# Patient Record
Sex: Male | Born: 1945 | Race: White | Hispanic: No | Smoking: Former smoker
Health system: Southern US, Community
[De-identification: ages and names within clinical notes are randomized; demographics above are authoritative.]

## PROBLEM LIST (undated history)

## (undated) ENCOUNTER — Emergency Department (HOSPITAL_COMMUNITY): Payer: Medicare Other | Source: Home / Self Care

## (undated) DIAGNOSIS — J189 Pneumonia, unspecified organism: Secondary | ICD-10-CM

## (undated) DIAGNOSIS — M1990 Unspecified osteoarthritis, unspecified site: Secondary | ICD-10-CM

## (undated) DIAGNOSIS — I2510 Atherosclerotic heart disease of native coronary artery without angina pectoris: Secondary | ICD-10-CM

## (undated) DIAGNOSIS — I10 Essential (primary) hypertension: Secondary | ICD-10-CM

## (undated) DIAGNOSIS — F419 Anxiety disorder, unspecified: Secondary | ICD-10-CM

## (undated) DIAGNOSIS — E119 Type 2 diabetes mellitus without complications: Secondary | ICD-10-CM

## (undated) DIAGNOSIS — D473 Essential (hemorrhagic) thrombocythemia: Secondary | ICD-10-CM

## (undated) DIAGNOSIS — F329 Major depressive disorder, single episode, unspecified: Secondary | ICD-10-CM

## (undated) DIAGNOSIS — G8929 Other chronic pain: Secondary | ICD-10-CM

## (undated) DIAGNOSIS — J90 Pleural effusion, not elsewhere classified: Secondary | ICD-10-CM

## (undated) DIAGNOSIS — M545 Low back pain, unspecified: Secondary | ICD-10-CM

## (undated) DIAGNOSIS — F32A Depression, unspecified: Secondary | ICD-10-CM

## (undated) DIAGNOSIS — C9590 Leukemia, unspecified not having achieved remission: Secondary | ICD-10-CM

## (undated) DIAGNOSIS — N400 Enlarged prostate without lower urinary tract symptoms: Secondary | ICD-10-CM

## (undated) DIAGNOSIS — Z87442 Personal history of urinary calculi: Secondary | ICD-10-CM

## (undated) DIAGNOSIS — D72829 Elevated white blood cell count, unspecified: Secondary | ICD-10-CM

## (undated) DIAGNOSIS — D75839 Thrombocytosis, unspecified: Secondary | ICD-10-CM

## (undated) DIAGNOSIS — N289 Disorder of kidney and ureter, unspecified: Secondary | ICD-10-CM

## (undated) DIAGNOSIS — K219 Gastro-esophageal reflux disease without esophagitis: Secondary | ICD-10-CM

## (undated) DIAGNOSIS — K2970 Gastritis, unspecified, without bleeding: Secondary | ICD-10-CM

## (undated) DIAGNOSIS — D469 Myelodysplastic syndrome, unspecified: Secondary | ICD-10-CM

## (undated) DIAGNOSIS — I509 Heart failure, unspecified: Secondary | ICD-10-CM

## (undated) DIAGNOSIS — C946 Myelodysplastic disease, not classified: Secondary | ICD-10-CM

## (undated) DIAGNOSIS — N186 End stage renal disease: Secondary | ICD-10-CM

## (undated) DIAGNOSIS — R161 Splenomegaly, not elsewhere classified: Secondary | ICD-10-CM

## (undated) HISTORY — PX: CYSTOSCOPY W/ STONE MANIPULATION: SHX1427

## (undated) HISTORY — PX: BONE MARROW BIOPSY: SHX199

## (undated) HISTORY — PX: BACK SURGERY: SHX140

---
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
## 1976-07-21 HISTORY — PX: LUMBAR DISC SURGERY: SHX700

## 1997-12-29 ENCOUNTER — Inpatient Hospital Stay (HOSPITAL_COMMUNITY): Admission: EM | Admit: 1997-12-29 | Discharge: 1997-12-30 | Payer: Self-pay | Admitting: Cardiology

## 2005-08-28 ENCOUNTER — Ambulatory Visit: Payer: Self-pay | Admitting: Cardiology

## 2006-12-15 ENCOUNTER — Ambulatory Visit: Payer: Self-pay | Admitting: Cardiology

## 2007-06-05 ENCOUNTER — Ambulatory Visit: Payer: Self-pay | Admitting: Cardiology

## 2008-03-26 ENCOUNTER — Emergency Department (HOSPITAL_COMMUNITY): Admission: EM | Admit: 2008-03-26 | Discharge: 2008-03-26 | Payer: Self-pay | Admitting: Emergency Medicine

## 2011-04-23 LAB — CBC
HCT: 36.7 — ABNORMAL LOW
Platelets: 311
RDW: 23.9 — ABNORMAL HIGH

## 2011-04-23 LAB — URINALYSIS, ROUTINE W REFLEX MICROSCOPIC
Ketones, ur: NEGATIVE
Leukocytes, UA: NEGATIVE
Nitrite: NEGATIVE
Specific Gravity, Urine: <1.005 — ABNORMAL LOW
pH: 6

## 2011-04-23 LAB — COMPREHENSIVE METABOLIC PANEL
Albumin: 4.1
Alkaline Phosphatase: 40
BUN: 12
Potassium: 3.9
Total Protein: 5.9 — ABNORMAL LOW

## 2011-04-23 LAB — URINE MICROSCOPIC-ADD ON

## 2011-04-23 LAB — DIFFERENTIAL
Basophils Relative: 1
Lymphocytes Relative: 18
Lymphs Abs: 1.7
Monocytes Absolute: 0.7
Monocytes Relative: 7
Neutro Abs: 6.7

## 2011-05-23 DIAGNOSIS — R079 Chest pain, unspecified: Secondary | ICD-10-CM

## 2011-12-19 ENCOUNTER — Encounter: Payer: Medicare Other | Admitting: Internal Medicine

## 2011-12-19 DIAGNOSIS — D473 Essential (hemorrhagic) thrombocythemia: Secondary | ICD-10-CM

## 2011-12-19 DIAGNOSIS — K7689 Other specified diseases of liver: Secondary | ICD-10-CM

## 2011-12-19 DIAGNOSIS — R161 Splenomegaly, not elsewhere classified: Secondary | ICD-10-CM

## 2012-03-05 ENCOUNTER — Ambulatory Visit (HOSPITAL_COMMUNITY)
Admission: RE | Admit: 2012-03-05 | Discharge: 2012-03-05 | Disposition: A | Discharge status: Home / Self Care | Payer: Medicare Other | Source: Ambulatory Visit | Attending: Emergency Medicine | Admitting: Emergency Medicine

## 2012-03-05 ENCOUNTER — Other Ambulatory Visit (HOSPITAL_COMMUNITY): Payer: Self-pay | Admitting: Emergency Medicine

## 2012-03-05 ENCOUNTER — Encounter (HOSPITAL_COMMUNITY): Payer: Self-pay

## 2012-03-05 DIAGNOSIS — R079 Chest pain, unspecified: Secondary | ICD-10-CM

## 2012-03-05 DIAGNOSIS — R0602 Shortness of breath: Secondary | ICD-10-CM | POA: Insufficient documentation

## 2012-03-05 DIAGNOSIS — R1031 Right lower quadrant pain: Secondary | ICD-10-CM | POA: Insufficient documentation

## 2012-03-05 MED ORDER — IOHEXOL 350 MG/ML SOLN
100.0000 mL | Freq: Once | INTRAVENOUS | Status: AC | PRN
  Administered 2012-03-05: 100 mL via INTRAVENOUS

## 2013-01-18 ENCOUNTER — Encounter: Payer: Self-pay | Admitting: Gastroenterology

## 2013-01-18 ENCOUNTER — Other Ambulatory Visit: Payer: Self-pay | Admitting: Gastroenterology

## 2013-01-18 ENCOUNTER — Ambulatory Visit (INDEPENDENT_AMBULATORY_CARE_PROVIDER_SITE_OTHER): Payer: Medicare Other | Admitting: Gastroenterology

## 2013-01-18 VITALS — BP 145/79 | HR 63 | Temp 97.9°F | Ht 72.0 in | Wt 208.4 lb

## 2013-01-18 DIAGNOSIS — K219 Gastro-esophageal reflux disease without esophagitis: Secondary | ICD-10-CM

## 2013-01-18 DIAGNOSIS — Z1211 Encounter for screening for malignant neoplasm of colon: Secondary | ICD-10-CM

## 2013-01-18 MED ORDER — PEG 3350-KCL-NA BICARB-NACL 420 G PO SOLR: 4000.0000 mL | ORAL | Status: DC

## 2013-01-18 NOTE — Progress Notes (Signed)
Primary Care Physician:  [name], MD Primary Gastroenterologist:  Dr. Oneida Alar   Chief Complaint  Patient presents with  . Colonoscopy    HPI:   [name] is a 67 year old male presenting today for a visit prior to screening colonoscopy. No prior colonoscopy. Notes right-sided abdominal pain, located more to the right of the abdomen, intermittent but usually present every day. Dragging him down. Present for about 2-3 years. Worsened by eating at times. Feels bloated. Can't pinpoint which foods aggravate him. No N/V. Takes Prilosec BID every day. Chronic GERD. History of remote dysphagia, with remote dilation by Dr. Duffy Rhody. No problems since. No melena. No hematochezia. About 3-4 times a month has abdominal cramping followed by loose stool. Lately abdominal pain unrelated to bowel habits. Takes pain medication for back, nothing really relieves abdominal pain. Gallbladder remains in situ.   CBC, TSH, lipid panel, CMP ordered by PCP; patient has not completed yet.   Past Medical History  Diagnosis Date  . Diabetes mellitus   . Hypertension   . Chronic back pain     Past Surgical History  Procedure Laterality Date  . Back surgery      Current Outpatient Prescriptions  Medication Sig Dispense Refill  . ALPRAZolam (XANAX) 0.5 MG tablet Take 0.5 mg by mouth at bedtime as needed.       Marland Kitchen FLUoxetine (PROZAC) 40 MG capsule Take 40 mg by mouth daily.       Marland Kitchen HYDROmorphone (DILAUDID) 4 MG tablet Take 4 mg by mouth every 4 (four) hours as needed.       . metFORMIN (GLUCOPHAGE) 500 MG tablet Take 1,000 mg by mouth 2 (two) times daily with a meal.       . omeprazole (PRILOSEC) 20 MG capsule Take 20 mg by mouth daily.        No current facility-administered medications for this visit.    Allergies as of 01/18/2013  . (No Known Allergies)    Family History  Problem Relation Age of Onset  . Colon cancer Neg Hx     History   Social History  . Marital Status: Divorced   Spouse Name: N/A    Number of Children: N/A  . Years of Education: N/A   Occupational History  . retired     Architect   Social History Main Topics  . Smoking status: Current Some Day Smoker    Types: Cigars  . Smokeless tobacco: Not on file  . 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 Drug Use: No  . Sexually Active: Not on file   Other Topics Concern  . Not on file   Social History Narrative  . No narrative on file    Review of Systems: Negative unless mentioned in HPI.   Physical Exam: BP 145/79  Pulse 63  Temp(Src) 97.9 F (36.6 C) (Oral)  Ht 6' (1.829 m)  Wt 208 lb 6.4 oz (94.53 kg)  BMI 28.26 kg/m2 General:   Alert and oriented. Pleasant and cooperative. Well-nourished and well-developed.  Head:  Normocephalic and atraumatic. Eyes:  Without icterus, sclera clear and conjunctiva pink.  Ears:  Normal auditory acuity. Nose:  No deformity, discharge,  or lesions. Mouth:  No deformity or lesions, oral mucosa pink.  Neck:  Supple, without mass or thyromegaly. Lungs:  Clear to auscultation bilaterally. No wheezes, rales, or rhonchi. No distress.  Heart:  S1, S2 present without murmurs  appreciated.  Abdomen:  +BS, soft, non-tender and non-distended. No HSM noted. No guarding or rebound. No masses appreciated.  Rectal:  Deferred  Msk:  Symmetrical without gross deformities. Normal posture. Extremities:  Without clubbing or edema. Neurologic:  Alert and  oriented x4;  grossly normal neurologically. Skin:  Intact without significant lesions or rashes. Cervical Nodes:  No significant cervical adenopathy. Psych:  Alert and cooperative. Normal mood and affect.

## 2013-01-18 NOTE — Patient Instructions (Addendum)
Please complete the blood work ordered by Dr. Manuella Ghazi.   We have scheduled you for a colonoscopy and upper endoscopy with Dr. Oneida Alar in the near future.  Further blood work and imaging may be necessary once these procedures are done.

## 2013-01-19 ENCOUNTER — Encounter (HOSPITAL_COMMUNITY): Payer: Self-pay

## 2013-01-21 DIAGNOSIS — K219 Gastro-esophageal reflux disease without esophagitis: Secondary | ICD-10-CM | POA: Insufficient documentation

## 2013-01-21 DIAGNOSIS — Z86010 Personal history of colonic polyps: Secondary | ICD-10-CM | POA: Insufficient documentation

## 2013-01-21 DIAGNOSIS — Z860101 Personal history of adenomatous and serrated colon polyps: Secondary | ICD-10-CM | POA: Insufficient documentation

## 2013-01-21 NOTE — Assessment & Plan Note (Signed)
67 year old male presenting with need for initial screening colonoscopy, reporting chronic right-sided abdominal discomfort for the past few years. Associated with bloating, at times exacerbated by eating. No significant change in bowel habits, although he does relate symptoms likely secondary to IBS to include occasional abdominal cramping and loose stool several times a month. No rectal bleeding. With his chronic back pain, question referred pain, IBS, less likely biliary etiology. Will proceed with colonoscopy and upper endoscopy in the near future. With his history of chronic GERD and vague RUQ discomfort, EGD will be performed to screen for Barrett's.   Proceed with colonoscopy/EGD with Dr. Oneida Alar in the near future. The risks, benefits, and alternatives have been discussed in detail with the patient. They state understanding and desire to proceed.  PROPOFOL due to chronic narcotics Obtain outside labs from PCP Consider [place] of abdomen if negative TCS/EGD

## 2013-01-21 NOTE — Assessment & Plan Note (Signed)
Continue BID PPI. EGD for Barrett's screening and chronic RUQ pain.

## 2013-01-24 NOTE — Progress Notes (Signed)
Cc PCP 

## 2013-01-24 NOTE — Patient Instructions (Addendum)
[name]  01/24/2013   Your procedure is scheduled on:  02/01/2013  Report to [facility] at  615  AM.  Call this number if you have problems the morning of surgery: [phone]   Remember:   Do not eat food or drink liquids after midnight.   Take these medicines the morning of surgery with A SIP OF WATER:  Xanax, prozac, dilaudid, prilosec   Do not wear jewelry, make-up or nail polish.  Do not wear lotions, powders, or perfumes.  Do not shave 48 hours prior to surgery. Men may shave face and neck.  Do not bring valuables to the hospital.  [facility] is not responsible for any belongings or valuables.  Contacts, dentures or bridgework may not be worn into surgery.  Leave suitcase in the car. After surgery it may be brought to your room.  For patients admitted to the hospital, checkout time is 11:00 AM the day of discharge.   Patients discharged the day of surgery will not be allowed to drive home.  Name and phone number of your driver: family  Special Instructions: N/A   Please read over the following fact sheets that you were given: Pain Booklet, Coughing and Deep Breathing, MRSA Information, Surgical Site Infection Prevention, Anesthesia Post-op Instructions and Care and Recovery After Surgery Esophagogastroduodenoscopy Esophagogastroduodenoscopy (EGD) is a procedure to examine the lining of the esophagus, stomach, and first part of the small intestine (duodenum). A long, flexible, lighted tube with a camera attached (endoscope) is inserted down the throat to view these organs. This procedure is done to detect problems or abnormalities, such as inflammation, bleeding, ulcers, or growths, in order to treat them. The procedure lasts about 5 20 minutes. It is usually an outpatient procedure, but it may need to be performed in emergency cases in the hospital. LET YOUR CAREGIVER KNOW ABOUT:   Allergies to food or medicine.  All medicines you are taking, including vitamins,  herbs, eyedrops, and over-the-counter medicines and creams.  Use of steroids (by mouth or creams).  Previous problems you or members of your family have had with the use of anesthetics.  Any blood disorders you have.  Previous surgeries you have had.  Other health problems you have.  Possibility of pregnancy, if this applies. RISKS AND COMPLICATIONS  Generally, EGD is a safe procedure. However, as with any procedure, complications can occur. Possible complications include:  Infection.  Bleeding.  Tearing (perforation) of the esophagus, stomach, or duodenum.  Difficulty breathing or not being able to breath.  Excessive sweating.  Spasms of the larynx.  Slowed heartbeat.  Low blood pressure. BEFORE THE PROCEDURE  Do not eat or drink anything for 6 8 hours before the procedure or as directed by your caregiver.  Ask your caregiver about changing or stopping your regular medicines.  If you wear dentures, be prepared to remove them before the procedure.  Arrange for someone to drive you home after the procedure. PROCEDURE   A vein will be accessed to give medicines and fluids. A medicine to relax you (sedative) and a pain reliever will be given through that access into the vein.  A numbing medicine (local anesthetic) may be sprayed on your throat for comfort and to stop you from gagging or coughing.  A mouth guard may be placed in your mouth to protect your teeth and to keep you from biting on the endoscope.  You will be asked to lie on your left  side.  The endoscope is inserted down your throat and into the esophagus, stomach, and duodenum.  Air is put through the endoscope to allow your caregiver to view the lining of your esophagus clearly.  The esophagus, stomach, and duodenum is then examined. During the exam, your caregiver may:  Remove tissue to be examined under a microscope (biopsy) for inflammation, infection, or other medical problems.  Remove  growths.  Remove objects (foreign bodies) that are stuck.  Treat any bleeding with medicines or other devices that stop tissues from bleeding (hot cauters, clipping devices).  Widen (dilate) or stretch narrowed areas of the esophagus and stomach.  The endoscope will then be withdrawn. AFTER THE PROCEDURE  You will be taken to a recovery area to be monitored. You will be able to go home once you are stable and alert.  Do not eat or drink anything until the local anesthetic and numbing medicines have worn off. You may choke.  It is normal to feel bloated, have pain with swallowing, or have a sore throat for a short time. This will wear off.  Your caregiver should be able to discuss his or her findings with you. It will take longer to discuss the test results if any biopsies were taken. Document Released: 11/07/2004 Document Revised: 06/23/2012 Document Reviewed: 06/09/2012 [facility] Patient Information 2014 [place], [place]. Colonoscopy A colonoscopy is an exam to evaluate your entire colon. In this exam, your colon is cleansed. A long fiberoptic tube is inserted through your rectum and into your colon. The fiberoptic scope (endoscope) is a long bundle of enclosed and very flexible fibers. These fibers transmit light to the area examined and send images from that area to your caregiver. Discomfort is usually minimal. You may be given a drug to help you sleep (sedative) during or prior to the procedure. This exam helps to detect lumps (tumors), polyps, inflammation, and areas of bleeding. Your caregiver may also take a small piece of tissue (biopsy) that will be examined under a microscope. LET YOUR CAREGIVER KNOW ABOUT:   Allergies to food or medicine.  Medicines taken, including vitamins, herbs, eyedrops, over-the-counter medicines, and creams.  Use of steroids (by mouth or creams).  Previous problems with anesthetics or numbing medicines.  History of bleeding problems or blood  clots.  Previous surgery.  Other health problems, including diabetes and kidney problems.  Possibility of pregnancy, if this applies. BEFORE THE PROCEDURE   A clear liquid diet may be required for 2 days before the exam.  Ask your caregiver about changing or stopping your regular medications.  Liquid injections (enemas) or laxatives may be required.  A large amount of electrolyte solution may be given to you to drink over a short period of time. This solution is used to clean out your colon.  You should be present 60 minutes prior to your procedure or as directed by your caregiver. AFTER THE PROCEDURE   If you received a sedative or pain relieving medication, you will need to arrange for someone to drive you home.  Occasionally, there is a little blood passed with the first bowel movement. Do not be concerned. FINDING OUT THE RESULTS OF YOUR TEST Not all test results are available during your visit. If your test results are not back during the visit, make an appointment with your caregiver to find out the results. Do not assume everything is normal if you have not heard from your caregiver or the medical facility. It is important for you to follow  up on all of your test results. HOME CARE INSTRUCTIONS   It is not unusual to pass moderate amounts of gas and experience mild abdominal cramping following the procedure. This is due to air being used to inflate your colon during the exam. Walking or a warm pack on your belly (abdomen) may help.  You may resume all normal meals and activities after sedatives and medicines have worn off.  Only take over-the-counter or prescription medicines for pain, discomfort, or fever as directed by your caregiver. Do not use aspirin or blood thinners if a biopsy was taken. Consult your caregiver for medicine usage if biopsies were taken. SEEK IMMEDIATE MEDICAL CARE IF:   You have a fever.  You pass large blood clots or fill a toilet with blood  following the procedure. This may also occur 10 to 14 days following the procedure. This is more likely if a biopsy was taken.  You develop abdominal pain that keeps getting worse and cannot be relieved with medicine. Document Released: 07/04/2000 Document Revised: 09/29/2011 Document Reviewed: 02/17/2008 [facility] Patient Information 2014 [place]. PATIENT INSTRUCTIONS POST-ANESTHESIA  IMMEDIATELY FOLLOWING SURGERY:  Do not drive or operate machinery for the first twenty four hours after surgery.  Do not make any important decisions for twenty four hours after surgery or while taking narcotic pain medications or sedatives.  If you develop intractable nausea and vomiting or a severe headache please notify your doctor immediately.  FOLLOW-UP:  Please make an appointment with your surgeon as instructed. You do not need to follow up with anesthesia unless specifically instructed to do so.  WOUND CARE INSTRUCTIONS (if applicable):  Keep a dry clean dressing on the anesthesia/puncture wound site if there is drainage.  Once the wound has quit draining you may leave it open to air.  Generally you should leave the bandage intact for twenty four hours unless there is drainage.  If the epidural site drains for more than 36-48 hours please call the anesthesia department.  QUESTIONS?:  Please feel free to call your physician or the hospital operator if you have any questions, and they will be happy to assist you.

## 2013-01-25 ENCOUNTER — Other Ambulatory Visit: Payer: Self-pay

## 2013-01-25 ENCOUNTER — Encounter (HOSPITAL_COMMUNITY): Payer: Self-pay

## 2013-01-25 ENCOUNTER — Encounter (HOSPITAL_COMMUNITY)
Admission: RE | Admit: 2013-01-25 | Discharge: 2013-01-25 | Disposition: A | Discharge status: Home / Self Care | Payer: Medicare Other | Source: Ambulatory Visit | Attending: Gastroenterology | Admitting: Gastroenterology

## 2013-01-25 HISTORY — DX: Anxiety disorder, unspecified: F41.9

## 2013-01-25 HISTORY — DX: Gastro-esophageal reflux disease without esophagitis: K21.9

## 2013-01-25 LAB — BASIC METABOLIC PANEL
CO2: 32 mEq/L (ref 19–32)
Chloride: 105 mEq/L (ref 96–112)
Potassium: 4.9 mEq/L (ref 3.5–5.1)
Sodium: 141 mEq/L (ref 135–145)

## 2013-01-25 LAB — HEMOGLOBIN AND HEMATOCRIT, BLOOD: HCT: 34.6 % — ABNORMAL LOW (ref 39.0–52.0)

## 2013-01-26 ENCOUNTER — Telehealth: Payer: Self-pay | Admitting: Gastroenterology

## 2013-01-26 NOTE — Telephone Encounter (Signed)
PLEASE CALL PT. HER BLOOD COUNT SHOWS A LOW BLOOD COUNT(13) AND HIS KIDNEY FUNCTION IS NOT NORMAL. THE NUMBER IS WORSE THAN WHEN HE LAST HAD HIS KIDNEY FUNCTION CHECKED. HIS BLOOD SUGAR IS ELEVATED AS WELL. HE SHOULD SEE HIS PCP AFTER HIS ENDOSCOPY IS COMPLETE TO RE-EVALUATE IF ANYTHING ELSE NEEDS TO BE DONE ABOUT HIS KIDNEY FUNCTION AND BLOOD SUGAR.

## 2013-02-01 ENCOUNTER — Encounter (HOSPITAL_COMMUNITY): Payer: Self-pay | Admitting: Anesthesiology

## 2013-02-01 ENCOUNTER — Ambulatory Visit (HOSPITAL_COMMUNITY)
Admission: RE | Admit: 2013-02-01 | Discharge: 2013-02-01 | Disposition: A | Discharge status: Home / Self Care | Payer: Medicare Other | Source: Ambulatory Visit | Attending: Gastroenterology | Admitting: Gastroenterology

## 2013-02-01 ENCOUNTER — Encounter (HOSPITAL_COMMUNITY)
Admission: RE | Disposition: A | Discharge status: Home / Self Care | Payer: Self-pay | Source: Ambulatory Visit | Attending: Gastroenterology

## 2013-02-01 ENCOUNTER — Encounter (HOSPITAL_COMMUNITY): Payer: Self-pay | Admitting: *Deleted

## 2013-02-01 ENCOUNTER — Ambulatory Visit (HOSPITAL_COMMUNITY): Payer: Medicare Other | Admitting: Anesthesiology

## 2013-02-01 DIAGNOSIS — Z1211 Encounter for screening for malignant neoplasm of colon: Secondary | ICD-10-CM

## 2013-02-01 DIAGNOSIS — D126 Benign neoplasm of colon, unspecified: Secondary | ICD-10-CM

## 2013-02-01 DIAGNOSIS — D128 Benign neoplasm of rectum: Secondary | ICD-10-CM | POA: Insufficient documentation

## 2013-02-01 DIAGNOSIS — K2960 Other gastritis without bleeding: Secondary | ICD-10-CM

## 2013-02-01 DIAGNOSIS — K2270 Barrett's esophagus without dysplasia: Secondary | ICD-10-CM | POA: Insufficient documentation

## 2013-02-01 DIAGNOSIS — Z01812 Encounter for preprocedural laboratory examination: Secondary | ICD-10-CM | POA: Insufficient documentation

## 2013-02-01 DIAGNOSIS — E119 Type 2 diabetes mellitus without complications: Secondary | ICD-10-CM | POA: Insufficient documentation

## 2013-02-01 DIAGNOSIS — I10 Essential (primary) hypertension: Secondary | ICD-10-CM | POA: Insufficient documentation

## 2013-02-01 DIAGNOSIS — K648 Other hemorrhoids: Secondary | ICD-10-CM | POA: Insufficient documentation

## 2013-02-01 DIAGNOSIS — K2970 Gastritis, unspecified, without bleeding: Secondary | ICD-10-CM | POA: Insufficient documentation

## 2013-02-01 DIAGNOSIS — K5730 Diverticulosis of large intestine without perforation or abscess without bleeding: Secondary | ICD-10-CM

## 2013-02-01 DIAGNOSIS — Z01810 Encounter for preprocedural cardiovascular examination: Secondary | ICD-10-CM | POA: Insufficient documentation

## 2013-02-01 HISTORY — PX: POLYPECTOMY: SHX5525

## 2013-02-01 HISTORY — PX: ESOPHAGOGASTRODUODENOSCOPY (EGD) WITH PROPOFOL: SHX5813

## 2013-02-01 HISTORY — PX: COLONOSCOPY WITH PROPOFOL: SHX5780

## 2013-02-01 LAB — GLUCOSE, CAPILLARY
Glucose-Capillary: 138 mg/dL — ABNORMAL HIGH (ref 70–99)
Glucose-Capillary: 126 mg/dL — ABNORMAL HIGH (ref 70–99)

## 2013-02-01 SURGERY — COLONOSCOPY WITH PROPOFOL
Anesthesia: Monitor Anesthesia Care

## 2013-02-01 MED ORDER — MIDAZOLAM HCL 2 MG/2ML IJ SOLN
INTRAMUSCULAR | Status: AC
  Filled 2013-02-01: qty 2

## 2013-02-01 MED ORDER — FENTANYL CITRATE 0.05 MG/ML IJ SOLN
INTRAMUSCULAR | Status: AC
  Filled 2013-02-01: qty 2

## 2013-02-01 MED ORDER — GLYCOPYRROLATE 0.2 MG/ML IJ SOLN
0.2000 mg | Freq: Once | INTRAMUSCULAR | Status: AC
  Administered 2013-02-01: 0.2 mg via INTRAVENOUS

## 2013-02-01 MED ORDER — PROPOFOL 10 MG/ML IV EMUL
INTRAVENOUS | Status: AC
  Filled 2013-02-01: qty 20

## 2013-02-01 MED ORDER — LACTATED RINGERS IV SOLN
INTRAVENOUS | Status: DC
  Administered 2013-02-01: 1000 mL via INTRAVENOUS

## 2013-02-01 MED ORDER — ONDANSETRON HCL 4 MG/2ML IJ SOLN
INTRAMUSCULAR | Status: AC
  Filled 2013-02-01: qty 2

## 2013-02-01 MED ORDER — FENTANYL CITRATE 0.05 MG/ML IJ SOLN
25.0000 ug | INTRAMUSCULAR | Status: AC
  Administered 2013-02-01 (×2): 25 ug via INTRAVENOUS

## 2013-02-01 MED ORDER — LIDOCAINE HCL (CARDIAC) 10 MG/ML IV SOLN
INTRAVENOUS | Status: DC | PRN
  Administered 2013-02-01: 20 mg via INTRAVENOUS

## 2013-02-01 MED ORDER — STERILE WATER FOR IRRIGATION IR SOLN
Status: DC | PRN
  Administered 2013-02-01: 08:00:00

## 2013-02-01 MED ORDER — PROPOFOL INFUSION 10 MG/ML OPTIME
INTRAVENOUS | Status: DC | PRN
  Administered 2013-02-01: 60 ug/kg/min via INTRAVENOUS
  Administered 2013-02-01: 100 ug/kg/min via INTRAVENOUS

## 2013-02-01 MED ORDER — WATER FOR IRRIGATION, STERILE IR SOLN
Status: DC | PRN
  Administered 2013-02-01: 1000 mL

## 2013-02-01 MED ORDER — BUTAMBEN-TETRACAINE-BENZOCAINE 2-2-14 % EX AERO
1.0000 | INHALATION_SPRAY | Freq: Once | CUTANEOUS | Status: AC
  Administered 2013-02-01: 1 via TOPICAL
  Filled 2013-02-01: qty 56

## 2013-02-01 MED ORDER — FENTANYL CITRATE 0.05 MG/ML IJ SOLN
INTRAMUSCULAR | Status: DC | PRN
  Administered 2013-02-01: 50 ug via INTRAVENOUS

## 2013-02-01 MED ORDER — FENTANYL CITRATE 0.05 MG/ML IJ SOLN
25.0000 ug | INTRAMUSCULAR | Status: DC | PRN
  Administered 2013-02-01 (×2): 50 ug via INTRAVENOUS

## 2013-02-01 MED ORDER — LIDOCAINE HCL (PF) 1 % IJ SOLN
INTRAMUSCULAR | Status: AC
  Filled 2013-02-01: qty 5

## 2013-02-01 MED ORDER — GLYCOPYRROLATE 0.2 MG/ML IJ SOLN
INTRAMUSCULAR | Status: AC
  Filled 2013-02-01: qty 1

## 2013-02-01 MED ORDER — ONDANSETRON HCL 4 MG/2ML IJ SOLN
4.0000 mg | Freq: Once | INTRAMUSCULAR | Status: AC
  Administered 2013-02-01: 4 mg via INTRAVENOUS

## 2013-02-01 MED ORDER — MIDAZOLAM HCL 2 MG/2ML IJ SOLN
1.0000 mg | INTRAMUSCULAR | Status: DC | PRN
  Administered 2013-02-01 (×2): 2 mg via INTRAVENOUS

## 2013-02-01 MED ORDER — ONDANSETRON HCL 4 MG/2ML IJ SOLN: 4.0000 mg | Freq: Once | INTRAMUSCULAR | Status: DC | PRN

## 2013-02-01 SURGICAL SUPPLY — 24 items
BLOCK BITE 60FR ADLT L/F BLUE (MISCELLANEOUS) ×1 IMPLANT
ELECT REM PT RETURN 9FT ADLT (ELECTROSURGICAL) ×2
ELECTRODE REM PT RTRN 9FT ADLT (ELECTROSURGICAL) IMPLANT
FCP BXJMBJMB 240X2.8X (CUTTING FORCEPS)
FLOOR PAD 36X40 (MISCELLANEOUS) ×2
FORCEPS BIOP RAD 4 LRG CAP 4 (CUTTING FORCEPS) IMPLANT
FORCEPS BIOP RJ4 240 W/NDL (CUTTING FORCEPS)
FORCEPS BXJMBJMB 240X2.8X (CUTTING FORCEPS) IMPLANT
FORMALIN 10 PREFIL 120ML (MISCELLANEOUS) ×2 IMPLANT
INJECTOR/SNARE I SNARE (MISCELLANEOUS) IMPLANT
LUBRICANT JELLY 4.5OZ STERILE (MISCELLANEOUS) ×1 IMPLANT
MANIFOLD NEPTUNE II (INSTRUMENTS) ×3 IMPLANT
NDL SCLEROTHERAPY 25GX240 (NEEDLE) IMPLANT
NEEDLE SCLEROTHERAPY 25GX240 (NEEDLE) IMPLANT
PAD FLOOR 36X40 (MISCELLANEOUS) ×1 IMPLANT
PROBE APC STR FIRE (PROBE) IMPLANT
PROBE INJECTION GOLD (MISCELLANEOUS)
PROBE INJECTION GOLD 7FR (MISCELLANEOUS) IMPLANT
SNARE SHORT THROW 13M SML OVAL (MISCELLANEOUS) ×2 IMPLANT
SYR 50ML LL SCALE MARK (SYRINGE) ×1 IMPLANT
TRAP SPECIMEN MUCOUS 40CC (MISCELLANEOUS) ×2 IMPLANT
TUBING ENDO SMARTCAP PENTAX (MISCELLANEOUS) IMPLANT
TUBING IRRIGATION ENDOGATOR (MISCELLANEOUS) ×1 IMPLANT
WATER STERILE IRR 1000ML POUR (IV SOLUTION) ×1 IMPLANT

## 2013-02-01 NOTE — Op Note (Signed)
[facility] [address] [place], [address]   COLONOSCOPY PROCEDURE REPORT  PATIENT: [name], [name]  MR#: [id number] BIRTHDATE: 1946/02/05 , 60  yrs. old GENDER: Male ENDOSCOPIST: Barney Drain, MD REFERRED [id number] Manuella Ghazi, M.D. PROCEDURE DATE:  02/01/2013 PROCEDURE:   ILEOColonoscopy with snare polypectomy and with cold biopsy polypectomy INDICATIONS:Average risk patient for colon cancer. MEDICATIONS: MAC sedation, administered by CRNA  DESCRIPTION OF PROCEDURE:    Physical exam was performed.  Informed consent was obtained from the patient after explaining the benefits, risks, and alternatives to procedure.  The patient was connected to monitor and placed in left lateral position. Continuous oxygen was provided by nasal cannula and IV medicine administered through an indwelling cannula.  After administration of sedation and rectal exam, the patients rectum was intubated and the     colonoscope was advanced under direct visualization to the ileum.  The scope was removed slowly by carefully examining the color, texture, anatomy, and integrity mucosa on the way out.  The patient was recovered in endoscopy and discharged home in satisfactory condition.    COLON FINDINGS: Two sessile polyps measuring 4-8 mm in size were found in the transverse colon.  ONE APPEARED SERRATED AND WAS NOT RETRIEVED. A polypectomy was performed with a cold snare and using snare cautery.  , A sessile polyp measuring 8 mm in size was found in the rectum.  A polypectomy was performed using snare cautery. The resection was complete and the polyp tissue was completely retrieved, Mild diverticulosis was noted in the sigmoid colon.  , Small internal hemorrhoids were found.  , and The colon IS redundant.  Manual abdominal counter-pressure was used to reach the cecum.  PREP QUALITY: good.  CECAL W/D TIME: 19 minutes     COMPLICATIONS: None  ENDOSCOPIC IMPRESSION: 1.   23 COLON POLYPS  REMOVED. 2 RETRIEVED. 2.   Mild diverticulosis in the sigmoid colon 3.   Small internal hemorrhoids 4.   The colon IS redundant  RECOMMENDATIONS: AWAIT BIOPSY HIGH FIBER DIET TCS IN 3 YEARS IF ADVANCED AND 5 YEARS IF SIMPLE ADENOMAS       _______________________________ Lorrin MaisBarney Drain, MD 02/01/2013 8:48 AM     PATIENT NAME:  [name], [name] MR#: [id number]

## 2013-02-01 NOTE — Anesthesia Preprocedure Evaluation (Signed)
Anesthesia Evaluation  Patient identified by MRN, date of birth, ID band Patient awake    Reviewed: Allergy & Precautions, H&P , NPO status , Patient's Chart, lab work & pertinent test results  Airway Mallampati: I TM Distance: >3 FB Neck ROM: Full    Dental  (+) Edentulous Upper   Pulmonary Current Smoker,  breath sounds clear to auscultation        Cardiovascular hypertension, Pt. on medications Rhythm:Regular Rate:Normal     Neuro/Psych PSYCHIATRIC DISORDERS Anxiety    GI/Hepatic GERD-  Medicated,  Endo/Other  diabetes, Well Controlled, Type 2, Oral Hypoglycemic Agents  Renal/GU      Musculoskeletal   Abdominal   Peds  Hematology   Anesthesia Other Findings   Reproductive/Obstetrics                           Anesthesia Physical Anesthesia Plan  ASA: III  Anesthesia Plan: MAC   Post-op Pain Management:    Induction: Intravenous  Airway Management Planned: Simple Face Mask  Additional Equipment:   Intra-op Plan:   Post-operative Plan:   Informed Consent: I have reviewed the patients History and Physical, chart, labs and discussed the procedure including the risks, benefits and alternatives for the proposed anesthesia with the patient or authorized representative who has indicated his/her understanding and acceptance.     Plan Discussed with:   Anesthesia Plan Comments:         Anesthesia Quick Evaluation

## 2013-02-01 NOTE — Anesthesia Postprocedure Evaluation (Signed)
Anesthesia Post Note  Patient: [name]  Procedure(s) Performed: Procedure(s) (LRB): COLONOSCOPY WITH PROPOFOL (N/A) ESOPHAGOGASTRODUODENOSCOPY (EGD) WITH PROPOFOL, GASTRIC BIOPSIES (N/A) POLYPECTOMY (N/A)  Anesthesia type: MAC  Patient location: PACU  Post pain: Pain level moderate Back pain reported  Post assessment: Post-op Vital signs reviewed, Patient's Cardiovascular Status Stable, Respiratory Function Stable, Patent Airway, No signs of Nausea or vomiting and Pain level controlled   Post vital signs: Reviewed and stable  Level of consciousness: awake and alert   Complications: No apparent anesthesia complications

## 2013-02-01 NOTE — Anesthesia Procedure Notes (Signed)
Procedure Name: MAC Date/Time: 02/01/2013 7:40 AM Performed by: Vista Deck Pre-anesthesia Checklist: Patient identified, Emergency Drugs available, Suction available, Timeout performed and Patient being monitored Patient Re-evaluated:Patient Re-evaluated prior to inductionOxygen Delivery Method: Non-rebreather mask

## 2013-02-01 NOTE — Discharge Instructions (Signed)
You have REFLUX. YOU DO NOT HAVE BARRETT'S ESOPHAGUS. YOUR UPPER ENDOSCOPY SHOWED GASTRITIS. YOU HAD 3 POLYPs REMOVED. You have  internal hemorrhoids & diverticulosis in your left colon. I biopsied your stomach.   CONTINUE OMEPRAZOLE 30 MINUTES PRIOR TO MEALS ONCE DAILY FOREVER.  AVOID ITEMS THAT TRIGGER GASTRITIS. SEE INFO BELOW.  FOLLOW A HIGH FIBER/LOW FAT DIET. AVOID ITEMS THAT CAUSE BLOATING. SEE INFO BELOW.  DRINK ENOUGH WATER TO KEEP YOUR URINE LIGHT YELLOW.  LOSE 10 LBS. IT WILL HELP YOUR REFLUX BE BETTER CONTROLLED.   YOUR BIOPSY RESULTS WILL BE BACK IN 7 DAYS.  FOLLOW UP IN 4 MOS.  NEXT COLONOSCOPY IN 3-5 YEARS.   ENDOSCOPY Care After Read the instructions outlined below and refer to this sheet in the next week. These discharge instructions provide you with general information on caring for yourself after you leave the hospital. While your treatment has been planned according to the most current medical practices available, unavoidable complications occasionally occur. If you have any problems or questions after discharge, call DR. Fordyce Lepak, [phone].  ACTIVITY  You may resume your regular activity, but move at a slower pace for the next 24 hours.   Take frequent rest periods for the next 24 hours.   Walking will help get rid of the air and reduce the bloated feeling in your belly (abdomen).   No driving for 24 hours (because of the medicine (anesthesia) used during the test).   You may shower.   Do not sign any important legal documents or operate any machinery for 24 hours (because of the anesthesia used during the test).    NUTRITION  Drink plenty of fluids.   You may resume your normal diet as instructed by your doctor.   Begin with a light meal and progress to your normal diet. Heavy or fried foods are harder to digest and may make you feel sick to your stomach (nauseated).   Avoid alcoholic beverages for 24 hours or as instructed.     MEDICATIONS  You may resume your normal medications.   WHAT YOU CAN EXPECT TODAY  Some feelings of bloating in the abdomen.   Passage of more gas than usual.   Spotting of blood in your stool or on the toilet paper  .  IF YOU HAD POLYPS REMOVED DURING THE ENDOSCOPY:  Eat a soft diet IF YOU HAVE NAUSEA, BLOATING, ABDOMINAL PAIN, OR VOMITING.    FINDING OUT THE RESULTS OF YOUR TEST Not all test results are available during your visit. DR. Oneida Alar WILL CALL YOU WITHIN 7 DAYS OF YOUR PROCEDUE WITH YOUR RESULTS. Do not assume everything is normal if you have not heard from DR. Dyana Magner IN ONE WEEK, CALL HER OFFICE AT [phone].  SEEK IMMEDIATE MEDICAL ATTENTION AND CALL THE OFFICE: [phone] IF:  You have more than a spotting of blood in your stool.   Your belly is swollen (abdominal distention).   You are nauseated or vomiting.   You have a temperature over 101F.   You have abdominal pain or discomfort that is severe or gets worse throughout the day.  Polyps, Colon  A polyp is extra tissue that grows inside your body. Colon polyps grow in the large intestine. The large intestine, also called the colon, is part of your digestive system. It is a long, hollow tube at the end of your digestive tract where your body makes and stores stool. Most polyps are not dangerous. They are benign. This means they are not cancerous.  But over time, some types of polyps can turn into cancer. Polyps that are smaller than a pea are usually not harmful. But larger polyps could someday become or may already be cancerous. To be safe, doctors remove all polyps and test them.   WHO GETS POLYPS? Anyone can get polyps, but certain people are more likely than others. You may have a greater chance of getting polyps if:  You are over 50.   You have had polyps before.   Someone in your family has had polyps.   Someone in your family has had cancer of the large intestine.   Find out if someone  in your family has had polyps. You may also be more likely to get polyps if you:   Eat a lot of fatty foods   Smoke   Drink alcohol   Do not exercise  Eat too much   TREATMENT  The caregiver will remove the polyp during sigmoidoscopy or colonoscopy.  PREVENTION There is not one sure way to prevent polyps. You might be able to lower your risk of getting them if you:  Eat more fruits and vegetables and less fatty food.   Do not smoke.   Avoid alcohol.   Exercise every day.   Lose weight if you are overweight.   Eating more calcium and folate can also lower your risk of getting polyps. Some foods that are rich in calcium are milk, cheese, and broccoli. Some foods that are rich in folate are chickpeas, kidney beans, and spinach.    Gastritis  Gastritis is an inflammation (the body's way of reacting to injury and/or infection) of the stomach. It is often caused by bacterial (germ) infections. It can also be caused BY ASPIRIN, BC/GOODY POWDER'S, (IBUPROFEN) MOTRIN, OR ALEVE (NAPROXEN), chemicals (including alcohol), SPICY FOODS, and medications. This illness may be associated with generalized malaise (feeling tired, not well), UPPER ABDOMINAL STOMACH cramps, and fever. One common bacterial cause of gastritis is an organism known as H. Pylori. This can be treated with antibiotics.     REFLUX(HEARTBURN/INDIGESTION)  SYMPTOMS Common symptoms of GERD are heartburn (burning in your chest). This is worse when lying down or bending over. It may also cause belching and indigestion. Some of the things which make GERD worse are:  Increased weight pushes on stomach making acid rise more easily.   Smoking markedly increases acid production.   Alcohol decreases lower esophageal sphincter pressure (valve between stomach and esophagus), allowing acid from stomach into esophagus.   Late evening meals and going to bed with a full stomach increases pressure.    HOME CARE INSTRUCTIONS  Try to  achieve and maintain an ideal body weight.   Avoid drinking alcoholic beverages.   DO NOT smokE.   Do not wear tight clothing around your chest or stomach.   Eat smaller meals and eat more frequently. This keeps your stomach from getting too full. Eat slowly.   Do not lie down for 2 or 3 hours after eating. Do not eat or drink anything 1 to 2 hours before going to bed.   Avoid caffeine beverages (colas, coffee, cocoa, tea), fatty foods, citrus fruits and all other foods and drinks that contain acid and that seem to increase the problems.   Avoid bending over, especially after eating OR STRAINING. Anything that increases the pressure in your belly increases the amount of acid that may be pushed up into your esophagus.   High-Fiber Diet A high-fiber diet changes your normal diet  to include more whole grains, legumes, fruits, and vegetables. Changes in the diet involve replacing refined carbohydrates with unrefined foods. The calorie level of the diet is essentially unchanged. The Dietary Reference Intake (recommended amount) for adult males is 38 grams per day. For adult females, it is 25 grams per day. Pregnant and lactating women should consume 28 grams of fiber per day. Fiber is the intact part of a plant that is not broken down during digestion. Functional fiber is fiber that has been isolated from the plant to provide a beneficial effect in the body. PURPOSE  Increase stool bulk.   Ease and regulate bowel movements.   Lower cholesterol.  INDICATIONS THAT YOU NEED MORE FIBER  Constipation and hemorrhoids.   Uncomplicated diverticulosis (intestine condition) and irritable bowel syndrome.   Weight management.   As a protective measure against hardening of the arteries (atherosclerosis), diabetes, and cancer.   GUIDELINES FOR INCREASING FIBER IN THE DIET  Start adding fiber to the diet slowly. A gradual increase of about 5 more grams (2 slices of whole-wheat bread, 2 servings of  most fruits or vegetables, or 1 bowl of high-fiber cereal) per day is best. Too rapid an increase in fiber may result in constipation, flatulence, and bloating.   Drink enough water and fluids to keep your urine clear or pale yellow. Water, juice, or caffeine-free drinks are recommended. Not drinking enough fluid may cause constipation.   Eat a variety of high-fiber foods rather than one type of fiber.   Try to increase your intake of fiber through using high-fiber foods rather than fiber pills or supplements that contain small amounts of fiber.   The goal is to change the types of food eaten. Do not supplement your present diet with high-fiber foods, but replace foods in your present diet.  INCLUDE A VARIETY OF FIBER SOURCES  Replace refined and processed grains with whole grains, canned fruits with fresh fruits, and incorporate other fiber sources. White rice, white breads, and most bakery goods contain little or no fiber.   Brown whole-grain rice, buckwheat oats, and many fruits and vegetables are all good sources of fiber. These include: broccoli, Brussels sprouts, cabbage, cauliflower, beets, sweet potatoes, white potatoes (skin on), carrots, tomatoes, eggplant, squash, berries, fresh fruits, and dried fruits.   Cereals appear to be the richest source of fiber. Cereal fiber is found in whole grains and bran. Bran is the fiber-rich outer coat of cereal grain, which is largely removed in refining. In whole-grain cereals, the bran remains. In breakfast cereals, the largest amount of fiber is found in those with "bran" in their names. The fiber content is sometimes indicated on the label.   You may need to include additional fruits and vegetables each day.   In baking, for 1 cup white flour, you may use the following substitutions:   1 cup whole-wheat flour minus 2 tablespoons.   1/2 cup white flour plus 1/2 cup whole-wheat flour.   Low-Fat Diet BREADS, CEREALS, PASTA, RICE, DRIED PEAS,  AND BEANS These products are high in carbohydrates and most are low in fat. Therefore, they can be increased in the diet as substitutes for fatty foods. They too, however, contain calories and should not be eaten in excess. Cereals can be eaten for snacks as well as for breakfast.  Include foods that contain fiber (fruits, vegetables, whole grains, and legumes). Research shows that fiber may lower blood cholesterol levels, especially the water-soluble fiber found in fruits, vegetables, oat products, and  legumes. FRUITS AND VEGETABLES It is good to eat fruits and vegetables. Besides being sources of fiber, both are rich in vitamins and some minerals. They help you get the daily allowances of these nutrients. Fruits and vegetables can be used for snacks and desserts. MEATS Limit lean meat, chicken, [place], and fish to no more than 6 ounces per day. Beef, Pork, and Lamb Use lean cuts of beef, pork, and lamb. Lean cuts include:  Extra-lean ground beef.  Arm roast.  Sirloin tip.  Center-cut ham.  Round steak.  Loin chops.  Rump roast.  Tenderloin.  Trim all fat off the outside of meats before cooking. It is not necessary to severely decrease the intake of red meat, but lean choices should be made. Lean meat is rich in protein and contains a highly absorbable form of iron. Premenopausal women, in particular, should avoid reducing lean red meat because this could increase the risk for low red blood cells (iron-deficiency anemia). The organ meats, such as liver, sweetbreads, kidneys, and brain are very rich in cholesterol. They should be limited. Chicken and [place] These are good sources of protein. The fat of poultry can be reduced by removing the skin and underlying fat layers before cooking. Chicken and [place] can be substituted for lean red meat in the diet. Poultry should not be fried or covered with high-fat sauces. Fish and Shellfish Fish is a good source of protein. Shellfish contain  cholesterol, but they usually are low in saturated fatty acids. The preparation of fish is important. Like chicken and [place], they should not be fried or covered with high-fat sauces. EGGS Egg whites contain no fat or cholesterol. They can be eaten often. Try 1 to 2 egg whites instead of whole eggs in recipes or use egg substitutes that do not contain yolk. MILK AND DAIRY PRODUCTS Use skim or 1% milk instead of 2% or whole milk. Decrease whole milk, natural, and processed cheeses. Use nonfat or low-fat (2%) cottage cheese or low-fat cheeses made from vegetable oils. Choose nonfat or low-fat (1 to 2%) yogurt. Experiment with evaporated skim milk in recipes that call for heavy cream. Substitute low-fat yogurt or low-fat cottage cheese for sour cream in dips and salad dressings. Have at least 2 servings of low-fat dairy products, such as 2 glasses of skim (or 1%) milk each day to help get your daily calcium intake.  FATS AND OILS Reduce the total intake of fats, especially saturated fat. Butterfat, lard, and beef fats are high in saturated fat and cholesterol. These should be avoided as much as possible. Vegetable fats do not contain cholesterol, but certain vegetable fats, such as coconut oil, palm oil, and palm kernel oil are very high in saturated fats. These should be limited. These fats are often used in bakery goods, processed foods, popcorn, oils, and nondairy creamers. Vegetable shortenings and some peanut butters contain hydrogenated oils, which are also saturated fats. Read the labels on these foods and check for saturated vegetable oils. Unsaturated vegetable oils and fats do not raise blood cholesterol. However, they should be limited because they are fats and are high in calories. Total fat should still be limited to 30% of your daily caloric intake. Desirable liquid vegetable oils are corn oil, cottonseed oil, olive oil, canola oil, safflower oil, soybean oil, and sunflower oil. Peanut oil is not  as good, but small amounts are acceptable. Buy a heart-healthy tub margarine that has no partially hydrogenated oils in the ingredients. Mayonnaise and salad dressings  often are made from unsaturated fats, but they should also be limited because of their high calorie and fat content. Seeds, nuts, peanut butter, olives, and avocados are high in fat, but the fat is mainly the unsaturated type. These foods should be limited mainly to avoid excess calories and fat. OTHER EATING TIPS Snacks  Most sweets should be limited as snacks. They tend to be rich in calories and fats, and their caloric content outweighs their nutritional value. Some good choices in snacks are graham crackers, melba toast, soda crackers, bagels (no egg), English muffins, fruits, and vegetables. These snacks are preferable to snack crackers, [place] fries, and chips. Popcorn should be air-popped or cooked in small amounts of liquid vegetable oil. Desserts Eat fruit, low-fat yogurt, and fruit ices. AVOID pastries, cake, and cookies. Sherbet, angel food cake, gelatin dessert, frozen low-fat yogurt, or other frozen products that do not contain saturated fat (pure fruit juice bars, frozen ice pops) are also acceptable.  COOKING METHODS Choose those methods that use little or no fat. They include: Poaching.  Braising.  Steaming.  Grilling.  Baking.  Stir-frying.  Broiling.  Microwaving.  Foods can be cooked in a nonstick pan without added fat, or use a nonfat cooking spray in regular cookware. Limit fried foods and avoid frying in saturated fat. Add moisture to lean meats by using water, broth, cooking wines, and other nonfat or low-fat sauces along with the cooking methods mentioned above. Soups and stews should be chilled after cooking. The fat that forms on top after a few hours in the refrigerator should be skimmed off. When preparing meals, avoid using excess salt. Salt can contribute to raising blood pressure in some people. EATING  AWAY FROM HOME Order entres, potatoes, and vegetables without sauces or butter. When meat exceeds the size of a deck of cards (3 to 4 ounces), the rest can be taken home for another meal. Choose vegetable or fruit salads and ask for low-calorie salad dressings to be served on the side. Use dressings sparingly. Limit high-fat toppings, such as bacon, crumbled eggs, cheese, sunflower seeds, and olives. Ask for heart-healthy tub margarine instead of butter.  Hemorrhoids Hemorrhoids are dilated (enlarged) veins around the rectum. Sometimes clots will form in the veins. This makes them swollen and painful. These are called thrombosed hemorrhoids. Causes of hemorrhoids include:  Constipation.   Straining to have a bowel movement.   HEAVY LIFTING HOME CARE INSTRUCTIONS  Eat a well balanced diet and drink 6 to 8 glasses of water every day to avoid constipation. You may also use a bulk laxative.   Avoid straining to have bowel movements.   Keep anal area dry and clean.   Do not use a donut shaped pillow or sit on the toilet for long periods. This increases blood pooling and pain.    Diverticulosis Diverticulosis is a common condition that develops when small pouches (diverticula) form in the wall of the colon. The risk of diverticulosis increases with age. It happens more often in people who eat a low-fiber diet. Most individuals with diverticulosis have no symptoms. Those individuals with symptoms usually experience belly (abdominal) pain, constipation, or loose stools (diarrhea).  HOME CARE INSTRUCTIONS  Increase the amount of fiber in your diet as directed by your caregiver or dietician. This may reduce symptoms of diverticulosis.   Drink at least 6 to 8 glasses of water each day to prevent constipation.   Try not to strain when you have a bowel movement.  Avoiding nuts and seeds to prevent complications is still an uncertain benefit.       FOODS HAVING HIGH FIBER CONTENT  INCLUDE:  Fruits. Apple, peach, pear, tangerine, raisins, prunes.   Vegetables. Brussels sprouts, asparagus, broccoli, cabbage, carrot, cauliflower, romaine lettuce, spinach, summer squash, tomato, winter squash, zucchini.   Starchy Vegetables. Baked beans, kidney beans, lima beans, split peas, lentils, potatoes (with skin).   Grains. Whole wheat bread, brown rice, bran flake cereal, plain oatmeal, white rice, shredded wheat, bran muffins.    SEEK IMMEDIATE MEDICAL CARE IF:  You develop increasing pain or severe bloating.   You have an oral temperature above 101F.   You develop vomiting or bowel movements that are bloody or black.   Move your bowels when your body has the urge; this will require less straining and will decrease pain and pressure.

## 2013-02-01 NOTE — Op Note (Signed)
[facility] [address] [place], [address]   ENDOSCOPY PROCEDURE REPORT  PATIENT: [name]  MR#: [id number] BIRTHDATE: 10/27/1945 , 89  yrs. old GENDER: Male  ENDOSCOPIST: Barney Drain, MD REFERRED [id number] Manuella Ghazi, M.D. PROCEDURE DATE: 02/01/2013 PROCEDURE:   EGD w/ biopsy  INDICATIONS:Barrett's screening. MEDICATIONS: MAC sedation, administered by CRNA TOPICAL ANESTHETIC:   Cetacaine Spray  DESCRIPTION OF PROCEDURE:     Physical exam was performed.  Informed consent was obtained from the patient after explaining the benefits, risks, and alternatives to the procedure.  The patient was connected to the monitor and placed in the left lateral position.  Continuous oxygen was provided by nasal cannula and IV medicine administered through an indwelling cannula.  After administration of sedation, the patients esophagus was intubated and the     endoscope was advanced under direct visualization to the second portion of the duodenum.  The scope was removed slowly by carefully examining the color, texture, anatomy, and integrity of the mucosa on the way out.  The patient was recovered in endoscopy and discharged home in satisfactory condition.   ESOPHAGUS: The mucosa of the esophagus appeared normal.   STOMACH: Moderate erosive gastritis (inflammation) was found in the gastric antrum.   DUODENUM: The duodenal mucosa showed no abnormalities in the bulb and second portion of the duodenum. COMPLICATIONS:   None  ENDOSCOPIC IMPRESSION: 1.   MODERATE Erosive gastritis  RECOMMENDATIONS: CONTINUE OMEPRAZOLE 30 MINUTES PRIOR TO MEALS ONCE DAILY FOREVER. AVOID ITEMS THAT TRIGGER GASTRITIS. FOLLOW A HIGH FIBER/LOW FAT DIET.  AVOID ITEMS THAT CAUSE BLOATING.  DRINK ENOUGH WATER TO KEEP YOUR URINE LIGHT YELLOW. LOSE 10 LBS. BIOPSY RESULTS WILL BE BACK IN 7 DAYS. FOLLOW UP IN 4 MOS.   REPEAT EXAM:   _______________________________ Lorrin MaisBarney Drain, MD  02/01/2013 8:52 AM

## 2013-02-01 NOTE — Transfer of Care (Signed)
Immediate Anesthesia Transfer of Care Note  Patient: [name]  Procedure(s) Performed: Procedure(s) with comments: COLONOSCOPY WITH PROPOFOL (N/A) - in cecum at 0758; withdrawal time = 19 minutes ESOPHAGOGASTRODUODENOSCOPY (EGD) WITH PROPOFOL, GASTRIC BIOPSIES (N/A) POLYPECTOMY (N/A)  Patient Location: PACU  Anesthesia Type:MAC  Level of Consciousness: awake, oriented and patient cooperative  Airway & Oxygen Therapy: Patient Spontanous Breathing  Post-op Assessment: Report given to PACU RN, Post -op Vital signs reviewed and stable and Patient moving all extremities  Post vital signs: Reviewed and stable  Complications: No apparent anesthesia complications

## 2013-02-01 NOTE — H&P (Signed)
  Primary Care Physician:  [name], MD Primary Gastroenterologist:  Dr. Oneida Alar  Pre-Procedure History & Physical: HPI:  [name] is a 67 y.o. male here for SCREENING FOR BARRETT'S ESOPHAGUS/COLON CANCER SCREENING.   Past Medical History  Diagnosis Date  . Diabetes mellitus   . Hypertension   . Chronic back pain   . Anxiety   . GERD (gastroesophageal reflux disease)     Past Surgical History  Procedure Laterality Date  . Back surgery      Prior to Admission medications   Medication Sig Start Date End Date Taking? Authorizing Provider  ALPRAZolam Duanne Moron) 0.5 MG tablet Take 0.5 mg by mouth at bedtime as needed for sleep.  01/06/13  Yes Historical Provider, MD  FLUoxetine (PROZAC) 40 MG capsule Take 40 mg by mouth daily.  01/14/13  Yes Historical Provider, MD  HYDROmorphone (DILAUDID) 4 MG tablet Take 4 mg by mouth every 4 (four) hours as needed for pain.  01/06/13  Yes Historical Provider, MD  metFORMIN (GLUCOPHAGE) 500 MG tablet Take 1,000 mg by mouth 2 (two) times daily with a meal.  12/19/12  Yes Historical Provider, MD  omeprazole (PRILOSEC) 20 MG capsule Take 20 mg by mouth daily.  01/14/13  Yes Historical Provider, MD  polyethylene glycol-electrolytes (TRILYTE) 420 G solution Take 4,000 mLs by mouth as directed. 01/18/13  Yes Danie Binder, MD    Allergies as of 01/18/2013  . (No Known Allergies)    Family History  Problem Relation Age of Onset  . Colon cancer Neg Hx     History   Social History  . Marital Status: Divorced    Spouse Name: N/A    Number of Children: N/A  . Years of Education: N/A   Occupational History  . retired     Architect   Social History Main Topics  . Smoking status: Current Some Day Smoker -- 20 years    Types: Cigars  . Smokeless tobacco: Not on file  . 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 Drug Use: No  . Sexually Active: Yes    Birth Control/ Protection: None   Other  Topics Concern  . Not on file   Social History Narrative  . No narrative on file    Review of Systems: See HPI, otherwise negative ROS   Physical Exam: BP 152/84  Pulse 62  Temp(Src) 98.1 F (36.7 C) (Oral)  Resp 21  Ht 6' (1.829 m)  Wt 201 lb (91.173 kg)  BMI 27.25 kg/m2  SpO2 100% General:   Alert,  pleasant and cooperative in NAD Head:  Normocephalic and atraumatic. Neck:  Supple; Lungs:  Clear throughout to auscultation.    Heart:  Regular rate and rhythm. Abdomen:  Soft, nontender and nondistended. Normal bowel sounds, without guarding, and without rebound.   Neurologic:  Alert and  oriented x4;  grossly normal neurologically.  Impression/Plan:     SCREENING FOR COLON CA AND barrett's  PLAN:  1.EGD/TCS TODAY

## 2013-02-02 ENCOUNTER — Encounter (HOSPITAL_COMMUNITY): Payer: Self-pay | Admitting: Gastroenterology

## 2013-02-10 ENCOUNTER — Telehealth: Payer: Self-pay | Admitting: General Practice

## 2013-02-10 NOTE — Telephone Encounter (Signed)
Patient called and wanted to know about his BX RESULTS.  He had his procedure on last Tuesday and was very anxious about getting results.

## 2013-02-10 NOTE — Telephone Encounter (Signed)
Please call pt. 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 His stomach Bx shows mild gastritis.    CONTINUE OMEPRAZOLE 30 MINUTES PRIOR TO MEALS ONCE DAILY FOREVER.  AVOID ITEMS THAT TRIGGER GASTRITIS.   FOLLOW A HIGH FIBER/LOW FAT DIET. AVOID ITEMS THAT CAUSE BLOATING.   DRINK ENOUGH WATER TO KEEP YOUR URINE LIGHT YELLOW.  LOSE 10 LBS. IT WILL HELP YOUR REFLUX BE BETTER CONTROLLED.   FOLLOW UP IN 4 MOS E15 REFLUX.  TCS IN 5 YEARS.

## 2013-02-10 NOTE — Telephone Encounter (Signed)
Cc PCP 

## 2013-02-10 NOTE — Telephone Encounter (Signed)
Called and informed pt.  

## 2013-02-14 NOTE — Telephone Encounter (Signed)
Reminder in epic °

## 2013-04-14 NOTE — Progress Notes (Signed)
Labs from April 2014:  AST 27, ALT 30, AP 58, Tbili 0.7.

## 2013-04-19 ENCOUNTER — Encounter: Payer: Self-pay | Admitting: Gastroenterology

## 2013-04-25 LAB — COMPREHENSIVE METABOLIC PANEL
ALT: 30 U/L (ref 10–40)
Albumin: 4.4
Alkaline Phosphatase: 58 U/L
CO2: 28 mmol/L
Calcium: 9.1 mg/dL
Chloride: 100 mmol/L
Total Protein: 6.9 g/dL

## 2013-07-22 NOTE — Progress Notes (Signed)
REVIEWED. TCS/EGD JUL 2014 SIMPLE ADENOMAS(3), GASTRITIS

## 2013-09-24 ENCOUNTER — Emergency Department (HOSPITAL_COMMUNITY): Payer: Medicare Other

## 2013-09-24 ENCOUNTER — Emergency Department (HOSPITAL_COMMUNITY)
Admission: EM | Admit: 2013-09-24 | Discharge: 2013-09-24 | Disposition: A | Discharge status: Home / Self Care | Payer: Medicare Other | Attending: Emergency Medicine | Admitting: Emergency Medicine

## 2013-09-24 ENCOUNTER — Encounter (HOSPITAL_COMMUNITY): Payer: Self-pay | Admitting: Emergency Medicine

## 2013-09-24 DIAGNOSIS — Z87891 Personal history of nicotine dependence: Secondary | ICD-10-CM | POA: Insufficient documentation

## 2013-09-24 DIAGNOSIS — F411 Generalized anxiety disorder: Secondary | ICD-10-CM | POA: Insufficient documentation

## 2013-09-24 DIAGNOSIS — G8929 Other chronic pain: Secondary | ICD-10-CM | POA: Insufficient documentation

## 2013-09-24 DIAGNOSIS — I10 Essential (primary) hypertension: Secondary | ICD-10-CM | POA: Insufficient documentation

## 2013-09-24 DIAGNOSIS — M25561 Pain in right knee: Secondary | ICD-10-CM

## 2013-09-24 DIAGNOSIS — Z87448 Personal history of other diseases of urinary system: Secondary | ICD-10-CM | POA: Insufficient documentation

## 2013-09-24 DIAGNOSIS — D696 Thrombocytopenia, unspecified: Secondary | ICD-10-CM | POA: Insufficient documentation

## 2013-09-24 DIAGNOSIS — E119 Type 2 diabetes mellitus without complications: Secondary | ICD-10-CM | POA: Insufficient documentation

## 2013-09-24 DIAGNOSIS — K219 Gastro-esophageal reflux disease without esophagitis: Secondary | ICD-10-CM | POA: Insufficient documentation

## 2013-09-24 DIAGNOSIS — Z79899 Other long term (current) drug therapy: Secondary | ICD-10-CM | POA: Insufficient documentation

## 2013-09-24 DIAGNOSIS — M25569 Pain in unspecified knee: Secondary | ICD-10-CM | POA: Insufficient documentation

## 2013-09-24 HISTORY — DX: Disorder of kidney and ureter, unspecified: N28.9

## 2013-09-24 HISTORY — DX: Gastritis, unspecified, without bleeding: K29.70

## 2013-09-24 LAB — BASIC METABOLIC PANEL
BUN: 29 mg/dL — ABNORMAL HIGH (ref 6–23)
CO2: 27 mEq/L (ref 19–32)
Calcium: 9.3 mg/dL (ref 8.4–10.5)
Chloride: 98 mEq/L (ref 96–112)
Creatinine, Ser: 1.43 mg/dL — ABNORMAL HIGH (ref 0.50–1.35)
GFR, EST AFRICAN AMERICAN: 57 mL/min — AB (ref >90)
GFR, EST NON AFRICAN AMERICAN: 49 mL/min — AB (ref >90)
GLUCOSE: 249 mg/dL — AB (ref 70–99)
POTASSIUM: 5.1 meq/L (ref 3.7–5.3)
SODIUM: 136 meq/L — AB (ref 137–147)

## 2013-09-24 LAB — CBC WITH DIFFERENTIAL/PLATELET
BAND NEUTROPHILS: 0 % (ref 0–10)
BASOS ABS: 0 10*3/uL (ref 0.0–0.1)
BLASTS: 0 %
Basophils Relative: 0 % (ref 0–1)
EOS ABS: 0 10*3/uL (ref 0.0–0.7)
Eosinophils Relative: 0 % (ref 0–5)
HEMATOCRIT: 32.9 % — AB (ref 39.0–52.0)
Hemoglobin: 11.7 g/dL — ABNORMAL LOW (ref 13.0–17.0)
Lymphocytes Relative: 25 % (ref 12–46)
Lymphs Abs: 1.2 10*3/uL (ref 0.7–4.0)
MCH: 32 pg (ref 26.0–34.0)
MCHC: 35.6 g/dL (ref 30.0–36.0)
MCV: 89.9 fL (ref 78.0–100.0)
METAMYELOCYTES PCT: 0 %
MONO ABS: 0.9 10*3/uL (ref 0.1–1.0)
MONOS PCT: 18 % — AB (ref 3–12)
Myelocytes: 0 %
Neutro Abs: 2.7 10*3/uL (ref 1.7–7.7)
Neutrophils Relative %: 57 % (ref 43–77)
Platelets: 41 10*3/uL — ABNORMAL LOW (ref 150–400)
Promyelocytes Absolute: 0 %
RBC: 3.66 MIL/uL — ABNORMAL LOW (ref 4.22–5.81)
RDW: 21 % — AB (ref 11.5–15.5)
WBC: 4.8 10*3/uL (ref 4.0–10.5)
nRBC: 0 /100 WBC

## 2013-09-24 LAB — CBG MONITORING, ED: Glucose-Capillary: 230 mg/dL — ABNORMAL HIGH (ref 70–99)

## 2013-09-24 MED ORDER — HYDROMORPHONE HCL PF 1 MG/ML IJ SOLN
1.0000 mg | Freq: Once | INTRAMUSCULAR | Status: AC
  Administered 2013-09-24: 1 mg via INTRAMUSCULAR
  Filled 2013-09-24: qty 1

## 2013-09-24 MED ORDER — OXYCODONE-ACETAMINOPHEN 5-325 MG PO TABS
2.0000 | ORAL_TABLET | Freq: Once | ORAL | Status: AC
  Administered 2013-09-24: 2 via ORAL

## 2013-09-24 MED ORDER — OXYCODONE-ACETAMINOPHEN 5-325 MG PO TABS
ORAL_TABLET | ORAL | Status: AC
  Filled 2013-09-24: qty 2

## 2013-09-24 MED ORDER — OXYCODONE-ACETAMINOPHEN 5-325 MG PO TABS: ORAL_TABLET | ORAL | Status: DC

## 2013-09-24 NOTE — ED Notes (Signed)
Patient c/o right knee pain that started yesterday. Per patient swelling and redness to right knee and lower leg. Patient reports hot and tender to touch. Patient denies any known injuries. Per patient seen at [facility] yesterday and given injection for pain. Patient also given Naproxen and prednisone.

## 2013-09-24 NOTE — Discharge Instructions (Signed)
°Emergency Department Resource Guide °1) Find a Doctor and Pay Out of Pocket °Although you won't have to find out who is covered by your insurance plan, it is a good idea to ask around and get recommendations. You will then need to call the office and see if the doctor you have chosen will accept you as a new patient and what types of options they offer for patients who are self-pay. Some doctors offer discounts or will set up payment plans for their patients who do not have insurance, but you will need to ask so you aren't surprised when you get to your appointment. ° °2) Contact Your Local Health Department °Not all health departments have doctors that can see patients for sick visits, but many do, so it is worth a call to see if yours does. If you don't know where your local health department is, you can check in your phone book. The CDC also has a tool to help you locate your state's health department, and many state websites also have listings of all of their local health departments. ° °3) Find a Walk-in Clinic °If your illness is not likely to be very severe or complicated, you may want to try a walk in clinic. These are popping up all over the country in pharmacies, drugstores, and shopping centers. They're usually staffed by nurse practitioners or physician assistants that have been trained to treat common illnesses and complaints. They're usually fairly quick and inexpensive. However, if you have serious medical issues or chronic medical problems, these are probably not your best option. ° °No Primary Care Doctor: °- Call Health Connect at  832-8000 - they can help you locate a primary care doctor that  accepts your insurance, provides certain services, etc. °- Physician Referral Service- 1-800-533-3463 ° °Chronic Pain Problems: °Organization         Address  Phone   Notes  °[facility]  (336) 297-2271 Patients need to be referred by their primary care doctor.  ° °Medication  Assistance: °Organization         Address  Phone   Notes  °[place] [facility] [address] °[place], [place] [address] (336) 641-8030 --Must be a resident of [place] °-- Must have NO insurance coverage whatsoever (no Medicaid/ Medicare, etc.) °-- The pt. MUST have a primary care doctor that directs their care regularly and follows them in the community °  °[facility]  (866) 331-1348   °[employer]  (888) 892-1162   ° °Agencies that provide inexpensive medical care: °Organization         Address  Phone   Notes  °[facility]  (336) 832-8035   °[facility]    (336) 832-7272   °[facility] [address] °[place], [place] [address] (336) 832-4777   °[facility] [address], °[place] (336) 271-4999   °[facility]    (336) 373-0678   °[facility]    (336) 272-1050   °[facility] ° [address], [place] Phone:  (336) 832-4444, Fax:  (336) 832-4440 Hours of Operation:  9 am - 6 pm, M-F.  Also accepts Medicaid/Medicare and self-pay.  °[facility] ° [address], [place] Phone: (336) [phone], Fax: (336) [phone]. Hours of Operation:  8:30 am - 5:30 pm, M-F.  Also accepts Medicaid and self-pay.  °[facility] [place] [address], [place] Phone: (336) 878-6027   °[facility] [address], [place], [place] (336)723-1848, Ext. 123 Mondays & Thursdays: 7-9 AM.  First 15 patients are seen on a first come, first serve basis. °  ° °Medicaid-accepting [place] Providers: ° °Organization         Address  Phone   Notes  °[facility] [address], [place] (336) 641-2100 Also accepts self-pay patients.  °[facility] [address], [place] ° (336) 856-9996   °[facility] [address], [place]  (336) 288-8857   °[facility] [address] [place] [address], [place] (336) 299-7000   °Veita Bland [address], [place]  ° (336) 373-1557 Only accepts [place] Access Medicaid patients after they have their name applied to their card.  ° °Self-Pay (no insurance) in [place]: ° °Organization         Address  Phone   Notes  °Sickle Cell Patients, [facility] [address], [place] (336) 832-1970   °[facility] [address], [place] (336) 832-4400   °[facility]  ° [address],  (336) 992-4800   °[facility]/Dr. Osei-Bonsu ° [address] [place] [address], [place] or [address], [place] (336) [phone] Phone number for both [place] and [place] locations is the same.  °[facility] [address], [place] (336) 299-0000   °[facility] [address] [place] [address], [place] or [address] (336) 852-7530 °(336) 878-2260   °[facility] [address], [place] (336) 350-1642, phone; (336) 294-5005, fax Sees patients 1st and 3rd Saturday of every month.  Must not qualify for public or private insurance (i.e. Medicaid, Medicare, [place] Health Choice, Veterans' Benefits) • Household income should be no more than 200% of the poverty level •The clinic cannot treat you if you are pregnant or think you are pregnant • Sexually transmitted diseases are not treated at the clinic.  ° ° °Dental Care: °Organization         Address  Phone  Notes  °[place] [facility] [address], [place] (336) 641-6152 Accepts children up to age 21 who are enrolled in Medicaid or [place] Health Choice; pregnant women with a Medicaid card; and children who have applied for Medicaid or [place] Health Choice, but were declined, whose parents can pay a reduced fee at time of service.  °[place]  [facility] [place]  [address], [place] (336) 641-7733 Accepts children up to age 21 who are enrolled in Medicaid or [place] Health Choice; pregnant women with a Medicaid card; and children who have applied for Medicaid or [place] Health Choice, but were declined, whose parents can pay a reduced fee at time of service.  °[facility] ° [address],  (336) 641-4533 Patients are seen by appointment only. Walk-ins are not accepted. [facility] will see patients 18 years of age and older. °Monday - Tuesday (8am-5pm) °Most Wednesdays (8:30-5pm) °$30 per visit, cash only  °[facility] ° [address], [place] (336) 641-4533 Patients are seen by appointment only. Walk-ins are not accepted. [facility] will see patients 18 years of age and older. °One   Wednesday Evening (Monthly: Volunteer Based).  $30 per visit, cash only  °[facility]  (919) 537-3737 for adults; Children under age 4, call Graduate Pediatric Dentistry at ([phone]) [phone]. Children aged 4-14, please call (919) [phone] to request a pediatric application. ° Dental services are provided in all areas of dental care including fillings, crowns and bridges, complete and partial dentures, implants, gum treatment, root canals, and extractions. Preventive care is also provided. Treatment is provided to both adults and children. °Patients are selected via a lottery and there is often a waiting list. °  °[facility] [address], °[place] ° (336) 763-8833 [email or web address].[email or web address] °  °[facility] [address], [place], [place] (336)723-1848, Ext. 123 Second and Fourth Thursday of each month, opens at 6:30 AM; Clinic ends at 9 AM.  Patients are seen on a first-come first-served basis, and a limited number are seen during each clinic.  ° °[facility] ° [address], [place], [place] (336) [phone]    Eligibility Requirements °You must have lived in [place], [place], or [place] counties for at least the last three months. °  You cannot be eligible for state or federal sponsored healthcare insurance, including Veterans Administration, Medicaid, or Medicare. °  You generally cannot be eligible for healthcare insurance through your employer.  °  How to apply: °Eligibility screenings are held every Tuesday and Wednesday afternoon from 1:00 pm until 4:00 pm. You do not need an appointment for the interview!  °[facility] [address], [place], [place] 336-631-2330   °[facility]  336-342-8273   °[facility]  336-703-3100   °[facility]  336-570-6415   ° °Behavioral Health Resources in the Community: °Intensive Outpatient Programs °Organization         Address  Phone  Notes  °[facility] [address], [place], [place] 336-878-6098   °[facility] [address], [place], [place] 336-832-9800   °[facility] [address], [place], [place] ° 336-882-2125   °[facility] [address],  °[place], [place] 1-800-853-5163 or 336-641-4981   °Substance Abuse Resources °Organization         Address  Phone  Notes  °[facility]  336-882-2125   °[facility]  336-784-9470   °[facility]  336-285-9073   °[facility]  336-845-3988   °Residential & Outpatient Substance Abuse Program  1-800-659-3381   °Psychological Services °Organization         Address  Phone  Notes  °[facility]  336- 832-9600   °[facility]  336- 378-7881   °[facility] [address], [place] 1-800-853-5163 or 336-641-4981   ° °Mobile Crisis Teams °Organization         Address  Phone  Notes  °[facility], Mobile Crisis Care Unit  1-877-626-1772   °Assertive °[facility] ° [address]  [place], [place] 336-834-9664   °Sharon DeEsch [address] °[place]  336-554-5454   ° °Self-Help/Support Groups °Organization         Address  Phone             Notes  °[facility] [place] - variety of support groups  336- 373-1402 Call for more information  °Narcotics Anonymous (NA), [facility] [address], °[place]   2 meetings at this location  ° °  Residential Treatment Programs Organization         Address  Phone  Notes  [facility] [address],    [place]  1-3391147156   [facility]  [address] [place] [phone], [place], [place]   [place], [place] [phone] Admissions: 8am-3pm M-F  [facility] [place] [address],    [place], [place] [phone]   [facility] [address] [place], [place], [place]   [facility] [address]  [place], [place]   [facility] Intensive Outpatient [address], [place], [place]   [facility] ([place] [address]  [place], [place] 1-213-790-1375 or 2142995672   [facility] [address] [place], [place] Accepts Medicaid  [facility] [address]  [place] 1-(281) 231-1311 Substance Abuse/Addiction Treatment   [facility]         Address  Phone  Notes  [facility]  220-846-1207   Domenic Schwab, PhD [address] [place], [place]   438-653-6518 or (518)753-8108   [facility] [place], [place] (617)617-2294   [facility] [address], [place], [place] [phone] Insurance/Medicaid/sponsorship through [facility] [address]                                    [place], [place] [phone] [place] [address], [place] [phone]    Dr. Adele Schilder  [phone]   [facility] Dept. 1) [address], [place] 2) [place] 3)  [place] [address], [place] [phone]  [phone]   [place] (978) [phone] or [phone] (After Hours)       Return to the hospital tomorrow as scheduled for an outpatient ultrasound of your right leg. Take the new pain medication prescription as directed. Stop taking the naprosyn. Continue to walk with your walker for comfort. Apply moist heat or ice to the area(s) of discomfort, for 15 minutes at a time, several times per day for the next few days.  Do not fall asleep on a heating or ice pack. Your platelet count was low today. Call your regular medical doctor on Monday to schedule a follow up appointment in the next 2 to 3 days to re-check your platelet count. Call the Orthopedic doctor on Monday to schedule a follow up appointment this week. Return to the Emergency Department immediately if worsening.

## 2013-09-24 NOTE — ED Provider Notes (Signed)
CSN: [id number]     Arrival date & time 09/24/13  1825 History   First MD Initiated Contact with Patient 09/24/13 1935     Chief Complaint  Patient presents with  . Knee Pain      HPI Pt was seen at [facility]. Per pt and his family, c/o gradual onset and persistence of constant right knee "pain" that began yesterday. Pt states he "woke up" with symptoms. Pt states yesterday he had "redness" extending from his right mid-medial thigh to his knee and lower leg. Pt was evaluated at [facility] yesterday for same, dx "joint pain," rx naprosyn and prednisone. Pt states the redness to his right medial thigh area has improved today, but his pain continues. Pt also notes he has run out of his chronic narcotic pain medication (dilaudid) 2 days ago.  Denies fevers, no injury, no knee swelling, no foot pain, no focal motor weakness, no tingling/numbness in extremities.    Past Medical History  Diagnosis Date  . Diabetes mellitus   . Hypertension   . Chronic back pain   . Anxiety   . GERD (gastroesophageal reflux disease)   . Gastritis   . Renal insufficiency    Past Surgical History  Procedure Laterality Date  . Back surgery    . Colonoscopy with propofol N/A 02/01/2013    Procedure: COLONOSCOPY WITH PROPOFOL;  Surgeon: Danie Binder, MD;  Location: [facility];  Service: Endoscopy;  Laterality: N/A;  in cecum at 0758; withdrawal time = 19 minutes  . Esophagogastroduodenoscopy (egd) with propofol N/A 02/01/2013    Procedure: ESOPHAGOGASTRODUODENOSCOPY (EGD) WITH PROPOFOL, GASTRIC BIOPSIES;  Surgeon: Danie Binder, MD;  Location: [facility];  Service: Endoscopy;  Laterality: N/A;  . Polypectomy N/A 02/01/2013    Procedure: POLYPECTOMY;  Surgeon: Danie Binder, MD;  Location: [facility];  Service: Endoscopy;  Laterality: N/A;   Family History  Problem Relation Age of Onset  . Colon cancer Neg Hx    History  Substance Use Topics  . Smoking status: Former Smoker -- 1.00 packs/day for 20 years    Types:  Cigars    Quit date: 08/04/2013  . Smokeless tobacco: Never Used  . 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 ; ; Eyes: Negative for eye pain, redness and discharge. ; ; ENMT: Negative for ear pain, hoarseness, nasal congestion, sinus pressure and sore throat. ; ; Cardiovascular: Negative for chest pain, palpitations, diaphoresis, dyspnea and peripheral edema. ; ; Respiratory: Negative for cough, wheezing and stridor. ; ; Gastrointestinal: Negative for nausea, vomiting, diarrhea, abdominal pain, blood in stool, hematemesis, jaundice and rectal bleeding. . ; ; Genitourinary: Negative for dysuria, flank pain and hematuria. ; ; Musculoskeletal: +right knee pain. Negative for back pain and neck pain. Negative for deformity and trauma.; ; Skin: +rash. Negative for pruritus, abrasions, blisters, and skin lesion.; ; Neuro: Negative for headache, lightheadedness and neck stiffness. Negative for weakness, altered level of consciousness , altered mental status, extremity weakness, paresthesias, involuntary movement, seizure and syncope.     Allergies  Review of patient's allergies indicates no known allergies.  Home Medications   Current Outpatient Rx  Name  Route  Sig  Dispense  Refill  . ALPRAZolam (XANAX) 0.5 MG tablet   Oral   Take 0.5  mg by mouth at bedtime as needed for sleep.          Marland Kitchen FLUoxetine (PROZAC) 40 MG capsule   Oral   Take 40 mg by mouth daily.          Marland Kitchen HYDROmorphone (DILAUDID) 4 MG tablet   Oral   Take 4 mg by mouth every 4 (four) hours as needed for pain.          . metFORMIN (GLUCOPHAGE) 500 MG tablet   Oral   Take 1,000 mg by mouth 2 (two) times daily with a meal.          . omeprazole (PRILOSEC) 20 MG capsule   Oral   Take 20 mg by mouth daily.           BP 138/82   Pulse 98  Temp(Src) 99 F (37.2 C) (Oral)  Resp 18  Ht 6' (1.829 m)  Wt 214 lb (97.07 kg)  BMI 29.02 kg/m2  SpO2 96% Physical Exam 2000: Physical examination:  Nursing notes reviewed; Vital signs and O2 SAT reviewed;  Constitutional: Well developed, Well nourished, Well hydrated, In no acute distress; Head:  Normocephalic, atraumatic; Eyes: EOMI, PERRL, No scleral icterus; ENMT: Mouth and pharynx normal, Mucous membranes moist; Neck: Supple, Full range of motion, No lymphadenopathy; Cardiovascular: Regular rate and rhythm, No murmur, rub, or gallop; Respiratory: Breath sounds clear & equal bilaterally, No rales, rhonchi, wheezes.  Speaking full sentences with ease, Normal respiratory effort/excursion; Chest: Nontender, Movement normal; Abdomen: Soft, Nontender, Nondistended, Normal bowel sounds; Genitourinary: No CVA tenderness; Extremities: Pulses normal, No deformity. Right knee with mild decreased ROM due to pain. Pt is able to lift extended RLE off stretcher, and extend right lower leg against resistance.  No ligamentous laxity.  No patellar or quad tendon step-offs.  NMS intact right foot, strong pedal pp. +plantarflexion of right foot w/calf squeeze.  No palpable gap right Achilles's tendon.  No proximal fibular head tenderness.  No warmth, erythema, open wounds, ecchymosis or deformity. No obvious joint effusion. +TTP right patellar and right medial knee areas with mild localized edema to right medial knee. Petechiae extend from medial right knee to proximal medial lower leg..; Neuro: AA&Ox3, Major CN grossly intact.  Speech clear. No gross focal motor or sensory deficits in extremities.; Skin: Color normal, Warm, Dry. Petechiae to right medial knee and proximal medial lower leg, otherwise no other areas of petechiae.    ED Course  Procedures    EKG Interpretation None      MDM  MDM Reviewed: previous chart, nursing note and vitals Reviewed previous: labs Interpretation: labs and  x-ray    Results for orders placed during the hospital encounter of 09/24/13  CBC WITH DIFFERENTIAL      Result Value Ref Range   WBC 4.8  4.0 - 10.5 K/uL   RBC 3.66 (*) 4.22 - 5.81 MIL/uL   Hemoglobin 11.7 (*) 13.0 - 17.0 g/dL   HCT 32.9 (*) 39.0 - 52.0 %   MCV 89.9  78.0 - 100.0 fL   MCH 32.0  26.0 - 34.0 pg   MCHC 35.6  30.0 - 36.0 g/dL   RDW 21.0 (*) 11.5 - 15.5 %   Platelets 41 (*) 150 - 400 K/uL   Neutrophils Relative % 57  43 - 77 %   Lymphocytes Relative 25  12 - 46 %   Monocytes Relative 18 (*) 3 - 12 %   Eosinophils Relative 0  0 - 5 %  Basophils Relative 0  0 - 1 %   Band Neutrophils 0  0 - 10 %   Metamyelocytes Relative 0     Myelocytes 0     Promyelocytes Absolute 0     Blasts 0     nRBC 0  0 /100 WBC   Neutro Abs 2.7  1.7 - 7.7 K/uL   Lymphs Abs 1.2  0.7 - 4.0 K/uL   Monocytes Absolute 0.9  0.1 - 1.0 K/uL   Eosinophils Absolute 0.0  0.0 - 0.7 K/uL   Basophils Absolute 0.0  0.0 - 0.1 K/uL  BASIC METABOLIC PANEL      Result Value Ref Range   Sodium 136 (*) 137 - 147 mEq/L   Potassium 5.1  3.7 - 5.3 mEq/L   Chloride 98  96 - 112 mEq/L   CO2 27  19 - 32 mEq/L   Glucose, Bld 249 (*) 70 - 99 mg/dL   BUN 29 (*) 6 - 23 mg/dL   Creatinine, Ser 1.43 (*) 0.50 - 1.35 mg/dL   Calcium 9.3  8.4 - 10.5 mg/dL   GFR calc non Af Amer 49 (*) >90 mL/min   GFR calc Af Amer 57 (*) >90 mL/min  CBG MONITORING, ED      Result Value Ref Range   Glucose-Capillary 230 (*) 70 - 99 mg/dL   Dg Knee Complete 4 Views Right 09/24/2013   CLINICAL DATA:  Right knee pain and swelling and edema without history of injury  EXAM: RIGHT KNEE - COMPLETE 4+ VIEW  COMPARISON:  DG KNEE COMPLETE 4+V*R* dated 09/23/2013  FINDINGS: Four views of the right knee reveal the bones to be adequately mineralized. There is no lytic nor blastic lesion or periosteal reaction. Minimal beaking of the tibial spines is present. No large joint effusion is demonstrated. There are arterial calcifications present. A fabella  is present. There may be a small effusion in the suprapatellar region.  IMPRESSION: There is no acute bony abnormality of the right knee. 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 A small joint effusion may be present.   Electronically Signed   By: David  Martinique   On: 09/24/2013 20:35    2100:  Pt with known renal insuff. New low platelet count today. Pt does not have diffuse petechiae, only area is right medial knee and lower leg area. New medication since yesterday is naprosyn; pt will d/c this and f/u PMD this week for re-check platelet count. Will have pt f/u tomorrow morning for [place] LLE r/o DVT and Bakers' cyst. Pt already has a walker at home, refuses crutches. Pt also refuses knee immobilizer. VS remain stable, afebrile. Feels better after pain meds and wants to go home now. Requesting another dose of pain med before discharge. Long d/w pt and family regarding dx and testing.  Multiple questions answered.  Verb understanding, agreeable to d/c home with outpt f/u.      Alfonzo Feller, DO 09/26/13 1527

## 2013-09-25 ENCOUNTER — Emergency Department (HOSPITAL_COMMUNITY)
Admission: EM | Admit: 2013-09-25 | Discharge: 2013-09-25 | Disposition: A | Discharge status: Home / Self Care | Payer: Medicare Other | Attending: Emergency Medicine | Admitting: Emergency Medicine

## 2013-09-25 ENCOUNTER — Encounter (HOSPITAL_COMMUNITY): Payer: Self-pay | Admitting: Emergency Medicine

## 2013-09-25 ENCOUNTER — Ambulatory Visit (HOSPITAL_COMMUNITY)
Admit: 2013-09-25 | Discharge: 2013-09-25 | Disposition: A | Discharge status: Home / Self Care | Payer: Medicare Other | Attending: Emergency Medicine | Admitting: Emergency Medicine

## 2013-09-25 DIAGNOSIS — Z79899 Other long term (current) drug therapy: Secondary | ICD-10-CM | POA: Insufficient documentation

## 2013-09-25 DIAGNOSIS — E119 Type 2 diabetes mellitus without complications: Secondary | ICD-10-CM | POA: Insufficient documentation

## 2013-09-25 DIAGNOSIS — K219 Gastro-esophageal reflux disease without esophagitis: Secondary | ICD-10-CM | POA: Insufficient documentation

## 2013-09-25 DIAGNOSIS — IMO0002 Reserved for concepts with insufficient information to code with codable children: Secondary | ICD-10-CM | POA: Insufficient documentation

## 2013-09-25 DIAGNOSIS — Z87448 Personal history of other diseases of urinary system: Secondary | ICD-10-CM | POA: Insufficient documentation

## 2013-09-25 DIAGNOSIS — Z87891 Personal history of nicotine dependence: Secondary | ICD-10-CM | POA: Insufficient documentation

## 2013-09-25 DIAGNOSIS — I10 Essential (primary) hypertension: Secondary | ICD-10-CM | POA: Insufficient documentation

## 2013-09-25 DIAGNOSIS — R21 Rash and other nonspecific skin eruption: Secondary | ICD-10-CM | POA: Insufficient documentation

## 2013-09-25 DIAGNOSIS — F411 Generalized anxiety disorder: Secondary | ICD-10-CM | POA: Insufficient documentation

## 2013-09-25 DIAGNOSIS — M25569 Pain in unspecified knee: Secondary | ICD-10-CM | POA: Insufficient documentation

## 2013-09-25 DIAGNOSIS — G8929 Other chronic pain: Secondary | ICD-10-CM | POA: Insufficient documentation

## 2013-09-25 DIAGNOSIS — D696 Thrombocytopenia, unspecified: Secondary | ICD-10-CM | POA: Insufficient documentation

## 2013-09-25 LAB — TYPE AND SCREEN
ABO/RH(D): O POS
Antibody Screen: NEGATIVE

## 2013-09-25 LAB — BASIC METABOLIC PANEL
BUN: 37 mg/dL — ABNORMAL HIGH (ref 6–23)
CHLORIDE: 95 meq/L — AB (ref 96–112)
CO2: 27 mEq/L (ref 19–32)
Calcium: 9.5 mg/dL (ref 8.4–10.5)
Creatinine, Ser: 1.58 mg/dL — ABNORMAL HIGH (ref 0.50–1.35)
GFR, EST AFRICAN AMERICAN: 50 mL/min — AB (ref >90)
GFR, EST NON AFRICAN AMERICAN: 43 mL/min — AB (ref >90)
Glucose, Bld: 254 mg/dL — ABNORMAL HIGH (ref 70–99)
POTASSIUM: 5.2 meq/L (ref 3.7–5.3)
Sodium: 135 mEq/L — ABNORMAL LOW (ref 137–147)

## 2013-09-25 LAB — CBC WITH DIFFERENTIAL/PLATELET
BASOS ABS: 0.2 10*3/uL — AB (ref 0.0–0.1)
Basophils Relative: 4 % — ABNORMAL HIGH (ref 0–1)
EOS PCT: 1 % (ref 0–5)
Eosinophils Absolute: 0.1 10*3/uL (ref 0.0–0.7)
HCT: 35.5 % — ABNORMAL LOW (ref 39.0–52.0)
Hemoglobin: 12.3 g/dL — ABNORMAL LOW (ref 13.0–17.0)
LYMPHS PCT: 20 % (ref 12–46)
Lymphs Abs: 1.2 10*3/uL (ref 0.7–4.0)
MCH: 31.5 pg (ref 26.0–34.0)
MCHC: 34.6 g/dL (ref 30.0–36.0)
MCV: 91 fL (ref 78.0–100.0)
MONOS PCT: 13 % — AB (ref 3–12)
Monocytes Absolute: 0.8 10*3/uL (ref 0.1–1.0)
NEUTROS PCT: 62 % (ref 43–77)
Neutro Abs: 3.5 10*3/uL (ref 1.7–7.7)
PLATELETS: 45 10*3/uL — AB (ref 150–400)
RBC: 3.9 MIL/uL — AB (ref 4.22–5.81)
RDW: 21.4 % — ABNORMAL HIGH (ref 11.5–15.5)
SMEAR REVIEW: DECREASED
WBC: 5.8 10*3/uL (ref 4.0–10.5)

## 2013-09-25 LAB — SEDIMENTATION RATE: SED RATE: 59 mm/h — AB (ref 0–16)

## 2013-09-25 LAB — URIC ACID: URIC ACID, SERUM: 6.3 mg/dL (ref 4.0–7.8)

## 2013-09-25 MED ORDER — METHYLPREDNISOLONE (PAK) 4 MG PO TABS: ORAL_TABLET | ORAL | Status: DC

## 2013-09-25 MED ORDER — HYDROMORPHONE HCL PF 1 MG/ML IJ SOLN
INTRAMUSCULAR | Status: AC
  Filled 2013-09-25: qty 1

## 2013-09-25 MED ORDER — HYDROMORPHONE HCL PF 1 MG/ML IJ SOLN
1.0000 mg | Freq: Once | INTRAMUSCULAR | Status: AC
  Administered 2013-09-25: 1 mg via INTRAVENOUS
  Filled 2013-09-25: qty 1

## 2013-09-25 MED ORDER — HYDROMORPHONE HCL PF 1 MG/ML IJ SOLN
1.0000 mg | Freq: Once | INTRAMUSCULAR | Status: AC
  Administered 2013-09-25: 1 mg via INTRAVENOUS

## 2013-09-25 MED ORDER — LIDOCAINE HCL (PF) 2 % IJ SOLN
INTRAMUSCULAR | Status: AC
  Administered 2013-09-25: 15:00:00
  Filled 2013-09-25: qty 10

## 2013-09-25 NOTE — ED Notes (Signed)
Dr Dina Rich unable to obtain any fluid from right knee, pt tolerated procedure well,

## 2013-09-25 NOTE — ED Notes (Signed)
Pt asking for additional pain medication, states that 1mg  dilaudid will not work for him, Dr Dina Rich notified,

## 2013-09-25 NOTE — ED Notes (Signed)
Pt appears drowsy but states that the pain "is about the same"

## 2013-09-25 NOTE — ED Notes (Signed)
Dr Dina Rich at bedside to attempt to draw fluid off right knee,

## 2013-09-25 NOTE — ED Notes (Signed)
Pt c/o pain, redness, and swelling to r knee and lower leg since Friday.  Denies injury.  Pt had [place] this am to r/o DVT.  Reports [place] was negative for DVT but pt says needs something for pain.  Reports has not had prescription from last night filled because his pharmacy isn't open yet.  Pt says he doesn't think the percocet will help because pt takes dilaudid for back pain.  Pt says has been out of dilaudid for 3 or 4 days.

## 2013-09-25 NOTE — ED Notes (Signed)
Pt asking for something to drink, advised that he would not be allowed to have anything to eat or drink, pt and family expressed understanding,

## 2013-09-25 NOTE — ED Provider Notes (Addendum)
CSN: [id number]     Arrival date & time 09/25/13  1020 History   First MD Initiated Contact with Patient 09/25/13 1025     Chief Complaint  Patient presents with  . Leg Pain     (Consider location/radiation/quality/duration/timing/severity/associated sxs/prior Treatment) HPI  Patient presents with right knee pain. He was seen and evaluated yesterday for the same. He returned this morning for an ultrasound to rule out DVT. Ultrasound is negative. I shared results with the patient and his family. They express continued concern and worsening pain. They "want answers." I encouraged them if they still have concerns and the patient was unable to walk, patient seek reevaluation. Confirm history from yesterday. Patient woke up on Friday morning with right knee pain. He denies any injury. Patient was evaluated in an outside hospital and given naproxen for his pain. Patient was seen a second time yesterday. At that time he was noted to be thrombocytopenic with reported erythema over the right medial knee with petechiae. Patient denies any fevers. Patient was prescribed Percocet but states he was unable to get this filled. He is also out of his Dilaudid which he takes daily for back pain.  Rates pain 10/10.  He has not taken anything else for the pain. No history of gout.   Past Medical History  Diagnosis Date  . Diabetes mellitus   . Hypertension   . Chronic back pain   . Anxiety   . GERD (gastroesophageal reflux disease)   . Gastritis   . Renal insufficiency    Past Surgical History  Procedure Laterality Date  . Back surgery    . Colonoscopy with propofol N/A 02/01/2013    Procedure: COLONOSCOPY WITH PROPOFOL;  Surgeon: Danie Binder, MD;  Location: [facility];  Service: Endoscopy;  Laterality: N/A;  in cecum at 0758; withdrawal time = 19 minutes  . Esophagogastroduodenoscopy (egd) with propofol N/A 02/01/2013    Procedure: ESOPHAGOGASTRODUODENOSCOPY (EGD) WITH PROPOFOL, GASTRIC BIOPSIES;  Surgeon:  Danie Binder, MD;  Location: [facility];  Service: Endoscopy;  Laterality: N/A;  . Polypectomy N/A 02/01/2013    Procedure: POLYPECTOMY;  Surgeon: Danie Binder, MD;  Location: [facility];  Service: Endoscopy;  Laterality: N/A;   Family History  Problem Relation Age of Onset  . Colon cancer Neg Hx    History  Substance Use Topics  . Smoking status: Former Smoker -- 1.00 packs/day for 20 years    Types: Cigars    Quit date: 08/04/2013  . Smokeless tobacco: Never Used  . 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  Negative for fever.  Respiratory: Negative.  Negative for chest tightness and shortness of breath.   Cardiovascular: Negative.  Negative for chest pain.  Gastrointestinal: Negative.  Negative for abdominal pain.  Genitourinary: Negative.  Negative for dysuria.  Musculoskeletal: Negative for back pain.       Right knee pain and swelling  Skin: Positive for color change and rash.  Neurological: Negative for headaches.  All other systems reviewed and are negative.      Allergies  Review of patient's allergies indicates no known allergies.  Home Medications   Current Outpatient Rx  Name  Route  Sig  Dispense  Refill  . DULoxetine (CYMBALTA) 60 MG capsule   Oral   Take 1 capsule by mouth 2 (two) times daily.         Marland Kitchen  HYDROmorphone (DILAUDID) 4 MG tablet   Oral   Take 8 mg by mouth 2 (two) times daily.          Marland Kitchen lisinopril (PRINIVIL,ZESTRIL) 20 MG tablet   Oral   Take 1 tablet by mouth daily.         Marland Kitchen LORazepam (ATIVAN) 1 MG tablet   Oral   Take 1 mg by mouth 5 (five) times daily.         . metFORMIN (GLUCOPHAGE) 500 MG tablet   Oral   Take 1,000 mg by mouth 2 (two) times daily with a meal.          . omeprazole (PRILOSEC) 20 MG capsule   Oral   Take 20 mg by mouth daily.          Marland Kitchen tiZANidine (ZANAFLEX) 4 MG tablet   Oral   Take 1 tablet by mouth  2 (two) times daily as needed for muscle spasms.          . methylPREDNIsolone (MEDROL DOSPACK) 4 MG tablet      follow package directions   21 tablet   0   . oxyCODONE-acetaminophen (PERCOCET/ROXICET) 5-325 MG per tablet      1 or 2 tabs PO q6h prn pain   25 tablet   0   . predniSONE (DELTASONE) 50 MG tablet   Oral   Take 1 tablet by mouth daily.           BP 136/69  Pulse 86  Temp(Src) 98.2 F (36.8 C) (Oral)  Resp 18  Ht 6' (1.829 m)  Wt 214 lb (97.07 kg)  BMI 29.02 kg/m2  SpO2 98% Physical Exam  Nursing note and vitals reviewed. Constitutional: He is oriented to person, place, and time. He appears well-developed and well-nourished. No distress.  HENT:  Head: Normocephalic and atraumatic.  Eyes: Pupils are equal, round, and reactive to light.  Neck: Neck supple.  Cardiovascular: Normal rate and regular rhythm.   Pulmonary/Chest: Effort normal. No respiratory distress.  Musculoskeletal:  Focused examination of the right lower extremity: Erythema and petechiae noted over the medial right knee and calf, no blanching erythema, there is moderate effusion to the knee, pain with both passive and active range of motion, warmth noted to the knee joint  Lymphadenopathy:    He has no cervical adenopathy.  Neurological: He is alert and oriented to person, place, and time.  Skin: Skin is warm and dry.  Psychiatric: He has a normal mood and affect.    ED Course  ARTHOCENTESIS Date/Time: 09/25/2013 5:31 PM Performed by: Thayer Jew, F Authorized by: Thayer Jew, F Consent: Verbal consent obtained. written consent obtained. Risks and benefits: risks, benefits and alternatives were discussed Consent given by: patient Patient understanding: patient states understanding of the procedure being performed Procedure consent: procedure consent matches procedure scheduled Relevant documents: relevant documents present and verified Site marked: the operative site was  marked Required items: required blood products, implants, devices, and special equipment available Patient identity confirmed: verbally with patient Indications: possible septic joint,  pain and diagnostic evaluation  Body area: knee Local anesthesia used: yes Anesthesia: local infiltration Local anesthetic: lidocaine 2% without epinephrine Anesthetic total: 2 ml Patient sedated: no Preparation: Patient was prepped and draped in the usual sterile fashion. Needle gauge: 22 G Ultrasound guidance: no Approach: lateral Aspirate amount: 0 ml Patient tolerance: Patient tolerated the procedure well with no immediate complications. Comments: Lateral mid patellar approach was used with small gauge needle.  Joint space entered without significant aspiration of synovial fluid.  Discussed with patient contraindication for tapping right over the effusion and also hesitant to attempt multiple times 2/2 thrombocytopenia and risk for creation of hemarthrosis.  Patient stated understanding.   (including critical care time) Labs Review Labs Reviewed  CBC WITH DIFFERENTIAL - Abnormal; Notable for the following:    RBC 3.90 (*)    Hemoglobin 12.3 (*)    HCT 35.5 (*)    RDW 21.4 (*)    Platelets 45 (*)    Monocytes Relative 13 (*)    Basophils Relative 4 (*)    Basophils Absolute 0.2 (*)    All other components within normal limits  BASIC METABOLIC PANEL - Abnormal; Notable for the following:    Sodium 135 (*)    Chloride 95 (*)    Glucose, Bld 254 (*)    BUN 37 (*)    Creatinine, Ser 1.58 (*)    GFR calc non Af Amer 43 (*)    GFR calc Af Amer 50 (*)    All other components within normal limits  SEDIMENTATION RATE - Abnormal; Notable for the following:    Sed Rate 59 (*)    All other components within normal limits  BODY FLUID CULTURE  URIC ACID  PROTEIN, SYNOVIAL FLUID  GLUCOSE, SYNOVIAL FLUID  SYNOVIAL CELL COUNT + DIFF, W/ CRYSTALS  TYPE AND SCREEN  PREPARE PLATELET PHERESIS    Imaging Review US Venous Img Lower Unilateral Right  09/25/2013   CLINICAL DATA:  Right lower extremity pain and swelling.  EXAM: RIGHT LOWER EXTREMITY VENOUS DOPPLER ULTRASOUND  TECHNIQUE: Gray-scale sonography with graded compression, as well as color Doppler and duplex ultrasound, were performed to evaluate the deep venous system from the level of the common femoral vein through the popliteal and proximal calf veins. Spectral Doppler was utilized to evaluate flow at rest and with distal augmentation maneuvers.  COMPARISON:  No priors.  FINDINGS: Thrombus within deep veins:  None visualized.  Compressibility of deep veins:  Normal.  Duplex waveform respiratory phasicity:  Normal.  Duplex waveform response to augmentation:  Normal.  Venous reflux:  None visualized.  Other findings:  None visualized.  IMPRESSION: 1. No evidence of right lower extremity deep venous thrombosis.   Electronically Signed   By: Vinnie Langton M.D.   On: 09/25/2013 09:42   Dg Knee Complete 4 Views Right  09/24/2013   CLINICAL DATA:  Right knee pain and swelling and edema without history of injury  EXAM: RIGHT KNEE - COMPLETE 4+ VIEW  COMPARISON:  DG KNEE COMPLETE 4+V*R* dated 09/23/2013  FINDINGS: Four views of the right knee reveal the bones to be adequately mineralized. There is no lytic nor blastic lesion or periosteal reaction. Minimal beaking of the tibial spines is present. No large joint effusion is demonstrated. There are arterial calcifications present. A fabella is present. There may be a small effusion in the suprapatellar region.  IMPRESSION: There is no acute bony abnormality of the right knee. 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 A small joint effusion may be present.   Electronically Signed   By: David  Martinique   On: 09/24/2013 20:35     EKG Interpretation None      MDM   Final diagnoses:  Knee pain  Thrombocytopenia    Patient presents with continued right knee pain. Is not taking pain medications at  home because he did not get his prescription filled and ran out of his Dilaudid. Exam  notable for petechial rash on the medial knee which was noted yesterday. There is warmth and  mild effusion to the joint. Patient shows no signs or symptoms of systemic toxicity; however, septic joint is a consideration given the negative work workup to this point. Other considerations include gout. Patient has no known history. Repeat lab work was obtained. Platelets today 45. Arthrocentesis is needed to rule out septic arthritis and evaluate for crystals.  At this point, I discussed with the patient with his continued and worsening pain I feel arthrocentesis is needed to rule out septic arthritis.  Patient states "do whatever you have to do."  Discussed risks and benefits of the procedure given thrombocytopenia were discussed. I have recommended transfusion of one unit of platelets with goal that this would put him above 50,000.  Patient has consented to this arthrocentesis and transfusion.  Patient was typed and screened.  Arthocentesis without aspiration of joint fluid.  Unable to to tap medial joint (where effusion is present) 2/2 overlying skin changes.  Patient has no evidence of systemic infection and is nontoxic.  Will place in knee immobilizer and place on medrol dose pack.  ORtho follow-up.  Patient was given strict return precautions.    I did not refill his dilaudid Rx as requested by the patient.  After history, exam, and medical workup I feel the patient has been appropriately medically screened and is safe for discharge home. Pertinent diagnoses were discussed with the patient. Patient was given return precautions.     Merryl Hacker, MD 09/25/13 1730  Merryl Hacker, MD 09/25/13 [phone]

## 2013-09-25 NOTE — Discharge Instructions (Signed)
Arthrocentesis was attempted on her right knee. No fluid could be extracted and I was unable to go into the lateral side of the knee secondary to overlying skin changes. At this time we'll treat conservatively with steroids and pain medication. You should followup with orthopedics. You have be given a knee immobilizer. If you have any new or worsening pain, swelling, you should be further evaluated.  Thrombocytopenia Thrombocytopenia is a condition in which there is an abnormally small number of platelets in your blood. Platelets are also called thrombocytes. Platelets are needed for blood clotting. CAUSES Thrombocytopenia is caused by:   Decreased production of platelets. This can be caused by:  Aplastic anemia in which your bone marrow quits making blood cells.  Cancer in the bone marrow.  Use of certain medicines, including chemotherapy.  Infection in the bone marrow.  Heavy alcohol consumption.  Increased destruction of platelets. This can be caused by:  Certain immune diseases.  Use of certain drugs.  Certain blood clotting disorders.  Certain inherited disorders.  Certain bleeding disorders.  Pregnancy.  Having an enlarged spleen (hypersplenism). In hypersplenism, the spleen gathers up platelets from circulation. This means the platelets are not available to help with blood clotting. The spleen can enlarge due to cirrhosis or other conditions. SYMPTOMS  The symptoms of thrombocytopenia are side effects of poor blood clotting. Some of these are:  Abnormal bleeding.  Nosebleeds.  Heavy menstrual periods.  Blood in the urine or stools.  Purpura. This is a purplish discoloration in the skin produced by small bleeding vessels near the surface of the skin.  Bruising.  A rash that may be petechial. This looks like pinpoint, purplish-red spots on the skin and mucous membranes. It is caused by bleeding from small blood vessels (capillaries). DIAGNOSIS  Your caregiver  will make this diagnosis based on your exam and blood tests. Sometimes, a bone marrow study is done to look for the original cells (megakaryocytes) that make platelets. TREATMENT  Treatment depends on the cause of the condition.  Medicines may be given to help protect your platelets from being destroyed.  In some cases, a replacement (transfusion) of platelets may be required to stop or prevent bleeding.  Sometimes, the spleen must be surgically removed. HOME CARE INSTRUCTIONS   Check the skin and linings inside your mouth for bruising or bleeding as directed by your caregiver.  Check your sputum, urine, and stool for blood as directed by your caregiver.  Do not return to any activities that could cause bumps or bruises until your caregiver says it is okay.  Take extra care not to cut yourself when shaving or when using scissors, needles, knives, and other tools.  Take extra care not to burn yourself when ironing or cooking.  Ask your caregiver if it is okay for you to drink alcohol.  Only take over-the-counter or prescription medicines as directed by your caregiver.  Notify all your caregivers, including dentists and eye doctors, about your condition. SEEK IMMEDIATE MEDICAL CARE IF:   You develop active bleeding from anywhere in your body.  You develop unexplained bruising or bleeding.  You have blood in your sputum, urine, or stool. MAKE SURE YOU:  Understand these instructions.  Will watch your condition.  Will get help right away if you are not doing well or get worse. Document Released: 07/07/2005 Document Revised: 09/29/2011 Document Reviewed: 05/09/2011 [facility] Patient Information 2014 [place], [place]. Knee Pain Knee pain can be a result of an injury or other medical conditions.  Treatment will depend on the cause of your pain. HOME CARE  Only take medicine as told by your doctor.  Keep a healthy weight. Being overweight can make the knee hurt more.  Stretch  before exercising or playing sports.  If there is constant knee pain, change the way you exercise. Ask your doctor for advice.  Make sure shoes fit well. Choose the right shoe for the sport or activity.  Protect your knees. Wear kneepads if needed.  Rest when you are tired. GET HELP RIGHT AWAY IF:   Your knee pain does not stop.  Your knee pain does not get better.  Your knee joint feels hot to the touch.  You have a fever. MAKE SURE YOU:   Understand these instructions.  Will watch this condition.  Will get help right away if you are not doing well or get worse. Document Released: 10/03/2008 Document Revised: 09/29/2011 Document Reviewed: 10/03/2008 [facility] Patient Information 2014 [place], [place].

## 2013-09-25 NOTE — ED Notes (Signed)
Ice pack placed on right knee for comfort, pt updated on plan of care,

## 2013-09-25 NOTE — ED Notes (Signed)
Lab notified of need for platelets, advised that the platelets would have to come from [facility], Dr. Dina Rich notified,

## 2013-09-25 NOTE — ED Notes (Signed)
Dr Dina Rich at bedside,

## 2013-09-25 NOTE — ED Notes (Signed)
Lab called reference eta of platelets, advised that the platelets are not here yet but they will call [facility] and determine eta,

## 2013-09-25 NOTE — ED Notes (Signed)
Right knee cleaned, bandaid applied, pt tolerated well,

## 2013-09-25 NOTE — ED Notes (Signed)
Dr Dina Rich at bedside speaking with pt and family

## 2013-09-25 NOTE — ED Notes (Signed)
Pt c/o pain, redness to right knee area that extends down right calf. Was seen in er yesterday for same, had ultrasound this am with negative results, states that the pain is not any better, has not been able to have prescription filled that he was given last night due to his pharmacy ([facility]) being closed this am, pt states that he normally takes 8 mg Dilaudid twice a day for chronic back pain but ran out of that prescription on Friday and has not been able to notify his pcp for a refill. Pt has redness, swelling noted to right knee area that extends down right leg, positive distal pulse.

## 2013-09-26 LAB — PREPARE PLATELET PHERESIS: Unit division: 0

## 2014-12-07 ENCOUNTER — Encounter: Payer: Self-pay | Admitting: Gastroenterology

## 2014-12-21 ENCOUNTER — Telehealth: Payer: Self-pay | Admitting: Gastroenterology

## 2014-12-21 ENCOUNTER — Ambulatory Visit: Payer: Medicare Other | Admitting: Gastroenterology

## 2014-12-21 NOTE — Telephone Encounter (Signed)
Pt was a no show

## 2015-05-10 ENCOUNTER — Encounter (HOSPITAL_COMMUNITY): Payer: Self-pay

## 2015-05-10 ENCOUNTER — Emergency Department (HOSPITAL_COMMUNITY)
Admission: EM | Admit: 2015-05-10 | Discharge: 2015-05-10 | Disposition: A | Discharge status: Home / Self Care | Payer: Medicare Other | Attending: Emergency Medicine | Admitting: Emergency Medicine

## 2015-05-10 ENCOUNTER — Emergency Department (HOSPITAL_COMMUNITY): Payer: Medicare Other

## 2015-05-10 DIAGNOSIS — F419 Anxiety disorder, unspecified: Secondary | ICD-10-CM | POA: Insufficient documentation

## 2015-05-10 DIAGNOSIS — S3992XD Unspecified injury of lower back, subsequent encounter: Secondary | ICD-10-CM | POA: Diagnosis present

## 2015-05-10 DIAGNOSIS — Z79899 Other long term (current) drug therapy: Secondary | ICD-10-CM | POA: Insufficient documentation

## 2015-05-10 DIAGNOSIS — M545 Low back pain: Secondary | ICD-10-CM

## 2015-05-10 DIAGNOSIS — I708 Atherosclerosis of other arteries: Secondary | ICD-10-CM | POA: Insufficient documentation

## 2015-05-10 DIAGNOSIS — I10 Essential (primary) hypertension: Secondary | ICD-10-CM | POA: Diagnosis not present

## 2015-05-10 DIAGNOSIS — Z8719 Personal history of other diseases of the digestive system: Secondary | ICD-10-CM | POA: Diagnosis not present

## 2015-05-10 DIAGNOSIS — E119 Type 2 diabetes mellitus without complications: Secondary | ICD-10-CM | POA: Diagnosis not present

## 2015-05-10 DIAGNOSIS — S134XXD Sprain of ligaments of cervical spine, subsequent encounter: Secondary | ICD-10-CM | POA: Insufficient documentation

## 2015-05-10 DIAGNOSIS — Z87891 Personal history of nicotine dependence: Secondary | ICD-10-CM | POA: Diagnosis not present

## 2015-05-10 DIAGNOSIS — Z791 Long term (current) use of non-steroidal anti-inflammatories (NSAID): Secondary | ICD-10-CM | POA: Diagnosis not present

## 2015-05-10 DIAGNOSIS — S139XXD Sprain of joints and ligaments of unspecified parts of neck, subsequent encounter: Secondary | ICD-10-CM

## 2015-05-10 DIAGNOSIS — W1839XD Other fall on same level, subsequent encounter: Secondary | ICD-10-CM | POA: Diagnosis not present

## 2015-05-10 DIAGNOSIS — G8929 Other chronic pain: Secondary | ICD-10-CM

## 2015-05-10 DIAGNOSIS — N289 Disorder of kidney and ureter, unspecified: Secondary | ICD-10-CM | POA: Diagnosis not present

## 2015-05-10 DIAGNOSIS — I6523 Occlusion and stenosis of bilateral carotid arteries: Secondary | ICD-10-CM

## 2015-05-10 MED ORDER — DIAZEPAM 5 MG PO TABS
10.0000 mg | ORAL_TABLET | Freq: Once | ORAL | Status: AC
  Administered 2015-05-10: 10 mg via ORAL
  Filled 2015-05-10: qty 2

## 2015-05-10 MED ORDER — HYDROMORPHONE HCL 4 MG PO TABS: 4.0000 mg | ORAL_TABLET | ORAL | Status: DC | PRN

## 2015-05-10 MED ORDER — DIAZEPAM 10 MG PO TABS: 10.0000 mg | ORAL_TABLET | Freq: Two times a day (BID) | ORAL | Status: DC

## 2015-05-10 MED ORDER — OXYCODONE-ACETAMINOPHEN 5-325 MG PO TABS
1.0000 | ORAL_TABLET | Freq: Once | ORAL | Status: AC
  Administered 2015-05-10: 1 via ORAL
  Filled 2015-05-10: qty 1

## 2015-05-10 MED ORDER — CYCLOBENZAPRINE HCL 10 MG PO TABS
10.0000 mg | ORAL_TABLET | Freq: Once | ORAL | Status: AC
  Administered 2015-05-10: 10 mg via ORAL
  Filled 2015-05-10: qty 1

## 2015-05-10 MED ORDER — HYDROMORPHONE HCL 2 MG PO TABS
4.0000 mg | ORAL_TABLET | Freq: Once | ORAL | Status: AC
  Administered 2015-05-10: 4 mg via ORAL
  Filled 2015-05-10: qty 2

## 2015-05-10 NOTE — Discharge Instructions (Signed)
Please make an appointment with the neurosurgeon. Consider having your primary care provider order MRI scans of your neck and lower back so they are done before you see the neurosurgeon. Talk with your primary care provider about scheduling an ultrasound to evaluate your carotid arteries.  Cervical Sprain A cervical sprain is an injury in the neck in which the strong, fibrous tissues (ligaments) that connect your neck bones stretch or tear. Cervical sprains can range from mild to severe. Severe cervical sprains can cause the neck vertebrae to be unstable. This can lead to damage of the spinal cord and can result in serious nervous system problems. The amount of time it takes for a cervical sprain to get better depends on the cause and extent of the injury. Most cervical sprains heal in 1 to 3 weeks. CAUSES  Severe cervical sprains may be caused by:   Contact sport injuries (such as from football, rugby, wrestling, hockey, auto racing, gymnastics, diving, martial arts, or boxing).   Motor vehicle collisions.   Whiplash injuries. This is an injury from a sudden forward and backward whipping movement of the head and neck.  Falls.  Mild cervical sprains may be caused by:   Being in an awkward position, such as while cradling a telephone between your ear and shoulder.   Sitting in a chair that does not offer proper support.   Working at a poorly [employer] station.   Looking up or down for long periods of time.  SYMPTOMS   Pain, soreness, stiffness, or a burning sensation in the front, back, or sides of the neck. This discomfort may develop immediately after the injury or slowly, 24 hours or more after the injury.   Pain or tenderness directly in the middle of the back of the neck.   Shoulder or upper back pain.   Limited ability to move the neck.   Headache.   Dizziness.   Weakness, numbness, or tingling in the hands or arms.   Muscle spasms.   Difficulty  swallowing or chewing.   Tenderness and swelling of the neck.  DIAGNOSIS  Most of the time your health care provider can diagnose a cervical sprain by taking your history and doing a physical exam. Your health care provider will ask about previous neck injuries and any known neck problems, such as arthritis in the neck. X-rays may be taken to find out if there are any other problems, such as with the bones of the neck. Other tests, such as a CT scan or MRI, may also be needed.  TREATMENT  Treatment depends on the severity of the cervical sprain. Mild sprains can be treated with rest, keeping the neck in place (immobilization), and pain medicines. Severe cervical sprains are immediately immobilized. Further treatment is done to help with pain, muscle spasms, and other symptoms and may include:  Medicines, such as pain relievers, numbing medicines, or muscle relaxants.   Physical therapy. This may involve stretching exercises, strengthening exercises, and posture training. Exercises and improved posture can help stabilize the neck, strengthen muscles, and help stop symptoms from returning.  HOME CARE INSTRUCTIONS   Put ice on the injured area.   Put ice in a plastic bag.   Place a towel between your skin and the bag.   Leave the ice on for 15-20 minutes, 3-4 times a day.   If your injury was severe, you may have been given a cervical collar to wear. A cervical collar is a two-piece collar designed to  keep your neck from moving while it heals.  Do not remove the collar unless instructed by your health care provider.  If you have long hair, keep it outside of the collar.  Ask your health care provider before making any adjustments to your collar. Minor adjustments may be required over time to improve comfort and reduce pressure on your chin or on the back of your head.  Ifyou are allowed to remove the collar for cleaning or bathing, follow your health care provider's instructions on  how to do so safely.  Keep your collar clean by wiping it with mild soap and water and drying it completely. If the collar you have been given includes removable pads, remove them every 1-2 days and hand wash them with soap and water. Allow them to air dry. They should be completely dry before you wear them in the collar.  If you are allowed to remove the collar for cleaning and bathing, wash and dry the skin of your neck. Check your skin for irritation or sores. If you see any, tell your health care provider.  Do not drive while wearing the collar.   Only take over-the-counter or prescription medicines for pain, discomfort, or fever as directed by your health care provider.   Keep all follow-up appointments as directed by your health care provider.   Keep all physical therapy appointments as directed by your health care provider.   Make any needed adjustments to your workstation to promote good posture.   Avoid positions and activities that make your symptoms worse.   Warm up and stretch before being active to help prevent problems.  SEEK MEDICAL CARE IF:   Your pain is not controlled with medicine.   You are unable to decrease your pain medicine over time as planned.   Your activity level is not improving as expected.  SEEK IMMEDIATE MEDICAL CARE IF:   You develop any bleeding.  You develop stomach upset.  You have signs of an allergic reaction to your medicine.   Your symptoms get worse.   You develop new, unexplained symptoms.   You have numbness, tingling, weakness, or paralysis in any part of your body.  MAKE SURE YOU:   Understand these instructions.  Will watch your condition.  Will get help right away if you are not doing well or get worse.   This information is not intended to replace advice given to you by your health care provider. Make sure you discuss any questions you have with your health care provider.   Document Released: 05/04/2007  Document Revised: 07/12/2013 Document Reviewed: 01/12/2013 Elsevier Interactive Patient Education 2016 [employer]  Back Pain, Adult Back pain is very common in adults.The cause of back pain is rarely dangerous and the pain often gets better over time.The cause of your back pain may not be known. Some common causes of back pain include:  Strain of the muscles or ligaments supporting the spine.  Wear and tear (degeneration) of the spinal disks.  Arthritis.  Direct injury to the back. For many people, back pain may return. Since back pain is rarely dangerous, most people can learn to manage this condition on their own. HOME CARE INSTRUCTIONS Watch your back pain for any changes. The following actions may help to lessen any discomfort you are feeling:  Remain active. It is stressful on your back to sit or stand in one place for long periods of time. Do not sit, drive, or stand in one place for more  than 30 minutes at a time. Take short walks on even surfaces as soon as you are able.Try to increase the length of time you walk each day.  Exercise regularly as directed by your health care provider. Exercise helps your back heal faster. It also helps avoid future injury by keeping your muscles strong and flexible.  Do not stay in bed.Resting more than 1-2 days can delay your recovery.  Pay attention to your body when you bend and lift. The most comfortable positions are those that put less stress on your recovering back. Always use proper lifting techniques, including:  Bending your knees.  Keeping the load close to your body.  Avoiding twisting.  Find a comfortable position to sleep. Use a firm mattress and lie on your side with your knees slightly bent. If you lie on your back, put a pillow under your knees.  Avoid feeling anxious or stressed.Stress increases muscle tension and can worsen back pain.It is important to recognize when you are anxious or stressed and learn ways to  manage it, such as with exercise.  Take medicines only as directed by your health care provider. Over-the-counter medicines to reduce pain and inflammation are often the most helpful.Your health care provider may prescribe muscle relaxant drugs.These medicines help dull your pain so you can more quickly return to your normal activities and healthy exercise.  Apply ice to the injured area:  Put ice in a plastic bag.  Place a towel between your skin and the bag.  Leave the ice on for 20 minutes, 2-3 times a day for the first 2-3 days. After that, ice and heat may be alternated to reduce pain and spasms.  Maintain a healthy weight. Excess weight puts extra stress on your back and makes it difficult to maintain good posture. SEEK MEDICAL CARE IF:  You have pain that is not relieved with rest or medicine.  You have increasing pain going down into the legs or buttocks.  You have pain that does not improve in one week.  You have night pain.  You lose weight.  You have a fever or chills. SEEK IMMEDIATE MEDICAL CARE IF:   You develop new bowel or bladder control problems.  You have unusual weakness or numbness in your arms or legs.  You develop nausea or vomiting.  You develop abdominal pain.  You feel faint.   This information is not intended to replace advice given to you by your health care provider. Make sure you discuss any questions you have with your health care provider.   Document Released: 07/07/2005 Document Revised: 07/28/2014 Document Reviewed: 11/08/2013 Elsevier Interactive Patient Education 2016 [employer] Inc.  Diazepam tablets What is this medicine? DIAZEPAM (dye AZ e pam) is a benzodiazepine. It is used to treat anxiety and nervousness. It also can help treat alcohol withdrawal, relax muscles, and treat certain types of seizures. This medicine may be used for other purposes; ask your health care provider or pharmacist if you have questions. What should I tell  my health care provider before I take this medicine? They need to know if you have any of these conditions -an alcohol or drug abuse problem -bipolar disorder, depression, psychosis or other mental health condition -glaucoma -kidney or liver disease -lung or breathing disease -myasthenia gravis -Parkinson's disease -seizures or a history of seizures -suicidal thoughts -an unusual or allergic reaction to diazepam, other benzodiazepines, foods, dyes, or preservatives -pregnant or trying to get pregnant -breast-feeding How should I use this medicine? Take  this medicine by mouth with a glass of water. Follow the directions on the prescription label. If this medicine upsets your stomach, take it with food or milk. Take your doses at regular intervals. Do not take your medicine more often than directed. If you have been taking this medicine regularly for some time, do not suddenly stop taking it. You must gradually reduce the dose or you may get severe side effects. Ask your doctor or health care professional for advice. Even after you stop taking this medicine it can still affect your body for several days. Talk to your pediatrician regarding the use of this medicine in children. Special care may be needed. Overdosage: If you think you have taken too much of this medicine contact a poison control center or emergency room at once. NOTE: This medicine is only for you. Do not share this medicine with others. What if I miss a dose? If you miss a dose, take it as soon as you can. If it is almost time for your next dose, take only that dose. Do not take double or extra doses. What may interact with this medicine? -cimetidine -grapefruit juice -herbal or dietary supplements like kava kava, melatonin, St. John's Wort, or valerian -medicines for anxiety or sleeping problems, like alprazolam, lorazepam, or triazolam -medicines for depression, mental problems or psychiatric disturbances -medicines for HIV  infection or AIDS -prescription pain medicines -rifampin, rifapentine, or rifabutin -some medicines for seizures like carbamazepine, phenobarbital, phenytoin, or primidone This list may not describe all possible interactions. Give your health care provider a list of all the medicines, herbs, non-prescription drugs, or dietary supplements you use. Also tell them if you smoke, drink alcohol, or use illegal drugs. Some items may interact with your medicine. What should I watch for while using this medicine? Visit your doctor or health care professional for regular checks on your progress. Your body can become dependent on this medicine. Ask your doctor or health care professional if you still need to take it. You may get drowsy or dizzy. Do not drive, use machinery, or do anything that needs mental alertness until you know how this medicine affects you. To reduce the risk of dizzy and fainting spells, do not stand or sit up quickly, especially if you are an older patient. Alcohol may increase dizziness and drowsiness. Avoid alcoholic drinks. Do not treat yourself for coughs, colds or allergies without asking your doctor or health care professional for advice. Some ingredients can increase possible side effects. What side effects may I notice from receiving this medicine? Side effects that you should report to your doctor or health care professional as soon as possible: -allergic reactions like skin rash, itching or hives, swelling of the face, lips, or tongue -angry, confused, depressed, other mood changes -breathing problems -feeling faint or lightheaded, falls -muscle cramps -problems with balance, talking, walking -restlessness -tremors -trouble passing urine or change in the amount of urine -unusually weak or tired Side effects that usually do not require medical attention (report to your doctor or health care professional if they continue or are bothersome): -difficulty sleeping,  nightmares -dizziness, drowsiness, clumsiness, or unsteadiness, a hangover effect -headache -nausea, vomiting This list may not describe all possible side effects. Call your doctor for medical advice about side effects. You may report side effects to FDA at 1-800-FDA-1088. Where should I keep my medicine? Keep out of the reach of children. This medicine can be abused. Keep your medicine in a safe place to protect it from  theft. Do not share this medicine with anyone. Selling or giving away this medicine is dangerous and against the law. This medicine may cause accidental overdose and death if taken by other adults, children, or pets. Mix any unused medicine with a substance like cat litter or coffee grounds. Then throw the medicine away in a sealed container like a sealed bag or a coffee can with a lid. Do not use the medicine after the expiration date. Store at room temperature between 15 and 30 degrees C (59 and 86 degrees F). Protect from light. Keep container tightly closed. NOTE: This sheet is a summary. It may not cover all possible information. If you have questions about this medicine, talk to your doctor, pharmacist, or health care provider.    2016, Elsevier/Gold Standard. (2014-03-28 15:16:42)  Hydromorphone tablets What is this medicine? HYDROMORPHONE (hye droe MOR fone) is a pain reliever. It is used to treat moderate to severe pain. This medicine may be used for other purposes; ask your health care provider or pharmacist if you have questions. What should I tell my health care provider before I take this medicine? They need to know if you have any of these conditions: -brain tumor -drug abuse or addiction -head injury -heart disease -frequently drink alcohol containing drinks -kidney disease or problems going to the bathroom -liver disease -lung disease, asthma, or breathing problems -mental problems -an allergic or unusual reaction to lactose, hydromorphone, other opioid  analgesics, other medicines, sulfites, foods, dyes, or preservatives -pregnant or trying to get pregnant -breast-feeding How should I use this medicine? Take this medicine by mouth with a glass of water. If the medicine upsets your stomach, take it with food or milk. Follow the directions on the prescription label. Do not take more medicine than you are told to take. Talk to your pediatrician regarding the use of this medicine in children. Special care may be needed. Overdosage: If you think you have taken too much of this medicine contact a poison control center or emergency room at once. NOTE: This medicine is only for you. Do not share this medicine with others. What if I miss a dose? If you miss a dose, take it as soon as you can. If it is almost time for your next dose, take only that dose. Do not take double or extra doses. What may interact with this medicine? -alcohol -antihistamines for allergy, cough and cold -medicines for anesthesia -medicines for depression, anxiety, or psychotic disturbances -medicines for sleep -muscle relaxants -naltrexone -narcotic medicines (opiates) for pain -phenothiazines like chlorpromazine, mesoridazine, prochlorperazine, thioridazine -tramadol This list may not describe all possible interactions. Give your health care provider a list of all the medicines, herbs, non-prescription drugs, or dietary supplements you use. Also tell them if you smoke, drink alcohol, or use illegal drugs. Some items may interact with your medicine. What should I watch for while using this medicine? Tell your doctor or health care professional if your pain does not go away, if it gets worse, or if you have new or a different type of pain. You may develop tolerance to the medicine. Tolerance means that you will need a higher dose of the medicine for pain relief. Tolerance is normal and is expected if you take this medicine for a long time. Do not suddenly stop taking your  medicine because you may develop a severe reaction. Your body becomes used to the medicine. This does NOT mean you are addicted. Addiction is a behavior related to getting and  using a drug for a non-medical reason. If you have pain, you have a medical reason to take pain medicine. Your doctor will tell you how much medicine to take. If your doctor wants you to stop the medicine, the dose will be slowly lowered over time to avoid any side effects. You may get drowsy or dizzy. Do not drive, use machinery, or do anything that needs mental alertness until you know how this medicine affects you. Do not stand or sit up quickly, especially if you are an older patient. This reduces the risk of dizzy or fainting spells. Alcohol may interfere with the effect of this medicine. Avoid alcoholic drinks. There are different types of narcotic medicines (opiates) for pain. If you take more than one type at the same time, you may have more side effects. Give your health care provider a list of all medicines you use. Your doctor will tell you how much medicine to take. Do not take more medicine than directed. Call emergency for help if you have problems breathing. This medicine will cause constipation. Try to have a bowel movement at least every 2 to 3 days. If you do not have a bowel movement for 3 days, call your doctor or health care professional. Your mouth may get dry. Chewing sugarless gum or sucking hard candy, and drinking plenty of water may help. Contact your doctor if the problem does not go away or is severe. What side effects may I notice from receiving this medicine? Side effects that you should report to your doctor or health care professional as soon as possible: -allergic reactions like skin rash, itching or hives, swelling of the face, lips, or tongue -breathing problems -changes in vision -confusion -feeling faint or lightheaded, falls -seizures -slow or fast heartbeat -trouble passing urine or change  in the amount of urine -trouble with balance, talking, walking -unusually weak or tired Side effects that usually do not require medical attention (report to your doctor or health care professional if they continue or are bothersome): -difficulty sleeping -drowsiness -dry mouth -flushing -headache -itching -loss of appetite -nausea, vomiting This list may not describe all possible side effects. Call your doctor for medical advice about side effects. You may report side effects to FDA at 1-800-FDA-1088. Where should I keep my medicine? Keep out of the reach of children. This medicine can be abused. Keep your medicine in a safe place to protect it from theft. Do not share this medicine with anyone. Selling or giving away this medicine is dangerous and against the law. Store at room temperature between 15 and 30 degrees C (59 and 86 degrees F). Keep container tightly closed. Protect from light. This medicine may cause accidental overdose and death if it is taken by other adults, children, or pets. Flush any unused medicine down the toilet to reduce the chance of harm. Do not use the medicine after the expiration date. NOTE: This sheet is a summary. It may not cover all possible information. If you have questions about this medicine, talk to your doctor, pharmacist, or health care provider.    2016, Elsevier/Gold Standard. (2013-02-08 09:50:15)

## 2015-05-10 NOTE — ED Notes (Signed)
Pt states he fell off of a trailer approx 3 weeks ago.  Pt states he went to [facility] and thinks he had x-rays.  Pt states he continues to have severe pain to his lower back and neck.  Pt has been taking otc meds without relief

## 2015-05-10 NOTE — ED Notes (Signed)
Patient verbalizes understanding of discharge instructions, prescription medications, home care and follow up care. Patient ambulatory out of department with walker escorted by spouse

## 2015-05-10 NOTE — ED Provider Notes (Signed)
CSN: [id number]     Arrival date & time 05/10/15  0258 History   First MD Initiated Contact with Patient 05/10/15 [phone]     Chief Complaint  Patient presents with  . Back Pain     (Consider location/radiation/quality/duration/timing/severity/associated sxs/prior Treatment) Patient is a 69 y.o. male presenting with back pain. The history is provided by the patient.  Back Pain He has a long history of back pain with having had back surgery 30 years ago. Proximally 3 weeks ago, he fell off of his pontoon boat and injured his lower back and neck. He was seen at emergency department at [facility] and had a CT of his neck and was referred back to his PCP. His PCP had given him a prescription for hydrocodone which has not given him any pain relief. Is complaining of ongoing pain in his neck and some increasing pain in his lower back. Pain is severe and he rates at 10/10. Is worse with any movement. He denies any weakness, numbness, tingling. He denies any bowel or bladder dysfunction. He does state that his PCP had talked with him about referral to a pain management clinic. Today, pain got severe and he dropped down to his knees because of pain. He denies loss of consciousness.  Past Medical History  Diagnosis Date  . Diabetes mellitus   . Hypertension   . Chronic back pain   . Anxiety   . GERD (gastroesophageal reflux disease)   . Gastritis   . Renal insufficiency    Past Surgical History  Procedure Laterality Date  . Back surgery    . Colonoscopy with propofol N/A 02/01/2013    SLF: 1. 23 colon polyps removed. 2 retrieved. 2. Mild diverticulosis in teh sigmoid colon 3. Small internal hemorrhoids 4. The colon is redundant   . Esophagogastroduodenoscopy (egd) with propofol N/A 02/01/2013    SLF: 1. Moderate erosive gastritis  . Polypectomy N/A 02/01/2013    Procedure: POLYPECTOMY;  Surgeon: Danie Binder, MD;  Location: [facility];  Service: Endoscopy;  Laterality: N/A;   Family History   Problem Relation Age of Onset  . Colon cancer Neg Hx    Social History  Substance Use Topics  . Smoking status: Former Smoker -- 1.00 packs/day for 20 years    Types: Cigars    Quit date: 08/04/2013  . Smokeless tobacco: Never Used  . Alcohol Use: No     Comment: Remote history of ETOH abuse, "when I was young and dumb"     Review of Systems  Musculoskeletal: Positive for back pain.  All other systems reviewed and are negative.     Allergies  Review of patient's allergies indicates no known allergies.  Home Medications   Prior to Admission medications   Medication Sig Start Date End Date Taking? Authorizing Provider  lisinopril (PRINIVIL,ZESTRIL) 20 MG tablet Take 1 tablet by mouth daily. 09/08/13  Yes Historical Provider, MD  LORazepam (ATIVAN) 1 MG tablet Take 1 mg by mouth 5 (five) times daily.   Yes Historical Provider, MD  metFORMIN (GLUCOPHAGE) 500 MG tablet Take 1,000 mg by mouth 2 (two) times daily with a meal.  12/19/12  Yes Historical Provider, MD  naproxen (NAPROSYN) 250 MG tablet Take by mouth 2 (two) times daily with a meal.   Yes Historical Provider, MD  omeprazole (PRILOSEC) 20 MG capsule Take 20 mg by mouth daily.  01/14/13  Yes Historical Provider, MD  tiZANidine (ZANAFLEX) 4 MG tablet Take 1 tablet by mouth 2 (  two) times daily as needed for muscle spasms.  09/09/13  Yes Historical Provider, MD  DULoxetine (CYMBALTA) 60 MG capsule Take 1 capsule by mouth 2 (two) times daily. 09/19/13   Historical Provider, MD  HYDROmorphone (DILAUDID) 4 MG tablet Take 8 mg by mouth 2 (two) times daily.  01/06/13   Historical Provider, MD  methylPREDNIsolone (MEDROL DOSPACK) 4 MG tablet follow package directions 09/25/13   Merryl Hacker, MD  oxyCODONE-acetaminophen (PERCOCET/ROXICET) 5-325 MG per tablet 1 or 2 tabs PO q6h prn pain 09/24/13   Francine Graven, DO  predniSONE (DELTASONE) 50 MG tablet Take 1 tablet by mouth daily.  09/23/13   Historical Provider, MD   BP 149/75 mmHg   Pulse 60  Temp(Src) 97.6 F (36.4 C) (Oral)  Resp 20  Ht 6' (1.829 m)  Wt 212 lb (96.163 kg)  BMI 28.75 kg/m2  SpO2 97% Physical Exam  Nursing note and vitals reviewed.  68 year old male, resting comfortably and in no acute distress. Vital signs are significant for hypertension. Oxygen saturation is 97%, which is normal. Head is normocephalic and atraumatic. PERRLA, EOMI. Oropharynx is clear. Neck is moderately tender in the mid and lower cervical region without point tenderness. There is no adenopathy or JVD. Back is markedly tender in the low lumbar area. Shave leg raise is positive bilaterally at 15. There is moderate bilateral paralumbar spasm. There is no CVA tenderness. Lungs are clear without rales, wheezes, or rhonchi. Chest is nontender. Heart has regular rate and rhythm without murmur. Abdomen is soft, flat, nontender without masses or hepatosplenomegaly and peristalsis is normoactive. Extremities have no cyanosis or edema, full range of motion is present. Skin is warm and dry without rash. Neurologic: Mental status is normal, cranial nerves are intact, there are no motor or sensory deficits.  ED Course  Procedures (including critical care time)  Imaging Review Dg Lumbar Spine Complete  05/10/2015  CLINICAL DATA:  Status post fall off trailer 3 weeks ago, with lower back pain. Initial encounter. EXAM: LUMBAR SPINE - COMPLETE 4+ VIEW COMPARISON:  CT of the abdomen and pelvis performed 12/15/2014, and lumbar spine radiographs performed 11/25/2014 FINDINGS: There is no evidence of fracture or subluxation. Vertebral bodies demonstrate normal height and alignment. Intervertebral disc spaces are preserved. The visualized neural foramina are grossly unremarkable in appearance. The visualized bowel gas pattern is unremarkable in appearance; air and stool are noted within the colon. The sacroiliac joints are within normal limits. Diffuse vascular calcifications are seen. A large soft  tissue calcification is again noted at the left flank. IMPRESSION: 1. No evidence of fracture or subluxation along the lumbar spine. 2. Diffuse vascular calcifications seen. Electronically Signed   By: Garald Balding M.D.   On: 05/10/2015 04:14   Ct Cervical Spine Wo Contrast  05/10/2015  CLINICAL DATA:  [place] 3 weeks ago, with severe neck pain. Initial encounter. EXAM: CT CERVICAL SPINE WITHOUT CONTRAST TECHNIQUE: Multidetector CT imaging of the cervical spine was performed without intravenous contrast. Multiplanar CT image reconstructions were also generated. COMPARISON:  CT of the cervical spine performed 04/03/2015 FINDINGS: There is no evidence of fracture or subluxation. Vertebral bodies demonstrate normal height and alignment. Intervertebral disc spaces are preserved. A few small anterior and posterior disc osteophyte complexes are noted along the lower cervical spine. Prevertebral soft tissues are within normal limits. The visualized neural foramina are grossly unremarkable. The thyroid gland is unremarkable in appearance. The visualized lung apices are clear. Calcification is noted at  the carotid bifurcations bilaterally. The visualized portions of the brain are grossly unremarkable. IMPRESSION: 1. No evidence of fracture or subluxation along the cervical spine. 2. Calcification at the carotid bifurcations bilaterally. Carotid ultrasound would be helpful for further evaluation, when and as deemed clinically appropriate. Electronically Signed   By: Garald Balding M.D.   On: 05/10/2015 04:03   I have personally reviewed and evaluated these images and lab results as part of my medical decision-making.  MDM   Final diagnoses:  Acute exacerbation of chronic low back pain  Cervical sprain, subsequent encounter  Atherosclerosis of both carotid arteries  Renal insufficiency    Exacerbation of chronic low back pain and neck pain. I was able to pull up his CT scan from [facility].  Because of ongoing pain, CT will be repeated. He never had imaging of his lumbar spine following the fall so plain films will be obtained of the lumbar spine. He is given a dose of oxycodone-acetaminophen and cyclobenzaprine. Old records were reviewed and it is noted that he has renal insufficiency with last creatinine on record 1.58 in March 2015. Therefore, NSAIDs will not be given.  He had virtually no relief of pain with above noted treatment. I reviewed his record on the [place] controlled substance reporting website and he had been getting monthly prescriptions for hydromorphone 4 mg tablets through June. He was taking 90 tablets a month and is likely to have significant tolerance to narcotics. X-rays and CT scans show no acute injury but significant vascular calcifications including the carotid bulb bilaterally. Patient is advised of this finding and advised to arrange for outpatient carotid duplex scan through his PCP. He is referred to neurosurgery for evaluation of his back and neck pain with recommendation that his PCP order MRI scans so they're available for the neurosurgeon to see when he eventually gets the appointment. Also consider referral to a pain management clinic. Patient is advised to stop smoking in light of extensive vascular calcification    Delora Fuel, MD [phone] [id number]

## 2015-08-01 DIAGNOSIS — M50322 Other cervical disc degeneration at C5-C6 level: Secondary | ICD-10-CM | POA: Diagnosis not present

## 2015-08-08 ENCOUNTER — Encounter (HOSPITAL_COMMUNITY): Payer: Self-pay | Admitting: Emergency Medicine

## 2015-08-08 ENCOUNTER — Emergency Department (HOSPITAL_COMMUNITY)
Admission: EM | Admit: 2015-08-08 | Discharge: 2015-08-08 | Disposition: A | Discharge status: Home / Self Care | Payer: Medicare Other | Attending: Emergency Medicine | Admitting: Emergency Medicine

## 2015-08-08 DIAGNOSIS — Z87448 Personal history of other diseases of urinary system: Secondary | ICD-10-CM | POA: Insufficient documentation

## 2015-08-08 DIAGNOSIS — M5080 Other cervical disc disorders, unspecified cervical region: Secondary | ICD-10-CM | POA: Insufficient documentation

## 2015-08-08 DIAGNOSIS — M545 Low back pain: Secondary | ICD-10-CM | POA: Insufficient documentation

## 2015-08-08 DIAGNOSIS — I10 Essential (primary) hypertension: Secondary | ICD-10-CM | POA: Insufficient documentation

## 2015-08-08 DIAGNOSIS — E119 Type 2 diabetes mellitus without complications: Secondary | ICD-10-CM | POA: Diagnosis not present

## 2015-08-08 DIAGNOSIS — Z87891 Personal history of nicotine dependence: Secondary | ICD-10-CM | POA: Insufficient documentation

## 2015-08-08 DIAGNOSIS — F419 Anxiety disorder, unspecified: Secondary | ICD-10-CM | POA: Diagnosis not present

## 2015-08-08 DIAGNOSIS — M5030 Other cervical disc degeneration, unspecified cervical region: Secondary | ICD-10-CM

## 2015-08-08 DIAGNOSIS — Z7984 Long term (current) use of oral hypoglycemic drugs: Secondary | ICD-10-CM | POA: Diagnosis not present

## 2015-08-08 DIAGNOSIS — K219 Gastro-esophageal reflux disease without esophagitis: Secondary | ICD-10-CM | POA: Diagnosis not present

## 2015-08-08 DIAGNOSIS — Z79899 Other long term (current) drug therapy: Secondary | ICD-10-CM | POA: Insufficient documentation

## 2015-08-08 DIAGNOSIS — G8929 Other chronic pain: Secondary | ICD-10-CM | POA: Diagnosis not present

## 2015-08-08 DIAGNOSIS — M542 Cervicalgia: Secondary | ICD-10-CM | POA: Diagnosis present

## 2015-08-08 MED ORDER — CYCLOBENZAPRINE HCL 5 MG PO TABS: 5.0000 mg | ORAL_TABLET | Freq: Three times a day (TID) | ORAL | Status: DC | PRN

## 2015-08-08 MED ORDER — OXYCODONE-ACETAMINOPHEN 5-325 MG PO TABS: 1.0000 | ORAL_TABLET | ORAL | Status: DC | PRN

## 2015-08-08 MED ORDER — NAPROXEN 500 MG PO TABS: 500.0000 mg | ORAL_TABLET | Freq: Two times a day (BID) | ORAL | Status: DC

## 2015-08-08 MED ORDER — OXYCODONE-ACETAMINOPHEN 5-325 MG PO TABS
2.0000 | ORAL_TABLET | Freq: Once | ORAL | Status: AC
  Administered 2015-08-08: 2 via ORAL
  Filled 2015-08-08: qty 2

## 2015-08-08 MED ORDER — ONDANSETRON 8 MG PO TBDP
8.0000 mg | ORAL_TABLET | Freq: Once | ORAL | Status: AC
  Administered 2015-08-08: 8 mg via ORAL
  Filled 2015-08-08: qty 1

## 2015-08-08 MED ORDER — HYDROMORPHONE HCL 1 MG/ML IJ SOLN
1.0000 mg | Freq: Once | INTRAMUSCULAR | Status: AC
  Administered 2015-08-08: 1 mg via INTRAMUSCULAR
  Filled 2015-08-08: qty 1

## 2015-08-08 MED ORDER — HYDROCODONE-ACETAMINOPHEN 5-325 MG PO TABS
2.0000 | ORAL_TABLET | Freq: Once | ORAL | Status: DC
  Filled 2015-08-08: qty 2

## 2015-08-08 MED ORDER — HYDROCODONE-ACETAMINOPHEN 5-325 MG PO TABS: 1.0000 | ORAL_TABLET | ORAL | Status: DC | PRN

## 2015-08-08 NOTE — Discharge Instructions (Signed)
Degenerative Disk Disease  Degenerative disk disease is a condition caused by the changes that occur in spinal disks as you grow older. Spinal disks are soft and compressible disks located between the bones of your spine (vertebrae). These disks act like shock absorbers. Degenerative disk disease can affect the whole spine. However, the neck and lower back are most commonly affected. Many changes can occur in the spinal disks with aging, such as:  · The spinal disks may dry and shrink.  · Small tears may occur in the tough, outer covering of the disk (annulus).  · The disk space may become smaller due to loss of water.  · Abnormal growths in the bone (spurs) may occur. This can put pressure on the nerve roots exiting the spinal canal, causing pain.  · The spinal canal may become narrowed.  RISK FACTORS   · Being overweight.  · Having a family history of degenerative disk disease.  · Smoking.  · There is increased risk if you are doing heavy lifting or have a sudden injury.  SIGNS AND SYMPTOMS   Symptoms vary from person to person and may include:  · Pain that varies in intensity. Some people have no pain, while others have severe pain. The location of the pain depends on the part of your backbone that is affected.  ¨ You will have neck or arm pain if a disk in the neck area is affected.  ¨ You will have pain in your back, buttocks, or legs if a disk in the lower back is affected.  · Pain that becomes worse while bending, reaching up, or with twisting movements.  · Pain that may start gradually and then get worse as time passes. It may also start after a major or minor injury.  · Numbness or tingling in the arms or legs.  DIAGNOSIS   Your health care provider will ask you about your symptoms and about activities or habits that may cause the pain. He or she may also ask about any injuries, diseases, or treatments you have had. Your health care provider will examine you to check for the range of movement that is  possible in the affected area, to check for strength in your extremities, and to check for sensation in the areas of the arms and legs supplied by different nerve roots. You may also have:   · An X-ray of the spine.  · Other imaging tests, such as MRI.  TREATMENT   Your health care provider will advise you on the best plan for treatment. Treatment may include:  · Medicines.  · Rehabilitation exercises.  HOME CARE INSTRUCTIONS   · Follow proper lifting and walking techniques as advised by your health care provider.  · Maintain good posture.  · Exercise regularly as advised by your health care provider.  · Perform relaxation exercises.  · Change your sitting, standing, and sleeping habits as advised by your health care provider.  · Change positions frequently.  · Lose weight or maintain a healthy weight as advised by your health care provider.  · Do not use any tobacco products, including cigarettes, chewing tobacco, or electronic cigarettes. If you need help quitting, ask your health care provider.  · Wear supportive footwear.  · Take medicines only as directed by your health care provider.  SEEK MEDICAL CARE IF:   · Your pain does not go away within 1-4 weeks.  · You have significant appetite or weight loss.  SEEK IMMEDIATE MEDICAL CARE IF:   ·   Your pain is severe.  · You notice weakness in your arms, hands, or legs.  · You begin to lose control of your bladder or bowel movements.  · You have fevers or night sweats.  MAKE SURE YOU:   · Understand these instructions.  · Will watch your condition.  · Will get help right away if you are not doing well or get worse.     This information is not intended to replace advice given to you by your health care provider. Make sure you discuss any questions you have with your health care provider.     Document Released: 05/04/2007 Document Revised: 07/28/2014 Document Reviewed: 11/08/2013  Elsevier Interactive Patient Education ©2016 [employer]

## 2015-08-08 NOTE — ED Notes (Signed)
Pt reports chronic neck problems since October, pt reports he has deteriorating disc, had MRI last month.  Pt reports wanting something for pain, states he has never been prescribed pain medication for this.  Pt alert and oriented.

## 2015-08-08 NOTE — ED Provider Notes (Signed)
CSN: [id number]     Arrival date & time 08/08/15  1051 History   First MD Initiated Contact with Patient 08/08/15 1130     Chief Complaint  Patient presents with  . Neck Pain     (Consider location/radiation/quality/duration/timing/severity/associated sxs/prior Treatment) The history is provided by the patient and the spouse.   [name] is a 70 y.o. male with a 3 month history of neck pain without recognized trauma with cervical disk degenerative disease discovered by MRI completed 11/16 (presents with MRI report).  He has been evaluated by Dr. Rolena Infante with [facility] who referred him to Dr. Nelva Bush, having received one set of steroid injections 2 weeks ago with no relief of symptoms.  His pain is constant, worsened with movement, particularly rightward head rotation.  He endorses right forearm and hand tingling which has been intermittent and present since he first started having pain 3 months ago.  He has taken tylenol without pain relief.  He has placed a call to Dr. Jeralyn Ruths office yesterday, but has not had a call back yet and his pain was too severe to wait further.  He was hoping for pain medicine until he can get back in with Dr. Nelva Bush (is supposed to be seen within the next week).       Past Medical History  Diagnosis Date  . Diabetes mellitus   . Hypertension   . Chronic back pain   . Anxiety   . GERD (gastroesophageal reflux disease)   . Gastritis   . Renal insufficiency    Past Surgical History  Procedure Laterality Date  . Back surgery    . Colonoscopy with propofol N/A 02/01/2013    SLF: 1. 23 colon polyps removed. 2 retrieved. 2. Mild diverticulosis in teh sigmoid colon 3. Small internal hemorrhoids 4. The colon is redundant   . Esophagogastroduodenoscopy (egd) with propofol N/A 02/01/2013    SLF: 1. Moderate erosive gastritis  . Polypectomy N/A 02/01/2013    Procedure: POLYPECTOMY;  Surgeon: Danie Binder, MD;  Location: [facility];  Service: Endoscopy;   Laterality: N/A;   Family History  Problem Relation Age of Onset  . Colon cancer Neg Hx    Social History  Substance Use Topics  . Smoking status: Former Smoker -- 1.00 packs/day for 20 years    Types: Cigars    Quit date: 08/04/2013  . Smokeless tobacco: Never Used  . Alcohol Use: No     Comment: Remote history of ETOH abuse, "when I was young and dumb"     Review of Systems  Constitutional: Negative for fever and chills.  Musculoskeletal: Positive for neck pain and neck stiffness. Negative for myalgias.  Neurological: Negative for weakness and numbness.      Allergies  Hydrocodone  Home Medications   Prior to Admission medications   Medication Sig Start Date End Date Taking? Authorizing Provider  acetaminophen (TYLENOL) 500 MG tablet Take 500 mg by mouth every 6 (six) hours as needed for mild pain or moderate pain.   Yes Historical Provider, MD  clonazePAM (KLONOPIN) 0.5 MG tablet Take 0.5 mg by mouth 4 (four) times daily.   Yes Historical Provider, MD  DULoxetine (CYMBALTA) 60 MG capsule Take 1 capsule by mouth daily.  09/19/13  Yes Historical Provider, MD  lisinopril (PRINIVIL,ZESTRIL) 20 MG tablet Take 1 tablet by mouth daily. 09/08/13  Yes Historical Provider, MD  metFORMIN (GLUCOPHAGE) 500 MG tablet Take 1,000 mg by mouth 2 (two) times daily with a meal.  12/19/12  Yes Historical Provider, MD  omeprazole (PRILOSEC) 20 MG capsule Take 20 mg by mouth daily.  01/14/13  Yes Historical Provider, MD  cyclobenzaprine (FLEXERIL) 5 MG tablet Take 1 tablet (5 mg total) by mouth 3 (three) times daily as needed for muscle spasms. 08/08/15   Evalee Jefferson, PA-C  diazepam (VALIUM) 10 MG tablet Take 1 tablet (10 mg total) by mouth 2 (two) times daily. Patient not taking: Reported on 08/08/2015 0000000   Delora Fuel, MD  HYDROmorphone (DILAUDID) 4 MG tablet Take 1 tablet (4 mg total) by mouth every 4 (four) hours as needed for severe pain. Patient not taking: Reported on 08/08/2015 0000000    Delora Fuel, MD  naproxen (NAPROSYN) 500 MG tablet Take 1 tablet (500 mg total) by mouth 2 (two) times daily. 08/08/15   Evalee Jefferson, PA-C  oxyCODONE-acetaminophen (PERCOCET/ROXICET) 5-325 MG tablet Take 1 tablet by mouth every 4 (four) hours as needed. 08/08/15   Evalee Jefferson, PA-C  traMADol (ULTRAM) 50 MG tablet Take 50 mg by mouth 3 (three) times daily.    Historical Provider, MD   BP 169/79 mmHg  Pulse 74  Temp(Src) 97.8 F (36.6 C) (Oral)  Resp 18  Ht 6' (1.829 m)  Wt 94.802 kg  BMI 28.34 kg/m2  SpO2 99% Physical Exam  Constitutional: He appears well-developed and well-nourished.  HENT:  Head: Normocephalic.  Eyes: Conjunctivae are normal.  Neck: Neck supple. Muscular tenderness present. No spinous process tenderness present.    ttp right paracervical and midline pain.  No stepoffs.  Pain worsened with head rotation rightward, with reduced range of motion.  Cardiovascular: Normal rate and intact distal pulses.   Pulmonary/Chest: Effort normal.  Musculoskeletal: Normal range of motion. He exhibits no edema.       Lumbar back: He exhibits tenderness. He exhibits no swelling, no edema and no spasm.  Lymphadenopathy:    He has no cervical adenopathy.  Neurological: He is alert. He has normal strength. He displays no atrophy and no tremor. A sensory deficit is present. Gait normal.  Reflex Scores:      Bicep reflexes are 2+ on the right side and 2+ on the left side. Equal grip strength.  Decreased sensation to fine touch right forearm C6 distribution.  Full strength with flexion and extension of wrist and elbow.  Skin: Skin is warm and dry.  Psychiatric: He has a normal mood and affect.  Nursing note and vitals reviewed.   ED Course  Procedures (including critical care time) Labs Review Labs Reviewed - No data to display  Imaging Review No results found. I have personally reviewed and evaluated these images and lab results as part of my medical decision-making.   EKG  Interpretation None      MDM   Final diagnoses:  DDD (degenerative disc disease), cervical    Patient was advised to follow-up with Dr. Nelva Bush per his instructions (recieved a call back from his office while he was being discharged), appt scheduled.  Pt prescribed naproxen, oxycodone and flexeril.  Advised heat tx.    No neuro deficit on exam or by history to suggest emergent or surgical presentation. Pt was seen by Dr. Dayna Barker prior to dc home.        Evalee Jefferson, PA-C 08/08/15 1417  Merrily Pew, MD 08/08/15 1607  Merrily Pew, MD 08/08/15 [phone]

## 2015-08-10 DIAGNOSIS — M50322 Other cervical disc degeneration at C5-C6 level: Secondary | ICD-10-CM | POA: Diagnosis not present

## 2015-08-21 DIAGNOSIS — M545 Low back pain: Secondary | ICD-10-CM | POA: Diagnosis not present

## 2015-09-07 DIAGNOSIS — M47812 Spondylosis without myelopathy or radiculopathy, cervical region: Secondary | ICD-10-CM | POA: Diagnosis not present

## 2015-09-07 DIAGNOSIS — M1288 Other specific arthropathies, not elsewhere classified, other specified site: Secondary | ICD-10-CM | POA: Diagnosis not present

## 2015-09-24 DIAGNOSIS — F329 Major depressive disorder, single episode, unspecified: Secondary | ICD-10-CM | POA: Diagnosis not present

## 2015-09-24 DIAGNOSIS — Z1389 Encounter for screening for other disorder: Secondary | ICD-10-CM | POA: Diagnosis not present

## 2015-09-24 DIAGNOSIS — E1165 Type 2 diabetes mellitus with hyperglycemia: Secondary | ICD-10-CM | POA: Diagnosis not present

## 2015-09-24 DIAGNOSIS — M542 Cervicalgia: Secondary | ICD-10-CM | POA: Diagnosis not present

## 2015-09-24 DIAGNOSIS — Z6831 Body mass index (BMI) 31.0-31.9, adult: Secondary | ICD-10-CM | POA: Diagnosis not present

## 2015-10-25 DIAGNOSIS — M542 Cervicalgia: Secondary | ICD-10-CM | POA: Diagnosis not present

## 2015-10-25 DIAGNOSIS — E1165 Type 2 diabetes mellitus with hyperglycemia: Secondary | ICD-10-CM | POA: Diagnosis not present

## 2015-10-25 DIAGNOSIS — E785 Hyperlipidemia, unspecified: Secondary | ICD-10-CM | POA: Diagnosis not present

## 2015-10-25 DIAGNOSIS — I10 Essential (primary) hypertension: Secondary | ICD-10-CM | POA: Diagnosis not present

## 2015-11-09 DIAGNOSIS — G8929 Other chronic pain: Secondary | ICD-10-CM | POA: Diagnosis not present

## 2015-11-09 DIAGNOSIS — M5030 Other cervical disc degeneration, unspecified cervical region: Secondary | ICD-10-CM | POA: Diagnosis not present

## 2015-11-09 DIAGNOSIS — M542 Cervicalgia: Secondary | ICD-10-CM | POA: Diagnosis not present

## 2015-11-09 DIAGNOSIS — G894 Chronic pain syndrome: Secondary | ICD-10-CM | POA: Diagnosis not present

## 2015-11-09 DIAGNOSIS — Z79891 Long term (current) use of opiate analgesic: Secondary | ICD-10-CM | POA: Diagnosis not present

## 2015-11-12 DIAGNOSIS — Z87891 Personal history of nicotine dependence: Secondary | ICD-10-CM | POA: Diagnosis not present

## 2015-11-12 DIAGNOSIS — M542 Cervicalgia: Secondary | ICD-10-CM | POA: Diagnosis not present

## 2015-11-12 DIAGNOSIS — R51 Headache: Secondary | ICD-10-CM | POA: Diagnosis not present

## 2015-12-05 DIAGNOSIS — F329 Major depressive disorder, single episode, unspecified: Secondary | ICD-10-CM | POA: Diagnosis not present

## 2015-12-05 DIAGNOSIS — E785 Hyperlipidemia, unspecified: Secondary | ICD-10-CM | POA: Diagnosis not present

## 2015-12-05 DIAGNOSIS — N183 Chronic kidney disease, stage 3 (moderate): Secondary | ICD-10-CM | POA: Diagnosis not present

## 2015-12-05 DIAGNOSIS — E1122 Type 2 diabetes mellitus with diabetic chronic kidney disease: Secondary | ICD-10-CM | POA: Diagnosis not present

## 2016-01-04 DIAGNOSIS — E1122 Type 2 diabetes mellitus with diabetic chronic kidney disease: Secondary | ICD-10-CM | POA: Diagnosis not present

## 2016-01-04 DIAGNOSIS — Z87891 Personal history of nicotine dependence: Secondary | ICD-10-CM | POA: Diagnosis not present

## 2016-01-04 DIAGNOSIS — N183 Chronic kidney disease, stage 3 (moderate): Secondary | ICD-10-CM | POA: Diagnosis not present

## 2016-01-04 DIAGNOSIS — I10 Essential (primary) hypertension: Secondary | ICD-10-CM | POA: Diagnosis not present

## 2016-01-04 DIAGNOSIS — E785 Hyperlipidemia, unspecified: Secondary | ICD-10-CM | POA: Diagnosis not present

## 2016-01-04 DIAGNOSIS — F329 Major depressive disorder, single episode, unspecified: Secondary | ICD-10-CM | POA: Diagnosis not present

## 2016-01-17 DIAGNOSIS — F17200 Nicotine dependence, unspecified, uncomplicated: Secondary | ICD-10-CM | POA: Diagnosis not present

## 2016-01-17 DIAGNOSIS — E119 Type 2 diabetes mellitus without complications: Secondary | ICD-10-CM | POA: Diagnosis not present

## 2016-01-17 DIAGNOSIS — T63301A Toxic effect of unspecified spider venom, accidental (unintentional), initial encounter: Secondary | ICD-10-CM | POA: Diagnosis not present

## 2016-01-17 DIAGNOSIS — Z79899 Other long term (current) drug therapy: Secondary | ICD-10-CM | POA: Diagnosis not present

## 2016-01-17 DIAGNOSIS — Z7984 Long term (current) use of oral hypoglycemic drugs: Secondary | ICD-10-CM | POA: Diagnosis not present

## 2016-01-17 DIAGNOSIS — Z7982 Long term (current) use of aspirin: Secondary | ICD-10-CM | POA: Diagnosis not present

## 2016-01-17 DIAGNOSIS — I10 Essential (primary) hypertension: Secondary | ICD-10-CM | POA: Diagnosis not present

## 2016-02-04 DIAGNOSIS — E1129 Type 2 diabetes mellitus with other diabetic kidney complication: Secondary | ICD-10-CM | POA: Diagnosis not present

## 2016-02-04 DIAGNOSIS — R809 Proteinuria, unspecified: Secondary | ICD-10-CM | POA: Diagnosis not present

## 2016-02-04 DIAGNOSIS — Z299 Encounter for prophylactic measures, unspecified: Secondary | ICD-10-CM | POA: Diagnosis not present

## 2016-02-04 DIAGNOSIS — R350 Frequency of micturition: Secondary | ICD-10-CM | POA: Diagnosis not present

## 2016-02-04 DIAGNOSIS — I10 Essential (primary) hypertension: Secondary | ICD-10-CM | POA: Diagnosis not present

## 2016-02-05 DIAGNOSIS — M66821 Spontaneous rupture of other tendons, right upper arm: Secondary | ICD-10-CM | POA: Diagnosis not present

## 2016-02-05 DIAGNOSIS — Z79899 Other long term (current) drug therapy: Secondary | ICD-10-CM | POA: Diagnosis not present

## 2016-02-05 DIAGNOSIS — Z7984 Long term (current) use of oral hypoglycemic drugs: Secondary | ICD-10-CM | POA: Diagnosis not present

## 2016-02-05 DIAGNOSIS — F17200 Nicotine dependence, unspecified, uncomplicated: Secondary | ICD-10-CM | POA: Diagnosis not present

## 2016-02-05 DIAGNOSIS — E119 Type 2 diabetes mellitus without complications: Secondary | ICD-10-CM | POA: Diagnosis not present

## 2016-02-05 DIAGNOSIS — S46211A Strain of muscle, fascia and tendon of other parts of biceps, right arm, initial encounter: Secondary | ICD-10-CM | POA: Diagnosis not present

## 2016-02-05 DIAGNOSIS — Z7982 Long term (current) use of aspirin: Secondary | ICD-10-CM | POA: Diagnosis not present

## 2016-02-05 DIAGNOSIS — I10 Essential (primary) hypertension: Secondary | ICD-10-CM | POA: Diagnosis not present

## 2016-02-05 DIAGNOSIS — M79601 Pain in right arm: Secondary | ICD-10-CM | POA: Diagnosis not present

## 2016-02-06 DIAGNOSIS — E1129 Type 2 diabetes mellitus with other diabetic kidney complication: Secondary | ICD-10-CM | POA: Diagnosis not present

## 2016-02-08 DIAGNOSIS — S46119D Strain of muscle, fascia and tendon of long head of biceps, unspecified arm, subsequent encounter: Secondary | ICD-10-CM | POA: Diagnosis not present

## 2016-02-08 DIAGNOSIS — M75100 Unspecified rotator cuff tear or rupture of unspecified shoulder, not specified as traumatic: Secondary | ICD-10-CM | POA: Diagnosis not present

## 2016-02-11 DIAGNOSIS — R809 Proteinuria, unspecified: Secondary | ICD-10-CM | POA: Diagnosis not present

## 2016-02-11 DIAGNOSIS — N200 Calculus of kidney: Secondary | ICD-10-CM | POA: Diagnosis not present

## 2016-02-11 DIAGNOSIS — R800 Isolated proteinuria: Secondary | ICD-10-CM | POA: Diagnosis not present

## 2016-02-14 DIAGNOSIS — M25511 Pain in right shoulder: Secondary | ICD-10-CM | POA: Diagnosis not present

## 2016-02-14 DIAGNOSIS — M7551 Bursitis of right shoulder: Secondary | ICD-10-CM | POA: Diagnosis not present

## 2016-02-14 DIAGNOSIS — M7591 Shoulder lesion, unspecified, right shoulder: Secondary | ICD-10-CM | POA: Diagnosis not present

## 2016-02-14 DIAGNOSIS — S46111A Strain of muscle, fascia and tendon of long head of biceps, right arm, initial encounter: Secondary | ICD-10-CM | POA: Diagnosis not present

## 2016-02-15 DIAGNOSIS — S46119D Strain of muscle, fascia and tendon of long head of biceps, unspecified arm, subsequent encounter: Secondary | ICD-10-CM | POA: Diagnosis not present

## 2016-02-15 DIAGNOSIS — M75100 Unspecified rotator cuff tear or rupture of unspecified shoulder, not specified as traumatic: Secondary | ICD-10-CM | POA: Diagnosis not present

## 2016-02-18 DIAGNOSIS — M25511 Pain in right shoulder: Secondary | ICD-10-CM | POA: Diagnosis not present

## 2016-02-18 DIAGNOSIS — M25521 Pain in right elbow: Secondary | ICD-10-CM | POA: Diagnosis not present

## 2016-02-18 DIAGNOSIS — M6281 Muscle weakness (generalized): Secondary | ICD-10-CM | POA: Diagnosis not present

## 2016-02-22 DIAGNOSIS — M25511 Pain in right shoulder: Secondary | ICD-10-CM | POA: Diagnosis not present

## 2016-02-22 DIAGNOSIS — M6281 Muscle weakness (generalized): Secondary | ICD-10-CM | POA: Diagnosis not present

## 2016-02-22 DIAGNOSIS — M25521 Pain in right elbow: Secondary | ICD-10-CM | POA: Diagnosis not present

## 2016-02-25 DIAGNOSIS — M6281 Muscle weakness (generalized): Secondary | ICD-10-CM | POA: Diagnosis not present

## 2016-02-25 DIAGNOSIS — M25521 Pain in right elbow: Secondary | ICD-10-CM | POA: Diagnosis not present

## 2016-02-25 DIAGNOSIS — M25511 Pain in right shoulder: Secondary | ICD-10-CM | POA: Diagnosis not present

## 2016-03-03 DIAGNOSIS — M25521 Pain in right elbow: Secondary | ICD-10-CM | POA: Diagnosis not present

## 2016-03-03 DIAGNOSIS — M6281 Muscle weakness (generalized): Secondary | ICD-10-CM | POA: Diagnosis not present

## 2016-03-03 DIAGNOSIS — M25511 Pain in right shoulder: Secondary | ICD-10-CM | POA: Diagnosis not present

## 2016-03-07 DIAGNOSIS — Z1211 Encounter for screening for malignant neoplasm of colon: Secondary | ICD-10-CM | POA: Diagnosis not present

## 2016-03-07 DIAGNOSIS — Z299 Encounter for prophylactic measures, unspecified: Secondary | ICD-10-CM | POA: Diagnosis not present

## 2016-03-07 DIAGNOSIS — Z0000 Encounter for general adult medical examination without abnormal findings: Secondary | ICD-10-CM | POA: Diagnosis not present

## 2016-03-07 DIAGNOSIS — Z1389 Encounter for screening for other disorder: Secondary | ICD-10-CM | POA: Diagnosis not present

## 2016-03-10 DIAGNOSIS — R5383 Other fatigue: Secondary | ICD-10-CM | POA: Diagnosis not present

## 2016-03-10 DIAGNOSIS — E7800 Pure hypercholesterolemia, unspecified: Secondary | ICD-10-CM | POA: Diagnosis not present

## 2016-03-10 DIAGNOSIS — E1122 Type 2 diabetes mellitus with diabetic chronic kidney disease: Secondary | ICD-10-CM | POA: Diagnosis not present

## 2016-03-10 DIAGNOSIS — Z125 Encounter for screening for malignant neoplasm of prostate: Secondary | ICD-10-CM | POA: Diagnosis not present

## 2016-03-14 DIAGNOSIS — S46119D Strain of muscle, fascia and tendon of long head of biceps, unspecified arm, subsequent encounter: Secondary | ICD-10-CM | POA: Diagnosis not present

## 2016-03-14 DIAGNOSIS — M75100 Unspecified rotator cuff tear or rupture of unspecified shoulder, not specified as traumatic: Secondary | ICD-10-CM | POA: Diagnosis not present

## 2016-03-25 DIAGNOSIS — Z720 Tobacco use: Secondary | ICD-10-CM | POA: Diagnosis not present

## 2016-03-25 DIAGNOSIS — N183 Chronic kidney disease, stage 3 (moderate): Secondary | ICD-10-CM | POA: Diagnosis not present

## 2016-03-25 DIAGNOSIS — N200 Calculus of kidney: Secondary | ICD-10-CM | POA: Diagnosis not present

## 2016-03-25 DIAGNOSIS — R809 Proteinuria, unspecified: Secondary | ICD-10-CM | POA: Diagnosis not present

## 2016-03-25 DIAGNOSIS — I10 Essential (primary) hypertension: Secondary | ICD-10-CM | POA: Diagnosis not present

## 2016-03-31 DIAGNOSIS — Z87442 Personal history of urinary calculi: Secondary | ICD-10-CM | POA: Diagnosis not present

## 2016-03-31 DIAGNOSIS — R079 Chest pain, unspecified: Secondary | ICD-10-CM | POA: Diagnosis not present

## 2016-03-31 DIAGNOSIS — I10 Essential (primary) hypertension: Secondary | ICD-10-CM | POA: Diagnosis not present

## 2016-03-31 DIAGNOSIS — Z87891 Personal history of nicotine dependence: Secondary | ICD-10-CM | POA: Diagnosis not present

## 2016-03-31 DIAGNOSIS — R072 Precordial pain: Secondary | ICD-10-CM | POA: Diagnosis not present

## 2016-03-31 DIAGNOSIS — R0602 Shortness of breath: Secondary | ICD-10-CM | POA: Diagnosis not present

## 2016-03-31 DIAGNOSIS — E119 Type 2 diabetes mellitus without complications: Secondary | ICD-10-CM | POA: Diagnosis not present

## 2016-04-01 DIAGNOSIS — I10 Essential (primary) hypertension: Secondary | ICD-10-CM | POA: Diagnosis not present

## 2016-04-01 DIAGNOSIS — N200 Calculus of kidney: Secondary | ICD-10-CM | POA: Diagnosis not present

## 2016-04-01 DIAGNOSIS — R079 Chest pain, unspecified: Secondary | ICD-10-CM | POA: Diagnosis not present

## 2016-04-04 DIAGNOSIS — M545 Low back pain: Secondary | ICD-10-CM | POA: Diagnosis not present

## 2016-04-04 DIAGNOSIS — I10 Essential (primary) hypertension: Secondary | ICD-10-CM | POA: Diagnosis not present

## 2016-04-04 DIAGNOSIS — N183 Chronic kidney disease, stage 3 (moderate): Secondary | ICD-10-CM | POA: Diagnosis not present

## 2016-04-04 DIAGNOSIS — Z6829 Body mass index (BMI) 29.0-29.9, adult: Secondary | ICD-10-CM | POA: Diagnosis not present

## 2016-04-04 DIAGNOSIS — E1122 Type 2 diabetes mellitus with diabetic chronic kidney disease: Secondary | ICD-10-CM | POA: Diagnosis not present

## 2016-04-07 DIAGNOSIS — R931 Abnormal findings on diagnostic imaging of heart and coronary circulation: Secondary | ICD-10-CM | POA: Diagnosis not present

## 2016-04-07 DIAGNOSIS — I5189 Other ill-defined heart diseases: Secondary | ICD-10-CM | POA: Diagnosis not present

## 2016-04-07 DIAGNOSIS — R079 Chest pain, unspecified: Secondary | ICD-10-CM | POA: Diagnosis not present

## 2016-04-09 ENCOUNTER — Encounter (HOSPITAL_COMMUNITY): Payer: Medicare Other | Attending: Hematology | Admitting: Hematology

## 2016-04-09 ENCOUNTER — Encounter (HOSPITAL_COMMUNITY): Payer: Medicare Other

## 2016-04-09 ENCOUNTER — Encounter (HOSPITAL_COMMUNITY): Payer: Self-pay | Admitting: Hematology

## 2016-04-09 ENCOUNTER — Other Ambulatory Visit (HOSPITAL_COMMUNITY): Payer: Self-pay | Admitting: *Deleted

## 2016-04-09 VITALS — BP 168/71 | HR 62 | Temp 98.0°F | Resp 18 | Ht 70.0 in | Wt 197.5 lb

## 2016-04-09 DIAGNOSIS — R161 Splenomegaly, not elsewhere classified: Secondary | ICD-10-CM | POA: Diagnosis not present

## 2016-04-09 DIAGNOSIS — Z7982 Long term (current) use of aspirin: Secondary | ICD-10-CM | POA: Diagnosis not present

## 2016-04-09 DIAGNOSIS — N189 Chronic kidney disease, unspecified: Secondary | ICD-10-CM | POA: Diagnosis not present

## 2016-04-09 DIAGNOSIS — E1122 Type 2 diabetes mellitus with diabetic chronic kidney disease: Secondary | ICD-10-CM | POA: Insufficient documentation

## 2016-04-09 DIAGNOSIS — D72824 Basophilia: Secondary | ICD-10-CM | POA: Diagnosis not present

## 2016-04-09 DIAGNOSIS — D696 Thrombocytopenia, unspecified: Secondary | ICD-10-CM | POA: Insufficient documentation

## 2016-04-09 DIAGNOSIS — D72829 Elevated white blood cell count, unspecified: Secondary | ICD-10-CM

## 2016-04-09 DIAGNOSIS — R0789 Other chest pain: Secondary | ICD-10-CM | POA: Insufficient documentation

## 2016-04-09 DIAGNOSIS — F419 Anxiety disorder, unspecified: Secondary | ICD-10-CM | POA: Diagnosis not present

## 2016-04-09 DIAGNOSIS — Z87891 Personal history of nicotine dependence: Secondary | ICD-10-CM | POA: Insufficient documentation

## 2016-04-09 DIAGNOSIS — I129 Hypertensive chronic kidney disease with stage 1 through stage 4 chronic kidney disease, or unspecified chronic kidney disease: Secondary | ICD-10-CM | POA: Diagnosis not present

## 2016-04-09 DIAGNOSIS — E785 Hyperlipidemia, unspecified: Secondary | ICD-10-CM | POA: Insufficient documentation

## 2016-04-09 DIAGNOSIS — K219 Gastro-esophageal reflux disease without esophagitis: Secondary | ICD-10-CM | POA: Insufficient documentation

## 2016-04-09 DIAGNOSIS — Z7984 Long term (current) use of oral hypoglycemic drugs: Secondary | ICD-10-CM | POA: Diagnosis not present

## 2016-04-09 LAB — CBC WITH DIFFERENTIAL/PLATELET
BLASTS: 0 %
Band Neutrophils: 30 %
Basophils Absolute: 1.3 10*3/uL — ABNORMAL HIGH (ref 0.0–0.1)
Basophils Relative: 11 %
EOS PCT: 3 %
Eosinophils Absolute: 0.4 10*3/uL (ref 0.0–0.7)
HCT: 40.6 % (ref 39.0–52.0)
Hemoglobin: 13.5 g/dL (ref 13.0–17.0)
LYMPHS ABS: 3.3 10*3/uL (ref 0.7–4.0)
LYMPHS PCT: 27 %
MCH: 29.8 pg (ref 26.0–34.0)
MCHC: 33.3 g/dL (ref 30.0–36.0)
MCV: 89.6 fL (ref 78.0–100.0)
MONO ABS: 0.7 10*3/uL (ref 0.1–1.0)
MONOS PCT: 6 %
Metamyelocytes Relative: 2 %
Myelocytes: 0 %
NEUTROS ABS: 6.4 10*3/uL (ref 1.7–7.7)
NRBC: 0 /100{WBCs}
Neutrophils Relative %: 21 %
PLATELETS: 430 10*3/uL — AB (ref 150–400)
Promyelocytes Absolute: 0 %
RBC: 4.53 MIL/uL (ref 4.22–5.81)
RDW: 21.6 % — AB (ref 11.5–15.5)
WBC: 12.1 10*3/uL — AB (ref 4.0–10.5)

## 2016-04-09 LAB — COMPREHENSIVE METABOLIC PANEL
ALBUMIN: 3.6 g/dL (ref 3.5–5.0)
ALT: 20 U/L (ref 17–63)
AST: 34 U/L (ref 15–41)
Alkaline Phosphatase: 114 U/L (ref 38–126)
Anion gap: 3 — ABNORMAL LOW (ref 5–15)
BUN: 23 mg/dL — AB (ref 6–20)
CHLORIDE: 104 mmol/L (ref 101–111)
CO2: 29 mmol/L (ref 22–32)
Calcium: 8.3 mg/dL — ABNORMAL LOW (ref 8.9–10.3)
Creatinine, Ser: 1.82 mg/dL — ABNORMAL HIGH (ref 0.61–1.24)
GFR calc Af Amer: 42 mL/min — ABNORMAL LOW (ref >60)
GFR, EST NON AFRICAN AMERICAN: 36 mL/min — AB (ref >60)
Glucose, Bld: 112 mg/dL — ABNORMAL HIGH (ref 65–99)
POTASSIUM: 4.7 mmol/L (ref 3.5–5.1)
SODIUM: 136 mmol/L (ref 135–145)
Total Bilirubin: 0.6 mg/dL (ref 0.3–1.2)
Total Protein: 6.7 g/dL (ref 6.5–8.1)

## 2016-04-09 LAB — LACTATE DEHYDROGENASE: LDH: 1121 U/L — AB (ref 98–192)

## 2016-04-09 LAB — SEDIMENTATION RATE: SED RATE: 38 mm/h — AB (ref 0–16)

## 2016-04-09 NOTE — Addendum Note (Signed)
Addended by: Gerhard Perches on: 04/09/2016 01:25 PM   Modules accepted: Orders

## 2016-04-09 NOTE — Patient Instructions (Addendum)
[place] at [facility]  Discharge Instructions:  Seen by MD Irene Limbo.  Labs today.  Follow up with MD Penland in 2 weeks.  _______________________________________________________________  Thank you for choosing [place] at [facility] to provide your oncology and hematology care.  To afford each patient quality time with our providers, please arrive at least 15 minutes before your scheduled appointment.  You need to re-schedule your appointment if you arrive 10 or more minutes late.  We strive to give you quality time with our providers, and arriving late affects you and other patients whose appointments are after yours.  Also, if you no show three or more times for appointments you may be dismissed from the clinic.  Again, thank you for choosing [place] at [facility] hope is that these requests will allow you access to exceptional care and in a timely manner. _______________________________________________________________  If you have questions after your visit, please contact our office at [phone] between the hours of 8:30 a.m. and 5:00 p.m. Voicemails left after 4:30 p.m. will not be returned until the following business day. _______________________________________________________________  For prescription refill requests, have your pharmacy contact our office. _______________________________________________________________  Recommendations made by the consultant and any test results will be sent to your referring physician. _______________________________________________________________

## 2016-04-09 NOTE — Progress Notes (Signed)
HEMATOLOGY/ONCOLOGY CONSULTATION NOTE  Date of Service: 04/09/2016  Patient Care Team: [name], MD as PCP - General (Internal Medicine) Danie Binder, MD as Consulting Physician (Gastroenterology)  CHIEF COMPLAINTS/PURPOSE OF CONSULTATION:  "Immature cells in the blood"  HISTORY OF PRESENTING ILLNESS:   [name] is a  70 y.o. 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  Patient has a history of hypertension, diabetes, dyslipidemia, chronic kidney disease, chronic back pain issues, BPH, GERD , thrombocytopenia since at least March 2015 his platelet counts were in the 40-50.k range, splenomegaly documented in 2010 who was referred to Dr. Jacquiline Doe from hematology in March 2013 for thrombocytopenia and immature cells in the blood. Patient reports that he never made the hematology appointment.  He reports that he recently was in the emergency room for chest pain that has been occurring persistently on and off for the last several weeks. He reports that he had a cardiac stress test recently which he was told was positive. He notes that his blood pressure was as high as 200/90 and he has recently been started on a beta blocker. Continues to be on baby aspirin daily. He notes that he will be seeing Dr. Harl Bowie from cardiology for further evaluation tomorrow.   Patient reports that he had routine labs done for his routine physical evaluation on 03/12/2016 which showed a hemoglobin of 13.8k, WBC count of 10.2k with 4.8k neutrophils 1.9k lymphocytes and some increased immature granulocytes and an absolute basophil count of 600. No blasts noted on outside manual differential. His platelet counts on these labs appear to be normal now at 231k. He notes no fevers no chills no night sweats no unexpected weight loss. Notes he has had some dull dragging abdominal pain in his upper abdomen. He reports  no significant alcohol use. No focal symptoms of infection. No known liver disease Smokes one to 2 cigars a day.  Notes that he been having intermittent pain due to his kidney stones that have been quite bothered. No overt hematuria. No dysuria. No flank pain or tenderness currently.  Patient and his wife notes that they have been under a lot of stress due to the fact that 2 of their brother-in-law's past in the last 2 months from heart disease and you're having some issues with her children.   MEDICAL HISTORY:  Past Medical History:  Diagnosis Date  . Anxiety   . Chronic back pain   . Diabetes mellitus   . Gastritis   . GERD (gastroesophageal reflux disease)   . Hypertension   . Renal insufficiency     SURGICAL HISTORY: Past Surgical History:  Procedure Laterality Date  . BACK SURGERY    . COLONOSCOPY WITH PROPOFOL N/A 02/01/2013   SLF: 1. 23 colon polyps removed. 2 retrieved. 2. Mild diverticulosis in teh sigmoid colon 3. Small internal hemorrhoids 4. The colon is redundant   . ESOPHAGOGASTRODUODENOSCOPY (EGD) WITH PROPOFOL N/A 02/01/2013   SLF: 1. Moderate erosive gastritis  . POLYPECTOMY N/A 02/01/2013   Procedure: POLYPECTOMY;  Surgeon: Danie Binder, MD;  Location: [facility];  Service: Endoscopy;  Laterality: N/A;    SOCIAL HISTORY: Social History   Social History  . Marital status: Divorced    Spouse name: N/A  . Number of children: N/A  . Years of education: N/A   Occupational History  . retired     Architect  Social History Main Topics  . Smoking status: Former Smoker    Packs/day: 1.00    Years: 20.00    Types: Cigars    Quit date: 08/04/2013  . Smokeless tobacco: Never Used  . 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 Drug use: No  . Sexual activity: Yes    Birth control/ protection: None   Other Topics Concern  . Not on file   Social History Narrative  . No narrative on file    FAMILY  HISTORY: Family History  Problem Relation Age of Onset  . Colon cancer Neg Hx     ALLERGIES:  is allergic to hydrocodone.  MEDICATIONS:  Current Outpatient Prescriptions  Medication Sig Dispense Refill  . acetaminophen (TYLENOL) 500 MG tablet Take 500 mg by mouth every 6 (six) hours as needed for mild pain or moderate pain.    . clonazePAM (KLONOPIN) 0.5 MG tablet Take 0.5 mg by mouth 4 (four) times daily.    . cyclobenzaprine (FLEXERIL) 5 MG tablet Take 1 tablet (5 mg total) by mouth 3 (three) times daily as needed for muscle spasms. 20 tablet 0  . diazepam (VALIUM) 10 MG tablet Take 1 tablet (10 mg total) by mouth 2 (two) times daily. (Patient not taking: Reported on 08/08/2015) 20 tablet 0  . DULoxetine (CYMBALTA) 60 MG capsule Take 1 capsule by mouth daily.     Marland Kitchen HYDROmorphone (DILAUDID) 4 MG tablet Take 1 tablet (4 mg total) by mouth every 4 (four) hours as needed for severe pain. (Patient not taking: Reported on 08/08/2015) 20 tablet 0  . lisinopril (PRINIVIL,ZESTRIL) 20 MG tablet Take 1 tablet by mouth daily.    . metFORMIN (GLUCOPHAGE) 500 MG tablet Take 1,000 mg by mouth 2 (two) times daily with a meal.     . naproxen (NAPROSYN) 500 MG tablet Take 1 tablet (500 mg total) by mouth 2 (two) times daily. 30 tablet 0  . omeprazole (PRILOSEC) 20 MG capsule Take 20 mg by mouth daily.     Marland Kitchen oxyCODONE-acetaminophen (PERCOCET/ROXICET) 5-325 MG tablet Take 1 tablet by mouth every 4 (four) hours as needed. 20 tablet 0  . traMADol (ULTRAM) 50 MG tablet Take 50 mg by mouth 3 (three) times daily.     No current facility-administered medications for this visit.     REVIEW OF SYSTEMS:    10 Point review of Systems was done is negative except as noted above.  PHYSICAL EXAMINATION: ECOG PERFORMANCE STATUS: 1 - Symptomatic but completely ambulatory  . Vitals:   04/09/16 0951  BP: (!) 168/71  Pulse: 62  Resp: 18  Temp: 98 F (36.7 C)   Filed Weights   04/09/16 0951  Weight: 197 lb 8 oz  (89.6 kg)   .Body mass index is 28.34 kg/m.  GENERAL:alert, in no acute distress and comfortable SKIN: skin color, texture, turgor are normal, no rashes or significant lesions EYES: normal, conjunctiva are pink and non-injected, sclera clear OROPHARYNX:no exudate, no erythema and lips, buccal mucosa, and tongue normal  NECK: supple, no JVD, thyroid normal size, non-tender, without nodularity LYMPH:  no palpable lymphadenopathy in the cervical, axillary or inguinal LUNGS: clear to auscultation with normal respiratory effort HEART: regular rate & rhythm,  no murmurs and no lower extremity edema ABDOMEN: abdomen soft, non-tender, normoactive bowel sounds , No palpable hepatomegaly, palpable splenomegaly for 4-5 cm under the costal margin in the midclavicular line Musculoskeletal:  no cyanosis of digits and no clubbing  PSYCH: alert & oriented x 3 with fluent speech NEURO: no focal motor/sensory deficits  LABORATORY DATA:  I have reviewed the data as listed  . CBC Latest Ref Rng & Units 09/25/2013 09/24/2013 01/25/2013  WBC 4.0 - 10.5 K/uL 5.8 4.8 -  Hemoglobin 13.0 - 17.0 g/dL 12.3(L) 11.7(L) 12.6(L)  Hematocrit 39.0 - 52.0 % 35.5(L) 32.9(L) 34.6(L)  Platelets 150 - 400 K/uL 45(L) 41(L) -    . CMP Latest Ref Rng & Units 09/25/2013 09/24/2013 01/25/2013  Glucose 70 - 99 mg/dL 254(H) 249(H) 174(H)  BUN 6 - 23 mg/dL 37(H) 29(H) 11  Creatinine 0.50 - 1.35 mg/dL 1.58(H) 1.43(H) 1.37(H)  Sodium 137 - 147 mEq/L 135(L) 136(L) 141  Potassium 3.7 - 5.3 mEq/L 5.2 5.1 4.9  Chloride 96 - 112 mEq/L 95(L) 98 105  CO2 19 - 32 mEq/L 27 27 32  Calcium 8.4 - 10.5 mg/dL 9.5 9.3 9.4  Total Protein g/dL - - -  Total Bilirubin mg/dL - - -  Alkaline Phos U/L - - -  AST U/L - - -  ALT 10 - 40 U/L - - -     RADIOGRAPHIC STUDIES: I have personally reviewed the radiological images as listed and agreed with the findings in the report. No results found.  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 Rule out myeloproliferative syndrome. #2 history of thrombocytopenia platelet counts were in the 40-50k in 2015. Recent platelet counts within normal limits. #3 history of splenomegaly since 2010 - unclear etiology. Noted to have teardrops in his peripheral blood rule out mild proliferative syndrome especially myelofibrosis. #4 new anginal chest pains with positive stress test concerning for coronary artery disease. - Being evaluated by Dr. Harl Bowie from cardiology tomorrow. Plan -A repeat CBC with differential including manual differential today, LDH, CMP and peripheral blood smear - Will Jak2 and BCR-ABL test -flow cytometry on peripheral blood -Ultrasound of the abdomen to evaluate for liver pathology and to evaluate splenic size and for other focal splenic pathology. -Needs urgent cardiology evaluation due to her concern for coronary artery disease - is going to be seeing Dr. Harl Bowie tomorrow. We are hoping that his platelet counts on labs today are stable and does not have abating on the decision for  cardiac intervention. -All the findings and plan of care were discussed in detail with the patient.  Return to clinic with Dr Whitney Muse in 2 weeks with above labs results and [place] abd  All of the patients questions were answered with apparent satisfaction. The patient knows to call the clinic with any problems, questions or concerns.  I spent 50 minutes counseling the patient face to face. The total time spent in the appointment was 60 minutes and more than 50% was on counseling and direct patient cares.    Sullivan Lone MD [place] AAHIVMS [facility] Hematology/Oncology Physician [facility]  (Office):       [phone] (Work cell):  [phone] (Fax):           [phone]  04/09/2016 9:55 AM

## 2016-04-09 NOTE — Progress Notes (Deleted)
Marland Kitchen    HEMATOLOGY/ONCOLOGY CONSULTATION NOTE  Date of Service: 04/09/2016  Patient Care Team: [name], MD as PCP - General (Internal Medicine) Danie Binder, MD as Consulting Physician (Gastroenterology)  CHIEF COMPLAINTS/PURPOSE OF CONSULTATION:  ***  HISTORY OF PRESENTING ILLNESS:  [name] is a wonderful 70 y.o. male who has been referred to [place] by Dr Merryl Hacker for evaluation and management of ***  MEDICAL HISTORY:  Past Medical History:  Diagnosis Date  . Anxiety   . Chronic back pain   . Diabetes mellitus   . Gastritis   . GERD (gastroesophageal reflux disease)   . Hypertension   . Renal insufficiency     SURGICAL HISTORY: Past Surgical History:  Procedure Laterality Date  . BACK SURGERY    . COLONOSCOPY WITH PROPOFOL N/A 02/01/2013   SLF: 1. 23 colon polyps removed. 2 retrieved. 2. Mild diverticulosis in teh sigmoid colon 3. Small internal hemorrhoids 4. The colon is redundant   . ESOPHAGOGASTRODUODENOSCOPY (EGD) WITH PROPOFOL N/A 02/01/2013   SLF: 1. Moderate erosive gastritis  . POLYPECTOMY N/A 02/01/2013   Procedure: POLYPECTOMY;  Surgeon: Danie Binder, MD;  Location: [facility];  Service: Endoscopy;  Laterality: N/A;    SOCIAL HISTORY: Social History   Social History  . Marital status: Divorced    Spouse name: N/A  . Number of children: N/A  . Years of education: N/A   Occupational History  . retired     Architect   Social History Main Topics  . Smoking status: Former Smoker    Packs/day: 1.00    Years: 20.00    Types: Cigars    Quit date: 08/04/2013  . Smokeless tobacco: Never Used  . 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 Drug use: No  . Sexual activity: Yes    Birth control/ protection: None   Other Topics Concern  . Not on file   Social History Narrative  . No narrative on file    FAMILY HISTORY: Family History  Problem Relation Age of Onset  . Colon cancer Neg Hx      ALLERGIES:  is allergic to hydrocodone.  MEDICATIONS:  Current Outpatient Prescriptions  Medication Sig Dispense Refill  . acetaminophen (TYLENOL) 500 MG tablet Take 500 mg by mouth every 6 (six) hours as needed for mild pain or moderate pain.    . clonazePAM (KLONOPIN) 0.5 MG tablet Take 0.5 mg by mouth 4 (four) times daily.    . cyclobenzaprine (FLEXERIL) 5 MG tablet Take 1 tablet (5 mg total) by mouth 3 (three) times daily as needed for muscle spasms. 20 tablet 0  . diazepam (VALIUM) 10 MG tablet Take 1 tablet (10 mg total) by mouth 2 (two) times daily. (Patient not taking: Reported on 08/08/2015) 20 tablet 0  . DULoxetine (CYMBALTA) 60 MG capsule Take 1 capsule by mouth daily.     Marland Kitchen HYDROmorphone (DILAUDID) 4 MG tablet Take 1 tablet (4 mg total) by mouth every 4 (four) hours as needed for severe pain. (Patient not taking: Reported on 08/08/2015) 20 tablet 0  . lisinopril (PRINIVIL,ZESTRIL) 20 MG tablet Take 1 tablet by mouth daily.    . metFORMIN (GLUCOPHAGE) 500 MG tablet Take 1,000 mg by mouth 2 (two) times daily with a meal.     . naproxen (NAPROSYN) 500 MG tablet Take 1 tablet (500 mg total) by mouth 2 (two) times daily. 30 tablet 0  .  omeprazole (PRILOSEC) 20 MG capsule Take 20 mg by mouth daily.     Marland Kitchen oxyCODONE-acetaminophen (PERCOCET/ROXICET) 5-325 MG tablet Take 1 tablet by mouth every 4 (four) hours as needed. 20 tablet 0  . traMADol (ULTRAM) 50 MG tablet Take 50 mg by mouth 3 (three) times daily.     No current facility-administered medications for this visit.     REVIEW OF SYSTEMS:    10 Point review of Systems was done is negative except as noted above.  PHYSICAL EXAMINATION: ECOG PERFORMANCE STATUS: {CHL ONC ECOG JO:[id number]}  . Vitals:   04/09/16 0951  BP: (!) 168/71  Pulse: 62  Resp: 18  Temp: 98 F (36.7 C)   Filed Weights   04/09/16 0951  Weight: 197 lb 8 oz (89.6 kg)   .Body mass index is 28.34 kg/m.  GENERAL:alert, in no acute distress and  comfortable SKIN: skin color, texture, turgor are normal, no rashes or significant lesions EYES: normal, conjunctiva are pink and non-injected, sclera clear OROPHARYNX:no exudate, no erythema and lips, buccal mucosa, and tongue normal  NECK: supple, no JVD, thyroid normal size, non-tender, without nodularity LYMPH:  no palpable lymphadenopathy in the cervical, axillary or inguinal LUNGS: clear to auscultation with normal respiratory effort HEART: regular rate & rhythm,  no murmurs and no lower extremity edema ABDOMEN: abdomen soft, non-tender, normoactive bowel sounds  Musculoskeletal: no cyanosis of digits and no clubbing  PSYCH: alert & oriented x 3 with fluent speech NEURO: no focal motor/sensory deficits  LABORATORY DATA:  I have reviewed the data as listed  . CBC Latest Ref Rng & Units 09/25/2013 09/24/2013 01/25/2013  WBC 4.0 - 10.5 K/uL 5.8 4.8 -  Hemoglobin 13.0 - 17.0 g/dL 12.3(L) 11.7(L) 12.6(L)  Hematocrit 39.0 - 52.0 % 35.5(L) 32.9(L) 34.6(L)  Platelets 150 - 400 K/uL 45(L) 41(L) -    . CMP Latest Ref Rng & Units 09/25/2013 09/24/2013 01/25/2013  Glucose 70 - 99 mg/dL 254(H) 249(H) 174(H)  BUN 6 - 23 mg/dL 37(H) 29(H) 11  Creatinine 0.50 - 1.35 mg/dL 1.58(H) 1.43(H) 1.37(H)  Sodium 137 - 147 mEq/L 135(L) 136(L) 141  Potassium 3.7 - 5.3 mEq/L 5.2 5.1 4.9  Chloride 96 - 112 mEq/L 95(L) 98 105  CO2 19 - 32 mEq/L 27 27 32  Calcium 8.4 - 10.5 mg/dL 9.5 9.3 9.4  Total Protein g/dL - - -  Total Bilirubin mg/dL - - -  Alkaline Phos U/L - - -  AST U/L - - -  ALT 10 - 40 U/L - - -     RADIOGRAPHIC STUDIES: I have personally reviewed the radiological images as listed and agreed with the findings in the report. No results found.  ASSESSMENT & PLAN:  ***  All of the patients questions were answered with apparent satisfaction. The patient knows to call the clinic with any problems, questions or concerns.  I spent {CHL ONC TIME VISIT - OBSJG:[id number]} counseling the patient face  to face. The total time spent in the appointment was {CHL ONC TIME VISIT - LYYTK:[id number]} and more than 50% was on counseling and direct patient cares.    Sullivan Lone MD [place] AAHIVMS [facility] Hematology/Oncology Physician [facility]  (Office):       [phone] (Work cell):  [phone] (Fax):           [phone]  04/09/2016 9:55 AM

## 2016-04-10 ENCOUNTER — Ambulatory Visit (INDEPENDENT_AMBULATORY_CARE_PROVIDER_SITE_OTHER): Payer: Medicare Other | Admitting: Cardiology

## 2016-04-10 ENCOUNTER — Encounter: Payer: Self-pay | Admitting: Cardiology

## 2016-04-10 VITALS — BP 144/64 | HR 69 | Ht 72.0 in | Wt 197.0 lb

## 2016-04-10 DIAGNOSIS — R9439 Abnormal result of other cardiovascular function study: Secondary | ICD-10-CM

## 2016-04-10 DIAGNOSIS — N183 Chronic kidney disease, stage 3 unspecified: Secondary | ICD-10-CM

## 2016-04-10 DIAGNOSIS — R0789 Other chest pain: Secondary | ICD-10-CM | POA: Diagnosis not present

## 2016-04-10 LAB — KAPPA/LAMBDA LIGHT CHAINS
Kappa free light chain: 29.7 mg/L — ABNORMAL HIGH (ref 3.3–19.4)
Kappa, lambda light chain ratio: 1.21 (ref 0.26–1.65)
LAMDA FREE LIGHT CHAINS: 24.5 mg/L (ref 5.7–26.3)

## 2016-04-10 NOTE — Progress Notes (Signed)
Clinical Summary [name] is a 70 y.o.male seen as a new patient, he is referred for chest pain and abnormal stress test  1. Abnormal stress test - recent chest pain 3-4 week ago. Pressure like feeling midchest, 7/10 in severity. Isolated episode while sitting. +SOB. Had some diaphoresis. Lasted about 45 minutes. Not positional. Martin Majestic to [facility], refused admissions - since that time mild "twinges" I nchest. No SOB or DOE, though fairly sedentary lifestyle.  - nuclear stress test at [facility] showed large area of inferolateral ischemia.   CAD risk factors: HTN, DM2, +tobacco cigars several years. Father CVA late 76s. Mother with CAD late 79s.    2. CKD - upcoming appt with nephrology Dr Hinda Lenis.    3. Thrombocytopenia - low platelets in the past, have normalzied. Underoing evalaution by heme for possible myeloproliferative disorder.  Past Medical History:  Diagnosis Date  . Anxiety   . Chronic back pain   . Diabetes mellitus   . Gastritis   . GERD (gastroesophageal reflux disease)   . Hypertension   . Renal insufficiency      Allergies  Allergen Reactions  . Hydrocodone Itching     Current Outpatient Prescriptions  Medication Sig Dispense Refill  . acetaminophen (TYLENOL) 500 MG tablet Take 500 mg by mouth every 6 (six) hours as needed for mild pain or moderate pain.    . clonazePAM (KLONOPIN) 0.5 MG tablet Take 0.5 mg by mouth 4 (four) times daily.    . cyclobenzaprine (FLEXERIL) 5 MG tablet Take 1 tablet (5 mg total) by mouth 3 (three) times daily as needed for muscle spasms. 20 tablet 0  . diazepam (VALIUM) 10 MG tablet Take 1 tablet (10 mg total) by mouth 2 (two) times daily. (Patient not taking: Reported on 08/08/2015) 20 tablet 0  . DULoxetine (CYMBALTA) 60 MG capsule Take 1 capsule by mouth daily.     Marland Kitchen HYDROmorphone (DILAUDID) 4 MG tablet Take 1 tablet (4 mg total) by mouth every 4 (four) hours as needed for severe pain. (Patient not taking: Reported on  08/08/2015) 20 tablet 0  . lisinopril (PRINIVIL,ZESTRIL) 20 MG tablet Take 1 tablet by mouth daily.    . metFORMIN (GLUCOPHAGE) 500 MG tablet Take 1,000 mg by mouth 2 (two) times daily with a meal.     . naproxen (NAPROSYN) 500 MG tablet Take 1 tablet (500 mg total) by mouth 2 (two) times daily. 30 tablet 0  . omeprazole (PRILOSEC) 20 MG capsule Take 20 mg by mouth daily.     Marland Kitchen oxyCODONE-acetaminophen (PERCOCET/ROXICET) 5-325 MG tablet Take 1 tablet by mouth every 4 (four) hours as needed. 20 tablet 0  . traMADol (ULTRAM) 50 MG tablet Take 50 mg by mouth 3 (three) times daily.     No current facility-administered medications for this visit.      Past Surgical History:  Procedure Laterality Date  . BACK SURGERY    . COLONOSCOPY WITH PROPOFOL N/A 02/01/2013   SLF: 1. 23 colon polyps removed. 2 retrieved. 2. Mild diverticulosis in teh sigmoid colon 3. Small internal hemorrhoids 4. The colon is redundant   . ESOPHAGOGASTRODUODENOSCOPY (EGD) WITH PROPOFOL N/A 02/01/2013   SLF: 1. Moderate erosive gastritis  . POLYPECTOMY N/A 02/01/2013   Procedure: POLYPECTOMY;  Surgeon: Danie Binder, MD;  Location: [facility];  Service: Endoscopy;  Laterality: N/A;     Allergies  Allergen Reactions  . Hydrocodone Itching      Family History  Problem Relation Age of  Onset  . Colon cancer Neg Hx      Social History Mr. Farquhar reports that he quit smoking about 2 years ago. His smoking use included Cigars. He has a 20.00 pack-year smoking history. He has never used smokeless tobacco. Mr. Poffenberger reports that he does not drink alcohol.   Review of Systems CONSTITUTIONAL: No weight loss, fever, chills, weakness or fatigue.  HEENT: Eyes: No visual loss, blurred vision, double vision or yellow sclerae.No hearing loss, sneezing, congestion, runny nose or sore throat.  SKIN: No rash or itching.  CARDIOVASCULAR: per HPI RESPIRATORY: No shortness of breath, cough or sputum.  GASTROINTESTINAL: No anorexia,  nausea, vomiting or diarrhea. No abdominal pain or blood.  GENITOURINARY: No burning on urination, no polyuria NEUROLOGICAL: No headache, dizziness, syncope, paralysis, ataxia, numbness or tingling in the extremities. No change in bowel or bladder control.  MUSCULOSKELETAL: No muscle, back pain, joint pain or stiffness.  LYMPHATICS: No enlarged nodes. No history of splenectomy.  PSYCHIATRIC: No history of depression or anxiety.  ENDOCRINOLOGIC: No reports of sweating, cold or heat intolerance. No polyuria or polydipsia.  Marland Kitchen   Physical Examination Vitals:   04/10/16 1316 04/10/16 1323  BP: 140/64 (!) 144/64  Pulse: 69    Vitals:   04/10/16 1316  Weight: 197 lb (89.4 kg)  Height: 6' (1.829 m)    Gen: resting comfortably, no acute distress HEENT: no scleral icterus, pupils equal round and reactive, no palptable cervical adenopathy,  CV: RRR, no m/r/g, no jvd Resp: Clear to auscultation bilaterally GI: abdomen is soft, non-tender, non-distended, normal bowel sounds, no hepatosplenomegaly MSK: extremities are warm, no edema.  Skin: warm, no rash Neuro:  no focal deficits Psych: appropriate affect      Assessment and Plan  1. Chest pain - recent abnormal nuclear stress test, multiple CAD risk factors - we discussed a cath. He is somewhat heistant at this time. We will also need to touch base with his hematolgist - continue ASA 81, beta blocker - if he elects for cath, likely would need admit night prior for IVFs given his decrased renal function - counseled if recurrent chest pain to notify our office   2. CKD III - has a new patient upcoming with Dr Hinda Lenis - if we pursue cath likely will need admit night before for IVF, minimal dye use.    F/u 2-3 weeks.       Arnoldo Lenis, M.D.

## 2016-04-10 NOTE — Patient Instructions (Signed)
Medication Instructions:  Your physician recommends that you continue on your current medications as directed. Please refer to the Current Medication list given to you today.   Labwork: NONE  Testing/Procedures: NONE  Follow-Up: Your physician recommends that you schedule a follow-up appointment in: 2 WEEKS     Any Other Special Instructions Will Be Listed Below (If Applicable).     If you need a refill on your cardiac medications before your next appointment, please call your pharmacy.

## 2016-04-11 ENCOUNTER — Other Ambulatory Visit (HOSPITAL_COMMUNITY): Payer: Self-pay | Admitting: Nephrology

## 2016-04-11 DIAGNOSIS — N183 Chronic kidney disease, stage 3 unspecified: Secondary | ICD-10-CM

## 2016-04-14 LAB — MULTIPLE MYELOMA PANEL, SERUM
ALBUMIN/GLOB SERPL: 1.3 (ref 0.7–1.7)
ALPHA 1: 0.3 g/dL (ref 0.0–0.4)
Albumin SerPl Elph-Mcnc: 3.3 g/dL (ref 2.9–4.4)
Alpha2 Glob SerPl Elph-Mcnc: 0.8 g/dL (ref 0.4–1.0)
B-Globulin SerPl Elph-Mcnc: 1 g/dL (ref 0.7–1.3)
Gamma Glob SerPl Elph-Mcnc: 0.6 g/dL (ref 0.4–1.8)
Globulin, Total: 2.7 g/dL (ref 2.2–3.9)
IGM, SERUM: 123 mg/dL (ref 20–172)
IgA: 139 mg/dL (ref 61–437)
IgG (Immunoglobin G), Serum: 602 mg/dL — ABNORMAL LOW (ref 700–1600)
Total Protein ELP: 6 g/dL (ref 6.0–8.5)

## 2016-04-14 LAB — JAK2 GENOTYPR

## 2016-04-15 ENCOUNTER — Other Ambulatory Visit: Payer: Self-pay

## 2016-04-15 ENCOUNTER — Observation Stay (HOSPITAL_COMMUNITY): Payer: Medicare Other

## 2016-04-15 ENCOUNTER — Encounter (HOSPITAL_COMMUNITY): Payer: Self-pay | Admitting: Emergency Medicine

## 2016-04-15 ENCOUNTER — Emergency Department (HOSPITAL_COMMUNITY): Payer: Medicare Other

## 2016-04-15 ENCOUNTER — Observation Stay (HOSPITAL_COMMUNITY)
Admission: EM | Admit: 2016-04-15 | Discharge: 2016-04-16 | Disposition: A | Discharge status: Home / Self Care | Payer: Medicare Other | Attending: Family Medicine | Admitting: Family Medicine

## 2016-04-15 ENCOUNTER — Ambulatory Visit: Payer: Medicare Other | Admitting: Urology

## 2016-04-15 DIAGNOSIS — N183 Chronic kidney disease, stage 3 (moderate): Secondary | ICD-10-CM

## 2016-04-15 DIAGNOSIS — R0789 Other chest pain: Principal | ICD-10-CM | POA: Insufficient documentation

## 2016-04-15 DIAGNOSIS — S46012A Strain of muscle(s) and tendon(s) of the rotator cuff of left shoulder, initial encounter: Secondary | ICD-10-CM | POA: Diagnosis not present

## 2016-04-15 DIAGNOSIS — I2510 Atherosclerotic heart disease of native coronary artery without angina pectoris: Secondary | ICD-10-CM | POA: Diagnosis not present

## 2016-04-15 DIAGNOSIS — R079 Chest pain, unspecified: Secondary | ICD-10-CM

## 2016-04-15 DIAGNOSIS — D72829 Elevated white blood cell count, unspecified: Secondary | ICD-10-CM | POA: Diagnosis present

## 2016-04-15 DIAGNOSIS — M25512 Pain in left shoulder: Secondary | ICD-10-CM | POA: Diagnosis present

## 2016-04-15 DIAGNOSIS — K219 Gastro-esophageal reflux disease without esophagitis: Secondary | ICD-10-CM | POA: Diagnosis present

## 2016-04-15 DIAGNOSIS — I25119 Atherosclerotic heart disease of native coronary artery with unspecified angina pectoris: Secondary | ICD-10-CM | POA: Diagnosis not present

## 2016-04-15 DIAGNOSIS — I10 Essential (primary) hypertension: Secondary | ICD-10-CM | POA: Insufficient documentation

## 2016-04-15 DIAGNOSIS — E119 Type 2 diabetes mellitus without complications: Secondary | ICD-10-CM | POA: Diagnosis not present

## 2016-04-15 DIAGNOSIS — N186 End stage renal disease: Secondary | ICD-10-CM | POA: Diagnosis present

## 2016-04-15 DIAGNOSIS — Z7984 Long term (current) use of oral hypoglycemic drugs: Secondary | ICD-10-CM | POA: Diagnosis not present

## 2016-04-15 DIAGNOSIS — Z992 Dependence on renal dialysis: Secondary | ICD-10-CM | POA: Diagnosis present

## 2016-04-15 DIAGNOSIS — Z7982 Long term (current) use of aspirin: Secondary | ICD-10-CM | POA: Insufficient documentation

## 2016-04-15 DIAGNOSIS — Z87891 Personal history of nicotine dependence: Secondary | ICD-10-CM | POA: Diagnosis not present

## 2016-04-15 DIAGNOSIS — E1122 Type 2 diabetes mellitus with diabetic chronic kidney disease: Secondary | ICD-10-CM | POA: Diagnosis not present

## 2016-04-15 DIAGNOSIS — R0781 Pleurodynia: Secondary | ICD-10-CM | POA: Diagnosis present

## 2016-04-15 DIAGNOSIS — I2583 Coronary atherosclerosis due to lipid rich plaque: Secondary | ICD-10-CM

## 2016-04-15 LAB — COMPREHENSIVE METABOLIC PANEL
ALK PHOS: 128 U/L — AB (ref 38–126)
ALT: 27 U/L (ref 17–63)
ANION GAP: 8 (ref 5–15)
AST: 41 U/L (ref 15–41)
Albumin: 3.9 g/dL (ref 3.5–5.0)
BILIRUBIN TOTAL: 0.6 mg/dL (ref 0.3–1.2)
BUN: 18 mg/dL (ref 6–20)
CALCIUM: 8.7 mg/dL — AB (ref 8.9–10.3)
CHLORIDE: 102 mmol/L (ref 101–111)
CO2: 28 mmol/L (ref 22–32)
Creatinine, Ser: 1.73 mg/dL — ABNORMAL HIGH (ref 0.61–1.24)
GFR, EST AFRICAN AMERICAN: 44 mL/min — AB (ref >60)
GFR, EST NON AFRICAN AMERICAN: 38 mL/min — AB (ref >60)
GLUCOSE: 98 mg/dL (ref 65–99)
Potassium: 4.2 mmol/L (ref 3.5–5.1)
Sodium: 138 mmol/L (ref 135–145)
TOTAL PROTEIN: 7.2 g/dL (ref 6.5–8.1)

## 2016-04-15 LAB — URIC ACID: URIC ACID, SERUM: 8 mg/dL — AB (ref 4.4–7.6)

## 2016-04-15 LAB — URINALYSIS, ROUTINE W REFLEX MICROSCOPIC
Glucose, UA: NEGATIVE mg/dL
Hgb urine dipstick: NEGATIVE
KETONES UR: NEGATIVE mg/dL
Leukocytes, UA: NEGATIVE
NITRITE: NEGATIVE
Specific Gravity, Urine: >1.03 — ABNORMAL HIGH (ref 1.005–1.030)
pH: 6 (ref 5.0–8.0)

## 2016-04-15 LAB — CBC WITH DIFFERENTIAL/PLATELET
BAND NEUTROPHILS: 23 %
BASOS PCT: 8 %
Blasts: 5 %
Eosinophils Relative: 2 %
HEMATOCRIT: 42.4 % (ref 39.0–52.0)
HEMOGLOBIN: 14.1 g/dL (ref 13.0–17.0)
Lymphocytes Relative: 33 %
MCH: 30.1 pg (ref 26.0–34.0)
MCHC: 33.3 g/dL (ref 30.0–36.0)
MCV: 90.4 fL (ref 78.0–100.0)
Metamyelocytes Relative: 9 %
Monocytes Relative: 2 %
Myelocytes: 0 %
Neutrophils Relative %: 18 %
Other: 0 %
PROMYELOCYTES ABS: 0 %
Platelets: 607 10*3/uL — ABNORMAL HIGH (ref 150–400)
RBC: 4.69 MIL/uL (ref 4.22–5.81)
RDW: 22.8 % — ABNORMAL HIGH (ref 11.5–15.5)
WBC: 22.6 10*3/uL — ABNORMAL HIGH (ref 4.0–10.5)
nRBC: 3 /100 WBC — ABNORMAL HIGH

## 2016-04-15 LAB — URINE MICROSCOPIC-ADD ON

## 2016-04-15 LAB — GLUCOSE, CAPILLARY
GLUCOSE-CAPILLARY: 179 mg/dL — AB (ref 65–99)
GLUCOSE-CAPILLARY: 121 mg/dL — AB (ref 65–99)

## 2016-04-15 LAB — PATHOLOGIST SMEAR REVIEW

## 2016-04-15 LAB — I-STAT TROPONIN, ED: TROPONIN I, POC: 0 ng/mL (ref 0.00–0.08)

## 2016-04-15 LAB — SEDIMENTATION RATE: SED RATE: 25 mm/h — AB (ref 0–16)

## 2016-04-15 LAB — TROPONIN I

## 2016-04-15 MED ORDER — METOPROLOL SUCCINATE ER 25 MG PO TB24
25.0000 mg | ORAL_TABLET | Freq: Every day | ORAL | Status: DC
  Administered 2016-04-16: 25 mg via ORAL
  Filled 2016-04-15: qty 1

## 2016-04-15 MED ORDER — NITROGLYCERIN 0.4 MG SL SUBL
0.4000 mg | SUBLINGUAL_TABLET | Freq: Once | SUBLINGUAL | Status: AC
  Administered 2016-04-15: 0.4 mg via SUBLINGUAL
  Filled 2016-04-15: qty 1

## 2016-04-15 MED ORDER — MORPHINE SULFATE (PF) 4 MG/ML IV SOLN
4.0000 mg | Freq: Once | INTRAVENOUS | Status: AC
  Administered 2016-04-15: 4 mg via INTRAVENOUS

## 2016-04-15 MED ORDER — SODIUM CHLORIDE 0.9% FLUSH: 3.0000 mL | Freq: Two times a day (BID) | INTRAVENOUS | Status: DC

## 2016-04-15 MED ORDER — ACETAMINOPHEN 650 MG RE SUPP: 650.0000 mg | Freq: Four times a day (QID) | RECTAL | Status: DC | PRN

## 2016-04-15 MED ORDER — MORPHINE SULFATE (PF) 4 MG/ML IV SOLN
4.0000 mg | Freq: Once | INTRAVENOUS | Status: AC
  Administered 2016-04-15: 4 mg via INTRAVENOUS
  Filled 2016-04-15: qty 1

## 2016-04-15 MED ORDER — ASPIRIN EC 81 MG PO TBEC
81.0000 mg | DELAYED_RELEASE_TABLET | Freq: Every day | ORAL | Status: DC
  Administered 2016-04-16: 81 mg via ORAL
  Filled 2016-04-15: qty 1

## 2016-04-15 MED ORDER — MORPHINE SULFATE (PF) 4 MG/ML IV SOLN
INTRAVENOUS | Status: AC
  Filled 2016-04-15: qty 1

## 2016-04-15 MED ORDER — ACETAMINOPHEN 325 MG PO TABS
650.0000 mg | ORAL_TABLET | Freq: Four times a day (QID) | ORAL | Status: DC | PRN
Start: 2016-04-15 — End: 2016-04-16

## 2016-04-15 MED ORDER — SODIUM CHLORIDE 0.9 % IV SOLN
INTRAVENOUS | Status: DC
  Administered 2016-04-15: 16:00:00 via INTRAVENOUS

## 2016-04-15 MED ORDER — OXYCODONE HCL 5 MG PO TABS
5.0000 mg | ORAL_TABLET | ORAL | Status: DC | PRN
  Administered 2016-04-16: 5 mg via ORAL
  Filled 2016-04-15: qty 1

## 2016-04-15 MED ORDER — MORPHINE SULFATE (PF) 2 MG/ML IV SOLN
2.0000 mg | INTRAVENOUS | Status: DC | PRN
  Administered 2016-04-15: 2 mg via INTRAVENOUS
  Administered 2016-04-15 – 2016-04-16 (×4): 4 mg via INTRAVENOUS
  Filled 2016-04-15 (×5): qty 2

## 2016-04-15 MED ORDER — ONDANSETRON HCL 4 MG/2ML IJ SOLN
4.0000 mg | Freq: Four times a day (QID) | INTRAMUSCULAR | Status: DC | PRN
Start: 2016-04-15 — End: 2016-04-16

## 2016-04-15 MED ORDER — ONDANSETRON HCL 4 MG PO TABS: 4.0000 mg | ORAL_TABLET | Freq: Four times a day (QID) | ORAL | Status: DC | PRN

## 2016-04-15 MED ORDER — INSULIN ASPART 100 UNIT/ML ~~LOC~~ SOLN: 0.0000 [IU] | Freq: Every day | SUBCUTANEOUS | Status: DC

## 2016-04-15 MED ORDER — INSULIN ASPART 100 UNIT/ML ~~LOC~~ SOLN
0.0000 [IU] | Freq: Three times a day (TID) | SUBCUTANEOUS | Status: DC
  Administered 2016-04-15: 3 [IU] via SUBCUTANEOUS
  Administered 2016-04-16: 2 [IU] via SUBCUTANEOUS

## 2016-04-15 MED ORDER — HEPARIN SODIUM (PORCINE) 5000 UNIT/ML IJ SOLN
5000.0000 [IU] | Freq: Three times a day (TID) | INTRAMUSCULAR | Status: DC
  Administered 2016-04-15 – 2016-04-16 (×3): 5000 [IU] via SUBCUTANEOUS
  Filled 2016-04-15 (×2): qty 1

## 2016-04-15 MED ORDER — PANTOPRAZOLE SODIUM 40 MG PO TBEC
40.0000 mg | DELAYED_RELEASE_TABLET | Freq: Every day | ORAL | Status: DC
  Administered 2016-04-16: 40 mg via ORAL
  Filled 2016-04-15: qty 1

## 2016-04-15 MED ORDER — DULOXETINE HCL 60 MG PO CPEP
60.0000 mg | ORAL_CAPSULE | Freq: Every day | ORAL | Status: DC
  Administered 2016-04-16: 60 mg via ORAL
  Filled 2016-04-15: qty 1

## 2016-04-15 MED ORDER — CYCLOBENZAPRINE HCL 10 MG PO TABS
5.0000 mg | ORAL_TABLET | Freq: Three times a day (TID) | ORAL | Status: DC | PRN
  Administered 2016-04-15 – 2016-04-16 (×2): 5 mg via ORAL
  Filled 2016-04-15 (×2): qty 1

## 2016-04-15 NOTE — Progress Notes (Signed)
Went to see patient at request of Dr. Roderic Palau but he was in MRI. Spoke with family members and outlined management plan of checking serial serum troponins. He missed appt with nephrology today so this will need to be rescheduled. He already has an appt with Dr. Harl Bowie on 10/5 which he should keep so that coronary angiography can be revisited. Patient's family in agreement with this plan. Discussed with Dr. Roderic Palau.  I will see patient on 9/27.

## 2016-04-15 NOTE — H&P (Signed)
History and Physical    [name]:[id number] DOB: [birth year] DOA: 04/15/2016  PCP: Kirstie Peri, MD  Patient coming from: home  Chief Complaint: chest pain/shoulder pain  HPI: [name] is a 70 y.o. male with medical history significant of hypertension, diabetes, GERD and chronic kidney disease stage III. Patient presents to the emergency room today with complaints of chest pain. He reports the pain as radiating across his chest that began at 1 AM this morning. He has been constant since onset. He also has left shoulder pain which occurred at 1 AM this morning. He is unable to move his shoulder with no significant pain. He reports that when he moves his shoulder this also worsens his chest pain. Difficult to ascertain if his chest pain is really radiation of shoulder pain or if he has a primary chest pain. He reports associated diaphoresis and shortness of breath. He has not had any fever. No nausea or vomiting. No lightheadedness.  Patient was recently seen in cardiology office by Dr. Wyline Mood on 9/21. At that time it was reported that he recently had a positive stress test and recommendations were for cardiac catheterization. Per Dr. Verna Czech note, patient was hesitant and therefore cardiac catheterization was not scheduled at that time. He also recently saw hematology on 9/20 with Dr. Candise Che when he was evaluated for abnormalities in his CBC (previous thrombocytopenia and bandemia). He is being worked up for myeloproliferative disorders. A battery of blood tests were sent at that time  ED Course: In the emergency room, patient's EKG did not show any acute signs of ischemia. Cardiac enzymes found to be negative. Chest x-ray did not show any acute findings. X-ray of the left shoulder show bony spurs, but no fracture or dislocation. WBC count was notably elevated at 22,000. Platelets were over 600,000. Hemoglobin was normal at 14. Since patient has recurrent pain, with recently positive  stress test, patient has been referred for admission  Review of Systems: As per HPI otherwise 10 point review of systems negative.   Past Medical History:  Diagnosis Date  . Anxiety   . Chronic back pain   . Diabetes mellitus   . Gastritis   . GERD (gastroesophageal reflux disease)   . Hypertension   . Renal insufficiency     Past Surgical History:  Procedure Laterality Date  . BACK SURGERY    . COLONOSCOPY WITH PROPOFOL N/A 02/01/2013   SLF: 1. 23 colon polyps removed. 2 retrieved. 2. Mild diverticulosis in teh sigmoid colon 3. Small internal hemorrhoids 4. The colon is redundant   . ESOPHAGOGASTRODUODENOSCOPY (EGD) WITH PROPOFOL N/A 02/01/2013   SLF: 1. Moderate erosive gastritis  . POLYPECTOMY N/A 02/01/2013   Procedure: POLYPECTOMY;  Surgeon: West Bali, MD;  Location: [facility];  Service: Endoscopy;  Laterality: N/A;     reports that he quit smoking about 2 years ago. His smoking use included Cigars. He has a 20.00 pack-year smoking history. He has never used smokeless tobacco. He reports that he does not drink alcohol or use drugs.  Allergies  Allergen Reactions  . Hydrocodone Itching    Family History  Problem Relation Age of Onset  . Colon cancer Neg Hx      Prior to Admission medications   Medication Sig Start Date End Date Taking? Authorizing Provider  acetaminophen (TYLENOL) 500 MG tablet Take 500 mg by mouth every 6 (six) hours as needed for mild pain or moderate pain.   Yes Historical Provider, MD  aspirin  EC 81 MG tablet Take 81 mg by mouth daily.   Yes Historical Provider, MD  cyclobenzaprine (FLEXERIL) 5 MG tablet Take 1 tablet (5 mg total) by mouth 3 (three) times daily as needed for muscle spasms. 08/08/15  Yes Evalee Jefferson, PA-C  DULoxetine (CYMBALTA) 60 MG capsule Take 1 capsule by mouth daily.  09/19/13  Yes Historical Provider, MD  lisinopril (PRINIVIL,ZESTRIL) 20 MG tablet Take 20 mg by mouth daily.   Yes Historical Provider, MD  metFORMIN (GLUCOPHAGE)  500 MG tablet Take 500 mg by mouth daily.   Yes Historical Provider, MD  metoprolol succinate (TOPROL-XL) 25 MG 24 hr tablet Take 25 mg by mouth daily.   Yes Historical Provider, MD  omeprazole (PRILOSEC) 20 MG capsule Take 20 mg by mouth daily.  01/14/13  Yes Historical Provider, MD    Physical Exam: Vitals:   04/15/16 1130 04/15/16 1200 04/15/16 1230 04/15/16 1300  BP: 135/79 153/73 142/80 157/77  Pulse: 66 65 66 67  Resp: '17 11 19 20  '$ Temp:      TempSrc:      SpO2: 96% 94% 97% 94%  Weight:      Height:          Constitutional: NAD, calm, comfortable Vitals:   04/15/16 1130 04/15/16 1200 04/15/16 1230 04/15/16 1300  BP: 135/79 153/73 142/80 157/77  Pulse: 66 65 66 67  Resp: '17 11 19 20  '$ Temp:      TempSrc:      SpO2: 96% 94% 97% 94%  Weight:      Height:       Eyes: PERRL, lids and conjunctivae normal ENMT: Mucous membranes are moist. Posterior pharynx clear of any exudate or lesions.Normal dentition.  Neck: normal, supple, no masses, no thyromegaly Respiratory: clear to auscultation bilaterally, no wheezing, no crackles. Normal respiratory effort. No accessory muscle use.  Cardiovascular: Regular rate and rhythm, no murmurs / rubs / gallops. No extremity edema. 2+ pedal pulses. No carotid bruits.  Abdomen: no tenderness, no masses palpated. No hepatosplenomegaly. Bowel sounds positive.  Musculoskeletal: no clubbing / cyanosis. Diminished range of motion in active and passive motion of left shoulder. Tenderness to palpation over medial aspect of shoulder. No erythema or warmth. Normal muscle tone.  Skin: no rashes, lesions, ulcers. No induration Neurologic: CN 2-12 grossly intact. Sensation intact, DTR normal. Strength is 5/5 in all extremities except for LUE. Could not assess LUE due to pain Psychiatric: Normal judgment and insight. Alert and oriented x 3. Normal mood.     Labs on Admission: I have personally reviewed following labs and imaging studies  CBC:  Recent  Labs Lab 04/09/16 1200 04/15/16 0919  WBC 12.1* 22.6*  NEUTROABS 6.4  --   HGB 13.5 14.1  HCT 40.6 42.4  MCV 89.6 90.4  PLT 430* 177*   Basic Metabolic Panel:  Recent Labs Lab 04/09/16 1125 04/15/16 0919  NA 136 138  K 4.7 4.2  CL 104 102  CO2 29 28  GLUCOSE 112* 98  BUN 23* 18  CREATININE 1.82* 1.73*  CALCIUM 8.3* 8.7*   GFR: Estimated Creatinine Clearance: 43.6 mL/min (by C-G formula based on SCr of 1.73 mg/dL (H)). Liver Function Tests:  Recent Labs Lab 04/09/16 1125 04/15/16 0919  AST 34 41  ALT 20 27  ALKPHOS 114 128*  BILITOT 0.6 0.6  PROT 6.7 7.2  ALBUMIN 3.6 3.9   No results for input(s): LIPASE, AMYLASE in the last 168 hours. No results for input(s): AMMONIA in  the last 168 hours. Coagulation Profile: No results for input(s): INR, PROTIME in the last 168 hours. Cardiac Enzymes:  Recent Labs Lab 04/15/16 0919  TROPONINI <0.03   BNP (last 3 results) No results for input(s): PROBNP in the last 8760 hours. HbA1C: No results for input(s): HGBA1C in the last 72 hours. CBG: No results for input(s): GLUCAP in the last 168 hours. Lipid Profile: No results for input(s): CHOL, HDL, LDLCALC, TRIG, CHOLHDL, LDLDIRECT in the last 72 hours. Thyroid Function Tests: No results for input(s): TSH, T4TOTAL, FREET4, T3FREE, THYROIDAB in the last 72 hours. Anemia Panel: No results for input(s): VITAMINB12, FOLATE, FERRITIN, TIBC, IRON, RETICCTPCT in the last 72 hours. Urine analysis:    Component Value Date/Time   COLORURINE YELLOW 03/26/2008 1920   APPEARANCEUR CLEAR 03/26/2008 1920   LABSPEC <1.005 (L) 03/26/2008 1920   PHURINE 6.0 03/26/2008 1920   GLUCOSEU NEGATIVE 03/26/2008 1920   HGBUR MODERATE (A) 03/26/2008 1920   BILIRUBINUR NEGATIVE 03/26/2008 1920   KETONESUR NEGATIVE 03/26/2008 1920   PROTEINUR NEGATIVE 03/26/2008 1920   UROBILINOGEN 0.2 03/26/2008 1920   NITRITE NEGATIVE 03/26/2008 1920   LEUKOCYTESUR NEGATIVE 03/26/2008 1920   Sepsis  Labs: !!!!!!!!!!!!!!!!!!!!!!!!!!!!!!!!!!!!!!!!!!!! '@LABRCNTIP'$ (procalcitonin:4,lacticidven:4) )No results found for this or any previous visit (from the past 240 hour(s)).   Radiological Exams on Admission: Dg Chest 2 View  Result Date: 04/15/2016 CLINICAL DATA:  Mid and left-sided chest pain began at 3 a.m. today. Unable to raise the left arm due to pain. EXAM: CHEST  2 VIEW COMPARISON:  Portable chest x-ray dated March 31, 2016. FINDINGS: The lungs are mildly hypoinflated but clear. There is no pneumothorax or pleural effusion. The heart and pulmonary vascularity are normal. The mediastinum is normal in width. There is calcification in the wall of the thoracic aorta. The bony thorax exhibits no acute abnormality. IMPRESSION: Mild hypoinflation.  No acute cardiopulmonary abnormality. Aortic atherosclerosis. Electronically Signed   By: David  Martinique M.D.   On: 04/15/2016 10:09   Dg Shoulder Left  Result Date: 04/15/2016 CLINICAL DATA:  Onset of left shoulder pain this morning without known injury EXAM: LEFT SHOULDER - 2+ VIEW COMPARISON:  None in PACs FINDINGS: The bones are subjectively adequately mineralized. The AP view is suboptimally positioned. No acute fracture is observed. The glenohumeral joint space is reasonably well-maintained. The subacromial subdeltoid space is normal. A small spur arises from the inferior tip of the left clavicle. IMPRESSION: No acute bony abnormality of the left shoulder. Small spur arises from the inferior articular margin of the right clavicle and may be impacting the superior aspect of the rotator cuff. Electronically Signed   By: David  Martinique M.D.   On: 04/15/2016 12:03    EKG: Independently reviewed. No acute ST-T changes  Assessment/Plan Active Problems:   GERD (gastroesophageal reflux disease)   CAD (coronary artery disease)   Leukocytosis   Diabetes mellitus (HCC)   Chest pain   CKD (chronic kidney disease) stage 3, GFR 30-59 ml/min   Left shoulder  pain   1. Chest pain. Appears to be somewhat atypical and reproducible on palpation, the patient does have history of coronary artery disease with recent positive stress test. We'll continue to cycle troponins. Cardiology consult has been requested. Continue on aspirin  2. Left shoulder pain. Appears to be an issue within the left shoulder joint. He is very tender to palpation and has decreased range of motion. X-ray of the shoulder is unremarkable. Will check MRI of the shoulder. May need to consider  orthopedic consultation if symptoms persist. Continue pain management.  3. Chronic kidney disease stage III. Creatinine appears to be near baseline. He was scheduled to follow-up with nephrology today, but ended up in the hospital. Start the patient on IV hydration in preparation for possible cardiac catheterization. Will hold ACE inhibitor.  4. Diabetes. Hold oral agents. Use sliding scale insulin.  5. Coronary artery disease. Will likely need cardiac catheterization. Inpatient versus outpatient defer to cardiology.  6. Leukocytosis. Etiology is unclear. Question if he has an underlying myeloproliferative disorder. Since he has been diaphoretic and has increased pain in shoulder, will check blood cultures, uric acid and ESR.  7. Hypertension. Blood pressure is currently stable. Continue to monitor.  8. GERD. Continue proton pump inhibitors.    DVT prophylaxis: heparin  Code Status: full code Family Communication: discussed with wife and children at the bedside Disposition Plan: discharge home if no plans on inpatient cardiac catheterization Consults called: cardiology Admission status: observation, telemetry   Alan Zobrist MD [facility] Pager [phone]  If 7PM-7AM, please contact night-coverage [email or web address] Password [email or web address]  04/15/2016, 2:12 PM

## 2016-04-15 NOTE — ED Notes (Signed)
Pt requesting pain medication. New order to reorder morphine for pain

## 2016-04-15 NOTE — ED Provider Notes (Signed)
[facility] DEPT Provider Note   CSN: [id number] Arrival date & time: 04/15/16  0901     History   Chief Complaint Chief Complaint  Patient presents with  . Chest Pain    HPI [name] is a 70 y.o. male with recent history of chest pain and abnormal stress chest at [facility], HTN, DM2, presenting for chest pain and chest pressure that started at approximately 4 AM today. It is located in the left side of his chest, with radaition to his left shoulder and neck. He noted sweating associated. He denies shortness of breath, nausea, abdomonal pain.  He notes in past when he had chest pain he had more pressure but still it feels similar [name] he denies recent illness, fever, diarrhea   Chest Pain   Pertinent negatives include no palpitations.    Past Medical History:  Diagnosis Date  . Anxiety   . Chronic back pain   . Diabetes mellitus   . Gastritis   . GERD (gastroesophageal reflux disease)   . Hypertension   . Renal insufficiency     Patient Active Problem List   Diagnosis Date Noted  . Encounter for screening colonoscopy 01/21/2013  . GERD (gastroesophageal reflux disease) 01/21/2013    Past Surgical History:  Procedure Laterality Date  . BACK SURGERY    . COLONOSCOPY WITH PROPOFOL N/A 02/01/2013   SLF: 1. 23 colon polyps removed. 2 retrieved. 2. Mild diverticulosis in teh sigmoid colon 3. Small internal hemorrhoids 4. The colon is redundant   . ESOPHAGOGASTRODUODENOSCOPY (EGD) WITH PROPOFOL N/A 02/01/2013   SLF: 1. Moderate erosive gastritis  . POLYPECTOMY N/A 02/01/2013   Procedure: POLYPECTOMY;  Surgeon: Danie Binder, MD;  Location: [facility];  Service: Endoscopy;  Laterality: N/A;       Home Medications    Prior to Admission medications   Medication Sig Start Date End Date Taking? Authorizing Provider  acetaminophen (TYLENOL) 500 MG tablet Take 500 mg by mouth every 6 (six) hours as needed for mild pain or moderate pain.   Yes Historical  Provider, MD  aspirin EC 81 MG tablet Take 81 mg by mouth daily.   Yes Historical Provider, MD  cyclobenzaprine (FLEXERIL) 5 MG tablet Take 1 tablet (5 mg total) by mouth 3 (three) times daily as needed for muscle spasms. 08/08/15  Yes Evalee Jefferson, PA-C  DULoxetine (CYMBALTA) 60 MG capsule Take 1 capsule by mouth daily.  09/19/13  Yes Historical Provider, MD  lisinopril (PRINIVIL,ZESTRIL) 20 MG tablet Take 20 mg by mouth daily.   Yes Historical Provider, MD  metFORMIN (GLUCOPHAGE) 500 MG tablet Take 500 mg by mouth daily.   Yes Historical Provider, MD  metoprolol succinate (TOPROL-XL) 25 MG 24 hr tablet Take 25 mg by mouth daily.   Yes Historical Provider, MD  omeprazole (PRILOSEC) 20 MG capsule Take 20 mg by mouth daily.  01/14/13  Yes Historical Provider, MD  oxyCODONE-acetaminophen (PERCOCET/ROXICET) 5-325 MG tablet Take 1 tablet by mouth every 4 (four) hours as needed. Patient not taking: Reported on 04/15/2016 08/08/15   Evalee Jefferson, PA-C    Family History Family History  Problem Relation Age of Onset  . Colon cancer Neg Hx   Father CVA Mother CAD  Social History Social History  Substance Use Topics  . Smoking status: Former Smoker    Packs/day: 1.00    Years: 20.00    Types: Cigars    Quit date: 08/04/2013  . Smokeless tobacco: Never Used  . Alcohol use  No     Comment: Remote history of ETOH abuse, "when I was young and dumb"      Allergies   Hydrocodone   Review of Systems Review of Systems  Cardiovascular: Positive for chest pain. Negative for palpitations and leg swelling.  Gastrointestinal: Negative.   Genitourinary: Negative.   Musculoskeletal:       Let shoulder pain   Neurological: Negative.      Physical Exam Updated Vital Signs BP 135/79   Pulse 66   Temp 97.7 F (36.5 C) (Oral)   Resp 17   Ht 6' (1.829 m)   Wt 89.4 kg   SpO2 96%   BMI 26.72 kg/m   Physical Exam  Constitutional: He appears well-developed and well-nourished.  HENT:  Head:  Normocephalic and atraumatic.  Eyes: EOM are normal. Pupils are equal, round, and reactive to light.  Neck: Normal range of motion.  Cardiovascular: Normal rate, regular rhythm and normal heart sounds.  Exam reveals no friction rub.   No murmur heard. Pulmonary/Chest: Effort normal and breath sounds normal. No respiratory distress.  Musculoskeletal: He exhibits tenderness. He exhibits no edema.  Left shoulder and scapula tenderness to palpation, reduced left shoulder range of motion  Skin: Skin is warm and dry. No rash noted.  Psychiatric: He has a normal mood and affect.     ED Treatments / Results  Labs (all labs ordered are listed, but only abnormal results are displayed) Labs Reviewed  CBC WITH DIFFERENTIAL/PLATELET - Abnormal; Notable for the following:       Result Value   WBC 22.6 (*)    RDW 22.8 (*)    Platelets 607 (*)    nRBC 3 (*)    All other components within normal limits  COMPREHENSIVE METABOLIC PANEL - Abnormal; Notable for the following:    Creatinine, Ser 1.73 (*)    Calcium 8.7 (*)    Alkaline Phosphatase 128 (*)    GFR calc non Af Amer 38 (*)    GFR calc Af Amer 44 (*)    All other components within normal limits  TROPONIN I  PATHOLOGIST SMEAR REVIEW  I-STAT TROPOININ, ED    EKG  EKG Interpretation None      EKG: Normal sinus rhythm, Rate 63 bpm, normal QTc 422, normal ST segments   Radiology Dg Chest 2 View  Result Date: 04/15/2016 CLINICAL DATA:  Mid and left-sided chest pain began at 3 a.m. today. Unable to raise the left arm due to pain. EXAM: CHEST  2 VIEW COMPARISON:  Portable chest x-ray dated March 31, 2016. FINDINGS: The lungs are mildly hypoinflated but clear. There is no pneumothorax or pleural effusion. The heart and pulmonary vascularity are normal. The mediastinum is normal in width. There is calcification in the wall of the thoracic aorta. The bony thorax exhibits no acute abnormality. IMPRESSION: Mild hypoinflation.  No acute  cardiopulmonary abnormality. Aortic atherosclerosis. Electronically Signed   By: David  Martinique M.D.   On: 04/15/2016 10:09    Procedures Procedures (including critical care time)  Medications Ordered in ED Medications  morphine 4 MG/ML injection 4 mg (4 mg Intravenous Given 04/15/16 0940)  nitroGLYCERIN (NITROSTAT) SL tablet 0.4 mg (0.4 mg Sublingual Given 04/15/16 0938)  morphine 4 MG/ML injection 4 mg (4 mg Intravenous Given 04/15/16 1042)     Initial Impression / Assessment and Plan / ED Course  I have reviewed the triage vital signs and the nursing notes.  Pertinent labs & imaging results that were  available during my care of the patient were reviewed by me and considered in my medical decision making (see chart for details).  Clinical Course    Uncomplicated ED course  Final Clinical Impressions(s) / ED Diagnoses   Final diagnoses:  Other chest pain   70 y/o M with PMH significant for recent history of chest pain and abnormal stress chest at [facility], HTN, DM2, presenting for chest pain and chest pressure that started at approximately 4 AM today. He remained stable in the ED and his chest pain resolved with SL nitro and morphine. His EKG was negative for evidence of ischemia. Given his recent history of abnormal stress test, cardiology was called and the recommendation was to obtain a cardiac catheterization at [facility].   He was additionally noted to have significant left shoulder pain without sweling or evidence or clinical infection. He had an xray of his left shoulder prior to transfer to [facility].  He was noted to have an elevated WBC as well elevated platelet count to 607 with abnormal morphologies of his hematologic cell lines. He has been followed by hematology for platelet abnormality. Additionally he was transferred for consideration of work up for these abnormalities  New Prescriptions New Prescriptions   No medications on file     Veatrice Bourbon,  MD 04/15/16 Floyd, MD 04/15/16 [place], MD 04/15/16 1544

## 2016-04-15 NOTE — Consult Note (Signed)
CARDIOLOGY CONSULT NOTE  Patient ID: [name] MRN: [id number] DOB/AGE: Aug 04, 1945 70 y.o.  Admit date: 04/15/2016 Primary Physician: [name], MD Referring Physician: Roderic Palau MD  Reason for Consultation: chest pain  HPI: 70 yr old male with recent positive stress test admitted for chest pain. Nuclear stress test at [facility] showed large area of inferolateral ischemia.  Has HTN, diabetes, CKD stage 3, and tobacco use with respect to CV risk factors. Also being worked up for myeloproliferative disorder. First troponin normal. ECG without ischemic abnormalities. Chest xray showed hyperinflation but no pulmonary edema. Coronary angiography discussed with patient by Dr. Harl Bowie on 9/21, but patient was hesitant. On ASA and beta blocker.  Shoulder MRI showed mild tendinosis of the supraspinatus tendon with a small tear, and mild subacromial bursitis.  No chest pain this morning. Does have shoulder pain and heard something "pop" last night when he turned in bed.    Allergies  Allergen Reactions  . Hydrocodone Itching    Current Facility-Administered Medications  Medication Dose Route Frequency Provider Last Rate Last Dose  . 0.9 %  sodium chloride infusion   Intravenous Continuous Kathie Dike, MD      . acetaminophen (TYLENOL) tablet 650 mg  650 mg Oral Q6H PRN Kathie Dike, MD       Or  . acetaminophen (TYLENOL) suppository 650 mg  650 mg Rectal Q6H PRN Kathie Dike, MD      . aspirin EC tablet 81 mg  81 mg Oral Daily Kathie Dike, MD      . cyclobenzaprine (FLEXERIL) tablet 5 mg  5 mg Oral TID PRN Kathie Dike, MD      . DULoxetine (CYMBALTA) DR capsule 60 mg  60 mg Oral Daily Kathie Dike, MD      . heparin injection 5,000 Units  5,000 Units Subcutaneous Q8H Kathie Dike, MD      . insulin aspart (novoLOG) injection 0-15 Units  0-15 Units Subcutaneous TID WC Kathie Dike, MD      . insulin aspart (novoLOG) injection 0-5 Units  0-5 Units  Subcutaneous QHS Kathie Dike, MD      . metoprolol succinate (TOPROL-XL) 24 hr tablet 25 mg  25 mg Oral Daily Kathie Dike, MD      . morphine 2 MG/ML injection 2-4 mg  2-4 mg Intravenous Q4H PRN Kathie Dike, MD      . morphine 4 MG/ML injection           . ondansetron (ZOFRAN) tablet 4 mg  4 mg Oral Q6H PRN Kathie Dike, MD       Or  . ondansetron (ZOFRAN) injection 4 mg  4 mg Intravenous Q6H PRN Kathie Dike, MD      . oxyCODONE (Oxy IR/ROXICODONE) immediate release tablet 5 mg  5 mg Oral Q4H PRN Kathie Dike, MD      . pantoprazole (PROTONIX) EC tablet 40 mg  40 mg Oral Daily Kathie Dike, MD      . sodium chloride flush (NS) 0.9 % injection 3 mL  3 mL Intravenous Q12H Kathie Dike, MD        Past Medical History:  Diagnosis Date  . Anxiety   . Chronic back pain   . Diabetes mellitus   . Gastritis   . GERD (gastroesophageal reflux disease)   . Hypertension   . Renal insufficiency     Past Surgical History:  Procedure Laterality Date  . BACK SURGERY    . COLONOSCOPY WITH  PROPOFOL N/A 02/01/2013   SLF: 1. 23 colon polyps removed. 2 retrieved. 2. Mild diverticulosis in teh sigmoid colon 3. Small internal hemorrhoids 4. The colon is redundant   . ESOPHAGOGASTRODUODENOSCOPY (EGD) WITH PROPOFOL N/A 02/01/2013   SLF: 1. Moderate erosive gastritis  . POLYPECTOMY N/A 02/01/2013   Procedure: POLYPECTOMY;  Surgeon: Danie Binder, MD;  Location: [facility];  Service: Endoscopy;  Laterality: N/A;    Social History   Social History  . Marital status: Divorced    Spouse name: N/A  . Number of children: N/A  . Years of education: N/A   Occupational History  . retired     Architect   Social History Main Topics  . Smoking status: Former Smoker    Packs/day: 1.00    Years: 20.00    Types: Cigars    Quit date: 08/04/2013  . Smokeless tobacco: Never Used  . 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 Drug use: No  .  Sexual activity: Yes    Birth control/ protection: None   Other Topics Concern  . Not on file   Social History Narrative  . No narrative on file     No family history of premature CAD in 1st degree relatives.  Prior to Admission medications   Medication Sig Start Date End Date Taking? Authorizing Provider  acetaminophen (TYLENOL) 500 MG tablet Take 500 mg by mouth every 6 (six) hours as needed for mild pain or moderate pain.   Yes Historical Provider, MD  aspirin EC 81 MG tablet Take 81 mg by mouth daily.   Yes Historical Provider, MD  cyclobenzaprine (FLEXERIL) 5 MG tablet Take 1 tablet (5 mg total) by mouth 3 (three) times daily as needed for muscle spasms. 08/08/15  Yes Evalee Jefferson, PA-C  DULoxetine (CYMBALTA) 60 MG capsule Take 1 capsule by mouth daily.  09/19/13  Yes Historical Provider, MD  lisinopril (PRINIVIL,ZESTRIL) 20 MG tablet Take 20 mg by mouth daily.   Yes Historical Provider, MD  metFORMIN (GLUCOPHAGE) 500 MG tablet Take 500 mg by mouth daily.   Yes Historical Provider, MD  metoprolol succinate (TOPROL-XL) 25 MG 24 hr tablet Take 25 mg by mouth daily.   Yes Historical Provider, MD  omeprazole (PRILOSEC) 20 MG capsule Take 20 mg by mouth daily.  01/14/13  Yes Historical Provider, MD     Review of systems complete and found to be negative unless listed above in HPI     Physical exam Blood pressure (!) 155/77, pulse 73, temperature 97.8 F (36.6 C), temperature source Oral, resp. rate 20, height 6' (1.829 m), weight 188 lb 14.4 oz (85.7 kg), SpO2 93 %. General: NAD Neck: No JVD, no thyromegaly or thyroid nodule.  Lungs: Clear to auscultation bilaterally with normal respiratory effort. CV: Nondisplaced PMI. Regular rate and rhythm, normal S1/S2, no S3/S4, no murmur.  No peripheral edema.  Abdomen: Soft, nontender, no hepatosplenomegaly, no distention.  Skin: Intact without lesions or rashes.  Neurologic: Alert and oriented x 3.  Psych: Normal affect. Extremities: No  clubbing or cyanosis.  HEENT: Normal.   ECG: Most recent ECG reviewed.  Labs:   Lab Results  Component Value Date   WBC 22.6 (H) 04/15/2016   HGB 14.1 04/15/2016   HCT 42.4 04/15/2016   MCV 90.4 04/15/2016   PLT 607 (H) 04/15/2016    Recent Labs Lab 04/15/16 0919  NA 138  K 4.2  CL 102  CO2 28  BUN 18  CREATININE 1.73*  CALCIUM 8.7*  PROT 7.2  BILITOT 0.6  ALKPHOS 128*  ALT 27  AST 41  GLUCOSE 98   Lab Results  Component Value Date   TROPONINI <0.03 04/15/2016   No results found for: CHOL No results found for: HDL No results found for: LDLCALC No results found for: TRIG No results found for: CHOLHDL No results found for: LDLDIRECT       Studies: Dg Chest 2 View  Result Date: 04/15/2016 CLINICAL DATA:  Mid and left-sided chest pain began at 3 a.m. today. Unable to raise the left arm due to pain. EXAM: CHEST  2 VIEW COMPARISON:  Portable chest x-ray dated March 31, 2016. FINDINGS: The lungs are mildly hypoinflated but clear. There is no pneumothorax or pleural effusion. The heart and pulmonary vascularity are normal. The mediastinum is normal in width. There is calcification in the wall of the thoracic aorta. The bony thorax exhibits no acute abnormality. IMPRESSION: Mild hypoinflation.  No acute cardiopulmonary abnormality. Aortic atherosclerosis. Electronically Signed   By: David  Martinique M.D.   On: 04/15/2016 10:09   Dg Shoulder Left  Result Date: 04/15/2016 CLINICAL DATA:  Onset of left shoulder pain this morning without known injury EXAM: LEFT SHOULDER - 2+ VIEW COMPARISON:  None in PACs FINDINGS: The bones are subjectively adequately mineralized. The AP view is suboptimally positioned. No acute fracture is observed. The glenohumeral joint space is reasonably well-maintained. The subacromial subdeltoid space is normal. A small spur arises from the inferior tip of the left clavicle. IMPRESSION: No acute bony abnormality of the left shoulder. Small spur arises  from the inferior articular margin of the right clavicle and may be impacting the superior aspect of the rotator cuff. Electronically Signed   By: David  Martinique M.D.   On: 04/15/2016 12:03    ASSESSMENT AND PLAN:  1. Chest pain: No evidence of ACS. Continue ASA and beta blocker. Symptoms are atypical for ischemic heart disease and likely due to referred pain from shoulder. Will have him follow up with Dr. Harl Bowie as previously scheduled on 10/5 to discuss proceeding with coronary angiography.  2. CKD stage 3: Creatinine 1.73-->1.47 today. Due to see nephrology (Dr. Lowanda Foster).  3. HTN: Elevated. Consider addition of amlodipine if it remains persistently elevated.  Dispo: No further recommendations. Will sign off. Can follow up with Dr. Harl Bowie as previously scheduled.   Signed: Kate Sable, M.D., F.A.C.C.  04/15/2016, 5:21 PM

## 2016-04-15 NOTE — ED Triage Notes (Signed)
Pt reports left chest pain radiating to left shoulder and back since last night. Reports diaphoresis, sob. PT alert and oriented.

## 2016-04-16 ENCOUNTER — Ambulatory Visit (HOSPITAL_COMMUNITY): Admission: RE | Admit: 2016-04-16 | Payer: Medicare Other | Source: Ambulatory Visit

## 2016-04-16 DIAGNOSIS — N183 Chronic kidney disease, stage 3 (moderate): Secondary | ICD-10-CM

## 2016-04-16 DIAGNOSIS — R0789 Other chest pain: Secondary | ICD-10-CM

## 2016-04-16 DIAGNOSIS — M25512 Pain in left shoulder: Secondary | ICD-10-CM

## 2016-04-16 DIAGNOSIS — E1122 Type 2 diabetes mellitus with diabetic chronic kidney disease: Secondary | ICD-10-CM | POA: Diagnosis not present

## 2016-04-16 LAB — CBC
HEMATOCRIT: 35.3 % — AB (ref 39.0–52.0)
HEMOGLOBIN: 11.5 g/dL — AB (ref 13.0–17.0)
MCH: 29.3 pg (ref 26.0–34.0)
MCHC: 32.6 g/dL (ref 30.0–36.0)
MCV: 90.1 fL (ref 78.0–100.0)
Platelets: 422 10*3/uL — ABNORMAL HIGH (ref 150–400)
RBC: 3.92 MIL/uL — ABNORMAL LOW (ref 4.22–5.81)
RDW: 22.2 % — AB (ref 11.5–15.5)
WBC: 12.8 10*3/uL — ABNORMAL HIGH (ref 4.0–10.5)

## 2016-04-16 LAB — BASIC METABOLIC PANEL
Anion gap: 5 (ref 5–15)
BUN: 18 mg/dL (ref 6–20)
CALCIUM: 8.1 mg/dL — AB (ref 8.9–10.3)
CHLORIDE: 103 mmol/L (ref 101–111)
CO2: 28 mmol/L (ref 22–32)
CREATININE: 1.47 mg/dL — AB (ref 0.61–1.24)
GFR calc Af Amer: 54 mL/min — ABNORMAL LOW (ref >60)
GFR calc non Af Amer: 47 mL/min — ABNORMAL LOW (ref >60)
GLUCOSE: 134 mg/dL — AB (ref 65–99)
Potassium: 3.9 mmol/L (ref 3.5–5.1)
Sodium: 136 mmol/L (ref 135–145)

## 2016-04-16 LAB — GLUCOSE, CAPILLARY
GLUCOSE-CAPILLARY: 132 mg/dL — AB (ref 65–99)
Glucose-Capillary: 117 mg/dL — ABNORMAL HIGH (ref 65–99)

## 2016-04-16 MED ORDER — MORPHINE SULFATE (PF) 4 MG/ML IV SOLN
4.0000 mg | Freq: Once | INTRAVENOUS | Status: AC
  Administered 2016-04-16: 4 mg via INTRAVENOUS
  Filled 2016-04-16: qty 1

## 2016-04-16 MED ORDER — TRAMADOL HCL 50 MG PO TABS: 50.0000 mg | ORAL_TABLET | Freq: Two times a day (BID) | ORAL | 0 refills | Status: DC | PRN

## 2016-04-16 MED ORDER — TRAMADOL HCL 50 MG PO TABS
50.0000 mg | ORAL_TABLET | Freq: Four times a day (QID) | ORAL | Status: DC | PRN
  Administered 2016-04-16: 50 mg via ORAL
  Filled 2016-04-16: qty 1

## 2016-04-16 NOTE — Care Management Note (Signed)
Case Management Note  Patient Details  Name: [name] MRN: [id number] Date of Birth: 10-14-1945  Subjective/Objective:    Patient adm from home with CP. He is ind with ADL's . He has PCP and insurance, reports no issues.            Action/Plan: Anticipate DC home with self care when appropriate. No CM needs known.  Expected Discharge Date:  04/17/16               Expected Discharge Plan:  Home/Self Care  In-House Referral:  NA  Discharge planning Services  CM Consult  Post Acute Care Choice:  NA Choice offered to:  NA  DME Arranged:    DME Agency:     HH Arranged:    HH Agency:     Status of Service:  Completed, signed off  If discussed at [employer] of Stay Meetings, dates discussed:    Additional Comments:  [name], Alan Reading, RN 04/16/2016, 11:42 AM

## 2016-04-16 NOTE — Progress Notes (Signed)
PROGRESS NOTE  [name]:[id number] DOB: July 02, 1946 DOA: 04/15/2016 PCP: Alan Blitz, MD  Cardiologist: Dr. Harl Webb  Brief Narrative: 70 year old male with history of HTN, DM, stage III chronic kidney disease and abnormal nuclear stress test, presented with left shoulder and chest pain. He was admitted for further evaluation of chest pain.   Assessment/Plan: 1. Atypical chest pain. Musculoskeletal in nature, secondary to bursitis and small rotator cuff tear. Imaging findings appear minor, however his history of severe pain with any movement of his chest/arm, physical exam, limited range of motion movement and history of contralateral rotator cuff in the past with minimal movement all support diagnosis of bursitis and rotator cuff pathology. Troponins were negative. EKG nonacute. Appreciate cardiology evaluation, no further inpatient treatment or workup recommended. 2. Left shoulder/before meals joint pain. MRI showed mild tendinosis of the supraspinatus tendon with a small insertional interstitial tear and mild subacromial/subdeltoid bursitis. Physical exam correlates with these findings with very limited abduction of the shoulder secondary to pain and exquisite pain around the acromion. 3.  CKD stage III. Creatinine appears to be at baseline. He was scheduled to follow-up with nephrology but did not due to hospitalization. Renal function appears to be stable over the last several years. 4. DM type II, stable 5. Leukocytosis, thrombocytosis. Afebrile, no evidence of infection. Unclear etiology. Seen by Dr. Irene Webb 9/20, undergoing workup for possible myeloproliferative disorder 6. GERD. Continue PPI.  7. Aortic atherosclerosis seen on chest x-ray.   Overall appears stable. There is no evidence of severe pathology at this point. As above suspected musculoskeletal. Recommend pain control, Tylenol, movement as tolerated. Suggest outpatient follow-up with his orthopedic surgeon Dr. French Webb.  Patient reports he cannot tolerate steroids. I have recommended short course of ibuprofen to be limited to-3 days given his chronic kidney disease.  Summary of records reviewed: Office note 9/21 Dr. Harl Webb: Followed up for chest pain with notation of recent abnormal nuclear stress test and multiple coronary artery disease risk factors. Catheterization was discussed but patient was hesitant.  DVT prophylaxis: Heparin  Code Status: Full  Family Communication: Wife bedside Disposition Plan: Discharge home once improved.   Alan Hodgkins, MD  [name] Direct contact: [phone] --Via amion app OR  --[email or web address]; password [email or web address]  7PM-7AM contact night coverage as above 04/16/2016, 6:04 AM  LOS: 0 days   Consultants:  Cardiology   Procedures:  None   Antimicrobials:  None   CC: Follow-up chest pain   Interval history/Subjective: Feels poorly. Has constant pain in his chest and left shoulder. Any movement aggravates this pain and he finds it difficult to sit up. Impossible to abduct his left shoulder without severe pain, unable to touch the top of his head with his left hand were reached ceiling secondary to excruciating pain. Reports tearing his right rotator cuff in the past with minimal activity. No previous history of blood clots or recent travel.  ROS: No nausea or vomiting   Objective: Vitals:   04/15/16 1527 04/15/16 1602 04/15/16 2126 04/16/16 0514  BP: 156/80 (!) 155/77 (!) 170/81 (!) 174/73  Pulse: 73 73 77 64  Resp: 18 20 20 17   Temp: 97.8 F (36.6 C) 97.8 F (36.6 C) 98.3 F (36.8 C) 98.1 F (36.7 C)  TempSrc: Oral Oral Oral Oral  SpO2: 95% 93% 97% 95%  Weight:  85.7 kg (188 lb 14.4 oz)    Height:  6' (1.829 m)     No intake or output data in the 24 hours ending  04/16/16 0604   Filed Weights   04/15/16 0907 04/15/16 1602  Weight: 89.4 kg (197 lb) 85.7 kg (188 lb 14.4 oz)    Exam:  Constitutional:  . Appears calm, Mildly  uncomfortable Respiratory:  . CTA bilaterally, no w/r/r.  . Respiratory effort normal. No retractions or accessory muscle use Cardiovascular:  . RRR, no m/r/g . No LE extremity edema    Musculoskeletal. Pain with palpation of the left chest wall, upper sternum. Exquisite pain with palpation below the acromioclavicular joint and superior towards the shoulder pain. Movement at the elbow to hand is normal. Unable to abduct the shoulder secondary to pain or reach for the ceiling. Radial pulse is 2+ in the left hand with apparent normal perfusion.  I have personally reviewed following labs and imaging studies:  Cr 1.47, at baseline   Troponin negative  MRI shoulder noted  Scheduled Meds: . aspirin EC  81 mg Oral Daily  . DULoxetine  60 mg Oral Daily  . heparin  5,000 Units Subcutaneous Q8H  . insulin aspart  0-15 Units Subcutaneous TID WC  . insulin aspart  0-5 Units Subcutaneous QHS  . metoprolol succinate  25 mg Oral Daily  . pantoprazole  40 mg Oral Daily  . sodium chloride flush  3 mL Intravenous Q12H   Continuous Infusions: . sodium chloride 100 mL/hr at 04/15/16 1600    Active Problems:   GERD (gastroesophageal reflux disease)   CAD (coronary artery disease)   Leukocytosis   Diabetes mellitus (HCC)   Chest pain   CKD (chronic kidney disease) stage 3, GFR 30-59 ml/min   Left shoulder pain   LOS: 0 days    By signing my name below, I, [name], attest that this documentation has been prepared under the direction and in the presence of Alan Hodgkins, MD. Electronically signed: Collene Webb, Scribe. 04/16/16 1:20 PM   I personally performed the services described in this documentation. All medical record entries made by the scribe were at my direction. I have reviewed the chart and agree that the record reflects my personal performance and is accurate and complete. Alan Hodgkins, MD

## 2016-04-16 NOTE — Care Management Obs Status (Signed)
[facility] NOTIFICATION   Patient Details  Name: [name] MRN: [id number] Date of Birth: [birth year]   Medicare Observation Status Notification Given:  Yes    Imonie Tuch, Chauncey Reading, RN 04/16/2016, 11:44 AM

## 2016-04-16 NOTE — Discharge Summary (Signed)
Physician Discharge Summary  [name] UUV:[id number] DOB: 1946-01-14 DOA: 04/15/2016  PCP: [name], MD  Admit date: 04/15/2016 Discharge date: 04/16/2016  Recommendations for Outpatient Follow-up:  1. Follow-up with Dr. French Ana 1-2 weeks if pain does not improve 2. Keep follow-up appointment with Dr. Harl Bowie 10/5   Follow-up Information    [name], MD .   Specialty:  Internal Medicine Why:  as needed Contact information: [place] [address] [phone]          Discharge Diagnoses:  1. Atypical chest pain  2. Left shoulder pain, tendinosis of the supraspinatus tendon with small insertional interstitial tear, mild subacromial/subdeltoid bursitis 3. CKD stage III 4. Diabetes mellitus type 2 5. Aortic atherosclerosis  Discharge Condition: Stable  Disposition: Home   Diet recommendation: Carb modified , heart healthy  Filed Weights   04/15/16 0907 04/15/16 1602  Weight: 89.4 kg (197 lb) 85.7 kg (188 lb 14.4 oz)    History of present illness:  70 year old man with history of HTN, DM, stage III chronic kidney disease and abnormal nuclear stress test, presented with left shoulder and chest pain. He was admitted for further evaluation of atypical chest pain.   Hospital Course:  Observed overnight. No change in pain. Evaluated by cardiology, felt to be atypical in nature, referred pain from shoulder. Troponin negative. EKG nonacute. Cardiology recommended keeping outpatient follow-up, no further inpatient testing. As noted below, the patient's history, exam findings and radiologic findings all support a diagnosis of musculoskeletal referred pain secondary to rotator cuff pathology and bursitis.  1. Atypical chest pain. Musculoskeletal in nature, secondary to bursitis and small rotator cuff tear. Imaging findings appear minor, however his history of severe pain with any movement of his chest/arm, physical exam, limited range of motion movement and history  of contralateral rotator cuff in the past with minimal movement all support diagnosis of bursitis and rotator cuff pathology. Troponins were negative. EKG nonacute. Appreciate cardiology evaluation, no further inpatient treatment or workup recommended. 2. Left shoulder pain. MRI showed mild tendinosis of the supraspinatus tendon with a small insertional interstitial tear and mild subacromial/subdeltoid bursitis. Physical exam correlates with these findings with very limited abduction of the shoulder secondary to pain and exquisite pain around the acromion. 3.  CKD stage III. Creatinine appears to be at baseline. He was scheduled to follow-up with nephrology but did not due to hospitalization. Renal function appears to be stable over the last several years. 4. DM type II, stable 5. Leukocytosis, thrombocytosis. Afebrile, no evidence of infection. Unclear etiology. Seen by Dr. Irene Limbo 9/20, undergoing workup for possible myeloproliferative disorder 6. GERD. Continue PPI.  7. Aortic atherosclerosis seen on chest x-ray.   Overall appears stable. There is no evidence of severe pathology at this point. As above suspected musculoskeletal. Recommend pain control, Tylenol, movement as tolerated. Suggest outpatient follow-up with his orthopedic surgeon Dr. French Ana. Patient reports he cannot tolerate steroids. I have recommended short course of ibuprofen to be limited 2-3 days given his chronic kidney disease.   Discharge Instructions  Discharge Instructions    Diet - low sodium heart healthy    Complete by:  As directed    Discharge instructions    Complete by:  As directed    Call your physician or seek immediate medical attention for increased pain, shortness of breath, chest pain, arm pain, weakness, numbness or worsening of condition. Recommend follow-up with your orthopedic doctor in 1-2 weeks and keep follow-up appointment with Dr. Harl Bowie 10/5.  Increase activity slowly    Complete by:  As directed          Medication List    TAKE these medications   acetaminophen 500 MG tablet Commonly known as:  TYLENOL Take 500 mg by mouth every 6 (six) hours as needed for mild pain or moderate pain.   aspirin EC 81 MG tablet Take 81 mg by mouth daily.   cyclobenzaprine 5 MG tablet Commonly known as:  FLEXERIL Take 1 tablet (5 mg total) by mouth 3 (three) times daily as needed for muscle spasms.   DULoxetine 60 MG capsule Commonly known as:  CYMBALTA Take 1 capsule by mouth daily.   lisinopril 20 MG tablet Commonly known as:  PRINIVIL,ZESTRIL Take 20 mg by mouth daily.   metFORMIN 500 MG tablet Commonly known as:  GLUCOPHAGE Take 500 mg by mouth daily.   metoprolol succinate 25 MG 24 hr tablet Commonly known as:  TOPROL-XL Take 25 mg by mouth daily.   omeprazole 20 MG capsule Commonly known as:  PRILOSEC Take 20 mg by mouth daily.   traMADol 50 MG tablet Commonly known as:  ULTRAM Take 1 tablet (50 mg total) by mouth every 12 (twelve) hours as needed for moderate pain.      Allergies  Allergen Reactions  . Hydrocodone Itching    The results of significant diagnostics from this hospitalization (including imaging, microbiology, ancillary and laboratory) are listed below for reference.    Significant Diagnostic Studies: Dg Chest 2 View  Result Date: 04/15/2016 CLINICAL DATA:  Mid and left-sided chest pain began at 3 a.m. today. Unable to raise the left arm due to pain. EXAM: CHEST  2 VIEW COMPARISON:  Portable chest x-ray dated March 31, 2016. FINDINGS: The lungs are mildly hypoinflated but clear. There is no pneumothorax or pleural effusion. The heart and pulmonary vascularity are normal. The mediastinum is normal in width. There is calcification in the wall of the thoracic aorta. The bony thorax exhibits no acute abnormality. IMPRESSION: Mild hypoinflation.  No acute cardiopulmonary abnormality. Aortic atherosclerosis. Electronically Signed   By: David  Martinique M.D.   On:  04/15/2016 10:09   Mr Shoulder Left Wo Contrast  Result Date: 04/16/2016 CLINICAL DATA:  Left shoulder pain without a injury. EXAM: MRI OF THE LEFT SHOULDER WITHOUT CONTRAST TECHNIQUE: Multiplanar, multisequence MR imaging of the shoulder was performed. No intravenous contrast was administered. COMPARISON:  None. FINDINGS: Rotator cuff: Mild tendinosis of the supraspinatus tendon with a small insertional interstitial tear. Infraspinatus tendon is intact. Teres minor tendon is intact. Subscapularis tendon is intact. Muscles: No atrophy or fatty replacement of nor abnormal signal within, the muscles of the rotator cuff. Biceps long head:  Intact. Acromioclavicular Joint: Mild arthropathy of the acromioclavicular joint. Type I acromion. Trace amount of subacromial/ subdeltoid bursal fluid. Glenohumeral Joint: No joint effusion.  Mild chondromalacia. Labrum: Limited evaluation secondary lack of intra-articular contrast. Small posterior labral tear at the labroligamentous junction. Bones: Mild subcortical reactive marrow changes in the lesser tuberosity. No other marrow signal abnormality. No fracture or dislocation. Other: No fluid collection or hematoma. IMPRESSION: 1. Mild tendinosis of the supraspinatus tendon with a small insertional interstitial tear. 2. Mild subacromial/subdeltoid bursitis. Electronically Signed   By: Kathreen Devoid   On: 04/16/2016 08:08   Dg Shoulder Left  Result Date: 04/15/2016 CLINICAL DATA:  Onset of left shoulder pain this morning without known injury EXAM: LEFT SHOULDER - 2+ VIEW COMPARISON:  None in PACs FINDINGS: The bones are subjectively  adequately mineralized. The AP view is suboptimally positioned. No acute fracture is observed. The glenohumeral joint space is reasonably well-maintained. The subacromial subdeltoid space is normal. A small spur arises from the inferior tip of the left clavicle. IMPRESSION: No acute bony abnormality of the left shoulder. Small spur arises from  the inferior articular margin of the right clavicle and may be impacting the superior aspect of the rotator cuff. Electronically Signed   By: David  Martinique M.D.   On: 04/15/2016 12:03    Microbiology: Recent Results (from the past 240 hour(s))  Culture, blood (routine x 2)     Status: None (Preliminary result)   Collection Time: 04/15/16  4:03 PM  Result Value Ref Range Status   Specimen Description BLOOD LEFT ARM  Final   Special Requests BOTTLES DRAWN AEROBIC AND ANAEROBIC 8CC  Final   Culture NO GROWTH < 24 HOURS  Final   Report Status PENDING  Incomplete  Culture, blood (routine x 2)     Status: None (Preliminary result)   Collection Time: 04/15/16  4:12 PM  Result Value Ref Range Status   Specimen Description BLOOD LEFT HAND  Final   Special Requests BOTTLES DRAWN AEROBIC AND ANAEROBIC 6CC  Final   Culture NO GROWTH < 24 HOURS  Final   Report Status PENDING  Incomplete     Labs: Basic Metabolic Panel:  Recent Labs Lab 04/15/16 0919 04/16/16 0613  NA 138 136  K 4.2 3.9  CL 102 103  CO2 28 28  GLUCOSE 98 134*  BUN 18 18  CREATININE 1.73* 1.47*  CALCIUM 8.7* 8.1*   Liver Function Tests:  Recent Labs Lab 04/15/16 0919  AST 41  ALT 27  ALKPHOS 128*  BILITOT 0.6  PROT 7.2  ALBUMIN 3.9   CBC:  Recent Labs Lab 04/15/16 0919 04/16/16 0613  WBC 22.6* 12.8*  HGB 14.1 11.5*  HCT 42.4 35.3*  MCV 90.4 90.1  PLT 607* 422*   Cardiac Enzymes:  Recent Labs Lab 04/15/16 0919  TROPONINI <0.03    CBG:  Recent Labs Lab 04/15/16 1622 04/15/16 2124 04/16/16 0744 04/16/16 1143  GLUCAP 179* 121* 117* 132*    Active Problems:   GERD (gastroesophageal reflux disease)   Coronary artery disease due to lipid rich plaque   Leukocytosis   Diabetes mellitus (HCC)   Chest pain   CKD (chronic kidney disease) stage 3, GFR 30-59 ml/min   Left shoulder pain   Time coordinating discharge: 35 minutes   Signed:  [name], MD [facility] 04/16/2016, 3:51 PM  By signing my name below, I, Collene Leyden, attest that this documentation has been prepared under the direction and in the presence of [name], MD. Electronically signed: Collene Leyden, Scribe. 04/16/16 1:20 PM

## 2016-04-16 NOTE — Progress Notes (Signed)
Patient states understanding of discharge instructions, prescription given. 

## 2016-04-16 NOTE — Progress Notes (Signed)
Nutrition Brief Note  Patient identified on the Malnutrition Screening Tool (MST) Report  Wt Readings from Last 15 Encounters:  04/15/16 188 lb 14.4 oz (85.7 kg)  04/10/16 197 lb (89.4 kg)  04/09/16 197 lb 8 oz (89.6 kg)  08/08/15 209 lb (94.8 kg)  05/10/15 212 lb (96.2 kg)  09/25/13 214 lb (97.1 kg)  09/24/13 214 lb (97.1 kg)  02/01/13 201 lb (91.2 kg)  01/25/13 210 lb (95.3 kg)  01/18/13 208 lb 6.4 oz (94.5 kg)    Body mass index is 25.62 kg/m. Patient meets criteria for overweight  based on current BMI.   Pt was slightly agitated and largely did not want to speak to RD or elaborate on why he has lost weight. All he said was "if Im not hungry, I dont eat". He could not identify how long this has been going on.  He wasn't interested in any interventions. He says he is to leave soon. He uses salt frequently though he knows this isnt the healthiest. He says "Im preached day in and day out about my diet..I'm still alive arent I".  Current diet order is [place], patient is consuming approximately 75% of meals at this time. Labs and medications reviewed.   No nutrition interventions warranted at this time. If nutrition issues arise, please consult RD.   Burtis Junes RD, LDN, CNSC Clinical Nutrition Pager: [phone] 04/16/2016 12:13 PM

## 2016-04-20 LAB — CULTURE, BLOOD (ROUTINE X 2)
CULTURE: NO GROWTH
Culture: NO GROWTH

## 2016-04-23 ENCOUNTER — Encounter (HOSPITAL_COMMUNITY): Payer: Medicare Other | Attending: Hematology | Admitting: Hematology

## 2016-04-23 ENCOUNTER — Encounter (HOSPITAL_COMMUNITY): Payer: Self-pay | Admitting: Hematology

## 2016-04-23 ENCOUNTER — Encounter (HOSPITAL_COMMUNITY): Payer: Medicare Other

## 2016-04-23 ENCOUNTER — Ambulatory Visit (HOSPITAL_COMMUNITY)
Admission: RE | Admit: 2016-04-23 | Discharge: 2016-04-23 | Disposition: A | Discharge status: Home / Self Care | Payer: Medicare Other | Source: Ambulatory Visit | Attending: Hematology | Admitting: Hematology

## 2016-04-23 ENCOUNTER — Ambulatory Visit: Payer: Medicare Other | Admitting: Cardiology

## 2016-04-23 VITALS — BP 133/70 | HR 72 | Temp 98.1°F | Resp 18

## 2016-04-23 DIAGNOSIS — R222 Localized swelling, mass and lump, trunk: Secondary | ICD-10-CM

## 2016-04-23 DIAGNOSIS — D471 Chronic myeloproliferative disease: Secondary | ICD-10-CM

## 2016-04-23 DIAGNOSIS — D473 Essential (hemorrhagic) thrombocythemia: Secondary | ICD-10-CM | POA: Diagnosis not present

## 2016-04-23 DIAGNOSIS — I129 Hypertensive chronic kidney disease with stage 1 through stage 4 chronic kidney disease, or unspecified chronic kidney disease: Secondary | ICD-10-CM | POA: Insufficient documentation

## 2016-04-23 DIAGNOSIS — I700 Atherosclerosis of aorta: Secondary | ICD-10-CM

## 2016-04-23 DIAGNOSIS — I2510 Atherosclerotic heart disease of native coronary artery without angina pectoris: Secondary | ICD-10-CM | POA: Insufficient documentation

## 2016-04-23 DIAGNOSIS — D72824 Basophilia: Secondary | ICD-10-CM

## 2016-04-23 DIAGNOSIS — R161 Splenomegaly, not elsewhere classified: Secondary | ICD-10-CM | POA: Insufficient documentation

## 2016-04-23 DIAGNOSIS — R1010 Upper abdominal pain, unspecified: Secondary | ICD-10-CM

## 2016-04-23 DIAGNOSIS — D72829 Elevated white blood cell count, unspecified: Secondary | ICD-10-CM | POA: Diagnosis not present

## 2016-04-23 DIAGNOSIS — R1900 Intra-abdominal and pelvic swelling, mass and lump, unspecified site: Secondary | ICD-10-CM | POA: Diagnosis not present

## 2016-04-23 DIAGNOSIS — Z7984 Long term (current) use of oral hypoglycemic drugs: Secondary | ICD-10-CM | POA: Insufficient documentation

## 2016-04-23 DIAGNOSIS — F419 Anxiety disorder, unspecified: Secondary | ICD-10-CM | POA: Insufficient documentation

## 2016-04-23 DIAGNOSIS — R740 Nonspecific elevation of levels of transaminase and lactic acid dehydrogenase [LDH]: Secondary | ICD-10-CM

## 2016-04-23 DIAGNOSIS — R0789 Other chest pain: Secondary | ICD-10-CM | POA: Insufficient documentation

## 2016-04-23 DIAGNOSIS — D696 Thrombocytopenia, unspecified: Secondary | ICD-10-CM | POA: Insufficient documentation

## 2016-04-23 DIAGNOSIS — E785 Hyperlipidemia, unspecified: Secondary | ICD-10-CM | POA: Insufficient documentation

## 2016-04-23 DIAGNOSIS — Z7982 Long term (current) use of aspirin: Secondary | ICD-10-CM | POA: Insufficient documentation

## 2016-04-23 DIAGNOSIS — K219 Gastro-esophageal reflux disease without esophagitis: Secondary | ICD-10-CM | POA: Insufficient documentation

## 2016-04-23 DIAGNOSIS — Z87891 Personal history of nicotine dependence: Secondary | ICD-10-CM | POA: Insufficient documentation

## 2016-04-23 DIAGNOSIS — N189 Chronic kidney disease, unspecified: Secondary | ICD-10-CM | POA: Insufficient documentation

## 2016-04-23 DIAGNOSIS — E1122 Type 2 diabetes mellitus with diabetic chronic kidney disease: Secondary | ICD-10-CM | POA: Insufficient documentation

## 2016-04-23 LAB — LACTATE DEHYDROGENASE: LDH: 1058 U/L — AB (ref 98–192)

## 2016-04-23 LAB — COMPREHENSIVE METABOLIC PANEL
ALT: 25 U/L (ref 17–63)
AST: 38 U/L (ref 15–41)
Albumin: 3.5 g/dL (ref 3.5–5.0)
Alkaline Phosphatase: 133 U/L — ABNORMAL HIGH (ref 38–126)
Anion gap: 6 (ref 5–15)
BILIRUBIN TOTAL: 0.7 mg/dL (ref 0.3–1.2)
BUN: 16 mg/dL (ref 6–20)
CHLORIDE: 103 mmol/L (ref 101–111)
CO2: 27 mmol/L (ref 22–32)
CREATININE: 1.61 mg/dL — AB (ref 0.61–1.24)
Calcium: 8.8 mg/dL — ABNORMAL LOW (ref 8.9–10.3)
GFR calc Af Amer: 48 mL/min — ABNORMAL LOW (ref >60)
GFR, EST NON AFRICAN AMERICAN: 42 mL/min — AB (ref >60)
GLUCOSE: 111 mg/dL — AB (ref 65–99)
Potassium: 4.7 mmol/L (ref 3.5–5.1)
Sodium: 136 mmol/L (ref 135–145)
Total Protein: 6.7 g/dL (ref 6.5–8.1)

## 2016-04-23 LAB — CBC WITH DIFFERENTIAL/PLATELET
BAND NEUTROPHILS: 19 %
Basophils Relative: 2 %
Blasts: 9 %
EOS PCT: 1 %
HEMATOCRIT: 38.1 % — AB (ref 39.0–52.0)
Hemoglobin: 12.2 g/dL — ABNORMAL LOW (ref 13.0–17.0)
Lymphocytes Relative: 9 %
MCH: 29 pg (ref 26.0–34.0)
MCHC: 32 g/dL (ref 30.0–36.0)
MCV: 90.7 fL (ref 78.0–100.0)
METAMYELOCYTES PCT: 14 %
MONOS PCT: 3 %
Myelocytes: 17 %
Neutrophils Relative %: 16 %
PLATELETS: 648 10*3/uL — AB (ref 150–400)
Promyelocytes Absolute: 10 %
RBC: 4.2 MIL/uL — ABNORMAL LOW (ref 4.22–5.81)
RDW: 22.6 % — ABNORMAL HIGH (ref 11.5–15.5)
SMEAR REVIEW: INCREASED
WBC: 17.4 10*3/uL — ABNORMAL HIGH (ref 4.0–10.5)
nRBC: 0 /100 WBC

## 2016-04-23 LAB — URIC ACID: Uric Acid, Serum: 7 mg/dL (ref 4.4–7.6)

## 2016-04-23 MED ORDER — OXYCODONE-ACETAMINOPHEN 5-325 MG PO TABS: 1.0000 | ORAL_TABLET | ORAL | 0 refills | Status: DC | PRN

## 2016-04-23 MED ORDER — OXYCODONE-ACETAMINOPHEN 5-325 MG PO TABS
2.0000 | ORAL_TABLET | Freq: Once | ORAL | Status: AC
  Administered 2016-04-23: 2 via ORAL
  Filled 2016-04-23: qty 2

## 2016-04-23 MED ORDER — IOPAMIDOL (ISOVUE-300) INJECTION 61%
INTRAVENOUS | Status: AC
  Filled 2016-04-23: qty 30

## 2016-04-23 NOTE — Patient Instructions (Addendum)
[place] at [facility]  Discharge Instructions:  Seen by MD Irene Limbo today.  Labs today.   Bone Marrow biopsy [place] [place])  CT ab/pelvis.   _______________________________________________________________  Thank you for choosing [facility] at [facility] to provide your oncology and hematology care.  To afford each patient quality time with our providers, please arrive at least 15 minutes before your scheduled appointment.  You need to re-schedule your appointment if you arrive 10 or more minutes late.  We strive to give you quality time with our providers, and arriving late affects you and other patients whose appointments are after yours.  Also, if you no show three or more times for appointments you may be dismissed from the clinic.  Again, thank you for choosing [place] at [facility] hope is that these requests will allow you access to exceptional care and in a timely manner. _______________________________________________________________  If you have questions after your visit, please contact our office at [phone] between the hours of 8:30 a.m. and 5:00 p.m. Voicemails left after 4:30 p.m. will not be returned until the following business day. _______________________________________________________________  For prescription refill requests, have your pharmacy contact our office. _______________________________________________________________  Recommendations made by the consultant and any test results will be sent to your referring physician. _______________________________________________________________

## 2016-04-24 ENCOUNTER — Ambulatory Visit (INDEPENDENT_AMBULATORY_CARE_PROVIDER_SITE_OTHER): Payer: Medicare Other | Admitting: Adult Health

## 2016-04-24 ENCOUNTER — Other Ambulatory Visit: Payer: Self-pay | Admitting: Radiology

## 2016-04-24 ENCOUNTER — Other Ambulatory Visit: Payer: Self-pay | Admitting: General Surgery

## 2016-04-24 ENCOUNTER — Encounter: Payer: Self-pay | Admitting: Adult Health

## 2016-04-24 VITALS — BP 138/74 | HR 67 | Ht 72.0 in | Wt 197.0 lb

## 2016-04-24 DIAGNOSIS — D473 Essential (hemorrhagic) thrombocythemia: Secondary | ICD-10-CM

## 2016-04-24 DIAGNOSIS — I2510 Atherosclerotic heart disease of native coronary artery without angina pectoris: Secondary | ICD-10-CM | POA: Diagnosis not present

## 2016-04-24 DIAGNOSIS — R9439 Abnormal result of other cardiovascular function study: Secondary | ICD-10-CM

## 2016-04-24 DIAGNOSIS — D75839 Thrombocytosis, unspecified: Secondary | ICD-10-CM

## 2016-04-24 NOTE — Progress Notes (Signed)
Cardiology Office Note   Date:  04/24/2016   ID:  MELROY BOUGHER, DOB Jun 09, 1946, MRN [id number]  PCP:  [name], MD  Cardiologist: Cloria Spring, NP   No chief complaint on file.     History of Present Illness: [name] is a 70 y.o. male who presents for ongoing assessment and management of chest pain. The patient had abnormal stress test with coronary artery risk factors of hypertension diabetes tobacco abuse and family history. He was seen by Dr. Bronson Ing at [facility] and ruled out for ACS. He was continued on aspirin and beta blocker. Symptoms were found to be atypical for ischemic heart disease and likely due to referred pain from shoulder. Patient has chronic kidney disease with a creatinine of 1.73-1.47. Her  The patient comes today with multiple somatic complaints. He apparently tore his left rotator cuff and has a large hematoma on the posterior portion of the left side of his back under the scapula. He is having a great deal of pain as a result. He has also had recent lab work that shows significant thrombocytosis (platelets 648), along with leukocytosis (white blood cells 17.4). Chemistries have also been reviewed with a creatinine of 1.61.  The patient is due to have a bone marrow biopsy tomorrow. Once this is completed he will know more about his hematology abnormalities. He does wish to proceed with catheterization and I explained to him that he will need to be hospitalized tonight before for hydration. He wishes to hold off on catheterization until after bone marrow biopsy results to make a plan. Recent CT scan did show some coronary atherosclerosis. It also showed a nodule in his lung.  CT Chest  IMPRESSION: Heterogeneous crescentic soft tissue abnormality deep to the left trapezius muscle with Hounsfield units suggestive of hematoma with internal heterogeneity suggesting areas of probable clot. This measures approximately 10 x 3 x 8.3 cm.  Follow up may prove useful to ensure stability and/or resolution as this may mask other underlying pathology.  Past Medical History:  Diagnosis Date  . Anxiety   . Chronic back pain   . Diabetes mellitus   . Gastritis   . GERD (gastroesophageal reflux disease)   . Hypertension   . Renal insufficiency     Past Surgical History:  Procedure Laterality Date  . BACK SURGERY    . COLONOSCOPY WITH PROPOFOL N/A 02/01/2013   SLF: 1. 23 colon polyps removed. 2 retrieved. 2. Mild diverticulosis in teh sigmoid colon 3. Small internal hemorrhoids 4. The colon is redundant   . ESOPHAGOGASTRODUODENOSCOPY (EGD) WITH PROPOFOL N/A 02/01/2013   SLF: 1. Moderate erosive gastritis  . POLYPECTOMY N/A 02/01/2013   Procedure: POLYPECTOMY;  Surgeon: Danie Binder, MD;  Location: [facility];  Service: Endoscopy;  Laterality: N/A;     Current Outpatient Prescriptions  Medication Sig Dispense Refill  . acetaminophen (TYLENOL) 500 MG tablet Take 500 mg by mouth every 6 (six) hours as needed for mild pain or moderate pain.    Marland Kitchen aspirin EC 81 MG tablet Take 81 mg by mouth daily.    . cyclobenzaprine (FLEXERIL) 5 MG tablet Take 1 tablet (5 mg total) by mouth 3 (three) times daily as needed for muscle spasms. 20 tablet 0  . DULoxetine (CYMBALTA) 60 MG capsule Take 2 capsules by mouth daily.     Marland Kitchen lisinopril (PRINIVIL,ZESTRIL) 20 MG tablet Take 20 mg by mouth daily.    . metFORMIN (GLUCOPHAGE) 500 MG tablet Take  500 mg by mouth daily.    . metoprolol succinate (TOPROL-XL) 25 MG 24 hr tablet Take 25 mg by mouth daily.    Marland Kitchen omeprazole (PRILOSEC) 20 MG capsule Take 20 mg by mouth 2 (two) times daily.     Marland Kitchen oxyCODONE-acetaminophen (PERCOCET/ROXICET) 5-325 MG tablet Take 1-2 tablets by mouth every 4 (four) hours as needed for severe pain. 40 tablet 0  . traMADol (ULTRAM) 50 MG tablet Take 1 tablet (50 mg total) by mouth every 12 (twelve) hours as needed for moderate pain. 10 tablet 0   No current facility-administered  medications for this visit.     Allergies:   Hydrocodone    Social History:  The patient  reports that he quit smoking about 2 years ago. His smoking use included Cigars. He has a 20.00 pack-year smoking history. He has never used smokeless tobacco. He reports that he does not drink alcohol or use drugs.   Family History:  The patient's family history is not on file.    ROS: All other systems are reviewed and negative. Unless otherwise mentioned in H&P    PHYSICAL EXAM: VS:  BP 138/74   Pulse 67   Ht 6' (1.829 m)   Wt 197 lb (89.4 kg)   SpO2 97%   BMI 26.72 kg/m  , BMI Body mass index is 26.72 kg/m. GEN: Well nourished, well developed, in no acute distress  HEENT: normal  Neck: no JVD, carotid bruits, or masses Cardiac: RRR; no murmurs, rubs, or gallops,no edema  Respiratory:  clear to auscultation bilaterally, normal work of breathing GI: soft, nontender, nondistended, + BS MS: no deformity or atrophy  Skin: warm and dry, no rash Neuro:  Strength and sensation are intact Psych: euthymic mood, full affect   Recent Labs: 04/23/2016: ALT 25; BUN 16; Creatinine, Ser 1.61; Hemoglobin 12.2; Platelets 648; Potassium 4.7; Sodium 136    Lipid Panel No results found for: CHOL, TRIG, HDL, CHOLHDL, VLDL, LDLCALC, LDLDIRECT    Wt Readings from Last 3 Encounters:  04/24/16 197 lb (89.4 kg)  04/15/16 188 lb 14.4 oz (85.7 kg)  04/10/16 197 lb (89.4 kg)      ASSESSMENT AND PLAN:  1.  Chest pain: . He reportedly had an abnormal stress test at [facility]. I do not have access to that scanned report at this time. He has multiple cardiovascular risk factors along with atherosclerosis seen during CT scan evaluation. He is willing to proceed with cardiac catheterization but wishes to wait until after bone marrow biopsy. He will follow-up with Dr. Harl Bowie in the next couple of weeks to discuss this further. He is aware that he may need to be admitted for hydration prior to the  catheterization due to chronic kidney disease  2: Thrombocytosis: He has had a bone marrow study completed tomorrow for myeloproliferative disease. He wishes to wait for catheterization until this is completed and results are discussed.  3. Torn rotator cuff with hematoma: Patient is to follow-up with orthopedic surgeon and is to wear his sling. He has not been wearing it. I've asked him to do so to help with support until he follows up.  4. Chronic kidney disease: Creatinine is elevated at 1.6. He is to follow-up with nephrology.  Current medicines are reviewed at length with the patient today.   I've also spent greater than 25 minutes going over lab results CT scans and discussing catheterization with the patient and his wife. I've given them copies of the lab work  and CT scan. Multiple explanations of his medications and procedures along with explanations of lab results.  Labs/ tests ordered today include: No orders of the defined types were placed in this encounter.    Disposition:   FU with Dr. Harl Bowie   Signed, Jory Sims, NP  04/24/2016 4:56 PM    [place] [address], [place], [place] [address] Phone: [phone]; Fax: [phone]

## 2016-04-24 NOTE — Patient Instructions (Signed)
Your physician recommends that you schedule a follow-up appointment with Dr. Harl Bowie  Your physician recommends that you continue on your current medications as directed. Please refer to the Current Medication list given to you today.  If you need a refill on your cardiac medications before your next appointment, please call your pharmacy.  Thank you for choosing [facility]!

## 2016-04-24 NOTE — Progress Notes (Signed)
Name: [name]    DOB: 21-Jan-1946  Age: 70 y.o.  MR#: [id number]       PCP:  [name], MD      Insurance: Payor: MEDICARE / Plan: MEDICARE PART A AND B / Product Type: *No Product type* /   CC:   No chief complaint on file.   VS Vitals:   04/24/16 1457  Weight: 197 lb (89.4 kg)  Height: 6' (1.829 m)    Weights Current Weight  04/24/16 197 lb (89.4 kg)  04/15/16 188 lb 14.4 oz (85.7 kg)  04/10/16 197 lb (89.4 kg)    Blood Pressure  BP Readings from Last 3 Encounters:  04/23/16 133/70  04/16/16 (!) 170/72  04/10/16 (!) 144/64     Admit date:  (Not on file) Last encounter with RMR:  Visit date not found   Allergy Hydrocodone  Current Outpatient Prescriptions  Medication Sig Dispense Refill  . acetaminophen (TYLENOL) 500 MG tablet Take 500 mg by mouth every 6 (six) hours as needed for mild pain or moderate pain.    Marland Kitchen aspirin EC 81 MG tablet Take 81 mg by mouth daily.    . cyclobenzaprine (FLEXERIL) 5 MG tablet Take 1 tablet (5 mg total) by mouth 3 (three) times daily as needed for muscle spasms. 20 tablet 0  . DULoxetine (CYMBALTA) 60 MG capsule Take 2 capsules by mouth daily.     Marland Kitchen lisinopril (PRINIVIL,ZESTRIL) 20 MG tablet Take 20 mg by mouth daily.    . metFORMIN (GLUCOPHAGE) 500 MG tablet Take 500 mg by mouth daily.    . metoprolol succinate (TOPROL-XL) 25 MG 24 hr tablet Take 25 mg by mouth daily.    Marland Kitchen omeprazole (PRILOSEC) 20 MG capsule Take 20 mg by mouth 2 (two) times daily.     Marland Kitchen oxyCODONE-acetaminophen (PERCOCET/ROXICET) 5-325 MG tablet Take 1-2 tablets by mouth every 4 (four) hours as needed for severe pain. 40 tablet 0  . traMADol (ULTRAM) 50 MG tablet Take 1 tablet (50 mg total) by mouth every 12 (twelve) hours as needed for moderate pain. 10 tablet 0   No current facility-administered medications for this visit.     Discontinued Meds:   There are no discontinued medications.  Patient Active Problem List   Diagnosis Date Noted  . Coronary artery  disease due to lipid rich plaque 04/15/2016  . Leukocytosis 04/15/2016  . Diabetes mellitus ([place]) 04/15/2016  . Chest pain 04/15/2016  . CKD (chronic kidney disease) stage 3, GFR 30-59 ml/min 04/15/2016  . Left shoulder pain 04/15/2016  . Encounter for screening colonoscopy 01/21/2013  . GERD (gastroesophageal reflux disease) 01/21/2013    LABS    Component Value Date/Time   NA 136 04/23/2016 1154   NA 136 04/16/2016 0613   NA 138 04/15/2016 0919   NA 137 11/04/2012   K 4.7 04/23/2016 1154   K 3.9 04/16/2016 0613   K 4.2 04/15/2016 0919   K 3.9 11/04/2012   CL 103 04/23/2016 1154   CL 103 04/16/2016 0613   CL 102 04/15/2016 0919   CL 100 11/04/2012   CO2 27 04/23/2016 1154   CO2 28 04/16/2016 0613   CO2 28 04/15/2016 0919   CO2 28 11/04/2012   GLUCOSE 111 (H) 04/23/2016 1154   GLUCOSE 134 (H) 04/16/2016 0613   GLUCOSE 98 04/15/2016 0919   BUN 16 04/23/2016 1154   BUN 18 04/16/2016 0613   BUN 18 04/15/2016 0919   CREATININE 1.61 (H) 04/23/2016 1154  CREATININE 1.47 (H) 04/16/2016 0613   CREATININE 1.73 (H) 04/15/2016 0919   CALCIUM 8.8 (L) 04/23/2016 1154   CALCIUM 8.1 (L) 04/16/2016 0613   CALCIUM 8.7 (L) 04/15/2016 0919   CALCIUM 9.1 11/04/2012   GFRNONAA 42 (L) 04/23/2016 1154   GFRNONAA 47 (L) 04/16/2016 0613   GFRNONAA 38 (L) 04/15/2016 0919   GFRAA 48 (L) 04/23/2016 1154   GFRAA 54 (L) 04/16/2016 0613   GFRAA 44 (L) 04/15/2016 0919   CMP     Component Value Date/Time   NA 136 04/23/2016 1154   NA 137 11/04/2012   K 4.7 04/23/2016 1154   K 3.9 11/04/2012   CL 103 04/23/2016 1154   CL 100 11/04/2012   CO2 27 04/23/2016 1154   CO2 28 11/04/2012   GLUCOSE 111 (H) 04/23/2016 1154   BUN 16 04/23/2016 1154   CREATININE 1.61 (H) 04/23/2016 1154   CALCIUM 8.8 (L) 04/23/2016 1154   CALCIUM 9.1 11/04/2012   PROT 6.7 04/23/2016 1154   PROT 6.9 11/04/2012   ALBUMIN 3.5 04/23/2016 1154   ALBUMIN 4.4 11/04/2012   AST 38 04/23/2016 1154   AST 27 11/04/2012    ALT 25 04/23/2016 1154   ALKPHOS 133 (H) 04/23/2016 1154   ALKPHOS 58 11/04/2012   BILITOT 0.7 04/23/2016 1154   BILITOT 0.7 11/04/2012   GFRNONAA 42 (L) 04/23/2016 1154   GFRAA 48 (L) 04/23/2016 1154       Component Value Date/Time   WBC 17.4 (H) 04/23/2016 1154   WBC 12.8 (H) 04/16/2016 0613   WBC 22.6 (H) 04/15/2016 0919   HGB 12.2 (L) 04/23/2016 1154   HGB 11.5 (L) 04/16/2016 0613   HGB 14.1 04/15/2016 0919   HCT 38.1 (L) 04/23/2016 1154   HCT 35.3 (L) 04/16/2016 0613   HCT 42.4 04/15/2016 0919   MCV 90.7 04/23/2016 1154   MCV 90.1 04/16/2016 0613   MCV 90.4 04/15/2016 0919    Lipid Panel  No results found for: CHOL, TRIG, HDL, CHOLHDL, VLDL, LDLCALC, LDLDIRECT  ABG No results found for: PHART, PCO2ART, PO2ART, HCO3, TCO2, ACIDBASEDEF, O2SAT   No results found for: TSH BNP (last 3 results) No results for input(s): BNP in the last 8760 hours.  ProBNP (last 3 results) No results for input(s): PROBNP in the last 8760 hours.  Cardiac Panel (last 3 results) No results for input(s): CKTOTAL, CKMB, TROPONINI, RELINDX in the last 72 hours.  Iron/TIBC/Ferritin/ %Sat No results found for: IRON, TIBC, FERRITIN, IRONPCTSAT   EKG Orders placed or performed during the hospital encounter of 04/15/16  . ED EKG  . ED EKG     Prior Assessment and Plan Problem List as of 04/24/2016 Reviewed: 04/12/2016  6:12 PM by Carlyle Dolly, MD     Cardiovascular and Mediastinum   Coronary artery disease due to lipid rich plaque     Digestive   GERD (gastroesophageal reflux disease)   Last Assessment & Plan 01/18/2013 Office Visit Written 01/21/2013 11:09 PM by Orvil Feil, NP    Continue BID PPI. EGD for Barrett's screening and chronic RUQ pain.         Endocrine   Diabetes mellitus ([place])     Genitourinary   CKD (chronic kidney disease) stage 3, GFR 30-59 ml/min     Other   Encounter for screening colonoscopy   Last Assessment & Plan 01/18/2013 Office Visit Written 01/21/2013  11:09 PM by Orvil Feil, NP    70 year old male presenting with need for initial  screening colonoscopy, reporting chronic right-sided abdominal discomfort for the past few years. Associated with bloating, at times exacerbated by eating. No significant change in bowel habits, although he does relate symptoms likely secondary to IBS to include occasional abdominal cramping and loose stool several times a month. No rectal bleeding. With his chronic back pain, question referred pain, IBS, less likely biliary etiology. Will proceed with colonoscopy and upper endoscopy in the near future. With his history of chronic GERD and vague RUQ discomfort, EGD will be performed to screen for Barrett's.   Proceed with colonoscopy/EGD with Dr. Oneida Alar in the near future. The risks, benefits, and alternatives have been discussed in detail with the patient. They state understanding and desire to proceed.  PROPOFOL due to chronic narcotics Obtain outside labs from PCP Consider [place] of abdomen if negative TCS/EGD      Leukocytosis   Chest pain   Left shoulder pain       Imaging: Ct Abdomen Pelvis Wo Contrast  Result Date: 04/23/2016 CLINICAL DATA:  LEFT posterior chest wall mass. Pain and swelling for 1 week EXAM: CT CHEST, ABDOMEN AND PELVIS WITHOUT CONTRAST TECHNIQUE: Multidetector CT imaging of the chest, abdomen and pelvis was performed following the standard protocol without IV contrast. COMPARISON:  CT 02/06/2011, 12/15/2014 FINDINGS: CT CHEST FINDINGS Cardiovascular: Coronary artery calcification and aortic atherosclerotic calcification. Mediastinum/Nodes: No axillary supraclavicular adenopathy. No mediastinal hilar adenopathy. No pericardial fluid. Esophagus Lungs/Pleura: No scattered subpleural pulmonary nodularity. These are numerous and appear benign but less than 5 mm. Musculoskeletal: Beneath the tip of the LEFT shoulder blade, there is thickening of the adjacent musculature to 4.0 x 5.0 cm (image 42, series  2). This soft tissue thickening slightly more dense than adjacent muscle stool tissue density. Lesion measures approximately 6 cm in craniocaudad dimension (image 130, series 5). No IV contrast administered. CT ABDOMEN PELVIS FINDINGS Hepatobiliary: No focal hepatic lesions noncontrast exam. Normal gallbladder. Pancreas: Normal pancreas Spleen: Spleen is enlarged measuring 21 cm in craniocaudad dimension which compares to 20 cm on CT of 12/15/2014. Adrenals/Urinary Tract: Adrenal glands are normal. LEFT kidney most inferior by the enlarged spleen. No hydronephrosis. Bladder normal. Stomach/Bowel: Stomach, small bowel appendix cecum normal. Moderate volume stool in colon. Rectum normal Vascular/Lymphatic: Abdominal aorta is normal caliber with atherosclerotic calcification. There is no retroperitoneal or periportal lymphadenopathy. No pelvic lymphadenopathy. Reproductive: Prostate normal. Other: Large calcified injection granuloma in the LEFT buttock region measures 4 cm x 2.6 cm. Musculoskeletal: Mottled sclerotic pattern to the bones not changed from prior. IMPRESSION: Chest Impression: 1. Oblong soft tissue mass within the musculature inferior to the LEFT scapula likely represents a intramuscular hematoma. Neoplasm would be less likely but not completely excluded. 2. Coronary artery calcification and aortic atherosclerotic calcification. Abdomen / Pelvis Impression: 1. Marked splenomegaly. 2. Mottled sclerotic pattern within the bones unchanged. These results will be called to the ordering clinician or representative by the Radiologist Assistant, and communication documented in the PACS or zVision Dashboard. Electronically Signed   By: Suzy Bouchard M.D.   On: 04/23/2016 15:09   Dg Chest 2 View  Result Date: 04/15/2016 CLINICAL DATA:  Mid and left-sided chest pain began at 3 a.m. today. Unable to raise the left arm due to pain. EXAM: CHEST  2 VIEW COMPARISON:  Portable chest x-ray dated March 31, 2016.  FINDINGS: The lungs are mildly hypoinflated but clear. There is no pneumothorax or pleural effusion. The heart and pulmonary vascularity are normal. The mediastinum is normal in width. There is  calcification in the wall of the thoracic aorta. The bony thorax exhibits no acute abnormality. IMPRESSION: Mild hypoinflation.  No acute cardiopulmonary abnormality. Aortic atherosclerosis. Electronically Signed   By: David  Martinique M.D.   On: 04/15/2016 10:09   Ct Chest Wo Contrast  Result Date: 04/23/2016 CLINICAL DATA:  Left posterior chest wall mass and swelling for 1 week, dyspnea EXAM: CT CHEST WITHOUT CONTRAST TECHNIQUE: Multidetector CT imaging of the chest was performed following the standard protocol without IV contrast. COMPARISON:  04/15/2016 MRI of the left shoulder unfortunately excluded the area of interest. FINDINGS: Cardiovascular: Top normal size cardiac chambers with coronary arteriosclerosis. No pericardial effusion. Atherosclerosis of the aortic arch and descending aorta without aneurysm. Mediastinum/Nodes: No enlarged mediastinal or axillary lymph nodes. Thyroid gland, trachea, and esophagus demonstrate no significant findings. There is slight dilatation of the distally esophagus possibly from aerophagia. Small hiatal hernia Lungs/Pleura: 2 mm nodular density adjacent to the right major fissure within the anterior right lower lobe, series 3, image 87/88. Pleural-based areas of ill-defined opacity suggestive of atelectasis and/or scarring in the left lower lobe, the largest approximately 5 mm, series 3, image 80. Mild lower lobe bronchiectasis. Tiny tree-in-bud densities along the periphery of the left lower lobe. No pneumothorax. Upper Abdomen: Splenomegaly. The spleen measures 15.3 cm AP by RO Normal adrenal glands. The unenhanced liver is unremarkable as is the pancreas for focal mass. No ductal dilatation. The gallbladder is free of stones. Normal bowel rotation. Musculoskeletal: Deep to the left  trapezius muscle is a heterogeneous crescentic soft tissue abnormality suspicious for a hematoma given Hounsfield unit calculations between 50 and 60 and given the patient's clinical history of pain and swelling over the past week. This measures up to 3 cm in thickness by 10 cm in width by 8.3 cm craniocaudad. The etiology is uncertain. Patchy bony demineralization of the dorsal spine with degenerative disc disease at T11-12 and L5-S1. No acute fracture is apparent. IMPRESSION: Heterogeneous crescentic soft tissue abnormality deep to the left trapezius muscle with Hounsfield units suggestive of hematoma with internal heterogeneity suggesting areas of probable clot. This measures approximately 10 x 3 x 8.3 cm. Follow up may prove useful to ensure stability and/or resolution as this may mask other underlying pathology. Patchy bony demineralization of the axial skeleton. Splenomegaly. Electronically Signed   By: Ashley Royalty M.D.   On: 04/23/2016 15:01   Mr Shoulder Left Wo Contrast  Result Date: 04/16/2016 CLINICAL DATA:  Left shoulder pain without a injury. EXAM: MRI OF THE LEFT SHOULDER WITHOUT CONTRAST TECHNIQUE: Multiplanar, multisequence MR imaging of the shoulder was performed. No intravenous contrast was administered. COMPARISON:  None. FINDINGS: Rotator cuff: Mild tendinosis of the supraspinatus tendon with a small insertional interstitial tear. Infraspinatus tendon is intact. Teres minor tendon is intact. Subscapularis tendon is intact. Muscles: No atrophy or fatty replacement of nor abnormal signal within, the muscles of the rotator cuff. Biceps long head:  Intact. Acromioclavicular Joint: Mild arthropathy of the acromioclavicular joint. Type I acromion. Trace amount of subacromial/ subdeltoid bursal fluid. Glenohumeral Joint: No joint effusion.  Mild chondromalacia. Labrum: Limited evaluation secondary lack of intra-articular contrast. Small posterior labral tear at the labroligamentous junction. Bones:  Mild subcortical reactive marrow changes in the lesser tuberosity. No other marrow signal abnormality. No fracture or dislocation. Other: No fluid collection or hematoma. IMPRESSION: 1. Mild tendinosis of the supraspinatus tendon with a small insertional interstitial tear. 2. Mild subacromial/subdeltoid bursitis. Electronically Signed   By: Kathreen Devoid  On: 04/16/2016 08:08   Dg Shoulder Left  Result Date: 04/15/2016 CLINICAL DATA:  Onset of left shoulder pain this morning without known injury EXAM: LEFT SHOULDER - 2+ VIEW COMPARISON:  None in PACs FINDINGS: The bones are subjectively adequately mineralized. The AP view is suboptimally positioned. No acute fracture is observed. The glenohumeral joint space is reasonably well-maintained. The subacromial subdeltoid space is normal. A small spur arises from the inferior tip of the left clavicle. IMPRESSION: No acute bony abnormality of the left shoulder. Small spur arises from the inferior articular margin of the right clavicle and may be impacting the superior aspect of the rotator cuff. Electronically Signed   By: David  Martinique M.D.   On: 04/15/2016 12:03

## 2016-04-25 ENCOUNTER — Ambulatory Visit (HOSPITAL_COMMUNITY)
Admission: RE | Admit: 2016-04-25 | Discharge: 2016-04-25 | Disposition: A | Discharge status: Home / Self Care | Payer: Medicare Other | Source: Ambulatory Visit | Attending: Hematology | Admitting: Hematology

## 2016-04-25 ENCOUNTER — Inpatient Hospital Stay (HOSPITAL_COMMUNITY)
Admission: AD | Admit: 2016-04-25 | Discharge: 2016-05-04 | DRG: 555 | Disposition: A | Discharge status: Home / Self Care | Payer: Medicare Other | Source: Ambulatory Visit | Attending: Internal Medicine | Admitting: Internal Medicine

## 2016-04-25 ENCOUNTER — Encounter (HOSPITAL_COMMUNITY): Payer: Self-pay

## 2016-04-25 ENCOUNTER — Encounter (HOSPITAL_COMMUNITY): Payer: Self-pay | Admitting: *Deleted

## 2016-04-25 DIAGNOSIS — C946 Myelodysplastic disease, not classified: Secondary | ICD-10-CM | POA: Diagnosis present

## 2016-04-25 DIAGNOSIS — T148XXA Other injury of unspecified body region, initial encounter: Secondary | ICD-10-CM | POA: Diagnosis present

## 2016-04-25 DIAGNOSIS — D696 Thrombocytopenia, unspecified: Secondary | ICD-10-CM | POA: Diagnosis present

## 2016-04-25 DIAGNOSIS — E43 Unspecified severe protein-calorie malnutrition: Secondary | ICD-10-CM | POA: Diagnosis present

## 2016-04-25 DIAGNOSIS — R222 Localized swelling, mass and lump, trunk: Secondary | ICD-10-CM | POA: Diagnosis present

## 2016-04-25 DIAGNOSIS — N186 End stage renal disease: Secondary | ICD-10-CM | POA: Diagnosis present

## 2016-04-25 DIAGNOSIS — N183 Chronic kidney disease, stage 3 (moderate): Secondary | ICD-10-CM | POA: Diagnosis not present

## 2016-04-25 DIAGNOSIS — I129 Hypertensive chronic kidney disease with stage 1 through stage 4 chronic kidney disease, or unspecified chronic kidney disease: Secondary | ICD-10-CM | POA: Diagnosis present

## 2016-04-25 DIAGNOSIS — R079 Chest pain, unspecified: Secondary | ICD-10-CM

## 2016-04-25 DIAGNOSIS — Z6826 Body mass index (BMI) 26.0-26.9, adult: Secondary | ICD-10-CM

## 2016-04-25 DIAGNOSIS — M7981 Nontraumatic hematoma of soft tissue: Principal | ICD-10-CM | POA: Diagnosis present

## 2016-04-25 DIAGNOSIS — D471 Chronic myeloproliferative disease: Secondary | ICD-10-CM | POA: Diagnosis present

## 2016-04-25 DIAGNOSIS — R161 Splenomegaly, not elsewhere classified: Secondary | ICD-10-CM | POA: Diagnosis not present

## 2016-04-25 DIAGNOSIS — Z87891 Personal history of nicotine dependence: Secondary | ICD-10-CM

## 2016-04-25 DIAGNOSIS — K219 Gastro-esophageal reflux disease without esophagitis: Secondary | ICD-10-CM | POA: Diagnosis present

## 2016-04-25 DIAGNOSIS — R0902 Hypoxemia: Secondary | ICD-10-CM

## 2016-04-25 DIAGNOSIS — E119 Type 2 diabetes mellitus without complications: Secondary | ICD-10-CM

## 2016-04-25 DIAGNOSIS — D72824 Basophilia: Secondary | ICD-10-CM | POA: Diagnosis not present

## 2016-04-25 DIAGNOSIS — R791 Abnormal coagulation profile: Secondary | ICD-10-CM | POA: Diagnosis present

## 2016-04-25 DIAGNOSIS — Z7984 Long term (current) use of oral hypoglycemic drugs: Secondary | ICD-10-CM

## 2016-04-25 DIAGNOSIS — R0781 Pleurodynia: Secondary | ICD-10-CM | POA: Diagnosis present

## 2016-04-25 DIAGNOSIS — J9601 Acute respiratory failure with hypoxia: Secondary | ICD-10-CM | POA: Diagnosis present

## 2016-04-25 DIAGNOSIS — D7589 Other specified diseases of blood and blood-forming organs: Secondary | ICD-10-CM | POA: Diagnosis present

## 2016-04-25 DIAGNOSIS — D473 Essential (hemorrhagic) thrombocythemia: Secondary | ICD-10-CM | POA: Diagnosis not present

## 2016-04-25 DIAGNOSIS — G9389 Other specified disorders of brain: Secondary | ICD-10-CM | POA: Diagnosis present

## 2016-04-25 DIAGNOSIS — D649 Anemia, unspecified: Secondary | ICD-10-CM | POA: Diagnosis not present

## 2016-04-25 DIAGNOSIS — D72829 Elevated white blood cell count, unspecified: Secondary | ICD-10-CM | POA: Diagnosis not present

## 2016-04-25 DIAGNOSIS — E1122 Type 2 diabetes mellitus with diabetic chronic kidney disease: Secondary | ICD-10-CM | POA: Diagnosis present

## 2016-04-25 DIAGNOSIS — N179 Acute kidney failure, unspecified: Secondary | ICD-10-CM | POA: Diagnosis present

## 2016-04-25 LAB — PROTIME-INR
INR: 1.09
Prothrombin Time: 14.2 seconds (ref 11.4–15.2)

## 2016-04-25 LAB — GLUCOSE, CAPILLARY
GLUCOSE-CAPILLARY: 167 mg/dL — AB (ref 65–99)
GLUCOSE-CAPILLARY: 114 mg/dL — AB (ref 65–99)
Glucose-Capillary: 115 mg/dL — ABNORMAL HIGH (ref 65–99)

## 2016-04-25 LAB — CBC
HEMATOCRIT: 36 % — AB (ref 39.0–52.0)
HEMOGLOBIN: 11.8 g/dL — AB (ref 13.0–17.0)
MCH: 29.4 pg (ref 26.0–34.0)
MCHC: 32.8 g/dL (ref 30.0–36.0)
MCV: 89.8 fL (ref 78.0–100.0)
Platelets: 675 10*3/uL — ABNORMAL HIGH (ref 150–400)
RBC: 4.01 MIL/uL — ABNORMAL LOW (ref 4.22–5.81)
RDW: 22.6 % — AB (ref 11.5–15.5)
WBC: 17.4 10*3/uL — ABNORMAL HIGH (ref 4.0–10.5)

## 2016-04-25 LAB — BONE MARROW EXAM

## 2016-04-25 LAB — APTT: aPTT: 43 seconds — ABNORMAL HIGH (ref 24–36)

## 2016-04-25 MED ORDER — CYCLOBENZAPRINE HCL 5 MG PO TABS
5.0000 mg | ORAL_TABLET | Freq: Three times a day (TID) | ORAL | Status: DC | PRN
  Administered 2016-04-27 – 2016-05-03 (×2): 5 mg via ORAL
  Filled 2016-04-25 (×2): qty 1

## 2016-04-25 MED ORDER — HYDROMORPHONE HCL 2 MG/ML IJ SOLN
0.5000 mg | Freq: Once | INTRAMUSCULAR | Status: AC
  Administered 2016-04-25: 0.5 mg via INTRAVENOUS
  Filled 2016-04-25: qty 1

## 2016-04-25 MED ORDER — FENTANYL CITRATE (PF) 100 MCG/2ML IJ SOLN
INTRAMUSCULAR | Status: AC | PRN
  Administered 2016-04-25 (×4): 50 ug via INTRAVENOUS

## 2016-04-25 MED ORDER — LISINOPRIL 20 MG PO TABS
20.0000 mg | ORAL_TABLET | Freq: Every day | ORAL | Status: DC
  Administered 2016-04-26 – 2016-04-28 (×3): 20 mg via ORAL
  Filled 2016-04-25 (×3): qty 1

## 2016-04-25 MED ORDER — ONDANSETRON HCL 4 MG/2ML IJ SOLN: 4.0000 mg | Freq: Four times a day (QID) | INTRAMUSCULAR | Status: DC | PRN

## 2016-04-25 MED ORDER — DULOXETINE HCL 60 MG PO CPEP
120.0000 mg | ORAL_CAPSULE | Freq: Every day | ORAL | Status: DC
  Administered 2016-04-26 – 2016-05-03 (×8): 120 mg via ORAL
  Filled 2016-04-25 (×8): qty 2

## 2016-04-25 MED ORDER — SODIUM CHLORIDE 0.9 % IV SOLN
INTRAVENOUS | Status: DC
  Administered 2016-04-25: 09:00:00 via INTRAVENOUS

## 2016-04-25 MED ORDER — HYDROMORPHONE HCL 2 MG/ML IJ SOLN
2.0000 mg | INTRAMUSCULAR | Status: DC | PRN
  Administered 2016-04-25 – 2016-04-26 (×5): 2 mg via INTRAVENOUS
  Filled 2016-04-25 (×5): qty 1

## 2016-04-25 MED ORDER — HYDROMORPHONE HCL 1 MG/ML IJ SOLN
1.0000 mg | INTRAMUSCULAR | Status: DC | PRN
  Administered 2016-04-25: 1 mg via INTRAVENOUS
  Filled 2016-04-25: qty 1

## 2016-04-25 MED ORDER — ACETAMINOPHEN 500 MG PO TABS
500.0000 mg | ORAL_TABLET | Freq: Four times a day (QID) | ORAL | Status: DC | PRN
  Administered 2016-05-03: 500 mg via ORAL
  Filled 2016-04-25: qty 1

## 2016-04-25 MED ORDER — FENTANYL CITRATE (PF) 100 MCG/2ML IJ SOLN
INTRAMUSCULAR | Status: AC
  Filled 2016-04-25: qty 4

## 2016-04-25 MED ORDER — SODIUM CHLORIDE 0.9 % IV SOLN
INTRAVENOUS | Status: DC
  Administered 2016-04-25 – 2016-04-27 (×3): via INTRAVENOUS

## 2016-04-25 MED ORDER — MIDAZOLAM HCL 2 MG/2ML IJ SOLN
INTRAMUSCULAR | Status: AC
  Filled 2016-04-25: qty 4

## 2016-04-25 MED ORDER — METOPROLOL SUCCINATE ER 25 MG PO TB24
25.0000 mg | ORAL_TABLET | Freq: Every day | ORAL | Status: DC
  Administered 2016-04-26 – 2016-05-04 (×9): 25 mg via ORAL
  Filled 2016-04-25 (×9): qty 1

## 2016-04-25 MED ORDER — HYDROMORPHONE HCL 2 MG/ML IJ SOLN
2.0000 mg | Freq: Once | INTRAMUSCULAR | Status: AC
  Administered 2016-04-25: 2 mg via INTRAVENOUS
  Filled 2016-04-25: qty 1

## 2016-04-25 MED ORDER — INSULIN ASPART 100 UNIT/ML ~~LOC~~ SOLN
0.0000 [IU] | Freq: Three times a day (TID) | SUBCUTANEOUS | Status: DC
  Administered 2016-04-25: 2 [IU] via SUBCUTANEOUS
  Administered 2016-04-26 – 2016-04-28 (×6): 1 [IU] via SUBCUTANEOUS
  Administered 2016-04-29: 2 [IU] via SUBCUTANEOUS
  Administered 2016-04-29 – 2016-04-30 (×3): 1 [IU] via SUBCUTANEOUS
  Administered 2016-04-30: 2 [IU] via SUBCUTANEOUS
  Administered 2016-05-01 (×2): 1 [IU] via SUBCUTANEOUS
  Administered 2016-05-01 – 2016-05-02 (×2): 2 [IU] via SUBCUTANEOUS
  Administered 2016-05-02: 1 [IU] via SUBCUTANEOUS
  Administered 2016-05-03: 2 [IU] via SUBCUTANEOUS
  Administered 2016-05-04: 1 [IU] via SUBCUTANEOUS

## 2016-04-25 MED ORDER — OXYCODONE-ACETAMINOPHEN 7.5-325 MG PO TABS
1.0000 | ORAL_TABLET | ORAL | Status: DC | PRN
  Administered 2016-04-25: 1 via ORAL
  Filled 2016-04-25: qty 1

## 2016-04-25 MED ORDER — HYDRALAZINE HCL 25 MG PO TABS: 25.0000 mg | ORAL_TABLET | Freq: Four times a day (QID) | ORAL | Status: DC | PRN

## 2016-04-25 MED ORDER — MIDAZOLAM HCL 2 MG/2ML IJ SOLN
INTRAMUSCULAR | Status: AC | PRN
  Administered 2016-04-25 (×4): 1 mg via INTRAVENOUS

## 2016-04-25 MED ORDER — ONDANSETRON HCL 4 MG PO TABS: 4.0000 mg | ORAL_TABLET | Freq: Four times a day (QID) | ORAL | Status: DC | PRN

## 2016-04-25 MED ORDER — PANTOPRAZOLE SODIUM 40 MG PO TBEC
40.0000 mg | DELAYED_RELEASE_TABLET | Freq: Every day | ORAL | Status: DC
  Administered 2016-04-26 – 2016-05-04 (×9): 40 mg via ORAL
  Filled 2016-04-25 (×9): qty 1

## 2016-04-25 NOTE — Progress Notes (Signed)
Dr Irene Limbo here to see patient and family. It was decided patient would be admitted for further evaluation of his pain and swelling of left post upper back area

## 2016-04-25 NOTE — H&P (Signed)
TRH H&P    Patient Demographics:    [name], is a 70 y.o. male  MRN: [id number]  DOB - [birth year]  Admit Date - (Not on file)  Referring MD/NP/PA: Dr Irene Limbo  Outpatient Primary MD for the patient is [facility], MD  Patient coming from: Home  Chest pain    HPI:    [name]  is a 70 y.o. male, With history of hypertension, diabetes mellitus who came to the short stay for bone marrow biopsy, and was found to have worsening of left-sided chest pain. Patient had a CT scan done on 04/23/2016 which showed heterogenous crescentic soft tissue abnormality deep to the left trapezius muscle suggestive of hematoma. 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 He also had moderate of the shoulder on 04/16/2016 which showed mild tendinosis of the supraspinatus tendon with a small insertional interstitial tear.  Patient has noticed worsening of pain and swelling in the left posterior chest wall. Today lab work showed PTT of 43. He denies nausea vomiting or diarrhea. No shortness of breath. Denies any coughing. No fever but has been having episodes of profuse sweating intermittently   Review of systems:      A full 10 point Review of Systems was done, except as stated above, all other Review of Systems were negative.   With Past History of the following :    Past Medical History:  Diagnosis Date  . Anxiety   . Chronic back pain   . Diabetes mellitus   . Gastritis   . GERD (gastroesophageal reflux disease)   . Hypertension   . Renal insufficiency       Past Surgical History:  Procedure Laterality Date  . BACK SURGERY    . COLONOSCOPY WITH PROPOFOL N/A 02/01/2013   SLF: 1. 23 colon polyps removed. 2 retrieved. 2. Mild diverticulosis in teh sigmoid colon 3. Small internal hemorrhoids 4. The colon  is redundant   . ESOPHAGOGASTRODUODENOSCOPY (EGD) WITH PROPOFOL N/A 02/01/2013   SLF: 1. Moderate erosive gastritis  . POLYPECTOMY N/A 02/01/2013   Procedure: POLYPECTOMY;  Surgeon: Danie Binder, MD;  Location: [facility];  Service: Endoscopy;  Laterality: N/A;      Social History:      Social History  Substance Use Topics  . Smoking status: Former Smoker    Packs/day: 1.00    Years: 20.00    Types: Cigars    Quit date: 08/04/2013  . Smokeless tobacco: Never Used  . Alcohol use No     Comment: Remote history of ETOH abuse, "when I was young and dumb"        Family History :     Family History  Problem Relation Age of Onset  . Colon cancer Neg Hx       Home Medications:   Prior to Admission medications   Medication Sig Start Date End Date Taking? Authorizing Provider  acetaminophen (TYLENOL) 500 MG tablet Take 500 mg by mouth every 6 (six) hours as  needed for mild pain or moderate pain.    Historical Provider, MD  aspirin EC 81 MG tablet Take 81 mg by mouth daily.    Historical Provider, MD  cyclobenzaprine (FLEXERIL) 5 MG tablet Take 1 tablet (5 mg total) by mouth 3 (three) times daily as needed for muscle spasms. 08/08/15   Evalee Jefferson, PA-C  DULoxetine (CYMBALTA) 60 MG capsule Take 2 capsules by mouth daily.  09/19/13   Historical Provider, MD  lisinopril (PRINIVIL,ZESTRIL) 20 MG tablet Take 20 mg by mouth daily.    Historical Provider, MD  metFORMIN (GLUCOPHAGE) 500 MG tablet Take 500 mg by mouth daily.    Historical Provider, MD  metoprolol succinate (TOPROL-XL) 25 MG 24 hr tablet Take 25 mg by mouth daily.    Historical Provider, MD  omeprazole (PRILOSEC) 20 MG capsule Take 20 mg by mouth 2 (two) times daily.  01/14/13   Historical Provider, MD  oxyCODONE-acetaminophen (PERCOCET/ROXICET) 5-325 MG tablet Take 1-2 tablets by mouth every 4 (four) hours as needed for severe pain. 04/23/16   Brunetta Genera, MD  traMADol (ULTRAM) 50 MG tablet Take 1 tablet (50 mg total) by  mouth every 12 (twelve) hours as needed for moderate pain. 04/16/16   Samuella Cota, MD     Allergies:     Allergies  Allergen Reactions  . Hydrocodone Itching     Physical Exam:   Vitals  There were no vitals taken for this visit.  1.  General: Caucasian male in mild distress from pain  2. Psychiatric:  Intact judgement and  insight, awake alert, oriented x 3.  3. Neurologic: No focal neurological deficits, all cranial nerves intact.Strength 5/5 all 4 extremities, sensation intact all 4 extremities, plantars down going.  4. Eyes :  anicteric sclerae, moist conjunctivae with no lid lag. PERRLA.  5. ENMT:  Oropharynx clear with moist mucous membranes and good dentition  6. Neck:  supple, no cervical lymphadenopathy appriciated, No thyromegaly  7. Respiratory : Normal respiratory effort, good air movement bilaterally,clear to  auscultation bilaterally  8. Cardiovascular : RRR, no gallops, rubs or murmurs, no leg edema  9. Gastrointestinal:  Positive bowel sounds, abdomen soft, non-tender to palpation,no hepatosplenomegaly, no rigidity or guarding       10. Skin:  No cyanosis, normal texture and turgor, no rash, lesions or ulcers  11.Musculoskeletal:  Large swelling noted in the left scapular region, extending to left anterior chest wall, tender to palpation, no erythema    Data Review:    CBC  Recent Labs Lab 04/23/16 1154 04/25/16 0915  WBC 17.4* 17.4*  HGB 12.2* 11.8*  HCT 38.1* 36.0*  PLT 648* 675*  MCV 90.7 89.8  MCH 29.0 29.4  MCHC 32.0 32.8  RDW 22.6* 22.6*   ------------------------------------------------------------------------------------------------------------------  Chemistries   Recent Labs Lab 04/23/16 1154  NA 136  K 4.7  CL 103  CO2 27  GLUCOSE 111*  BUN 16  CREATININE 1.61*  CALCIUM 8.8*  AST 38  ALT 25  ALKPHOS 133*  BILITOT 0.7    ------------------------------------------------------------------------------------------------------------------  ------------------------------------------------------------------------------------------------------------------ GFR: Estimated Creatinine Clearance: 46.9 mL/min (by C-G formula based on SCr of 1.61 mg/dL (H)). Liver Function Tests:  Recent Labs Lab 04/23/16 1154  AST 38  ALT 25  ALKPHOS 133*  BILITOT 0.7  PROT 6.7  ALBUMIN 3.5   Coagulation Profile:  Recent Labs Lab 04/25/16 0915  INR 1.09   CBG:  Recent Labs Lab 04/25/16 0859  GLUCAP 115*    --------------------------------------------------------------------------------------------------------------- Urine  analysis:    Component Value Date/Time   COLORURINE YELLOW 04/15/2016 1550   APPEARANCEUR CLEAR 04/15/2016 1550   LABSPEC >1.030 (H) 04/15/2016 1550   PHURINE 6.0 04/15/2016 1550   GLUCOSEU NEGATIVE 04/15/2016 1550   HGBUR NEGATIVE 04/15/2016 1550   BILIRUBINUR SMALL (A) 04/15/2016 1550   KETONESUR NEGATIVE 04/15/2016 1550   PROTEINUR >300 (A) 04/15/2016 1550   UROBILINOGEN 0.2 03/26/2008 1920   NITRITE NEGATIVE 04/15/2016 1550   LEUKOCYTESUR NEGATIVE 04/15/2016 1550      Imaging Results:    Ct Biopsy  Result Date: 04/25/2016 INDICATION: 70 year old male with splenomegaly and thrombocytosis and clinical concern for myeloproliferative neoplasm. EXAM: CT GUIDED BONE MARROW ASPIRATION AND CORE BIOPSY Interventional Radiologist:  Criselda Peaches, MD MEDICATIONS: None. ANESTHESIA/SEDATION: Moderate (conscious) sedation was employed during this procedure. A total of 4 milligrams versed and 200 micrograms fentanyl were administered intravenously. The patient's level of consciousness and vital signs were monitored continuously by radiology nursing throughout the procedure under my direct supervision. Total monitored sedation time: 17 minutes FLUOROSCOPY TIME:  Fluoroscopy Time: 0 minutes 0  seconds (0 mGy). COMPLICATIONS: None immediate. Estimated blood loss: <25 mL PROCEDURE: Informed written consent was obtained from the patient after a thorough discussion of the procedural risks, benefits and alternatives. All questions were addressed. Maximal Sterile Barrier Technique was utilized including caps, mask, sterile gowns, sterile gloves, sterile drape, hand hygiene and skin antiseptic. A timeout was performed prior to the initiation of the procedure. The patient was positioned prone and non-contrast localization CT was performed of the pelvis to demonstrate the iliac marrow spaces. Maximal barrier sterile technique utilized including caps, mask, sterile gowns, sterile gloves, large sterile drape, hand hygiene, and betadine prep. Under sterile conditions and local anesthesia, an 11 gauge coaxial bone biopsy needle was advanced into the right iliac marrow space. Needle position was confirmed with CT imaging. Initially, bone marrow aspiration was performed. Next, the 11 gauge outer cannula was utilized to obtain a right iliac bone marrow core biopsy. Needle was removed. Hemostasis was obtained with compression. The patient tolerated the procedure well. Samples were prepared with the cytotechnologist. IMPRESSION: Technically successful CT-guided core biopsy of the right iliac bone. Electronically Signed   By: Jacqulynn Cadet M.D.   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 Left posterior chest wall hematoma- patient will be admitted for pain control and further evaluation for bleeding disorder, hematology/ oncology following. We'll start Dilaudid 4 mg every 4 hours when necessary. 2. ? Myeloproliferative disorder- PTT is elevated 43, platelets 675,  hematology following. Status post bone marrow biopsy today, will follow the biopsy  results 3. Diabetes mellitus- hold metformin, start sliding scale insulin NovoLog 4. Hypertension- continue home medications including Toprol-XL, lisinopril.   DVT Prophylaxis-    SCDs   AM Labs Ordered, also please review Full Orders  Family Communication: Admission, patients condition and plan of care including tests being ordered have been discussed with the patient and *his wife and daughter at bedside* who indicate understanding and agree with the plan and Code Status.  Code Status:  Full code  Admission status: Observation    Time spent in minutes : 60 minutes   Meyli Boice S M.D on 04/25/2016 at 3:32 PM  Between 7am to 7pm - Pager - [phone].  After 7pm go to [email or web address] - password [email or web address]  [facility] - Office  [phone]

## 2016-04-25 NOTE — Consult Note (Signed)
Chief Complaint: Patient was seen in consultation today for CT guided bone marrow biopsy  Referring Physician(s): Brunetta Genera  Supervising Physician: Jacqulynn Cadet  Patient Status: Outpatient  History of Present Illness: [name] is a 70 y.o. male with history of DM, HTN, GERD, and renal insifficiency who has been evaluated recently by Dr. 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 Concern is now for possible myeloproliferative disorder and he presents today for CT guided bone marrow biopsy for further evaluation. He also has been experiencing increasing left post chest /abd discomfort with noted enlarging ST mass, possible hematomas of left post chest and left lower flank regions. He states the noted areas have enlarged since most recent hospital evaluation.   Past Medical History:  Diagnosis Date  . Anxiety   . Chronic back pain   . Diabetes mellitus   . Gastritis   . GERD (gastroesophageal reflux disease)   . Hypertension   . Renal insufficiency     Past Surgical History:  Procedure Laterality Date  . BACK SURGERY    . COLONOSCOPY WITH PROPOFOL N/A 02/01/2013   SLF: 1. 23 colon polyps removed. 2 retrieved. 2. Mild diverticulosis in teh sigmoid colon 3. Small internal hemorrhoids 4. The colon is redundant   . ESOPHAGOGASTRODUODENOSCOPY (EGD) WITH PROPOFOL N/A 02/01/2013   SLF: 1. Moderate erosive gastritis  . POLYPECTOMY N/A 02/01/2013   Procedure: POLYPECTOMY;  Surgeon: Danie Binder, MD;  Location: [facility];  Service: Endoscopy;  Laterality: N/A;    Allergies: Hydrocodone  Medications: Prior to Admission medications   Medication Sig Start Date End Date Taking? Authorizing Provider  acetaminophen (TYLENOL) 500 MG tablet Take 500 mg by mouth every 6 (six) hours as needed for mild pain or moderate pain.   Yes Historical Provider, MD  aspirin EC 81 MG tablet Take 81 mg by mouth  daily.   Yes Historical Provider, MD  cyclobenzaprine (FLEXERIL) 5 MG tablet Take 1 tablet (5 mg total) by mouth 3 (three) times daily as needed for muscle spasms. 08/08/15  Yes Evalee Jefferson, PA-C  DULoxetine (CYMBALTA) 60 MG capsule Take 2 capsules by mouth daily.  09/19/13  Yes Historical Provider, MD  lisinopril (PRINIVIL,ZESTRIL) 20 MG tablet Take 20 mg by mouth daily.   Yes Historical Provider, MD  metFORMIN (GLUCOPHAGE) 500 MG tablet Take 500 mg by mouth daily.   Yes Historical Provider, MD  metoprolol succinate (TOPROL-XL) 25 MG 24 hr tablet Take 25 mg by mouth daily.   Yes Historical Provider, MD  omeprazole (PRILOSEC) 20 MG capsule Take 20 mg by mouth 2 (two) times daily.  01/14/13  Yes Historical Provider, MD  oxyCODONE-acetaminophen (PERCOCET/ROXICET) 5-325 MG tablet Take 1-2 tablets by mouth every 4 (four) hours as needed for severe pain. 04/23/16  Yes Brunetta Genera, MD  traMADol (ULTRAM) 50 MG tablet Take 1 tablet (50 mg total) by mouth every 12 (twelve) hours as needed for moderate pain. 04/16/16  Yes Samuella Cota, MD     Family History  Problem Relation Age of Onset  . Colon cancer Neg Hx     Social History   Social History  . Marital status: Divorced    Spouse name: N/A  . Number of children: N/A  . Years of education: N/A   Occupational History  . retired     Architect   Social History Main Topics  . Smoking status: Former Smoker    Packs/day: 1.00  Years: 20.00    Types: Cigars    Quit date: 08/04/2013  . Smokeless tobacco: Never Used  . 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 Drug use: No  . Sexual activity: Yes    Birth control/ protection: None   Other Topics Concern  . None   Social History Narrative  . None      Review of Systems denies fever, HA, N/V or abnormal bleeding. He does have chest pain, dyspnea, occ cough, abd/back/shoulder pain, wt loss and occ sweats.   Vital Signs: BP (!)  161/86 (BP Location: Left Arm)   Pulse 70   Temp 98.4 F (36.9 C) (Oral)   Resp 18   SpO2 99%   Physical Exam awake/alert; c/o left post chest pain; chest- CTA bilat; heart- RRR; abd- soft,+ BS, mild generalized tenderness; LE- no edema; large palpable ST mass? Hematoma left post chest/scapular region, tender to palpation; area of ST swelling/ecchymosis left lower flank region, tender to palpation  Mallampati Score:     Imaging: Ct Abdomen Pelvis Wo Contrast  Result Date: 04/23/2016 CLINICAL DATA:  LEFT posterior chest wall mass. Pain and swelling for 1 week EXAM: CT CHEST, ABDOMEN AND PELVIS WITHOUT CONTRAST TECHNIQUE: Multidetector CT imaging of the chest, abdomen and pelvis was performed following the standard protocol without IV contrast. COMPARISON:  CT 02/06/2011, 12/15/2014 FINDINGS: CT CHEST FINDINGS Cardiovascular: Coronary artery calcification and aortic atherosclerotic calcification. Mediastinum/Nodes: No axillary supraclavicular adenopathy. No mediastinal hilar adenopathy. No pericardial fluid. Esophagus Lungs/Pleura: No scattered subpleural pulmonary nodularity. These are numerous and appear benign but less than 5 mm. Musculoskeletal: Beneath the tip of the LEFT shoulder blade, there is thickening of the adjacent musculature to 4.0 x 5.0 cm (image 42, series 2). This soft tissue thickening slightly more dense than adjacent muscle stool tissue density. Lesion measures approximately 6 cm in craniocaudad dimension (image 130, series 5). No IV contrast administered. CT ABDOMEN PELVIS FINDINGS Hepatobiliary: No focal hepatic lesions noncontrast exam. Normal gallbladder. Pancreas: Normal pancreas Spleen: Spleen is enlarged measuring 21 cm in craniocaudad dimension which compares to 20 cm on CT of 12/15/2014. Adrenals/Urinary Tract: Adrenal glands are normal. LEFT kidney most inferior by the enlarged spleen. No hydronephrosis. Bladder normal. Stomach/Bowel: Stomach, small bowel appendix cecum  normal. Moderate volume stool in colon. Rectum normal Vascular/Lymphatic: Abdominal aorta is normal caliber with atherosclerotic calcification. There is no retroperitoneal or periportal lymphadenopathy. No pelvic lymphadenopathy. Reproductive: Prostate normal. Other: Large calcified injection granuloma in the LEFT buttock region measures 4 cm x 2.6 cm. Musculoskeletal: Mottled sclerotic pattern to the bones not changed from prior. IMPRESSION: Chest Impression: 1. Oblong soft tissue mass within the musculature inferior to the LEFT scapula likely represents a intramuscular hematoma. Neoplasm would be less likely but not completely excluded. 2. Coronary artery calcification and aortic atherosclerotic calcification. Abdomen / Pelvis Impression: 1. Marked splenomegaly. 2. Mottled sclerotic pattern within the bones unchanged. These results will be called to the ordering clinician or representative by the Radiologist Assistant, and communication documented in the PACS or zVision Dashboard. Electronically Signed   By: Suzy Bouchard M.D.   On: 04/23/2016 15:09   Dg Chest 2 View  Result Date: 04/15/2016 CLINICAL DATA:  Mid and left-sided chest pain began at 3 a.m. today. Unable to raise the left arm due to pain. EXAM: CHEST  2 VIEW COMPARISON:  Portable chest x-ray dated March 31, 2016. FINDINGS: The lungs are  mildly hypoinflated but clear. There is no pneumothorax or pleural effusion. The heart and pulmonary vascularity are normal. The mediastinum is normal in width. There is calcification in the wall of the thoracic aorta. The bony thorax exhibits no acute abnormality. IMPRESSION: Mild hypoinflation.  No acute cardiopulmonary abnormality. Aortic atherosclerosis. Electronically Signed   By: David  Martinique M.D.   On: 04/15/2016 10:09   Ct Chest Wo Contrast  Result Date: 04/23/2016 CLINICAL DATA:  Left posterior chest wall mass and swelling for 1 week, dyspnea EXAM: CT CHEST WITHOUT CONTRAST TECHNIQUE:  Multidetector CT imaging of the chest was performed following the standard protocol without IV contrast. COMPARISON:  04/15/2016 MRI of the left shoulder unfortunately excluded the area of interest. FINDINGS: Cardiovascular: Top normal size cardiac chambers with coronary arteriosclerosis. No pericardial effusion. Atherosclerosis of the aortic arch and descending aorta without aneurysm. Mediastinum/Nodes: No enlarged mediastinal or axillary lymph nodes. Thyroid gland, trachea, and esophagus demonstrate no significant findings. There is slight dilatation of the distally esophagus possibly from aerophagia. Small hiatal hernia Lungs/Pleura: 2 mm nodular density adjacent to the right major fissure within the anterior right lower lobe, series 3, image 87/88. Pleural-based areas of ill-defined opacity suggestive of atelectasis and/or scarring in the left lower lobe, the largest approximately 5 mm, series 3, image 80. Mild lower lobe bronchiectasis. Tiny tree-in-bud densities along the periphery of the left lower lobe. No pneumothorax. Upper Abdomen: Splenomegaly. The spleen measures 15.3 cm AP by RO Normal adrenal glands. The unenhanced liver is unremarkable as is the pancreas for focal mass. No ductal dilatation. The gallbladder is free of stones. Normal bowel rotation. Musculoskeletal: Deep to the left trapezius muscle is a heterogeneous crescentic soft tissue abnormality suspicious for a hematoma given Hounsfield unit calculations between 50 and 60 and given the patient's clinical history of pain and swelling over the past week. This measures up to 3 cm in thickness by 10 cm in width by 8.3 cm craniocaudad. The etiology is uncertain. Patchy bony demineralization of the dorsal spine with degenerative disc disease at T11-12 and L5-S1. No acute fracture is apparent. IMPRESSION: Heterogeneous crescentic soft tissue abnormality deep to the left trapezius muscle with Hounsfield units suggestive of hematoma with internal  heterogeneity suggesting areas of probable clot. This measures approximately 10 x 3 x 8.3 cm. Follow up may prove useful to ensure stability and/or resolution as this may mask other underlying pathology. Patchy bony demineralization of the axial skeleton. Splenomegaly. Electronically Signed   By: Ashley Royalty M.D.   On: 04/23/2016 15:01   Mr Shoulder Left Wo Contrast  Result Date: 04/16/2016 CLINICAL DATA:  Left shoulder pain without a injury. EXAM: MRI OF THE LEFT SHOULDER WITHOUT CONTRAST TECHNIQUE: Multiplanar, multisequence MR imaging of the shoulder was performed. No intravenous contrast was administered. COMPARISON:  None. FINDINGS: Rotator cuff: Mild tendinosis of the supraspinatus tendon with a small insertional interstitial tear. Infraspinatus tendon is intact. Teres minor tendon is intact. Subscapularis tendon is intact. Muscles: No atrophy or fatty replacement of nor abnormal signal within, the muscles of the rotator cuff. Biceps long head:  Intact. Acromioclavicular Joint: Mild arthropathy of the acromioclavicular joint. Type I acromion. Trace amount of subacromial/ subdeltoid bursal fluid. Glenohumeral Joint: No joint effusion.  Mild chondromalacia. Labrum: Limited evaluation secondary lack of intra-articular contrast. Small posterior labral tear at the labroligamentous junction. Bones: Mild subcortical reactive marrow changes in the lesser tuberosity. No other marrow signal abnormality. No fracture or dislocation. Other: No fluid collection or hematoma. IMPRESSION: 1.  Mild tendinosis of the supraspinatus tendon with a small insertional interstitial tear. 2. Mild subacromial/subdeltoid bursitis. Electronically Signed   By: Kathreen Devoid   On: 04/16/2016 08:08   Dg Shoulder Left  Result Date: 04/15/2016 CLINICAL DATA:  Onset of left shoulder pain this morning without known injury EXAM: LEFT SHOULDER - 2+ VIEW COMPARISON:  None in PACs FINDINGS: The bones are subjectively adequately mineralized. The  AP view is suboptimally positioned. No acute fracture is observed. The glenohumeral joint space is reasonably well-maintained. The subacromial subdeltoid space is normal. A small spur arises from the inferior tip of the left clavicle. IMPRESSION: No acute bony abnormality of the left shoulder. Small spur arises from the inferior articular margin of the right clavicle and may be impacting the superior aspect of the rotator cuff. Electronically Signed   By: David  Martinique M.D.   On: 04/15/2016 12:03    Labs:  CBC:  Recent Labs  04/15/16 0919 04/16/16 0613 04/23/16 1154 04/25/16 0915  WBC 22.6* 12.8* 17.4* 17.4*  HGB 14.1 11.5* 12.2* 11.8*  HCT 42.4 35.3* 38.1* 36.0*  PLT 607* 422* 648* 675*    COAGS: No results for input(s): INR, APTT in the last 8760 hours.  BMP:  Recent Labs  04/09/16 1125 04/15/16 0919 04/16/16 0613 04/23/16 1154  NA 136 138 136 136  K 4.7 4.2 3.9 4.7  CL 104 102 103 103  CO2 _0 GLUCOSE 112* 98 134* 111*  BUN 23* _1 CALCIUM 8.3* 8.7* 8.1* 8.8*  CREATININE 1.82* 1.73* 1.47* 1.61*  GFRNONAA 36* 38* 47* 42*  GFRAA 42* 44* 54* 48*    LIVER FUNCTION TESTS:  Recent Labs  04/09/16 1125 04/15/16 0919 04/23/16 1154  BILITOT 0.6 0.6 0.7  AST 34 41 38  ALT _2 ALKPHOS 114 128* 133*  PROT 6.7 7.2 6.7  ALBUMIN 3.6 3.9 3.5    TUMOR MARKERS: No results for input(s): AFPTM, CEA, CA199, CHROMGRNA in the last 8760 hours.  Assessment and Plan: 70 y.o. male with history of DM, HTN, GERD, and renal insifficiency who has been evaluated recently by Dr. 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 Concern is now for possible myeloproliferative disorder and he presents today for CT guided bone marrow biopsy for further evaluation. He also has been experiencing increasing left post chest /abd discomfort with noted enlarging ST mass, possible hematomas of  left post chest and left lower flank regions. He states the noted areas have enlarged since most recent hospital evaluation. Risks and benefits discussed with the patient/family including, but not limited to bleeding, infection, damage to adjacent structures or low yield requiring additional tests.All of the patient's questions were answered, patient is agreeable to proceed.Consent signed and in chart. Pt may require additional f/u imaging of chest/abd/pelvic region to f/u with expanding ST masses/?hematomas- will d/w Dr. Laurence Ferrari.      Thank you for this interesting consult.  I greatly enjoyed meeting DEFORREST BOGLE and look forward to participating in their care.  A copy of this report was sent to the requesting provider on this date.  Electronically Signed: D. Rowe Robert 04/25/2016, 9:47 AM   I spent a total of  25 minutes   in face to face in clinical consultation, greater than 50% of which was counseling/coordinating care for CT guided bone marrow biopsy

## 2016-04-25 NOTE — Progress Notes (Signed)
[name]   HEMATOLOGY/ONCOLOGY INPATIENT PROGRESS NOTE  Date of Service: 04/25/2016  Inpatient Attending: .Oswald Hillock, MD   SUBJECTIVE  Patient was seen in clinic at [facility] in followup for workup of leucocytosis and thrombocytosis on 04/23/2016 and had noted new left posterior chest wall swelling with significant pain. Stat CT chest /abd/pelvis was noted to show left chest wall hematoma. He was given pain meds. He has not been on anticoagulants but has had uncontrolled hypertension. His smear was concerning for MPN with some increased blasts ?CML -- he was setup for Bone marrow biopsy which he had today. Labs this AM showed PLT 675k. Hgb has dropped from 14 down to 11.4 in the last 10 days?due to hematoma. After his Bm Bx today he noted increasing swelling/hematoma over his left posterior chest wall and uncontrolled pain despite po narcotic pain medications. I discussed with IR/radiology- they recommended MRI chest to r/o underlying chest wall tumor that might have bleed. I discussed prelim Bone marrow biopsy results with Dr Monica Martinez -- poor aspirate sample with primarily blood no good spicules. Core biopsy will be processed on Monday. Patient was agreeable to be admitted for pain mx, additional workup of chest wall mass and workup of newly noted coagulopathy.   OBJECTIVE:  Mild distress due to chest wall pain  PHYSICAL EXAMINATION: . Vitals:   04/25/16 1618 04/25/16 2040  BP: (!) 150/72 (!) 153/70  Pulse: 60 68  Resp: 16 17  Temp: 98.3 F (36.8 C) 98 F (36.7 C)  TempSrc: Oral Oral  SpO2: 94% 98%  Weight: 197 lb (89.4 kg)   Height: 6' (1.829 m)    Filed Weights   04/25/16 1618  Weight: 197 lb (89.4 kg)   .Body mass index is 26.72 kg/m.  GENERAL:alert, in no acute distress and comfortable SKIN: skin color, texture, turgor are normal, no rashes or significant lesions EYES: normal, conjunctiva are pink and non-injected, sclera clear OROPHARYNX:no exudate, no  erythema and lips, buccal mucosa, and tongue normal  NECK: supple, no JVD, thyroid normal size, non-tender, without nodularity CHEST: left posterior chest wall.hematoma LYMPH:  no palpable lymphadenopathy in the cervical, axillary or inguinal LUNGS: clear to auscultation with normal respiratory effort HEART: regular rate & rhythm,  no murmurs and no lower extremity edema ABDOMEN: abdomen soft, non-tender, normoactive bowel sounds  Musculoskeletal: no cyanosis of digits and no clubbing  PSYCH: alert & oriented x 3 with fluent speech NEURO: no focal motor/sensory deficits  MEDICAL HISTORY:  Past Medical History:  Diagnosis Date  . Anxiety   . Chronic back pain   . Diabetes mellitus   . Gastritis   . GERD (gastroesophageal reflux disease)   . Hypertension   . Renal insufficiency     SURGICAL HISTORY: Past Surgical History:  Procedure Laterality Date  . BACK SURGERY    . COLONOSCOPY WITH PROPOFOL N/A 02/01/2013   SLF: 1. 23 colon polyps removed. 2 retrieved. 2. Mild diverticulosis in teh sigmoid colon 3. Small internal hemorrhoids 4. The colon is redundant   . ESOPHAGOGASTRODUODENOSCOPY (EGD) WITH PROPOFOL N/A 02/01/2013   SLF: 1. Moderate erosive gastritis  . POLYPECTOMY N/A 02/01/2013   Procedure: POLYPECTOMY;  Surgeon: Danie Binder, MD;  Location: [facility];  Service: Endoscopy;  Laterality: N/A;    SOCIAL HISTORY: Social History   Social History  . Marital status: Divorced    Spouse name: N/A  . Number of children: N/A  . Years of education: N/A   Occupational History  .  retired     Architect   Social History Main Topics  . Smoking status: Former Smoker    Packs/day: 1.00    Years: 20.00    Types: Cigars    Quit date: 08/04/2013  . Smokeless tobacco: Never Used  . 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 Drug use: No  . Sexual activity: Yes    Birth control/ protection: None   Other Topics Concern  . Not on  file   Social History Narrative  . No narrative on file    FAMILY HISTORY: Family History  Problem Relation Age of Onset  . Colon cancer Neg Hx     ALLERGIES:  is allergic to hydrocodone.  MEDICATIONS:  Scheduled Meds: . [START ON 04/26/2016] DULoxetine  120 mg Oral Daily  . insulin aspart  0-9 Units Subcutaneous TID WC  . [START ON 04/26/2016] lisinopril  20 mg Oral Daily  . [START ON 04/26/2016] metoprolol succinate  25 mg Oral Daily  . [START ON 04/26/2016] pantoprazole  40 mg Oral Daily   Continuous Infusions: . sodium chloride 75 mL/hr at 04/25/16 1714   PRN Meds:.acetaminophen, cyclobenzaprine, hydrALAZINE, HYDROmorphone (DILAUDID) injection, ondansetron **OR** ondansetron (ZOFRAN) IV  REVIEW OF SYSTEMS:    10 Point review of Systems was done is negative except as noted above.   LABORATORY DATA:  I have reviewed the data as listed  . CBC Latest Ref Rng & Units 04/25/2016 04/23/2016 04/16/2016  WBC 4.0 - 10.5 K/uL 17.4(H) 17.4(H) 12.8(H)  Hemoglobin 13.0 - 17.0 g/dL 11.8(L) 12.2(L) 11.5(L)  Hematocrit 39.0 - 52.0 % 36.0(L) 38.1(L) 35.3(L)  Platelets 150 - 400 K/uL 675(H) 648(H) 422(H)    . CMP Latest Ref Rng & Units 04/23/2016 04/16/2016 04/15/2016  Glucose 65 - 99 mg/dL 111(H) 134(H) 98  BUN 6 - 20 mg/dL '16 18 18  '$ Creatinine 0.61 - 1.24 mg/dL 1.61(H) 1.47(H) 1.73(H)  Sodium 135 - 145 mmol/L 136 136 138  Potassium 3.5 - 5.1 mmol/L 4.7 3.9 4.2  Chloride 101 - 111 mmol/L 103 103 102  CO2 22 - 32 mmol/L '27 28 28  '$ Calcium 8.9 - 10.3 mg/dL 8.8(L) 8.1(L) 8.7(L)  Total Protein 6.5 - 8.1 g/dL 6.7 - 7.2  Total Bilirubin 0.3 - 1.2 mg/dL 0.7 - 0.6  Alkaline Phos 38 - 126 U/L 133(H) - 128(H)  AST 15 - 41 U/L 38 - 41  ALT 17 - 63 U/L 25 - 27     RADIOGRAPHIC STUDIES: I have personally reviewed the radiological images as listed and agreed with the findings in the report. Ct Abdomen Pelvis Wo Contrast  Result Date: 04/23/2016 CLINICAL DATA:  LEFT posterior chest wall  mass. Pain and swelling for 1 week EXAM: CT CHEST, ABDOMEN AND PELVIS WITHOUT CONTRAST TECHNIQUE: Multidetector CT imaging of the chest, abdomen and pelvis was performed following the standard protocol without IV contrast. COMPARISON:  CT 02/06/2011, 12/15/2014 FINDINGS: CT CHEST FINDINGS Cardiovascular: Coronary artery calcification and aortic atherosclerotic calcification. Mediastinum/Nodes: No axillary supraclavicular adenopathy. No mediastinal hilar adenopathy. No pericardial fluid. Esophagus Lungs/Pleura: No scattered subpleural pulmonary nodularity. These are numerous and appear benign but less than 5 mm. Musculoskeletal: Beneath the tip of the LEFT shoulder blade, there is thickening of the adjacent musculature to 4.0 x 5.0 cm (image 42, series 2). This soft tissue thickening slightly more dense than adjacent muscle stool tissue density. Lesion measures approximately 6 cm in craniocaudad dimension (image  130, series 5). No IV contrast administered. CT ABDOMEN PELVIS FINDINGS Hepatobiliary: No focal hepatic lesions noncontrast exam. Normal gallbladder. Pancreas: Normal pancreas Spleen: Spleen is enlarged measuring 21 cm in craniocaudad dimension which compares to 20 cm on CT of 12/15/2014. Adrenals/Urinary Tract: Adrenal glands are normal. LEFT kidney most inferior by the enlarged spleen. No hydronephrosis. Bladder normal. Stomach/Bowel: Stomach, small bowel appendix cecum normal. Moderate volume stool in colon. Rectum normal Vascular/Lymphatic: Abdominal aorta is normal caliber with atherosclerotic calcification. There is no retroperitoneal or periportal lymphadenopathy. No pelvic lymphadenopathy. Reproductive: Prostate normal. Other: Large calcified injection granuloma in the LEFT buttock region measures 4 cm x 2.6 cm. Musculoskeletal: Mottled sclerotic pattern to the bones not changed from prior. IMPRESSION: Chest Impression: 1. Oblong soft tissue mass within the musculature inferior to the LEFT scapula  likely represents a intramuscular hematoma. Neoplasm would be less likely but not completely excluded. 2. Coronary artery calcification and aortic atherosclerotic calcification. Abdomen / Pelvis Impression: 1. Marked splenomegaly. 2. Mottled sclerotic pattern within the bones unchanged. These results will be called to the ordering clinician or representative by the Radiologist Assistant, and communication documented in the PACS or zVision Dashboard. Electronically Signed   By: Suzy Bouchard M.D.   On: 04/23/2016 15:09   Dg Chest 2 View  Result Date: 04/15/2016 CLINICAL DATA:  Mid and left-sided chest pain began at 3 a.m. today. Unable to raise the left arm due to pain. EXAM: CHEST  2 VIEW COMPARISON:  Portable chest x-ray dated March 31, 2016. FINDINGS: The lungs are mildly hypoinflated but clear. There is no pneumothorax or pleural effusion. The heart and pulmonary vascularity are normal. The mediastinum is normal in width. There is calcification in the wall of the thoracic aorta. The bony thorax exhibits no acute abnormality. IMPRESSION: Mild hypoinflation.  No acute cardiopulmonary abnormality. Aortic atherosclerosis. Electronically Signed   By: David  Martinique M.D.   On: 04/15/2016 10:09   Ct Chest Wo Contrast  Result Date: 04/23/2016 CLINICAL DATA:  Left posterior chest wall mass and swelling for 1 week, dyspnea EXAM: CT CHEST WITHOUT CONTRAST TECHNIQUE: Multidetector CT imaging of the chest was performed following the standard protocol without IV contrast. COMPARISON:  04/15/2016 MRI of the left shoulder unfortunately excluded the area of interest. FINDINGS: Cardiovascular: Top normal size cardiac chambers with coronary arteriosclerosis. No pericardial effusion. Atherosclerosis of the aortic arch and descending aorta without aneurysm. Mediastinum/Nodes: No enlarged mediastinal or axillary lymph nodes. Thyroid gland, trachea, and esophagus demonstrate no significant findings. There is slight  dilatation of the distally esophagus possibly from aerophagia. Small hiatal hernia Lungs/Pleura: 2 mm nodular density adjacent to the right major fissure within the anterior right lower lobe, series 3, image 87/88. Pleural-based areas of ill-defined opacity suggestive of atelectasis and/or scarring in the left lower lobe, the largest approximately 5 mm, series 3, image 80. Mild lower lobe bronchiectasis. Tiny tree-in-bud densities along the periphery of the left lower lobe. No pneumothorax. Upper Abdomen: Splenomegaly. The spleen measures 15.3 cm AP by RO Normal adrenal glands. The unenhanced liver is unremarkable as is the pancreas for focal mass. No ductal dilatation. The gallbladder is free of stones. Normal bowel rotation. Musculoskeletal: Deep to the left trapezius muscle is a heterogeneous crescentic soft tissue abnormality suspicious for a hematoma given Hounsfield unit calculations between 50 and 60 and given the patient's clinical history of pain and swelling over the past week. This measures up to 3 cm in thickness by 10 cm in width by 8.3 cm  craniocaudad. The etiology is uncertain. Patchy bony demineralization of the dorsal spine with degenerative disc disease at T11-12 and L5-S1. No acute fracture is apparent. IMPRESSION: Heterogeneous crescentic soft tissue abnormality deep to the left trapezius muscle with Hounsfield units suggestive of hematoma with internal heterogeneity suggesting areas of probable clot. This measures approximately 10 x 3 x 8.3 cm. Follow up may prove useful to ensure stability and/or resolution as this may mask other underlying pathology. Patchy bony demineralization of the axial skeleton. Splenomegaly. Electronically Signed   By: Ashley Royalty M.D.   On: 04/23/2016 15:01   Mr Shoulder Left Wo Contrast  Result Date: 04/16/2016 CLINICAL DATA:  Left shoulder pain without a injury. EXAM: MRI OF THE LEFT SHOULDER WITHOUT CONTRAST TECHNIQUE: Multiplanar, multisequence MR imaging of  the shoulder was performed. No intravenous contrast was administered. COMPARISON:  None. FINDINGS: Rotator cuff: Mild tendinosis of the supraspinatus tendon with a small insertional interstitial tear. Infraspinatus tendon is intact. Teres minor tendon is intact. Subscapularis tendon is intact. Muscles: No atrophy or fatty replacement of nor abnormal signal within, the muscles of the rotator cuff. Biceps long head:  Intact. Acromioclavicular Joint: Mild arthropathy of the acromioclavicular joint. Type I acromion. Trace amount of subacromial/ subdeltoid bursal fluid. Glenohumeral Joint: No joint effusion.  Mild chondromalacia. Labrum: Limited evaluation secondary lack of intra-articular contrast. Small posterior labral tear at the labroligamentous junction. Bones: Mild subcortical reactive marrow changes in the lesser tuberosity. No other marrow signal abnormality. No fracture or dislocation. Other: No fluid collection or hematoma. IMPRESSION: 1. Mild tendinosis of the supraspinatus tendon with a small insertional interstitial tear. 2. Mild subacromial/subdeltoid bursitis. Electronically Signed   By: Kathreen Devoid   On: 04/16/2016 08:08   Ct Biopsy  Result Date: 04/25/2016 INDICATION: 70 year old male with splenomegaly and thrombocytosis and clinical concern for myeloproliferative neoplasm. EXAM: CT GUIDED BONE MARROW ASPIRATION AND CORE BIOPSY Interventional Radiologist:  Criselda Peaches, MD MEDICATIONS: None. ANESTHESIA/SEDATION: Moderate (conscious) sedation was employed during this procedure. A total of 4 milligrams versed and 200 micrograms fentanyl were administered intravenously. The patient's level of consciousness and vital signs were monitored continuously by radiology nursing throughout the procedure under my direct supervision. Total monitored sedation time: 17 minutes FLUOROSCOPY TIME:  Fluoroscopy Time: 0 minutes 0 seconds (0 mGy). COMPLICATIONS: None immediate. Estimated blood loss: <25 mL  PROCEDURE: Informed written consent was obtained from the patient after a thorough discussion of the procedural risks, benefits and alternatives. All questions were addressed. Maximal Sterile Barrier Technique was utilized including caps, mask, sterile gowns, sterile gloves, sterile drape, hand hygiene and skin antiseptic. A timeout was performed prior to the initiation of the procedure. The patient was positioned prone and non-contrast localization CT was performed of the pelvis to demonstrate the iliac marrow spaces. Maximal barrier sterile technique utilized including caps, mask, sterile gowns, sterile gloves, large sterile drape, hand hygiene, and betadine prep. Under sterile conditions and local anesthesia, an 11 gauge coaxial bone biopsy needle was advanced into the right iliac marrow space. Needle position was confirmed with CT imaging. Initially, bone marrow aspiration was performed. Next, the 11 gauge outer cannula was utilized to obtain a right iliac bone marrow core biopsy. Needle was removed. Hemostasis was obtained with compression. The patient tolerated the procedure well. Samples were prepared with the cytotechnologist. IMPRESSION: Technically successful CT-guided core biopsy of the right iliac bone. Electronically Signed   By: Jacqulynn Cadet M.D.   On: 04/25/2016 12:47   Dg Shoulder Left  Result  Date: 04/15/2016 CLINICAL DATA:  Onset of left shoulder pain this morning without known injury EXAM: LEFT SHOULDER - 2+ VIEW COMPARISON:  None in PACs FINDINGS: The bones are subjectively adequately mineralized. The AP view is suboptimally positioned. No acute fracture is observed. The glenohumeral joint space is reasonably well-maintained. The subacromial subdeltoid space is normal. A small spur arises from the inferior tip of the left clavicle. IMPRESSION: No acute bony abnormality of the left shoulder. Small spur arises from the inferior articular margin of the right clavicle and may be impacting  the superior aspect of the rotator cuff. Electronically Signed   By: David  Martinique M.D.   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 Aspirate - predominantly peripheral blood, touch smear -poor quality (discussed with Dr Monica Martinez), core bx results likely on Monday -if significantly increasing PLT counts -might need to consider hydroxyurea  2) Abnormal PTT (patient not on blood thinners) ?factor inhibitor  -rpt PTT -PTT Mixing study -fviii and fix level -vwd panel -fibrinogen  3) Left chest wall increasing hematoma Plan -monitor hgb -pain mx per hospitalist -MRI chest - to r/o underlying tumor -evaluation and rx of coagulopathy -monitor and optimzie htn control -no blood thinners/NSAIDS  4) chest pain - recent positive stress test 5) CKD  Dr Burr Medico will see patient over the weekend.  I spent 40 minutes counseling the patient face to face. The total time spent in the appointment was 40 minutes and more than 50% was on counseling and direct patient cares.    Sullivan Lone MD [place] AAHIVMS [facility] Hematology/Oncology Physician [facility]  (Office):       [phone] (Work cell):  [phone] (Fax):           [phone]

## 2016-04-25 NOTE — Procedures (Signed)
Interventional Radiology Procedure Note  Procedure: CT guided aspirate and core biopsy of right iliac bone Complications: None Recommendations: - Bedrest supine x 1 hrs - Hydrocodone PRN  Pain - Follow biopsy results  Signed,  Verl Whitmore K. Burna Atlas, MD   

## 2016-04-25 NOTE — Discharge Instructions (Signed)
Moderate Conscious Sedation, Adult, Care After °Refer to this sheet in the next few weeks. These instructions provide you with information on caring for yourself after your procedure. Your health care provider may also give you more specific instructions. Your treatment has been planned according to current medical practices, but problems sometimes occur. Call your health care provider if you have any problems or questions after your procedure. °WHAT TO EXPECT AFTER THE PROCEDURE  °After your procedure: °· You may feel sleepy, clumsy, and have poor balance for several hours. °· Vomiting may occur if you eat too soon after the procedure. °HOME CARE INSTRUCTIONS °· Do not participate in any activities where you could become injured for at least 24 hours. Do not: °¨ Drive. °¨ Swim. °¨ Ride a bicycle. °¨ Operate heavy machinery. °¨ Cook. °¨ Use power tools. °¨ Climb ladders. °¨ Work from a high place. °· Do not make important decisions or sign legal documents until you are improved. °· If you vomit, drink water, juice, or soup when you can drink without vomiting. Make sure you have little or no nausea before eating solid foods. °· Only take over-the-counter or prescription medicines for pain, discomfort, or fever as directed by your health care provider. °· Make sure you and your family fully understand everything about the medicines given to you, including what side effects may occur. °· You should not drink alcohol, take sleeping pills, or take medicines that cause drowsiness for at least 24 hours. °· If you smoke, do not smoke without supervision. °· If you are feeling better, you may resume normal activities 24 hours after you were sedated. °· Keep all appointments with your health care provider. °SEEK MEDICAL CARE IF: °· Your skin is pale or bluish in color. °· You continue to feel nauseous or vomit. °· Your pain is getting worse and is not helped by medicine. °· You have bleeding or swelling. °· You are still  sleepy or feeling clumsy after 24 hours. °SEEK IMMEDIATE MEDICAL CARE IF: °· You develop a rash. °· You have difficulty breathing. °· You develop any type of allergic problem. °· You have a fever. °MAKE SURE YOU: °· Understand these instructions. °· Will watch your condition. °· Will get help right away if you are not doing well or get worse. °  °This information is not intended to replace advice given to you by your health care provider. Make sure you discuss any questions you have with your health care provider. °  °Document Released: 04/27/2013 Document Revised: 07/28/2014 Document Reviewed: 04/27/2013 °Elsevier Interactive Patient Education ©2016 [employer] °Bone Marrow Aspiration and Bone Marrow Biopsy, Care After °Refer to this sheet in the next few weeks. These instructions provide you with information about caring for yourself after your procedure. Your health care provider may also give you more specific instructions. Your treatment has been planned according to current medical practices, but problems sometimes occur. Call your health care provider if you have any problems or questions after your procedure. °WHAT TO EXPECT AFTER THE PROCEDURE °After your procedure, it is common to have: °· Soreness or tenderness around the puncture site. °· Bruising. °HOME CARE INSTRUCTIONS °· Take medicines only as directed by your health care provider. °· Follow your health care provider's instructions about: °¨ Puncture site care. °¨ Bandage (dressing) changes and removal. °· Bathe and shower as directed by your health care provider. °· Check your puncture site every day for signs of infection. Watch for: °¨ Redness, swelling, or pain. °¨   Fluid, blood, or pus. °· Return to your normal activities as directed by your health care provider. °· Keep all follow-up visits as directed by your health care provider. This is important. °SEEK MEDICAL CARE IF: °· You have a fever. °· You have uncontrollable bleeding. °· You have  redness, swelling, or pain at the site of your puncture. °· You have fluid, blood, or pus coming from your puncture site. °  °This information is not intended to replace advice given to you by your health care provider. Make sure you discuss any questions you have with your health care provider. °  °Document Released: 01/24/2005 Document Revised: 11/21/2014 Document Reviewed: 06/28/2014 °Elsevier Interactive Patient Education ©2016 [employer] Inc[employer] ° °

## 2016-04-25 NOTE — Progress Notes (Signed)
Dr Geroge Baseman here to see patient and family. States he has talked with Dr Irene Limbo and he will come and see patient.

## 2016-04-25 NOTE — Progress Notes (Signed)
Pt admitted to [facility]3 via w/c, report given to [facility].

## 2016-04-26 ENCOUNTER — Observation Stay (HOSPITAL_COMMUNITY): Payer: Medicare Other

## 2016-04-26 DIAGNOSIS — R791 Abnormal coagulation profile: Secondary | ICD-10-CM

## 2016-04-26 DIAGNOSIS — Z6826 Body mass index (BMI) 26.0-26.9, adult: Secondary | ICD-10-CM | POA: Diagnosis not present

## 2016-04-26 DIAGNOSIS — R222 Localized swelling, mass and lump, trunk: Secondary | ICD-10-CM | POA: Diagnosis not present

## 2016-04-26 DIAGNOSIS — R161 Splenomegaly, not elsewhere classified: Secondary | ICD-10-CM | POA: Diagnosis not present

## 2016-04-26 DIAGNOSIS — D473 Essential (hemorrhagic) thrombocythemia: Secondary | ICD-10-CM

## 2016-04-26 DIAGNOSIS — Z87891 Personal history of nicotine dependence: Secondary | ICD-10-CM | POA: Diagnosis not present

## 2016-04-26 DIAGNOSIS — C946 Myelodysplastic disease, not classified: Secondary | ICD-10-CM | POA: Diagnosis present

## 2016-04-26 DIAGNOSIS — N179 Acute kidney failure, unspecified: Secondary | ICD-10-CM | POA: Diagnosis present

## 2016-04-26 DIAGNOSIS — N183 Chronic kidney disease, stage 3 (moderate): Secondary | ICD-10-CM | POA: Diagnosis present

## 2016-04-26 DIAGNOSIS — S2020XA Contusion of thorax, unspecified, initial encounter: Secondary | ICD-10-CM | POA: Diagnosis not present

## 2016-04-26 DIAGNOSIS — T148XXA Other injury of unspecified body region, initial encounter: Secondary | ICD-10-CM | POA: Diagnosis not present

## 2016-04-26 DIAGNOSIS — G9389 Other specified disorders of brain: Secondary | ICD-10-CM | POA: Diagnosis present

## 2016-04-26 DIAGNOSIS — R0902 Hypoxemia: Secondary | ICD-10-CM | POA: Diagnosis not present

## 2016-04-26 DIAGNOSIS — I201 Angina pectoris with documented spasm: Secondary | ICD-10-CM | POA: Diagnosis not present

## 2016-04-26 DIAGNOSIS — Z7984 Long term (current) use of oral hypoglycemic drugs: Secondary | ICD-10-CM | POA: Diagnosis not present

## 2016-04-26 DIAGNOSIS — D696 Thrombocytopenia, unspecified: Secondary | ICD-10-CM | POA: Diagnosis present

## 2016-04-26 DIAGNOSIS — M7981 Nontraumatic hematoma of soft tissue: Secondary | ICD-10-CM | POA: Diagnosis present

## 2016-04-26 DIAGNOSIS — J9601 Acute respiratory failure with hypoxia: Secondary | ICD-10-CM | POA: Diagnosis present

## 2016-04-26 DIAGNOSIS — D72829 Elevated white blood cell count, unspecified: Secondary | ICD-10-CM | POA: Diagnosis not present

## 2016-04-26 DIAGNOSIS — E43 Unspecified severe protein-calorie malnutrition: Secondary | ICD-10-CM | POA: Diagnosis present

## 2016-04-26 DIAGNOSIS — R079 Chest pain, unspecified: Secondary | ICD-10-CM | POA: Diagnosis not present

## 2016-04-26 DIAGNOSIS — K219 Gastro-esophageal reflux disease without esophagitis: Secondary | ICD-10-CM | POA: Diagnosis present

## 2016-04-26 DIAGNOSIS — N189 Chronic kidney disease, unspecified: Secondary | ICD-10-CM

## 2016-04-26 DIAGNOSIS — I129 Hypertensive chronic kidney disease with stage 1 through stage 4 chronic kidney disease, or unspecified chronic kidney disease: Secondary | ICD-10-CM | POA: Diagnosis present

## 2016-04-26 DIAGNOSIS — D72824 Basophilia: Secondary | ICD-10-CM | POA: Diagnosis not present

## 2016-04-26 DIAGNOSIS — D7589 Other specified diseases of blood and blood-forming organs: Secondary | ICD-10-CM | POA: Diagnosis present

## 2016-04-26 DIAGNOSIS — D471 Chronic myeloproliferative disease: Secondary | ICD-10-CM | POA: Diagnosis not present

## 2016-04-26 DIAGNOSIS — E1122 Type 2 diabetes mellitus with diabetic chronic kidney disease: Secondary | ICD-10-CM | POA: Diagnosis present

## 2016-04-26 DIAGNOSIS — R0789 Other chest pain: Secondary | ICD-10-CM | POA: Diagnosis not present

## 2016-04-26 LAB — COMPREHENSIVE METABOLIC PANEL
ALT: 19 U/L (ref 17–63)
ANION GAP: 5 (ref 5–15)
AST: 29 U/L (ref 15–41)
Albumin: 3.2 g/dL — ABNORMAL LOW (ref 3.5–5.0)
Alkaline Phosphatase: 125 U/L (ref 38–126)
BILIRUBIN TOTAL: 0.7 mg/dL (ref 0.3–1.2)
BUN: 17 mg/dL (ref 6–20)
CHLORIDE: 107 mmol/L (ref 101–111)
CO2: 26 mmol/L (ref 22–32)
Calcium: 8.8 mg/dL — ABNORMAL LOW (ref 8.9–10.3)
Creatinine, Ser: 1.42 mg/dL — ABNORMAL HIGH (ref 0.61–1.24)
GFR, EST AFRICAN AMERICAN: 56 mL/min — AB (ref >60)
GFR, EST NON AFRICAN AMERICAN: 49 mL/min — AB (ref >60)
Glucose, Bld: 117 mg/dL — ABNORMAL HIGH (ref 65–99)
POTASSIUM: 4.3 mmol/L (ref 3.5–5.1)
Sodium: 138 mmol/L (ref 135–145)
TOTAL PROTEIN: 6.2 g/dL — AB (ref 6.5–8.1)

## 2016-04-26 LAB — CBC
HEMATOCRIT: 35 % — AB (ref 39.0–52.0)
Hemoglobin: 11.5 g/dL — ABNORMAL LOW (ref 13.0–17.0)
MCH: 29.8 pg (ref 26.0–34.0)
MCHC: 32.9 g/dL (ref 30.0–36.0)
MCV: 90.7 fL (ref 78.0–100.0)
PLATELETS: 589 10*3/uL — AB (ref 150–400)
RBC: 3.86 MIL/uL — ABNORMAL LOW (ref 4.22–5.81)
RDW: 22 % — AB (ref 11.5–15.5)
WBC: 14.2 10*3/uL — AB (ref 4.0–10.5)

## 2016-04-26 LAB — URINALYSIS, ROUTINE W REFLEX MICROSCOPIC
BILIRUBIN URINE: NEGATIVE
GLUCOSE, UA: NEGATIVE mg/dL
HGB URINE DIPSTICK: NEGATIVE
Ketones, ur: NEGATIVE mg/dL
Leukocytes, UA: NEGATIVE
Nitrite: NEGATIVE
PROTEIN: 100 mg/dL — AB
Specific Gravity, Urine: 1.011 (ref 1.005–1.030)
pH: 6 (ref 5.0–8.0)

## 2016-04-26 LAB — URINE MICROSCOPIC-ADD ON
Bacteria, UA: NONE SEEN
RBC / HPF: NONE SEEN RBC/hpf (ref 0–5)
SQUAMOUS EPITHELIAL / LPF: NONE SEEN
WBC, UA: NONE SEEN WBC/hpf (ref 0–5)

## 2016-04-26 LAB — GLUCOSE, CAPILLARY
GLUCOSE-CAPILLARY: 129 mg/dL — AB (ref 65–99)
Glucose-Capillary: 121 mg/dL — ABNORMAL HIGH (ref 65–99)

## 2016-04-26 LAB — FIBRINOGEN: FIBRINOGEN: 609 mg/dL — AB (ref 210–475)

## 2016-04-26 LAB — APTT: aPTT: 44 seconds — ABNORMAL HIGH (ref 24–36)

## 2016-04-26 MED ORDER — OXYCODONE-ACETAMINOPHEN 5-325 MG PO TABS
1.0000 | ORAL_TABLET | ORAL | Status: DC | PRN
  Administered 2016-04-26: 2 via ORAL
  Filled 2016-04-26: qty 2

## 2016-04-26 MED ORDER — OXYCODONE HCL ER 15 MG PO T12A
15.0000 mg | EXTENDED_RELEASE_TABLET | Freq: Two times a day (BID) | ORAL | Status: DC
  Administered 2016-04-26 – 2016-04-27 (×3): 15 mg via ORAL
  Filled 2016-04-26 (×3): qty 1

## 2016-04-26 MED ORDER — HYDROMORPHONE HCL 2 MG/ML IJ SOLN
2.0000 mg | INTRAMUSCULAR | Status: DC | PRN
  Administered 2016-04-26 (×4): 4 mg via INTRAVENOUS
  Administered 2016-04-27: 2 mg via INTRAVENOUS
  Administered 2016-04-27 (×2): 4 mg via INTRAVENOUS
  Administered 2016-04-27: 2 mg via INTRAVENOUS
  Filled 2016-04-26 (×4): qty 2
  Filled 2016-04-26: qty 1
  Filled 2016-04-26: qty 2
  Filled 2016-04-26: qty 1
  Filled 2016-04-26 (×2): qty 2

## 2016-04-26 MED ORDER — OXYCODONE-ACETAMINOPHEN 5-325 MG PO TABS
2.0000 | ORAL_TABLET | ORAL | Status: DC | PRN
  Administered 2016-04-27: 2 via ORAL
  Filled 2016-04-26: qty 2

## 2016-04-26 NOTE — Progress Notes (Addendum)
PROGRESS NOTE    CATLIN AYCOCK  GLO:[id number] DOB: Oct 27, 1945 DOA: 04/25/2016 PCP: [name], MD    Brief Narrative:  Zakary Kimura  is a 70 y.o. male, With history of hypertension, diabetes mellitus who came to the short stay for bone marrow biopsy, and was found to have worsening of left-sided chest pain. Patient had a CT scan done on 04/23/2016 which showed heterogenous crescentic soft tissue abnormality deep to the left trapezius muscle suggestive of hematoma. 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 He also had moderate of the shoulder on 04/16/2016 which showed mild tendinosis of the supraspinatus tendon with a small insertional interstitial tear.  Patient has noticed worsening of pain and swelling in the left posterior chest wall. Today lab work showed PTT of 43. He denies nausea vomiting or diarrhea. No shortness of breath. Denies any coughing. No fever but has been having episodes of profuse sweating intermittently    Assessment & Plan:   Principal Problem:   Hematoma Active Problems:   Abnormal partial thromboplastin time (PTT)   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HCC)   Diabetes mellitus (HCC)   CKD (chronic kidney disease) stage 3, GFR 30-59 ml/min   Mass of chest wall, left  #1 left posterior chest wall/upper back hematoma Questionable etiology. Per patient increasing size and hematoma however H&H stable. MRI pending for further evaluation to rule out chest mass. Hematology/oncology following. Patient with significant pain states IV Dilaudid lasting only 2 hours.Will place on long-acting OxyContin 15 mg twice daily. Change IV Dilaudid to 2-4 mg every 2 hours as needed for breakthrough pain. Change Percocet to 2 tablets every 4 hours as needed for breakthrough pain. If no significant improvement with pain management may need to place on a Dilaudid PCA pump and  consult palliative care for further management.  #2 leukocytosis and thrombocytosis Concern for myeloproliferative neoplasm/disorder. Patient status post bone marrow biopsy pending. Workup underway. Hematology/oncology following and appreciate input and recommendations.  #3 elevated PTT/PT Mixing study pending. Per hematology/ oncology.  #4 chronic kidney disease Stable.  #5 hypertension Stable continue lisinopril and metoprolol.  #6 diabetes mellitus Sliding scale insulin.   DVT prophylaxis: SCDs Code Status: Full Family Communication: Updated patient. No family at bedside. Disposition Plan: Home pending hematology/oncology evaluation and pain management. Per oncology.   Consultants:   Oncology: Dr. Irene Limbo 04/25/2016  Procedures:   CT biopsy done as outpatient 04/25/2016 per Dr. Geroge Baseman  Antimicrobials:   None   Subjective: Patient complaining of left upper back pain which is not controlled. Patient states IV Dilaudid pain medication on the last approximately 2 hours. Patient denies any shortness of breath. Patient with some significant discomfort.  Objective: Vitals:   04/25/16 1618 04/25/16 2040  BP: (!) 150/72 (!) 153/70  Pulse: 60 68  Resp: 16 17  Temp: 98.3 F (36.8 C) 98 F (36.7 C)  TempSrc: Oral Oral  SpO2: 94% 98%  Weight: 89.4 kg (197 lb)   Height: 6' (1.829 m)     Intake/Output Summary (Last 24 hours) at 04/26/16 1247 Last data filed at 04/25/16 1737  Gross per 24 hour  Intake           351.58 ml  Output              275 ml  Net            76.58 ml   Filed Weights   04/25/16 1618  Weight:  89.4 kg (197 lb)    Examination:  General exam: In some moderate distress and uncomfortable.  Respiratory system: Clear to auscultation. Respiratory effort normal. Cardiovascular system: S1 & S2 heard, RRR. No JVD, murmurs, rubs, gallops or clicks. No pedal edema. Gastrointestinal system: Abdomen is nondistended, soft and nontender. No organomegaly  or masses felt. Normal bowel sounds heard. Central nervous system: Alert and oriented. No focal neurological deficits. Extremities: Symmetric 5 x 5 power. Skin: Left upper back/scapular region with tender soft tissue swelling. No rashes, lesions or ulcers Psychiatry: Judgement and insight appear normal. Mood & affect appropriate.     Data Reviewed: I have personally reviewed following labs and imaging studies  CBC:  Recent Labs Lab 04/23/16 1154 2016-05-08 0915 04/26/16 0423  WBC 17.4* 17.4* 14.2*  HGB 12.2* 11.8* 11.5*  HCT 38.1* 36.0* 35.0*  MCV 90.7 89.8 90.7  PLT 648* 675* 637*   Basic Metabolic Panel:  Recent Labs Lab 04/23/16 1154 04/26/16 0423  NA 136 138  K 4.7 4.3  CL 103 107  CO2 27 26  GLUCOSE 111* 117*  BUN 16 17  CREATININE 1.61* 1.42*  CALCIUM 8.8* 8.8*   GFR: Estimated Creatinine Clearance: 53.1 mL/min (by C-G formula based on SCr of 1.42 mg/dL (H)). Liver Function Tests:  Recent Labs Lab 04/23/16 1154 04/26/16 0423  AST 38 29  ALT 25 19  ALKPHOS 133* 125  BILITOT 0.7 0.7  PROT 6.7 6.2*  ALBUMIN 3.5 3.2*   No results for input(s): LIPASE, AMYLASE in the last 168 hours. No results for input(s): AMMONIA in the last 168 hours. Coagulation Profile:  Recent Labs Lab 2016-05-08 0915  INR 1.09   Cardiac Enzymes: No results for input(s): CKTOTAL, CKMB, CKMBINDEX, TROPONINI in the last 168 hours. BNP (last 3 results) No results for input(s): PROBNP in the last 8760 hours. HbA1C: No results for input(s): HGBA1C in the last 72 hours. CBG:  Recent Labs Lab 05/08/2016 0859 08-May-2016 1737 2016-05-08 2223  GLUCAP 115* 167* 114*   Lipid Profile: No results for input(s): CHOL, HDL, LDLCALC, TRIG, CHOLHDL, LDLDIRECT in the last 72 hours. Thyroid Function Tests: No results for input(s): TSH, T4TOTAL, FREET4, T3FREE, THYROIDAB in the last 72 hours. Anemia Panel: No results for input(s): VITAMINB12, FOLATE, FERRITIN, TIBC, IRON, RETICCTPCT in the  last 72 hours. Sepsis Labs: No results for input(s): PROCALCITON, LATICACIDVEN in the last 168 hours.  No results found for this or any previous visit (from the past 240 hour(s)).       Radiology Studies: Ct Biopsy  Result Date: 05-08-2016 INDICATION: 70 year old male with splenomegaly and thrombocytosis and clinical concern for myeloproliferative neoplasm. EXAM: CT GUIDED BONE MARROW ASPIRATION AND CORE BIOPSY Interventional Radiologist:  Criselda Peaches, MD MEDICATIONS: None. ANESTHESIA/SEDATION: Moderate (conscious) sedation was employed during this procedure. A total of 4 milligrams versed and 200 micrograms fentanyl were administered intravenously. The patient's level of consciousness and vital signs were monitored continuously by radiology nursing throughout the procedure under my direct supervision. Total monitored sedation time: 17 minutes FLUOROSCOPY TIME:  Fluoroscopy Time: 0 minutes 0 seconds (0 mGy). COMPLICATIONS: None immediate. Estimated blood loss: <25 mL PROCEDURE: Informed written consent was obtained from the patient after a thorough discussion of the procedural risks, benefits and alternatives. All questions were addressed. Maximal Sterile Barrier Technique was utilized including caps, mask, sterile gowns, sterile gloves, sterile drape, hand hygiene and skin antiseptic. A timeout was performed prior to the initiation of the procedure. The patient was positioned prone and  non-contrast localization CT was performed of the pelvis to demonstrate the iliac marrow spaces. Maximal barrier sterile technique utilized including caps, mask, sterile gowns, sterile gloves, large sterile drape, hand hygiene, and betadine prep. Under sterile conditions and local anesthesia, an 11 gauge coaxial bone biopsy needle was advanced into the right iliac marrow space. Needle position was confirmed with CT imaging. Initially, bone marrow aspiration was performed. Next, the 11 gauge outer cannula was  utilized to obtain a right iliac bone marrow core biopsy. Needle was removed. Hemostasis was obtained with compression. The patient tolerated the procedure well. Samples were prepared with the cytotechnologist. IMPRESSION: Technically successful CT-guided core biopsy of the right iliac bone. Electronically Signed   By: Jacqulynn Cadet M.D.   On: 04/25/2016 12:47        Scheduled Meds: . DULoxetine  120 mg Oral Daily  . insulin aspart  0-9 Units Subcutaneous TID WC  . lisinopril  20 mg Oral Daily  . metoprolol succinate  25 mg Oral Daily  . pantoprazole  40 mg Oral Daily   Continuous Infusions: . sodium chloride 75 mL/hr at 04/26/16 1012     LOS: 1 day    Time spent: 66 minutes    Shadasia Oldfield, MD [facility] Pager [phone]  If 7PM-7AM, please contact night-coverage [email or web address] Password [email or web address] 04/26/2016, 12:47 PM

## 2016-04-26 NOTE — Progress Notes (Signed)
LASTER APPLING   DOB:1946-07-19   JG#:[id number]   MLY#:[id number]  Hematology and oncology follow-up note  Subjective: I'm covering for Dr. Irene Limbo to see pt. he was admitted yesterday for severe chest pain from hematoma. He states he has been in severe pain from the hematoma at the left shoulder blade, which has slightly increased from yesterday. He is on Percocet and IV Dilaudid every 6 hours, but his pain is poorly controlled. He was not able to sleep last night. He has been sweating, very anxious and uncomfortable. His hemoglobin has been stable from yesterday. Coagulation lab still pending.   Objective:  Vitals:   04/25/16 2040 04/26/16 1418  BP: (!) 153/70 (!) 154/58  Pulse: 68 63  Resp: 17 16  Temp: 98 F (36.7 C) 97.9 F (36.6 C)    Body mass index is 26.72 kg/m.  Intake/Output Summary (Last 24 hours) at 04/26/16 1512 Last data filed at 04/26/16 1419  Gross per 24 hour  Intake           591.58 ml  Output             1375 ml  Net          -783.42 ml     Sclerae unicteric  Oropharynx clear  No peripheral adenopathy  Lungs clear -- no rales or rhonchi  Heart regular rate and rhythm  Abdomen benign  MSK no focal spinal tenderness, no peripheral edema, there is a subcutaneous hematoma below the left scapula area, size of my hand, tender. No ecchymosis.  Neuro nonfocal   CBG (last 3)   Recent Labs  04/25/16 0859 04/25/16 1737 04/25/16 2223  GLUCAP 115* 167* 114*     Labs:  Lab Results  Component Value Date   WBC 14.2 (H) 04/26/2016   HGB 11.5 (L) 04/26/2016   HCT 35.0 (L) 04/26/2016   MCV 90.7 04/26/2016   PLT 589 (H) 04/26/2016   NEUTROABS 6.4 04/09/2016   CMP Latest Ref Rng & Units 04/26/2016 04/23/2016 04/16/2016  Glucose 65 - 99 mg/dL 117(H) 111(H) 134(H)  BUN 6 - 20 mg/dL _0 Creatinine 0.61 - 1.24 mg/dL 1.42(H) 1.61(H) 1.47(H)  Sodium 135 - 145 mmol/L 138 136 136  Potassium 3.5 - 5.1 mmol/L 4.3 4.7 3.9  Chloride 101 - 111 mmol/L 107 103 103  CO2  22 - 32 mmol/L _1 Calcium 8.9 - 10.3 mg/dL 8.8(L) 8.8(L) 8.1(L)  Total Protein 6.5 - 8.1 g/dL 6.2(L) 6.7 -  Total Bilirubin 0.3 - 1.2 mg/dL 0.7 0.7 -  Alkaline Phos 38 - 126 U/L 125 133(H) -  AST 15 - 41 U/L 29 38 -  ALT 17 - 63 U/L 19 25 -    Urine Studies No results for input(s): UHGB, CRYS in the last 72 hours.  Invalid input(s): UACOL, UAPR, USPG, UPH, UTP, UGL, UKET, UBIL, UNIT, UROB, ULEU, UEPI, UWBC, URBC, UBAC, CAST, UCOM, BILUA  Basic Metabolic Panel:  Recent Labs Lab 04/23/16 1154 04/26/16 0423  NA 136 138  K 4.7 4.3  CL 103 107  CO2 27 26  GLUCOSE 111* 117*  BUN 16 17  CREATININE 1.61* 1.42*  CALCIUM 8.8* 8.8*   GFR Estimated Creatinine Clearance: 53.1 mL/min (by C-G formula based on SCr of 1.42 mg/dL (H)). Liver Function Tests:  Recent Labs Lab 04/23/16 1154 04/26/16 0423  AST 38 29  ALT 25 19  ALKPHOS 133* 125  BILITOT 0.7 0.7  PROT 6.7 6.2*  ALBUMIN  3.5 3.2*   No results for input(s): LIPASE, AMYLASE in the last 168 hours. No results for input(s): AMMONIA in the last 168 hours. Coagulation profile  Recent Labs Lab 04/25/16 0915  INR 1.09    CBC:  Recent Labs Lab 04/23/16 1154 04/25/16 0915 04/26/16 0423  WBC 17.4* 17.4* 14.2*  HGB 12.2* 11.8* 11.5*  HCT 38.1* 36.0* 35.0*  MCV 90.7 89.8 90.7  PLT 648* 675* 589*   Cardiac Enzymes: No results for input(s): CKTOTAL, CKMB, CKMBINDEX, TROPONINI in the last 168 hours. BNP: Invalid input(s): POCBNP CBG:  Recent Labs Lab 04/25/16 0859 04/25/16 1737 04/25/16 2223  GLUCAP 115* 167* 114*   D-Dimer No results for input(s): DDIMER in the last 72 hours. Hgb A1c No results for input(s): HGBA1C in the last 72 hours. Lipid Profile No results for input(s): CHOL, HDL, LDLCALC, TRIG, CHOLHDL, LDLDIRECT in the last 72 hours. Thyroid function studies No results for input(s): TSH, T4TOTAL, T3FREE, THYROIDAB in the last 72 hours.  Invalid input(s): FREET3 Anemia work up No results  for input(s): VITAMINB12, FOLATE, FERRITIN, TIBC, IRON, RETICCTPCT in the last 72 hours. Microbiology No results found for this or any previous visit (from the past 240 hour(s)).    Studies:  Ct Biopsy  Result Date: 04/25/2016 INDICATION: 70 year old male with splenomegaly and thrombocytosis and clinical concern for myeloproliferative neoplasm. EXAM: CT GUIDED BONE MARROW ASPIRATION AND CORE BIOPSY Interventional Radiologist:  Criselda Peaches, MD MEDICATIONS: None. ANESTHESIA/SEDATION: Moderate (conscious) sedation was employed during this procedure. A total of 4 milligrams versed and 200 micrograms fentanyl were administered intravenously. The patient's level of consciousness and vital signs were monitored continuously by radiology nursing throughout the procedure under my direct supervision. Total monitored sedation time: 17 minutes FLUOROSCOPY TIME:  Fluoroscopy Time: 0 minutes 0 seconds (0 mGy). COMPLICATIONS: None immediate. Estimated blood loss: <25 mL PROCEDURE: Informed written consent was obtained from the patient after a thorough discussion of the procedural risks, benefits and alternatives. All questions were addressed. Maximal Sterile Barrier Technique was utilized including caps, mask, sterile gowns, sterile gloves, sterile drape, hand hygiene and skin antiseptic. A timeout was performed prior to the initiation of the procedure. The patient was positioned prone and non-contrast localization CT was performed of the pelvis to demonstrate the iliac marrow spaces. Maximal barrier sterile technique utilized including caps, mask, sterile gowns, sterile gloves, large sterile drape, hand hygiene, and betadine prep. Under sterile conditions and local anesthesia, an 11 gauge coaxial bone biopsy needle was advanced into the right iliac marrow space. Needle position was confirmed with CT imaging. Initially, bone marrow aspiration was performed. Next, the 11 gauge outer cannula was utilized to obtain a  right iliac bone marrow core biopsy. Needle was removed. Hemostasis was obtained with compression. The patient tolerated the procedure well. Samples were prepared with the cytotechnologist. IMPRESSION: Technically successful CT-guided core biopsy of the right iliac bone. Electronically Signed   By: Jacqulynn Cadet M.D.   On: 04/25/2016 12:47    Assessment: 70 y.o. male   1. 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 Left posterior chest spontaneous hematoma, with severe pain 3. Elevated PTT, normal PT, workup pending 4. CKD    Plan:  -Per patient, his left posterior chest wall hematoma has increased in size but his H/H has been stable since yesterday. I have marketed the edge of his hematoma this morning, please follow up closely  -I spoke  with interventional radiologist Dr. Levan Hurst, who suggested close clinical follow-up. If patient does have severe active bleeding, he recommended to get a CTA of the chest to see if he would benefit from embolization -Chest MRI has been ordered, to rule out chest wall mass, hopefully will be done asap -I discussed with hospitalist Dr. Marcello Moores about his pain management. I suggest PTA and palliaitve consult -I called lab to follow his coagulation lab results. Lab was drawn this morning, but did not arrive [facility] before noon, so this will be done on Monday, and results will be back on Tuesday -given his non-life threatening bleeding, I will hold on any blood products or treatment at this point, given the pending lab result . -His leukocytosis and thrombocytosis are mild to moderate, I do not think he needs hydrea for now, will wait for his bone marrow biopsy on Monday   I will follow up tomorrow   Truitt Merle, MD 04/26/2016  3:12 PM

## 2016-04-27 ENCOUNTER — Inpatient Hospital Stay (HOSPITAL_COMMUNITY): Payer: Medicare Other

## 2016-04-27 DIAGNOSIS — I201 Angina pectoris with documented spasm: Secondary | ICD-10-CM

## 2016-04-27 DIAGNOSIS — J9601 Acute respiratory failure with hypoxia: Secondary | ICD-10-CM | POA: Diagnosis not present

## 2016-04-27 DIAGNOSIS — R0902 Hypoxemia: Secondary | ICD-10-CM

## 2016-04-27 LAB — CBC WITH DIFFERENTIAL/PLATELET
BASOS ABS: 0.3 10*3/uL — AB (ref 0.0–0.1)
Band Neutrophils: 9 %
Basophils Relative: 2 %
Eosinophils Absolute: 0 10*3/uL (ref 0.0–0.7)
Eosinophils Relative: 0 %
HCT: 35.3 % — ABNORMAL LOW (ref 39.0–52.0)
Hemoglobin: 11.8 g/dL — ABNORMAL LOW (ref 13.0–17.0)
Lymphocytes Relative: 13 %
Lymphs Abs: 1.7 10*3/uL (ref 0.7–4.0)
MCH: 29.6 pg (ref 26.0–34.0)
MCHC: 33.4 g/dL (ref 30.0–36.0)
MCV: 88.7 fL (ref 78.0–100.0)
MYELOCYTES: 5 %
Metamyelocytes Relative: 4 %
Monocytes Absolute: 0.1 10*3/uL (ref 0.1–1.0)
Monocytes Relative: 1 %
NEUTROS ABS: 10.6 10*3/uL — AB (ref 1.7–7.7)
NEUTROS PCT: 59 %
Other: 5 %
PLATELETS: 715 10*3/uL — AB (ref 150–400)
PROMYELOCYTES ABS: 2 %
RBC: 3.98 MIL/uL — AB (ref 4.22–5.81)
RDW: 22.4 % — AB (ref 11.5–15.5)
WBC: 13.4 10*3/uL — AB (ref 4.0–10.5)

## 2016-04-27 LAB — BLOOD GAS, ARTERIAL
Acid-base deficit: 0.6 mmol/L (ref 0.0–2.0)
Bicarbonate: 25.2 mmol/L (ref 20.0–28.0)
DRAWN BY: 441261
O2 Content: 4 L/min
O2 Saturation: 91.5 %
PCO2 ART: 49.1 mmHg — AB (ref 32.0–48.0)
PH ART: 7.331 — AB (ref 7.350–7.450)
Patient temperature: 98.6
pO2, Arterial: 70.2 mmHg — ABNORMAL LOW (ref 83.0–108.0)

## 2016-04-27 LAB — BASIC METABOLIC PANEL
Anion gap: 6 (ref 5–15)
BUN: 21 mg/dL — ABNORMAL HIGH (ref 6–20)
CALCIUM: 8.7 mg/dL — AB (ref 8.9–10.3)
CO2: 25 mmol/L (ref 22–32)
CREATININE: 1.49 mg/dL — AB (ref 0.61–1.24)
Chloride: 107 mmol/L (ref 101–111)
GFR, EST AFRICAN AMERICAN: 53 mL/min — AB (ref >60)
GFR, EST NON AFRICAN AMERICAN: 46 mL/min — AB (ref >60)
Glucose, Bld: 197 mg/dL — ABNORMAL HIGH (ref 65–99)
Potassium: 4.5 mmol/L (ref 3.5–5.1)
SODIUM: 138 mmol/L (ref 135–145)

## 2016-04-27 LAB — HEMOGLOBIN A1C
Hgb A1c MFr Bld: 6 % — ABNORMAL HIGH (ref 4.8–5.6)
MEAN PLASMA GLUCOSE: 126 mg/dL

## 2016-04-27 LAB — TROPONIN I

## 2016-04-27 LAB — URINE CULTURE: CULTURE: NO GROWTH

## 2016-04-27 LAB — GLUCOSE, CAPILLARY
GLUCOSE-CAPILLARY: 138 mg/dL — AB (ref 65–99)
GLUCOSE-CAPILLARY: 133 mg/dL — AB (ref 65–99)
Glucose-Capillary: 135 mg/dL — ABNORMAL HIGH (ref 65–99)
Glucose-Capillary: 118 mg/dL — ABNORMAL HIGH (ref 65–99)

## 2016-04-27 LAB — FACTOR 9 ASSAY: Coagulation Factor IX: 126 % (ref 60–177)

## 2016-04-27 LAB — FACTOR 8 ASSAY: Coagulation Factor VIII: 248 % — ABNORMAL HIGH (ref 57–163)

## 2016-04-27 MED ORDER — ENSURE ENLIVE PO LIQD
237.0000 mL | Freq: Two times a day (BID) | ORAL | Status: DC
  Administered 2016-04-28 – 2016-05-02 (×8): 237 mL via ORAL

## 2016-04-27 MED ORDER — DIPHENHYDRAMINE HCL 50 MG/ML IJ SOLN: 12.5000 mg | Freq: Four times a day (QID) | INTRAMUSCULAR | Status: DC | PRN

## 2016-04-27 MED ORDER — NALOXONE HCL 0.4 MG/ML IJ SOLN: 0.4000 mg | INTRAMUSCULAR | Status: DC | PRN

## 2016-04-27 MED ORDER — KETOROLAC TROMETHAMINE 30 MG/ML IJ SOLN
30.0000 mg | Freq: Once | INTRAMUSCULAR | Status: AC
  Administered 2016-04-27: 30 mg via INTRAVENOUS
  Filled 2016-04-27: qty 1

## 2016-04-27 MED ORDER — OXYCODONE HCL ER 20 MG PO T12A: 20.0000 mg | EXTENDED_RELEASE_TABLET | Freq: Two times a day (BID) | ORAL | Status: DC

## 2016-04-27 MED ORDER — ONDANSETRON HCL 4 MG/2ML IJ SOLN: 4.0000 mg | Freq: Four times a day (QID) | INTRAMUSCULAR | Status: DC | PRN

## 2016-04-27 MED ORDER — SODIUM CHLORIDE 0.9% FLUSH: 9.0000 mL | INTRAVENOUS | Status: DC | PRN

## 2016-04-27 MED ORDER — HYDROMORPHONE 1 MG/ML IV SOLN
INTRAVENOUS | Status: DC
  Administered 2016-04-27: 13:00:00 via INTRAVENOUS
  Administered 2016-04-27: 3.9 mg via INTRAVENOUS
  Administered 2016-04-27: 2.6 mg via INTRAVENOUS
  Administered 2016-04-28: 0.8 mg via INTRAVENOUS
  Administered 2016-04-28: 2.2 mg via INTRAVENOUS
  Administered 2016-04-28: 3.2 mg via INTRAVENOUS
  Filled 2016-04-27: qty 25

## 2016-04-27 MED ORDER — DIPHENHYDRAMINE HCL 12.5 MG/5ML PO ELIX: 12.5000 mg | ORAL_SOLUTION | Freq: Four times a day (QID) | ORAL | Status: DC | PRN

## 2016-04-27 MED ORDER — FAMOTIDINE 20 MG PO TABS
20.0000 mg | ORAL_TABLET | Freq: Once | ORAL | Status: AC
  Administered 2016-04-27: 20 mg via ORAL
  Filled 2016-04-27: qty 1

## 2016-04-27 MED ORDER — GI COCKTAIL ~~LOC~~: 30.0000 mL | Freq: Three times a day (TID) | ORAL | Status: DC | PRN

## 2016-04-27 MED ORDER — HYDROMORPHONE HCL 2 MG/ML IJ SOLN
2.0000 mg | INTRAMUSCULAR | Status: DC | PRN
  Administered 2016-04-27: 2 mg via INTRAVENOUS
  Filled 2016-04-27: qty 1

## 2016-04-27 NOTE — Progress Notes (Signed)
Initial Nutrition Assessment  DOCUMENTATION CODES:   Severe malnutrition in context of acute illness/injury  INTERVENTION:  Ensure Enlive po BID, each supplement provides 350 kcal and 20 grams of protein.  RD will continue to monitor intake and nutrition-related needs.  NUTRITION DIAGNOSIS:   Malnutrition (Severe) related to acute illness as evidenced by energy intake < or equal to 50% for > or equal to 5 days, 10 percent weight loss in 1 month per patient.  GOAL:   Patient will meet greater than or equal to 90% of their needs  MONITOR:   PO intake, Supplement acceptance, Weight trends, Labs, I & O's  REASON FOR ASSESSMENT:   Malnutrition Screening Tool    ASSESSMENT:   70 y.o.male,With history of hypertension, diabetes mellitus who came to the short stay for bone marrow biopsy, and was found to have worsening of left-sided chest pain. Patient had a CT scan done on 04/23/2016 which showed heterogenous crescentic soft tissue abnormality deep to the left trapezius muscle suggestive of hematoma.   Patient reports he has had poor appetite for the past few weeks. He has been eating only 25-50% of usual intake because he can only eat about 1 meal per day. He reports his UBW is 220 lbs and he weighed this only one month ago. Per pt he has lost 23 lbs (10% body weight) over 1 month, which is significant for time frame. This severe weight loss is not reflected in chart.   Meal Completion: 40-75% per chart  Medications reviewed and include: Novolog sliding scale TID with meals, pantoprazole.  Labs reviewed: CBG 121-138 past 24 hrs, BUN 21, Creatinine 1.49, HgbA1c 6.  Nutrition-Focused physical exam completed. Findings are severe fat depletion, mild muscle depletion, and no edema. Patient with very low fat stores. Some muscle depletion noted - mostly upper body.   Patient meets criteria for severe acute malnutrition due to 10% weight loss in 1 month and intake </= 50% for >/= 5 days  per patient report.   Diet Order:  Diet Heart Room service appropriate? Yes; Fluid consistency: Thin  Skin:  Reviewed, no issues  Last BM:  04/25/2016  Height:   Ht Readings from Last 1 Encounters:  04/25/16 6' (1.829 m)    Weight:   Wt Readings from Last 1 Encounters:  04/25/16 197 lb (89.4 kg)    Ideal Body Weight:  80.91 kg  BMI:  Body mass index is 26.72 kg/m.  Estimated Nutritional Needs:   Kcal:  2000-2200  Protein:  90-110 grams  Fluid:  2-2.2 L/day  EDUCATION NEEDS:   No education needs identified at this time  Willey Blade, MS, RD, LDN Pager: [phone] After Hours Pager: [phone]

## 2016-04-27 NOTE — Progress Notes (Signed)
[name]   DOB:December 19, 1945   NW#:[id number]   MVH#:[id number]  Hematology and oncology follow-up note  Subjective: Pt developed breast very stressed and desaturation when he was getting IV Dilaudid 4 mg every 4 hours last night, respiratory issue resolved after that or his dose reduction. However patient complains severe, debilitating left chest wall pain from the hematoma, which appears to be slightly increased from yesterday, his H&H has been stable, no other new complaints.  Objective:  Vitals:   04/27/16 0754 04/27/16 1500  BP: (!) 176/88 (!) 158/77  Pulse: 74 72  Resp: 16 16  Temp:  98.2 F (36.8 C)    Body mass index is 26.72 kg/m.  Intake/Output Summary (Last 24 hours) at 04/27/16 1535 Last data filed at 04/27/16 1500  Gross per 24 hour  Intake              240 ml  Output                0 ml  Net              240 ml     Sclerae unicteric  Oropharynx clear  No peripheral adenopathy  Lungs clear -- no rales or rhonchi  Heart regular rate and rhythm  Abdomen benign  MSK no focal spinal tenderness, no peripheral edema, there is a subcutaneous hematoma below the left scapula area, size of my hand, tender. No ecchymosis. Size slightly increased from yesterday, I marked it  Neuro nonfocal   CBG (last 3)   Recent Labs  04/26/16 2144 04/27/16 0803 04/27/16 1219  GLUCAP 121* 138* 133*     Labs:  Lab Results  Component Value Date   WBC 13.4 (H) 04/27/2016   HGB 11.8 (L) 04/27/2016   HCT 35.3 (L) 04/27/2016   MCV 88.7 04/27/2016   PLT 715 (H) 04/27/2016   NEUTROABS 10.6 (H) 04/27/2016   CMP Latest Ref Rng & Units 04/27/2016 04/26/2016 04/23/2016  Glucose 65 - 99 mg/dL 197(H) 117(H) 111(H)  BUN 6 - 20 mg/dL 21(H) 17 16  Creatinine 0.61 - 1.24 mg/dL 1.49(H) 1.42(H) 1.61(H)  Sodium 135 - 145 mmol/L 138 138 136  Potassium 3.5 - 5.1 mmol/L 4.5 4.3 4.7  Chloride 101 - 111 mmol/L 107 107 103  CO2 22 - 32 mmol/L '25 26 27  '$ Calcium 8.9 - 10.3 mg/dL 8.7(L) 8.8(L)  8.8(L)  Total Protein 6.5 - 8.1 g/dL - 6.2(L) 6.7  Total Bilirubin 0.3 - 1.2 mg/dL - 0.7 0.7  Alkaline Phos 38 - 126 U/L - 125 133(H)  AST 15 - 41 U/L - 29 38  ALT 17 - 63 U/L - 19 25    Urine Studies No results for input(s): UHGB, CRYS in the last 72 hours.  Invalid input(s): UACOL, UAPR, USPG, UPH, UTP, UGL, UKET, UBIL, UNIT, UROB, ULEU, UEPI, UWBC, URBC, UBAC, CAST, [facility], [place]  Basic Metabolic Panel:  Recent Labs Lab 04/23/16 1154 04/26/16 0423 04/27/16 0413  NA 136 138 138  K 4.7 4.3 4.5  CL 103 107 107  CO2 '27 26 25  '$ GLUCOSE 111* 117* 197*  BUN 16 17 21*  CREATININE 1.61* 1.42* 1.49*  CALCIUM 8.8* 8.8* 8.7*   GFR Estimated Creatinine Clearance: 50.6 mL/min (by C-G formula based on SCr of 1.49 mg/dL (H)). Liver Function Tests:  Recent Labs Lab 04/23/16 1154 04/26/16 0423  AST 38 29  ALT 25 19  ALKPHOS 133* 125  BILITOT 0.7 0.7  PROT 6.7 6.2*  ALBUMIN  3.5 3.2*   No results for input(s): LIPASE, AMYLASE in the last 168 hours. No results for input(s): AMMONIA in the last 168 hours. Coagulation profile  Recent Labs Lab 04/25/16 0915  INR 1.09    CBC:  Recent Labs Lab 04/23/16 1154 04/25/16 0915 04/26/16 0423 04/27/16 0413  WBC 17.4* 17.4* 14.2* 13.4*  NEUTROABS  --   --   --  10.6*  HGB 12.2* 11.8* 11.5* 11.8*  HCT 38.1* 36.0* 35.0* 35.3*  MCV 90.7 89.8 90.7 88.7  PLT 648* 675* 589* 715*   Cardiac Enzymes:  Recent Labs Lab 04/27/16 0908  TROPONINI <0.03   BNP: Invalid input(s): POCBNP CBG:  Recent Labs Lab 04/25/16 2223 04/26/16 1658 04/26/16 2144 04/27/16 0803 04/27/16 1219  GLUCAP 114* 129* 121* 138* 133*   D-Dimer No results for input(s): DDIMER in the last 72 hours. Hgb A1c  Recent Labs  04/26/16 0423  HGBA1C 6.0*   Lipid Profile No results for input(s): CHOL, HDL, LDLCALC, TRIG, CHOLHDL, LDLDIRECT in the last 72 hours. Thyroid function studies No results for input(s): TSH, T4TOTAL, T3FREE, THYROIDAB in the last  72 hours.  Invalid input(s): FREET3 Anemia work up No results for input(s): VITAMINB12, FOLATE, FERRITIN, TIBC, IRON, RETICCTPCT in the last 72 hours. Microbiology Recent Results (from the past 240 hour(s))  Culture, Urine     Status: None   Collection Time: 04/26/16 11:05 AM  Result Value Ref Range Status   Specimen Description URINE, RANDOM  Final   Special Requests NONE  Final   Culture NO GROWTH Performed at [facility]   Final   Report Status 04/27/2016 FINAL  Final      Studies:  Dg Chest 1 View  Result Date: 04/27/2016 CLINICAL DATA:  Hypoxia.  Hypertension. EXAM: CHEST 1 VIEW COMPARISON:  April 15, 2016 chest radiograph and chest CT April 23, 2016 FINDINGS: There is no edema or consolidation. The heart size and pulmonary vascularity are normal. No adenopathy. There is atherosclerotic calcification aorta. No bone lesions. IMPRESSION: Aortic atherosclerosis.  No edema or consolidation. Electronically Signed   By: Lowella Grip III M.D.   On: 04/27/2016 07:13   Mr Chest Wo Contrast  Result Date: 04/27/2016 CLINICAL DATA:  Evaluate left chest wall mass. EXAM: MRI CHEST WITHOUT CONTRAST TECHNIQUE: Multiplanar, multisequence MR imaging was performed. No intravenous contrast was administered. COMPARISON:  Chest CT 04/23/2016 FINDINGS: The chest wall mass identified on the chest CT demonstrates increased T1 signal intensity most consistent with a hematoma. The actual hematoma measures a maximum of 8 x 3 cm. This likely arising in the serratus anterior muscle but there is also some hematoma outside the muscle. There is significant surrounding fluid and edema which is involving the latissimus dorsi muscle also. No scapular fracture or rib fracture. The visualized left lung is grossly clear.  No rib lesions. IMPRESSION: Large left-sided chest wall hematoma with surrounding fluid, edema and inflammation. No underlying scapular or left rib fracture. Electronically Signed   By:  Marijo Sanes M.D.   On: 04/27/2016 13:03    Assessment: 70 y.o. male   1. 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 Left posterior chest spontaneous hematoma, with severe pain 3. Elevated PTT, normal PT, workup pending 4. CKD    Plan:  -His fact 8 and a 9 level are normal, PTT mixing study and one will prevent panel results are still pending, likely come back  tomorrow to stay -unlikely factor VIII inhibitor  -pending bone marrow biopsy revealed by Dr. Diamantina Monks tomorrow -His chest wall hematoma overall stable, hemoglobin did not further drop since admission. Chest MRI showed large left-sided chest wall hematoma with surrounding fluid, edema and inflammation. No underlying mass or rib fracture. -His main issue is an control for now, patient refuses to see palliative medicine to titrate his pain medication. He was overdosed with IV Dilaudid and had respiratory distress and desaturation. I spoke with Dr. Grandville Silos, and suggest low dose dilaudid pump  -Dr. Irene Limbo will resume care tomorrow    Truitt Merle, MD 04/27/2016  3:35 PM

## 2016-04-27 NOTE — Progress Notes (Addendum)
Pt was given IV Dilaudid at 2107. Patient appeared lethargic so vitals were taken. Pt O2 was low in high 60's and low 70's. Respirations steady at 18 per minute. O2 per [place] titrated up to 4L to get above 92%. Pt was also educated on deep breathing and using IS.  Notified the on call Dr. Laren Everts about abnormal vitals. Got an order for continuous monitoring and discussed with the Dr about using lower dose of pain medication available with a longer time interval in between each dose. Will continue to monitor.  0133 Paged On call Dr due to the need for a simple mask at 5L to maintain O2 at 90% and above. Pt states no shortness of breath, chest pain, or increased anxiety. Will continue to monitor.

## 2016-04-27 NOTE — Progress Notes (Signed)
PROGRESS NOTE    [name]  ZHG:[id number] DOB: May 18, 1946 DOA: 04/25/2016 PCP: [name], MD    Brief Narrative:  [name]  is a 70 y.o. male, With history of hypertension, diabetes mellitus who came to the short stay for bone marrow biopsy, and was found to have worsening of left-sided chest pain. Patient had a CT scan done on 04/23/2016 which showed heterogenous crescentic soft tissue abnormality deep to the left trapezius muscle suggestive of hematoma. 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 He also had moderate of the shoulder on 04/16/2016 which showed mild tendinosis of the supraspinatus tendon with a small insertional interstitial tear.  Patient has noticed worsening of pain and swelling in the left posterior chest wall. Today lab work showed PTT of 43. He denies nausea vomiting or diarrhea. No shortness of breath. Denies any coughing. No fever but has been having episodes of profuse sweating intermittently    Assessment & Plan:   Principal Problem:   Hematoma Active Problems:   Abnormal partial thromboplastin time (PTT)   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HCC)   Diabetes mellitus (HCC)   Chest pain   CKD (chronic kidney disease) stage 3, GFR 30-59 ml/min   Mass of chest wall, left   Acute respiratory failure with hypoxia (HCC)  #1 left posterior chest wall/upper back hematoma Questionable etiology. Per patient increasing size and hematoma however H&H stable. MRI was done yesterday however results pending for further evaluation to rule out chest mass. Hematology/oncology following. Patient with significant pain yesterday and IV Dilaudid dose was adjusted to 2-4 mg every 2 hours as needed. Patient also maintained on OxyContin 15 mg twice daily. Patient also on Percocet 2 tablets every 4 hours as needed for breakthrough pain. Patient noted to be hypoxic  overnight likely secondary to respiratory depression from multiple doses of 4 mg IV Dilaudid. We'll decrease IV Dilaudid to 2 mg every 3 hours as needed. Monitor closely for respiratory depression.   #2 acute respiratory distress with hypoxia Likely secondary to respiratory depression from narcotic pain medication. Patient alert speaking in full sentences and not in extremis. Patient currently on Venturi mask. Check ABG. Chest x-ray unremarkable. Decrease dose of IV Dilaudid breakthrough regimen.  #3 chest pain Patient with some complaints of chest pain with both typical and atypical features. Currently chest pain-free. EKG with no significant ischemic changes noted. Chest x-ray unremarkable. Will cycle cardiac enzymes every 6 hours 3. Continue PPI. GI cocktail when necessary. If cardiac enzymes are elevated will check a 2-D echo and consult with cardiology. Follow.  #4 leukocytosis and thrombocytosis Concern for myeloproliferative neoplasm/disorder. Patient status post bone marrow biopsy pending. Workup underway. Hematology/oncology following and appreciate input and recommendations.  #5 elevated PTT/PT Mixing study pending. Per hematology/ oncology.  #6 chronic kidney disease Stable.  #7 hypertension Blood pressure elevated this morning however patient has not received antihypertensive medications yet. Continue lisinopril and metoprolol.  #8 diabetes mellitus CBGs have ranged from  114-129. Sliding scale insulin.   DVT prophylaxis: SCDs Code Status: Full Family Communication: Updated patient. No family at bedside. Disposition Plan: Home pending hematology/oncology evaluation and pain management. Per oncology.   Consultants:   Oncology: Dr. Irene Limbo 04/25/2016  Procedures:   CT biopsy done as outpatient 04/25/2016 per Dr. Geroge Baseman  MRI chest 04/26/2016 pending  Antimicrobials:   None   Subjective: Patient noted overnight to be hypoxic with sats troponin the 60 to 70s per  nursing. Patient currently on Venturi mask. This morning patient with complaints of midsternal chest pressure nonradiating which has subsequently resolved. Patient states felt it might have been some component of heartburn. Patient denies any pleuritic pain. No shortness of breath. Patient alert. Speaking in full sentences.  Objective: Vitals:   04/27/16 0045 04/27/16 0300 04/27/16 0330 04/27/16 0754  BP:  (!) 185/85 (!) 145/72 (!) 176/88  Pulse:    74  Resp:    16  Temp:      TempSrc:      SpO2: 94%   94%  Weight:      Height:        Intake/Output Summary (Last 24 hours) at 04/27/16 0759 Last data filed at 04/26/16 1419  Gross per 24 hour  Intake              240 ml  Output             1100 ml  Net             -860 ml   Filed Weights   04/25/16 1618  Weight: 89.4 kg (197 lb)    Examination:  General exam: Laying in bed with venturi mask on.Alert. Speaking in full sentences. Respiratory system: Clear to auscultation anterior lung fields. Respiratory effort normal.Speaking in full sentences. Cardiovascular system: S1 & S2 heard, RRR. No JVD, murmurs, rubs, gallops or clicks. No pedal edema. Gastrointestinal system: Abdomen is nondistended, soft and nontender. No organomegaly or masses felt. Normal bowel sounds heard. Central nervous system: Alert and oriented. No focal neurological deficits. Extremities: Symmetric 5 x 5 power. Skin: Left upper back/scapular region with tender soft tissue swelling. No rashes, lesions or ulcers Psychiatry: Judgement and insight appear normal. Mood & affect appropriate.     Data Reviewed: I have personally reviewed following labs and imaging studies  CBC:  Recent Labs Lab 04/23/16 1154 04/25/16 0915 04/26/16 0423 04/27/16 0413  WBC 17.4* 17.4* 14.2* 13.4*  NEUTROABS  --   --   --  10.6*  HGB 12.2* 11.8* 11.5* 11.8*  HCT 38.1* 36.0* 35.0* 35.3*  MCV 90.7 89.8 90.7 88.7  PLT 648* 675* 589* 494*   Basic Metabolic Panel:  Recent  Labs Lab 04/23/16 1154 04/26/16 0423 04/27/16 0413  NA 136 138 138  K 4.7 4.3 4.5  CL 103 107 107  CO2 '27 26 25  '$ GLUCOSE 111* 117* 197*  BUN 16 17 21*  CREATININE 1.61* 1.42* 1.49*  CALCIUM 8.8* 8.8* 8.7*   GFR: Estimated Creatinine Clearance: 50.6 mL/min (by C-G formula based on SCr of 1.49 mg/dL (H)). Liver Function Tests:  Recent Labs Lab 04/23/16 1154 04/26/16 0423  AST 38 29  ALT 25 19  ALKPHOS 133* 125  BILITOT 0.7 0.7  PROT 6.7 6.2*  ALBUMIN 3.5 3.2*   No results for input(s): LIPASE, AMYLASE in the last 168 hours. No results for input(s): AMMONIA in the last 168 hours. Coagulation Profile:  Recent Labs Lab 04/25/16 0915  INR 1.09   Cardiac Enzymes: No results for input(s): CKTOTAL, CKMB, CKMBINDEX, TROPONINI in the last 168 hours. BNP (last 3 results) No results for input(s): PROBNP in the last 8760 hours. HbA1C: No results for input(s): HGBA1C in the last 72 hours. CBG:  Recent Labs Lab 04/25/16 0859 04/25/16 1737 04/25/16 2223 04/26/16 1658 04/26/16 2144  GLUCAP 115* 167* 114* 129* 121*   Lipid Profile: No results for input(s): CHOL, HDL, LDLCALC, TRIG, CHOLHDL, LDLDIRECT in the last 72 hours. Thyroid  Function Tests: No results for input(s): TSH, T4TOTAL, FREET4, T3FREE, THYROIDAB in the last 72 hours. Anemia Panel: No results for input(s): VITAMINB12, FOLATE, FERRITIN, TIBC, IRON, RETICCTPCT in the last 72 hours. Sepsis Labs: No results for input(s): PROCALCITON, LATICACIDVEN in the last 168 hours.  No results found for this or any previous visit (from the past 240 hour(s)).       Radiology Studies: Dg Chest 1 View  Result Date: 04/27/2016 CLINICAL DATA:  Hypoxia.  Hypertension. EXAM: CHEST 1 VIEW COMPARISON:  April 15, 2016 chest radiograph and chest CT April 23, 2016 FINDINGS: There is no edema or consolidation. The heart size and pulmonary vascularity are normal. No adenopathy. There is atherosclerotic calcification aorta. No  bone lesions. IMPRESSION: Aortic atherosclerosis.  No edema or consolidation. Electronically Signed   By: Lowella Grip III M.D.   On: 04/27/2016 07:13   Ct Biopsy  Result Date: 04/25/2016 INDICATION: 69 year old male with splenomegaly and thrombocytosis and clinical concern for myeloproliferative neoplasm. EXAM: CT GUIDED BONE MARROW ASPIRATION AND CORE BIOPSY Interventional Radiologist:  Criselda Peaches, MD MEDICATIONS: None. ANESTHESIA/SEDATION: Moderate (conscious) sedation was employed during this procedure. A total of 4 milligrams versed and 200 micrograms fentanyl were administered intravenously. The patient's level of consciousness and vital signs were monitored continuously by radiology nursing throughout the procedure under my direct supervision. Total monitored sedation time: 17 minutes FLUOROSCOPY TIME:  Fluoroscopy Time: 0 minutes 0 seconds (0 mGy). COMPLICATIONS: None immediate. Estimated blood loss: <25 mL PROCEDURE: Informed written consent was obtained from the patient after a thorough discussion of the procedural risks, benefits and alternatives. All questions were addressed. Maximal Sterile Barrier Technique was utilized including caps, mask, sterile gowns, sterile gloves, sterile drape, hand hygiene and skin antiseptic. A timeout was performed prior to the initiation of the procedure. The patient was positioned prone and non-contrast localization CT was performed of the pelvis to demonstrate the iliac marrow spaces. Maximal barrier sterile technique utilized including caps, mask, sterile gowns, sterile gloves, large sterile drape, hand hygiene, and betadine prep. Under sterile conditions and local anesthesia, an 11 gauge coaxial bone biopsy needle was advanced into the right iliac marrow space. Needle position was confirmed with CT imaging. Initially, bone marrow aspiration was performed. Next, the 11 gauge outer cannula was utilized to obtain a right iliac bone marrow core biopsy.  Needle was removed. Hemostasis was obtained with compression. The patient tolerated the procedure well. Samples were prepared with the cytotechnologist. IMPRESSION: Technically successful CT-guided core biopsy of the right iliac bone. Electronically Signed   By: Jacqulynn Cadet M.D.   On: 04/25/2016 12:47        Scheduled Meds: . DULoxetine  120 mg Oral Daily  . insulin aspart  0-9 Units Subcutaneous TID WC  . lisinopril  20 mg Oral Daily  . metoprolol succinate  25 mg Oral Daily  . oxyCODONE  15 mg Oral Q12H  . pantoprazole  40 mg Oral Daily   Continuous Infusions: . sodium chloride 75 mL/hr at 04/27/16 0353     LOS: 1 day    Time spent: 47 minutes    THOMPSON,DANIEL, MD [facility] Pager [phone]  If 7PM-7AM, please contact night-coverage [email or web address] Password [email or web address] 04/27/2016, 7:59 AM

## 2016-04-27 NOTE — Progress Notes (Signed)
Patient c/o chest pain and pressure with out radiating down arms. Dr Grandville Silos called, new orders given. Will continue to monitor.

## 2016-04-28 LAB — CBC WITH DIFFERENTIAL/PLATELET
BAND NEUTROPHILS: 21 %
BASOS PCT: 1 %
BLASTS: 0 %
Basophils Absolute: 0.1 10*3/uL (ref 0.0–0.1)
EOS ABS: 0.4 10*3/uL (ref 0.0–0.7)
EOS PCT: 3 %
HCT: 34.6 % — ABNORMAL LOW (ref 39.0–52.0)
Hemoglobin: 11.3 g/dL — ABNORMAL LOW (ref 13.0–17.0)
LYMPHS ABS: 2.7 10*3/uL (ref 0.7–4.0)
LYMPHS PCT: 20 %
MCH: 29.5 pg (ref 26.0–34.0)
MCHC: 32.7 g/dL (ref 30.0–36.0)
MCV: 90.3 fL (ref 78.0–100.0)
METAMYELOCYTES PCT: 2 %
MONO ABS: 0 10*3/uL — AB (ref 0.1–1.0)
MONOS PCT: 0 %
Myelocytes: 8 %
NEUTROS ABS: 9.8 10*3/uL — AB (ref 1.7–7.7)
Neutrophils Relative %: 42 %
OTHER: 3 %
PLATELETS: 546 10*3/uL — AB (ref 150–400)
Promyelocytes Absolute: 0 %
RBC: 3.83 MIL/uL — ABNORMAL LOW (ref 4.22–5.81)
RDW: 21.7 % — AB (ref 11.5–15.5)
WBC: 13.4 10*3/uL — ABNORMAL HIGH (ref 4.0–10.5)
nRBC: 1 /100 WBC — ABNORMAL HIGH

## 2016-04-28 LAB — BASIC METABOLIC PANEL
ANION GAP: 6 (ref 5–15)
BUN: 27 mg/dL — AB (ref 6–20)
CO2: 29 mmol/L (ref 22–32)
Calcium: 9 mg/dL (ref 8.9–10.3)
Chloride: 107 mmol/L (ref 101–111)
Creatinine, Ser: 1.65 mg/dL — ABNORMAL HIGH (ref 0.61–1.24)
GFR calc Af Amer: 47 mL/min — ABNORMAL LOW (ref >60)
GFR calc non Af Amer: 40 mL/min — ABNORMAL LOW (ref >60)
GLUCOSE: 105 mg/dL — AB (ref 65–99)
POTASSIUM: 4.9 mmol/L (ref 3.5–5.1)
Sodium: 142 mmol/L (ref 135–145)

## 2016-04-28 LAB — GLUCOSE, CAPILLARY
GLUCOSE-CAPILLARY: 90 mg/dL (ref 65–99)
GLUCOSE-CAPILLARY: 130 mg/dL — AB (ref 65–99)
GLUCOSE-CAPILLARY: 139 mg/dL — AB (ref 65–99)
Glucose-Capillary: 102 mg/dL — ABNORMAL HIGH (ref 65–99)
Glucose-Capillary: 97 mg/dL (ref 65–99)
Glucose-Capillary: 115 mg/dL — ABNORMAL HIGH (ref 65–99)

## 2016-04-28 LAB — BCR-ABL1, CML/ALL, PCR, QUANT

## 2016-04-28 MED ORDER — HYDROMORPHONE 1 MG/ML IV SOLN
INTRAVENOUS | Status: DC
  Administered 2016-04-28: 3.5 mg via INTRAVENOUS
  Administered 2016-04-28: 20:00:00 via INTRAVENOUS
  Administered 2016-04-28: 2.1 mg via INTRAVENOUS
  Administered 2016-04-29: 1.8 mg via INTRAVENOUS
  Administered 2016-04-29: 4.2 mg via INTRAVENOUS
  Administered 2016-04-29: 3.6 mg via INTRAVENOUS
  Filled 2016-04-28 (×2): qty 25

## 2016-04-28 MED ORDER — DIPHENHYDRAMINE HCL 50 MG/ML IJ SOLN: 12.5000 mg | Freq: Four times a day (QID) | INTRAMUSCULAR | Status: DC | PRN

## 2016-04-28 MED ORDER — DIPHENHYDRAMINE HCL 12.5 MG/5ML PO ELIX: 12.5000 mg | ORAL_SOLUTION | Freq: Four times a day (QID) | ORAL | Status: DC | PRN

## 2016-04-28 NOTE — Progress Notes (Signed)
PROGRESS NOTE    [name]  XBW:[id number] DOB: 04/29/1946 DOA: 04/25/2016 PCP: [name], MD    Brief Narrative:  [name]  is a 70 y.o. male, With history of hypertension, diabetes mellitus who came to the short stay for bone marrow biopsy, and was found to have worsening of left-sided chest pain. Patient had a CT scan done on 04/23/2016 which showed heterogenous crescentic soft tissue abnormality deep to the left trapezius muscle suggestive of hematoma. 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 He also had moderate of the shoulder on 04/16/2016 which showed mild tendinosis of the supraspinatus tendon with a small insertional interstitial tear.  Patient has noticed worsening of pain and swelling in the left posterior chest wall. Today lab work showed PTT of 43. He denies nausea vomiting or diarrhea. No shortness of breath. Denies any coughing. No fever but has been having episodes of profuse sweating intermittently    Assessment & Plan:   Principal Problem:   Hematoma Active Problems:   Abnormal partial thromboplastin time (PTT)   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HCC)   Diabetes mellitus (HCC)   Chest pain   CKD (chronic kidney disease) stage 3, GFR 30-59 ml/min   Mass of chest wall, left   Acute respiratory failure with hypoxia (HCC)   Hypoxia  #1 left posterior chest wall/upper back hematoma Questionable etiology. Per patient increasing size and hematoma however H&H stable. MRI consistent with hematoma.. Hematology/oncology following. Patient with significant pain and IV pain regimen was changed to reduce Dilaudid PCA pump. Patient still with complaints of significant pain and as such will place on full Dilaudid PCA pump. Monitor closely for respiratory depression.   #2 acute respiratory distress with hypoxia Likely secondary to respiratory depression  from narcotic pain medication. Patient alert speaking in full sentences and not in extremis. Patient with clinical improvement and on nasal cannula. ABG obtained was consistent with respiratory depression from narcotic pain medication. Patient currently back to baseline. Monitor closely.   #3 chest pain Patient with some complaints of chest pain with both typical and atypical features. Currently chest pain-free. EKG with no significant ischemic changes noted. Chest x-ray unremarkable. Cardiac enzymes negative 3.  Continue PPI. GI cocktail when necessary.  Follow.  #4 leukocytosis and thrombocytosis Concern for myeloproliferative neoplasm/disorder. Patient status post bone marrow biopsy pending. Workup underway. Hematology/oncology following and appreciate input and recommendations.  #5 elevated PTT/PT Mixing study pending. Per hematology/ oncology.  #6 chronic kidney disease Stable.  #7 hypertension Continue lisinopril and metoprolol.  #8 diabetes mellitus CBGs have ranged from  97-130. Sliding scale insulin.   DVT prophylaxis: SCDs Code Status: Full Family Communication: Updated patient. No family at bedside. Disposition Plan: Home pending hematology/oncology evaluation and pain management. Per oncology.   Consultants:   Oncology: Dr. Irene Limbo 04/25/2016  Procedures:   CT biopsy done as outpatient 04/25/2016 per Dr. Geroge Baseman  MRI chest 04/26/2016  Antimicrobials:   None   Subjective: Patient states some improvement with pain on reduce PCA pump however still in a lot of pain. No chest pain.   Objective: Vitals:   04/28/16 0224 04/28/16 0416 04/28/16 0510 04/28/16 1400  BP: (!) 161/74  (!) 154/77   Pulse: 63  68   Resp: '12 14 13 '$ (!) 9  Temp: 98.1 F (36.7 C)  98.3 F (36.8 C)   TempSrc: Oral  Oral   SpO2: 96% 96% 95% 96%  Weight:  Height:        Intake/Output Summary (Last 24 hours) at 04/28/16 1459 Last data filed at 04/28/16 0952  Gross per 24 hour    Intake            687.3 ml  Output                0 ml  Net            687.3 ml   Filed Weights   04/25/16 1618  Weight: 89.4 kg (197 lb)    Examination:  General exam: Alert. Speaking in full sentences. Respiratory system: Clear to auscultation anterior lung fields. Respiratory effort normal.Speaking in full sentences. Cardiovascular system: S1 & S2 heard, RRR. No JVD, murmurs, rubs, gallops or clicks. No pedal edema. Gastrointestinal system: Abdomen is nondistended, soft and nontender. No organomegaly or masses felt. Normal bowel sounds heard. Central nervous system: Alert and oriented. No focal neurological deficits. Extremities: Symmetric 5 x 5 power. Skin: Left upper back/scapular region with tender soft tissue swelling. No rashes, lesions or ulcers Psychiatry: Judgement and insight appear normal. Mood & affect appropriate.     Data Reviewed: I have personally reviewed following labs and imaging studies  CBC:  Recent Labs Lab 04/23/16 1154 04/25/16 0915 04/26/16 0423 04/27/16 0413 04/28/16 0345  WBC 17.4* 17.4* 14.2* 13.4* 13.4*  NEUTROABS  --   --   --  10.6* 9.8*  HGB 12.2* 11.8* 11.5* 11.8* 11.3*  HCT 38.1* 36.0* 35.0* 35.3* 34.6*  MCV 90.7 89.8 90.7 88.7 90.3  PLT 648* 675* 589* 715* 169*   Basic Metabolic Panel:  Recent Labs Lab 04/23/16 1154 04/26/16 0423 04/27/16 0413 04/28/16 0345  NA 136 138 138 142  K 4.7 4.3 4.5 4.9  CL 103 107 107 107  CO2 '27 26 25 29  '$ GLUCOSE 111* 117* 197* 105*  BUN 16 17 21* 27*  CREATININE 1.61* 1.42* 1.49* 1.65*  CALCIUM 8.8* 8.8* 8.7* 9.0   GFR: Estimated Creatinine Clearance: 45.7 mL/min (by C-G formula based on SCr of 1.65 mg/dL (H)). Liver Function Tests:  Recent Labs Lab 04/23/16 1154 04/26/16 0423  AST 38 29  ALT 25 19  ALKPHOS 133* 125  BILITOT 0.7 0.7  PROT 6.7 6.2*  ALBUMIN 3.5 3.2*   No results for input(s): LIPASE, AMYLASE in the last 168 hours. No results for input(s): AMMONIA in the last 168  hours. Coagulation Profile:  Recent Labs Lab 04/25/16 0915  INR 1.09   Cardiac Enzymes:  Recent Labs Lab 04/27/16 0908 04/27/16 1515 04/27/16 2054  TROPONINI <0.03 <0.03 <0.03   BNP (last 3 results) No results for input(s): PROBNP in the last 8760 hours. HbA1C:  Recent Labs  04/26/16 0423  HGBA1C 6.0*   CBG:  Recent Labs Lab 04/27/16 1219 04/27/16 1602 04/27/16 2310 04/28/16 0802 04/28/16 1230  GLUCAP 133* 135* 118* 97 130*   Lipid Profile: No results for input(s): CHOL, HDL, LDLCALC, TRIG, CHOLHDL, LDLDIRECT in the last 72 hours. Thyroid Function Tests: No results for input(s): TSH, T4TOTAL, FREET4, T3FREE, THYROIDAB in the last 72 hours. Anemia Panel: No results for input(s): VITAMINB12, FOLATE, FERRITIN, TIBC, IRON, RETICCTPCT in the last 72 hours. Sepsis Labs: No results for input(s): PROCALCITON, LATICACIDVEN in the last 168 hours.  Recent Results (from the past 240 hour(s))  Culture, Urine     Status: None   Collection Time: 04/26/16 11:05 AM  Result Value Ref Range Status   Specimen Description URINE, RANDOM  Final  Special Requests NONE  Final   Culture NO GROWTH Performed at [facility]   Final   Report Status 04/27/2016 FINAL  Final         Radiology Studies: Dg Chest 1 View  Result Date: 04/27/2016 CLINICAL DATA:  Hypoxia.  Hypertension. EXAM: CHEST 1 VIEW COMPARISON:  April 15, 2016 chest radiograph and chest CT April 23, 2016 FINDINGS: There is no edema or consolidation. The heart size and pulmonary vascularity are normal. No adenopathy. There is atherosclerotic calcification aorta. No bone lesions. IMPRESSION: Aortic atherosclerosis.  No edema or consolidation. Electronically Signed   By: Lowella Grip III M.D.   On: 04/27/2016 07:13   Mr Chest Wo Contrast  Result Date: 04/27/2016 CLINICAL DATA:  Evaluate left chest wall mass. EXAM: MRI CHEST WITHOUT CONTRAST TECHNIQUE: Multiplanar, multisequence MR imaging was  performed. No intravenous contrast was administered. COMPARISON:  Chest CT 04/23/2016 FINDINGS: The chest wall mass identified on the chest CT demonstrates increased T1 signal intensity most consistent with a hematoma. The actual hematoma measures a maximum of 8 x 3 cm. This likely arising in the serratus anterior muscle but there is also some hematoma outside the muscle. There is significant surrounding fluid and edema which is involving the latissimus dorsi muscle also. No scapular fracture or rib fracture. The visualized left lung is grossly clear.  No rib lesions. IMPRESSION: Large left-sided chest wall hematoma with surrounding fluid, edema and inflammation. No underlying scapular or left rib fracture. Electronically Signed   By: Marijo Sanes M.D.   On: 04/27/2016 13:03        Scheduled Meds: . DULoxetine  120 mg Oral Daily  . feeding supplement (ENSURE ENLIVE)  237 mL Oral BID BM  . HYDROmorphone   Intravenous Q4H  . insulin aspart  0-9 Units Subcutaneous TID WC  . lisinopril  20 mg Oral Daily  . metoprolol succinate  25 mg Oral Daily  . pantoprazole  40 mg Oral Daily   Continuous Infusions:     LOS: 2 days    Time spent: 35 minutes    Alan Grabe, MD [facility] Pager [phone]  If 7PM-7AM, please contact night-coverage [email or web address] Password [email or web address] 04/28/2016, 2:59 PM

## 2016-04-29 ENCOUNTER — Encounter (HOSPITAL_COMMUNITY): Payer: Self-pay | Admitting: General Surgery

## 2016-04-29 DIAGNOSIS — D471 Chronic myeloproliferative disease: Secondary | ICD-10-CM

## 2016-04-29 LAB — PTT FACTOR INHIBITOR (MIXING STUDY)
APTT 1 1 NORMAL PLASMA: 25.2 s (ref 22.9–30.2)
APTT 11 MIX SALINE: 35.9 s
APTT: 28.4 s (ref 22.9–30.2)

## 2016-04-29 LAB — GLUCOSE, CAPILLARY
GLUCOSE-CAPILLARY: 135 mg/dL — AB (ref 65–99)
Glucose-Capillary: 173 mg/dL — ABNORMAL HIGH (ref 65–99)
Glucose-Capillary: 100 mg/dL — ABNORMAL HIGH (ref 65–99)
Glucose-Capillary: 108 mg/dL — ABNORMAL HIGH (ref 65–99)

## 2016-04-29 LAB — BASIC METABOLIC PANEL
ANION GAP: 5 (ref 5–15)
BUN: 26 mg/dL — ABNORMAL HIGH (ref 6–20)
CALCIUM: 9.2 mg/dL (ref 8.9–10.3)
CHLORIDE: 104 mmol/L (ref 101–111)
CO2: 31 mmol/L (ref 22–32)
Creatinine, Ser: 1.37 mg/dL — ABNORMAL HIGH (ref 0.61–1.24)
GFR calc Af Amer: 59 mL/min — ABNORMAL LOW (ref >60)
GFR calc non Af Amer: 51 mL/min — ABNORMAL LOW (ref >60)
GLUCOSE: 127 mg/dL — AB (ref 65–99)
Potassium: 5.7 mmol/L — ABNORMAL HIGH (ref 3.5–5.1)
Sodium: 140 mmol/L (ref 135–145)

## 2016-04-29 LAB — POTASSIUM: POTASSIUM: 4.6 mmol/L (ref 3.5–5.1)

## 2016-04-29 LAB — CBC
HEMATOCRIT: 35.3 % — AB (ref 39.0–52.0)
HEMOGLOBIN: 11.3 g/dL — AB (ref 13.0–17.0)
MCH: 29.2 pg (ref 26.0–34.0)
MCHC: 32 g/dL (ref 30.0–36.0)
MCV: 91.2 fL (ref 78.0–100.0)
Platelets: 509 10*3/uL — ABNORMAL HIGH (ref 150–400)
RBC: 3.87 MIL/uL — ABNORMAL LOW (ref 4.22–5.81)
RDW: 21.6 % — AB (ref 11.5–15.5)
WBC: 13.9 10*3/uL — AB (ref 4.0–10.5)

## 2016-04-29 LAB — COAG STUDIES INTERP REPORT: PDF Image: 0

## 2016-04-29 LAB — SAVE SMEAR

## 2016-04-29 LAB — VON WILLEBRAND PANEL
Coagulation Factor VIII: 214 % — ABNORMAL HIGH (ref 57–163)
Ristocetin Co-factor, Plasma: 188 % (ref 50–200)
VON WILLEBRAND ANTIGEN, PLASMA: 273 % — AB (ref 50–200)

## 2016-04-29 MED ORDER — HYDROMORPHONE 1 MG/ML IV SOLN
INTRAVENOUS | Status: DC
  Administered 2016-04-29: 20:00:00 via INTRAVENOUS
  Administered 2016-04-29: 4.8 mg via INTRAVENOUS
  Administered 2016-04-29: 10 mg via INTRAVENOUS
  Administered 2016-04-29: 0.8 mg via INTRAVENOUS
  Administered 2016-04-30: 6.4 mg via INTRAVENOUS
  Administered 2016-04-30: 3.6 mg via INTRAVENOUS
  Administered 2016-04-30: 4.3 mg via INTRAVENOUS
  Administered 2016-04-30: 18:00:00 via INTRAVENOUS
  Administered 2016-04-30: 5.59 mg via INTRAVENOUS
  Administered 2016-04-30: 9.6 mg via INTRAVENOUS
  Administered 2016-04-30: 3.2 mg via INTRAVENOUS
  Administered 2016-05-01: 2 mg via INTRAVENOUS
  Administered 2016-05-01: 2.8 mg via INTRAVENOUS
  Administered 2016-05-01 (×2): 3.6 mg via INTRAVENOUS
  Administered 2016-05-01: 2.4 mg via INTRAVENOUS
  Administered 2016-05-01: 2.8 mg via INTRAVENOUS
  Administered 2016-05-02: 4 mg via INTRAVENOUS
  Administered 2016-05-02: 3.2 mg via INTRAVENOUS
  Administered 2016-05-02: 6.4 mg via INTRAVENOUS
  Administered 2016-05-02: 7.2 mg via INTRAVENOUS
  Administered 2016-05-02: 25 mg via INTRAVENOUS
  Administered 2016-05-02: 6 mg via INTRAVENOUS
  Administered 2016-05-03: 3.2 mg via INTRAVENOUS
  Administered 2016-05-03: 2.8 mg via INTRAVENOUS
  Administered 2016-05-03: 3.2 mg via INTRAVENOUS
  Administered 2016-05-03: 5.2 mg via INTRAVENOUS
  Filled 2016-04-29 (×5): qty 25

## 2016-04-29 NOTE — Progress Notes (Signed)
[facility]   HEMATOLOGY/ONCOLOGY INPATIENT PROGRESS NOTE  Date of Service: 04/28/2016 Inpatient Attending: .Eugenie Filler, MD   SUBJECTIVE  [name] goes that his left chest wall pain related to the hematoma is better controlled with the Dilaudid PCA. He previously had excessive sedation with bolus higher doses of Dilaudid. No other acute new symptoms. No other evidence of bleeding. PTT mixing study still pending. Bone marrow biopsy results will likely be available tomorrow.    OBJECTIVE:  Mild distress from pain  PHYSICAL EXAMINATION: . Vitals:   04/29/16 0137 04/29/16 0400 04/29/16 0650 04/29/16 1118  BP: (!) 174/73  (!) 167/80   Pulse: 73  75   Resp: '14 11 12 13  '$ Temp: 98.1 F (36.7 C)  98.1 F (36.7 C)   TempSrc: Oral  Oral   SpO2: 98% 96% 95% 97%  Weight:      Height:       Filed Weights   04/25/16 1618  Weight: 197 lb (89.4 kg)   .Body mass index is 26.72 kg/m.  GENERAL:alert, in no acute distress and comfortable SKIN: skin color, texture, turgor are normal, no rashes or significant lesions EYES: normal, conjunctiva are pink and non-injected, sclera clear OROPHARYNX:no exudate, no erythema and lips, buccal mucosa, and tongue normal  NECK: supple, no JVD, thyroid normal size, non-tender, without nodularity LYMPH:  no palpable lymphadenopathy in the cervical, axillary or inguinal LUNGS: clear to auscultation with normal respiratory effort, left posterior chest wall hematoma appears to be getting somewhat softer. HEART: regular rate & rhythm,  no murmurs and no lower extremity edema ABDOMEN: No guarding rigidity or rebound. Large splenomegaly. Musculoskeletal: no cyanosis of digits and no clubbing  PSYCH: alert & oriented x 3 with fluent speech NEURO: no focal motor/sensory deficits  MEDICAL HISTORY:  Past Medical History:  Diagnosis Date  . Anxiety   . Chronic back pain   . Diabetes mellitus   . Gastritis   . GERD (gastroesophageal reflux disease)   .  Hypertension   . Renal insufficiency     SURGICAL HISTORY: Past Surgical History:  Procedure Laterality Date  . BACK SURGERY    . COLONOSCOPY WITH PROPOFOL N/A 02/01/2013   SLF: 1. 23 colon polyps removed. 2 retrieved. 2. Mild diverticulosis in teh sigmoid colon 3. Small internal hemorrhoids 4. The colon is redundant   . ESOPHAGOGASTRODUODENOSCOPY (EGD) WITH PROPOFOL N/A 02/01/2013   SLF: 1. Moderate erosive gastritis  . POLYPECTOMY N/A 02/01/2013   Procedure: POLYPECTOMY;  Surgeon: Danie Binder, MD;  Location: [facility];  Service: Endoscopy;  Laterality: N/A;    SOCIAL HISTORY: Social History   Social History  . Marital status: Divorced    Spouse name: N/A  . Number of children: N/A  . Years of education: N/A   Occupational History  . retired     Architect   Social History Main Topics  . Smoking status: Former Smoker    Packs/day: 1.00    Years: 20.00    Types: Cigars    Quit date: 08/04/2013  . Smokeless tobacco: Never Used  . 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 Drug use: No  . Sexual activity: Yes    Birth control/ protection: None   Other Topics Concern  . Not on file   Social History Narrative  . No narrative on file    FAMILY HISTORY: Family History  Problem Relation Age of Onset  .  Colon cancer Neg Hx     ALLERGIES:  is allergic to hydrocodone.  MEDICATIONS:  Scheduled Meds: . DULoxetine  120 mg Oral Daily  . feeding supplement (ENSURE ENLIVE)  237 mL Oral BID BM  . HYDROmorphone   Intravenous Q4H  . insulin aspart  0-9 Units Subcutaneous TID WC  . metoprolol succinate  25 mg Oral Daily  . pantoprazole  40 mg Oral Daily   Continuous Infusions:  PRN Meds:.acetaminophen, cyclobenzaprine, diphenhydrAMINE **OR** diphenhydrAMINE, gi cocktail, hydrALAZINE, naloxone **AND** sodium chloride flush, ondansetron **OR** ondansetron (ZOFRAN) IV, ondansetron (ZOFRAN) IV  REVIEW OF SYSTEMS:    10 Point  review of Systems was done is negative except as noted above.   LABORATORY DATA:  I have reviewed the data as listed  . CBC Latest Ref Rng & Units 04/29/2016 04/28/2016 04/27/2016  WBC 4.0 - 10.5 K/uL 13.9(H) 13.4(H) 13.4(H)  Hemoglobin 13.0 - 17.0 g/dL 11.3(L) 11.3(L) 11.8(L)  Hematocrit 39.0 - 52.0 % 35.3(L) 34.6(L) 35.3(L)  Platelets 150 - 400 K/uL 509(H) 546(H) 715(H)    . CMP Latest Ref Rng & Units 04/29/2016 04/29/2016 04/28/2016  Glucose 65 - 99 mg/dL - 127(H) 105(H)  BUN 6 - 20 mg/dL - 26(H) 27(H)  Creatinine 0.61 - 1.24 mg/dL - 1.37(H) 1.65(H)  Sodium 135 - 145 mmol/L - 140 142  Potassium 3.5 - 5.1 mmol/L 4.6 5.7(H) 4.9  Chloride 101 - 111 mmol/L - 104 107  CO2 22 - 32 mmol/L - 31 29  Calcium 8.9 - 10.3 mg/dL - 9.2 9.0  Total Protein 6.5 - 8.1 g/dL - - -  Total Bilirubin 0.3 - 1.2 mg/dL - - -  Alkaline Phos 38 - 126 U/L - - -  AST 15 - 41 U/L - - -  ALT 17 - 63 U/L - - -     RADIOGRAPHIC STUDIES: I have personally reviewed the radiological images as listed and agreed with the findings in the report. Ct Abdomen Pelvis Wo Contrast  Result Date: 04/23/2016 CLINICAL DATA:  LEFT posterior chest wall mass. Pain and swelling for 1 week EXAM: CT CHEST, ABDOMEN AND PELVIS WITHOUT CONTRAST TECHNIQUE: Multidetector CT imaging of the chest, abdomen and pelvis was performed following the standard protocol without IV contrast. COMPARISON:  CT 02/06/2011, 12/15/2014 FINDINGS: CT CHEST FINDINGS Cardiovascular: Coronary artery calcification and aortic atherosclerotic calcification. Mediastinum/Nodes: No axillary supraclavicular adenopathy. No mediastinal hilar adenopathy. No pericardial fluid. Esophagus Lungs/Pleura: No scattered subpleural pulmonary nodularity. These are numerous and appear benign but less than 5 mm. Musculoskeletal: Beneath the tip of the LEFT shoulder blade, there is thickening of the adjacent musculature to 4.0 x 5.0 cm (image 42, series 2). This soft tissue thickening  slightly more dense than adjacent muscle stool tissue density. Lesion measures approximately 6 cm in craniocaudad dimension (image 130, series 5). No IV contrast administered. CT ABDOMEN PELVIS FINDINGS Hepatobiliary: No focal hepatic lesions noncontrast exam. Normal gallbladder. Pancreas: Normal pancreas Spleen: Spleen is enlarged measuring 21 cm in craniocaudad dimension which compares to 20 cm on CT of 12/15/2014. Adrenals/Urinary Tract: Adrenal glands are normal. LEFT kidney most inferior by the enlarged spleen. No hydronephrosis. Bladder normal. Stomach/Bowel: Stomach, small bowel appendix cecum normal. Moderate volume stool in colon. Rectum normal Vascular/Lymphatic: Abdominal aorta is normal caliber with atherosclerotic calcification. There is no retroperitoneal or periportal lymphadenopathy. No pelvic lymphadenopathy. Reproductive: Prostate normal. Other: Large calcified injection granuloma in the LEFT buttock region measures 4 cm x 2.6 cm. Musculoskeletal: Mottled sclerotic pattern to the bones not  changed from prior. IMPRESSION: Chest Impression: 1. Oblong soft tissue mass within the musculature inferior to the LEFT scapula likely represents a intramuscular hematoma. Neoplasm would be less likely but not completely excluded. 2. Coronary artery calcification and aortic atherosclerotic calcification. Abdomen / Pelvis Impression: 1. Marked splenomegaly. 2. Mottled sclerotic pattern within the bones unchanged. These results will be called to the ordering clinician or representative by the Radiologist Assistant, and communication documented in the PACS or zVision Dashboard. Electronically Signed   By: Suzy Bouchard M.D.   On: 04/23/2016 15:09   Dg Chest 1 View  Result Date: 04/27/2016 CLINICAL DATA:  Hypoxia.  Hypertension. EXAM: CHEST 1 VIEW COMPARISON:  April 15, 2016 chest radiograph and chest CT April 23, 2016 FINDINGS: There is no edema or consolidation. The heart size and pulmonary vascularity  are normal. No adenopathy. There is atherosclerotic calcification aorta. No bone lesions. IMPRESSION: Aortic atherosclerosis.  No edema or consolidation. Electronically Signed   By: Lowella Grip III M.D.   On: 04/27/2016 07:13   Dg Chest 2 View  Result Date: 04/15/2016 CLINICAL DATA:  Mid and left-sided chest pain began at 3 a.m. today. Unable to raise the left arm due to pain. EXAM: CHEST  2 VIEW COMPARISON:  Portable chest x-ray dated March 31, 2016. FINDINGS: The lungs are mildly hypoinflated but clear. There is no pneumothorax or pleural effusion. The heart and pulmonary vascularity are normal. The mediastinum is normal in width. There is calcification in the wall of the thoracic aorta. The bony thorax exhibits no acute abnormality. IMPRESSION: Mild hypoinflation.  No acute cardiopulmonary abnormality. Aortic atherosclerosis. Electronically Signed   By: David  Martinique M.D.   On: 04/15/2016 10:09   Ct Chest Wo Contrast  Result Date: 04/23/2016 CLINICAL DATA:  Left posterior chest wall mass and swelling for 1 week, dyspnea EXAM: CT CHEST WITHOUT CONTRAST TECHNIQUE: Multidetector CT imaging of the chest was performed following the standard protocol without IV contrast. COMPARISON:  04/15/2016 MRI of the left shoulder unfortunately excluded the area of interest. FINDINGS: Cardiovascular: Top normal size cardiac chambers with coronary arteriosclerosis. No pericardial effusion. Atherosclerosis of the aortic arch and descending aorta without aneurysm. Mediastinum/Nodes: No enlarged mediastinal or axillary lymph nodes. Thyroid gland, trachea, and esophagus demonstrate no significant findings. There is slight dilatation of the distally esophagus possibly from aerophagia. Small hiatal hernia Lungs/Pleura: 2 mm nodular density adjacent to the right major fissure within the anterior right lower lobe, series 3, image 87/88. Pleural-based areas of ill-defined opacity suggestive of atelectasis and/or scarring  in the left lower lobe, the largest approximately 5 mm, series 3, image 80. Mild lower lobe bronchiectasis. Tiny tree-in-bud densities along the periphery of the left lower lobe. No pneumothorax. Upper Abdomen: Splenomegaly. The spleen measures 15.3 cm AP by RO Normal adrenal glands. The unenhanced liver is unremarkable as is the pancreas for focal mass. No ductal dilatation. The gallbladder is free of stones. Normal bowel rotation. Musculoskeletal: Deep to the left trapezius muscle is a heterogeneous crescentic soft tissue abnormality suspicious for a hematoma given Hounsfield unit calculations between 50 and 60 and given the patient's clinical history of pain and swelling over the past week. This measures up to 3 cm in thickness by 10 cm in width by 8.3 cm craniocaudad. The etiology is uncertain. Patchy bony demineralization of the dorsal spine with degenerative disc disease at T11-12 and L5-S1. No acute fracture is apparent. IMPRESSION: Heterogeneous crescentic soft tissue abnormality deep to the left trapezius muscle with Hounsfield  units suggestive of hematoma with internal heterogeneity suggesting areas of probable clot. This measures approximately 10 x 3 x 8.3 cm. Follow up may prove useful to ensure stability and/or resolution as this may mask other underlying pathology. Patchy bony demineralization of the axial skeleton. Splenomegaly. Electronically Signed   By: Ashley Royalty M.D.   On: 04/23/2016 15:01   Alan Chest Wo Contrast  Result Date: 04/27/2016 CLINICAL DATA:  Evaluate left chest wall mass. EXAM: MRI CHEST WITHOUT CONTRAST TECHNIQUE: Multiplanar, multisequence Alan imaging was performed. No intravenous contrast was administered. COMPARISON:  Chest CT 04/23/2016 FINDINGS: The chest wall mass identified on the chest CT demonstrates increased T1 signal intensity most consistent with a hematoma. The actual hematoma measures a maximum of 8 x 3 cm. This likely arising in the serratus anterior muscle but  there is also some hematoma outside the muscle. There is significant surrounding fluid and edema which is involving the latissimus dorsi muscle also. No scapular fracture or rib fracture. The visualized left lung is grossly clear.  No rib lesions. IMPRESSION: Large left-sided chest wall hematoma with surrounding fluid, edema and inflammation. No underlying scapular or left rib fracture. Electronically Signed   By: Marijo Sanes M.D.   On: 04/27/2016 13:03   Alan Shoulder Left Wo Contrast  Result Date: 04/16/2016 CLINICAL DATA:  Left shoulder pain without a injury. EXAM: MRI OF THE LEFT SHOULDER WITHOUT CONTRAST TECHNIQUE: Multiplanar, multisequence Alan imaging of the shoulder was performed. No intravenous contrast was administered. COMPARISON:  None. FINDINGS: Rotator cuff: Mild tendinosis of the supraspinatus tendon with a small insertional interstitial tear. Infraspinatus tendon is intact. Teres minor tendon is intact. Subscapularis tendon is intact. Muscles: No atrophy or fatty replacement of nor abnormal signal within, the muscles of the rotator cuff. Biceps long head:  Intact. Acromioclavicular Joint: Mild arthropathy of the acromioclavicular joint. Type I acromion. Trace amount of subacromial/ subdeltoid bursal fluid. Glenohumeral Joint: No joint effusion.  Mild chondromalacia. Labrum: Limited evaluation secondary lack of intra-articular contrast. Small posterior labral tear at the labroligamentous junction. Bones: Mild subcortical reactive marrow changes in the lesser tuberosity. No other marrow signal abnormality. No fracture or dislocation. Other: No fluid collection or hematoma. IMPRESSION: 1. Mild tendinosis of the supraspinatus tendon with a small insertional interstitial tear. 2. Mild subacromial/subdeltoid bursitis. Electronically Signed   By: Kathreen Devoid   On: 04/16/2016 08:08   Ct Biopsy  Result Date: 04/25/2016 INDICATION: 70 year old male with splenomegaly and thrombocytosis and clinical  concern for myeloproliferative neoplasm. EXAM: CT GUIDED BONE MARROW ASPIRATION AND CORE BIOPSY Interventional Radiologist:  Criselda Peaches, MD MEDICATIONS: None. ANESTHESIA/SEDATION: Moderate (conscious) sedation was employed during this procedure. A total of 4 milligrams versed and 200 micrograms fentanyl were administered intravenously. The patient's level of consciousness and vital signs were monitored continuously by radiology nursing throughout the procedure under my direct supervision. Total monitored sedation time: 17 minutes FLUOROSCOPY TIME:  Fluoroscopy Time: 0 minutes 0 seconds (0 mGy). COMPLICATIONS: None immediate. Estimated blood loss: <25 mL PROCEDURE: Informed written consent was obtained from the patient after a thorough discussion of the procedural risks, benefits and alternatives. All questions were addressed. Maximal Sterile Barrier Technique was utilized including caps, mask, sterile gowns, sterile gloves, sterile drape, hand hygiene and skin antiseptic. A timeout was performed prior to the initiation of the procedure. The patient was positioned prone and non-contrast localization CT was performed of the pelvis to demonstrate the iliac marrow spaces. Maximal barrier sterile technique utilized including caps, mask, sterile  gowns, sterile gloves, large sterile drape, hand hygiene, and betadine prep. Under sterile conditions and local anesthesia, an 11 gauge coaxial bone biopsy needle was advanced into the right iliac marrow space. Needle position was confirmed with CT imaging. Initially, bone marrow aspiration was performed. Next, the 11 gauge outer cannula was utilized to obtain a right iliac bone marrow core biopsy. Needle was removed. Hemostasis was obtained with compression. The patient tolerated the procedure well. Samples were prepared with the cytotechnologist. IMPRESSION: Technically successful CT-guided core biopsy of the right iliac bone. Electronically Signed   By: Jacqulynn Cadet M.D.   On: 04/25/2016 12:47   Dg Shoulder Left  Result Date: 04/15/2016 CLINICAL DATA:  Onset of left shoulder pain this morning without known injury EXAM: LEFT SHOULDER - 2+ VIEW COMPARISON:  None in PACs FINDINGS: The bones are subjectively adequately mineralized. The AP view is suboptimally positioned. No acute fracture is observed. The glenohumeral joint space is reasonably well-maintained. The subacromial subdeltoid space is normal. A small spur arises from the inferior tip of the left clavicle. IMPRESSION: No acute bony abnormality of the left shoulder. Small spur arises from the inferior articular margin of the right clavicle and may be impacting the superior aspect of the rotator cuff. Electronically Signed   By: David  Martinique M.D.   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 Aspirate - predominantly peripheral blood, touch smear -poor quality (discussed with Dr Monica Martinez), core bx results likely on Monday -if significantly increasing PLT counts -might need to consider hydroxyurea  2) Abnormal PTT (patient not on blood thinners) ?factor inhibitor  -rpt PTT still elevated -PTT Mixing study -in process -fviii and fix level wnl --makes fviii inhibitor -vwd panel wnl -fibrinogen- appropriately elevated.  3) Left chest wall hematoma -hgb stable Plan -monitor hgb -pain mx per hospitalist -MRI chest -consistent with hematoma no evidence of underlying tumor. -evaluation and rx of coagulopathy -monitor and optimize htn control -no blood thinners/NSAIDS  4) chest pain - recent positive stress test  5) CKD  I spent 25 minutes counseling the patient face to face. The total time spent in the appointment  was 25 minutes and more than 50% was on counseling and direct patient cares.    Sullivan Lone MD [place] [facility] Hematology/Oncology Physician [facility]  (Office):       [phone] (Work cell):  [phone] (Fax):           [phone]

## 2016-04-29 NOTE — Progress Notes (Signed)
Patient ID: [name], male   DOB: 1946/04/06, 70 y.o.   MRN: [id number]    Referring Physician(s): Wyvonnia Lora  Supervising Physician: Simonne Come  Patient Status: inpt  Chief Complaint: leukocytosis  Subjective: The patient is known to [place] as he was just here on 10/6 for a bone marrow biopsy.  This went well but he was admitted afterwards for pain control secondary to a chest hematoma.  His results returned today that revealed insufficient bone marrow matter.  A repeat request for a BM BX has been placed while he is still here.  Allergies: Hydrocodone  Medications: Prior to Admission medications   Medication Sig Start Date End Date Taking? Authorizing Provider  acetaminophen (TYLENOL) 500 MG tablet Take 500 mg by mouth every 6 (six) hours as needed for mild pain or moderate pain.   Yes Historical Provider, MD  cyclobenzaprine (FLEXERIL) 5 MG tablet Take 1 tablet (5 mg total) by mouth 3 (three) times daily as needed for muscle spasms. 08/08/15  Yes Burgess Amor, PA-C  DULoxetine (CYMBALTA) 60 MG capsule Take 2 capsules by mouth daily.  09/19/13  Yes Historical Provider, MD  lisinopril (PRINIVIL,ZESTRIL) 20 MG tablet Take 20 mg by mouth daily.   Yes Historical Provider, MD  metFORMIN (GLUCOPHAGE) 500 MG tablet Take 500 mg by mouth daily.   Yes Historical Provider, MD  metoprolol succinate (TOPROL-XL) 25 MG 24 hr tablet Take 25 mg by mouth daily.   Yes Historical Provider, MD  omeprazole (PRILOSEC) 20 MG capsule Take 20 mg by mouth 2 (two) times daily.  01/14/13  Yes Historical Provider, MD  oxyCODONE-acetaminophen (PERCOCET/ROXICET) 5-325 MG tablet Take 1-2 tablets by mouth every 4 (four) hours as needed for severe pain. 04/23/16  Yes Johney Maine, MD  traMADol (ULTRAM) 50 MG tablet Take 1 tablet (50 mg total) by mouth every 12 (twelve) hours as needed for moderate pain. Patient not taking: Reported on 04/25/2016 04/16/16   Standley Brooking, MD    Vital Signs: BP (!) 155/72 (BP  Location: Left Arm)   Pulse 72   Temp 98.2 F (36.8 C) (Oral)   Resp 13   Ht 6' (1.829 m)   Wt 197 lb (89.4 kg)   SpO2 97%   BMI 26.72 kg/m   Physical Exam: Gen: NAD Heart: regular rate and rhythm Lungs: CTAB Abd: soft, NT, ND, +BS  Imaging: Dg Chest 1 View  Result Date: 04/27/2016 CLINICAL DATA:  Hypoxia.  Hypertension. EXAM: CHEST 1 VIEW COMPARISON:  April 15, 2016 chest radiograph and chest CT April 23, 2016 FINDINGS: There is no edema or consolidation. The heart size and pulmonary vascularity are normal. No adenopathy. There is atherosclerotic calcification aorta. No bone lesions. IMPRESSION: Aortic atherosclerosis.  No edema or consolidation. Electronically Signed   By: Bretta Bang III M.D.   On: 04/27/2016 07:13   Mr Chest Wo Contrast  Result Date: 04/27/2016 CLINICAL DATA:  Evaluate left chest wall mass. EXAM: MRI CHEST WITHOUT CONTRAST TECHNIQUE: Multiplanar, multisequence MR imaging was performed. No intravenous contrast was administered. COMPARISON:  Chest CT 04/23/2016 FINDINGS: The chest wall mass identified on the chest CT demonstrates increased T1 signal intensity most consistent with a hematoma. The actual hematoma measures a maximum of 8 x 3 cm. This likely arising in the serratus anterior muscle but there is also some hematoma outside the muscle. There is significant surrounding fluid and edema which is involving the latissimus dorsi muscle also. No scapular fracture or rib fracture. The visualized  left lung is grossly clear.  No rib lesions. IMPRESSION: Large left-sided chest wall hematoma with surrounding fluid, edema and inflammation. No underlying scapular or left rib fracture. Electronically Signed   By: Marijo Sanes M.D.   On: 04/27/2016 13:03    Labs:  CBC:  Recent Labs  04/26/16 0423 04/27/16 0413 04/28/16 0345 04/29/16 0341  WBC 14.2* 13.4* 13.4* 13.9*  HGB 11.5* 11.8* 11.3* 11.3*  HCT 35.0* 35.3* 34.6* 35.3*  PLT 589* 715* 546* 509*     COAGS:  Recent Labs  04/25/16 0915 04/26/16 0423  INR 1.09  --   APTT 43* 44*  28.4    BMP:  Recent Labs  04/26/16 0423 04/27/16 0413 04/28/16 0345 04/29/16 0341 04/29/16 0832  NA 138 138 142 140  --   K 4.3 4.5 4.9 5.7* 4.6  CL 107 107 107 104  --   CO2 '26 25 29 31  '$ --   GLUCOSE 117* 197* 105* 127*  --   BUN 17 21* 27* 26*  --   CALCIUM 8.8* 8.7* 9.0 9.2  --   CREATININE 1.42* 1.49* 1.65* 1.37*  --   GFRNONAA 49* 46* 40* 51*  --   GFRAA 56* 53* 47* 59*  --     LIVER FUNCTION TESTS:  Recent Labs  04/09/16 1125 04/15/16 0919 04/23/16 1154 04/26/16 0423  BILITOT 0.6 0.6 0.7 0.7  AST 34 41 38 29  ALT '20 27 25 19  '$ ALKPHOS 114 128* 133* 125  PROT 6.7 7.2 6.7 6.2*  ALBUMIN 3.6 3.9 3.5 3.2*    Assessment and Plan: 1. Leukocytosis, thrombocytopenia, splenomegaly -we will proceed with repeat BMBX to help get a diagnosis.  However, due to scheduling we will not be able to proceed until Thursday morning.  I did discuss this with him already. -NPO p MN on Thursday morning for this procedure -labs and vitals have been reviewed. -Risks and Benefits discussed with the patient including, but not limited to bleeding, infection, damage to adjacent structures or low yield requiring additional tests. All of the patient's questions were answered, patient is agreeable to proceed. Consent signed and in chart.   Electronically Signed: Henreitta Cea 04/29/2016, 3:49 PM   I spent a total of 25 Minutes at the the patient's bedside AND on the patient's hospital floor or unit, greater than 50% of which was counseling/coordinating care for leukocytosis.

## 2016-04-29 NOTE — Progress Notes (Signed)
PROGRESS NOTE    [name]  OMV:[id number] DOB: 03-27-1946 DOA: 04/25/2016 PCP: [name], MD    Brief Narrative:  [name]  is a 70 y.o. male, With history of hypertension, diabetes mellitus who came to the short stay for bone marrow biopsy, and was found to have worsening of left-sided chest pain. Patient had a CT scan done on 04/23/2016 which showed heterogenous crescentic soft tissue abnormality deep to the left trapezius muscle suggestive of hematoma. 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 He also had moderate of the shoulder on 04/16/2016 which showed mild tendinosis of the supraspinatus tendon with a small insertional interstitial tear.  Patient has noticed worsening of pain and swelling in the left posterior chest wall. Today lab work showed PTT of 43. He denies nausea vomiting or diarrhea. No shortness of breath. Denies any coughing. No fever but has been having episodes of profuse sweating intermittently  Patient was admitted for pain management and currently on Dilaudid PCA pump. Patient to have repeat bone marrow biopsy done. Hematology/ oncology following.    Assessment & Plan:   Principal Problem:   Hematoma Active Problems:   Abnormal partial thromboplastin time (PTT)   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HCC)   Diabetes mellitus (HCC)   Chest pain   CKD (chronic kidney disease) stage 3, GFR 30-59 ml/min   Mass of chest wall, left   Acute respiratory failure with hypoxia (HCC)   Hypoxia  #1 left posterior chest wall/upper back hematoma Questionable etiology. Per patient increasing size and hematoma however H&H stable. MRI consistent with hematoma.. Hematology/oncology following. Patient with significant pain and IV pain regimen was changed to reduce Dilaudid PCA pump. Patient still with complaints of significant pain and as such will adjust  doses on full dose Dilaudid PCA pump. Monitor closely for respiratory depression.   #2 acute respiratory distress with hypoxia Likely secondary to respiratory depression from narcotic pain medication. Patient alert speaking in full sentences and not in extremis. Patient with clinical improvement and on nasal cannula. Resolved. ABG obtained was consistent with respiratory depression from narcotic pain medication. Patient currently back to baseline. Monitor closely.   #3 chest pain Patient with some complaints of chest pain with both typical and atypical features. Currently chest pain-free. EKG with no significant ischemic changes noted. Chest x-ray unremarkable. Cardiac enzymes negative 3.  Continue PPI. GI cocktail when necessary.  Follow.  #4 leukocytosis and thrombocytosis Concern for myeloproliferative neoplasm/disorder. Patient status post bone marrow biopsy with aspirate predominantly with peripheral blood and poor quality per hematology oncology. Patient will likely need repeat core biopsy.  Hematology/oncology following and appreciate input and recommendations.  #5 elevated PTT/PT Mixing study pending. Per hematology/ oncology.  #6 chronic kidney disease Stable.  #7 hypertension Continue lisinopril and metoprolol.  #8 diabetes mellitus CBGs have ranged from  100-173. Sliding scale insulin.   DVT prophylaxis: SCDs Code Status: Full Family Communication: Updated patient. No family at bedside. Disposition Plan: Home pending hematology/oncology evaluation and pain management. Per oncology.   Consultants:   Oncology: Dr. Irene Limbo 04/25/2016  Procedures:   CT biopsy done as outpatient 04/25/2016 per Dr. Geroge Baseman  MRI chest 04/26/2016  Antimicrobials:   None   Subjective: Patient states some improvement with pain on full PCA pump however still in a lot of pain. No chest pain.   Objective: Vitals:   04/29/16 0400 04/29/16 0650 04/29/16 1118 04/29/16 1335  BP:  Marland Kitchen)  167/80  (!) 155/72  Pulse:  75  72  Resp: '11 12 13 13  '$ Temp:  98.1 F (36.7 C)  98.2 F (36.8 C)  TempSrc:  Oral  Oral  SpO2: 96% 95% 97% 97%  Weight:      Height:        Intake/Output Summary (Last 24 hours) at 04/29/16 1542 Last data filed at 04/29/16 0651  Gross per 24 hour  Intake              250 ml  Output              500 ml  Net             -250 ml   Filed Weights   04/25/16 1618  Weight: 89.4 kg (197 lb)    Examination:  General exam: Alert. Speaking in full sentences. Respiratory system: Clear to auscultation anterior lung fields. Respiratory effort normal. Speaking in full sentences. Cardiovascular system: S1 & S2 heard, RRR. No JVD, murmurs, rubs, gallops or clicks. No pedal edema. Gastrointestinal system: Abdomen is nondistended, soft and nontender. No organomegaly or masses felt. Normal bowel sounds heard. Central nervous system: Alert and oriented. No focal neurological deficits. Extremities: Symmetric 5 x 5 power. Skin: Left upper back/scapular region with tender soft tissue swelling. No rashes, lesions or ulcers Psychiatry: Judgement and insight appear normal. Mood & affect appropriate.     Data Reviewed: I have personally reviewed following labs and imaging studies  CBC:  Recent Labs Lab 04/25/16 0915 04/26/16 0423 04/27/16 0413 04/28/16 0345 04/29/16 0341  WBC 17.4* 14.2* 13.4* 13.4* 13.9*  NEUTROABS  --   --  10.6* 9.8*  --   HGB 11.8* 11.5* 11.8* 11.3* 11.3*  HCT 36.0* 35.0* 35.3* 34.6* 35.3*  MCV 89.8 90.7 88.7 90.3 91.2  PLT 675* 589* 715* 546* 503*   Basic Metabolic Panel:  Recent Labs Lab 04/23/16 1154 04/26/16 0423 04/27/16 0413 04/28/16 0345 04/29/16 0341 04/29/16 0832  NA 136 138 138 142 140  --   K 4.7 4.3 4.5 4.9 5.7* 4.6  CL 103 107 107 107 104  --   CO2 '27 26 25 29 31  '$ --   GLUCOSE 111* 117* 197* 105* 127*  --   BUN 16 17 21* 27* 26*  --   CREATININE 1.61* 1.42* 1.49* 1.65* 1.37*  --   CALCIUM 8.8* 8.8* 8.7* 9.0  9.2  --    GFR: Estimated Creatinine Clearance: 55.1 mL/min (by C-G formula based on SCr of 1.37 mg/dL (H)). Liver Function Tests:  Recent Labs Lab 04/23/16 1154 04/26/16 0423  AST 38 29  ALT 25 19  ALKPHOS 133* 125  BILITOT 0.7 0.7  PROT 6.7 6.2*  ALBUMIN 3.5 3.2*   No results for input(s): LIPASE, AMYLASE in the last 168 hours. No results for input(s): AMMONIA in the last 168 hours. Coagulation Profile:  Recent Labs Lab 04/25/16 0915  INR 1.09   Cardiac Enzymes:  Recent Labs Lab 04/27/16 0908 04/27/16 1515 04/27/16 2054  TROPONINI <0.03 <0.03 <0.03   BNP (last 3 results) No results for input(s): PROBNP in the last 8760 hours. HbA1C: No results for input(s): HGBA1C in the last 72 hours. CBG:  Recent Labs Lab 04/28/16 1230 04/28/16 1700 04/28/16 2203 04/29/16 0743 04/29/16 1132  GLUCAP 130* 139* 115* 173* 135*   Lipid Profile: No results for input(s): CHOL, HDL, LDLCALC, TRIG, CHOLHDL, LDLDIRECT in the last 72 hours. Thyroid Function Tests: No results for input(s):  TSH, T4TOTAL, FREET4, T3FREE, THYROIDAB in the last 72 hours. Anemia Panel: No results for input(s): VITAMINB12, FOLATE, FERRITIN, TIBC, IRON, RETICCTPCT in the last 72 hours. Sepsis Labs: No results for input(s): PROCALCITON, LATICACIDVEN in the last 168 hours.  Recent Results (from the past 240 hour(s))  Culture, Urine     Status: None   Collection Time: 04/26/16 11:05 AM  Result Value Ref Range Status   Specimen Description URINE, RANDOM  Final   Special Requests NONE  Final   Culture NO GROWTH Performed at [facility]   Final   Report Status 04/27/2016 FINAL  Final         Radiology Studies: No results found.      Scheduled Meds: . DULoxetine  120 mg Oral Daily  . feeding supplement (ENSURE ENLIVE)  237 mL Oral BID BM  . HYDROmorphone   Intravenous Q4H  . insulin aspart  0-9 Units Subcutaneous TID WC  . metoprolol succinate  25 mg Oral Daily  . pantoprazole   40 mg Oral Daily   Continuous Infusions:     LOS: 3 days    Time spent: 35 minutes    THOMPSON,DANIEL, MD [facility] Pager [phone]  If 7PM-7AM, please contact night-coverage [email or web address] Password [email or web address] 04/29/2016, 3:42 PM

## 2016-04-29 NOTE — Progress Notes (Signed)
Marland Kitchen   HEMATOLOGY/ONCOLOGY INPATIENT PROGRESS NOTE  Date of Service: 04/29/2016 Inpatient Attending: .Eugenie Filler, MD   SUBJECTIVE  [name] notes that the left chest wall hematoma is somewhat softer. No other bleeding. Unfortunately BM Bx was insufficient for definitive diagnosis and rpt CT guided BM Bx was ordered with patient's consent. Pain on left chest wall better controlled with dilaudid PCA. No other acute new symptoms.    OBJECTIVE:  NAD  PHYSICAL EXAMINATION: . Vitals:   04/29/16 1640 04/29/16 2004 04/29/16 2138 04/29/16 2306  BP:   (!) 175/95   Pulse:   72   Resp: '10 16 20 18  '$ Temp:   98.7 F (37.1 C)   TempSrc:   Oral   SpO2: 100% 96% 98% 94%  Weight:      Height:       Filed Weights   04/25/16 1618  Weight: 197 lb (89.4 kg)   .Body mass index is 26.72 kg/m.  GENERAL:alert, in no acute distress and comfortable SKIN: skin color, texture, turgor are normal, no rashes or significant lesions EYES: normal, conjunctiva are pink and non-injected, sclera clear OROPHARYNX:no exudate, no erythema and lips, buccal mucosa, and tongue normal  NECK: supple, no JVD, thyroid normal size, non-tender, without nodularity LYMPH:  no palpable lymphadenopathy in the cervical, axillary or inguinal LUNGS: clear to auscultation with normal respiratory effort, left posterior chest wall hematoma appears to be getting somewhat softer. HEART: regular rate & rhythm,  no murmurs and no lower extremity edema ABDOMEN: No guarding rigidity or rebound. Large splenomegaly. Musculoskeletal: no cyanosis of digits and no clubbing  PSYCH: alert & oriented x 3 with fluent speech NEURO: no focal motor/sensory deficits  MEDICAL HISTORY:  Past Medical History:  Diagnosis Date  . Anxiety   . Chronic back pain   . Diabetes mellitus   . Gastritis   . GERD (gastroesophageal reflux disease)   . Hypertension   . Renal insufficiency     SURGICAL HISTORY: Past Surgical History:    Procedure Laterality Date  . BACK SURGERY    . COLONOSCOPY WITH PROPOFOL N/A 02/01/2013   SLF: 1. 23 colon polyps removed. 2 retrieved. 2. Mild diverticulosis in teh sigmoid colon 3. Small internal hemorrhoids 4. The colon is redundant   . ESOPHAGOGASTRODUODENOSCOPY (EGD) WITH PROPOFOL N/A 02/01/2013   SLF: 1. Moderate erosive gastritis  . POLYPECTOMY N/A 02/01/2013   Procedure: POLYPECTOMY;  Surgeon: Danie Binder, MD;  Location: [facility];  Service: Endoscopy;  Laterality: N/A;    SOCIAL HISTORY: Social History   Social History  . Marital status: Divorced    Spouse name: N/A  . Number of children: N/A  . Years of education: N/A   Occupational History  . retired     Architect   Social History Main Topics  . Smoking status: Former Smoker    Packs/day: 1.00    Years: 20.00    Types: Cigars    Quit date: 08/04/2013  . Smokeless tobacco: Never Used  . 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 Drug use: No  . Sexual activity: Yes    Birth control/ protection: None   Other Topics Concern  . Not on file   Social History Narrative  . No narrative on file    FAMILY HISTORY: Family History  Problem Relation Age of Onset  . Colon cancer Neg Hx     ALLERGIES:  is allergic to hydrocodone.  MEDICATIONS:  Scheduled Meds: . DULoxetine  120 mg Oral Daily  . feeding supplement (ENSURE ENLIVE)  237 mL Oral BID BM  . HYDROmorphone   Intravenous Q4H  . insulin aspart  0-9 Units Subcutaneous TID WC  . metoprolol succinate  25 mg Oral Daily  . pantoprazole  40 mg Oral Daily   Continuous Infusions:  PRN Meds:.acetaminophen, cyclobenzaprine, diphenhydrAMINE **OR** diphenhydrAMINE, gi cocktail, hydrALAZINE, naloxone **AND** sodium chloride flush, ondansetron **OR** ondansetron (ZOFRAN) IV, ondansetron (ZOFRAN) IV  REVIEW OF SYSTEMS:    10 Point review of Systems was done is negative except as noted above.   LABORATORY DATA:  I  have reviewed the data as listed  . CBC Latest Ref Rng & Units 04/29/2016 04/28/2016 04/27/2016  WBC 4.0 - 10.5 K/uL 13.9(H) 13.4(H) 13.4(H)  Hemoglobin 13.0 - 17.0 g/dL 11.3(L) 11.3(L) 11.8(L)  Hematocrit 39.0 - 52.0 % 35.3(L) 34.6(L) 35.3(L)  Platelets 150 - 400 K/uL 509(H) 546(H) 715(H)    . CMP Latest Ref Rng & Units 04/29/2016 04/29/2016 04/28/2016  Glucose 65 - 99 mg/dL - 127(H) 105(H)  BUN 6 - 20 mg/dL - 26(H) 27(H)  Creatinine 0.61 - 1.24 mg/dL - 1.37(H) 1.65(H)  Sodium 135 - 145 mmol/L - 140 142  Potassium 3.5 - 5.1 mmol/L 4.6 5.7(H) 4.9  Chloride 101 - 111 mmol/L - 104 107  CO2 22 - 32 mmol/L - 31 29  Calcium 8.9 - 10.3 mg/dL - 9.2 9.0  Total Protein 6.5 - 8.1 g/dL - - -  Total Bilirubin 0.3 - 1.2 mg/dL - - -  Alkaline Phos 38 - 126 U/L - - -  AST 15 - 41 U/L - - -  ALT 17 - 63 U/L - - -     BCR-ABL1, CML/ALL, PCR, QUANT  Order: [id number]  Status:  Edited Result - FINAL Visible to patient:  No (Not Released) Next appt:  05/12/2016 at 03:40 PM in Cardiology Harl Bowie, Roderic Palau, MD) Dx:  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 These results do not rule out the presence of low levels of  BCR-ABL1 transcript below the level of detection of this assay, or  the presence of rare BCR-ABL1 transcripts not detected by this assay.   Director Review Adrian Saran):  Comment   Comments: (NOTE)  Constance Goltz, PhD, [facility]           Director, [place] [facility] [place] [facility] [place], [place]         [phone]         RADIOGRAPHIC STUDIES: I have personally reviewed the radiological images as listed and agreed with the findings in the report. Ct  Abdomen Pelvis Wo Contrast  Result Date: 04/23/2016 CLINICAL DATA:  LEFT posterior chest wall mass. Pain and swelling for 1 week EXAM: CT CHEST, ABDOMEN AND PELVIS WITHOUT CONTRAST TECHNIQUE: Multidetector CT imaging of the chest, abdomen and pelvis was performed following the standard protocol without IV contrast. COMPARISON:  CT 02/06/2011, 12/15/2014 FINDINGS: CT CHEST FINDINGS Cardiovascular: Coronary artery calcification and aortic atherosclerotic calcification. Mediastinum/Nodes: No axillary supraclavicular adenopathy. No mediastinal hilar adenopathy. No pericardial fluid. Esophagus Lungs/Pleura: No scattered subpleural pulmonary nodularity. These are numerous and appear benign but less than 5 mm. Musculoskeletal: Beneath the tip of the LEFT shoulder blade, there is thickening of the adjacent musculature to 4.0 x 5.0 cm (image 42, series 2). This soft tissue thickening slightly more dense than adjacent muscle stool tissue density. Lesion measures approximately 6 cm in craniocaudad dimension (image 130, series 5). No IV contrast administered. CT ABDOMEN PELVIS FINDINGS Hepatobiliary: No focal hepatic lesions noncontrast exam. Normal gallbladder. Pancreas: Normal pancreas Spleen: Spleen is enlarged measuring 21 cm in craniocaudad dimension which compares to 20 cm on CT of 12/15/2014. Adrenals/Urinary Tract: Adrenal glands are normal. LEFT kidney most inferior by the enlarged spleen. No hydronephrosis. Bladder normal. Stomach/Bowel: Stomach, small bowel appendix cecum normal. Moderate volume stool in colon. Rectum normal Vascular/Lymphatic: Abdominal aorta is normal  caliber with atherosclerotic calcification. There is no retroperitoneal or periportal lymphadenopathy. No pelvic lymphadenopathy. Reproductive: Prostate normal. Other: Large calcified injection granuloma in the LEFT buttock region measures 4 cm x 2.6 cm. Musculoskeletal: Mottled sclerotic pattern to the bones not changed from prior. IMPRESSION: Chest Impression: 1. Oblong soft tissue mass within the musculature inferior to the LEFT scapula likely represents a intramuscular hematoma. Neoplasm would be less likely but not completely excluded. 2. Coronary artery calcification and aortic atherosclerotic calcification. Abdomen / Pelvis Impression: 1. Marked splenomegaly. 2. Mottled sclerotic pattern within the bones unchanged. These results will be called to the ordering clinician or representative by the Radiologist Assistant, and communication documented in the PACS or zVision Dashboard. Electronically Signed   By: Suzy Bouchard M.D.   On: 04/23/2016 15:09   Dg Chest 1 View  Result Date: 04/27/2016 CLINICAL DATA:  Hypoxia.  Hypertension. EXAM: CHEST 1 VIEW COMPARISON:  April 15, 2016 chest radiograph and chest CT April 23, 2016 FINDINGS: There is no edema or consolidation. The heart size and pulmonary vascularity are normal. No adenopathy. There is atherosclerotic calcification aorta. No bone lesions. IMPRESSION: Aortic atherosclerosis.  No edema or consolidation. Electronically Signed   By: Lowella Grip III M.D.   On: 04/27/2016 07:13   Dg Chest 2 View  Result Date: 04/15/2016 CLINICAL DATA:  Mid and left-sided chest pain began at 3 a.m. today. Unable to raise the left arm due to pain. EXAM: CHEST  2 VIEW COMPARISON:  Portable chest x-ray dated March 31, 2016. FINDINGS: The lungs are mildly hypoinflated but clear. There is no pneumothorax or pleural effusion. The heart and pulmonary vascularity are normal. The mediastinum is normal in width. There is calcification in the wall of the thoracic  aorta. The bony thorax exhibits no acute abnormality. IMPRESSION: Mild hypoinflation.  No acute cardiopulmonary abnormality. Aortic atherosclerosis. Electronically Signed   By: David  Martinique M.D.   On: 04/15/2016 10:09   Ct Chest Wo Contrast  Result Date: 04/23/2016 CLINICAL DATA:  Left posterior chest wall mass and swelling for 1 week, dyspnea EXAM: CT CHEST WITHOUT CONTRAST TECHNIQUE: Multidetector CT imaging of the chest was performed following the standard protocol without IV contrast.  COMPARISON:  04/15/2016 MRI of the left shoulder unfortunately excluded the area of interest. FINDINGS: Cardiovascular: Top normal size cardiac chambers with coronary arteriosclerosis. No pericardial effusion. Atherosclerosis of the aortic arch and descending aorta without aneurysm. Mediastinum/Nodes: No enlarged mediastinal or axillary lymph nodes. Thyroid gland, trachea, and esophagus demonstrate no significant findings. There is slight dilatation of the distally esophagus possibly from aerophagia. Small hiatal hernia Lungs/Pleura: 2 mm nodular density adjacent to the right major fissure within the anterior right lower lobe, series 3, image 87/88. Pleural-based areas of ill-defined opacity suggestive of atelectasis and/or scarring in the left lower lobe, the largest approximately 5 mm, series 3, image 80. Mild lower lobe bronchiectasis. Tiny tree-in-bud densities along the periphery of the left lower lobe. No pneumothorax. Upper Abdomen: Splenomegaly. The spleen measures 15.3 cm AP by RO Normal adrenal glands. The unenhanced liver is unremarkable as is the pancreas for focal mass. No ductal dilatation. The gallbladder is free of stones. Normal bowel rotation. Musculoskeletal: Deep to the left trapezius muscle is a heterogeneous crescentic soft tissue abnormality suspicious for a hematoma given Hounsfield unit calculations between 50 and 60 and given the patient's clinical history of pain and swelling over the past week. This  measures up to 3 cm in thickness by 10 cm in width by 8.3 cm craniocaudad. The etiology is uncertain. Patchy bony demineralization of the dorsal spine with degenerative disc disease at T11-12 and L5-S1. No acute fracture is apparent. IMPRESSION: Heterogeneous crescentic soft tissue abnormality deep to the left trapezius muscle with Hounsfield units suggestive of hematoma with internal heterogeneity suggesting areas of probable clot. This measures approximately 10 x 3 x 8.3 cm. Follow up may prove useful to ensure stability and/or resolution as this may mask other underlying pathology. Patchy bony demineralization of the axial skeleton. Splenomegaly. Electronically Signed   By: Ashley Royalty M.D.   On: 04/23/2016 15:01   Alan Chest Wo Contrast  Result Date: 04/27/2016 CLINICAL DATA:  Evaluate left chest wall mass. EXAM: MRI CHEST WITHOUT CONTRAST TECHNIQUE: Multiplanar, multisequence Alan imaging was performed. No intravenous contrast was administered. COMPARISON:  Chest CT 04/23/2016 FINDINGS: The chest wall mass identified on the chest CT demonstrates increased T1 signal intensity most consistent with a hematoma. The actual hematoma measures a maximum of 8 x 3 cm. This likely arising in the serratus anterior muscle but there is also some hematoma outside the muscle. There is significant surrounding fluid and edema which is involving the latissimus dorsi muscle also. No scapular fracture or rib fracture. The visualized left lung is grossly clear.  No rib lesions. IMPRESSION: Large left-sided chest wall hematoma with surrounding fluid, edema and inflammation. No underlying scapular or left rib fracture. Electronically Signed   By: Marijo Sanes M.D.   On: 04/27/2016 13:03   Alan Shoulder Left Wo Contrast  Result Date: 04/16/2016 CLINICAL DATA:  Left shoulder pain without a injury. EXAM: MRI OF THE LEFT SHOULDER WITHOUT CONTRAST TECHNIQUE: Multiplanar, multisequence Alan imaging of the shoulder was performed. No  intravenous contrast was administered. COMPARISON:  None. FINDINGS: Rotator cuff: Mild tendinosis of the supraspinatus tendon with a small insertional interstitial tear. Infraspinatus tendon is intact. Teres minor tendon is intact. Subscapularis tendon is intact. Muscles: No atrophy or fatty replacement of nor abnormal signal within, the muscles of the rotator cuff. Biceps long head:  Intact. Acromioclavicular Joint: Mild arthropathy of the acromioclavicular joint. Type I acromion. Trace amount of subacromial/ subdeltoid bursal fluid. Glenohumeral Joint: No joint effusion.  Mild chondromalacia.  Labrum: Limited evaluation secondary lack of intra-articular contrast. Small posterior labral tear at the labroligamentous junction. Bones: Mild subcortical reactive marrow changes in the lesser tuberosity. No other marrow signal abnormality. No fracture or dislocation. Other: No fluid collection or hematoma. IMPRESSION: 1. Mild tendinosis of the supraspinatus tendon with a small insertional interstitial tear. 2. Mild subacromial/subdeltoid bursitis. Electronically Signed   By: Kathreen Devoid   On: 04/16/2016 08:08   Ct Biopsy  Result Date: 04/25/2016 INDICATION: 70 year old male with splenomegaly and thrombocytosis and clinical concern for myeloproliferative neoplasm. EXAM: CT GUIDED BONE MARROW ASPIRATION AND CORE BIOPSY Interventional Radiologist:  Criselda Peaches, MD MEDICATIONS: None. ANESTHESIA/SEDATION: Moderate (conscious) sedation was employed during this procedure. A total of 4 milligrams versed and 200 micrograms fentanyl were administered intravenously. The patient's level of consciousness and vital signs were monitored continuously by radiology nursing throughout the procedure under my direct supervision. Total monitored sedation time: 17 minutes FLUOROSCOPY TIME:  Fluoroscopy Time: 0 minutes 0 seconds (0 mGy). COMPLICATIONS: None immediate. Estimated blood loss: <25 mL PROCEDURE: Informed written consent  was obtained from the patient after a thorough discussion of the procedural risks, benefits and alternatives. All questions were addressed. Maximal Sterile Barrier Technique was utilized including caps, mask, sterile gowns, sterile gloves, sterile drape, hand hygiene and skin antiseptic. A timeout was performed prior to the initiation of the procedure. The patient was positioned prone and non-contrast localization CT was performed of the pelvis to demonstrate the iliac marrow spaces. Maximal barrier sterile technique utilized including caps, mask, sterile gowns, sterile gloves, large sterile drape, hand hygiene, and betadine prep. Under sterile conditions and local anesthesia, an 11 gauge coaxial bone biopsy needle was advanced into the right iliac marrow space. Needle position was confirmed with CT imaging. Initially, bone marrow aspiration was performed. Next, the 11 gauge outer cannula was utilized to obtain a right iliac bone marrow core biopsy. Needle was removed. Hemostasis was obtained with compression. The patient tolerated the procedure well. Samples were prepared with the cytotechnologist. IMPRESSION: Technically successful CT-guided core biopsy of the right iliac bone. Electronically Signed   By: Jacqulynn Cadet M.D.   On: 04/25/2016 12:47   Dg Shoulder Left  Result Date: 04/15/2016 CLINICAL DATA:  Onset of left shoulder pain this morning without known injury EXAM: LEFT SHOULDER - 2+ VIEW COMPARISON:  None in PACs FINDINGS: The bones are subjectively adequately mineralized. The AP view is suboptimally positioned. No acute fracture is observed. The glenohumeral joint space is reasonably well-maintained. The subacromial subdeltoid space is normal. A small spur arises from the inferior tip of the left clavicle. IMPRESSION: No acute bony abnormality of the left shoulder. Small spur arises from the inferior articular margin of the right clavicle and may be impacting the superior aspect of the rotator  cuff. Electronically Signed   By: David  Martinique M.D.   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 BCL-ABL in peripheral bood was negative BM Bx - non diagnostic due to poor sample collection. Cbc stable Plan -rpt CT guided Bone marrow biopsy ordered.  2) Abnormal PTT (patient not on blood thinners) ?factor inhibitor  -fviii and fix level wnl --makes fviii inhibitor -vwd panel wnl -fibrinogen- appropriately elevated. Plan -PTT Mixing study - baseline aptt wnl mixing study negative. -lupus anticoagulant  3) Left chest wall hematoma -  hgb stable. Clinically improving hematoma. ? Related to HTN vs platelet dysfunction from cymbalta vs coagulopathy. -MRI chest -consistent with hematoma no evidence of underlying tumor. Plan -monitor hgb - has remained stable. -pain mx per hospitalist -monitor and optimize htn control - not optimally controlled at this time ( an element could be from pain) -no blood thinners/NSAIDS  4) chest pain - recent positive stress test  5) CKD  From my perspective patient could be discharged once he is transitioned to po pain medication and rpt bone marrow biopsy is done. I shall see him in clinic in 1 week to discuss bone marrow biopsy results  I spent 25 minutes counseling the patient face to face. The total time spent in the appointment was 25 minutes and more than 50% was on counseling and direct patient cares.    Sullivan Lone MD [place] AAHIVMS [facility] Hematology/Oncology Physician [facility]  (Office):       [phone] (Work cell):  [phone] (Fax):           [phone]

## 2016-04-29 NOTE — Care Management Note (Signed)
Case Management Note  Patient Details  Name: [name] MRN: [id number] Date of Birth: 1945-11-14  Subjective/Objective:      71 yo admitted with Hematoma              Action/Plan: From home with spouse. Chart reviewed and CM following for DC needs.  Expected Discharge Date:   (unknown)               Expected Discharge Plan:  Home/Self Care  In-House Referral:     Discharge planning Services  CM Consult  Post Acute Care Choice:    Choice offered to:     DME Arranged:    DME Agency:     HH Arranged:    HH Agency:     Status of Service:  In process, will continue to follow  If discussed at Long Length of Stay Meetings, dates discussed:    Additional CommentsLynnell Catalan, RN 04/29/2016, 1:31 PM [phone]

## 2016-04-30 ENCOUNTER — Ambulatory Visit (HOSPITAL_COMMUNITY): Payer: Medicare Other | Admitting: Hematology

## 2016-04-30 DIAGNOSIS — N183 Chronic kidney disease, stage 3 (moderate): Secondary | ICD-10-CM

## 2016-04-30 DIAGNOSIS — T148XXA Other injury of unspecified body region, initial encounter: Secondary | ICD-10-CM

## 2016-04-30 DIAGNOSIS — J9601 Acute respiratory failure with hypoxia: Secondary | ICD-10-CM

## 2016-04-30 DIAGNOSIS — E1122 Type 2 diabetes mellitus with diabetic chronic kidney disease: Secondary | ICD-10-CM

## 2016-04-30 LAB — CBC
HCT: 33.8 % — ABNORMAL LOW (ref 39.0–52.0)
Hemoglobin: 11.2 g/dL — ABNORMAL LOW (ref 13.0–17.0)
MCH: 29.3 pg (ref 26.0–34.0)
MCHC: 33.1 g/dL (ref 30.0–36.0)
MCV: 88.5 fL (ref 78.0–100.0)
Platelets: 435 K/uL — ABNORMAL HIGH (ref 150–400)
RBC: 3.82 MIL/uL — ABNORMAL LOW (ref 4.22–5.81)
RDW: 21.4 % — ABNORMAL HIGH (ref 11.5–15.5)
WBC: 12.3 K/uL — ABNORMAL HIGH (ref 4.0–10.5)

## 2016-04-30 LAB — GLUCOSE, CAPILLARY
GLUCOSE-CAPILLARY: 129 mg/dL — AB (ref 65–99)
GLUCOSE-CAPILLARY: 162 mg/dL — AB (ref 65–99)
Glucose-Capillary: 141 mg/dL — ABNORMAL HIGH (ref 65–99)

## 2016-04-30 LAB — BASIC METABOLIC PANEL
ANION GAP: 6 (ref 5–15)
BUN: 26 mg/dL — AB (ref 6–20)
CO2: 32 mmol/L (ref 22–32)
CREATININE: 1.23 mg/dL (ref 0.61–1.24)
Calcium: 9.1 mg/dL (ref 8.9–10.3)
Chloride: 102 mmol/L (ref 101–111)
GFR, EST NON AFRICAN AMERICAN: 58 mL/min — AB (ref >60)
GLUCOSE: 125 mg/dL — AB (ref 65–99)
Potassium: 4.3 mmol/L (ref 3.5–5.1)
Sodium: 140 mmol/L (ref 135–145)

## 2016-04-30 MED ORDER — HYDROMORPHONE HCL 2 MG/ML IJ SOLN
2.0000 mg | INTRAMUSCULAR | Status: DC | PRN
  Administered 2016-04-30 – 2016-05-03 (×12): 2 mg via INTRAVENOUS
  Filled 2016-04-30 (×11): qty 1

## 2016-04-30 MED ORDER — OXYCODONE HCL 5 MG PO TABS
5.0000 mg | ORAL_TABLET | Freq: Four times a day (QID) | ORAL | Status: DC | PRN
Start: 2016-04-30 — End: 2016-05-01
  Administered 2016-04-30: 10 mg via ORAL
  Filled 2016-04-30: qty 2

## 2016-04-30 MED ORDER — HYDRALAZINE HCL 20 MG/ML IJ SOLN
10.0000 mg | Freq: Four times a day (QID) | INTRAMUSCULAR | Status: DC | PRN
  Administered 2016-04-30: 10 mg via INTRAVENOUS
  Filled 2016-04-30: qty 1

## 2016-04-30 NOTE — Care Management Important Message (Signed)
Important Message  Patient Details  Name: [name] MRN: [id number] Date of Birth: 1945-09-11   Medicare Important Message Given:  Yes    Camillo Flaming 04/30/2016, 9:44 AMImportant Message  Patient Details  Name: [name] MRN: [id number] Date of Birth: 1945/10/13   Medicare Important Message Given:  Yes    Camillo Flaming 04/30/2016, 9:44 AM

## 2016-04-30 NOTE — Progress Notes (Signed)
PROGRESS NOTE    AXIEL FJELD  WCH:[id number] DOB: [birth year] DOA: 04/25/2016 PCP: [name], MD    Brief Narrative:  [name]  is a 70 y.o. male, With history of hypertension, diabetes mellitus who came to the short stay for bone marrow biopsy, and was found to have worsening of left-sided chest pain. Patient had a CT scan done on 04/23/2016 which showed heterogenous crescentic soft tissue abnormality deep to the left trapezius muscle suggestive of hematoma. 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 He also had moderate of the shoulder on 04/16/2016 which showed mild tendinosis of the supraspinatus tendon with a small insertional interstitial tear.  Patient has noticed worsening of pain and swelling in the left posterior chest wall. Today lab work showed PTT of 43. He denies nausea vomiting or diarrhea. No shortness of breath. Denies any coughing. No fever but has been having episodes of profuse sweating intermittently  Patient was admitted for pain management and currently on Dilaudid PCA pump. Patient to have repeat bone marrow biopsy done on thursday. Hematology/ oncology following.    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 Per patient increasing size and hematoma however H&H stable. MRI consistent with hematoma.. Hematology/oncology following. Patient with significant pain and IV pain regimen was changed to reduce Dilaudid PCA pump. Patient still with complaints  of significant pain and as such will adjust doses on full dose Dilaudid PCA pump. Monitor closely for respiratory depression. Added on oxy for breakthrough pain, but he reports its not working, had to discontinue that and add IV dilaudid for breakthrough pain.   #2 acute respiratory distress with hypoxia Likely secondary to respiratory depression from narcotic pain medication. Patient alert speaking in full sentences and not in extremis. Patient with clinical improvement and on nasal cannula. Resolved. ABG obtained was consistent with respiratory depression from narcotic pain medication. Patient currently back to baseline. Monitor closely.   #3 chest pain Patient with some complaints of chest pain with both typical and atypical features. Currently chest pain-free. EKG with no significant ischemic changes noted. Chest x-ray unremarkable. Cardiac enzymes negative 3.  Continue PPI. GI cocktail when necessary.  Follow.  #4 leukocytosis and thrombocytosis Concern for myeloproliferative neoplasm/disorder. Patient status post bone marrow biopsy with aspirate predominantly with peripheral blood and poor quality per hematology oncology. Patient will likely need repeat core biopsy scheduled for tomorrow.  Hematology/oncology following and appreciate input and recommendations.  #5 elevated PTT/PT Mixing study pending. Per hematology/ oncology.  #6  Acute KIDNEY INJURY Resolved.  Stable.  #7 hypertension Suboptimal, ADDED hydralazine IV.  Continue lisinopril and metoprolol.  #8 diabetes mellitus CBGs have ranged from  100-173. Sliding scale insulin. CBG (last 3)   Recent Labs  04/30/16 0747 04/30/16 1209 04/30/16 1735  GLUCAP 141* 129* 162*    resme SSI. cbgs well controlled.    DVT prophylaxis: SCDs Code Status: Full Family Communication: Updated patient. No family at bedside. Disposition Plan: pending further eval.    Consultants:   Oncology: Dr. Irene Limbo 04/25/2016  Procedures:     CT biopsy done as outpatient 04/25/2016 per Dr. Geroge Baseman  MRI chest 04/26/2016  Antimicrobials:   None   Subjective: Still in pain.   Objective: Vitals:   04/29/16 2138 04/29/16 2306 04/30/16 0329 04/30/16 0500  BP: (!) 175/95   (!) 174/69  Pulse: 72   67  Resp: _0 Temp: 98.7 F (37.1 C)   99.1 F (37.3 C)  TempSrc: Oral   Oral  SpO2: 98% 94% 95% 94%  Weight:      Height:        Intake/Output Summary (Last 24 hours) at 04/30/16 1322 Last data filed at 04/30/16 0746  Gross per 24 hour  Intake              240 ml  Output                0 ml  Net              240 ml   Filed Weights   04/25/16 1618  Weight: 89.4 kg (197 lb)    Examination:  General exam: Alert. Speaking in full sentences. Respiratory system: Clear to auscultation anterior lung fields. Respiratory effort normal. Speaking in full sentences. Cardiovascular system: S1 & S2 heard, RRR. No JVD, murmurs, rubs, gallops or clicks. No pedal edema. Gastrointestinal system: Abdomen is nondistended, soft and nontender. No organomegaly or masses felt. Normal bowel sounds heard. Central nervous system: Alert and oriented. No focal neurological deficits. Extremities: Symmetric 5 x 5 power. Skin: Left upper back/scapular region with tender soft tissue swelling. No rashes, lesions or ulcers Psychiatry: Judgement and insight appear normal. Mood & affect appropriate.     Data Reviewed: I have personally reviewed following labs and imaging studies  CBC:  Recent Labs Lab 04/26/16 0423 04/27/16 0413 04/28/16 0345 04/29/16 0341 04/30/16 0405  WBC 14.2* 13.4* 13.4* 13.9* 12.3*  NEUTROABS  --  10.6* 9.8*  --   --   HGB 11.5* 11.8* 11.3* 11.3* 11.2*  HCT 35.0* 35.3* 34.6* 35.3* 33.8*  MCV 90.7 88.7 90.3 91.2 88.5  PLT 589* 715* 546* 509* 102*   Basic Metabolic Panel:  Recent Labs Lab 04/26/16 0423 04/27/16 0413 04/28/16 0345 04/29/16 0341 04/29/16 0832 04/30/16 0405  NA 138 138 142 140   --  140  K 4.3 4.5 4.9 5.7* 4.6 4.3  CL 107 107 107 104  --  102  CO2 _1 --  32  GLUCOSE 117* 197* 105* 127*  --  125*  BUN 17 21* 27* 26*  --  26*  CREATININE 1.42* 1.49* 1.65* 1.37*  --  1.23  CALCIUM 8.8* 8.7* 9.0 9.2  --  9.1   GFR: Estimated Creatinine Clearance: 61.3 mL/min (by C-G formula based on SCr of 1.23 mg/dL). Liver Function Tests:  Recent Labs Lab 04/26/16 0423  AST 29  ALT 19  ALKPHOS 125  BILITOT 0.7  PROT 6.2*  ALBUMIN 3.2*   No results for input(s): LIPASE, AMYLASE in the last 168 hours. No results for input(s): AMMONIA in the last 168 hours. Coagulation Profile:  Recent Labs Lab 04/25/16 0915  INR 1.09   Cardiac Enzymes:  Recent Labs Lab 04/27/16 0908 04/27/16 1515 04/27/16 2054  TROPONINI <0.03 <0.03 <0.03   BNP (last 3 results) No results for input(s): PROBNP in the last 8760 hours. HbA1C: No results for input(s): HGBA1C in the last 72 hours. CBG:  Recent Labs Lab 04/29/16 1132  04/29/16 1657 04/29/16 2130 04/30/16 0747 04/30/16 1209  GLUCAP 135* 100* 108* 141* 129*   Lipid Profile: No results for input(s): CHOL, HDL, LDLCALC, TRIG, CHOLHDL, LDLDIRECT in the last 72 hours. Thyroid Function Tests: No results for input(s): TSH, T4TOTAL, FREET4, T3FREE, THYROIDAB in the last 72 hours. Anemia Panel: No results for input(s): VITAMINB12, FOLATE, FERRITIN, TIBC, IRON, RETICCTPCT in the last 72 hours. Sepsis Labs: No results for input(s): PROCALCITON, LATICACIDVEN in the last 168 hours.  Recent Results (from the past 240 hour(s))  Culture, Urine     Status: None   Collection Time: 04/26/16 11:05 AM  Result Value Ref Range Status   Specimen Description URINE, RANDOM  Final   Special Requests NONE  Final   Culture NO GROWTH Performed at [facility]   Final   Report Status 04/27/2016 FINAL  Final         Radiology Studies: No results found.      Scheduled Meds: . DULoxetine  120 mg Oral Daily  .  feeding supplement (ENSURE ENLIVE)  237 mL Oral BID BM  . HYDROmorphone   Intravenous Q4H  . insulin aspart  0-9 Units Subcutaneous TID WC  . metoprolol succinate  25 mg Oral Daily  . pantoprazole  40 mg Oral Daily   Continuous Infusions:     LOS: 4 days    Time spent: 35 minutes    Kodey Xue, MD [facility] Pager [phone]  If 7PM-7AM, please contact night-coverage [email or web address] Password [email or web address] 04/30/2016, 1:22 PM

## 2016-05-01 ENCOUNTER — Encounter (HOSPITAL_COMMUNITY): Payer: Self-pay | Admitting: Radiology

## 2016-05-01 ENCOUNTER — Inpatient Hospital Stay (HOSPITAL_COMMUNITY): Payer: Medicare Other

## 2016-05-01 DIAGNOSIS — D471 Chronic myeloproliferative disease: Secondary | ICD-10-CM

## 2016-05-01 LAB — CBC WITH DIFFERENTIAL/PLATELET
BAND NEUTROPHILS: 9 %
BASOS ABS: 0.2 10*3/uL — AB (ref 0.0–0.1)
BLASTS: 8 %
Basophils Relative: 1 %
EOS ABS: 0.9 10*3/uL — AB (ref 0.0–0.7)
Eosinophils Relative: 6 %
HEMATOCRIT: 36.3 % — AB (ref 39.0–52.0)
HEMOGLOBIN: 11.4 g/dL — AB (ref 13.0–17.0)
LYMPHS PCT: 9 %
Lymphs Abs: 1.4 10*3/uL (ref 0.7–4.0)
MCH: 28.8 pg (ref 26.0–34.0)
MCHC: 31.4 g/dL (ref 30.0–36.0)
MCV: 91.7 fL (ref 78.0–100.0)
METAMYELOCYTES PCT: 5 %
Monocytes Absolute: 0.3 10*3/uL (ref 0.1–1.0)
Monocytes Relative: 2 %
Myelocytes: 22 %
Neutro Abs: 11.3 10*3/uL — ABNORMAL HIGH (ref 1.7–7.7)
Neutrophils Relative %: 37 %
Other: 0 %
PROMYELOCYTES ABS: 1 %
Platelets: 383 10*3/uL (ref 150–400)
RBC: 3.96 MIL/uL — AB (ref 4.22–5.81)
RDW: 21.6 % — ABNORMAL HIGH (ref 11.5–15.5)
WBC: 15.3 10*3/uL — AB (ref 4.0–10.5)
nRBC: 0 /100 WBC

## 2016-05-01 LAB — GLUCOSE, CAPILLARY
GLUCOSE-CAPILLARY: 126 mg/dL — AB (ref 65–99)
GLUCOSE-CAPILLARY: 127 mg/dL — AB (ref 65–99)
GLUCOSE-CAPILLARY: 162 mg/dL — AB (ref 65–99)

## 2016-05-01 LAB — BONE MARROW EXAM

## 2016-05-01 MED ORDER — FENTANYL CITRATE (PF) 100 MCG/2ML IJ SOLN
INTRAMUSCULAR | Status: AC
  Filled 2016-05-01: qty 4

## 2016-05-01 MED ORDER — MIDAZOLAM HCL 2 MG/2ML IJ SOLN
INTRAMUSCULAR | Status: AC
  Filled 2016-05-01: qty 6

## 2016-05-01 MED ORDER — MIDAZOLAM HCL 2 MG/2ML IJ SOLN
INTRAMUSCULAR | Status: AC | PRN
  Administered 2016-05-01 (×3): 1 mg via INTRAVENOUS

## 2016-05-01 MED ORDER — FENTANYL CITRATE (PF) 100 MCG/2ML IJ SOLN
INTRAMUSCULAR | Status: AC | PRN
  Administered 2016-05-01: 50 ug via INTRAVENOUS

## 2016-05-01 NOTE — Procedures (Signed)
S/p LT ILIAC BM ASP AND CORE  NO COMP STABLE PATH PENDING FULL REPORT IN PACS

## 2016-05-01 NOTE — Progress Notes (Signed)
PROGRESS NOTE    [name]  TGG:[id number] DOB: [birth year] DOA: 04/25/2016 PCP: [name], MD    Brief Narrative:  [name]  is a 70 y.o. male, With history of hypertension, diabetes mellitus who came to the short stay for bone marrow biopsy, and was found to have worsening of left-sided chest pain. Patient had a CT scan done on 04/23/2016 which showed heterogenous crescentic soft tissue abnormality deep to the left trapezius muscle suggestive of hematoma. 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 He also had moderate of the shoulder on 04/16/2016 which showed mild tendinosis of the supraspinatus tendon with a small insertional interstitial tear.  Patient has noticed worsening of pain and swelling in the left posterior chest wall. Today lab work showed PTT of 43. He denies nausea vomiting or diarrhea. No shortness of breath. Denies any coughing. No fever but has been having episodes of profuse sweating intermittently  Patient was admitted for pain management and currently on Dilaudid PCA pump. Bone marrow biopsy done today.  Hematology/ oncology following.    Assessment & Plan:   Principal Problem:   Hematoma Active Problems:   Diabetes mellitus (HCC)   Chest pain   CKD (chronic kidney disease) stage 3, GFR 30-59 ml/min   Mass of chest wall, left   Abnormal partial thromboplastin time (PTT)   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HCC)   Acute respiratory failure with hypoxia (HCC)   Hypoxia   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[place])  #1 left posterior chest wall/upper back hematoma Questionable etiology. Per patient increasing size and hematoma however H&H stable. MRI consistent with hematoma.. Hematology/oncology following. Patient with significant pain and IV pain regimen was changed to reduce Dilaudid PCA pump. pt continues to report persistent pain, added IV diluadid  for breakthrough pain .  Monitor closely for respiratory depression.   #2 acute respiratory distress with hypoxia Likely secondary to respiratory depression from narcotic pain medication. Resolved. Patient currently back to baseline. Monitor closely.   #3 chest pain Patient with some complaints of chest pain with both typical and atypical features. Currently chest pain-free. EKG with no significant ischemic changes noted. Chest x-ray unremarkable. Cardiac enzymes negative 3.  Continue PPI. GI cocktail when necessary.  No chest pain today.  #4 leukocytosis and thrombocytosis Concern for myeloproliferative neoplasm/disorder. Patient status post bone marrow biopsy with aspirate predominantly with peripheral blood and poor quality per hematology oncology. Repeat bone marrow biopsy done today. Will follow the results.  Hematology/oncology following and appreciate input and recommendations.  #5 elevated PTT/PT Mixing study pending. Per hematology/ oncology.  #6  Acute KIDNEY INJURY Resolved.  Stable.  #7 hypertension Better controlled today.  Continue lisinopril and metoprolol.  #8 diabetes mellitus CBGs have ranged from  100-173. Sliding scale insulin. CBG (last 3)   Recent Labs  04/30/16 1735 05/01/16 0753 05/01/16 1215  GLUCAP 162* 126* 127*    resme SSI. cbgs well controlled.    DVT prophylaxis: SCDs Code Status: Full Family Communication: Updated patient. No family at bedside. Disposition Plan: pending further eval.    Consultants:   Oncology: Dr. Irene Limbo 04/25/2016  Procedures:   CT biopsy done as outpatient 04/25/2016 per Dr. Geroge Baseman  MRI chest 04/26/2016  Antimicrobials:   None   Subjective: Frustrated that he hasn't eaten all day today because of the procedure.  No new complaints.   Objective: Vitals:   05/01/16 0911 05/01/16 0916 05/01/16 0921 05/01/16 0951  BP: (!) 156/76 Marland Kitchen)  147/77 (!) 154/81   Pulse: 75 74 73   Resp: '14 11 14 15  '$ Temp:        TempSrc:      SpO2: 98% 98% 96% 96%  Weight:      Height:        Intake/Output Summary (Last 24 hours) at 05/01/16 1309 Last data filed at 05/01/16 1015  Gross per 24 hour  Intake              840 ml  Output                0 ml  Net              840 ml   Filed Weights   04/25/16 1618  Weight: 89.4 kg (197 lb)    Examination:  General exam: Alert. Speaking in full sentences. Respiratory system: Clear to auscultation anterior lung fields. Respiratory effort normal. Speaking in full sentences. Cardiovascular system: S1 & S2 heard, RRR. No JVD, murmurs, rubs, gallops or clicks. No pedal edema. Gastrointestinal system: Abdomen is nondistended, soft and nontender. No organomegaly or masses felt. Normal bowel sounds heard. Central nervous system: Alert and oriented. No focal neurological deficits. Extremities: Symmetric 5 x 5 power. Skin: Left upper back/scapular region with tender soft tissue swelling. No rashes, lesions or ulcers Psychiatry: Judgement and insight appear normal. Mood & affect appropriate.     Data Reviewed: I have personally reviewed following labs and imaging studies  CBC:  Recent Labs Lab 04/27/16 0413 04/28/16 0345 04/29/16 0341 04/30/16 0405 05/01/16 1119  WBC 13.4* 13.4* 13.9* 12.3* 15.3*  NEUTROABS 10.6* 9.8*  --   --  11.3*  HGB 11.8* 11.3* 11.3* 11.2* 11.4*  HCT 35.3* 34.6* 35.3* 33.8* 36.3*  MCV 88.7 90.3 91.2 88.5 91.7  PLT 715* 546* 509* 435* 401   Basic Metabolic Panel:  Recent Labs Lab 04/26/16 0423 04/27/16 0413 04/28/16 0345 04/29/16 0341 04/29/16 0832 04/30/16 0405  NA 138 138 142 140  --  140  K 4.3 4.5 4.9 5.7* 4.6 4.3  CL 107 107 107 104  --  102  CO2 '26 25 29 31  '$ --  32  GLUCOSE 117* 197* 105* 127*  --  125*  BUN 17 21* 27* 26*  --  26*  CREATININE 1.42* 1.49* 1.65* 1.37*  --  1.23  CALCIUM 8.8* 8.7* 9.0 9.2  --  9.1   GFR: Estimated Creatinine Clearance: 61.3 mL/min (by C-G formula based on SCr of 1.23  mg/dL). Liver Function Tests:  Recent Labs Lab 04/26/16 0423  AST 29  ALT 19  ALKPHOS 125  BILITOT 0.7  PROT 6.2*  ALBUMIN 3.2*   No results for input(s): LIPASE, AMYLASE in the last 168 hours. No results for input(s): AMMONIA in the last 168 hours. Coagulation Profile:  Recent Labs Lab 04/25/16 0915  INR 1.09   Cardiac Enzymes:  Recent Labs Lab 04/27/16 0908 04/27/16 1515 04/27/16 2054  TROPONINI <0.03 <0.03 <0.03   BNP (last 3 results) No results for input(s): PROBNP in the last 8760 hours. HbA1C: No results for input(s): HGBA1C in the last 72 hours. CBG:  Recent Labs Lab 04/30/16 0747 04/30/16 1209 04/30/16 1735 05/01/16 0753 05/01/16 1215  GLUCAP 141* 129* 162* 126* 127*   Lipid Profile: No results for input(s): CHOL, HDL, LDLCALC, TRIG, CHOLHDL, LDLDIRECT in the last 72 hours. Thyroid Function Tests: No results for input(s): TSH, T4TOTAL, FREET4, T3FREE, THYROIDAB in the last 72 hours. Anemia Panel:  No results for input(s): VITAMINB12, FOLATE, FERRITIN, TIBC, IRON, RETICCTPCT in the last 72 hours. Sepsis Labs: No results for input(s): PROCALCITON, LATICACIDVEN in the last 168 hours.  Recent Results (from the past 240 hour(s))  Culture, Urine     Status: None   Collection Time: 04/26/16 11:05 AM  Result Value Ref Range Status   Specimen Description URINE, RANDOM  Final   Special Requests NONE  Final   Culture NO GROWTH Performed at [facility]   Final   Report Status 04/27/2016 FINAL  Final         Radiology Studies: Ct Biopsy  Result Date: 05-08-2016 INDICATION: Leukocytosis, thrombocytopenia, concern for myeloproliferative process. EXAM: CT GUIDED LEFT ILIAC BONE MARROW ASPIRATION AND CORE BIOPSY Date:  19-Oct-201710/19/2017 10:06 am Radiologist:  M. Daryll Brod, MD Guidance:  CT FLUOROSCOPY TIME:  Fluoroscopy Time: None. MEDICATIONS: None. ANESTHESIA/SEDATION: 3.0 mg IV Versed; 50 mcg IV Fentanyl Moderate Sedation Time:  16  minutes The patient was continuously monitored during the procedure by the interventional radiology nurse under my direct supervision. CONTRAST:  None. COMPLICATIONS: None PROCEDURE: Informed consent was obtained from the patient following explanation of the procedure, risks, benefits and alternatives. The patient understands, agrees and consents for the procedure. All questions were addressed. A time out was performed. The patient was positioned prone and non-contrast localization CT was performed of the pelvis to demonstrate the iliac marrow spaces. Maximal barrier sterile technique utilized including caps, mask, sterile gowns, sterile gloves, large sterile drape, hand hygiene, and Betadine prep. Under sterile conditions and local anesthesia, an 11 gauge coaxial bone biopsy needle was advanced into the left iliac marrow space. Needle position was confirmed with CT imaging. Initially, bone marrow aspiration was performed. Next, the 11 gauge outer cannula was utilized to obtain a left iliac bone marrow core biopsy. Needle was removed. Hemostasis was obtained with compression. The patient tolerated the procedure well. Samples were prepared with the cytotechnologist. No immediate complications. IMPRESSION: CT guided left iliac bone marrow aspiration and core biopsy. Electronically Signed   By: Jerilynn Mages.  Shick M.D.   On: 08-May-2016 10:19        Scheduled Meds: . DULoxetine  120 mg Oral Daily  . feeding supplement (ENSURE ENLIVE)  237 mL Oral BID BM  . HYDROmorphone   Intravenous Q4H  . insulin aspart  0-9 Units Subcutaneous TID WC  . metoprolol succinate  25 mg Oral Daily  . pantoprazole  40 mg Oral Daily   Continuous Infusions:     LOS: 5 days    Time spent: 25 minutes    Lashawnda Hancox, MD [facility] Pager [phone]  If 7PM-7AM, please contact night-coverage [email or web address] Password [email or web address] 2016-05-08, 1:09 PM

## 2016-05-01 NOTE — Progress Notes (Signed)
Nutrition Follow-up  DOCUMENTATION CODES:   Severe malnutrition in context of acute illness/injury  INTERVENTION:  Continue Ensure Enlive po BID, each supplement provides 350 kcal and 20 grams of protein.  RD will continue to follow and monitor for nutrition-related needs.   NUTRITION DIAGNOSIS:   Malnutrition (Severe) related to acute illness as evidenced by energy intake < or equal to 50% for > or equal to 5 days, percent weight loss.  Ongoing.  GOAL:   Patient will meet greater than or equal to 90% of their needs  Not met.  MONITOR:   PO intake, Supplement acceptance, Weight trends, Labs, I & O's  REASON FOR ASSESSMENT:   Malnutrition Screening Tool    ASSESSMENT:   70 y.o.male,With history of hypertension, diabetes mellitus who came to the short stay for bone marrow biopsy, and was found to have worsening of left-sided chest pain. Patient had a CT scan done on 04/23/2016 which showed heterogenous crescentic soft tissue abnormality deep to the left trapezius muscle suggestive of hematoma.  Original bone marrow biopsy predominantly peripheral blood and poor quality per hem/onc. Patient now s/p repeat bone marrow biopsy today. Pending results.  Patient reports his appetite is not great but it is getting back to normal slowly. He missed breakfast today while NPO for repeat bone marrow biopsy, but had finished 100% of lunch at time of assessment. He reports that wife brought him hamburger and fries last night for dinner. He has been drinking 1-2 Ensure Enlive daily and reports he will continue to drink them at home, too.   Meal Completion: 50-100% per chart and patient report. In the past 24 hrs, patient has had 1462 kcal (73% minimum estimated kcal needs) and 55 grams protein (61% minimum estimated protein needs).   Medications reviewed and include: Novolog sliding scale TID with meals, pantoprazole, fentanyl prn, Versed prn.  Labs reviewed: CBG 126-162 past 24 hrs. No  Chem Profile today.  Diet Order:  Diet regular Room service appropriate? Yes; Fluid consistency: Thin  Skin:  Reviewed, no issues  Last BM:  04/30/2016  Height:   Ht Readings from Last 1 Encounters:  04/25/16 6' (1.829 m)    Weight:   Wt Readings from Last 1 Encounters:  04/25/16 197 lb (89.4 kg)    Ideal Body Weight:  80.91 kg  BMI:  Body mass index is 26.72 kg/m.  Estimated Nutritional Needs:   Kcal:  2000-2200  Protein:  90-110 grams  Fluid:  2-2.2 L/day  EDUCATION NEEDS:   No education needs identified at this time  Willey Blade, MS, RD, LDN Pager: [phone] After Hours Pager: [phone]

## 2016-05-01 NOTE — Progress Notes (Signed)
Patient returned from IR. Will continue to monitor.

## 2016-05-02 DIAGNOSIS — I10 Essential (primary) hypertension: Secondary | ICD-10-CM

## 2016-05-02 DIAGNOSIS — D721 Eosinophilia: Secondary | ICD-10-CM

## 2016-05-02 DIAGNOSIS — R740 Nonspecific elevation of levels of transaminase and lactic acid dehydrogenase [LDH]: Secondary | ICD-10-CM

## 2016-05-02 LAB — LUPUS ANTICOAGULANT PANEL
DRVVT: 42.5 s (ref 0.0–47.0)
PTT LA: 57.2 s — AB (ref 0.0–51.9)

## 2016-05-02 LAB — GLUCOSE, CAPILLARY
GLUCOSE-CAPILLARY: 142 mg/dL — AB (ref 65–99)
GLUCOSE-CAPILLARY: 121 mg/dL — AB (ref 65–99)
Glucose-Capillary: 205 mg/dL — ABNORMAL HIGH (ref 65–99)
Glucose-Capillary: 100 mg/dL — ABNORMAL HIGH (ref 65–99)

## 2016-05-02 LAB — HEXAGONAL PHASE PHOSPHOLIPID: HEXAGONAL PHASE PHOSPHOLIPID: 2 s (ref 0–11)

## 2016-05-02 LAB — PTT-LA MIX: PTT-LA Mix: 53.2 s — ABNORMAL HIGH (ref 0.0–48.9)

## 2016-05-02 MED ORDER — LORAZEPAM 1 MG PO TABS: 1.0000 mg | ORAL_TABLET | Freq: Three times a day (TID) | ORAL | Status: DC | PRN

## 2016-05-02 NOTE — Progress Notes (Addendum)
PROGRESS NOTE    [name]  NTI:[id number] DOB: 1945/11/06 DOA: 04/25/2016 PCP: [name], MD    Brief Narrative:  [name]  is a 70 y.o. male, With history of hypertension, diabetes mellitus who came to the short stay for bone marrow biopsy, and was found to have worsening of left-sided chest pain. Patient had a CT scan done on 04/23/2016 which showed heterogenous crescentic soft tissue abnormality deep to the left trapezius muscle suggestive of hematoma. 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 He also had moderate of the shoulder on 04/16/2016 which showed mild tendinosis of the supraspinatus tendon with a small insertional interstitial tear.  Patient has noticed worsening of pain and swelling in the left posterior chest wall. Today lab work showed PTT of 43. He denies nausea vomiting or diarrhea. No shortness of breath. Denies any coughing. No fever but has been having episodes of profuse sweating intermittently  Patient was admitted for pain management and currently on Dilaudid PCA pump. Bone marrow biopsy done today.  Hematology/ oncology following.    Assessment & Plan:   Principal Problem:   Hematoma Active Problems:   Diabetes mellitus (HCC)   Chest pain   CKD (chronic kidney disease) stage 3, GFR 30-59 ml/min   Mass of chest wall, left   Abnormal partial thromboplastin time (PTT)   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HCC)   Acute respiratory failure with hypoxia (HCC)   Hypoxia   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[place])  #1 left posterior chest wall/upper back hematoma Questionable etiology. Per patient increasing size and hematoma however H&H stable. MRI consistent with hematoma.. Hematology/oncology following. Patient with significant pain and IV pain regimen was changed to reduce Dilaudid PCA pump. pt continues to report persistent pain, added IV diluadid  for breakthrough pain . He also appears to be very anxious and ativan was added.   Monitor closely for respiratory depression.   #2 acute respiratory distress with hypoxia Likely secondary to respiratory depression from narcotic pain medication. Resolved. Patient currently back to baseline. Monitor closely.   #3 chest pain Patient with some complaints of chest pain with both typical and atypical features. Currently chest pain-free. EKG with no significant ischemic changes noted. Chest x-ray unremarkable. Cardiac enzymes negative 3.  Continue PPI. GI cocktail when necessary.  No chest pain today. He is scheduled for cardiac catheterization in the near future with cardiology.   #4 leukocytosis and thrombocytosis Concern for myeloproliferative neoplasm/disorder. Patient status post bone marrow biopsy with aspirate predominantly with peripheral blood and poor quality per hematology oncology. Repeat bone marrow biopsy done 10/12. Will follow the results.  Hematology/oncology following and appreciate input and recommendations. His platelet count is normal at 383 and his wbc count still elevated at 15.3. Continue to monitor. [place] labs ordered by oncology..     #5 elevated PTT/PT Mixing study done.  Per hematology/ oncology.  #6  Acute KIDNEY INJURY Resolved.  Stable.  #7 hypertension Better controlled today.  Continue lisinopril and metoprolol.  #8 diabetes mellitus CBGs have ranged from   100 to 200's. . Sliding scale insulin. CBG (last 3)   Recent Labs  05/02/16 0855 05/02/16 1237 05/02/16 1826  GLUCAP 205* 100* 142*    resme SSI. cbgs well controlled.    DVT prophylaxis: SCDs Code Status: Full Family Communication: Updated patient. No family at bedside. Disposition Plan: home in the next 1to 2 days once pain is well controlled.    Consultants:  Oncology: Dr. Irene Limbo 04/25/2016  Procedures:   CT biopsy done as outpatient 04/25/2016 per Dr. Geroge Baseman  MRI chest  04/26/2016  Antimicrobials:   None   Subjective: His back pain is easing off.   Objective: Vitals:   05/02/16 0837 05/02/16 1015 05/02/16 1326 05/02/16 1633  BP:  (!) 152/75    Pulse:  71    Resp: '15 13 14 11  '$ Temp:      TempSrc:      SpO2: 90% 96% 96% 98%  Weight:      Height:        Intake/Output Summary (Last 24 hours) at 05/02/16 1943 Last data filed at 05/02/16 1900  Gross per 24 hour  Intake              480 ml  Output                0 ml  Net              480 ml   Filed Weights   04/25/16 1618  Weight: 89.4 kg (197 lb)    Examination:  General exam: Alert. Speaking in full sentences. Respiratory system: Clear to auscultation anterior lung fields. Respiratory effort normal. Speaking in full sentences. Cardiovascular system: S1 & S2 heard, RRR. No JVD, murmurs, rubs, gallops or clicks. No pedal edema. Gastrointestinal system: Abdomen is nondistended, soft and nontender. No organomegaly or masses felt. Normal bowel sounds heard. Central nervous system: Alert and oriented. No focal neurological deficits. Extremities: Symmetric 5 x 5 power. Skin: Left upper back/scapular region with tender soft tissue swelling. No rashes, lesions or ulcers Psychiatry: Judgement and insight appear normal. Mood & affect appropriate.     Data Reviewed: I have personally reviewed following labs and imaging studies  CBC:  Recent Labs Lab 04/27/16 0413 04/28/16 0345 04/29/16 0341 04/30/16 0405 05/01/16 1119  WBC 13.4* 13.4* 13.9* 12.3* 15.3*  NEUTROABS 10.6* 9.8*  --   --  11.3*  HGB 11.8* 11.3* 11.3* 11.2* 11.4*  HCT 35.3* 34.6* 35.3* 33.8* 36.3*  MCV 88.7 90.3 91.2 88.5 91.7  PLT 715* 546* 509* 435* 665   Basic Metabolic Panel:  Recent Labs Lab 04/26/16 0423 04/27/16 0413 04/28/16 0345 04/29/16 0341 04/29/16 0832 04/30/16 0405  NA 138 138 142 140  --  140  K 4.3 4.5 4.9 5.7* 4.6 4.3  CL 107 107 107 104  --  102  CO2 '26 25 29 31  '$ --  32  GLUCOSE 117* 197*  105* 127*  --  125*  BUN 17 21* 27* 26*  --  26*  CREATININE 1.42* 1.49* 1.65* 1.37*  --  1.23  CALCIUM 8.8* 8.7* 9.0 9.2  --  9.1   GFR: Estimated Creatinine Clearance: 61.3 mL/min (by C-G formula based on SCr of 1.23 mg/dL). Liver Function Tests:  Recent Labs Lab 04/26/16 0423  AST 29  ALT 19  ALKPHOS 125  BILITOT 0.7  PROT 6.2*  ALBUMIN 3.2*   No results for input(s): LIPASE, AMYLASE in the last 168 hours. No results for input(s): AMMONIA in the last 168 hours. Coagulation Profile: No results for input(s): INR, PROTIME in the last 168 hours. Cardiac Enzymes:  Recent Labs Lab 04/27/16 0908 04/27/16 1515 04/27/16 2054  TROPONINI <0.03 <0.03 <0.03   BNP (last 3 results) No results for input(s): PROBNP in the last 8760 hours. HbA1C: No results for input(s): HGBA1C in the last 72 hours. CBG:  Recent Labs Lab 05/01/16 1215 05/01/16 1745  05/02/16 0855 05/02/16 1237 05/02/16 1826  GLUCAP 127* 162* 205* 100* 142*   Lipid Profile: No results for input(s): CHOL, HDL, LDLCALC, TRIG, CHOLHDL, LDLDIRECT in the last 72 hours. Thyroid Function Tests: No results for input(s): TSH, T4TOTAL, FREET4, T3FREE, THYROIDAB in the last 72 hours. Anemia Panel: No results for input(s): VITAMINB12, FOLATE, FERRITIN, TIBC, IRON, RETICCTPCT in the last 72 hours. Sepsis Labs: No results for input(s): PROCALCITON, LATICACIDVEN in the last 168 hours.  Recent Results (from the past 240 hour(s))  Culture, Urine     Status: None   Collection Time: 04/26/16 11:05 AM  Result Value Ref Range Status   Specimen Description URINE, RANDOM  Final   Special Requests NONE  Final   Culture NO GROWTH Performed at [facility]   Final   Report Status 04/27/2016 FINAL  Final         Radiology Studies: Ct Biopsy  Result Date: May 24, 2016 INDICATION: Leukocytosis, thrombocytopenia, concern for myeloproliferative process. EXAM: CT GUIDED LEFT ILIAC BONE MARROW ASPIRATION AND CORE  BIOPSY Date:  11/04/201711/04/17 10:06 am Radiologist:  M. Daryll Brod, MD Guidance:  CT FLUOROSCOPY TIME:  Fluoroscopy Time: None. MEDICATIONS: None. ANESTHESIA/SEDATION: 3.0 mg IV Versed; 50 mcg IV Fentanyl Moderate Sedation Time:  16 minutes The patient was continuously monitored during the procedure by the interventional radiology nurse under my direct supervision. CONTRAST:  None. COMPLICATIONS: None PROCEDURE: Informed consent was obtained from the patient following explanation of the procedure, risks, benefits and alternatives. The patient understands, agrees and consents for the procedure. All questions were addressed. A time out was performed. The patient was positioned prone and non-contrast localization CT was performed of the pelvis to demonstrate the iliac marrow spaces. Maximal barrier sterile technique utilized including caps, mask, sterile gowns, sterile gloves, large sterile drape, hand hygiene, and Betadine prep. Under sterile conditions and local anesthesia, an 11 gauge coaxial bone biopsy needle was advanced into the left iliac marrow space. Needle position was confirmed with CT imaging. Initially, bone marrow aspiration was performed. Next, the 11 gauge outer cannula was utilized to obtain a left iliac bone marrow core biopsy. Needle was removed. Hemostasis was obtained with compression. The patient tolerated the procedure well. Samples were prepared with the cytotechnologist. No immediate complications. IMPRESSION: CT guided left iliac bone marrow aspiration and core biopsy. Electronically Signed   By: Jerilynn Mages.  Shick M.D.   On: May 24, 2016 10:19        Scheduled Meds: . DULoxetine  120 mg Oral Daily  . feeding supplement (ENSURE ENLIVE)  237 mL Oral BID BM  . HYDROmorphone   Intravenous Q4H  . insulin aspart  0-9 Units Subcutaneous TID WC  . metoprolol succinate  25 mg Oral Daily  . pantoprazole  40 mg Oral Daily   Continuous Infusions:     LOS: 6 days    Time spent: 25  minutes    Kynlei Piontek, MD [facility] Pager [phone]  If 7PM-7AM, please contact night-coverage [email or web address] Password [email or web address] 05/02/2016, 7:43 PM

## 2016-05-03 ENCOUNTER — Other Ambulatory Visit: Payer: Self-pay | Admitting: Hematology

## 2016-05-03 LAB — GLUCOSE, CAPILLARY
GLUCOSE-CAPILLARY: 112 mg/dL — AB (ref 65–99)
GLUCOSE-CAPILLARY: 119 mg/dL — AB (ref 65–99)
Glucose-Capillary: 164 mg/dL — ABNORMAL HIGH (ref 65–99)
Glucose-Capillary: 130 mg/dL — ABNORMAL HIGH (ref 65–99)

## 2016-05-03 LAB — LACTATE DEHYDROGENASE: LDH: 713 U/L — AB (ref 98–192)

## 2016-05-03 MED ORDER — DULOXETINE HCL 60 MG PO CPEP
90.0000 mg | ORAL_CAPSULE | Freq: Every day | ORAL | Status: DC
  Administered 2016-05-04: 90 mg via ORAL
  Filled 2016-05-03: qty 1

## 2016-05-03 MED ORDER — ALPRAZOLAM 0.5 MG PO TABS
0.5000 mg | ORAL_TABLET | Freq: Two times a day (BID) | ORAL | Status: DC | PRN
  Administered 2016-05-03 – 2016-05-04 (×2): 0.5 mg via ORAL
  Filled 2016-05-03 (×2): qty 1

## 2016-05-03 MED ORDER — LIDOCAINE 5 % EX PTCH
1.0000 | MEDICATED_PATCH | CUTANEOUS | Status: DC
  Administered 2016-05-03 – 2016-05-04 (×2): 1 via TRANSDERMAL
  Filled 2016-05-03 (×2): qty 1

## 2016-05-03 MED ORDER — HYDROMORPHONE HCL 1 MG/ML IJ SOLN
1.0000 mg | INTRAMUSCULAR | Status: DC | PRN
  Administered 2016-05-03 – 2016-05-04 (×9): 2 mg via INTRAVENOUS
  Filled 2016-05-03 (×10): qty 2

## 2016-05-03 NOTE — Progress Notes (Signed)
Marland Kitchen   HEMATOLOGY/ONCOLOGY INPATIENT PROGRESS NOTE  Date of Service: 05/02/2016  Inpatient Attending: .Hosie Poisson, MD   SUBJECTIVE  Patient had a repeat bone marrow biopsy again yesterday which went quite smooth. He is being transitioned to PO pain medications. Blood counts stable.  No other acute new symptoms. Chest wall pain getting better thought pain still notes significant pain over the hematoma site - somewhat out of proportion to clinical findings.   OBJECTIVE:  Mild distress from pain  PHYSICAL EXAMINATION: . Vitals:   05/03/16 0400 05/03/16 0515 05/03/16 0748 05/03/16 0931  BP:  (!) 171/84  (!) 165/75  Pulse:  76  70  Resp: (!) '8 12 11 12  '$ Temp:  97.7 F (36.5 C)  98.2 F (36.8 C)  TempSrc:  Oral  Oral  SpO2: 93% 98% 97% 96%  Weight:      Height:       Filed Weights   04/25/16 1618  Weight: 197 lb (89.4 kg)   .Body mass index is 26.72 kg/m.  Marland Kitchen72 kg/m.  GENERAL:alert, in no acute distress and comfortable SKIN: skin color, texture, turgor are normal, no rashes or significant lesions EYES: normal, conjunctiva are pink and non-injected, sclera clear OROPHARYNX:no exudate, no erythema and lips, buccal mucosa, and tongue normal  NECK: supple, no JVD, thyroid normal size, non-tender, without nodularity LYMPH:  no palpable lymphadenopathy in the cervical, axillary or inguinal LUNGS: clear to auscultation with normal respiratory effort, left posterior chest wall hematoma appears to be getting somewhat softer and smaller.Marland Kitchen HEART: regular rate & rhythm,  no murmurs and no lower extremity edema ABDOMEN: No guarding rigidity or rebound. Large splenomegaly. Musculoskeletal: no cyanosis of digits and no clubbing  PSYCH: alert & oriented x 3 with fluent speech NEURO: no focal motor/sensory deficits  MEDICAL HISTORY:  Past Medical History:  Diagnosis Date  . Anxiety   . Chronic back pain   . Diabetes mellitus   . Gastritis   . GERD (gastroesophageal reflux  disease)   . Hypertension   . Renal insufficiency     SURGICAL HISTORY: Past Surgical History:  Procedure Laterality Date  . BACK SURGERY    . COLONOSCOPY WITH PROPOFOL N/A 02/01/2013   SLF: 1. 23 colon polyps removed. 2 retrieved. 2. Mild diverticulosis in teh sigmoid colon 3. Small internal hemorrhoids 4. The colon is redundant   . ESOPHAGOGASTRODUODENOSCOPY (EGD) WITH PROPOFOL N/A 02/01/2013   SLF: 1. Moderate erosive gastritis  . POLYPECTOMY N/A 02/01/2013   Procedure: POLYPECTOMY;  Surgeon: Danie Binder, MD;  Location: [facility];  Service: Endoscopy;  Laterality: N/A;    SOCIAL HISTORY: Social History   Social History  . Marital status: Divorced    Spouse name: N/A  . Number of children: N/A  . Years of education: N/A   Occupational History  . retired     Architect   Social History Main Topics  . Smoking status: Former Smoker    Packs/day: 1.00    Years: 20.00    Types: Cigars    Quit date: 08/04/2013  . Smokeless tobacco: Never Used  . 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 Drug use: No  . Sexual activity: Yes    Birth control/ protection: None   Other Topics Concern  . Not on file   Social History Narrative  . No narrative on file    FAMILY HISTORY: Family History  Problem Relation  Age of Onset  . Colon cancer Neg Hx     ALLERGIES:  is allergic to hydrocodone.  MEDICATIONS:  Scheduled Meds: . DULoxetine  120 mg Oral Daily  . feeding supplement (ENSURE ENLIVE)  237 mL Oral BID BM  . HYDROmorphone   Intravenous Q4H  . insulin aspart  0-9 Units Subcutaneous TID WC  . metoprolol succinate  25 mg Oral Daily  . pantoprazole  40 mg Oral Daily   Continuous Infusions:  PRN Meds:.acetaminophen, cyclobenzaprine, diphenhydrAMINE **OR** diphenhydrAMINE, gi cocktail, hydrALAZINE, HYDROmorphone (DILAUDID) injection, LORazepam, naloxone **AND** sodium chloride flush, ondansetron **OR** ondansetron (ZOFRAN) IV,  ondansetron (ZOFRAN) IV  REVIEW OF SYSTEMS:    10 Point review of Systems was done is negative except as noted above.   LABORATORY DATA:  I have reviewed the data as listed  . CBC Latest Ref Rng & Units 05/01/2016 04/30/2016 04/29/2016  WBC 4.0 - 10.5 K/uL 15.3(H) 12.3(H) 13.9(H)  Hemoglobin 13.0 - 17.0 g/dL 11.4(L) 11.2(L) 11.3(L)  Hematocrit 39.0 - 52.0 % 36.3(L) 33.8(L) 35.3(L)  Platelets 150 - 400 K/uL 383 435(H) 509(H)    . CMP Latest Ref Rng & Units 04/30/2016 04/29/2016 04/29/2016  Glucose 65 - 99 mg/dL 125(H) - 127(H)  BUN 6 - 20 mg/dL 26(H) - 26(H)  Creatinine 0.61 - 1.24 mg/dL 1.23 - 1.37(H)  Sodium 135 - 145 mmol/L 140 - 140  Potassium 3.5 - 5.1 mmol/L 4.3 4.6 5.7(H)  Chloride 101 - 111 mmol/L 102 - 104  CO2 22 - 32 mmol/L 32 - 31  Calcium 8.9 - 10.3 mg/dL 9.1 - 9.2  Total Protein 6.5 - 8.1 g/dL - - -  Total Bilirubin 0.3 - 1.2 mg/dL - - -  Alkaline Phos 38 - 126 U/L - - -  AST 15 - 41 U/L - - -  ALT 17 - 63 U/L - - -     RADIOGRAPHIC STUDIES: I have personally reviewed the radiological images as listed and agreed with the findings in the report. Ct Abdomen Pelvis Wo Contrast  Result Date: 04/23/2016 CLINICAL DATA:  LEFT posterior chest wall mass. Pain and swelling for 1 week EXAM: CT CHEST, ABDOMEN AND PELVIS WITHOUT CONTRAST TECHNIQUE: Multidetector CT imaging of the chest, abdomen and pelvis was performed following the standard protocol without IV contrast. COMPARISON:  CT 02/06/2011, 12/15/2014 FINDINGS: CT CHEST FINDINGS Cardiovascular: Coronary artery calcification and aortic atherosclerotic calcification. Mediastinum/Nodes: No axillary supraclavicular adenopathy. No mediastinal hilar adenopathy. No pericardial fluid. Esophagus Lungs/Pleura: No scattered subpleural pulmonary nodularity. These are numerous and appear benign but less than 5 mm. Musculoskeletal: Beneath the tip of the LEFT shoulder blade, there is thickening of the adjacent musculature to 4.0 x  5.0 cm (image 42, series 2). This soft tissue thickening slightly more dense than adjacent muscle stool tissue density. Lesion measures approximately 6 cm in craniocaudad dimension (image 130, series 5). No IV contrast administered. CT ABDOMEN PELVIS FINDINGS Hepatobiliary: No focal hepatic lesions noncontrast exam. Normal gallbladder. Pancreas: Normal pancreas Spleen: Spleen is enlarged measuring 21 cm in craniocaudad dimension which compares to 20 cm on CT of 12/15/2014. Adrenals/Urinary Tract: Adrenal glands are normal. LEFT kidney most inferior by the enlarged spleen. No hydronephrosis. Bladder normal. Stomach/Bowel: Stomach, small bowel appendix cecum normal. Moderate volume stool in colon. Rectum normal Vascular/Lymphatic: Abdominal aorta is normal caliber with atherosclerotic calcification. There is no retroperitoneal or periportal lymphadenopathy. No pelvic lymphadenopathy. Reproductive: Prostate normal. Other: Large calcified injection granuloma in the LEFT buttock region measures 4 cm x 2.6  cm. Musculoskeletal: Mottled sclerotic pattern to the bones not changed from prior. IMPRESSION: Chest Impression: 1. Oblong soft tissue mass within the musculature inferior to the LEFT scapula likely represents a intramuscular hematoma. Neoplasm would be less likely but not completely excluded. 2. Coronary artery calcification and aortic atherosclerotic calcification. Abdomen / Pelvis Impression: 1. Marked splenomegaly. 2. Mottled sclerotic pattern within the bones unchanged. These results will be called to the ordering clinician or representative by the Radiologist Assistant, and communication documented in the PACS or zVision Dashboard. Electronically Signed   By: Genevive Bi M.D.   On: 04/23/2016 15:09   Dg Chest 1 View  Result Date: 04/27/2016 CLINICAL DATA:  Hypoxia.  Hypertension. EXAM: CHEST 1 VIEW COMPARISON:  April 15, 2016 chest radiograph and chest CT April 23, 2016 FINDINGS: There is no edema  or consolidation. The heart size and pulmonary vascularity are normal. No adenopathy. There is atherosclerotic calcification aorta. No bone lesions. IMPRESSION: Aortic atherosclerosis.  No edema or consolidation. Electronically Signed   By: Bretta Bang III M.D.   On: 04/27/2016 07:13   Dg Chest 2 View  Result Date: 04/15/2016 CLINICAL DATA:  Mid and left-sided chest pain began at 3 a.m. today. Unable to raise the left arm due to pain. EXAM: CHEST  2 VIEW COMPARISON:  Portable chest x-ray dated March 31, 2016. FINDINGS: The lungs are mildly hypoinflated but clear. There is no pneumothorax or pleural effusion. The heart and pulmonary vascularity are normal. The mediastinum is normal in width. There is calcification in the wall of the thoracic aorta. The bony thorax exhibits no acute abnormality. IMPRESSION: Mild hypoinflation.  No acute cardiopulmonary abnormality. Aortic atherosclerosis. Electronically Signed   By: David  [place] M.D.   On: 04/15/2016 10:09   Ct Chest Wo Contrast  Result Date: 04/23/2016 CLINICAL DATA:  Left posterior chest wall mass and swelling for 1 week, dyspnea EXAM: CT CHEST WITHOUT CONTRAST TECHNIQUE: Multidetector CT imaging of the chest was performed following the standard protocol without IV contrast. COMPARISON:  04/15/2016 MRI of the left shoulder unfortunately excluded the area of interest. FINDINGS: Cardiovascular: Top normal size cardiac chambers with coronary arteriosclerosis. No pericardial effusion. Atherosclerosis of the aortic arch and descending aorta without aneurysm. Mediastinum/Nodes: No enlarged mediastinal or axillary lymph nodes. Thyroid gland, trachea, and esophagus demonstrate no significant findings. There is slight dilatation of the distally esophagus possibly from aerophagia. Small hiatal hernia Lungs/Pleura: 2 mm nodular density adjacent to the right major fissure within the anterior right lower lobe, series 3, image 87/88. Pleural-based areas of  ill-defined opacity suggestive of atelectasis and/or scarring in the left lower lobe, the largest approximately 5 mm, series 3, image 80. Mild lower lobe bronchiectasis. Tiny tree-in-bud densities along the periphery of the left lower lobe. No pneumothorax. Upper Abdomen: Splenomegaly. The spleen measures 15.3 cm AP by RO Normal adrenal glands. The unenhanced liver is unremarkable as is the pancreas for focal mass. No ductal dilatation. The gallbladder is free of stones. Normal bowel rotation. Musculoskeletal: Deep to the left trapezius muscle is a heterogeneous crescentic soft tissue abnormality suspicious for a hematoma given Hounsfield unit calculations between 50 and 60 and given the patient's clinical history of pain and swelling over the past week. This measures up to 3 cm in thickness by 10 cm in width by 8.3 cm craniocaudad. The etiology is uncertain. Patchy bony demineralization of the dorsal spine with degenerative disc disease at T11-12 and L5-S1. No acute fracture is apparent. IMPRESSION: Heterogeneous crescentic soft tissue  abnormality deep to the left trapezius muscle with Hounsfield units suggestive of hematoma with internal heterogeneity suggesting areas of probable clot. This measures approximately 10 x 3 x 8.3 cm. Follow up may prove useful to ensure stability and/or resolution as this may mask other underlying pathology. Patchy bony demineralization of the axial skeleton. Splenomegaly. Electronically Signed   By: Ashley Royalty M.D.   On: 04/23/2016 15:01   Mr Chest Wo Contrast  Result Date: 04/27/2016 CLINICAL DATA:  Evaluate left chest wall mass. EXAM: MRI CHEST WITHOUT CONTRAST TECHNIQUE: Multiplanar, multisequence MR imaging was performed. No intravenous contrast was administered. COMPARISON:  Chest CT 04/23/2016 FINDINGS: The chest wall mass identified on the chest CT demonstrates increased T1 signal intensity most consistent with a hematoma. The actual hematoma measures a maximum of 8 x 3  cm. This likely arising in the serratus anterior muscle but there is also some hematoma outside the muscle. There is significant surrounding fluid and edema which is involving the latissimus dorsi muscle also. No scapular fracture or rib fracture. The visualized left lung is grossly clear.  No rib lesions. IMPRESSION: Large left-sided chest wall hematoma with surrounding fluid, edema and inflammation. No underlying scapular or left rib fracture. Electronically Signed   By: Marijo Sanes M.D.   On: 04/27/2016 13:03   Mr Shoulder Left Wo Contrast  Result Date: 04/16/2016 CLINICAL DATA:  Left shoulder pain without a injury. EXAM: MRI OF THE LEFT SHOULDER WITHOUT CONTRAST TECHNIQUE: Multiplanar, multisequence MR imaging of the shoulder was performed. No intravenous contrast was administered. COMPARISON:  None. FINDINGS: Rotator cuff: Mild tendinosis of the supraspinatus tendon with a small insertional interstitial tear. Infraspinatus tendon is intact. Teres minor tendon is intact. Subscapularis tendon is intact. Muscles: No atrophy or fatty replacement of nor abnormal signal within, the muscles of the rotator cuff. Biceps long head:  Intact. Acromioclavicular Joint: Mild arthropathy of the acromioclavicular joint. Type I acromion. Trace amount of subacromial/ subdeltoid bursal fluid. Glenohumeral Joint: No joint effusion.  Mild chondromalacia. Labrum: Limited evaluation secondary lack of intra-articular contrast. Small posterior labral tear at the labroligamentous junction. Bones: Mild subcortical reactive marrow changes in the lesser tuberosity. No other marrow signal abnormality. No fracture or dislocation. Other: No fluid collection or hematoma. IMPRESSION: 1. Mild tendinosis of the supraspinatus tendon with a small insertional interstitial tear. 2. Mild subacromial/subdeltoid bursitis. Electronically Signed   By: Kathreen Devoid   On: 04/16/2016 08:08   Ct Biopsy  Result Date: 05/01/2016 INDICATION:  Leukocytosis, thrombocytopenia, concern for myeloproliferative process. EXAM: CT GUIDED LEFT ILIAC BONE MARROW ASPIRATION AND CORE BIOPSY Date:  10/12/201710/06/2016 10:06 am Radiologist:  M. Daryll Brod, MD Guidance:  CT FLUOROSCOPY TIME:  Fluoroscopy Time: None. MEDICATIONS: None. ANESTHESIA/SEDATION: 3.0 mg IV Versed; 50 mcg IV Fentanyl Moderate Sedation Time:  16 minutes The patient was continuously monitored during the procedure by the interventional radiology nurse under my direct supervision. CONTRAST:  None. COMPLICATIONS: None PROCEDURE: Informed consent was obtained from the patient following explanation of the procedure, risks, benefits and alternatives. The patient understands, agrees and consents for the procedure. All questions were addressed. A time out was performed. The patient was positioned prone and non-contrast localization CT was performed of the pelvis to demonstrate the iliac marrow spaces. Maximal barrier sterile technique utilized including caps, mask, sterile gowns, sterile gloves, large sterile drape, hand hygiene, and Betadine prep. Under sterile conditions and local anesthesia, an 11 gauge coaxial bone biopsy needle was advanced into the left iliac marrow space. Needle  position was confirmed with CT imaging. Initially, bone marrow aspiration was performed. Next, the 11 gauge outer cannula was utilized to obtain a left iliac bone marrow core biopsy. Needle was removed. Hemostasis was obtained with compression. The patient tolerated the procedure well. Samples were prepared with the cytotechnologist. No immediate complications. IMPRESSION: CT guided left iliac bone marrow aspiration and core biopsy. Electronically Signed   By: Jerilynn Mages.  Shick M.D.   On: 05/01/2016 10:19   Ct Biopsy  Result Date: 04/25/2016 INDICATION: 70 year old male with splenomegaly and thrombocytosis and clinical concern for myeloproliferative neoplasm. EXAM: CT GUIDED BONE MARROW ASPIRATION AND CORE BIOPSY  Interventional Radiologist:  Criselda Peaches, MD MEDICATIONS: None. ANESTHESIA/SEDATION: Moderate (conscious) sedation was employed during this procedure. A total of 4 milligrams versed and 200 micrograms fentanyl were administered intravenously. The patient's level of consciousness and vital signs were monitored continuously by radiology nursing throughout the procedure under my direct supervision. Total monitored sedation time: 17 minutes FLUOROSCOPY TIME:  Fluoroscopy Time: 0 minutes 0 seconds (0 mGy). COMPLICATIONS: None immediate. Estimated blood loss: <25 mL PROCEDURE: Informed written consent was obtained from the patient after a thorough discussion of the procedural risks, benefits and alternatives. All questions were addressed. Maximal Sterile Barrier Technique was utilized including caps, mask, sterile gowns, sterile gloves, sterile drape, hand hygiene and skin antiseptic. A timeout was performed prior to the initiation of the procedure. The patient was positioned prone and non-contrast localization CT was performed of the pelvis to demonstrate the iliac marrow spaces. Maximal barrier sterile technique utilized including caps, mask, sterile gowns, sterile gloves, large sterile drape, hand hygiene, and betadine prep. Under sterile conditions and local anesthesia, an 11 gauge coaxial bone biopsy needle was advanced into the right iliac marrow space. Needle position was confirmed with CT imaging. Initially, bone marrow aspiration was performed. Next, the 11 gauge outer cannula was utilized to obtain a right iliac bone marrow core biopsy. Needle was removed. Hemostasis was obtained with compression. The patient tolerated the procedure well. Samples were prepared with the cytotechnologist. IMPRESSION: Technically successful CT-guided core biopsy of the right iliac bone. Electronically Signed   By: Jacqulynn Cadet M.D.   On: 04/25/2016 12:47   Dg Shoulder Left  Result Date: 04/15/2016 CLINICAL DATA:   Onset of left shoulder pain this morning without known injury EXAM: LEFT SHOULDER - 2+ VIEW COMPARISON:  None in PACs FINDINGS: The bones are subjectively adequately mineralized. The AP view is suboptimally positioned. No acute fracture is observed. The glenohumeral joint space is reasonably well-maintained. The subacromial subdeltoid space is normal. A small spur arises from the inferior tip of the left clavicle. IMPRESSION: No acute bony abnormality of the left shoulder. Small spur arises from the inferior articular margin of the right clavicle and may be impacting the superior aspect of the rotator cuff. Electronically Signed   By: David  Martinique M.D.   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 BCL-ABL in peripheral bood was negative, JAk2 V617F mutation neg. LDH significantly elevated. CT guided BM Bx 04/25/2016  - non diagnostic due to poor sample collection. CT guided BM Bx rpt 05/01/2016 --pending results. Prelim- discussed with Dr Tresa Moore - no significant cellular elements noted. Cbc stable, thrombocytosis resolved spontaneously and leucocytosis has was also down some. Plan -rpt CT guided  Bone marrow biopsy final results pending -- wont be available till early next week-- can be followed up in clinic. -peripheral blood MPN panel with hypereosinophilia FISH panel ordered under miscellaneous labs -rpt LDH and LDH iso-enzymes.   2) Abnormal PTT (patient not on blood thinners) ?factor inhibitor . Outside PTT WNL and PTT mixing study normal -fviii and fix level wnl --makes fviii inhibitor -vwd panel wnl -fibrinogen- appropriately elevated. -lupus anticoagulant not detected. VWD panel wnl Plan -no overt evidence of coagulopathy  3) Left chest wall hematoma -hgb stable.  Clinically improving hematoma. ? Related to HTN vs platelet dysfunction from cymbalta vs coagulopathy. -MRI chest -consistent with hematoma no evidence of underlying tumor. Plan -monitor hgb - has remained stable. -pain mx per hospitalist - would transition to Po pain medications -consider ?spint /Kinesiology tape/partial chest strapping to limit movement of affected lattisimus dorsi muscle for pain control. -lidoderm patch over area of maximal chest wall pain -monitor and optimize htn control - not optimally controlled at this time ( an element could be from pain) -no blood thinners/NSAIDS -discuss possibly lowering Cymbalta dose/alternative medication (dose of Cymbalta was apparenty recently increased from '60mg'$  to '120mg'$  in the last month or so my PCP)  4) chest pain - recent positive stress test. Patient has f/u with Dr Branch/Cardiology to consider possible cardiac catheterization.  5) CKD  From my perspective patient could be discharged once he is transitioned to po pain medication and final bone marrow biopsy can be followed up as outpatient.  F/u with Dr Audia Amick/Dr Whitney Muse at [facility] in 1week post discharge  I spent 30 minutes counseling the patient face to face. The total time spent in the appointment was 35 minutes and more than 50% was on counseling and direct patient cares.    Sullivan Lone MD [place] AAHIVMS [facility] Hematology/Oncology Physician [facility]  (Office):       [phone] (Work cell):  [phone] (Fax):           [phone]

## 2016-05-03 NOTE — Progress Notes (Signed)
PROGRESS NOTE    Alan NABOR  FFM:[id number] DOB: [birth year] DOA: 04/25/2016 PCP: [name], MD    Brief Narrative:  [name]  is a 70 y.o. male, With history of hypertension, diabetes mellitus who came to the short stay for bone marrow biopsy, and was found to have worsening of left-sided chest pain. Patient had a CT scan done on 04/23/2016 which showed heterogenous crescentic soft tissue abnormality deep to the left trapezius muscle suggestive of hematoma. 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 He also had moderate of the shoulder on 04/16/2016 which showed mild tendinosis of the supraspinatus tendon with a small insertional interstitial tear.  Patient has noticed worsening of pain and swelling in the left posterior chest wall. Today lab work showed PTT of 43. He denies nausea vomiting or diarrhea. No shortness of breath. Denies any coughing. No fever but has been having episodes of profuse sweating intermittently  Patient was admitted for pain management and currently on Dilaudid PCA pump. Bone marrow biopsy done today.  Hematology/ oncology following.  10/14 his dilaudid PCA was discontinued and he was put on IV dilaudid and added lidoderm patch over the hematoma.     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 Per patient increasing size and hematoma however H&H stable. MRI consistent with hematoma.. Hematology/oncology following. Patient with significant pain  and IV pain regimen was changed to reduce Dilaudid PCA pump. pt continues to report persistent pain, added IV diluadid for breakthrough pain . He also appears to be very anxious and ativan was added. Pt reported that ativan sedated him, changed to decreased dose of xanax. 10/14 his dilaudid PCA was discontinued and he was put on IV dilaudid and added lidoderm patch over the hematoma.    Monitor closely for respiratory depression.   #2 acute respiratory distress with hypoxia Likely secondary to respiratory depression from narcotic pain medication. Resolved. Patient currently back to baseline. Monitor closely. On RA with good oxygen sats.   #3 chest pain Patient with some complaints of chest pain with both typical and atypical features. Currently chest pain-free. EKG with no significant ischemic changes noted. Chest x-ray unremarkable. Cardiac enzymes negative 3.  Continue PPI. GI cocktail when necessary.  No chest pain today. He is scheduled for cardiac catheterization in the near future with cardiology.   #4 leukocytosis and thrombocytosis Concern for myeloproliferative neoplasm/disorder. Patient status post bone marrow biopsy with aspirate predominantly with peripheral blood and poor quality per hematology oncology. Repeat bone marrow biopsy done 10/12. Will follow the results.  Hematology/oncology following and appreciate input and recommendations. His platelet count is normal at 383 and his wbc count still elevated at 15.3. Continue to monitor. Wallace labs ordered by oncology..     #5 elevated PTT/PT Mixing study done.  Per hematology/ oncology.  #6  Acute KIDNEY INJURY Resolved.  Stable.  #7 hypertension Sup optimal. Added hydralazine prn.  Continue lisinopril and metoprolol.  #8 diabetes mellitus CBGs have ranged from   100 to 200's. . Sliding scale  insulin. CBG (last 3)   Recent Labs  05/02/16 1826 05/02/16 2106 05/03/16 0800  GLUCAP 142* 121* 164*    resme SSI. cbgs well  controlled.    DVT prophylaxis: SCDs Code Status: Full Family Communication: Updated patient. No family at bedside. Disposition Plan: home in the next 1to 2 days once pain is well controlled.    Consultants:   Oncology: Dr. Irene Limbo 04/25/2016  Procedures:   CT biopsy done as outpatient 04/25/2016 per Dr. Geroge Baseman  MRI chest 04/26/2016  Antimicrobials:   None   Subjective: His back pain is easing off.  Wants to know the results of the second bone marrow biopsy.   Objective: Vitals:   05/03/16 0748 05/03/16 0931 05/03/16 1312 05/03/16 1354  BP:  (!) 165/75  (!) 167/91  Pulse:  70  (!) 57  Resp: '11 12 13 15  '$ Temp:  98.2 F (36.8 C)  98.3 F (36.8 C)  TempSrc:  Oral  Oral  SpO2: 97% 96% 97% 98%  Weight:      Height:        Intake/Output Summary (Last 24 hours) at 05/03/16 1610 Last data filed at 05/03/16 1354  Gross per 24 hour  Intake              840 ml  Output                0 ml  Net              840 ml   Filed Weights   04/25/16 1618  Weight: 89.4 kg (197 lb)    Examination:  General exam: Alert. Speaking in full sentences. Anxious./  Respiratory system: Clear to auscultation anterior lung fields. Respiratory effort normal. Speaking in full sentences. Cardiovascular system: S1 & S2 heard, RRR. No JVD, murmurs, rubs, gallops or clicks. No pedal edema. Gastrointestinal system: Abdomen is nondistended, soft and nontender. No organomegaly or masses felt. Normal bowel sounds heard. Central nervous system: Alert and oriented. No focal neurological deficits. Extremities: Symmetric 5 x 5 power. Skin: Left upper back/scapular region with tender soft tissue swelling. No rashes, lesions or ulcers     Data Reviewed: I have personally reviewed following labs and imaging studies  CBC:  Recent Labs Lab 04/27/16 0413 04/28/16 0345 04/29/16 0341 04/30/16 0405 05/01/16 1119  WBC 13.4* 13.4* 13.9* 12.3* 15.3*  NEUTROABS 10.6* 9.8*  --   --  11.3*  HGB  11.8* 11.3* 11.3* 11.2* 11.4*  HCT 35.3* 34.6* 35.3* 33.8* 36.3*  MCV 88.7 90.3 91.2 88.5 91.7  PLT 715* 546* 509* 435* 893   Basic Metabolic Panel:  Recent Labs Lab 04/27/16 0413 04/28/16 0345 04/29/16 0341 04/29/16 0832 04/30/16 0405  NA 138 142 140  --  140  K 4.5 4.9 5.7* 4.6 4.3  CL 107 107 104  --  102  CO2 '25 29 31  '$ --  32  GLUCOSE 197* 105* 127*  --  125*  BUN 21* 27* 26*  --  26*  CREATININE 1.49* 1.65* 1.37*  --  1.23  CALCIUM 8.7* 9.0 9.2  --  9.1   GFR: Estimated Creatinine Clearance: 61.3 mL/min (by C-G formula based on SCr of 1.23 mg/dL). Liver Function Tests: No results for input(s): AST, ALT, ALKPHOS, BILITOT, PROT, ALBUMIN in the last 168 hours. No results for input(s): LIPASE, AMYLASE in the last 168 hours. No results for input(s): AMMONIA in the last 168 hours. Coagulation Profile: No results for input(s): INR, PROTIME in the  last 168 hours. Cardiac Enzymes:  Recent Labs Lab 04/27/16 0908 04/27/16 1515 04/27/16 2054  TROPONINI <0.03 <0.03 <0.03   BNP (last 3 results) No results for input(s): PROBNP in the last 8760 hours. HbA1C: No results for input(s): HGBA1C in the last 72 hours. CBG:  Recent Labs Lab 05/02/16 0855 05/02/16 1237 05/02/16 1826 05/02/16 2106 05/03/16 0800  GLUCAP 205* 100* 142* 121* 164*   Lipid Profile: No results for input(s): CHOL, HDL, LDLCALC, TRIG, CHOLHDL, LDLDIRECT in the last 72 hours. Thyroid Function Tests: No results for input(s): TSH, T4TOTAL, FREET4, T3FREE, THYROIDAB in the last 72 hours. Anemia Panel: No results for input(s): VITAMINB12, FOLATE, FERRITIN, TIBC, IRON, RETICCTPCT in the last 72 hours. Sepsis Labs: No results for input(s): PROCALCITON, LATICACIDVEN in the last 168 hours.  Recent Results (from the past 240 hour(s))  Culture, Urine     Status: None   Collection Time: 04/26/16 11:05 AM  Result Value Ref Range Status   Specimen Description URINE, RANDOM  Final   Special Requests NONE   Final   Culture NO GROWTH Performed at [facility]   Final   Report Status 04/27/2016 FINAL  Final         Radiology Studies: No results found.      Scheduled Meds: . [START ON 05/04/2016] DULoxetine  90 mg Oral Daily  . feeding supplement (ENSURE ENLIVE)  237 mL Oral BID BM  . insulin aspart  0-9 Units Subcutaneous TID WC  . lidocaine  1 patch Transdermal Q24H  . metoprolol succinate  25 mg Oral Daily  . pantoprazole  40 mg Oral Daily   Continuous Infusions:     LOS: 7 days    Time spent: 25 minutes    Trinia Georgi, MD [facility] Pager [phone]  If 7PM-7AM, please contact night-coverage [email or web address] Password [email or web address] 05/03/2016, 4:10 PM

## 2016-05-04 LAB — GLUCOSE, CAPILLARY
GLUCOSE-CAPILLARY: 134 mg/dL — AB (ref 65–99)
GLUCOSE-CAPILLARY: 110 mg/dL — AB (ref 65–99)

## 2016-05-04 MED ORDER — ENSURE ENLIVE PO LIQD: 237.0000 mL | Freq: Two times a day (BID) | ORAL | 12 refills | Status: DC

## 2016-05-04 MED ORDER — LIDOCAINE 5 % EX PTCH: 1.0000 | MEDICATED_PATCH | CUTANEOUS | 0 refills | Status: DC

## 2016-05-04 MED ORDER — HYDROMORPHONE HCL 2 MG PO TABS
2.0000 mg | ORAL_TABLET | ORAL | Status: DC | PRN
  Administered 2016-05-04: 2 mg via ORAL
  Filled 2016-05-04: qty 1

## 2016-05-04 MED ORDER — DULOXETINE HCL 30 MG PO CPEP: 90.0000 mg | ORAL_CAPSULE | Freq: Every day | ORAL | 0 refills | Status: DC

## 2016-05-04 MED ORDER — HYDROMORPHONE HCL 2 MG PO TABS: 2.0000 mg | ORAL_TABLET | ORAL | 0 refills | Status: DC | PRN

## 2016-05-04 MED ORDER — ALPRAZOLAM 0.5 MG PO TABS: 0.5000 mg | ORAL_TABLET | Freq: Two times a day (BID) | ORAL | 0 refills | Status: DC | PRN

## 2016-05-04 NOTE — Progress Notes (Signed)
HEMATOLOGY/ONCOLOGY CONSULTATION NOTE  Date of Service: 04/23/2016 Patient Care Team: [name], MD as PCP - General (Internal Medicine) Danie Binder, MD as Consulting Physician (Gastroenterology)  CHIEF COMPLAINTS/PURPOSE OF CONSULTATION:  "Immature cells in the blood"  HISTORY OF PRESENTING ILLNESS:   [name] is a  70 y.o. 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  Patient has a history of hypertension, diabetes, dyslipidemia, chronic kidney disease, chronic back pain issues, BPH, GERD , thrombocytopenia since at least March 2015 his platelet counts were in the 40-50.k range, splenomegaly documented in 2010 who was referred to Dr. Jacquiline Doe from hematology in March 2013 for thrombocytopenia and immature cells in the blood. Patient reports that he never made the hematology appointment.  He reports that he recently was in the emergency room for chest pain that has been occurring persistently on and off for the last several weeks. He reports that he had a cardiac stress test recently which he was told was positive. He notes that his blood pressure was as high as 200/90 and he has recently been started on a beta blocker. Continues to be on baby aspirin daily. He notes that he will be seeing Dr. Harl Bowie from cardiology for further evaluation tomorrow.   Patient reports that he had routine labs done for his routine physical evaluation on 03/12/2016 which showed a hemoglobin of 13.8k, WBC count of 10.2k with 4.8k neutrophils 1.9k lymphocytes and some increased immature granulocytes and an absolute basophil count of 600. No blasts noted on outside manual differential. His platelet counts on these labs appear to be normal now at 231k. He notes no fevers no chills no night sweats no unexpected weight loss. Notes he has had some dull dragging abdominal pain in his upper abdomen. He reports  no significant alcohol use. No focal symptoms of infection. No known liver disease Smokes one to 2 cigars a day.  Notes that he been having intermittent pain due to his kidney stones that have been quite bothered. No overt hematuria. No dysuria. No flank pain or tenderness currently.  Patient and his wife notes that they have been under a lot of stress due to the fact that 2 of their brother-in-law's past in the last 2 months from heart disease and you're having some issues with her children.  INTERVAL HISTORY  Patient is here for his scheduled followup. He was admitted to the [facility] for chest wall pain and left shoulder pain. Has developed a pain lump over the left posterior chest wall which appears to be have come on overnight and without significant trauma.    MEDICAL HISTORY:  Past Medical History:  Diagnosis Date  . Anxiety   . Chronic back pain   . Diabetes mellitus   . Gastritis   . GERD (gastroesophageal reflux disease)   . Hypertension   . Renal insufficiency     SURGICAL HISTORY: Past Surgical History:  Procedure Laterality Date  . BACK SURGERY    . COLONOSCOPY WITH PROPOFOL N/A 02/01/2013   SLF: 1. 23 colon polyps removed. 2 retrieved. 2. Mild diverticulosis in teh sigmoid colon 3. Small internal hemorrhoids 4. The colon is redundant   . ESOPHAGOGASTRODUODENOSCOPY (EGD) WITH PROPOFOL N/A 02/01/2013   SLF: 1. Moderate erosive gastritis  . POLYPECTOMY N/A 02/01/2013   Procedure: POLYPECTOMY;  Surgeon: Danie Binder, MD;  Location: [facility];  Service: Endoscopy;  Laterality: N/A;    SOCIAL HISTORY: Social History   Social History  . Marital status: Divorced    Spouse name: N/A  . Number of children: N/A  . Years of education: N/A   Occupational History  . retired     Architect   Social History Main Topics  . Smoking status: Former Smoker    Packs/day: 1.00    Years: 20.00    Types: Cigars    Quit date: 08/04/2013  . Smokeless tobacco: Never Used  . Alcohol  use No     Comment: Remote history of ETOH abuse, "when I was young and dumb"   . Drug use: No  . Sexual activity: Yes    Birth control/ protection: None   Other Topics Concern  . Not on file   Social History Narrative  . No narrative on file    FAMILY HISTORY: Family History  Problem Relation Age of Onset  . Colon cancer Neg Hx     ALLERGIES:  is allergic to hydrocodone.  MEDICATIONS:  Current Outpatient Prescriptions  Medication Sig Dispense Refill  . acetaminophen (TYLENOL) 500 MG tablet Take 500 mg by mouth every 6 (six) hours as needed for mild pain or moderate pain.    Marland Kitchen ALPRAZolam (XANAX) 0.5 MG tablet Take 1 tablet (0.5 mg total) by mouth 2 (two) times daily as needed for anxiety. 10 tablet 0  . cyclobenzaprine (FLEXERIL) 5 MG tablet Take 1 tablet (5 mg total) by mouth 3 (three) times daily as needed for muscle spasms. 20 tablet 0  . [START ON 05/05/2016] DULoxetine (CYMBALTA) 30 MG capsule Take 3 capsules (90 mg total) by mouth daily. 30 capsule 0  . feeding supplement, ENSURE ENLIVE, (ENSURE ENLIVE) LIQD Take 237 mLs by mouth 2 (two) times daily between meals. 237 mL 12  . HYDROmorphone (DILAUDID) 2 MG tablet Take 1 tablet (2 mg total) by mouth every 4 (four) hours as needed for moderate pain or severe pain. 20 tablet 0  . lidocaine (LIDODERM) 5 % Place 1 patch onto the skin daily. Remove & Discard patch within 12 hours or as directed by MD 10 patch 0  . lisinopril (PRINIVIL,ZESTRIL) 20 MG tablet Take 20 mg by mouth daily.    . metFORMIN (GLUCOPHAGE) 500 MG tablet Take 500 mg by mouth daily.    . metoprolol succinate (TOPROL-XL) 25 MG 24 hr tablet Take 25 mg by mouth daily.    Marland Kitchen omeprazole (PRILOSEC) 20 MG capsule Take 20 mg by mouth 2 (two) times daily.     Marland Kitchen oxyCODONE-acetaminophen (PERCOCET/ROXICET) 5-325 MG tablet Take 1-2 tablets by mouth every 4 (four) hours as needed for severe pain. 40 tablet 0   No current facility-administered medications for this visit.      REVIEW OF SYSTEMS:    10 Point review of Systems was done is negative except as noted above.  PHYSICAL EXAMINATION: ECOG PERFORMANCE STATUS: 1 - Symptomatic but completely ambulatory  . Vitals:   04/23/16 1031  BP: 133/70  Pulse: 72  Resp: 18  Temp: 98.1 F (36.7 C)   There were no vitals filed for this visit. .There is no height or weight on file to calculate BMI.  GENERAL:alert, in no acute distress and comfortable SKIN: skin color, texture, turgor are normal, no rashes or significant lesions EYES: normal, conjunctiva are pink and non-injected, sclera clear OROPHARYNX:no exudate, no erythema and lips, buccal mucosa, and tongue normal  NECK: supple, no JVD, thyroid normal size, non-tender, without nodularity  LYMPH:  no palpable lymphadenopathy in the cervical, axillary or inguinal LUNGS: clear to auscultation with normal respiratory effort HEART: regular rate & rhythm,  no murmurs and no lower extremity edema ABDOMEN: abdomen soft, non-tender, normoactive bowel sounds , No palpable hepatomegaly, palpable splenomegaly for 4-5 cm under the costal margin in the midclavicular line Musculoskeletal: no cyanosis of digits and no clubbing  PSYCH: alert & oriented x 3 with fluent speech NEURO: no focal motor/sensory deficits  LABORATORY DATA:  I have reviewed the data as listed  Component     Latest Ref Rng & Units 04/23/2016  WBC     4.0 - 10.5 K/uL 17.4 (H)  RBC     4.22 - 5.81 MIL/uL 4.20 (L)  Hemoglobin     13.0 - 17.0 g/dL 12.2 (L)  HCT     39.0 - 52.0 % 38.1 (L)  MCV     78.0 - 100.0 fL 90.7  MCH     26.0 - 34.0 pg 29.0  MCHC     30.0 - 36.0 g/dL 32.0  RDW     11.5 - 15.5 % 22.6 (H)  Platelets     150 - 400 K/uL 648 (H)  Neutrophils     % 16  Lymphocytes     % 9  Monocytes Relative     % 3  Eosinophil     % 1  Basophil     % 2  Band Neutrophils     % 19  Metamyelocytes Relative     % 14  Myelocytes     % 17  Promyelocytes Absolute     % 10   Blasts     % 9  nRBC     0 /100 WBC 0  RBC Morphology      POLYCHROMASIA PRESENT  WBC Morphology      MARKED LEFT SHIFT (>5% METAS,MYELOS AND PROS, OCC BLAST NOTED)  Smear Review      PLATELETS APPEAR INCREASED  Sodium     135 - 145 mmol/L 136  Potassium     3.5 - 5.1 mmol/L 4.7  Chloride     101 - 111 mmol/L 103  CO2     22 - 32 mmol/L 27  Glucose     65 - 99 mg/dL 111 (H)  BUN     6 - 20 mg/dL 16  Creatinine     0.61 - 1.24 mg/dL 1.61 (H)  Calcium     8.9 - 10.3 mg/dL 8.8 (L)  Total Protein     6.5 - 8.1 g/dL 6.7  Albumin     3.5 - 5.0 g/dL 3.5  AST     15 - 41 U/L 38  ALT     17 - 63 U/L 25  Alkaline Phosphatase     38 - 126 U/L 133 (H)  Total Bilirubin     0.3 - 1.2 mg/dL 0.7  EGFR (Non-African Amer.)     >60 mL/min 42 (L)  EGFR (African American)     >60 mL/min 48 (L)  Anion gap     5 - 15 6  LDH     98 - 192 U/L 1,058 (H)  Uric Acid, Serum     4.4 - 7.6 mg/dL 7.0   Component     Latest Ref Rng & Units 04/09/2016  IgG (Immunoglobin G), Serum     700 - 1,600 mg/dL 602 (L)  IgA     61 - 437 mg/dL  139  IgM, Serum     20 - 172 mg/dL 123  Total Protein ELP     6.0 - 8.5 g/dL 6.0  Albumin SerPl Elph-Mcnc     2.9 - 4.4 g/dL 3.3  Alpha 1     0.0 - 0.4 g/dL 0.3  Alpha2 Glob SerPl Elph-Mcnc     0.4 - 1.0 g/dL 0.8  B-Globulin SerPl Elph-Mcnc     0.7 - 1.3 g/dL 1.0  Gamma Glob SerPl Elph-Mcnc     0.4 - 1.8 g/dL 0.6  M Protein SerPl Elph-Mcnc     Not Observed g/dL Not Observed  Globulin, Total     2.2 - 3.9 g/dL 2.7  Albumin/Glob SerPl     0.7 - 1.7 1.3  IFE 1      Comment  Please Note (HCV):      Comment  Kappa free light chain     3.3 - 19.4 mg/L 29.7 (H)  Lamda free light chains     5.7 - 26.3 mg/L 24.5  Kappa, lamda light chain ratio     0.26 - 1.65 1.21  JAK2 GenotypR      Comment  Director Review, JAK2      Comment  Background:      Comment  LDH     98 - 192 U/L 1,121 (H)  Sed Rate     0 - 16 mm/hr 38 (H)      RADIOGRAPHIC STUDIES: I have personally reviewed the radiological images as listed and agreed with the findings in the report. Ct Abdomen Pelvis Wo Contrast  Result Date: 04/23/2016 CLINICAL DATA:  LEFT posterior chest wall mass. Pain and swelling for 1 week EXAM: CT CHEST, ABDOMEN AND PELVIS WITHOUT CONTRAST TECHNIQUE: Multidetector CT imaging of the chest, abdomen and pelvis was performed following the standard protocol without IV contrast. COMPARISON:  CT 02/06/2011, 12/15/2014 FINDINGS: CT CHEST FINDINGS Cardiovascular: Coronary artery calcification and aortic atherosclerotic calcification. Mediastinum/Nodes: No axillary supraclavicular adenopathy. No mediastinal hilar adenopathy. No pericardial fluid. Esophagus Lungs/Pleura: No scattered subpleural pulmonary nodularity. These are numerous and appear benign but less than 5 mm. Musculoskeletal: Beneath the tip of the LEFT shoulder blade, there is thickening of the adjacent musculature to 4.0 x 5.0 cm (image 42, series 2). This soft tissue thickening slightly more dense than adjacent muscle stool tissue density. Lesion measures approximately 6 cm in craniocaudad dimension (image 130, series 5). No IV contrast administered. CT ABDOMEN PELVIS FINDINGS Hepatobiliary: No focal hepatic lesions noncontrast exam. Normal gallbladder. Pancreas: Normal pancreas Spleen: Spleen is enlarged measuring 21 cm in craniocaudad dimension which compares to 20 cm on CT of 12/15/2014. Adrenals/Urinary Tract: Adrenal glands are normal. LEFT kidney most inferior by the enlarged spleen. No hydronephrosis. Bladder normal. Stomach/Bowel: Stomach, small bowel appendix cecum normal. Moderate volume stool in colon. Rectum normal Vascular/Lymphatic: Abdominal aorta is normal caliber with atherosclerotic calcification. There is no retroperitoneal or periportal lymphadenopathy. No pelvic lymphadenopathy. Reproductive: Prostate normal. Other: Large calcified injection granuloma in the  LEFT buttock region measures 4 cm x 2.6 cm. Musculoskeletal: Mottled sclerotic pattern to the bones not changed from prior. IMPRESSION: Chest Impression: 1. Oblong soft tissue mass within the musculature inferior to the LEFT scapula likely represents a intramuscular hematoma. Neoplasm would be less likely but not completely excluded. 2. Coronary artery calcification and aortic atherosclerotic calcification. Abdomen / Pelvis Impression: 1. Marked splenomegaly. 2. Mottled sclerotic pattern within the bones unchanged. These results will be called to the ordering clinician or representative by  the Radiologist Assistant, and communication documented in the PACS or zVision Dashboard. Electronically Signed   By: Suzy Bouchard M.D.   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 JAK2 [id number] neg , BCR ABL pending. Peripheral blood flowcytometry - unrevealing for clonal lymphoproliferative process. #2 history of thrombocytopenia platelet counts were in the 40-50k in 2015. Recently now with thrombocytosis. #3 history of splenomegaly since 2010 - unclear etiology. Noted to have teardrops in his peripheral blood rule out mild proliferative syndrome especially myelofibrosis. #4 Left chest wall new mass -- CT chest /abd/pelvis was done -- shows large hematoma. No other LNadenopathy. Massive splenomegaly. Plan - stat CT chest/abd/pelvis was done today - noted above. -given option to go to [facility] vs awaiting Bone marrow biopsy results. -BCR-ABL resent ( no processed appropriately previously) -CT guided BM Bx setup for Friday -recommended avoiding straining back muscles and seeking immediately ED help with worsening left chest wall hematoma.  Return to clinic with Dr Whitney Muse  in 3-4 work days after [facility] Bx for followup.  All of the patients questions were answered with apparent satisfaction. The patient knows to call the clinic with any problems, questions or concerns.  I spent 35 minutes counseling the patient face to face. The total time spent in the appointment was 45 minutes and more than 50% was on counseling and direct patient cares.    Sullivan Lone MD [place] AAHIVMS [facility] Hematology/Oncology Physician [facility]  (Office):       [phone] (Work cell):  [phone] (Fax):           [phone]

## 2016-05-04 NOTE — Discharge Summary (Addendum)
Physician Discharge Summary  [name] RFF:[id number] DOB: 1945-07-31 DOA: 04/25/2016  PCP: [name], MD  Admit date: 04/25/2016 Discharge date: 05/04/2016  Admitted From: Home Disposition:  HOme   Recommendations for Outpatient Follow-up:  1. Follow up with PCP in 1-2 weeks 2. Please obtain BMP/CBC in one week    Discharge Condition:stable.  CODE STATUS: full code.  Diet recommendation: Heart Healthy  Brief/Interim Summary: [name] a 70 y.o.male,With history of hypertension, diabetes mellitus who came to the short stay for bone marrow biopsy, and was found to have worsening of left-sided chest pain. Patient had a CT scan done on 04/23/2016 which showed heterogenous crescentic soft tissue abnormality deep to the left trapezius muscle suggestive of hematoma.Patient has been seen by hematology/oncology for immature myeloid precursors in blood with basophilia, leukocytosis, thrombocytopenia and splenomegaly. He also had moderate of the shoulder on 04/16/2016 which showed mild tendinosis of the supraspinatus tendon with a small insertional interstitial tear.  Patient has noticed worsening of pain and swelling in the left posterior chest wall.nies any coughing. No fever but has been having episodes of profuse sweating intermittently  Patient was admitted for pain management and was started on dilaudid PCA. He was later on transitioned to oral dilaudid and discharged home.   Discharge Diagnoses:  Principal Problem:   Hematoma Active Problems:   Diabetes mellitus (HCC)   Chest pain   CKD (chronic kidney disease) stage 3, GFR 30-59 ml/min   Mass of chest wall, left   Abnormal partial thromboplastin time (PTT)   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HCC)   Acute respiratory failure with hypoxia (HCC)   Hypoxia   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HCC)  Severe protein energy malnutrition   1 left posterior chest wall/upper back hematoma Questionable etiology. Per patient  increasing size and hematoma however H&H stable. MRI consistent with hematoma.. Hematology/oncology following. Patient was with significant pain and IV pain regimen was changed to reduce Dilaudid PCA pump.  Marland Kitchen He also appears to be very anxious and xanax ordered.. 10/14 his dilaudid PCA was discontinued and he was put on  dilaudid and added lidoderm patch over the hematoma.     Discharged home on po dilaudid for better pain control.   #2 acute respiratory distress with hypoxia Likely secondary to respiratory depression from narcotic pain medication. Resolved. Patient currently back to baseline. Monitor closely. On RA with good oxygen sats.   #3 chest pain Patient with some complaints of chest pain with both typical and atypical features. Currently chest pain-free. EKG with no significant ischemic changes noted. Chest x-ray unremarkable. Cardiac enzymes negative 3.  Continue PPI.   No chest pain today. He is scheduled for cardiac catheterization in the near future with cardiology.   #4 leukocytosis and thrombocytosis Concern for myeloproliferative neoplasm/disorder. Patient status post bone marrow biopsy with aspirate predominantly with peripheral blood and poor quality per hematology oncology. Repeat bone marrow biopsy done 10/12.  Hematology/oncology following and appreciate input and recommendations. His platelet count is normal at 383 and his wbc count still elevated at 15.3. Continue to monitor. Amanda labs ordered by oncology..  Pt wanted to go home , recommended to follow up the results of bone marrow with oncology.     #5 elevated PTT/PT Mixing study done.  Per hematology/ oncology.  #6  Acute KIDNEY INJURY Resolved.  Stable.  #7 hypertension  Continue lisinopril and metoprolol.  #8 diabetes mellitus CBGs have ranged from   100 to 200's. . Sliding scale insulin. CBG (last 3)   Recent  Labs (last 2 labs)    Recent Labs  05/02/16 1826 05/02/16 2106 05/03/16 0800   GLUCAP 142* 121* 164*     cbgs well controlled.   #9. Severe Protein Energy Malnutrition: nutrition supplemented and recommendations given.   Discharge Instructions  Discharge Instructions    Diet general    Complete by:  As directed    Discharge instructions    Complete by:  As directed    Follow up with Dr Candise Che regarding the bone marrow biopsy results.       Medication List    STOP taking these medications   traMADol 50 MG tablet Commonly known as:  ULTRAM     TAKE these medications   acetaminophen 500 MG tablet Commonly known as:  TYLENOL Take 500 mg by mouth every 6 (six) hours as needed for mild pain or moderate pain.   ALPRAZolam 0.5 MG tablet Commonly known as:  XANAX Take 1 tablet (0.5 mg total) by mouth 2 (two) times daily as needed for anxiety.   cyclobenzaprine 5 MG tablet Commonly known as:  FLEXERIL Take 1 tablet (5 mg total) by mouth 3 (three) times daily as needed for muscle spasms.   DULoxetine 30 MG capsule Commonly known as:  CYMBALTA Take 3 capsules (90 mg total) by mouth daily. Start taking on:  05/05/2016 What changed:  medication strength  how much to take   feeding supplement (ENSURE ENLIVE) Liqd Take 237 mLs by mouth 2 (two) times daily between meals.   HYDROmorphone 2 MG tablet Commonly known as:  DILAUDID Take 1 tablet (2 mg total) by mouth every 4 (four) hours as needed for moderate pain or severe pain.   lidocaine 5 % Commonly known as:  LIDODERM Place 1 patch onto the skin daily. Remove & Discard patch within 12 hours or as directed by MD   lisinopril 20 MG tablet Commonly known as:  PRINIVIL,ZESTRIL Take 20 mg by mouth daily.   metFORMIN 500 MG tablet Commonly known as:  GLUCOPHAGE Take 500 mg by mouth daily.   metoprolol succinate 25 MG 24 hr tablet Commonly known as:  TOPROL-XL Take 25 mg by mouth daily.   omeprazole 20 MG capsule Commonly known as:  PRILOSEC Take 20 mg by mouth 2 (two) times daily.    oxyCODONE-acetaminophen 5-325 MG tablet Commonly known as:  PERCOCET/ROXICET Take 1-2 tablets by mouth every 4 (four) hours as needed for severe pain.      Follow-up Information    SHAH,ASHISH, MD. Schedule an appointment as soon as possible for a visit in 1 week(s).   Specialty:  Internal Medicine Contact information: [address] [place] [address] [phone]          Allergies  Allergen Reactions  . Hydrocodone Itching    Consultations: Oncology  IR  Procedures/Studies: Ct Abdomen Pelvis Wo Contrast  Result Date: 04/23/2016 CLINICAL DATA:  LEFT posterior chest wall mass. Pain and swelling for 1 week EXAM: CT CHEST, ABDOMEN AND PELVIS WITHOUT CONTRAST TECHNIQUE: Multidetector CT imaging of the chest, abdomen and pelvis was performed following the standard protocol without IV contrast. COMPARISON:  CT 02/06/2011, 12/15/2014 FINDINGS: CT CHEST FINDINGS Cardiovascular: Coronary artery calcification and aortic atherosclerotic calcification. Mediastinum/Nodes: No axillary supraclavicular adenopathy. No mediastinal hilar adenopathy. No pericardial fluid. Esophagus Lungs/Pleura: No scattered subpleural pulmonary nodularity. These are numerous and appear benign but less than 5 mm. Musculoskeletal: Beneath the tip of the LEFT shoulder blade, there is thickening of the adjacent musculature to 4.0 x 5.0  cm (image 42, series 2). This soft tissue thickening slightly more dense than adjacent muscle stool tissue density. Lesion measures approximately 6 cm in craniocaudad dimension (image 130, series 5). No IV contrast administered. CT ABDOMEN PELVIS FINDINGS Hepatobiliary: No focal hepatic lesions noncontrast exam. Normal gallbladder. Pancreas: Normal pancreas Spleen: Spleen is enlarged measuring 21 cm in craniocaudad dimension which compares to 20 cm on CT of 12/15/2014. Adrenals/Urinary Tract: Adrenal glands are normal. LEFT kidney most inferior by the enlarged spleen. No hydronephrosis.  Bladder normal. Stomach/Bowel: Stomach, small bowel appendix cecum normal. Moderate volume stool in colon. Rectum normal Vascular/Lymphatic: Abdominal aorta is normal caliber with atherosclerotic calcification. There is no retroperitoneal or periportal lymphadenopathy. No pelvic lymphadenopathy. Reproductive: Prostate normal. Other: Large calcified injection granuloma in the LEFT buttock region measures 4 cm x 2.6 cm. Musculoskeletal: Mottled sclerotic pattern to the bones not changed from prior. IMPRESSION: Chest Impression: 1. Oblong soft tissue mass within the musculature inferior to the LEFT scapula likely represents a intramuscular hematoma. Neoplasm would be less likely but not completely excluded. 2. Coronary artery calcification and aortic atherosclerotic calcification. Abdomen / Pelvis Impression: 1. Marked splenomegaly. 2. Mottled sclerotic pattern within the bones unchanged. These results will be called to the ordering clinician or representative by the Radiologist Assistant, and communication documented in the PACS or zVision Dashboard. Electronically Signed   By: Suzy Bouchard M.D.   On: 04/23/2016 15:09   Dg Chest 1 View  Result Date: 04/27/2016 CLINICAL DATA:  Hypoxia.  Hypertension. EXAM: CHEST 1 VIEW COMPARISON:  April 15, 2016 chest radiograph and chest CT April 23, 2016 FINDINGS: There is no edema or consolidation. The heart size and pulmonary vascularity are normal. No adenopathy. There is atherosclerotic calcification aorta. No bone lesions. IMPRESSION: Aortic atherosclerosis.  No edema or consolidation. Electronically Signed   By: Lowella Grip III M.D.   On: 04/27/2016 07:13   Dg Chest 2 View  Result Date: 04/15/2016 CLINICAL DATA:  Mid and left-sided chest pain began at 3 a.m. today. Unable to raise the left arm due to pain. EXAM: CHEST  2 VIEW COMPARISON:  Portable chest x-ray dated March 31, 2016. FINDINGS: The lungs are mildly hypoinflated but clear. There is no  pneumothorax or pleural effusion. The heart and pulmonary vascularity are normal. The mediastinum is normal in width. There is calcification in the wall of the thoracic aorta. The bony thorax exhibits no acute abnormality. IMPRESSION: Mild hypoinflation.  No acute cardiopulmonary abnormality. Aortic atherosclerosis. Electronically Signed   By: David  Martinique M.D.   On: 04/15/2016 10:09   Ct Chest Wo Contrast  Result Date: 04/23/2016 CLINICAL DATA:  Left posterior chest wall mass and swelling for 1 week, dyspnea EXAM: CT CHEST WITHOUT CONTRAST TECHNIQUE: Multidetector CT imaging of the chest was performed following the standard protocol without IV contrast. COMPARISON:  04/15/2016 MRI of the left shoulder unfortunately excluded the area of interest. FINDINGS: Cardiovascular: Top normal size cardiac chambers with coronary arteriosclerosis. No pericardial effusion. Atherosclerosis of the aortic arch and descending aorta without aneurysm. Mediastinum/Nodes: No enlarged mediastinal or axillary lymph nodes. Thyroid gland, trachea, and esophagus demonstrate no significant findings. There is slight dilatation of the distally esophagus possibly from aerophagia. Small hiatal hernia Lungs/Pleura: 2 mm nodular density adjacent to the right major fissure within the anterior right lower lobe, series 3, image 87/88. Pleural-based areas of ill-defined opacity suggestive of atelectasis and/or scarring in the left lower lobe, the largest approximately 5 mm, series 3, image 80. Mild lower lobe  bronchiectasis. Tiny tree-in-bud densities along the periphery of the left lower lobe. No pneumothorax. Upper Abdomen: Splenomegaly. The spleen measures 15.3 cm AP by RO Normal adrenal glands. The unenhanced liver is unremarkable as is the pancreas for focal mass. No ductal dilatation. The gallbladder is free of stones. Normal bowel rotation. Musculoskeletal: Deep to the left trapezius muscle is a heterogeneous crescentic soft tissue  abnormality suspicious for a hematoma given Hounsfield unit calculations between 50 and 60 and given the patient's clinical history of pain and swelling over the past week. This measures up to 3 cm in thickness by 10 cm in width by 8.3 cm craniocaudad. The etiology is uncertain. Patchy bony demineralization of the dorsal spine with degenerative disc disease at T11-12 and L5-S1. No acute fracture is apparent. IMPRESSION: Heterogeneous crescentic soft tissue abnormality deep to the left trapezius muscle with Hounsfield units suggestive of hematoma with internal heterogeneity suggesting areas of probable clot. This measures approximately 10 x 3 x 8.3 cm. Follow up may prove useful to ensure stability and/or resolution as this may mask other underlying pathology. Patchy bony demineralization of the axial skeleton. Splenomegaly. Electronically Signed   By: Ashley Royalty M.D.   On: 04/23/2016 15:01   Mr Chest Wo Contrast  Result Date: 04/27/2016 CLINICAL DATA:  Evaluate left chest wall mass. EXAM: MRI CHEST WITHOUT CONTRAST TECHNIQUE: Multiplanar, multisequence MR imaging was performed. No intravenous contrast was administered. COMPARISON:  Chest CT 04/23/2016 FINDINGS: The chest wall mass identified on the chest CT demonstrates increased T1 signal intensity most consistent with a hematoma. The actual hematoma measures a maximum of 8 x 3 cm. This likely arising in the serratus anterior muscle but there is also some hematoma outside the muscle. There is significant surrounding fluid and edema which is involving the latissimus dorsi muscle also. No scapular fracture or rib fracture. The visualized left lung is grossly clear.  No rib lesions. IMPRESSION: Large left-sided chest wall hematoma with surrounding fluid, edema and inflammation. No underlying scapular or left rib fracture. Electronically Signed   By: Marijo Sanes M.D.   On: 04/27/2016 13:03   Mr Shoulder Left Wo Contrast  Result Date: 04/16/2016 CLINICAL  DATA:  Left shoulder pain without a injury. EXAM: MRI OF THE LEFT SHOULDER WITHOUT CONTRAST TECHNIQUE: Multiplanar, multisequence MR imaging of the shoulder was performed. No intravenous contrast was administered. COMPARISON:  None. FINDINGS: Rotator cuff: Mild tendinosis of the supraspinatus tendon with a small insertional interstitial tear. Infraspinatus tendon is intact. Teres minor tendon is intact. Subscapularis tendon is intact. Muscles: No atrophy or fatty replacement of nor abnormal signal within, the muscles of the rotator cuff. Biceps long head:  Intact. Acromioclavicular Joint: Mild arthropathy of the acromioclavicular joint. Type I acromion. Trace amount of subacromial/ subdeltoid bursal fluid. Glenohumeral Joint: No joint effusion.  Mild chondromalacia. Labrum: Limited evaluation secondary lack of intra-articular contrast. Small posterior labral tear at the labroligamentous junction. Bones: Mild subcortical reactive marrow changes in the lesser tuberosity. No other marrow signal abnormality. No fracture or dislocation. Other: No fluid collection or hematoma. IMPRESSION: 1. Mild tendinosis of the supraspinatus tendon with a small insertional interstitial tear. 2. Mild subacromial/subdeltoid bursitis. Electronically Signed   By: Kathreen Devoid   On: 04/16/2016 08:08   Ct Biopsy  Result Date: 05/01/2016 INDICATION: Leukocytosis, thrombocytopenia, concern for myeloproliferative process. EXAM: CT GUIDED LEFT ILIAC BONE MARROW ASPIRATION AND CORE BIOPSY Date:  10/12/201710/06/2016 10:06 am Radiologist:  M. Daryll Brod, MD Guidance:  CT FLUOROSCOPY TIME:  Fluoroscopy  Time: None. MEDICATIONS: None. ANESTHESIA/SEDATION: 3.0 mg IV Versed; 50 mcg IV Fentanyl Moderate Sedation Time:  16 minutes The patient was continuously monitored during the procedure by the interventional radiology nurse under my direct supervision. CONTRAST:  None. COMPLICATIONS: None PROCEDURE: Informed consent was obtained from the  patient following explanation of the procedure, risks, benefits and alternatives. The patient understands, agrees and consents for the procedure. All questions were addressed. A time out was performed. The patient was positioned prone and non-contrast localization CT was performed of the pelvis to demonstrate the iliac marrow spaces. Maximal barrier sterile technique utilized including caps, mask, sterile gowns, sterile gloves, large sterile drape, hand hygiene, and Betadine prep. Under sterile conditions and local anesthesia, an 11 gauge coaxial bone biopsy needle was advanced into the left iliac marrow space. Needle position was confirmed with CT imaging. Initially, bone marrow aspiration was performed. Next, the 11 gauge outer cannula was utilized to obtain a left iliac bone marrow core biopsy. Needle was removed. Hemostasis was obtained with compression. The patient tolerated the procedure well. Samples were prepared with the cytotechnologist. No immediate complications. IMPRESSION: CT guided left iliac bone marrow aspiration and core biopsy. Electronically Signed   By: Jerilynn Mages.  Shick M.D.   On: 05/01/2016 10:19   Ct Biopsy  Result Date: 04/25/2016 INDICATION: 70 year old male with splenomegaly and thrombocytosis and clinical concern for myeloproliferative neoplasm. EXAM: CT GUIDED BONE MARROW ASPIRATION AND CORE BIOPSY Interventional Radiologist:  Criselda Peaches, MD MEDICATIONS: None. ANESTHESIA/SEDATION: Moderate (conscious) sedation was employed during this procedure. A total of 4 milligrams versed and 200 micrograms fentanyl were administered intravenously. The patient's level of consciousness and vital signs were monitored continuously by radiology nursing throughout the procedure under my direct supervision. Total monitored sedation time: 17 minutes FLUOROSCOPY TIME:  Fluoroscopy Time: 0 minutes 0 seconds (0 mGy). COMPLICATIONS: None immediate. Estimated blood loss: <25 mL PROCEDURE: Informed written  consent was obtained from the patient after a thorough discussion of the procedural risks, benefits and alternatives. All questions were addressed. Maximal Sterile Barrier Technique was utilized including caps, mask, sterile gowns, sterile gloves, sterile drape, hand hygiene and skin antiseptic. A timeout was performed prior to the initiation of the procedure. The patient was positioned prone and non-contrast localization CT was performed of the pelvis to demonstrate the iliac marrow spaces. Maximal barrier sterile technique utilized including caps, mask, sterile gowns, sterile gloves, large sterile drape, hand hygiene, and betadine prep. Under sterile conditions and local anesthesia, an 11 gauge coaxial bone biopsy needle was advanced into the right iliac marrow space. Needle position was confirmed with CT imaging. Initially, bone marrow aspiration was performed. Next, the 11 gauge outer cannula was utilized to obtain a right iliac bone marrow core biopsy. Needle was removed. Hemostasis was obtained with compression. The patient tolerated the procedure well. Samples were prepared with the cytotechnologist. IMPRESSION: Technically successful CT-guided core biopsy of the right iliac bone. Electronically Signed   By: Jacqulynn Cadet M.D.   On: 04/25/2016 12:47   Dg Shoulder Left  Result Date: 04/15/2016 CLINICAL DATA:  Onset of left shoulder pain this morning without known injury EXAM: LEFT SHOULDER - 2+ VIEW COMPARISON:  None in PACs FINDINGS: The bones are subjectively adequately mineralized. The AP view is suboptimally positioned. No acute fracture is observed. The glenohumeral joint space is reasonably well-maintained. The subacromial subdeltoid space is normal. A small spur arises from the inferior tip of the left clavicle. IMPRESSION: No acute bony abnormality of the left shoulder.  Small spur arises from the inferior articular margin of the right clavicle and may be impacting the superior aspect of the  rotator cuff. Electronically Signed   By: David  Martinique M.D.   On: 04/15/2016 12:03       Subjective: No new complaints, wants to go home.   Discharge Exam: Vitals:   05/04/16 0700 05/04/16 0957  BP: 101/79 137/60  Pulse: 73 66  Resp: 12 12  Temp: 98.4 F (36.9 C) 98.5 F (36.9 C)   Vitals:   05/03/16 1800 05/03/16 2045 05/04/16 0700 05/04/16 0957  BP: (!) 159/79 (!) 158/73 101/79 137/60  Pulse: 62 66 73 66  Resp: '15 13 12 12  '$ Temp: 98.4 F (36.9 C) 98.1 F (36.7 C) 98.4 F (36.9 C) 98.5 F (36.9 C)  TempSrc: Oral Oral Oral Oral  SpO2: 95% 90% 94% 95%  Weight:      Height:        General: Pt is alert, awake, not in acute distress Cardiovascular: RRR, S1/S2 +, no rubs, no gallops Respiratory: CTA bilaterally, no wheezing, no rhonchi Abdominal: Soft, NT, ND, bowel sounds + Extremities: no edema, no cyanosis    The results of significant diagnostics from this hospitalization (including imaging, microbiology, ancillary and laboratory) are listed below for reference.     Microbiology: Recent Results (from the past 240 hour(s))  Culture, Urine     Status: None   Collection Time: 04/26/16 11:05 AM  Result Value Ref Range Status   Specimen Description URINE, RANDOM  Final   Special Requests NONE  Final   Culture NO GROWTH Performed at [facility]   Final   Report Status 04/27/2016 FINAL  Final     Labs: BNP (last 3 results) No results for input(s): BNP in the last 8760 hours. Basic Metabolic Panel:  Recent Labs Lab 04/28/16 0345 04/29/16 0341 04/29/16 0832 04/30/16 0405  NA 142 140  --  140  K 4.9 5.7* 4.6 4.3  CL 107 104  --  102  CO2 29 31  --  32  GLUCOSE 105* 127*  --  125*  BUN 27* 26*  --  26*  CREATININE 1.65* 1.37*  --  1.23  CALCIUM 9.0 9.2  --  9.1   Liver Function Tests: No results for input(s): AST, ALT, ALKPHOS, BILITOT, PROT, ALBUMIN in the last 168 hours. No results for input(s): LIPASE, AMYLASE in the last 168 hours. No  results for input(s): AMMONIA in the last 168 hours. CBC:  Recent Labs Lab 04/28/16 0345 04/29/16 0341 04/30/16 0405 05/01/16 1119  WBC 13.4* 13.9* 12.3* 15.3*  NEUTROABS 9.8*  --   --  11.3*  HGB 11.3* 11.3* 11.2* 11.4*  HCT 34.6* 35.3* 33.8* 36.3*  MCV 90.3 91.2 88.5 91.7  PLT 546* 509* 435* 383   Cardiac Enzymes:  Recent Labs Lab 04/27/16 2054  TROPONINI <0.03   BNP: Invalid input(s): POCBNP CBG:  Recent Labs Lab 05/03/16 1201 05/03/16 1656 05/03/16 2219 05/04/16 0733 05/04/16 1142  GLUCAP 112* 119* 130* 134* 110*   D-Dimer No results for input(s): DDIMER in the last 72 hours. Hgb A1c No results for input(s): HGBA1C in the last 72 hours. Lipid Profile No results for input(s): CHOL, HDL, LDLCALC, TRIG, CHOLHDL, LDLDIRECT in the last 72 hours. Thyroid function studies No results for input(s): TSH, T4TOTAL, T3FREE, THYROIDAB in the last 72 hours.  Invalid input(s): FREET3 Anemia work up No results for input(s): VITAMINB12, FOLATE, FERRITIN, TIBC, IRON, RETICCTPCT in the  last 72 hours. Urinalysis    Component Value Date/Time   COLORURINE YELLOW 04/26/2016 1105   APPEARANCEUR CLEAR 04/26/2016 1105   LABSPEC 1.011 04/26/2016 1105   PHURINE 6.0 04/26/2016 1105   GLUCOSEU NEGATIVE 04/26/2016 1105   HGBUR NEGATIVE 04/26/2016 1105   BILIRUBINUR NEGATIVE 04/26/2016 1105   KETONESUR NEGATIVE 04/26/2016 1105   PROTEINUR 100 (A) 04/26/2016 1105   UROBILINOGEN 0.2 03/26/2008 1920   NITRITE NEGATIVE 04/26/2016 1105   LEUKOCYTESUR NEGATIVE 04/26/2016 1105   Sepsis Labs Invalid input(s): PROCALCITONIN,  WBC,  LACTICIDVEN Microbiology Recent Results (from the past 240 hour(s))  Culture, Urine     Status: None   Collection Time: 04/26/16 11:05 AM  Result Value Ref Range Status   Specimen Description URINE, RANDOM  Final   Special Requests NONE  Final   Culture NO GROWTH Performed at [facility]   Final   Report Status 04/27/2016 FINAL  Final      Time coordinating discharge: Over 30 minutes  SIGNED:   Hosie Poisson, MD  [facility] 05/04/2016, 8:53 PM Pager   If 7PM-7AM, please contact night-coverage [email or web address] Password [email or web address]

## 2016-05-04 NOTE — Progress Notes (Signed)
Patient discharged to home with family, discharge instructions reviewed with patient and daughter who verbalized understanding. New RX's given to patient.

## 2016-05-06 LAB — LACTATE DEHYDROGENASE, ISOENZYMES
LDH 1: 15 % — ABNORMAL LOW (ref 17–32)
LDH 2: 43 % — AB (ref 25–40)
LDH 3: 30 % — AB (ref 17–27)
LDH 4: 10 % (ref 5–13)
LDH 5: 2 % — AB (ref 4–20)
LDH ISOENZYMES, TOTAL: 886 IU/L — AB (ref 121–224)

## 2016-05-08 LAB — CHROMOSOME ANALYSIS, BONE MARROW

## 2016-05-10 LAB — MISC LABCORP TEST (SEND OUT): Labcorp test code: 511444

## 2016-05-12 ENCOUNTER — Encounter: Payer: Self-pay | Admitting: Cardiology

## 2016-05-12 ENCOUNTER — Ambulatory Visit (INDEPENDENT_AMBULATORY_CARE_PROVIDER_SITE_OTHER): Payer: Medicare Other | Admitting: Cardiology

## 2016-05-12 VITALS — BP 175/77 | HR 75 | Ht 72.0 in | Wt 200.0 lb

## 2016-05-12 DIAGNOSIS — R0789 Other chest pain: Secondary | ICD-10-CM | POA: Diagnosis not present

## 2016-05-12 DIAGNOSIS — R9439 Abnormal result of other cardiovascular function study: Secondary | ICD-10-CM

## 2016-05-12 DIAGNOSIS — N183 Chronic kidney disease, stage 3 unspecified: Secondary | ICD-10-CM

## 2016-05-12 DIAGNOSIS — I2510 Atherosclerotic heart disease of native coronary artery without angina pectoris: Secondary | ICD-10-CM | POA: Diagnosis not present

## 2016-05-12 NOTE — Progress Notes (Signed)
Clinical Summary [name] is a 70 y.o.male seen today for follow up of the following medical problems.   1. Abnormal stress test - recent chest pain 3-4 week ago. Pressure like feeling midchest, 7/10 in severity. Isolated episode while sitting. +SOB. Had some diaphoresis. Lasted about 45 minutes. Not positional. Martin Majestic to [facility], refused admissions - since that time mild "twinges" I nchest. No SOB or DOE, though fairly sedentary lifestyle.  - nuclear stress test at [facility] showed large area of inferolateral ischemia.  - CAD risk factors: HTN, DM2, +tobacco cigars several years. Father CVA late 20s. Mother with CAD late 81s.   - admit 03/2016 with atypical chest pain, thought to be shoulder issue based on MRI. Admitted a few weeks later with left chest wall hematoma, unclear etiology.  - constant left sided pain over the last several days, mainly in area of his hematoma.  - he stopped taking Toprol XL on his own for unclear reasons.    2. CKD - upcoming appt with nephrology Dr Hinda Lenis.    3. Thrombocytopenia - . Underoing evalaution by heme for possible myeloproliferative disorder.  - has f/u with oncology this week. S/p bone marrow biopsy, awaiting results due later this week.    4. Hematoma - admit 04/2016 with left chest wall hematoma. MRI showed large left sided chest wall heamtoma with surrounding edema and inflammation. - unclear cause   Past Medical History:  Diagnosis Date  . Anxiety   . Chronic back pain   . Diabetes mellitus   . Gastritis   . GERD (gastroesophageal reflux disease)   . Hypertension   . Renal insufficiency      Allergies  Allergen Reactions  . Hydrocodone Itching     Current Outpatient Prescriptions  Medication Sig Dispense Refill  . acetaminophen (TYLENOL) 500 MG tablet Take 500 mg by mouth every 6 (six) hours as needed for mild pain or moderate pain.    Marland Kitchen ALPRAZolam (XANAX) 0.5 MG tablet Take 1 tablet (0.5 mg total) by mouth  2 (two) times daily as needed for anxiety. 10 tablet 0  . cyclobenzaprine (FLEXERIL) 5 MG tablet Take 1 tablet (5 mg total) by mouth 3 (three) times daily as needed for muscle spasms. 20 tablet 0  . DULoxetine (CYMBALTA) 30 MG capsule Take 3 capsules (90 mg total) by mouth daily. 30 capsule 0  . feeding supplement, ENSURE ENLIVE, (ENSURE ENLIVE) LIQD Take 237 mLs by mouth 2 (two) times daily between meals. 237 mL 12  . HYDROmorphone (DILAUDID) 2 MG tablet Take 1 tablet (2 mg total) by mouth every 4 (four) hours as needed for moderate pain or severe pain. 20 tablet 0  . lidocaine (LIDODERM) 5 % Place 1 patch onto the skin daily. Remove & Discard patch within 12 hours or as directed by MD 10 patch 0  . lisinopril (PRINIVIL,ZESTRIL) 20 MG tablet Take 20 mg by mouth daily.    . metFORMIN (GLUCOPHAGE) 500 MG tablet Take 500 mg by mouth daily.    . metoprolol succinate (TOPROL-XL) 25 MG 24 hr tablet Take 25 mg by mouth daily.    Marland Kitchen omeprazole (PRILOSEC) 20 MG capsule Take 20 mg by mouth 2 (two) times daily.     Marland Kitchen oxyCODONE-acetaminophen (PERCOCET/ROXICET) 5-325 MG tablet Take 1-2 tablets by mouth every 4 (four) hours as needed for severe pain. 40 tablet 0   No current facility-administered medications for this visit.      Past Surgical History:  Procedure Laterality  Date  . BACK SURGERY    . COLONOSCOPY WITH PROPOFOL N/A 02/01/2013   SLF: 1. 23 colon polyps removed. 2 retrieved. 2. Mild diverticulosis in teh sigmoid colon 3. Small internal hemorrhoids 4. The colon is redundant   . ESOPHAGOGASTRODUODENOSCOPY (EGD) WITH PROPOFOL N/A 02/01/2013   SLF: 1. Moderate erosive gastritis  . POLYPECTOMY N/A 02/01/2013   Procedure: POLYPECTOMY;  Surgeon: Danie Binder, MD;  Location: [facility];  Service: Endoscopy;  Laterality: N/A;     Allergies  Allergen Reactions  . Hydrocodone Itching      Family History  Problem Relation Age of Onset  . Colon cancer Neg Hx      Social History [name]  reports that he quit smoking about 2 years ago. His smoking use included Cigars. He has a 20.00 pack-year smoking history. He has never used smokeless tobacco. [name] reports that he does not drink alcohol.   Review of Systems CONSTITUTIONAL: No weight loss, fever, chills, weakness or fatigue.  HEENT: Eyes: No visual loss, blurred vision, double vision or yellow sclerae.No hearing loss, sneezing, congestion, runny nose or sore throat.  SKIN: No rash or itching.  CARDIOVASCULAR: per HPI RESPIRATORY: No shortness of breath, cough or sputum.  GASTROINTESTINAL: No anorexia, nausea, vomiting or diarrhea. No abdominal pain or blood.  GENITOURINARY: No burning on urination, no polyuria NEUROLOGICAL: No headache, dizziness, syncope, paralysis, ataxia, numbness or tingling in the extremities. No change in bowel or bladder control.  MUSCULOSKELETAL: No muscle, back pain, joint pain or stiffness.  LYMPHATICS: No enlarged nodes. No history of splenectomy.  PSYCHIATRIC: No history of depression or anxiety.  ENDOCRINOLOGIC: No reports of sweating, cold or heat intolerance. No polyuria or polydipsia.  Marland Kitchen   Physical Examination Vitals:   05/12/16 1527  BP: (!) 175/77  Pulse: 75   Vitals:   05/12/16 1527  Weight: 200 lb (90.7 kg)  Height: 6' (1.829 m)    Gen: resting comfortably, no acute distress HEENT: no scleral icterus, pupils equal round and reactive, no palptable cervical adenopathy,  CV Resp: Clear to auscultation bilaterally GI: abdomen is soft, non-tender, non-distended, normal bowel sounds, no hepatosplenomegaly MSK: extremities are warm, no edema.  Skin: warm, no rash Neuro:  no focal deficits Psych: appropriate affect   Diagnostic Studies     Assessment and Plan  1. Chest pain - chest pain symptoms are fairly atypical however  recent abnormal nuclear stress test, multiple CAD risk factors - cath has been delayed due to ongoing workup for possible myeloproliferative  disorder. He also has recent chest wall hematoma that will need to resolve. His renal function has improved and as much a limiting factor at this time - await heme/onc f/u this week, discuss with Dr Irene Limbo potential cath and potential issues with neccesary DAPT. We had discussed before however appears clinical course and workup has somewhat changed - if did have cath, would favor BMS.   2. CKD III - has a new patient upcoming with Dr Hinda Lenis - renal function improving.          Arnoldo Lenis, M.D.

## 2016-05-12 NOTE — Patient Instructions (Signed)
WE WILL CONTACT YOU AFTER YOUR APPOINTMENT WITH ONCOLOGY   Your physician recommends that you continue on your current medications as directed. Please refer to the Current Medication list given to you today.  Thank you for choosing [facility]!!

## 2016-05-13 ENCOUNTER — Ambulatory Visit (HOSPITAL_COMMUNITY)
Admission: RE | Admit: 2016-05-13 | Discharge: 2016-05-13 | Disposition: A | Discharge status: Home / Self Care | Payer: Medicare Other | Source: Ambulatory Visit | Attending: Nephrology | Admitting: Nephrology

## 2016-05-13 ENCOUNTER — Encounter (HOSPITAL_COMMUNITY): Payer: Self-pay

## 2016-05-13 DIAGNOSIS — N200 Calculus of kidney: Secondary | ICD-10-CM | POA: Diagnosis not present

## 2016-05-13 DIAGNOSIS — N183 Chronic kidney disease, stage 3 unspecified: Secondary | ICD-10-CM

## 2016-05-13 DIAGNOSIS — N189 Chronic kidney disease, unspecified: Secondary | ICD-10-CM | POA: Diagnosis not present

## 2016-05-13 DIAGNOSIS — R161 Splenomegaly, not elsewhere classified: Secondary | ICD-10-CM | POA: Insufficient documentation

## 2016-05-14 ENCOUNTER — Other Ambulatory Visit (HOSPITAL_COMMUNITY): Payer: Self-pay | Admitting: *Deleted

## 2016-05-14 ENCOUNTER — Encounter (HOSPITAL_COMMUNITY): Payer: Self-pay | Admitting: Hematology

## 2016-05-14 ENCOUNTER — Encounter (HOSPITAL_COMMUNITY): Payer: Medicare Other

## 2016-05-14 ENCOUNTER — Encounter (HOSPITAL_BASED_OUTPATIENT_CLINIC_OR_DEPARTMENT_OTHER): Payer: Medicare Other | Admitting: Hematology

## 2016-05-14 VITALS — BP 164/78 | HR 69 | Temp 97.9°F | Resp 18 | Wt 198.5 lb

## 2016-05-14 DIAGNOSIS — D471 Chronic myeloproliferative disease: Secondary | ICD-10-CM

## 2016-05-14 DIAGNOSIS — E1122 Type 2 diabetes mellitus with diabetic chronic kidney disease: Secondary | ICD-10-CM | POA: Diagnosis not present

## 2016-05-14 DIAGNOSIS — Z7982 Long term (current) use of aspirin: Secondary | ICD-10-CM | POA: Diagnosis not present

## 2016-05-14 DIAGNOSIS — R161 Splenomegaly, not elsewhere classified: Secondary | ICD-10-CM

## 2016-05-14 DIAGNOSIS — D473 Essential (hemorrhagic) thrombocythemia: Secondary | ICD-10-CM | POA: Diagnosis not present

## 2016-05-14 DIAGNOSIS — R740 Nonspecific elevation of levels of transaminase and lactic acid dehydrogenase [LDH]: Secondary | ICD-10-CM

## 2016-05-14 DIAGNOSIS — D72829 Elevated white blood cell count, unspecified: Secondary | ICD-10-CM

## 2016-05-14 DIAGNOSIS — R0789 Other chest pain: Secondary | ICD-10-CM | POA: Diagnosis not present

## 2016-05-14 DIAGNOSIS — N189 Chronic kidney disease, unspecified: Secondary | ICD-10-CM | POA: Diagnosis not present

## 2016-05-14 DIAGNOSIS — D72824 Basophilia: Secondary | ICD-10-CM | POA: Diagnosis not present

## 2016-05-14 DIAGNOSIS — K219 Gastro-esophageal reflux disease without esophagitis: Secondary | ICD-10-CM | POA: Diagnosis not present

## 2016-05-14 DIAGNOSIS — I129 Hypertensive chronic kidney disease with stage 1 through stage 4 chronic kidney disease, or unspecified chronic kidney disease: Secondary | ICD-10-CM | POA: Diagnosis not present

## 2016-05-14 DIAGNOSIS — D696 Thrombocytopenia, unspecified: Secondary | ICD-10-CM | POA: Diagnosis not present

## 2016-05-14 DIAGNOSIS — Z87891 Personal history of nicotine dependence: Secondary | ICD-10-CM | POA: Diagnosis not present

## 2016-05-14 DIAGNOSIS — E785 Hyperlipidemia, unspecified: Secondary | ICD-10-CM | POA: Diagnosis not present

## 2016-05-14 DIAGNOSIS — D72828 Other elevated white blood cell count: Secondary | ICD-10-CM

## 2016-05-14 DIAGNOSIS — R222 Localized swelling, mass and lump, trunk: Secondary | ICD-10-CM

## 2016-05-14 DIAGNOSIS — Z7984 Long term (current) use of oral hypoglycemic drugs: Secondary | ICD-10-CM | POA: Diagnosis not present

## 2016-05-14 DIAGNOSIS — F419 Anxiety disorder, unspecified: Secondary | ICD-10-CM | POA: Diagnosis not present

## 2016-05-14 LAB — CBC WITH DIFFERENTIAL/PLATELET
BAND NEUTROPHILS: 0 %
BASOS PCT: 0 %
BLASTS: 0 %
Basophils Absolute: 0 10*3/uL (ref 0.0–0.1)
EOS ABS: 0.2 10*3/uL (ref 0.0–0.7)
Eosinophils Relative: 2 %
HCT: 35 % — ABNORMAL LOW (ref 39.0–52.0)
Hemoglobin: 11.3 g/dL — ABNORMAL LOW (ref 13.0–17.0)
Lymphocytes Relative: 26 %
Lymphs Abs: 2.9 10*3/uL (ref 0.7–4.0)
MCH: 29.6 pg (ref 26.0–34.0)
MCHC: 32.3 g/dL (ref 30.0–36.0)
MCV: 91.6 fL (ref 78.0–100.0)
MONO ABS: 0.3 10*3/uL (ref 0.1–1.0)
MYELOCYTES: 0 %
Metamyelocytes Relative: 0 %
Monocytes Relative: 3 %
NEUTROS PCT: 69 %
Neutro Abs: 7.9 10*3/uL — ABNORMAL HIGH (ref 1.7–7.7)
Other: 0 %
PLATELETS: 372 10*3/uL (ref 150–400)
PROMYELOCYTES ABS: 0 %
RBC: 3.82 MIL/uL — ABNORMAL LOW (ref 4.22–5.81)
RDW: 21.6 % — AB (ref 11.5–15.5)
WBC: 11.3 10*3/uL — ABNORMAL HIGH (ref 4.0–10.5)
nRBC: 0 /100 WBC

## 2016-05-14 LAB — CHROMOSOME ANALYSIS, BONE MARROW

## 2016-05-14 NOTE — Patient Instructions (Addendum)
[place] at [facility]  Discharge Instructions:  2nd opinion at [facility]/Dr. Florene Glen Follow up in 2 months with Dr. Whitney Muse with labs.  _______________________________________________________________  Thank you for choosing [place] at [facility] to provide your oncology and hematology care.  To afford each patient quality time with our providers, please arrive at least 15 minutes before your scheduled appointment.  You need to re-schedule your appointment if you arrive 10 or more minutes late.  We strive to give you quality time with our providers, and arriving late affects you and other patients whose appointments are after yours.  Also, if you no show three or more times for appointments you may be dismissed from the clinic.  Again, thank you for choosing [place] at [facility] hope is that these requests will allow you access to exceptional care and in a timely manner. _______________________________________________________________  If you have questions after your visit, please contact our office at [phone] between the hours of 8:30 a.m. and 5:00 p.m. Voicemails left after 4:30 p.m. will not be returned until the following business day. _______________________________________________________________  For prescription refill requests, have your pharmacy contact our office. _______________________________________________________________  Recommendations made by the consultant and any test results will be sent to your referring physician. _______________________________________________________________

## 2016-05-15 DIAGNOSIS — D471 Chronic myeloproliferative disease: Secondary | ICD-10-CM | POA: Diagnosis not present

## 2016-05-15 DIAGNOSIS — D696 Thrombocytopenia, unspecified: Secondary | ICD-10-CM | POA: Diagnosis not present

## 2016-05-15 DIAGNOSIS — Z299 Encounter for prophylactic measures, unspecified: Secondary | ICD-10-CM | POA: Diagnosis not present

## 2016-05-15 DIAGNOSIS — Z6829 Body mass index (BMI) 29.0-29.9, adult: Secondary | ICD-10-CM | POA: Diagnosis not present

## 2016-05-15 DIAGNOSIS — M545 Low back pain: Secondary | ICD-10-CM | POA: Diagnosis not present

## 2016-05-20 DIAGNOSIS — H2513 Age-related nuclear cataract, bilateral: Secondary | ICD-10-CM | POA: Diagnosis not present

## 2016-05-20 DIAGNOSIS — E113293 Type 2 diabetes mellitus with mild nonproliferative diabetic retinopathy without macular edema, bilateral: Secondary | ICD-10-CM | POA: Diagnosis not present

## 2016-05-20 DIAGNOSIS — H5213 Myopia, bilateral: Secondary | ICD-10-CM | POA: Diagnosis not present

## 2016-05-20 DIAGNOSIS — E119 Type 2 diabetes mellitus without complications: Secondary | ICD-10-CM | POA: Diagnosis not present

## 2016-05-22 NOTE — Progress Notes (Signed)
HEMATOLOGY/ONCOLOGY CLINIC NOTE  Date of Service: .05/14/2016  Patient Care Team: [name], MD as PCP - General (Internal Medicine) Danie Binder, MD as Consulting Physician (Gastroenterology)  CHIEF COMPLAINTS/PURPOSE OF CONSULTATION:  "Immature cells in the blood"  HISTORY OF PRESENTING ILLNESS:   [name] is a  70 y.o. 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  Patient has a history of hypertension, diabetes, dyslipidemia, chronic kidney disease, chronic back pain issues, BPH, GERD , thrombocytopenia since at least March 2015 his platelet counts were in the 40-50.k range, splenomegaly documented in 2010 who was referred to Dr. Jacquiline Doe from hematology in March 2013 for thrombocytopenia and immature cells in the blood. Patient reports that he never made the hematology appointment.  He reports that he recently was in the emergency room for chest pain that has been occurring persistently on and off for the last several weeks. He reports that he had a cardiac stress test recently which he was told was positive. He notes that his blood pressure was as high as 200/90 and he has recently been started on a beta blocker. Continues to be on baby aspirin daily. He notes that he will be seeing Dr. Harl Bowie from cardiology for further evaluation tomorrow.   Patient reports that he had routine labs done for his routine physical evaluation on 03/12/2016 which showed a hemoglobin of 13.8k, WBC count of 10.2k with 4.8k neutrophils 1.9k lymphocytes and some increased immature granulocytes and an absolute basophil count of 600. No blasts noted on outside manual differential. His platelet counts on these labs appear to be normal now at 231k. He notes no fevers no chills no night sweats no unexpected weight loss. Notes he has had some dull dragging abdominal pain in his upper abdomen. He reports no  significant alcohol use. No focal symptoms of infection. No known liver disease Smokes one to 2 cigars a day.  Notes that he been having intermittent pain due to his kidney stones that have been quite bothered. No overt hematuria. No dysuria. No flank pain or tenderness currently.  Patient and his wife notes that they have been under a lot of stress due to the fact that 2 of their brother-in-law's past in the last 2 months from heart disease and you're having some issues with her children.  INTERVAL HISTORY  Patient is here for his scheduled followup. He had 2 attempted bone marrow biopsies by interventional radiology under fluoroscopy which did not appear to have a representative bone marrow sample. His counts have spontaneously gotten somewhat better. We discussed in detail all the bone marrow findings and results of his mutation studies on peripheral blood. We discussed the overall findings are consistent with a myeloproliferative neoplasm/MDS and possibly myelofibrosis no mutation studies thus far are negative and bone marrow was not revealing. His left chest wall hematoma has shrunk significantly. He still has some pain. He is accompanied by his wife and daughter and they are in agreement to get a second opinion at [facility]. He reports no other acute new symptoms.  MEDICAL HISTORY:  Past Medical History:  Diagnosis Date  . Anxiety   . Chronic back pain   . Diabetes mellitus   . Gastritis   . GERD (gastroesophageal reflux disease)   . Hypertension   . Renal insufficiency     SURGICAL HISTORY: Past Surgical History:  Procedure  Laterality Date  . BACK SURGERY    . COLONOSCOPY WITH PROPOFOL N/A 02/01/2013   SLF: 1. 23 colon polyps removed. 2 retrieved. 2. Mild diverticulosis in teh sigmoid colon 3. Small internal hemorrhoids 4. The colon is redundant   . ESOPHAGOGASTRODUODENOSCOPY (EGD) WITH PROPOFOL N/A 02/01/2013   SLF: 1. Moderate erosive gastritis  .  POLYPECTOMY N/A 02/01/2013   Procedure: POLYPECTOMY;  Surgeon: Danie Binder, MD;  Location: [facility];  Service: Endoscopy;  Laterality: N/A;    SOCIAL HISTORY: Social History   Social History  . Marital status: Divorced    Spouse name: N/A  . Number of children: N/A  . Years of education: N/A   Occupational History  . retired     Architect   Social History Main Topics  . Smoking status: Former Smoker    Packs/day: 1.00    Years: 20.00    Types: Cigars    Start date: 06/25/1975    Quit date: 08/04/2013  . Smokeless tobacco: Never Used  . 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 Drug use: No  . Sexual activity: Yes    Birth control/ protection: None   Other Topics Concern  . Not on file   Social History Narrative  . No narrative on file    FAMILY HISTORY: Family History  Problem Relation Age of Onset  . Colon cancer Neg Hx     ALLERGIES:  is allergic to hydrocodone.  MEDICATIONS:  Current Outpatient Prescriptions  Medication Sig Dispense Refill  . acetaminophen (TYLENOL) 500 MG tablet Take 500 mg by mouth every 6 (six) hours as needed for mild pain or moderate pain.    . cyclobenzaprine (FLEXERIL) 5 MG tablet Take 1 tablet (5 mg total) by mouth 3 (three) times daily as needed for muscle spasms. 20 tablet 0  . DULoxetine (CYMBALTA) 60 MG capsule Take 1 capsule by mouth daily.    . feeding supplement, ENSURE ENLIVE, (ENSURE ENLIVE) LIQD Take 237 mLs by mouth 2 (two) times daily between meals. 237 mL 12  . lisinopril (PRINIVIL,ZESTRIL) 20 MG tablet Take 20 mg by mouth daily.    . metFORMIN (GLUCOPHAGE) 500 MG tablet Take 500 mg by mouth daily.    . metoprolol succinate (TOPROL-XL) 25 MG 24 hr tablet Take 25 mg by mouth daily.    Marland Kitchen omeprazole (PRILOSEC) 20 MG capsule Take 20 mg by mouth 2 (two) times daily.      No current facility-administered medications for this visit.     REVIEW OF SYSTEMS:    10 Point  review of Systems was done is negative except as noted above.  PHYSICAL EXAMINATION: ECOG PERFORMANCE STATUS: 1 - Symptomatic but completely ambulatory  . Vitals:   05/14/16 1545  BP: (!) 164/78  Pulse: 69  Resp: 18  Temp: 97.9 F (36.6 C)   Filed Weights   05/14/16 1545  Weight: 198 lb 8 oz (90 kg)   .Body mass index is 26.92 kg/m.  GENERAL:alert, in no acute distress and comfortable SKIN: skin color, texture, turgor are normal, no rashes or significant lesions EYES: normal, conjunctiva are pink and non-injected, sclera clear OROPHARYNX:no exudate, no erythema and lips, buccal mucosa, and tongue normal  NECK: supple, no JVD, thyroid normal size, non-tender, without nodularity LYMPH:  no palpable lymphadenopathy in the cervical, axillary or inguinal LUNGS: clear to auscultation with normal respiratory effort HEART: regular rate & rhythm,  no murmurs and no lower extremity edema ABDOMEN: abdomen soft, non-tender, normoactive bowel sounds , No palpable hepatomegaly, palpable splenomegaly for 4-5 cm under the costal margin in the midclavicular line Musculoskeletal: no cyanosis of digits and no clubbing  PSYCH: alert & oriented x 3 with fluent speech NEURO: no focal motor/sensory deficits  LABORATORY DATA:  I have reviewed the data as listed . CBC Latest Ref Rng & Units 05/14/2016 05/01/2016 04/30/2016  WBC 4.0 - 10.5 K/uL 11.3(H) 15.3(H) 12.3(H)  Hemoglobin 13.0 - 17.0 g/dL 11.3(L) 11.4(L) 11.2(L)  Hematocrit 39.0 - 52.0 % 35.0(L) 36.3(L) 33.8(L)  Platelets 150 - 400 K/uL 372 383 435(H)   . CMP Latest Ref Rng & Units 04/30/2016 04/29/2016 04/29/2016  Glucose 65 - 99 mg/dL 125(H) - 127(H)  BUN 6 - 20 mg/dL 26(H) - 26(H)  Creatinine 0.61 - 1.24 mg/dL 1.23 - 1.37(H)  Sodium 135 - 145 mmol/L 140 - 140  Potassium 3.5 - 5.1 mmol/L 4.3 4.6 5.7(H)  Chloride 101 - 111 mmol/L 102 - 104  CO2 22 - 32 mmol/L 32 - 31  Calcium 8.9 - 10.3 mg/dL 9.1 - 9.2  Total Protein 6.5 - 8.1  g/dL - - -  Total Bilirubin 0.3 - 1.2 mg/dL - - -  Alkaline Phos 38 - 126 U/L - - -  AST 15 - 41 U/L - - -  ALT 17 - 63 U/L - - -   BCR-ABL1, CML/ALL, PCR, QUANT  Order: [id number]  Collected:  04/23/2016 11:54 Status:  Edited Result - FINAL   Ref Range & Units 4wk ago  b2a2 transcript % Comment   Comments: (NOTE)       <0.001 %  (sensitivity limit of assay)   b3a2 transcript % Comment   Comments: (NOTE)       <0.001 %  (sensitivity limit of assay)   E1A2 Transcript % Comment   Comments: (NOTE)       <0.001 %  (sensitivity limit of assay)   Interpretation (BCRAL):  Comment   Comments: (NOTE)  The quantitative RT-PCR assay is negative for the b2a2 and b3a2  (p210) and e1a2 (p190) fusion gene transcripts found in chronic  myelogenous leukemia and Philadelphia positive acute lymphocytic  leukemia. These results do not rule out the presence of low levels of  BCR-ABL1 transcript below the level of detection of this assay, or  the presence of rare BCR-ABL1 transcripts not detected by this assay.        JAK2 genotypr  Order: [id number]  Status:  Edited Result - FINAL Visible to patient:  Yes (MyChart) Next appt:  07/18/2016 at 01:30 PM in Oncology ([facility]) Dx:  Leucocytosis; Thrombocytopenia ([place])   30moago  JAK2 GenotypR Comment   Comments: (NOTE)  Result: NEGATIVE for the JAK2 V617F mutation.  Interpretation: The G to T nucleotide change encoding the V617F  mutation was not detected. This result does not rule out the  presence of the JAK2 mutation at a level below the sensitivity of  detection of this assay, or the presence of other mutations within  JAK2 not detected by this assay. This result does not rule out a  diagnosis of polycythemia vera, essential thrombocythemia or  idiopathic myelofibrosis as the V617F mutation is not detected in all  patients with these disorders.          Miscellaneous [facility] test (send-out)  Order: [id number]   Status:  Edited Result - FINAL Visible to patient:  Yes (MyChart) Next appt:  07/18/2016 at 01:30 PM in Oncology ([facility])   2wk ago  [facility] test code [id number]   [facility] test name MYELOPROLIFERATIVE NEOPLASMS WITH HYPEREOSINOPHILIA (MPN/HES) FISH   Source ([facility]) [id number]   Misc [facility] result COMMENT   Comments: (NOTE)  Test Ordered: [id number] MPN w/Hypereosinophilia FISH  Specimen Type         BLOOD           YU   Cells Counted         Comment:         YU   100/PROBE  Cells Analyzed         Comment:         YU   100/PROBE  FISH Result          Comment:         YU   NO DELETION OF CHIC2/LNX OR REARRANGEMENT OF PDGFRA, PDGFRB,  OR FGFR1 OBSERVED                RADIOGRAPHIC STUDIES: I have personally reviewed the radiological images as listed and agreed with the findings in the report. Ct Abdomen Pelvis Wo Contrast  Result Date: 04/23/2016 CLINICAL DATA:  LEFT posterior chest wall mass. Pain and swelling for 1 week EXAM: CT CHEST, ABDOMEN AND PELVIS WITHOUT CONTRAST TECHNIQUE: Multidetector CT imaging of the chest, abdomen and pelvis was performed following the standard protocol without IV contrast. COMPARISON:  CT 02/06/2011, 12/15/2014 FINDINGS: CT CHEST FINDINGS Cardiovascular: Coronary artery calcification and aortic atherosclerotic calcification. Mediastinum/Nodes: No axillary supraclavicular adenopathy. No mediastinal hilar adenopathy. No pericardial fluid. Esophagus Lungs/Pleura: No scattered subpleural pulmonary nodularity. These are numerous and appear benign but less than 5 mm. Musculoskeletal: Beneath the tip of the LEFT shoulder blade, there is thickening of the adjacent musculature to 4.0 x 5.0 cm (image 42, series 2). This soft tissue thickening slightly more dense than adjacent muscle stool tissue density. Lesion measures approximately 6 cm in craniocaudad dimension (image 130, series 5).  No IV contrast administered. CT ABDOMEN PELVIS FINDINGS Hepatobiliary: No focal hepatic lesions noncontrast exam. Normal gallbladder. Pancreas: Normal pancreas Spleen: Spleen is enlarged measuring 21 cm in craniocaudad dimension which compares to 20 cm on CT of 12/15/2014. Adrenals/Urinary Tract: Adrenal glands are normal. LEFT kidney most inferior by the enlarged spleen. No hydronephrosis. Bladder normal. Stomach/Bowel: Stomach, small bowel appendix cecum normal. Moderate volume stool in colon. Rectum normal Vascular/Lymphatic: Abdominal aorta is normal caliber with atherosclerotic calcification. There is no retroperitoneal or periportal lymphadenopathy. No pelvic lymphadenopathy. Reproductive: Prostate normal. Other: Large calcified injection granuloma in the LEFT buttock region measures 4 cm x 2.6 cm. Musculoskeletal: Mottled sclerotic pattern to the bones not changed from prior. IMPRESSION: Chest Impression: 1. Oblong soft tissue mass within the musculature inferior to the LEFT scapula likely represents a intramuscular hematoma. Neoplasm would be less likely but not completely excluded. 2. Coronary artery calcification and aortic atherosclerotic calcification. Abdomen / Pelvis Impression: 1. Marked splenomegaly. 2. Mottled sclerotic pattern within the bones unchanged. These results will be called to the ordering clinician or representative by the Radiologist Assistant, and communication documented in the PACS or zVision Dashboard. Electronically Signed   By: Suzy Bouchard M.D.   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 Leucocytosis spontaneously improved. JAK2 V617F neg , BCR ABL  neg, HES FISH panel neg CALR-pending Peripheral blood flowcytometry -  unrevealing for clonal lymphoproliferative process. BM bX x 2 - limited sample and not representative. Non diagnostic ?dry tap could be from myelofibrosis . Cytogenetics 13q- noted #2 history of thrombocytopenia platelet counts were in the 40-50k in 2015. Recently now with thrombocytosi which has now resolved. #3 history of splenomegaly since 2010 - unclear etiology. Noted to have teardrops in his peripheral blood rule out mild proliferative syndrome especially myelofibrosis. #4 Left chest wall new mass -- CT chest /abd/pelvis was done -- shows large hematoma. No other LNadenopathy. Massive splenomegaly. Plan -all findings including bone marrow bx results discussed with the patient and his family. -Lack of definitive diagnosis and recommended getting a second opinion at [facility] which they are open to and a referral was placed. -Patient's CBC today looks improved spontaneously. -His chest wall hematoma is improving. -CalR mutation is pending and was ordered to be repeated with his next lab draws. -Patient was given copies of all his workup for his records and to take with him for his second opinion consultation.  Return to clinic with Dr Whitney Muse in 2 months with cbc, cmp, LDH  All of the patients questions were answered with apparent satisfaction. The patient knows to call the clinic with any problems, questions or concerns.  I spent 35 minutes counseling the patient face to face. The total time spent in the appointment was 40 minutes and more than 50% was on counseling and direct patient cares.    Sullivan Lone MD [place] AAHIVMS [facility] Hematology/Oncology Physician [facility]  (Office):       [phone] (Work cell):  [phone] (Fax):           [phone]

## 2016-05-27 DIAGNOSIS — Z7984 Long term (current) use of oral hypoglycemic drugs: Secondary | ICD-10-CM | POA: Diagnosis not present

## 2016-05-27 DIAGNOSIS — D473 Essential (hemorrhagic) thrombocythemia: Secondary | ICD-10-CM | POA: Diagnosis not present

## 2016-05-27 DIAGNOSIS — E785 Hyperlipidemia, unspecified: Secondary | ICD-10-CM | POA: Diagnosis not present

## 2016-05-27 DIAGNOSIS — N189 Chronic kidney disease, unspecified: Secondary | ICD-10-CM | POA: Diagnosis not present

## 2016-05-27 DIAGNOSIS — Z79899 Other long term (current) drug therapy: Secondary | ICD-10-CM | POA: Diagnosis not present

## 2016-05-27 DIAGNOSIS — R161 Splenomegaly, not elsewhere classified: Secondary | ICD-10-CM | POA: Diagnosis not present

## 2016-05-27 DIAGNOSIS — Z888 Allergy status to other drugs, medicaments and biological substances status: Secondary | ICD-10-CM | POA: Diagnosis not present

## 2016-05-27 DIAGNOSIS — R079 Chest pain, unspecified: Secondary | ICD-10-CM | POA: Diagnosis not present

## 2016-05-27 DIAGNOSIS — Z87891 Personal history of nicotine dependence: Secondary | ICD-10-CM | POA: Diagnosis not present

## 2016-05-27 DIAGNOSIS — D72829 Elevated white blood cell count, unspecified: Secondary | ICD-10-CM | POA: Diagnosis not present

## 2016-05-27 DIAGNOSIS — I129 Hypertensive chronic kidney disease with stage 1 through stage 4 chronic kidney disease, or unspecified chronic kidney disease: Secondary | ICD-10-CM | POA: Diagnosis not present

## 2016-05-27 DIAGNOSIS — E1122 Type 2 diabetes mellitus with diabetic chronic kidney disease: Secondary | ICD-10-CM | POA: Diagnosis not present

## 2016-05-28 ENCOUNTER — Encounter (HOSPITAL_COMMUNITY): Payer: Self-pay

## 2016-05-28 DIAGNOSIS — D72829 Elevated white blood cell count, unspecified: Secondary | ICD-10-CM | POA: Diagnosis not present

## 2016-05-28 DIAGNOSIS — R0789 Other chest pain: Secondary | ICD-10-CM | POA: Diagnosis not present

## 2016-05-28 DIAGNOSIS — D469 Myelodysplastic syndrome, unspecified: Secondary | ICD-10-CM | POA: Diagnosis not present

## 2016-05-28 DIAGNOSIS — D72824 Basophilia: Secondary | ICD-10-CM | POA: Diagnosis not present

## 2016-05-28 DIAGNOSIS — D473 Essential (hemorrhagic) thrombocythemia: Secondary | ICD-10-CM | POA: Diagnosis not present

## 2016-05-28 DIAGNOSIS — D696 Thrombocytopenia, unspecified: Secondary | ICD-10-CM | POA: Diagnosis not present

## 2016-05-29 ENCOUNTER — Telehealth: Payer: Self-pay | Admitting: Cardiology

## 2016-06-09 ENCOUNTER — Telehealth: Payer: Self-pay | Admitting: Cardiology

## 2016-06-09 ENCOUNTER — Telehealth: Payer: Self-pay | Admitting: *Deleted

## 2016-06-09 NOTE — Telephone Encounter (Signed)
Pt aware and will f/u after completed f/u with oncology

## 2016-06-09 NOTE — Telephone Encounter (Signed)
-----  Message from Arnoldo Lenis, MD sent at 06/09/2016  1:13 PM EST ----- I reviewed notes from [facility], looks like they are still working things up regarding his blood counts and considering another bone marrow biopsy. We will continue to hold off on cath at this time.  J BrancH MD ----- Message ----- From: Massie Maroon, CMA Sent: 05/29/2016   5:23 PM To: Arnoldo Lenis, MD  Pt says oncologist is supposed to call him regarding what oncology plan is regarding blood disorder - says he doesn't really know anything at this point. Says BP has been running high (says 210/100 at oncology visit).   Omare Bilotta ----- Message ----- From: Arnoldo Lenis, MD Sent: 05/29/2016  12:53 PM To: Danyon Mcginness T Dona Walby, CMA  Can we contact this patient and see how his visit with [facility] went and what there thoughts are about his blood disorder? How is his chest wall hematoma doing?   Zandra Abts MD  ----- Message ----- From: Massie Maroon, CMA Sent: 05/28/2016   2:54 PM To: Arnoldo Lenis, MD  This pt seen oncology 10/25 notes in EPIC - I had a reminder to let you know when he seen oncology possible cath  [facility]

## 2016-06-10 DIAGNOSIS — Z87891 Personal history of nicotine dependence: Secondary | ICD-10-CM | POA: Diagnosis not present

## 2016-06-10 DIAGNOSIS — Z885 Allergy status to narcotic agent status: Secondary | ICD-10-CM | POA: Diagnosis not present

## 2016-06-10 DIAGNOSIS — Z1371 Encounter for nonprocreative screening for genetic disease carrier status: Secondary | ICD-10-CM | POA: Diagnosis not present

## 2016-06-10 DIAGNOSIS — Z7984 Long term (current) use of oral hypoglycemic drugs: Secondary | ICD-10-CM | POA: Diagnosis not present

## 2016-06-10 DIAGNOSIS — E1122 Type 2 diabetes mellitus with diabetic chronic kidney disease: Secondary | ICD-10-CM | POA: Diagnosis not present

## 2016-06-10 DIAGNOSIS — N189 Chronic kidney disease, unspecified: Secondary | ICD-10-CM | POA: Diagnosis not present

## 2016-06-10 DIAGNOSIS — D72829 Elevated white blood cell count, unspecified: Secondary | ICD-10-CM | POA: Diagnosis not present

## 2016-06-10 DIAGNOSIS — R1013 Epigastric pain: Secondary | ICD-10-CM | POA: Diagnosis not present

## 2016-06-10 DIAGNOSIS — D72824 Basophilia: Secondary | ICD-10-CM | POA: Diagnosis not present

## 2016-06-10 DIAGNOSIS — Z79899 Other long term (current) drug therapy: Secondary | ICD-10-CM | POA: Diagnosis not present

## 2016-06-10 DIAGNOSIS — S300XXA Contusion of lower back and pelvis, initial encounter: Secondary | ICD-10-CM | POA: Diagnosis not present

## 2016-06-10 DIAGNOSIS — R161 Splenomegaly, not elsewhere classified: Secondary | ICD-10-CM | POA: Diagnosis not present

## 2016-06-10 DIAGNOSIS — D473 Essential (hemorrhagic) thrombocythemia: Secondary | ICD-10-CM | POA: Diagnosis not present

## 2016-06-10 DIAGNOSIS — I129 Hypertensive chronic kidney disease with stage 1 through stage 4 chronic kidney disease, or unspecified chronic kidney disease: Secondary | ICD-10-CM | POA: Diagnosis not present

## 2016-06-20 DIAGNOSIS — I10 Essential (primary) hypertension: Secondary | ICD-10-CM | POA: Diagnosis not present

## 2016-06-20 DIAGNOSIS — Z299 Encounter for prophylactic measures, unspecified: Secondary | ICD-10-CM | POA: Diagnosis not present

## 2016-06-20 DIAGNOSIS — E1122 Type 2 diabetes mellitus with diabetic chronic kidney disease: Secondary | ICD-10-CM | POA: Diagnosis not present

## 2016-06-20 DIAGNOSIS — M542 Cervicalgia: Secondary | ICD-10-CM | POA: Diagnosis not present

## 2016-06-20 DIAGNOSIS — N183 Chronic kidney disease, stage 3 (moderate): Secondary | ICD-10-CM | POA: Diagnosis not present

## 2016-07-18 ENCOUNTER — Other Ambulatory Visit (HOSPITAL_COMMUNITY): Payer: Medicare Other

## 2016-07-18 ENCOUNTER — Ambulatory Visit (HOSPITAL_COMMUNITY): Payer: Medicare Other | Admitting: Hematology & Oncology

## 2016-07-18 DIAGNOSIS — Z87891 Personal history of nicotine dependence: Secondary | ICD-10-CM | POA: Diagnosis not present

## 2016-07-18 DIAGNOSIS — R52 Pain, unspecified: Secondary | ICD-10-CM | POA: Diagnosis not present

## 2016-07-18 DIAGNOSIS — F329 Major depressive disorder, single episode, unspecified: Secondary | ICD-10-CM | POA: Diagnosis not present

## 2016-07-18 DIAGNOSIS — Z299 Encounter for prophylactic measures, unspecified: Secondary | ICD-10-CM | POA: Diagnosis not present

## 2016-07-18 DIAGNOSIS — J069 Acute upper respiratory infection, unspecified: Secondary | ICD-10-CM | POA: Diagnosis not present

## 2016-07-18 DIAGNOSIS — E1122 Type 2 diabetes mellitus with diabetic chronic kidney disease: Secondary | ICD-10-CM | POA: Diagnosis not present

## 2016-07-18 DIAGNOSIS — Z6829 Body mass index (BMI) 29.0-29.9, adult: Secondary | ICD-10-CM | POA: Diagnosis not present

## 2016-07-18 DIAGNOSIS — N183 Chronic kidney disease, stage 3 (moderate): Secondary | ICD-10-CM | POA: Diagnosis not present

## 2016-07-25 DIAGNOSIS — Z6828 Body mass index (BMI) 28.0-28.9, adult: Secondary | ICD-10-CM | POA: Diagnosis not present

## 2016-07-25 DIAGNOSIS — F329 Major depressive disorder, single episode, unspecified: Secondary | ICD-10-CM | POA: Diagnosis not present

## 2016-07-25 DIAGNOSIS — M7550 Bursitis of unspecified shoulder: Secondary | ICD-10-CM | POA: Diagnosis not present

## 2016-07-25 DIAGNOSIS — I10 Essential (primary) hypertension: Secondary | ICD-10-CM | POA: Diagnosis not present

## 2016-07-25 DIAGNOSIS — E1129 Type 2 diabetes mellitus with other diabetic kidney complication: Secondary | ICD-10-CM | POA: Diagnosis not present

## 2016-07-25 DIAGNOSIS — Z299 Encounter for prophylactic measures, unspecified: Secondary | ICD-10-CM | POA: Diagnosis not present

## 2016-07-25 DIAGNOSIS — Z87891 Personal history of nicotine dependence: Secondary | ICD-10-CM | POA: Diagnosis not present

## 2016-07-29 ENCOUNTER — Telehealth: Payer: Self-pay | Admitting: Cardiology

## 2016-07-29 DIAGNOSIS — R109 Unspecified abdominal pain: Secondary | ICD-10-CM | POA: Diagnosis not present

## 2016-07-29 DIAGNOSIS — D469 Myelodysplastic syndrome, unspecified: Secondary | ICD-10-CM | POA: Diagnosis not present

## 2016-07-29 NOTE — Telephone Encounter (Signed)
Dr. Florene Glen hasn't finished dictation on today's OV - nurse will contact office when complete to clarify

## 2016-07-29 NOTE — Telephone Encounter (Signed)
We had been waiting on clearance from his oncologist, did his [facility] doctor tell him to contact us to schedule his cath? I reviewed there records and I don't see anything describing that   Zandra Abts MD

## 2016-07-29 NOTE — Telephone Encounter (Signed)
Please call patient regarding scheduling cath / tg

## 2016-07-29 NOTE — Telephone Encounter (Signed)
Routed to provider - current notes in care everywhere

## 2016-07-30 NOTE — Telephone Encounter (Signed)
Spoke with Darlene nurse for Dr Florene Glen who says that Dr Florene Glen recommended proceeding with cardiologist recommendations - has not finished dictation from 07/29/16 OV.

## 2016-07-30 NOTE — Telephone Encounter (Signed)
Pt aware and agreeable - says he can come 1/15 @1 :30 to see Dr. Harl Bowie in [place]

## 2016-07-30 NOTE — Telephone Encounter (Signed)
Can he come see me in clinic in [place] in the afternoon next week. Its been some time since I last saw him and we need to talk about the latest updates on his symptoms as well as the testing and opinions of his heme/onc doctors.  Zandra Abts MD

## 2016-08-04 ENCOUNTER — Encounter: Payer: Self-pay | Admitting: Cardiology

## 2016-08-04 ENCOUNTER — Encounter: Payer: Self-pay | Admitting: *Deleted

## 2016-08-04 ENCOUNTER — Ambulatory Visit (INDEPENDENT_AMBULATORY_CARE_PROVIDER_SITE_OTHER): Payer: Medicare Other | Admitting: Cardiology

## 2016-08-04 VITALS — BP 170/80 | HR 68 | Ht 72.0 in | Wt 190.0 lb

## 2016-08-04 DIAGNOSIS — R9439 Abnormal result of other cardiovascular function study: Secondary | ICD-10-CM | POA: Diagnosis not present

## 2016-08-04 DIAGNOSIS — R079 Chest pain, unspecified: Secondary | ICD-10-CM

## 2016-08-04 MED ORDER — ASPIRIN EC 81 MG PO TBEC: 81.0000 mg | DELAYED_RELEASE_TABLET | Freq: Every day | ORAL | 3 refills | Status: DC

## 2016-08-04 NOTE — Patient Instructions (Signed)
Your physician recommends that you schedule a follow-up appointment in: 1 Month with Dr. Harl Bowie   Your physician recommends that you continue on your current medications as directed. Please refer to the Current Medication list given to you today.  Start Aspirin 81 mg Daily   Your physician has requested that you have a cardiac catheterization. Cardiac catheterization is used to diagnose and/or treat various heart conditions. Doctors may recommend this procedure for a number of different reasons. The most common reason is to evaluate chest pain. Chest pain can be a symptom of coronary artery disease (CAD), and cardiac catheterization can show whether plaque is narrowing or blocking your heart's arteries. This procedure is also used to evaluate the valves, as well as measure the blood flow and oxygen levels in different parts of your heart. For further information please visit [email or web address]. Please follow instruction sheet, as given.  If you need a refill on your cardiac medications before your next appointment, please call your pharmacy.  Thank you for choosing [facility]!

## 2016-08-04 NOTE — Progress Notes (Signed)
Clinical Summary Mr. Rill is a 71 y.o.male seen today for follow up of the following medical problems.   1. Abnormal stress test - recent chest pain 3-4 week ago. Pressure like feeling midchest, 7/10 in severity. Isolated episode while sitting. +SOB. Had some diaphoresis. Lasted about 45 minutes. Not positional. Martin Majestic to [facility], refused admissions - since that time mild "twinges" I nchest. No SOB or DOE, though fairly sedentary lifestyle.  - nuclear stress test at [facility] showed large area of inferolateral ischemia.  - CAD risk factors: HTN, DM2, +tobacco cigars several years. Father CVA late 68s. Mother with CAD late 24s.   - admit 03/2016 with atypical chest pain, thought to be shoulder issue based on MRI. Admitted a few weeks later with left chest wall hematoma, unclear etiology.    - continues to have sharp pain midchest, 6/10 in severity. Left hand can go numb. +SOB. Worst with deep breaths. Lasts just a few seconds. Can occur at rest or with activity. Some increase in severithy.   2. CKD -followed by nephrology, renal function has been improving.    3. Thrombocytopenia - . Underoing evalaution by heme for possible myeloproliferative disorder. Bone marrow biopsies have not been diagnostic. 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  - last labs Jul 29, 2016: Hgb 14.2 Plt 257 Cr 1.49  4. Hematoma - admit 04/2016 with left chest wall hematoma. MRI showed large left sided chest wall heamtoma with surrounding edema and inflammation. - symptoms have since resolved   Past Medical History:  Diagnosis Date  . Anxiety   . Chronic back pain   . Diabetes mellitus   . Gastritis   . GERD (gastroesophageal reflux disease)   . Hypertension   . Renal insufficiency      Allergies  Allergen Reactions  . Hydrocodone Itching     Current Outpatient Prescriptions  Medication Sig Dispense Refill  . acetaminophen (TYLENOL) 500 MG tablet  Take 500 mg by mouth every 6 (six) hours as needed for mild pain or moderate pain.    . cyclobenzaprine (FLEXERIL) 5 MG tablet Take 1 tablet (5 mg total) by mouth 3 (three) times daily as needed for muscle spasms. 20 tablet 0  . DULoxetine (CYMBALTA) 60 MG capsule Take 1 capsule by mouth daily.    . feeding supplement, ENSURE ENLIVE, (ENSURE ENLIVE) LIQD Take 237 mLs by mouth 2 (two) times daily between meals. 237 mL 12  . lisinopril (PRINIVIL,ZESTRIL) 20 MG tablet Take 20 mg by mouth daily.    . metFORMIN (GLUCOPHAGE) 500 MG tablet Take 500 mg by mouth daily.    . metoprolol succinate (TOPROL-XL) 25 MG 24 hr tablet Take 25 mg by mouth daily.    Marland Kitchen omeprazole (PRILOSEC) 20 MG capsule Take 20 mg by mouth 2 (two) times daily.      No current facility-administered medications for this visit.      Past Surgical History:  Procedure Laterality Date  . BACK SURGERY    . COLONOSCOPY WITH PROPOFOL N/A 02/01/2013   SLF: 1. 23 colon polyps removed. 2 retrieved. 2. Mild diverticulosis in teh sigmoid colon 3. Small internal hemorrhoids 4. The colon is redundant   . ESOPHAGOGASTRODUODENOSCOPY (EGD) WITH PROPOFOL N/A 02/01/2013   SLF: 1. Moderate erosive gastritis  . POLYPECTOMY N/A 02/01/2013   Procedure: POLYPECTOMY;  Surgeon: Danie Binder, MD;  Location: [facility];  Service: Endoscopy;  Laterality: N/A;     Allergies  Allergen Reactions  . Hydrocodone  Itching      Family History  Problem Relation Age of Onset  . Colon cancer Neg Hx      Social History Mr. Ashworth reports that he quit smoking about 3 years ago. His smoking use included Cigars. He started smoking about 41 years ago. He has a 20.00 pack-year smoking history. He has never used smokeless tobacco. Mr. Robinette reports that he does not drink alcohol.   Review of Systems CONSTITUTIONAL: No weight loss, fever, chills, weakness or fatigue.  HEENT: Eyes: No visual loss, blurred vision, double vision or yellow sclerae.No hearing loss,  sneezing, congestion, runny nose or sore throat.  SKIN: No rash or itching.  CARDIOVASCULAR: per HPI RESPIRATORY: No shortness of breath, cough or sputum.  GASTROINTESTINAL: No anorexia, nausea, vomiting or diarrhea. No abdominal pain or blood.  GENITOURINARY: No burning on urination, no polyuria NEUROLOGICAL: No headache, dizziness, syncope, paralysis, ataxia, numbness or tingling in the extremities. No change in bowel or bladder control.  MUSCULOSKELETAL: No muscle, back pain, joint pain or stiffness.  LYMPHATICS: No enlarged nodes. No history of splenectomy.  PSYCHIATRIC: No history of depression or anxiety.  ENDOCRINOLOGIC: No reports of sweating, cold or heat intolerance. No polyuria or polydipsia.     Physical Examination Vitals:   08/04/16 1337  BP: (!) 170/80  Pulse: 68   Vitals:   08/04/16 1337  Weight: 190 lb (86.2 kg)  Height: 6' (1.829 m)    Gen: resting comfortably, no acute distress HEENT: no scleral icterus, pupils equal round and reactive, no palptable cervical adenopathy,  CV: RRR, no m/r/g, no jvd Resp: Clear to auscultation bilaterally GI: abdomen is soft, non-tender, non-distended, normal bowel sounds, no hepatosplenomegaly MSK: extremities are warm, no edema.  Skin: warm, no rash Neuro:  no focal deficits Psych: appropriate affect      Assessment and Plan  1. Chest pain - chest pain symptoms are fairly atypical however  recent abnormal nuclear stress test, multiple CAD risk factors - cath had been delayed due to ongoing workup for possible myeloproliferative disorder, as well as prior thrombocytopenia and renal dysfunction. Cell counts have normalized, renal function significantly improved. Previous chest wall hematoma has resolved - we will plan to proceed with heart cath. 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  *recent labs [facility] everywhere.    2. CKD  - renal function improved, at point where can proceed with cath.      Arnoldo Lenis, M.D.

## 2016-08-05 ENCOUNTER — Other Ambulatory Visit (HOSPITAL_COMMUNITY)
Admission: RE | Admit: 2016-08-05 | Discharge: 2016-08-05 | Disposition: A | Discharge status: Home / Self Care | Payer: Medicare Other | Source: Ambulatory Visit | Attending: Cardiology | Admitting: Cardiology

## 2016-08-05 DIAGNOSIS — R079 Chest pain, unspecified: Secondary | ICD-10-CM | POA: Insufficient documentation

## 2016-08-05 LAB — PROTIME-INR
INR: 1.05
PROTHROMBIN TIME: 13.7 s (ref 11.4–15.2)

## 2016-08-05 LAB — CBC WITH DIFFERENTIAL/PLATELET
BAND NEUTROPHILS: 18 %
BASOS ABS: 0.3 10*3/uL — AB (ref 0.0–0.1)
BLASTS: 0 %
Basophils Relative: 2 %
Eosinophils Absolute: 0.2 10*3/uL (ref 0.0–0.7)
Eosinophils Relative: 1 %
HCT: 45.4 % (ref 39.0–52.0)
HEMOGLOBIN: 15.3 g/dL (ref 13.0–17.0)
Lymphocytes Relative: 25 %
Lymphs Abs: 3.8 10*3/uL (ref 0.7–4.0)
MCH: 29.7 pg (ref 26.0–34.0)
MCHC: 33.7 g/dL (ref 30.0–36.0)
MCV: 88 fL (ref 78.0–100.0)
METAMYELOCYTES PCT: 10 %
MYELOCYTES: 7 %
Monocytes Absolute: 0.6 10*3/uL (ref 0.1–1.0)
Monocytes Relative: 4 %
Neutro Abs: 10.2 10*3/uL — ABNORMAL HIGH (ref 1.7–7.7)
Neutrophils Relative %: 32 %
Platelets: 291 10*3/uL (ref 150–400)
Promyelocytes Absolute: 1 %
RBC: 5.16 MIL/uL (ref 4.22–5.81)
RDW: 21.4 % — ABNORMAL HIGH (ref 11.5–15.5)
WBC: 15.1 10*3/uL — ABNORMAL HIGH (ref 4.0–10.5)
nRBC: 0 /100 WBC

## 2016-08-05 LAB — BASIC METABOLIC PANEL
ANION GAP: 7 (ref 5–15)
BUN: 23 mg/dL — ABNORMAL HIGH (ref 6–20)
CALCIUM: 8.3 mg/dL — AB (ref 8.9–10.3)
CO2: 26 mmol/L (ref 22–32)
Chloride: 105 mmol/L (ref 101–111)
Creatinine, Ser: 1.46 mg/dL — ABNORMAL HIGH (ref 0.61–1.24)
GFR, EST AFRICAN AMERICAN: 54 mL/min — AB (ref >60)
GFR, EST NON AFRICAN AMERICAN: 47 mL/min — AB (ref >60)
Glucose, Bld: 159 mg/dL — ABNORMAL HIGH (ref 65–99)
Potassium: 4.5 mmol/L (ref 3.5–5.1)
SODIUM: 138 mmol/L (ref 135–145)

## 2016-08-12 ENCOUNTER — Ambulatory Visit (HOSPITAL_COMMUNITY)
Admission: RE | Admit: 2016-08-12 | Discharge: 2016-08-14 | Disposition: A | Discharge status: Home / Self Care | Payer: Medicare Other | Source: Ambulatory Visit | Attending: Cardiovascular Disease | Admitting: Cardiovascular Disease

## 2016-08-12 ENCOUNTER — Encounter (HOSPITAL_COMMUNITY): Payer: Self-pay | Admitting: Interventional Cardiology

## 2016-08-12 ENCOUNTER — Encounter (HOSPITAL_COMMUNITY)
Admission: RE | Disposition: A | Discharge status: Home / Self Care | Payer: Self-pay | Source: Ambulatory Visit | Attending: Cardiovascular Disease

## 2016-08-12 DIAGNOSIS — N186 End stage renal disease: Secondary | ICD-10-CM | POA: Diagnosis present

## 2016-08-12 DIAGNOSIS — I2510 Atherosclerotic heart disease of native coronary artery without angina pectoris: Secondary | ICD-10-CM

## 2016-08-12 DIAGNOSIS — R9439 Abnormal result of other cardiovascular function study: Secondary | ICD-10-CM

## 2016-08-12 DIAGNOSIS — Z87891 Personal history of nicotine dependence: Secondary | ICD-10-CM | POA: Insufficient documentation

## 2016-08-12 DIAGNOSIS — I672 Cerebral atherosclerosis: Secondary | ICD-10-CM | POA: Insufficient documentation

## 2016-08-12 DIAGNOSIS — I129 Hypertensive chronic kidney disease with stage 1 through stage 4 chronic kidney disease, or unspecified chronic kidney disease: Secondary | ICD-10-CM | POA: Diagnosis not present

## 2016-08-12 DIAGNOSIS — I10 Essential (primary) hypertension: Secondary | ICD-10-CM

## 2016-08-12 DIAGNOSIS — E785 Hyperlipidemia, unspecified: Secondary | ICD-10-CM | POA: Diagnosis not present

## 2016-08-12 DIAGNOSIS — E1122 Type 2 diabetes mellitus with diabetic chronic kidney disease: Secondary | ICD-10-CM | POA: Insufficient documentation

## 2016-08-12 DIAGNOSIS — Z955 Presence of coronary angioplasty implant and graft: Secondary | ICD-10-CM

## 2016-08-12 DIAGNOSIS — Z7984 Long term (current) use of oral hypoglycemic drugs: Secondary | ICD-10-CM | POA: Insufficient documentation

## 2016-08-12 DIAGNOSIS — R51 Headache: Secondary | ICD-10-CM

## 2016-08-12 DIAGNOSIS — D696 Thrombocytopenia, unspecified: Secondary | ICD-10-CM | POA: Diagnosis not present

## 2016-08-12 DIAGNOSIS — K219 Gastro-esophageal reflux disease without esophagitis: Secondary | ICD-10-CM | POA: Insufficient documentation

## 2016-08-12 DIAGNOSIS — Z79899 Other long term (current) drug therapy: Secondary | ICD-10-CM | POA: Diagnosis not present

## 2016-08-12 DIAGNOSIS — R519 Headache, unspecified: Secondary | ICD-10-CM

## 2016-08-12 DIAGNOSIS — N183 Chronic kidney disease, stage 3 (moderate): Secondary | ICD-10-CM | POA: Insufficient documentation

## 2016-08-12 DIAGNOSIS — R079 Chest pain, unspecified: Secondary | ICD-10-CM | POA: Diagnosis present

## 2016-08-12 DIAGNOSIS — E119 Type 2 diabetes mellitus without complications: Secondary | ICD-10-CM

## 2016-08-12 HISTORY — DX: Major depressive disorder, single episode, unspecified: F32.9

## 2016-08-12 HISTORY — PX: CARDIAC CATHETERIZATION: SHX172

## 2016-08-12 HISTORY — DX: Type 2 diabetes mellitus without complications: E11.9

## 2016-08-12 HISTORY — DX: Low back pain, unspecified: M54.50

## 2016-08-12 HISTORY — DX: Leukemia, unspecified not having achieved remission: C95.90

## 2016-08-12 HISTORY — DX: Depression, unspecified: F32.A

## 2016-08-12 HISTORY — DX: Low back pain: M54.5

## 2016-08-12 HISTORY — DX: Personal history of urinary calculi: Z87.442

## 2016-08-12 HISTORY — DX: Other chronic pain: G89.29

## 2016-08-12 HISTORY — DX: Unspecified osteoarthritis, unspecified site: M19.90

## 2016-08-12 HISTORY — DX: Pneumonia, unspecified organism: J18.9

## 2016-08-12 LAB — POCT ACTIVATED CLOTTING TIME: ACTIVATED CLOTTING TIME: 450 s

## 2016-08-12 LAB — GLUCOSE, CAPILLARY
GLUCOSE-CAPILLARY: 111 mg/dL — AB (ref 65–99)
GLUCOSE-CAPILLARY: 157 mg/dL — AB (ref 65–99)
Glucose-Capillary: 119 mg/dL — ABNORMAL HIGH (ref 65–99)
Glucose-Capillary: 137 mg/dL — ABNORMAL HIGH (ref 65–99)

## 2016-08-12 SURGERY — LEFT HEART CATH AND CORONARY ANGIOGRAPHY

## 2016-08-12 MED ORDER — HYDROMORPHONE HCL 1 MG/ML IJ SOLN
INTRAMUSCULAR | Status: AC
  Filled 2016-08-12: qty 1

## 2016-08-12 MED ORDER — MIDAZOLAM HCL 2 MG/2ML IJ SOLN
INTRAMUSCULAR | Status: AC
  Filled 2016-08-12: qty 2

## 2016-08-12 MED ORDER — LIDOCAINE HCL (PF) 1 % IJ SOLN
INTRAMUSCULAR | Status: AC
  Filled 2016-08-12: qty 30

## 2016-08-12 MED ORDER — SODIUM CHLORIDE 0.9 % WEIGHT BASED INFUSION: 1.0000 mL/kg/h | INTRAVENOUS | Status: DC

## 2016-08-12 MED ORDER — FAMOTIDINE IN NACL 20-0.9 MG/50ML-% IV SOLN
INTRAVENOUS | Status: DC | PRN
  Administered 2016-08-12: 20 mg via INTRAVENOUS

## 2016-08-12 MED ORDER — CLOPIDOGREL BISULFATE 300 MG PO TABS
ORAL_TABLET | ORAL | Status: DC | PRN
  Administered 2016-08-12: 600 mg via ORAL

## 2016-08-12 MED ORDER — CLOPIDOGREL BISULFATE 75 MG PO TABS
75.0000 mg | ORAL_TABLET | Freq: Every day | ORAL | Status: DC
  Administered 2016-08-13 – 2016-08-14 (×2): 75 mg via ORAL
  Filled 2016-08-12 (×2): qty 1

## 2016-08-12 MED ORDER — VERAPAMIL HCL 2.5 MG/ML IV SOLN
INTRAVENOUS | Status: AC
  Filled 2016-08-12: qty 2

## 2016-08-12 MED ORDER — IOPAMIDOL (ISOVUE-370) INJECTION 76%
INTRAVENOUS | Status: AC
  Filled 2016-08-12: qty 100

## 2016-08-12 MED ORDER — SODIUM CHLORIDE 0.9 % IV SOLN: 250.0000 mL | INTRAVENOUS | Status: DC | PRN

## 2016-08-12 MED ORDER — DIPHENHYDRAMINE HCL 50 MG/ML IJ SOLN
25.0000 mg | Freq: Once | INTRAMUSCULAR | Status: AC
  Administered 2016-08-12: 16:00:00 25 mg via INTRAVENOUS
  Filled 2016-08-12: qty 1

## 2016-08-12 MED ORDER — FENTANYL CITRATE (PF) 100 MCG/2ML IJ SOLN
INTRAMUSCULAR | Status: DC | PRN
  Administered 2016-08-12: 50 ug via INTRAVENOUS
  Administered 2016-08-12 (×3): 25 ug via INTRAVENOUS

## 2016-08-12 MED ORDER — FAMOTIDINE IN NACL 20-0.9 MG/50ML-% IV SOLN
INTRAVENOUS | Status: AC
  Filled 2016-08-12: qty 50

## 2016-08-12 MED ORDER — HEPARIN SODIUM (PORCINE) 1000 UNIT/ML IJ SOLN
INTRAMUSCULAR | Status: DC | PRN
  Administered 2016-08-12: 5000 [IU] via INTRAVENOUS

## 2016-08-12 MED ORDER — SODIUM CHLORIDE 0.9 % WEIGHT BASED INFUSION
3.0000 mL/kg/h | INTRAVENOUS | Status: DC
  Administered 2016-08-12: 3 mL/kg/h via INTRAVENOUS

## 2016-08-12 MED ORDER — BIVALIRUDIN 250 MG IV SOLR
INTRAVENOUS | Status: AC
  Filled 2016-08-12: qty 250

## 2016-08-12 MED ORDER — CYCLOBENZAPRINE HCL 10 MG PO TABS
5.0000 mg | ORAL_TABLET | Freq: Three times a day (TID) | ORAL | Status: DC | PRN
  Administered 2016-08-13: 5 mg via ORAL
  Filled 2016-08-12: qty 1

## 2016-08-12 MED ORDER — SODIUM CHLORIDE 0.9 % IV SOLN
1.7500 mg/kg/h | INTRAVENOUS | Status: DC
  Filled 2016-08-12: qty 250

## 2016-08-12 MED ORDER — HYDRALAZINE HCL 20 MG/ML IJ SOLN
10.0000 mg | Freq: Once | INTRAMUSCULAR | Status: AC
  Administered 2016-08-12: 10 mg via INTRAVENOUS
  Filled 2016-08-12: qty 1

## 2016-08-12 MED ORDER — DULOXETINE HCL 60 MG PO CPEP
60.0000 mg | ORAL_CAPSULE | Freq: Every day | ORAL | Status: DC
  Administered 2016-08-13 – 2016-08-14 (×2): 60 mg via ORAL
  Filled 2016-08-12 (×2): qty 1

## 2016-08-12 MED ORDER — ACETAMINOPHEN 325 MG PO TABS: 650.0000 mg | ORAL_TABLET | ORAL | Status: DC | PRN

## 2016-08-12 MED ORDER — SODIUM CHLORIDE 0.9 % IV SOLN
INTRAVENOUS | Status: DC | PRN
  Administered 2016-08-12 (×2): 1.75 mg/kg/h via INTRAVENOUS

## 2016-08-12 MED ORDER — FENTANYL CITRATE (PF) 100 MCG/2ML IJ SOLN
INTRAMUSCULAR | Status: AC
  Filled 2016-08-12: qty 2

## 2016-08-12 MED ORDER — PROCHLORPERAZINE EDISYLATE 5 MG/ML IJ SOLN
10.0000 mg | Freq: Once | INTRAMUSCULAR | Status: AC
  Administered 2016-08-12: 10 mg via INTRAVENOUS
  Filled 2016-08-12: qty 2

## 2016-08-12 MED ORDER — ONDANSETRON HCL 4 MG/2ML IJ SOLN
INTRAMUSCULAR | Status: DC | PRN
  Administered 2016-08-12: 4 mg via INTRAVENOUS

## 2016-08-12 MED ORDER — ANGIOPLASTY BOOK
Freq: Once | Status: AC
  Administered 2016-08-12: 23:00:00
  Filled 2016-08-12: qty 1

## 2016-08-12 MED ORDER — ONDANSETRON HCL 4 MG/2ML IJ SOLN
4.0000 mg | Freq: Four times a day (QID) | INTRAMUSCULAR | Status: DC | PRN
  Administered 2016-08-13: 18:00:00 4 mg via INTRAVENOUS
  Filled 2016-08-12: qty 2

## 2016-08-12 MED ORDER — LABETALOL HCL 5 MG/ML IV SOLN
10.0000 mg | INTRAVENOUS | Status: DC | PRN
  Administered 2016-08-12: 14:00:00 10 mg via INTRAVENOUS
  Filled 2016-08-12 (×2): qty 4

## 2016-08-12 MED ORDER — HYDROMORPHONE HCL 1 MG/ML IJ SOLN
INTRAMUSCULAR | Status: DC | PRN
  Administered 2016-08-12 (×2): 1 mg via INTRAVENOUS

## 2016-08-12 MED ORDER — SODIUM CHLORIDE 0.9% FLUSH
3.0000 mL | INTRAVENOUS | Status: DC | PRN
Start: 2016-08-12 — End: 2016-08-14
  Administered 2016-08-12: 16:00:00 10 mL via INTRAVENOUS
  Filled 2016-08-12: qty 3

## 2016-08-12 MED ORDER — SODIUM CHLORIDE 0.9% FLUSH: 3.0000 mL | Freq: Two times a day (BID) | INTRAVENOUS | Status: DC

## 2016-08-12 MED ORDER — HYDRALAZINE HCL 20 MG/ML IJ SOLN: 5.0000 mg | INTRAMUSCULAR | Status: DC | PRN

## 2016-08-12 MED ORDER — SODIUM CHLORIDE 0.9 % IV SOLN
INTRAVENOUS | Status: DC
  Administered 2016-08-12 (×2): via INTRAVENOUS

## 2016-08-12 MED ORDER — ENSURE ENLIVE PO LIQD
237.0000 mL | Freq: Two times a day (BID) | ORAL | Status: DC
  Administered 2016-08-12 – 2016-08-13 (×4): 237 mL via ORAL
  Filled 2016-08-12 (×8): qty 237

## 2016-08-12 MED ORDER — LIDOCAINE HCL (PF) 1 % IJ SOLN
INTRAMUSCULAR | Status: DC | PRN
  Administered 2016-08-12: 2 mL via INTRADERMAL

## 2016-08-12 MED ORDER — TRAZODONE HCL 50 MG PO TABS
50.0000 mg | ORAL_TABLET | Freq: Every evening | ORAL | Status: DC | PRN
  Administered 2016-08-12 – 2016-08-13 (×2): 50 mg via ORAL
  Filled 2016-08-12 (×2): qty 1

## 2016-08-12 MED ORDER — BIVALIRUDIN BOLUS VIA INFUSION - CUPID
INTRAVENOUS | Status: DC | PRN
  Administered 2016-08-12: 66 mg via INTRAVENOUS

## 2016-08-12 MED ORDER — METOPROLOL SUCCINATE ER 25 MG PO TB24
25.0000 mg | ORAL_TABLET | Freq: Every day | ORAL | Status: DC
  Administered 2016-08-13: 09:00:00 25 mg via ORAL
  Filled 2016-08-12 (×3): qty 1

## 2016-08-12 MED ORDER — SODIUM CHLORIDE 0.9% FLUSH: 3.0000 mL | INTRAVENOUS | Status: DC | PRN

## 2016-08-12 MED ORDER — MIDAZOLAM HCL 2 MG/2ML IJ SOLN
INTRAMUSCULAR | Status: DC | PRN
  Administered 2016-08-12: 1 mg via INTRAVENOUS
  Administered 2016-08-12: 2 mg via INTRAVENOUS
  Administered 2016-08-12 (×2): 1 mg via INTRAVENOUS

## 2016-08-12 MED ORDER — HYDROMORPHONE HCL 1 MG/ML IJ SOLN
1.0000 mg | INTRAMUSCULAR | Status: DC | PRN
  Administered 2016-08-12 (×2): 2 mg via INTRAVENOUS
  Administered 2016-08-12 (×2): 1 mg via INTRAVENOUS
  Administered 2016-08-13 – 2016-08-14 (×4): 2 mg via INTRAVENOUS
  Filled 2016-08-12: qty 1
  Filled 2016-08-12: qty 2
  Filled 2016-08-12: qty 1
  Filled 2016-08-12 (×4): qty 2
  Filled 2016-08-12: qty 1
  Filled 2016-08-12: qty 2

## 2016-08-12 MED ORDER — ASPIRIN 81 MG PO CHEW
81.0000 mg | CHEWABLE_TABLET | Freq: Every day | ORAL | Status: DC
  Administered 2016-08-13 – 2016-08-14 (×2): 81 mg via ORAL
  Filled 2016-08-12 (×2): qty 1

## 2016-08-12 MED ORDER — HYDRALAZINE HCL 20 MG/ML IJ SOLN
INTRAMUSCULAR | Status: AC
  Filled 2016-08-12: qty 1

## 2016-08-12 MED ORDER — SODIUM CHLORIDE 0.9% FLUSH
3.0000 mL | Freq: Two times a day (BID) | INTRAVENOUS | Status: DC
  Administered 2016-08-12 – 2016-08-13 (×2): 3 mL via INTRAVENOUS

## 2016-08-12 MED ORDER — HYDRALAZINE HCL 20 MG/ML IJ SOLN
INTRAMUSCULAR | Status: DC | PRN
  Administered 2016-08-12: 10 mg via INTRAVENOUS

## 2016-08-12 MED ORDER — ASPIRIN EC 81 MG PO TBEC: 81.0000 mg | DELAYED_RELEASE_TABLET | Freq: Every day | ORAL | Status: DC

## 2016-08-12 MED ORDER — VERAPAMIL HCL 2.5 MG/ML IV SOLN
INTRAVENOUS | Status: DC | PRN
  Administered 2016-08-12: 10 mL via INTRA_ARTERIAL

## 2016-08-12 MED ORDER — ONDANSETRON HCL 4 MG/2ML IJ SOLN
INTRAMUSCULAR | Status: AC
  Filled 2016-08-12: qty 2

## 2016-08-12 MED ORDER — KETOROLAC TROMETHAMINE 15 MG/ML IJ SOLN
15.0000 mg | Freq: Once | INTRAMUSCULAR | Status: AC
  Administered 2016-08-12: 19:00:00 15 mg via INTRAVENOUS
  Filled 2016-08-12: qty 1

## 2016-08-12 MED ORDER — LISINOPRIL 10 MG PO TABS
20.0000 mg | ORAL_TABLET | Freq: Every day | ORAL | Status: DC
  Administered 2016-08-13: 20 mg via ORAL
  Filled 2016-08-12 (×4): qty 2

## 2016-08-12 MED ORDER — PROMETHAZINE HCL 25 MG/ML IJ SOLN
12.5000 mg | Freq: Once | INTRAMUSCULAR | Status: AC
  Administered 2016-08-12: 12.5 mg via INTRAVENOUS
  Filled 2016-08-12: qty 1

## 2016-08-12 MED ORDER — NITROGLYCERIN 1 MG/10 ML FOR IR/CATH LAB
INTRA_ARTERIAL | Status: DC | PRN
  Administered 2016-08-12 (×2): 200 ug via INTRACORONARY

## 2016-08-12 MED ORDER — ACETAMINOPHEN 500 MG PO TABS
1000.0000 mg | ORAL_TABLET | Freq: Four times a day (QID) | ORAL | Status: DC | PRN
  Administered 2016-08-13: 1000 mg via ORAL
  Filled 2016-08-12: qty 2

## 2016-08-12 MED ORDER — ASPIRIN 81 MG PO CHEW: 81.0000 mg | CHEWABLE_TABLET | ORAL | Status: DC

## 2016-08-12 MED ORDER — IOPAMIDOL (ISOVUE-370) INJECTION 76%
INTRAVENOUS | Status: DC | PRN
  Administered 2016-08-12: 175 mL via INTRA_ARTERIAL

## 2016-08-12 SURGICAL SUPPLY — 22 items
BALLN MOZEC 2.50X14 (BALLOONS) ×2
BALLN ~~LOC~~ EMERGE MR 3.5X12 (BALLOONS) ×3
BALLOON MOZEC 2.50X14 (BALLOONS) IMPLANT
BALLOON ~~LOC~~ EMERGE MR 3.5X12 (BALLOONS) IMPLANT
CATH 5FR JL3.5 JR4 ANG PIG MP (CATHETERS) ×2 IMPLANT
CATH EXPO 5F FL3.5 (CATHETERS) ×2 IMPLANT
CATH INFINITI 5 FR 3DRC (CATHETERS) ×2 IMPLANT
DEVICE RAD COMP TR BAND LRG (VASCULAR PRODUCTS) ×2 IMPLANT
GLIDESHEATH SLEND SS 6F .021 (SHEATH) ×2 IMPLANT
GUIDE CATH RUNWAY 6FR CLS3.5 (CATHETERS) ×2 IMPLANT
GUIDEWIRE INQWIRE 1.5J.035X260 (WIRE) IMPLANT
INQWIRE 1.5J .035X260CM (WIRE) ×3
KIT ENCORE 26 ADVANTAGE (KITS) ×2 IMPLANT
KIT HEART LEFT (KITS) ×3 IMPLANT
PACK CARDIAC CATHETERIZATION (CUSTOM PROCEDURE TRAY) ×3 IMPLANT
STENT SYNERGY DES 3X20 (Permanent Stent) ×2 IMPLANT
TRANSDUCER W/STOPCOCK (MISCELLANEOUS) ×3 IMPLANT
TUBING CIL FLEX 10 FLL-RA (TUBING) ×3 IMPLANT
VALVE GUARDIAN II ~~LOC~~ HEMO (MISCELLANEOUS) ×2 IMPLANT
WIRE ASAHI PROWATER 180CM (WIRE) ×2 IMPLANT
WIRE HI TORQ BMW 190CM (WIRE) ×2 IMPLANT
WIRE HI TORQ VERSACORE-J 145CM (WIRE) ×2 IMPLANT

## 2016-08-12 NOTE — Progress Notes (Signed)
BP 174/72.  Pt tried to doze off but said head just hurts too bad to sleep. Room has been dark most of day.  Tried O2 at 2L per recommendation of Brittney PA but pt states no relief. Pupils 40mm round and reactive to light.  Pt denies dizziness, blurred vision, seeing spots, weakness.  IV toradol given.  Report given to [facility].

## 2016-08-12 NOTE — Progress Notes (Signed)
Pt states headache no better but he looks more relaxed, less tension in face.  Now pale and c/o nausea.  IV compazine and benadryl given for headache.  Family remains at bedside, BP 157/111.  SR 80's.  Pt appreciative of care.  Family understands interventions.  Frequent reassessment continues.

## 2016-08-12 NOTE — Progress Notes (Signed)
Pt pale and obviously uncomfortable.  Irritable because he spilled urinal in bed, insisted on getting up to bathroom to use commode.  Assisted up to bathroom.  Voided without difficulty, steady on feet, denied dizziness.  Stood during linen changed and said felt ok.  He still says HA 10/10 but has more color in his face now.  Sitting on side of bed trying to eat.  Nausea better.  States the food is not good but attempting to eat some.    TR BAND REMOVAL  LOCATION:    right radial Radial pulse palpable.  DEFLATED PER PROTOCOL:    Yes.    TIME BAND OFF / DRESSING APPLIED:    1745   SITE UPON ARRIVAL:    Level 0  SITE AFTER BAND REMOVAL:    Level 1  CIRCULATION SENSATION AND MOVEMENT:    Within Normal Limits   Yes.    COMMENTS:   Needs frequent reminders not to use rt hand.  Post radial cath instructions given and reviewed w/ pt and family.

## 2016-08-12 NOTE — Progress Notes (Signed)
  Received call from patient's nurse his BP remains elevated and he is reporting a Migraine headache. BP most recently 200/100. Will administer Hydralazine 10mg  IV and Migraine cocktail (Benadryl and Compazine). Has tried Dilaudid earlier today with no improvement in his symptoms. Reports a rash with Hydrocodone.   Signed, Erma Heritage, PA-C 08/12/2016, 3:57 PM

## 2016-08-12 NOTE — Progress Notes (Signed)
Received report of severe headache postcath despite Dilaudid x3 and IV labetalol and hydralazine earlier.  BP 212/88.  Pt states dilaudid helped very briefly then just kept getting worse.  States hydrocodone causes rash but he still takes it only when he has to.  States tylenol will not help.  Orders received for iv meds.  POC reviewed w/ pt and family.  IV hydralazine given, awaiting other meds from pharmacy approval.

## 2016-08-12 NOTE — H&P (View-Only) (Signed)
Clinical Summary [name] is a 71 y.o.male seen today for follow up of the following medical problems.   1. Abnormal stress test - recent chest pain 3-4 week ago. Pressure like feeling midchest, 7/10 in severity. Isolated episode while sitting. +SOB. Had some diaphoresis. Lasted about 45 minutes. Not positional. [name] to [facility], refused admissions - since that time mild "twinges" I nchest. No SOB or DOE, though fairly sedentary lifestyle.  - nuclear stress test at [facility] showed large area of inferolateral ischemia.  - CAD risk factors: HTN, DM2, +tobacco cigars several years. Father CVA late 40s. Mother with CAD late 100s.   - admit 03/2016 with atypical chest pain, thought to be shoulder issue based on MRI. Admitted a few weeks later with left chest wall hematoma, unclear etiology.    - continues to have sharp pain midchest, 6/10 in severity. Left hand can go numb. +SOB. Worst with deep breaths. Lasts just a few seconds. Can occur at rest or with activity. Some increase in severithy.   2. CKD -followed by nephrology, renal function has been improving.    3. Thrombocytopenia - . Underoing evalaution by heme for possible myeloproliferative disorder. Bone marrow biopsies have not been diagnostic. 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  - last labs Jul 29, 2016: Hgb 14.2 Plt 257 Cr 1.49  4. Hematoma - admit 04/2016 with left chest wall hematoma. MRI showed large left sided chest wall heamtoma with surrounding edema and inflammation. - symptoms have since resolved   Past Medical History:  Diagnosis Date  . Anxiety   . Chronic back pain   . Diabetes mellitus   . Gastritis   . GERD (gastroesophageal reflux disease)   . Hypertension   . Renal insufficiency      Allergies  Allergen Reactions  . Hydrocodone Itching     Current Outpatient Prescriptions  Medication Sig Dispense Refill  . acetaminophen (TYLENOL) 500 MG tablet  Take 500 mg by mouth every 6 (six) hours as needed for mild pain or moderate pain.    . cyclobenzaprine (FLEXERIL) 5 MG tablet Take 1 tablet (5 mg total) by mouth 3 (three) times daily as needed for muscle spasms. 20 tablet 0  . DULoxetine (CYMBALTA) 60 MG capsule Take 1 capsule by mouth daily.    . feeding supplement, ENSURE ENLIVE, (ENSURE ENLIVE) LIQD Take 237 mLs by mouth 2 (two) times daily between meals. 237 mL 12  . lisinopril (PRINIVIL,ZESTRIL) 20 MG tablet Take 20 mg by mouth daily.    . metFORMIN (GLUCOPHAGE) 500 MG tablet Take 500 mg by mouth daily.    . metoprolol succinate (TOPROL-XL) 25 MG 24 hr tablet Take 25 mg by mouth daily.    Marland Kitchen omeprazole (PRILOSEC) 20 MG capsule Take 20 mg by mouth 2 (two) times daily.      No current facility-administered medications for this visit.      Past Surgical History:  Procedure Laterality Date  . BACK SURGERY    . COLONOSCOPY WITH PROPOFOL N/A 02/01/2013   SLF: 1. 23 colon polyps removed. 2 retrieved. 2. Mild diverticulosis in teh sigmoid colon 3. Small internal hemorrhoids 4. The colon is redundant   . ESOPHAGOGASTRODUODENOSCOPY (EGD) WITH PROPOFOL N/A 02/01/2013   SLF: 1. Moderate erosive gastritis  . POLYPECTOMY N/A 02/01/2013   Procedure: POLYPECTOMY;  Surgeon: Danie Binder, MD;  Location: [facility];  Service: Endoscopy;  Laterality: N/A;     Allergies  Allergen Reactions  . Hydrocodone  Itching      Family History  Problem Relation Age of Onset  . Colon cancer Neg Hx      Social History [name] reports that he quit smoking about 3 years ago. His smoking use included Cigars. He started smoking about 41 years ago. He has a 20.00 pack-year smoking history. He has never used smokeless tobacco. [name] reports that he does not drink alcohol.   Review of Systems CONSTITUTIONAL: No weight loss, fever, chills, weakness or fatigue.  HEENT: Eyes: No visual loss, blurred vision, double vision or yellow sclerae.No hearing loss,  sneezing, congestion, runny nose or sore throat.  SKIN: No rash or itching.  CARDIOVASCULAR: per HPI RESPIRATORY: No shortness of breath, cough or sputum.  GASTROINTESTINAL: No anorexia, nausea, vomiting or diarrhea. No abdominal pain or blood.  GENITOURINARY: No burning on urination, no polyuria NEUROLOGICAL: No headache, dizziness, syncope, paralysis, ataxia, numbness or tingling in the extremities. No change in bowel or bladder control.  MUSCULOSKELETAL: No muscle, back pain, joint pain or stiffness.  LYMPHATICS: No enlarged nodes. No history of splenectomy.  PSYCHIATRIC: No history of depression or anxiety.  ENDOCRINOLOGIC: No reports of sweating, cold or heat intolerance. No polyuria or polydipsia.     Physical Examination Vitals:   08/04/16 1337  BP: (!) 170/80  Pulse: 68   Vitals:   08/04/16 1337  Weight: 190 lb (86.2 kg)  Height: 6' (1.829 m)    Gen: resting comfortably, no acute distress HEENT: no scleral icterus, pupils equal round and reactive, no palptable cervical adenopathy,  CV: RRR, no m/r/g, no jvd Resp: Clear to auscultation bilaterally GI: abdomen is soft, non-tender, non-distended, normal bowel sounds, no hepatosplenomegaly MSK: extremities are warm, no edema.  Skin: warm, no rash Neuro:  no focal deficits Psych: appropriate affect      Assessment and Plan  1. Chest pain - chest pain symptoms are fairly atypical however  recent abnormal nuclear stress test, multiple CAD risk factors - cath had been delayed due to ongoing workup for possible myeloproliferative disorder, as well as prior thrombocytopenia and renal dysfunction. Cell counts have normalized, renal function significantly improved. Previous chest wall hematoma has resolved - we will plan to proceed with heart cath. 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  *recent labs [facility] everywhere.    2. CKD  - renal function improved, at point where can proceed with cath.      Arnoldo Lenis, M.D.

## 2016-08-12 NOTE — Interval H&P Note (Signed)
Cath Lab Visit (complete for each Cath Lab visit)  Clinical Evaluation Leading to the Procedure:   ACS: No.  Non-ACS:    Anginal Classification: CCS III  Anti-ischemic medical therapy: Minimal Therapy (1 class of medications)  Non-Invasive Test Results: High-risk stress test findings: cardiac mortality >3%/year  Prior CABG: No previous CABG      History and Physical Interval Note:  08/12/2016 7:39 AM  [name]  has presented today for surgery, with the diagnosis of cp  The various methods of treatment have been discussed with the patient and family. After consideration of risks, benefits and other options for treatment, the patient has consented to  Procedure(s): Left Heart Cath and Coronary Angiography (N/A) as a surgical intervention .  The patient's history has been reviewed, patient examined, no change in status, stable for surgery.  I have reviewed the patient's chart and labs.  Questions were answered to the patient's satisfaction.     Larae Grooms

## 2016-08-12 NOTE — Progress Notes (Signed)
Brittney PA text paged about BP 200/84 and continued headache despite interventions.

## 2016-08-12 NOTE — Care Management Note (Signed)
Case Management Note  Patient Details  Name: [name] MRN: [id number] Date of Birth: April 07, 1946  Subjective/Objective:  S/p coronary stent intervention, will be on plavix, NCM will cont to follow for dc needs.                  Action/Plan:   Expected Discharge Date:                  Expected Discharge Plan:  Home/Self Care  In-House Referral:     Discharge planning Services  CM Consult  Post Acute Care Choice:    Choice offered to:     DME Arranged:    DME Agency:     HH Arranged:    HH Agency:     Status of Service:  In process, will continue to follow  If discussed at Long Length of Stay Meetings, dates discussed:    Additional Comments:  Zenon Mayo, RN 08/12/2016, 6:53 PM

## 2016-08-13 ENCOUNTER — Ambulatory Visit (HOSPITAL_COMMUNITY): Payer: Medicare Other

## 2016-08-13 DIAGNOSIS — Z87891 Personal history of nicotine dependence: Secondary | ICD-10-CM | POA: Diagnosis not present

## 2016-08-13 DIAGNOSIS — K219 Gastro-esophageal reflux disease without esophagitis: Secondary | ICD-10-CM | POA: Diagnosis not present

## 2016-08-13 DIAGNOSIS — Z79899 Other long term (current) drug therapy: Secondary | ICD-10-CM | POA: Diagnosis not present

## 2016-08-13 DIAGNOSIS — D696 Thrombocytopenia, unspecified: Secondary | ICD-10-CM | POA: Diagnosis not present

## 2016-08-13 DIAGNOSIS — I129 Hypertensive chronic kidney disease with stage 1 through stage 4 chronic kidney disease, or unspecified chronic kidney disease: Secondary | ICD-10-CM | POA: Diagnosis not present

## 2016-08-13 DIAGNOSIS — R9439 Abnormal result of other cardiovascular function study: Secondary | ICD-10-CM | POA: Diagnosis not present

## 2016-08-13 DIAGNOSIS — E1122 Type 2 diabetes mellitus with diabetic chronic kidney disease: Secondary | ICD-10-CM | POA: Diagnosis not present

## 2016-08-13 DIAGNOSIS — I639 Cerebral infarction, unspecified: Secondary | ICD-10-CM | POA: Diagnosis not present

## 2016-08-13 DIAGNOSIS — E785 Hyperlipidemia, unspecified: Secondary | ICD-10-CM | POA: Diagnosis not present

## 2016-08-13 DIAGNOSIS — I2510 Atherosclerotic heart disease of native coronary artery without angina pectoris: Secondary | ICD-10-CM | POA: Diagnosis not present

## 2016-08-13 DIAGNOSIS — Z7984 Long term (current) use of oral hypoglycemic drugs: Secondary | ICD-10-CM | POA: Diagnosis not present

## 2016-08-13 DIAGNOSIS — I672 Cerebral atherosclerosis: Secondary | ICD-10-CM | POA: Diagnosis not present

## 2016-08-13 DIAGNOSIS — N183 Chronic kidney disease, stage 3 (moderate): Secondary | ICD-10-CM | POA: Diagnosis not present

## 2016-08-13 LAB — BASIC METABOLIC PANEL
Anion gap: 8 (ref 5–15)
BUN: 18 mg/dL (ref 6–20)
CHLORIDE: 104 mmol/L (ref 101–111)
CO2: 25 mmol/L (ref 22–32)
CREATININE: 1.57 mg/dL — AB (ref 0.61–1.24)
Calcium: 8.5 mg/dL — ABNORMAL LOW (ref 8.9–10.3)
GFR calc Af Amer: 49 mL/min — ABNORMAL LOW (ref >60)
GFR calc non Af Amer: 43 mL/min — ABNORMAL LOW (ref >60)
GLUCOSE: 226 mg/dL — AB (ref 65–99)
Potassium: 3.9 mmol/L (ref 3.5–5.1)
SODIUM: 137 mmol/L (ref 135–145)

## 2016-08-13 LAB — CBC WITH DIFFERENTIAL/PLATELET
BAND NEUTROPHILS: 7 %
BASOS ABS: 0.6 10*3/uL — AB (ref 0.0–0.1)
BASOS PCT: 5 %
Blasts: 0 %
EOS ABS: 0.1 10*3/uL (ref 0.0–0.7)
EOS PCT: 1 %
HCT: 37.5 % — ABNORMAL LOW (ref 39.0–52.0)
Hemoglobin: 12.4 g/dL — ABNORMAL LOW (ref 13.0–17.0)
LYMPHS ABS: 2.9 10*3/uL (ref 0.7–4.0)
Lymphocytes Relative: 25 %
MCH: 29 pg (ref 26.0–34.0)
MCHC: 33.1 g/dL (ref 30.0–36.0)
MCV: 87.8 fL (ref 78.0–100.0)
METAMYELOCYTES PCT: 4 %
MONO ABS: 0.2 10*3/uL (ref 0.1–1.0)
MONOS PCT: 2 %
MYELOCYTES: 15 %
NEUTROS ABS: 7.9 10*3/uL — AB (ref 1.7–7.7)
Neutrophils Relative %: 41 %
Other: 0 %
PLATELETS: 291 10*3/uL (ref 150–400)
Promyelocytes Absolute: 0 %
RBC: 4.27 MIL/uL (ref 4.22–5.81)
RDW: 22.5 % — AB (ref 11.5–15.5)
WBC: 11.7 10*3/uL — AB (ref 4.0–10.5)
nRBC: 0 /100 WBC

## 2016-08-13 LAB — GLUCOSE, CAPILLARY
GLUCOSE-CAPILLARY: 129 mg/dL — AB (ref 65–99)
Glucose-Capillary: 116 mg/dL — ABNORMAL HIGH (ref 65–99)
Glucose-Capillary: 135 mg/dL — ABNORMAL HIGH (ref 65–99)
Glucose-Capillary: 146 mg/dL — ABNORMAL HIGH (ref 65–99)

## 2016-08-13 MED ORDER — HYDRALAZINE HCL 20 MG/ML IJ SOLN
10.0000 mg | Freq: Once | INTRAMUSCULAR | Status: AC
  Administered 2016-08-13: 11:00:00 10 mg via INTRAVENOUS

## 2016-08-13 MED ORDER — HYDRALAZINE HCL 20 MG/ML IJ SOLN
10.0000 mg | Freq: Once | INTRAMUSCULAR | Status: DC
  Filled 2016-08-13: qty 1

## 2016-08-13 MED ORDER — METOPROLOL SUCCINATE ER 50 MG PO TB24: 50.0000 mg | ORAL_TABLET | Freq: Every day | ORAL | Status: DC

## 2016-08-13 MED ORDER — HYDRALAZINE HCL 20 MG/ML IJ SOLN
10.0000 mg | INTRAMUSCULAR | Status: DC | PRN
  Administered 2016-08-13: 10 mg via INTRAVENOUS
  Filled 2016-08-13: qty 1

## 2016-08-13 MED ORDER — PROCHLORPERAZINE EDISYLATE 5 MG/ML IJ SOLN
10.0000 mg | Freq: Four times a day (QID) | INTRAMUSCULAR | Status: DC | PRN
  Administered 2016-08-13: 10 mg via INTRAVENOUS
  Filled 2016-08-13 (×2): qty 2

## 2016-08-13 MED ORDER — PANTOPRAZOLE SODIUM 40 MG PO TBEC
40.0000 mg | DELAYED_RELEASE_TABLET | Freq: Every day | ORAL | Status: DC
  Administered 2016-08-13 – 2016-08-14 (×2): 40 mg via ORAL
  Filled 2016-08-13 (×2): qty 1

## 2016-08-13 MED ORDER — LISINOPRIL 40 MG PO TABS
40.0000 mg | ORAL_TABLET | Freq: Every day | ORAL | Status: DC
  Administered 2016-08-14: 40 mg via ORAL
  Filled 2016-08-13: qty 1

## 2016-08-13 MED ORDER — HYDRALAZINE HCL 25 MG PO TABS
25.0000 mg | ORAL_TABLET | Freq: Three times a day (TID) | ORAL | Status: DC
  Administered 2016-08-13 – 2016-08-14 (×3): 25 mg via ORAL
  Filled 2016-08-13 (×3): qty 1

## 2016-08-13 MED ORDER — METOPROLOL SUCCINATE ER 25 MG PO TB24
25.0000 mg | ORAL_TABLET | Freq: Once | ORAL | Status: AC
  Administered 2016-08-13: 25 mg via ORAL

## 2016-08-13 MED ORDER — NITROGLYCERIN 0.4 MG SL SUBL
0.4000 mg | SUBLINGUAL_TABLET | SUBLINGUAL | Status: DC | PRN
Start: 2016-08-13 — End: 2016-08-13

## 2016-08-13 MED ORDER — ATORVASTATIN CALCIUM 80 MG PO TABS
80.0000 mg | ORAL_TABLET | Freq: Every day | ORAL | Status: DC
  Administered 2016-08-13: 18:00:00 80 mg via ORAL
  Filled 2016-08-13: qty 1

## 2016-08-13 MED ORDER — KETOROLAC TROMETHAMINE 15 MG/ML IJ SOLN
15.0000 mg | Freq: Four times a day (QID) | INTRAMUSCULAR | Status: DC | PRN
  Administered 2016-08-13: 13:00:00 15 mg via INTRAVENOUS
  Filled 2016-08-13 (×2): qty 1

## 2016-08-13 MED ORDER — LISINOPRIL 10 MG PO TABS: 20.0000 mg | ORAL_TABLET | Freq: Once | ORAL | Status: DC

## 2016-08-13 MED ORDER — DIPHENHYDRAMINE HCL 50 MG/ML IJ SOLN
12.5000 mg | Freq: Four times a day (QID) | INTRAMUSCULAR | Status: DC | PRN
  Administered 2016-08-13: 12.5 mg via INTRAVENOUS
  Filled 2016-08-13: qty 1

## 2016-08-13 NOTE — Progress Notes (Signed)
Progress Note  Patient Name: [name] Date of Encounter: 08/13/2016  Primary Cardiologist: Dr Harl Bowie  Subjective   Pt had a severe headache most of the night. He says that NTG usually set off a headache for him. The H/A has eased off to a mild, dull ache. He has a mild, dull uneasiness in the chest, unlike his previous chest pain.   Inpatient Medications    Scheduled Meds: . aspirin  81 mg Oral Daily  . clopidogrel  75 mg Oral Q breakfast  . DULoxetine  60 mg Oral Daily  . feeding supplement (ENSURE ENLIVE)  237 mL Oral BID BM  . lisinopril  20 mg Oral Daily  . metoprolol succinate  25 mg Oral Daily  . sodium chloride flush  3 mL Intravenous Q12H   Continuous Infusions:  PRN Meds: sodium chloride, acetaminophen, acetaminophen, cyclobenzaprine, HYDROmorphone (DILAUDID) injection, ondansetron (ZOFRAN) IV, sodium chloride flush, traZODone   Vital Signs    Vitals:   08/12/16 2200 08/12/16 2300 08/13/16 0200 08/13/16 0300  BP: (!) 154/66 (!) 156/74 (!) 152/67 (!) 154/67  Pulse:      Resp: 15 (!) 9 10 14   Temp:      TempSrc:      SpO2:      Weight:      Height:        Intake/Output Summary (Last 24 hours) at 08/13/16 3151 Last data filed at 08/12/16 2115  Gross per 24 hour  Intake              980 ml  Output                0 ml  Net              980 ml   Filed Weights   08/12/16 0627  Weight: 194 lb (88 kg)    Telemetry    NSR with rates in the 70's-80's - Personally Reviewed  ECG    No new tracing  Physical Exam   GEN: No acute distress.  Neck: No JVD Cardiac: RRR, no murmurs, rubs, or gallops.  Respiratory: Clear to auscultation bilaterally. GI: Soft, nontender, non-distended  MS: No edema; No deformity. Neuro:  AAOx3. Psych: Normal affect  Right wrist without edema, ecchymosis, drainage or tenderness.   Labs    ChemistryNo results for input(s): NA, K, CL, CO2, GLUCOSE, BUN, CREATININE, CALCIUM, PROT, ALBUMIN, AST, ALT, ALKPHOS,  BILITOT, GFRNONAA, GFRAA, ANIONGAP in the last 168 hours.   HematologyNo results for input(s): WBC, RBC, HGB, HCT, MCV, MCH, MCHC, RDW, PLT in the last 168 hours.  Cardiac EnzymesNo results for input(s): TROPONINI in the last 168 hours. No results for input(s): TROPIPOC in the last 168 hours.   BNPNo results for input(s): BNP, PROBNP in the last 168 hours.   DDimer No results for input(s): DDIMER in the last 168 hours.   Radiology    No results found.  Cardiac Studies   08/12/2016 LHC  Dist RCA lesion, 60 %stenosed.  3rd Mrg lesion, 90 %stenosed. This was a small vessel.  Mid LAD lesion, 40 %stenosed.  Ost LAD lesion, 25 %stenosed.  Mid Cx lesion, 95 %stenosed. This was the culprit lesion. A STENT SYNERGY DES 3X20 drug eluting stent was successfully placed., postdilated to 3.5  Post intervention, there is a 0% residual stenosis.  LV end diastolic pressure is normal.  There is no aortic valve stenosis.  Significant tortuosity in the right subclavian. If emergency cath was  needed in the future, would not use right radial approach.   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 A Synergy stent was used to give him the benefit of a drug-eluting stent but also allow for shortening of antiplatelet therapy duration. For now, continue dual antiplatelet therapy. His platelet count is normal. I will speak to Dr. Harl Bowie regarding this issue.   Patient Profile     71 y.o. male with a past medical history significant for HTN, DM2, tobacco use (cigars), CKD, thrombocytopenia suspicious for myelodysplastic syndrome, and GERD presented for LHC following an abnormal stress test performed to evaluate atypical chest pain.  Assessment & Plan    The patient underwent LHC via the right radial approach. A 95% stenosis of the mid circ was found and thought to be the culprit lesion. A Synergy stent was used to give him the benefit of a drug-eluting stent but also allow for shortening of  antiplatelet therapy duration in the setting of thrombocytopenia history. For now, continue dual antiplatelet therapy. His platelet count is normal. Dr Irish Lack to discuss with Dr. Harl Bowie.  CKD- creatinine baseline is 1.37-1.8. Pre-cath Creatinine was 1.46 (08/05/2016). Will check BMP. Pt advised to increase oral fluids to flush kidneys.  Thrombocytopenia- Pre-cath (08/05/16) Platelets 291, Hgb 15.3. Will check CBC.  Initiate statin in setting of CAD with LDL goal of <70. Continue beta blodker, ACE-I. Will need follow up lipid panel and LFT's in 4-6 weeks.   DM2- Hold metofrmin for 48h. Resume 1/26 am  Headache over night likely induced by nitroglycerin, now improved. Elevated blood pressure was likely a reaction to the pain and is now improved.   Plan is for discharge home today.  Signed, Alan Perch, NP  08/13/2016, 6:32 AM    Patient examined right radial fine BP up headache improved Has not ambulated this am Increase ACE and toprol ambulated try to use tylenol for headache got a lot of dilaudid yesterday May need to stay another day   [name]

## 2016-08-13 NOTE — Progress Notes (Addendum)
   Pt with ongoing headache and associated nausea. Has history of migraines and likely set off by nitroglycerin. No visual changes or other neuro symptoms. Will check CT head without contrast. Headache cocktail of benadryl and compazine and toradol ordered.  Pt had mild chest discomfort this morning that he described as "an uneasy feeling", now improved to a mild dullness.  BP still elevated, will add hydralazine 25 mg TID.  Discussed with Dr. Johnsie Cancel.  Will keep tonight and recheck EKG and troponin in the am.  Daune Perch, AGNP-C 1/24/201812:31 PM Pager: [phone]

## 2016-08-13 NOTE — Progress Notes (Signed)
At approximately 0745, patient complains of headache and then complains of chest pain 6/10 the is dull and "just hurts" at times that started at 0600. EKG obtained and patient medicated for H/A, Janine NP notified as well. Dr Johnsie Cancel at bedside to see patient and states to ambulate patient, give patient an additional dosage of scheduled PO blood pressure medications and not to give dilaudid or hydralazine right at this time until re-assess after other additional PO medications of Lisinopril and Lopressor was given. Dr Johnsie Cancel stated to given Hydralazine after this if SBP>170. Patient medicated per order and upon re-assessment at 1045, patient's BP=183/67 HR=78, and patient continues to complain of headache and chest pain that "comes and goes, feels like I am suffocating" medicated patient with Hydralazine and Dilaudid per order and Sherlyn Hay NP made aware. Janine NP states that patient will not be discharged home today and to continue monitoring patient's blood pressure. Cool compress given to patient for head comfort, lights dimmed, and door shut for comfort. No other S/S of distress noted or complaints voiced at this time. SPO2=97% on room air, unlabored breathing noted. Call bell is in reach and bed is in lowest position. Patient states his wife has his stent card and angioplasty book at home.

## 2016-08-13 NOTE — Progress Notes (Signed)
Patient continues to have headache and complains of nausea. Gae Bon NP has been at bedside and seen patient, patient to stay another day. States she will place additional orders in for patient. Patient medicated with benadryl, compazine, and, Toradol at approx. 1300. Patient states he has no more nausea, medication effective. Patient also states his headache is much better rating 5/10 at this time. Patient's BP=168/57 HR=69 NSR no other complaints voiced or distress noted at this time. Call bell is in reach and bed is in lowest position. Lights in room remains dim. No complaints of chest pain at this time.

## 2016-08-13 NOTE — Progress Notes (Signed)
CARDIAC REHAB PHASE I   PRE:  Rate/Rhythm: 78 SR  BP:  Sitting: 195/73        SaO2: 95 RA  MODE:  Ambulation: 300 ft   POST:  Rate/Rhythm: 78 SR  BP:  Sitting: 205/82         SaO2: 96 RA  Pt c/o headache, dull chest discomfort, BP elevated. MD aware, states ok to ambulate. Pt ambulated 300 ft on RA, handheld assist, steady gait, tolerated fairly well.  Pt c/o severe headache, denies worsening cp, dizziness, standing rest x1. Completed PCI/stent education.  Reviewed risk factors, anti-platelet therapy, stent card, activity restrictions, ntg, exercise, heart healthy diet, carb counting, and phase 2 cardiac rehab. Pt verbalized understanding. Pt agrees to phase 2 cardiac rehab referral, will send to [facility] per pt request. Pt to bed per pt request after walk, call bell within reach.    [id number] Lenna Sciara, RN, BSN 08/13/2016 9:07 AM

## 2016-08-14 DIAGNOSIS — R9439 Abnormal result of other cardiovascular function study: Secondary | ICD-10-CM | POA: Diagnosis not present

## 2016-08-14 DIAGNOSIS — E785 Hyperlipidemia, unspecified: Secondary | ICD-10-CM

## 2016-08-14 DIAGNOSIS — Z7984 Long term (current) use of oral hypoglycemic drugs: Secondary | ICD-10-CM | POA: Diagnosis not present

## 2016-08-14 DIAGNOSIS — R519 Headache, unspecified: Secondary | ICD-10-CM

## 2016-08-14 DIAGNOSIS — Z79899 Other long term (current) drug therapy: Secondary | ICD-10-CM | POA: Diagnosis not present

## 2016-08-14 DIAGNOSIS — D696 Thrombocytopenia, unspecified: Secondary | ICD-10-CM | POA: Diagnosis not present

## 2016-08-14 DIAGNOSIS — E1122 Type 2 diabetes mellitus with diabetic chronic kidney disease: Secondary | ICD-10-CM | POA: Diagnosis not present

## 2016-08-14 DIAGNOSIS — R51 Headache: Secondary | ICD-10-CM

## 2016-08-14 DIAGNOSIS — I129 Hypertensive chronic kidney disease with stage 1 through stage 4 chronic kidney disease, or unspecified chronic kidney disease: Secondary | ICD-10-CM | POA: Diagnosis not present

## 2016-08-14 DIAGNOSIS — Z87891 Personal history of nicotine dependence: Secondary | ICD-10-CM | POA: Diagnosis not present

## 2016-08-14 DIAGNOSIS — K219 Gastro-esophageal reflux disease without esophagitis: Secondary | ICD-10-CM | POA: Diagnosis not present

## 2016-08-14 DIAGNOSIS — N183 Chronic kidney disease, stage 3 (moderate): Secondary | ICD-10-CM | POA: Diagnosis not present

## 2016-08-14 DIAGNOSIS — I2510 Atherosclerotic heart disease of native coronary artery without angina pectoris: Secondary | ICD-10-CM | POA: Diagnosis not present

## 2016-08-14 DIAGNOSIS — I10 Essential (primary) hypertension: Secondary | ICD-10-CM

## 2016-08-14 DIAGNOSIS — I672 Cerebral atherosclerosis: Secondary | ICD-10-CM | POA: Diagnosis not present

## 2016-08-14 LAB — BASIC METABOLIC PANEL
Anion gap: 9 (ref 5–15)
BUN: 23 mg/dL — AB (ref 6–20)
CHLORIDE: 103 mmol/L (ref 101–111)
CO2: 25 mmol/L (ref 22–32)
Calcium: 8.8 mg/dL — ABNORMAL LOW (ref 8.9–10.3)
Creatinine, Ser: 1.47 mg/dL — ABNORMAL HIGH (ref 0.61–1.24)
GFR calc Af Amer: 54 mL/min — ABNORMAL LOW (ref >60)
GFR calc non Af Amer: 46 mL/min — ABNORMAL LOW (ref >60)
GLUCOSE: 210 mg/dL — AB (ref 65–99)
POTASSIUM: 4.7 mmol/L (ref 3.5–5.1)
Sodium: 137 mmol/L (ref 135–145)

## 2016-08-14 LAB — CBC
HEMATOCRIT: 37.2 % — AB (ref 39.0–52.0)
Hemoglobin: 12.5 g/dL — ABNORMAL LOW (ref 13.0–17.0)
MCH: 29.5 pg (ref 26.0–34.0)
MCHC: 33.6 g/dL (ref 30.0–36.0)
MCV: 87.7 fL (ref 78.0–100.0)
Platelets: 266 10*3/uL (ref 150–400)
RBC: 4.24 MIL/uL (ref 4.22–5.81)
RDW: 22.5 % — AB (ref 11.5–15.5)
WBC: 11.5 10*3/uL — ABNORMAL HIGH (ref 4.0–10.5)

## 2016-08-14 LAB — GLUCOSE, CAPILLARY
Glucose-Capillary: 140 mg/dL — ABNORMAL HIGH (ref 65–99)
Glucose-Capillary: 108 mg/dL — ABNORMAL HIGH (ref 65–99)

## 2016-08-14 LAB — TROPONIN I
Troponin I: 5.67 ng/mL (ref <0.03)
Troponin I: 4.96 ng/mL (ref <0.03)

## 2016-08-14 MED ORDER — METOPROLOL SUCCINATE ER 50 MG PO TB24
75.0000 mg | ORAL_TABLET | Freq: Every day | ORAL | Status: DC
  Administered 2016-08-14: 75 mg via ORAL
  Filled 2016-08-14: qty 1

## 2016-08-14 MED ORDER — METOPROLOL SUCCINATE ER 50 MG PO TB24: 50.0000 mg | ORAL_TABLET | Freq: Every day | ORAL | 6 refills | Status: DC

## 2016-08-14 MED ORDER — CLOPIDOGREL BISULFATE 75 MG PO TABS: 75.0000 mg | ORAL_TABLET | Freq: Every day | ORAL | 11 refills | Status: DC

## 2016-08-14 MED ORDER — LISINOPRIL 40 MG PO TABS: 40.0000 mg | ORAL_TABLET | Freq: Every day | ORAL | 6 refills | Status: DC

## 2016-08-14 MED ORDER — PANTOPRAZOLE SODIUM 40 MG PO TBEC: 40.0000 mg | DELAYED_RELEASE_TABLET | Freq: Every day | ORAL | 6 refills | Status: DC

## 2016-08-14 MED ORDER — HYDRALAZINE HCL 25 MG PO TABS
25.0000 mg | ORAL_TABLET | Freq: Three times a day (TID) | ORAL | 6 refills | Status: DC
Start: 2016-08-14 — End: 2016-09-16

## 2016-08-14 MED ORDER — ATORVASTATIN CALCIUM 80 MG PO TABS: 80.0000 mg | ORAL_TABLET | Freq: Every day | ORAL | 6 refills | Status: DC

## 2016-08-14 NOTE — Progress Notes (Signed)
Patient has complained of headache with first nurse call at [facility], declines to take tylenol. Pt given dilaudid IV, as ordered. Reports the pain is a 8/10. States the dilaudid doesn't help, pt has called every 2-3 hours for dilaudid-given as ordered q2-3. Still complains of 8/10 headache.

## 2016-08-14 NOTE — Progress Notes (Signed)
CARDIAC REHAB PHASE I   PRE:  Rate/Rhythm: 99 SR  BP:  Sitting: 162/66        SaO2: 92 RA  MODE:  Ambulation: 500 ft   POST:  Rate/Rhythm: 108 ST  BP:  Sitting: 169/71         SaO2: 93 RA  Pt still complaining of headache, states it is somewhat improved, states is chest pain is gone. Pt ambulated 500 ft on RA, independent, steady gait, tolerated well. Pt denies any additional complaints other than gernalized weakness. BP improved, still elevated. Pt states he has no questions regarding education at this time. Pt to bed per pt request after walk, call bell within reach.   [phone] Lenna Sciara, RN, BSN 08/14/2016 8:33 AM

## 2016-08-14 NOTE — Discharge Summary (Signed)
Discharge Summary    Patient ID: [name],  MRN: [id number], DOB/AGE: 12/03/1945 71 y.o.  Admit date: 08/12/2016 Discharge date: 08/14/2016  Primary Care Provider: [facility] Primary Cardiologist: Dr. Harl Bowie  Discharge Diagnoses    Principal Problem:   Abnormal nuclear stress test Active Problems:   Diabetes mellitus (HCC)   CKD (chronic kidney disease) stage 3, GFR 30-59 ml/min   Essential hypertension   Headache   Hyperlipidemia LDL goal <70   CAD s/p mid Cx stent  Allergies Allergies  Allergen Reactions  . Hydrocodone Itching    Diagnostic Studies/Procedures    Coronary Stent Intervention  08/12/16  Left Heart Cath and Coronary Angiography  Conclusion     Dist RCA lesion, 60 %stenosed.  3rd Mrg lesion, 90 %stenosed. This was a small vessel.  Mid LAD lesion, 40 %stenosed.  Ost LAD lesion, 25 %stenosed.  Mid Cx lesion, 95 %stenosed. This was the culprit lesion. A STENT SYNERGY DES 3X20 drug eluting stent was successfully placed., postdilated to 3.5  Post intervention, there is a 0% residual stenosis.  LV end diastolic pressure is normal.  There is no aortic valve stenosis.  Significant tortuosity in the right subclavian. If emergency cath was needed in the future, would not use right radial approach.   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 A Synergy stent was used to give him the benefit of a drug-eluting stent but also allow for shortening of antiplatelet therapy duration. For now, continue dual antiplatelet therapy. His platelet count is normal. I will speak to Dr. Harl Bowie regarding this issue.    Left Heart   Left Ventricle LV end diastolic pressure is normal.    Aortic Valve There is no aortic valve stenosis.        History of Present Illness     71 y.o.malewith a past medical history significant for HTN, DM2, tobacco use (cigars), CKD, thrombocytopenia suspicious for myelodysplastic syndrome, and GERD presented  for LHC following an abnormal stress test performed to evaluate atypical chest pain.  Hospital Course     Consultants:none   1. Chest pain  - cath showed 95% mid Cx s/p synergy stent, usedto give him the benefit of a drug-eluting stent but also allow for shortening of antiplatelet therapy duration in the setting of thrombocytopenia history. For now, continue dual antiplatelet therapy. His platelet count is normal. Dr Irish Lack to discuss with Dr. Harl Bowie. - Troponin  is 5.67 that trended down. EKG with new non specific ST/T changes. Chest pain free. Ambulated well.   2. Headache - likely due to IV nitro , however off > 24 hours. Gots lots of dilaudid in past 2 days. Headache is improved.  CT of head without acute findings. However showed small lacunar infract, ? Chronic. F/u with PCP as outpatient.   3. CKD- creatinine baseline is 1.37-1.8. Pre-cath Creatinine was 1.46 (08/05/2016). Post cath stable to 1.47 at discharge.   4. Thrombocytopenia- stable platelets   5. HLD - No results found for requested labs within last 8760 hours.  LDL goal of <70. Continue statin. Consider OP f/u labs 6-8 weeks given statin initiation this admission. Will need lipid panel as outpatient.   6. HTN - His BP improved with medication changes.  Continue  lisinopril 40mg  and hydralazine 25mg  TID and increased Toprol XL to 75mg  qd.   7. DM2- Hold metofrmin for 48h. Resume 1/26.   The patient has been seen by Dr. Johnsie Cancel  today and deemed ready for discharge  home. All follow-up appointments have been scheduled. Discharge medications are listed below.  _____________   Discharge Vitals Blood pressure (!) 169/71, pulse 91, temperature 99.3 F (37.4 C), temperature source Oral, resp. rate 20, height 6' (1.829 m), weight 194 lb 0.1 oz (88 kg), SpO2 98 %.  Filed Weights   08/12/16 0627 08/13/16 0530 08/14/16 0641  Weight: 194 lb (88 kg) 194 lb 10.7 oz (88.3 kg) 194 lb 0.1 oz (88 kg)    Labs & Radiologic  Studies     CBC  Recent Labs  08/13/16 0809 08/14/16 0756  WBC 11.7* 11.5*  NEUTROABS 7.9*  --   HGB 12.4* 12.5*  HCT 37.5* 37.2*  MCV 87.8 87.7  PLT 291 527   Basic Metabolic Panel  Recent Labs  08/13/16 0809 08/14/16 0756  NA 137 137  K 3.9 4.7  CL 104 103  CO2 25 25  GLUCOSE 226* 210*  BUN 18 23*  CREATININE 1.57* 1.47*  CALCIUM 8.5* 8.8*   Liver Function Tests No results for input(s): AST, ALT, ALKPHOS, BILITOT, PROT, ALBUMIN in the last 72 hours. No results for input(s): LIPASE, AMYLASE in the last 72 hours. Cardiac Enzymes  Recent Labs  08/14/16 0218 08/14/16 0715  TROPONINI 5.67* 4.96*   BNP Invalid input(s): POCBNP D-Dimer No results for input(s): DDIMER in the last 72 hours. Hemoglobin A1C No results for input(s): HGBA1C in the last 72 hours. Fasting Lipid Panel No results for input(s): CHOL, HDL, LDLCALC, TRIG, CHOLHDL, LDLDIRECT in the last 72 hours. Thyroid Function Tests No results for input(s): TSH, T4TOTAL, T3FREE, THYROIDAB in the last 72 hours.  Invalid input(s): FREET3  Ct Head Wo Contrast  Result Date: 08/13/2016 CLINICAL DATA:  Headache, coronary stent placement yesterday EXAM: CT HEAD WITHOUT CONTRAST TECHNIQUE: Contiguous axial images were obtained from the base of the skull through the vertex without intravenous contrast. COMPARISON:  Overlapping portions of CT cervical spine 05/10/2015 FINDINGS: Brain: Lenticular fluid density extra-axial structure anterolaterally along the right middle cranial fossa, 2.5 by 1.2 by 1.5 cm. Small remote lacunar infarct in the right periventricular white matter extending towards the anterior limb right internal capsule, image 16/2. Periventricular white matter and corona radiata hypodensities favor chronic ischemic microvascular white matter disease. No intracranial hemorrhage, mass lesion, or acute CVA. Vascular: Atherosclerotic calcification of the left vertebral artery. There is atherosclerotic  calcification of the cavernous carotid arteries bilaterally. Skull: Unremarkable Sinuses/Orbits: Unremarkable Other: No supplemental non-categorized findings. IMPRESSION: 1. No acute intracranial findings. 2. 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 3. Small lenticular fluid density structure along the right anterolateral middle cranial fossa, potentially a small arachnoid cyst or epidermoid. Focal encephalomalacia from old infarct is a differential diagnostic consideration. 4. Atherosclerotic vascular calcification in the left vertebral artery and cavernous carotid arteries. Electronically Signed   By: Van Clines M.D.   On: 08/13/2016 16:21    Disposition   Pt is being discharged home today in good condition.  Follow-up Plans & Appointments    Follow-up Information    Carlyle Dolly, MD. Go on 09/04/2016.   Specialty:  Cardiology Why:  @1 :40 for post hospital Contact information: [address] [place] [address] [phone]        [facility], MD Follow up.   Specialty:  Internal Medicine Why:  for post hosptial and headache Contact information: [address] [place]  [address] [phone]          Discharge  Instructions    Amb Referral to Cardiac Rehabilitation    Complete by:  As directed    Diagnosis:  Coronary Stents   Call MD for:  redness, tenderness, or signs of infection (pain, swelling, redness, odor or green/yellow discharge around incision site)    Complete by:  As directed    Diet - low sodium heart healthy    Complete by:  As directed    Discharge instructions    Complete by:  As directed    No driving for 24 . No lifting over 5 lbs for 1 week. No sexual activity for 1 week. You may return to work on 08/18/16. Keep procedure site clean & dry. If you notice increased pain, swelling, bleeding or pus, call/return!  You may shower, but no soaking baths/hot tubs/pools  for 1 week.   Hold metformin today. Resume tomorrow..   Some studies suggest Prilosec/Omeprazole interacts with Plavix. We changed your Prilosec/Omeprazole to Protonix for less chance of interaction.   Increase activity slowly    Complete by:  As directed       Discharge Medications   Current Discharge Medication List    START taking these medications   Details  atorvastatin (LIPITOR) 80 MG tablet Take 1 tablet (80 mg total) by mouth daily at 6 PM. Qty: 30 tablet, Refills: 6    clopidogrel (PLAVIX) 75 MG tablet Take 1 tablet (75 mg total) by mouth daily with breakfast. Qty: 30 tablet, Refills: 11    hydrALAZINE (APRESOLINE) 25 MG tablet Take 1 tablet (25 mg total) by mouth every 8 (eight) hours. Qty: 90 tablet, Refills: 6    !! metoprolol succinate (TOPROL-XL) 50 MG 24 hr tablet Take 1 tablet (50 mg total) by mouth daily. Qty: 30 tablet, Refills: 6    pantoprazole (PROTONIX) 40 MG tablet Take 1 tablet (40 mg total) by mouth daily. Qty: 30 tablet, Refills: 6     !! - Potential duplicate medications found. Please discuss with provider.    CONTINUE these medications which have CHANGED   Details  lisinopril (PRINIVIL,ZESTRIL) 40 MG tablet Take 1 tablet (40 mg total) by mouth daily. Qty: 30 tablet, Refills: 6      CONTINUE these medications which have NOT CHANGED   Details  acetaminophen (TYLENOL) 500 MG tablet Take 1,000 mg by mouth every 6 (six) hours as needed for mild pain or moderate pain.     aspirin EC 81 MG tablet Take 1 tablet (81 mg total) by mouth daily. Qty: 90 tablet, Refills: 3    cyclobenzaprine (FLEXERIL) 5 MG tablet Take 1 tablet (5 mg total) by mouth 3 (three) times daily as needed for muscle spasms. Qty: 20 tablet, Refills: 0    DULoxetine (CYMBALTA) 60 MG capsule Take 60 mg by mouth daily.     feeding supplement, ENSURE ENLIVE, (ENSURE ENLIVE) LIQD Take 237 mLs by mouth 2 (two) times daily between meals. Qty: 237 mL, Refills: 12    metFORMIN  (GLUCOPHAGE) 500 MG tablet Take 500 mg by mouth daily.    !! metoprolol succinate (TOPROL-XL) 25 MG 24 hr tablet Take 25 mg by mouth daily.    traZODone (DESYREL) 50 MG tablet Take 50 mg by mouth at bedtime as needed for sleep.    HYDROcodone-acetaminophen (NORCO/VICODIN) 5-325 MG tablet Take 1 tablet by mouth every 8 (eight) hours as needed for pain.     !! - Potential duplicate medications found. Please discuss with provider.    STOP taking these medications  omeprazole (PRILOSEC) 20 MG capsule          Outstanding Labs/Studies   LIPID panel and LFT  Duration of Discharge Encounter   Greater than 30 minutes including physician time.  Signed, Jens Siems PA-C 08/14/2016, 9:18 AM

## 2016-08-14 NOTE — Progress Notes (Signed)
CRITICAL VALUE ALERT  Critical value received:  Troponin 5.67  Date of notification:  08/14/16  Time of notification:  5910   Critical value read back:Yes.    Nurse who received alert:  Helayne Seminole, rn  MD notified (1st page):  Dr. Percival Spanish   Time of first page:  [phone]  Responding MD:  Dr. Percival Spanish  Time MD responded:  [phone]

## 2016-08-14 NOTE — Progress Notes (Addendum)
Progress Note  Patient Name: [name] Date of Encounter: 08/14/2016  Primary Cardiologist: Dr. Harl Bowie  Subjective   He was getting IV dilaudid every few hours. States that his headache has improved this morning but still has 4/10. No chest pain or dyspnea.   Inpatient Medications    Scheduled Meds: . aspirin  81 mg Oral Daily  . atorvastatin  80 mg Oral q1800  . clopidogrel  75 mg Oral Q breakfast  . DULoxetine  60 mg Oral Daily  . feeding supplement (ENSURE ENLIVE)  237 mL Oral BID BM  . hydrALAZINE  10 mg Intravenous Once  . hydrALAZINE  25 mg Oral Q8H  . lisinopril  40 mg Oral Daily  . metoprolol succinate  50 mg Oral Daily  . pantoprazole  40 mg Oral Daily  . sodium chloride flush  3 mL Intravenous Q12H   Continuous Infusions:  PRN Meds: sodium chloride, acetaminophen, acetaminophen, cyclobenzaprine, diphenhydrAMINE, hydrALAZINE, HYDROmorphone (DILAUDID) injection, ketorolac, ondansetron (ZOFRAN) IV, prochlorperazine, sodium chloride flush, traZODone   Vital Signs    Vitals:   08/13/16 2100 08/13/16 2300 08/14/16 0240 08/14/16 0641  BP: (!) 165/60 (!) 148/68 (!) 151/64 (!) 158/72  Pulse:    91  Resp: (!) 27 16 16 19   Temp:    99.3 F (37.4 C)  TempSrc:    Oral  SpO2:    94%  Weight:    194 lb 0.1 oz (88 kg)  Height:        Intake/Output Summary (Last 24 hours) at 08/14/16 0719 Last data filed at 08/13/16 2142  Gross per 24 hour  Intake             1080 ml  Output                0 ml  Net             1080 ml   Filed Weights   08/12/16 0627 08/13/16 0530 08/14/16 0641  Weight: 194 lb (88 kg) 194 lb 10.7 oz (88.3 kg) 194 lb 0.1 oz (88 kg)    Telemetry    NSR with PVCs - Personally Reviewed  ECG    NSR with new TWI in lateral leads with non specific ST changes in inferior leads - Personally Reviewed  Physical Exam   GEN: No acute distress.  Neck: No JVD Cardiac: RRR, no murmurs, rubs, or gallops. R radial cath site without hematoma or  bruit. Mild TTP.  Respiratory: Clear to auscultation bilaterally. GI: Soft, nontender, non-distended  MS: No edema; No deformity. Neuro:  AAOx3. Psych: Normal affect  Labs    Chemistry Recent Labs Lab 08/13/16 0809  NA 137  K 3.9  CL 104  CO2 25  GLUCOSE 226*  BUN 18  CREATININE 1.57*  CALCIUM 8.5*  GFRNONAA 43*  GFRAA 49*  ANIONGAP 8     Hematology Recent Labs Lab 08/13/16 0809  WBC 11.7*  RBC 4.27  HGB 12.4*  HCT 37.5*  MCV 87.8  MCH 29.0  MCHC 33.1  RDW 22.5*  PLT 291    Cardiac Enzymes Recent Labs Lab 08/14/16 0218  TROPONINI 5.67*   No results for input(s): TROPIPOC in the last 168 hours.   BNPNo results for input(s): BNP, PROBNP in the last 168 hours.   DDimer No results for input(s): DDIMER in the last 168 hours.   Radiology    Ct Head Wo Contrast  Result Date: 08/13/2016 CLINICAL DATA:  Headache, coronary stent  placement yesterday EXAM: CT HEAD WITHOUT CONTRAST TECHNIQUE: Contiguous axial images were obtained from the base of the skull through the vertex without intravenous contrast. COMPARISON:  Overlapping portions of CT cervical spine 05/10/2015 FINDINGS: Brain: Lenticular fluid density extra-axial structure anterolaterally along the right middle cranial fossa, 2.5 by 1.2 by 1.5 cm. Small remote lacunar infarct in the right periventricular white matter extending towards the anterior limb right internal capsule, image 16/2. Periventricular white matter and corona radiata hypodensities favor chronic ischemic microvascular white matter disease. No intracranial hemorrhage, mass lesion, or acute CVA. Vascular: Atherosclerotic calcification of the left vertebral artery. There is atherosclerotic calcification of the cavernous carotid arteries bilaterally. Skull: Unremarkable Sinuses/Orbits: Unremarkable Other: No supplemental non-categorized findings. IMPRESSION: 1. No acute intracranial findings. 2. Small lacunar infarct in the right frontal  periventricular white matter extending towards the internal capsule anterior limb. 3. Small lenticular fluid density structure along the right anterolateral middle cranial fossa, potentially a small arachnoid cyst or epidermoid. Focal encephalomalacia from old infarct is a differential diagnostic consideration. 4. Atherosclerotic vascular calcification in the left vertebral artery and cavernous carotid arteries. Electronically Signed   By: Van Clines M.D.   On: 08/13/2016 16:21    Cardiac Studies  Procedures   Coronary Stent Intervention    08/13/16  Left Heart Cath and Coronary Angiography  Conclusion     Dist RCA lesion, 60 %stenosed.  3rd Mrg lesion, 90 %stenosed. This was a small vessel.  Mid LAD lesion, 40 %stenosed.  Ost LAD lesion, 25 %stenosed.  Mid Cx lesion, 95 %stenosed. This was the culprit lesion. A STENT SYNERGY DES 3X20 drug eluting stent was successfully placed., postdilated to 3.5  Post intervention, there is a 0% residual stenosis.  LV end diastolic pressure is normal.  There is no aortic valve stenosis.  Significant tortuosity in the right subclavian. If emergency cath was needed in the future, would not use right radial approach.   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 A Synergy stent was used to give him the benefit of a drug-eluting stent but also allow for shortening of antiplatelet therapy duration. For now, continue dual antiplatelet therapy. His platelet count is normal. I will speak to Dr. Harl Bowie regarding this issue.    CT of head 08/13/16 IMPRESSION: 1. No acute intracranial findings. 2. 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 3. Small lenticular fluid density structure along the right anterolateral middle cranial fossa, potentially a small arachnoid cyst or epidermoid. Focal encephalomalacia from old infarct is a differential diagnostic  consideration. 4. Atherosclerotic vascular calcification in the left vertebral artery and cavernous carotid arteries.  Patient Profile     71 y.o. male with a past medical history significant for HTN, DM2, tobacco use (cigars), CKD, thrombocytopenia suspicious for myelodysplastic syndrome, and GERD presented for LHC following an abnormal stress test performed to evaluate atypical chest pain.  Assessment & Plan    1. Chest pain  - 95% mid Cx s/p synergy stent, used to give him the benefit of a drug-eluting stent but also allow for shortening of antiplatelet therapy duration in the setting of thrombocytopenia history.  For now, continue dual antiplatelet therapy. His platelet count is normal. Dr Irish Lack to discuss with Dr. Harl Bowie. - Troponin today is 5.67. Will trend. EKG with new ST/T wave changes. No chest pain.   2. Headache - likely due to IV nitro , however off > 24 hours. Getting lots of dilaudid  in past 2 days. This morning his headache is improved to 4/10. CT of head without acute findings. However showed small lacunar infract, ? Chronic. Will discussed with MD if need neurology evaluation vs MRI.   3. CKD- creatinine baseline is 1.37-1.8. Pre-cath Creatinine was 1.46 (08/05/2016). Scr of 1.57 yesterday. Pending BMET today.   4. Thrombocytopenia- Platelets 291. Pending CBC today  5. HLD - No results found for requested labs within last 8760 hours.  LDL goal of <70. Continue statin. Consider OP f/u labs 6-8 weeks given statin initiation this admission. Will need lipid panel tomorrow otherwise as outpatient.   6. HTN - His BP still minimally elevated. Continue  lisinopril 40mg  and hydralazine 25mg  TID. Will increase Toprol XL to 75mg  qd.   7. DM2- Hold metofrmin for 48h. Resume 1/26 am  Signed, [place], PA  08/14/2016, 7:19 AM    BP improving Headache improving Right radial artery ok Troponin heading down no chest pain Ok to d/c home with noted med changes for BP  Outpatient f/u [facility] in [facility]

## 2016-08-19 NOTE — Progress Notes (Signed)
This encounter was created in error - please disregard.

## 2016-08-20 DIAGNOSIS — E785 Hyperlipidemia, unspecified: Secondary | ICD-10-CM | POA: Diagnosis not present

## 2016-08-20 DIAGNOSIS — Z6828 Body mass index (BMI) 28.0-28.9, adult: Secondary | ICD-10-CM | POA: Diagnosis not present

## 2016-08-20 DIAGNOSIS — I10 Essential (primary) hypertension: Secondary | ICD-10-CM | POA: Diagnosis not present

## 2016-08-20 DIAGNOSIS — M545 Low back pain: Secondary | ICD-10-CM | POA: Diagnosis not present

## 2016-08-20 DIAGNOSIS — F329 Major depressive disorder, single episode, unspecified: Secondary | ICD-10-CM | POA: Diagnosis not present

## 2016-08-20 DIAGNOSIS — E1122 Type 2 diabetes mellitus with diabetic chronic kidney disease: Secondary | ICD-10-CM | POA: Diagnosis not present

## 2016-08-20 DIAGNOSIS — Z79899 Other long term (current) drug therapy: Secondary | ICD-10-CM | POA: Diagnosis not present

## 2016-08-20 DIAGNOSIS — K219 Gastro-esophageal reflux disease without esophagitis: Secondary | ICD-10-CM | POA: Diagnosis not present

## 2016-08-20 DIAGNOSIS — Z299 Encounter for prophylactic measures, unspecified: Secondary | ICD-10-CM | POA: Diagnosis not present

## 2016-08-20 DIAGNOSIS — N183 Chronic kidney disease, stage 3 (moderate): Secondary | ICD-10-CM | POA: Diagnosis not present

## 2016-08-20 DIAGNOSIS — I2510 Atherosclerotic heart disease of native coronary artery without angina pectoris: Secondary | ICD-10-CM | POA: Diagnosis not present

## 2016-08-20 DIAGNOSIS — Z713 Dietary counseling and surveillance: Secondary | ICD-10-CM | POA: Diagnosis not present

## 2016-09-04 ENCOUNTER — Encounter: Payer: Self-pay | Admitting: Cardiology

## 2016-09-04 ENCOUNTER — Ambulatory Visit (INDEPENDENT_AMBULATORY_CARE_PROVIDER_SITE_OTHER): Payer: Medicare Other | Admitting: Cardiology

## 2016-09-04 VITALS — BP 168/78 | HR 71 | Ht 72.0 in | Wt 191.0 lb

## 2016-09-04 DIAGNOSIS — I2510 Atherosclerotic heart disease of native coronary artery without angina pectoris: Secondary | ICD-10-CM | POA: Diagnosis not present

## 2016-09-04 DIAGNOSIS — I10 Essential (primary) hypertension: Secondary | ICD-10-CM

## 2016-09-04 DIAGNOSIS — R079 Chest pain, unspecified: Secondary | ICD-10-CM

## 2016-09-04 MED ORDER — METOPROLOL SUCCINATE ER 50 MG PO TB24: ORAL_TABLET | ORAL | 3 refills | Status: DC

## 2016-09-04 MED ORDER — NITROGLYCERIN 0.4 MG SL SUBL: 0.4000 mg | SUBLINGUAL_TABLET | SUBLINGUAL | 3 refills | Status: DC | PRN

## 2016-09-04 NOTE — Patient Instructions (Addendum)
Your physician recommends that you schedule a follow-up appointment in: 2 MONTHS WITH DR. BRANCH  Your physician has recommended you make the following change in your medication:   INCREASE TOPROL XL 75 MG DAILY (1 AND 1/2 TABLETS DAILY)  TAKE NITROGLYCERIN 0.4 MG UNDER THE TONGUE AS NEEDED FOR CHEST PAIN   Nitroglycerin sublingual tablets What is this medicine? NITROGLYCERIN (nye troe GLI ser in) is a type of vasodilator. It relaxes blood vessels, increasing the blood and oxygen supply to your heart. This medicine is used to relieve chest pain caused by angina. It is also used to prevent chest pain before activities like climbing stairs, going outdoors in cold weather, or sexual activity. This medicine may be used for other purposes; ask your health care provider or pharmacist if you have questions. COMMON BRAND NAME(S): Nitroquick, Nitrostat, Nitrotab What should I tell my health care provider before I take this medicine? They need to know if you have any of these conditions: -anemia -head injury, recent stroke, or bleeding in the brain -liver disease -previous heart attack -an unusual or allergic reaction to nitroglycerin, other medicines, foods, dyes, or preservatives -pregnant or trying to get pregnant -breast-feeding How should I use this medicine? Take this medicine by mouth as needed. At the first sign of an angina attack (chest pain or tightness) place one tablet under your tongue. You can also take this medicine 5 to 10 minutes before an event likely to produce chest pain. Follow the directions on the prescription label. Let the tablet dissolve under the tongue. Do not swallow whole. Replace the dose if you accidentally swallow it. It will help if your mouth is not dry. Saliva around the tablet will help it to dissolve more quickly. Do not eat or drink, smoke or chew tobacco while a tablet is dissolving. If you are not better within 5 minutes after taking ONE dose of nitroglycerin,  call 9-1-1 immediately to seek emergency medical care. Do not take more than 3 nitroglycerin tablets over 15 minutes. If you take this medicine often to relieve symptoms of angina, your doctor or health care professional may provide you with different instructions to manage your symptoms. If symptoms do not go away after following these instructions, it is important to call 9-1-1 immediately. Do not take more than 3 nitroglycerin tablets over 15 minutes. Talk to your pediatrician regarding the use of this medicine in children. Special care may be needed. Overdosage: If you think you have taken too much of this medicine contact a poison control center or emergency room at once. NOTE: This medicine is only for you. Do not share this medicine with others. What if I miss a dose? This does not apply. This medicine is only used as needed. What may interact with this medicine? Do not take this medicine with any of the following medications: -certain migraine medicines like ergotamine and dihydroergotamine (DHE) -medicines used to treat erectile dysfunction like sildenafil, tadalafil, and vardenafil -riociguat This medicine may also interact with the following medications: -alteplase -aspirin -heparin -medicines for high blood pressure -medicines for mental depression -other medicines used to treat angina -phenothiazines like chlorpromazine, mesoridazine, prochlorperazine, thioridazine This list may not describe all possible interactions. Give your health care provider a list of all the medicines, herbs, non-prescription drugs, or dietary supplements you use. Also tell them if you smoke, drink alcohol, or use illegal drugs. Some items may interact with your medicine. What should I watch for while using this medicine? Tell your doctor or  health care professional if you feel your medicine is no longer working. Keep this medicine with you at all times. Sit or lie down when you take your medicine to  prevent falling if you feel dizzy or faint after using it. Try to remain calm. This will help you to feel better faster. If you feel dizzy, take several deep breaths and lie down with your feet propped up, or bend forward with your head resting between your knees. You may get drowsy or dizzy. Do not drive, use machinery, or do anything that needs mental alertness until you know how this drug affects you. Do not stand or sit up quickly, especially if you are an older patient. This reduces the risk of dizzy or fainting spells. Alcohol can make you more drowsy and dizzy. Avoid alcoholic drinks. Do not treat yourself for coughs, colds, or pain while you are taking this medicine without asking your doctor or health care professional for advice. Some ingredients may increase your blood pressure. What side effects may I notice from receiving this medicine? Side effects that you should report to your doctor or health care professional as soon as possible: -blurred vision -dry mouth -skin rash -sweating -the feeling of extreme pressure in the head -unusually weak or tired Side effects that usually do not require medical attention (report to your doctor or health care professional if they continue or are bothersome): -flushing of the face or neck -headache -irregular heartbeat, palpitations -nausea, vomiting This list may not describe all possible side effects. Call your doctor for medical advice about side effects. You may report side effects to FDA at 1-800-FDA-1088. Where should I keep my medicine? Keep out of the reach of children. Store at room temperature between 20 and 25 degrees C (68 and 77 degrees F). Store in [employer]. Protect from light and moisture. Keep tightly closed. Throw away any unused medicine after the expiration date. NOTE: This sheet is a summary. It may not cover all possible information. If you have questions about this medicine, talk to your doctor, pharmacist, or health  care provider.  2017 Elsevier/Gold Standard (2013-05-05 17:57:36)   Heart-Healthy Eating Plan Introduction Heart-healthy meal planning includes:  Limiting unhealthy fats.  Increasing healthy fats.  Making other small dietary changes. You may need to talk with your doctor or a diet specialist (dietitian) to create an eating plan that is right for you. What types of fat should I choose?  Choose healthy fats. These include olive oil and canola oil, flaxseeds, walnuts, almonds, and seeds.  Eat more omega-3 fats. These include salmon, mackerel, sardines, tuna, flaxseed oil, and ground flaxseeds. Try to eat fish at least twice each week.  Limit saturated fats.  Saturated fats are often found in animal products, such as meats, butter, and cream.  Plant sources of saturated fats include palm oil, palm kernel oil, and coconut oil.  Avoid foods with partially hydrogenated oils in them. These include stick margarine, some tub margarines, cookies, crackers, and other baked goods. These contain trans fats. What general guidelines do I need to follow?  Check food labels carefully. Identify foods with trans fats or high amounts of saturated fat.  Fill one half of your plate with vegetables and green salads. Eat 4-5 servings of vegetables per day. A serving of vegetables is:  1 cup of raw leafy vegetables.   cup of raw or cooked cut-up vegetables.   cup of vegetable juice.  Fill one fourth of your plate with whole grains.  Look for the word "whole" as the first word in the ingredient list.  Fill one fourth of your plate with lean protein foods.  Eat 4-5 servings of fruit per day. A serving of fruit is:  One medium whole fruit.   cup of dried fruit.   cup of fresh, frozen, or canned fruit.   cup of 100% fruit juice.  Eat more foods that contain soluble fiber. These include apples, broccoli, carrots, beans, peas, and barley. Try to get 20-30 g of fiber per day.  Eat more  home-cooked food. Eat less restaurant, buffet, and fast food.  Limit or avoid alcohol.  Limit foods high in starch and sugar.  Avoid fried foods.  Avoid frying your food. Try baking, boiling, grilling, or broiling it instead. You can also reduce fat by:  Removing the skin from poultry.  Removing all visible fats from meats.  Skimming the fat off of stews, soups, and gravies before serving them.  Steaming vegetables in water or broth.  Lose weight if you are overweight.  Eat 4-5 servings of nuts, legumes, and seeds per week:  One serving of dried beans or legumes equals  cup after being cooked.  One serving of nuts equals 1 ounces.  One serving of seeds equals  ounce or one tablespoon.  You may need to keep track of how much salt or sodium you eat. This is especially true if you have high blood pressure. Talk with your doctor or dietitian to get more information. What foods can I eat? Grains  Breads, including [place], white, pita, wheat, raisin, rye, oatmeal, and [place]. Tortillas that are neither fried nor made with lard or trans fat. Low-fat rolls, including hotdog and hamburger buns and English muffins. Biscuits. Muffins. Waffles. Pancakes. Light popcorn. Whole-grain cereals. Flatbread. Melba toast. Pretzels. Breadsticks. Rusks. Low-fat snacks. Low-fat crackers, including oyster, saltine, matzo, graham, animal, and rye. Rice and pasta, including brown rice and pastas that are made with whole wheat. Vegetables  All vegetables. Fruits  All fruits, but limit coconut. Meats and Other Protein Sources  Lean, well-trimmed beef, veal, pork, and lamb. Chicken and [place] without skin. All fish and shellfish. Wild duck, rabbit, pheasant, and venison. Egg whites or low-cholesterol egg substitutes. Dried beans, peas, lentils, and tofu. Seeds and most nuts. Dairy  Low-fat or nonfat cheeses, including ricotta, string, and mozzarella. Skim or 1% milk that is liquid, powdered, or  evaporated. Buttermilk that is made with low-fat milk. Nonfat or low-fat yogurt. Beverages  Mineral water. Diet carbonated beverages. Sweets and Desserts  Sherbets and fruit ices. Honey, jam, marmalade, jelly, and syrups. Meringues and gelatins. Pure sugar candy, such as hard candy, jelly beans, gumdrops, mints, marshmallows, and small amounts of dark chocolate. [employer] Eat all sweets and desserts in moderation. Fats and Oils  Nonhydrogenated (trans-free) margarines. Vegetable oils, including soybean, sesame, sunflower, olive, peanut, safflower, corn, canola, and cottonseed. Salad dressings or mayonnaise made with a vegetable oil. Limit added fats and oils that you use for cooking, baking, salads, and as spreads. Other  Cocoa powder. Coffee and tea. All seasonings and condiments. The items listed above may not be a complete list of recommended foods or beverages. Contact your dietitian for more options.  What foods are not recommended? Grains  Breads that are made with saturated or trans fats, oils, or whole milk. Croissants. Butter rolls. Cheese breads. Sweet rolls. Donuts. Buttered popcorn. Chow mein noodles. High-fat crackers, such as cheese or butter crackers. Meats and Other Protein  Sources  Fatty meats, such as hotdogs, short ribs, sausage, spareribs, bacon, rib eye roast or steak, and mutton. High-fat deli meats, such as salami and bologna. Caviar. Domestic duck and goose. Organ meats, such as kidney, liver, sweetbreads, and heart. Dairy  Cream, sour cream, cream cheese, and creamed cottage cheese. Whole-milk cheeses, including blue (bleu), Monterey Jack, [place], [place], American, [place], Swiss, cheddar, [place], and [place]. Whole or 2% milk that is liquid, evaporated, or condensed. Whole buttermilk. Cream sauce or high-fat cheese sauce. Yogurt that is made from whole milk. Beverages  Regular sodas and juice drinks with added sugar. Sweets and Desserts  Frosting. Pudding.  Cookies. Cakes other than angel food cake. Candy that has milk chocolate or white chocolate, hydrogenated fat, butter, coconut, or unknown ingredients. Buttered syrups. Full-fat ice cream or ice cream drinks. Fats and Oils  Gravy that has suet, meat fat, or shortening. Cocoa butter, hydrogenated oils, palm oil, coconut oil, palm kernel oil. These can often be found in baked products, candy, fried foods, nondairy creamers, and whipped toppings. Solid fats and shortenings, including bacon fat, salt pork, lard, and butter. Nondairy cream substitutes, such as coffee creamers and sour cream substitutes. Salad dressings that are made of unknown oils, cheese, or sour cream. The items listed above may not be a complete list of foods and beverages to avoid. Contact your dietitian for more information.  This information is not intended to replace advice given to you by your health care provider. Make sure you discuss any questions you have with your health care provider. Document Released: 01/06/2012 Document Revised: 12/13/2015 Document Reviewed: 12/29/2013  2017 Elsevier

## 2016-09-04 NOTE — Progress Notes (Signed)
Clinical Summary [name] is a 71 y.o.male seen today for follow up of the following medical problems. This is a focused visit on his history of CAD and chest pain. For more detailed history see prior notes.   1. Abnormal stress test - recent chest pain 3-4 week ago. Pressure like feeling midchest, 7/10 in severity. Isolated episode while sitting. +SOB. Had some diaphoresis. Lasted about 45 minutes. Not positional. Martin Majestic to [facility], refused admissions - since that time mild "twinges" I nchest. No SOB or DOE, though fairly sedentary lifestyle.  - nuclear stress test at [facility] showed large area of inferolateral ischemia.  - CAD risk factors: HTN, DM2, +tobacco cigars several years. Father CVA late 20s. Mother with CAD late 4s.   - admit 03/2016 with atypical chest pain, thought to be shoulder issue based on MRI. Admitted a few weeks later with left chest wall hematoma, unclear etiology.  - continues to have sharp pain midchest, 6/10 in severity. Left hand can go numb. +SOB. Worst with deep breaths. Lasts just a few seconds. Can occur at rest or with activity. Some increase in severithy.    - cath Jan 2018, received DES to LCX. 90% OM3 small vessel, treated medically. 60% RCA medically managed.  - headaches on nitroglycerin  - continues to have sharp pain midchest, 8/10 in severity. Occurrs while at rest. No other associated symptoms. Not related to food. Worst with deep breaths. Lasted 10-15 minutes. Similar to previous pain.       Past Medical History:  Diagnosis Date  . Anxiety   . Arthritis    "knees" (08/12/2016)  . Chronic lower back pain   . Depression    "when my dad passed ~ 2003"  . Gastritis   . GERD (gastroesophageal reflux disease)   . History of kidney stones   . Hypertension   . Kidney pain    "always got pain in my kidneys" (08/12/2016)  . Leukemia ([place])    "don't know what kind" (08/12/2016)  . Pneumonia X 1  . Renal insufficiency   . Type II  diabetes mellitus (HCC)      Allergies  Allergen Reactions  . Hydrocodone Itching     Current Outpatient Prescriptions  Medication Sig Dispense Refill  . acetaminophen (TYLENOL) 500 MG tablet Take 1,000 mg by mouth every 6 (six) hours as needed for mild pain or moderate pain.     Marland Kitchen aspirin EC 81 MG tablet Take 1 tablet (81 mg total) by mouth daily. 90 tablet 3  . atorvastatin (LIPITOR) 80 MG tablet Take 1 tablet (80 mg total) by mouth daily at 6 PM. 30 tablet 6  . clopidogrel (PLAVIX) 75 MG tablet Take 1 tablet (75 mg total) by mouth daily with breakfast. 30 tablet 11  . cyclobenzaprine (FLEXERIL) 5 MG tablet Take 1 tablet (5 mg total) by mouth 3 (three) times daily as needed for muscle spasms. 20 tablet 0  . DULoxetine (CYMBALTA) 60 MG capsule Take 60 mg by mouth daily.     . feeding supplement, ENSURE ENLIVE, (ENSURE ENLIVE) LIQD Take 237 mLs by mouth 2 (two) times daily between meals. 237 mL 12  . hydrALAZINE (APRESOLINE) 25 MG tablet Take 1 tablet (25 mg total) by mouth every 8 (eight) hours. 90 tablet 6  . HYDROcodone-acetaminophen (NORCO/VICODIN) 5-325 MG tablet Take 1 tablet by mouth every 8 (eight) hours as needed for pain.    Marland Kitchen lisinopril (PRINIVIL,ZESTRIL) 40 MG tablet Take 1 tablet (40 mg  total) by mouth daily. 30 tablet 6  . metFORMIN (GLUCOPHAGE) 500 MG tablet Take 500 mg by mouth daily.    . metoprolol succinate (TOPROL-XL) 25 MG 24 hr tablet Take 25 mg by mouth daily.    . metoprolol succinate (TOPROL-XL) 50 MG 24 hr tablet Take 1 tablet (50 mg total) by mouth daily. 30 tablet 6  . pantoprazole (PROTONIX) 40 MG tablet Take 1 tablet (40 mg total) by mouth daily. 30 tablet 6  . traZODone (DESYREL) 50 MG tablet Take 50 mg by mouth at bedtime as needed for sleep.     No current facility-administered medications for this visit.      Past Surgical History:  Procedure Laterality Date  . BACK SURGERY    . BONE MARROW BIOPSY     x 3 times  . CARDIAC CATHETERIZATION N/A  08/12/2016   Procedure: Left Heart Cath and Coronary Angiography;  Surgeon: Alan Booze, MD;  Location: [place] [facility];  Service: Cardiovascular;  Laterality: N/A;  . CARDIAC CATHETERIZATION N/A 08/12/2016   Procedure: Coronary Stent Intervention;  Surgeon: Alan Booze, MD;  Location: [place] [facility];  Service: Cardiovascular;  Laterality: N/A;  . CARDIAC CATHETERIZATION  2000s X 1  . COLONOSCOPY WITH PROPOFOL N/A 02/01/2013   SLF: 1. 23 colon polyps removed. 2 retrieved. 2. Mild diverticulosis in teh sigmoid colon 3. Small internal hemorrhoids 4. The colon is redundant   . CYSTOSCOPY W/ STONE MANIPULATION    . ESOPHAGOGASTRODUODENOSCOPY (EGD) WITH PROPOFOL N/A 02/01/2013   SLF: 1. Moderate erosive gastritis  . [place]  . POLYPECTOMY N/A 02/01/2013   Procedure: POLYPECTOMY;  Surgeon: Danie Binder, MD;  Location: [facility];  Service: Endoscopy;  Laterality: N/A;     Allergies  Allergen Reactions  . Hydrocodone Itching      Family History  Problem Relation Age of Onset  . Heart attack Mother   . CVA Mother   . Heart attack Father   . Colon cancer Neg Hx      Social History Mr. Puls reports that he quit smoking about 3 years ago. His smoking use included Cigars. He started smoking about 41 years ago. He has a 20.00 pack-year smoking history. He has never used smokeless tobacco. Mr. Coia reports that he does not drink alcohol.   Review of Systems CONSTITUTIONAL: No weight loss, fever, chills, weakness or fatigue.  HEENT: Eyes: No visual loss, blurred vision, double vision or yellow sclerae.No hearing loss, sneezing, congestion, runny nose or sore throat.  SKIN: No rash or itching.  CARDIOVASCULAR: per HPI RESPIRATORY: No shortness of breath, cough or sputum.  GASTROINTESTINAL: No anorexia, nausea, vomiting or diarrhea. No abdominal pain or blood.  GENITOURINARY: No burning on urination, no polyuria NEUROLOGICAL: No headache, dizziness,  syncope, paralysis, ataxia, numbness or tingling in the extremities. No change in bowel or bladder control.  MUSCULOSKELETAL: No muscle, back pain, joint pain or stiffness.  LYMPHATICS: No enlarged nodes. No history of splenectomy.  PSYCHIATRIC: No history of depression or anxiety.  ENDOCRINOLOGIC: No reports of sweating, cold or heat intolerance. No polyuria or polydipsia.  Marland Kitchen   Physical Examination Vitals:   09/04/16 1354  BP: (!) 168/78  Pulse: 71   Vitals:   09/04/16 1354  Weight: 191 lb (86.6 kg)  Height: 6' (1.829 m)    Gen: resting comfortably, no acute distress HEENT: no scleral icterus, pupils equal round and reactive, no palptable cervical adenopathy,  CV: RRR, no  m/r/g, no jvd Resp: Clear to auscultation bilaterally GI: abdomen is soft, non-tender, non-distended, normal bowel sounds, no hepatosplenomegaly MSK: extremities are warm, no edema.  Skin: warm, no rash Neuro:  no focal deficits Psych: appropriate affect   Diagnostic Studies Jan 2018 cath  Dist RCA lesion, 60 %stenosed.  3rd Mrg lesion, 90 %stenosed. This was a small vessel.  Mid LAD lesion, 40 %stenosed.  Ost LAD lesion, 25 %stenosed.  Mid Cx lesion, 95 %stenosed. This was the culprit lesion. A STENT SYNERGY DES 3X20 drug eluting stent was successfully placed., postdilated to 3.5  Post intervention, there is a 0% residual stenosis.  LV end diastolic pressure is normal.  There is no aortic valve stenosis.  Significant tortuosity in the right subclavian. If emergency cath was needed in the future, would not use right radial approach.   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 A Synergy stent was used to give him the benefit of a drug-eluting stent but also allow for shortening of antiplatelet therapy duration. For now, continue dual antiplatelet therapy. His platelet count is normal. I will speak to Dr. Harl Bowie regarding this issue.       Assessment and Plan  1.CAD/Chest  pain - s/p recent stent - still with chest pain symptoms at times, fairly atypical. Unclear if CAD related. He did have a 90% small OM2 that was treated medically. - we will increase Toprol XL to '75mg'$  daily as additional antianginal  2. HTN - above goal, increase Toprol XL as described above    Arnoldo Lenis, M.D

## 2016-09-09 DIAGNOSIS — Z87891 Personal history of nicotine dependence: Secondary | ICD-10-CM | POA: Diagnosis not present

## 2016-09-09 DIAGNOSIS — N189 Chronic kidney disease, unspecified: Secondary | ICD-10-CM | POA: Diagnosis not present

## 2016-09-09 DIAGNOSIS — Z885 Allergy status to narcotic agent status: Secondary | ICD-10-CM | POA: Diagnosis not present

## 2016-09-09 DIAGNOSIS — E1122 Type 2 diabetes mellitus with diabetic chronic kidney disease: Secondary | ICD-10-CM | POA: Diagnosis not present

## 2016-09-09 DIAGNOSIS — Z7984 Long term (current) use of oral hypoglycemic drugs: Secondary | ICD-10-CM | POA: Diagnosis not present

## 2016-09-09 DIAGNOSIS — Z79899 Other long term (current) drug therapy: Secondary | ICD-10-CM | POA: Diagnosis not present

## 2016-09-09 DIAGNOSIS — D469 Myelodysplastic syndrome, unspecified: Secondary | ICD-10-CM | POA: Diagnosis not present

## 2016-09-09 DIAGNOSIS — R079 Chest pain, unspecified: Secondary | ICD-10-CM | POA: Diagnosis not present

## 2016-09-09 DIAGNOSIS — I129 Hypertensive chronic kidney disease with stage 1 through stage 4 chronic kidney disease, or unspecified chronic kidney disease: Secondary | ICD-10-CM | POA: Diagnosis not present

## 2016-09-12 ENCOUNTER — Ambulatory Visit: Payer: Medicare Other | Admitting: Cardiology

## 2016-09-15 ENCOUNTER — Encounter (HOSPITAL_COMMUNITY): Payer: Self-pay | Admitting: *Deleted

## 2016-09-15 ENCOUNTER — Inpatient Hospital Stay (HOSPITAL_COMMUNITY): Payer: Medicare Other

## 2016-09-15 ENCOUNTER — Inpatient Hospital Stay (HOSPITAL_COMMUNITY)
Admission: EM | Admit: 2016-09-15 | Discharge: 2016-09-16 | DRG: 291 | Disposition: A | Discharge status: Home / Self Care | Payer: Medicare Other | Attending: Family Medicine | Admitting: Family Medicine

## 2016-09-15 ENCOUNTER — Emergency Department (HOSPITAL_COMMUNITY): Payer: Medicare Other

## 2016-09-15 DIAGNOSIS — I5023 Acute on chronic systolic (congestive) heart failure: Secondary | ICD-10-CM | POA: Diagnosis not present

## 2016-09-15 DIAGNOSIS — N179 Acute kidney failure, unspecified: Secondary | ICD-10-CM | POA: Diagnosis not present

## 2016-09-15 DIAGNOSIS — Z7984 Long term (current) use of oral hypoglycemic drugs: Secondary | ICD-10-CM

## 2016-09-15 DIAGNOSIS — Z87442 Personal history of urinary calculi: Secondary | ICD-10-CM | POA: Diagnosis not present

## 2016-09-15 DIAGNOSIS — J90 Pleural effusion, not elsewhere classified: Secondary | ICD-10-CM

## 2016-09-15 DIAGNOSIS — E785 Hyperlipidemia, unspecified: Secondary | ICD-10-CM | POA: Diagnosis not present

## 2016-09-15 DIAGNOSIS — N400 Enlarged prostate without lower urinary tract symptoms: Secondary | ICD-10-CM | POA: Diagnosis present

## 2016-09-15 DIAGNOSIS — I110 Hypertensive heart disease with heart failure: Secondary | ICD-10-CM | POA: Diagnosis not present

## 2016-09-15 DIAGNOSIS — I2583 Coronary atherosclerosis due to lipid rich plaque: Secondary | ICD-10-CM | POA: Diagnosis present

## 2016-09-15 DIAGNOSIS — Z9221 Personal history of antineoplastic chemotherapy: Secondary | ICD-10-CM

## 2016-09-15 DIAGNOSIS — I25119 Atherosclerotic heart disease of native coronary artery with unspecified angina pectoris: Secondary | ICD-10-CM | POA: Diagnosis not present

## 2016-09-15 DIAGNOSIS — Z7902 Long term (current) use of antithrombotics/antiplatelets: Secondary | ICD-10-CM

## 2016-09-15 DIAGNOSIS — D471 Chronic myeloproliferative disease: Secondary | ICD-10-CM | POA: Diagnosis not present

## 2016-09-15 DIAGNOSIS — Z955 Presence of coronary angioplasty implant and graft: Secondary | ICD-10-CM

## 2016-09-15 DIAGNOSIS — Z823 Family history of stroke: Secondary | ICD-10-CM

## 2016-09-15 DIAGNOSIS — Z7982 Long term (current) use of aspirin: Secondary | ICD-10-CM

## 2016-09-15 DIAGNOSIS — N186 End stage renal disease: Secondary | ICD-10-CM | POA: Diagnosis present

## 2016-09-15 DIAGNOSIS — E1122 Type 2 diabetes mellitus with diabetic chronic kidney disease: Secondary | ICD-10-CM | POA: Diagnosis not present

## 2016-09-15 DIAGNOSIS — I130 Hypertensive heart and chronic kidney disease with heart failure and stage 1 through stage 4 chronic kidney disease, or unspecified chronic kidney disease: Secondary | ICD-10-CM | POA: Diagnosis not present

## 2016-09-15 DIAGNOSIS — E119 Type 2 diabetes mellitus without complications: Secondary | ICD-10-CM

## 2016-09-15 DIAGNOSIS — Z992 Dependence on renal dialysis: Secondary | ICD-10-CM | POA: Diagnosis present

## 2016-09-15 DIAGNOSIS — Z79899 Other long term (current) drug therapy: Secondary | ICD-10-CM

## 2016-09-15 DIAGNOSIS — Z87891 Personal history of nicotine dependence: Secondary | ICD-10-CM | POA: Diagnosis not present

## 2016-09-15 DIAGNOSIS — I2510 Atherosclerotic heart disease of native coronary artery without angina pectoris: Secondary | ICD-10-CM | POA: Diagnosis not present

## 2016-09-15 DIAGNOSIS — I10 Essential (primary) hypertension: Secondary | ICD-10-CM | POA: Diagnosis not present

## 2016-09-15 DIAGNOSIS — Z8249 Family history of ischemic heart disease and other diseases of the circulatory system: Secondary | ICD-10-CM | POA: Diagnosis not present

## 2016-09-15 DIAGNOSIS — N289 Disorder of kidney and ureter, unspecified: Secondary | ICD-10-CM

## 2016-09-15 DIAGNOSIS — I509 Heart failure, unspecified: Secondary | ICD-10-CM

## 2016-09-15 DIAGNOSIS — K219 Gastro-esophageal reflux disease without esophagitis: Secondary | ICD-10-CM | POA: Diagnosis present

## 2016-09-15 DIAGNOSIS — N183 Chronic kidney disease, stage 3 (moderate): Secondary | ICD-10-CM | POA: Diagnosis not present

## 2016-09-15 DIAGNOSIS — D469 Myelodysplastic syndrome, unspecified: Secondary | ICD-10-CM | POA: Diagnosis present

## 2016-09-15 DIAGNOSIS — R0602 Shortness of breath: Secondary | ICD-10-CM | POA: Diagnosis not present

## 2016-09-15 HISTORY — DX: Splenomegaly, not elsewhere classified: R16.1

## 2016-09-15 HISTORY — DX: Benign prostatic hyperplasia without lower urinary tract symptoms: N40.0

## 2016-09-15 HISTORY — DX: Essential (primary) hypertension: I10

## 2016-09-15 HISTORY — DX: Atherosclerotic heart disease of native coronary artery without angina pectoris: I25.10

## 2016-09-15 HISTORY — DX: Myelodysplastic syndrome, unspecified: D46.9

## 2016-09-15 LAB — URINALYSIS, ROUTINE W REFLEX MICROSCOPIC
BACTERIA UA: NONE SEEN
Bilirubin Urine: NEGATIVE
GLUCOSE, UA: NEGATIVE mg/dL
Hgb urine dipstick: NEGATIVE
KETONES UR: NEGATIVE mg/dL
LEUKOCYTES UA: NEGATIVE
Nitrite: NEGATIVE
Specific Gravity, Urine: 1.01 (ref 1.005–1.030)
pH: 6 (ref 5.0–8.0)

## 2016-09-15 LAB — CBC WITH DIFFERENTIAL/PLATELET
Band Neutrophils: 20 %
Basophils Absolute: 0.7 10*3/uL — ABNORMAL HIGH (ref 0.0–0.1)
Basophils Relative: 3 %
Blasts: 0 %
Eosinophils Absolute: 0.5 10*3/uL (ref 0.0–0.7)
Eosinophils Relative: 2 %
HCT: 40.2 % (ref 39.0–52.0)
Hemoglobin: 13.6 g/dL (ref 13.0–17.0)
LYMPHS ABS: 10.4 10*3/uL — AB (ref 0.7–4.0)
LYMPHS PCT: 43 %
MCH: 29.9 pg (ref 26.0–34.0)
MCHC: 33.8 g/dL (ref 30.0–36.0)
MCV: 88.4 fL (ref 78.0–100.0)
MONO ABS: 1.7 10*3/uL — AB (ref 0.1–1.0)
MONOS PCT: 7 %
Metamyelocytes Relative: 10 %
Myelocytes: 2 %
NEUTROS PCT: 13 %
NRBC: 0 /100{WBCs}
Neutro Abs: 10.9 10*3/uL — ABNORMAL HIGH (ref 1.7–7.7)
PLATELETS: 437 10*3/uL — AB (ref 150–400)
Promyelocytes Absolute: 0 %
RBC: 4.55 MIL/uL (ref 4.22–5.81)
RDW: 21.8 % — ABNORMAL HIGH (ref 11.5–15.5)
WBC: 24.2 10*3/uL — AB (ref 4.0–10.5)

## 2016-09-15 LAB — BASIC METABOLIC PANEL
Anion gap: 5 (ref 5–15)
BUN: 28 mg/dL — AB (ref 6–20)
CHLORIDE: 110 mmol/L (ref 101–111)
CO2: 26 mmol/L (ref 22–32)
CREATININE: 1.96 mg/dL — AB (ref 0.61–1.24)
Calcium: 8.3 mg/dL — ABNORMAL LOW (ref 8.9–10.3)
GFR calc Af Amer: 38 mL/min — ABNORMAL LOW (ref >60)
GFR, EST NON AFRICAN AMERICAN: 33 mL/min — AB (ref >60)
GLUCOSE: 128 mg/dL — AB (ref 65–99)
Potassium: 4 mmol/L (ref 3.5–5.1)
SODIUM: 141 mmol/L (ref 135–145)

## 2016-09-15 LAB — URIC ACID: URIC ACID, SERUM: 5.1 mg/dL (ref 4.4–7.6)

## 2016-09-15 LAB — ECHOCARDIOGRAM COMPLETE
Height: 72 in
Weight: 3134.4 oz

## 2016-09-15 LAB — GLUCOSE, CAPILLARY
GLUCOSE-CAPILLARY: 124 mg/dL — AB (ref 65–99)
Glucose-Capillary: 119 mg/dL — ABNORMAL HIGH (ref 65–99)

## 2016-09-15 LAB — BRAIN NATRIURETIC PEPTIDE: B Natriuretic Peptide: 402 pg/mL — ABNORMAL HIGH (ref 0.0–100.0)

## 2016-09-15 LAB — D-DIMER, QUANTITATIVE: D-Dimer, Quant: 0.66 ug/mL-FEU — ABNORMAL HIGH (ref 0.00–0.50)

## 2016-09-15 LAB — TROPONIN I: Troponin I: <0.03 ng/mL (ref <0.03)

## 2016-09-15 MED ORDER — ONDANSETRON HCL 4 MG/2ML IJ SOLN
4.0000 mg | Freq: Four times a day (QID) | INTRAMUSCULAR | Status: DC | PRN
Start: 2016-09-15 — End: 2016-09-16

## 2016-09-15 MED ORDER — ACETAMINOPHEN 650 MG RE SUPP: 650.0000 mg | Freq: Four times a day (QID) | RECTAL | Status: DC | PRN

## 2016-09-15 MED ORDER — HYDRALAZINE HCL 20 MG/ML IJ SOLN
5.0000 mg | Freq: Once | INTRAMUSCULAR | Status: AC
  Administered 2016-09-15: 5 mg via INTRAVENOUS
  Filled 2016-09-15: qty 1

## 2016-09-15 MED ORDER — NITROGLYCERIN 0.4 MG/SPRAY TL SOLN: 1.0000 | Status: DC | PRN

## 2016-09-15 MED ORDER — ATORVASTATIN CALCIUM 40 MG PO TABS
80.0000 mg | ORAL_TABLET | Freq: Every day | ORAL | Status: DC
  Administered 2016-09-15: 80 mg via ORAL
  Filled 2016-09-15: qty 2

## 2016-09-15 MED ORDER — FUROSEMIDE 10 MG/ML IJ SOLN
20.0000 mg | Freq: Once | INTRAMUSCULAR | Status: AC
  Administered 2016-09-15: 20 mg via INTRAVENOUS
  Filled 2016-09-15: qty 2

## 2016-09-15 MED ORDER — ACETAMINOPHEN 325 MG PO TABS
650.0000 mg | ORAL_TABLET | Freq: Four times a day (QID) | ORAL | Status: DC | PRN
  Administered 2016-09-15 (×2): 650 mg via ORAL
  Filled 2016-09-15 (×2): qty 2

## 2016-09-15 MED ORDER — ISOSORB DINITRATE-HYDRALAZINE 20-37.5 MG PO TABS
1.0000 | ORAL_TABLET | Freq: Three times a day (TID) | ORAL | Status: DC
  Administered 2016-09-15 – 2016-09-16 (×3): 1 via ORAL
  Filled 2016-09-15 (×6): qty 1

## 2016-09-15 MED ORDER — PANTOPRAZOLE SODIUM 40 MG PO TBEC
40.0000 mg | DELAYED_RELEASE_TABLET | Freq: Every day | ORAL | Status: DC
  Administered 2016-09-15 – 2016-09-16 (×2): 40 mg via ORAL
  Filled 2016-09-15 (×2): qty 1

## 2016-09-15 MED ORDER — ONDANSETRON HCL 4 MG PO TABS: 4.0000 mg | ORAL_TABLET | Freq: Four times a day (QID) | ORAL | Status: DC | PRN

## 2016-09-15 MED ORDER — ASPIRIN 81 MG PO CHEW
324.0000 mg | CHEWABLE_TABLET | Freq: Once | ORAL | Status: AC
  Administered 2016-09-15: 324 mg via ORAL
  Filled 2016-09-15: qty 4

## 2016-09-15 MED ORDER — POLYETHYLENE GLYCOL 3350 17 G PO PACK: 17.0000 g | PACK | Freq: Every day | ORAL | Status: DC | PRN

## 2016-09-15 MED ORDER — BISACODYL 5 MG PO TBEC: 5.0000 mg | DELAYED_RELEASE_TABLET | Freq: Every day | ORAL | Status: DC | PRN

## 2016-09-15 MED ORDER — NITROGLYCERIN 0.4 MG SL SUBL
0.4000 mg | SUBLINGUAL_TABLET | SUBLINGUAL | Status: AC
  Administered 2016-09-15: 0.4 mg via SUBLINGUAL
  Filled 2016-09-15: qty 1

## 2016-09-15 MED ORDER — TRAZODONE HCL 50 MG PO TABS
50.0000 mg | ORAL_TABLET | Freq: Once | ORAL | Status: AC
  Administered 2016-09-15: 50 mg via ORAL
  Filled 2016-09-15: qty 1

## 2016-09-15 MED ORDER — CLOPIDOGREL BISULFATE 75 MG PO TABS
75.0000 mg | ORAL_TABLET | Freq: Every day | ORAL | Status: DC
  Administered 2016-09-16: 75 mg via ORAL
  Filled 2016-09-15: qty 1

## 2016-09-15 NOTE — ED Provider Notes (Signed)
[facility] Provider Note   CSN: [id number] Arrival date & time: 09/15/16  0445     History   Chief Complaint Chief Complaint  Patient presents with  . Shortness of Breath    HPI [name] is a 71 y.o. male.  Patient with history of CAD with recent stent one month ago, recently diagnosed myelodysplastic syndrome, GERD, diabetes presenting with 3 day history of shortness of breath. He describes difficulty breathing that is exertional and unable to lay flat. States he is normally able to lay flat. He is becoming dyspneic with just taking a few steps. Denies cough or fever. Denies any leg pain or leg swelling. Denies any history of heart failure. Normally able to lay flat without a problem. No fever or chills. Denies any chest pain but does have some tightness with his breathing that is been constant for the past 3 days.   The history is provided by the patient.    Past Medical History:  Diagnosis Date  . Anxiety   . Arthritis    "knees" (08/12/2016)  . BPH (benign prostatic hyperplasia)   . Chronic lower back pain   . Depression    "when my dad passed ~ 2003"  . Gastritis   . GERD (gastroesophageal reflux disease)   . History of kidney stones   . Hypertension   . Kidney pain    "always got pain in my kidneys" (08/12/2016)  . Leukemia (HCC)    "don't know what kind" (08/12/2016)  . Pneumonia X 1  . Renal insufficiency   . Spleen enlarged   . Thrombocytosis (HCC)   . Type II diabetes mellitus (HCC)     Patient Active Problem List   Diagnosis Date Noted  . Essential hypertension 08/14/2016  . Headache 08/14/2016  . Hyperlipidemia LDL goal <70 08/14/2016  . Abnormal nuclear stress test   . 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HCC)   . Acute respiratory failure with hypoxia (HCC) 04/27/2016  . Hypoxia   . Hematoma 04/25/2016  . Mass of chest wall, left   . Abnormal partial thromboplastin time (PTT)   . 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HCC)   . Coronary artery  disease due to lipid rich plaque 04/15/2016  . Leukocytosis 04/15/2016  . Diabetes mellitus (HCC) 04/15/2016  . Chest pain 04/15/2016  . CKD (chronic kidney disease) stage 3, GFR 30-59 ml/min 04/15/2016  . Left shoulder pain 04/15/2016  . Encounter for screening colonoscopy 01/21/2013  . GERD (gastroesophageal reflux disease) 01/21/2013    Past Surgical History:  Procedure Laterality Date  . BACK SURGERY    . BONE MARROW BIOPSY     x 3 times  . CARDIAC CATHETERIZATION N/A 08/12/2016   Procedure: Left Heart Cath and Coronary Angiography;  Surgeon: Jayadeep S Varanasi, MD;  Location: [facility];  Service: Cardiovascular;  Laterality: N/A;  . CARDIAC CATHETERIZATION N/A 08/12/2016   Procedure: Coronary Stent Intervention;  Surgeon: Jayadeep S Varanasi, MD;  Location: [facility];  Service: Cardiovascular;  Laterality: N/A;  . CARDIAC CATHETERIZATION  2000s X 1  . COLONOSCOPY WITH PROPOFOL N/A 02/01/2013   SLF: 1. 23 colon polyps removed. 2 retrieved. 2. Mild diverticulosis in teh sigmoid colon 3. Small internal hemorrhoids 4. The colon is redundant   . CYSTOSCOPY W/ STONE MANIPULATION    . ESOPHAGOGASTRODUODENOSCOPY (EGD) WITH PROPOFOL N/A 02/01/2013   SLF: 1. Moderate erosive gastritis  . LUMBAR DISC SURGERY  1978  . POLYPECTOMY N/A 02/01/2013   Procedure: POLYPECTOMY;  Surgeon:   Danie Binder, MD;  Location: [facility];  Service: Endoscopy;  Laterality: N/A;       Home Medications    Prior to Admission medications   Medication Sig Start Date End Date Taking? Authorizing Provider  acetaminophen (TYLENOL) 500 MG tablet Take 1,000 mg by mouth every 6 (six) hours as needed for mild pain or moderate pain.    Yes Historical Provider, MD  allopurinol (ZYLOPRIM) 300 MG tablet Take 300 mg by mouth daily.   Yes Historical Provider, MD  aspirin EC 81 MG tablet Take 1 tablet (81 mg total) by mouth daily. 08/04/16  Yes Arnoldo Lenis, MD  atorvastatin (LIPITOR) 80 MG tablet Take 1  tablet (80 mg total) by mouth daily at 6 PM. 08/14/16  Yes Bhavinkumar Bhagat, PA  clopidogrel (PLAVIX) 75 MG tablet Take 1 tablet (75 mg total) by mouth daily with breakfast. 08/14/16  Yes Bhavinkumar Bhagat, PA  cyclobenzaprine (FLEXERIL) 5 MG tablet Take 1 tablet (5 mg total) by mouth 3 (three) times daily as needed for muscle spasms. 08/08/15  Yes Evalee Jefferson, PA-C  DULoxetine (CYMBALTA) 60 MG capsule Take 60 mg by mouth daily.  04/09/16  Yes Historical Provider, MD  feeding supplement, ENSURE ENLIVE, (ENSURE ENLIVE) LIQD Take 237 mLs by mouth 2 (two) times daily between meals. 05/04/16  Yes Hosie Poisson, MD  hydrALAZINE (APRESOLINE) 25 MG tablet Take 1 tablet (25 mg total) by mouth every 8 (eight) hours. 08/14/16  Yes Bhavinkumar Bhagat, PA  HYDROcodone-acetaminophen (NORCO/VICODIN) 5-325 MG tablet Take 1 tablet by mouth every 8 (eight) hours as needed for pain. 07/23/16  Yes Historical Provider, MD  lisinopril (PRINIVIL,ZESTRIL) 40 MG tablet Take 1 tablet (40 mg total) by mouth daily. 08/14/16  Yes Bhavinkumar Bhagat, PA  metFORMIN (GLUCOPHAGE) 500 MG tablet Take 500 mg by mouth daily.   Yes Historical Provider, MD  metoprolol succinate (TOPROL-XL) 50 MG 24 hr tablet TAKE 1.5 TABLETS DAILY 09/04/16  Yes Arnoldo Lenis, MD  nitroGLYCERIN (NITROSTAT) 0.4 MG SL tablet Place 1 tablet (0.4 mg total) under the tongue every 5 (five) minutes as needed for chest pain. 09/04/16 12/03/16 Yes Arnoldo Lenis, MD  pantoprazole (PROTONIX) 40 MG tablet Take 1 tablet (40 mg total) by mouth daily. 08/14/16  Yes Bhavinkumar Bhagat, PA  ruxolitinib phosphate (JAKAFI) 20 MG tablet Take 20 mg by mouth 2 (two) times daily.   Yes Historical Provider, MD  tiZANidine (ZANAFLEX) 4 MG capsule Take 4 mg by mouth 2 (two) times daily.   Yes Historical Provider, MD  traZODone (DESYREL) 50 MG tablet Take 50 mg by mouth at bedtime as needed for sleep. 07/18/16  Yes Historical Provider, MD    Family History Family History  Problem  Relation Age of Onset  . Heart attack Mother   . CVA Mother   . Heart attack Father   . Colon cancer Neg Hx     Social History Social History  Substance Use Topics  . Smoking status: Former Smoker    Packs/day: 1.00    Years: 20.00    Types: Cigars    Start date: 06/25/1975    Quit date: 08/04/2013  . Smokeless tobacco: Never Used  . Alcohol use No     Comment: Remote history of ETOH abuse, "when I was young and dumb"      Allergies   Hydrocodone   Review of Systems Review of Systems  Constitutional: Negative for activity change, appetite change and fever.  HENT: Negative for congestion.  Respiratory: Positive for chest tightness and shortness of breath. Negative for cough.   Cardiovascular: Positive for chest pain. Negative for leg swelling.  Gastrointestinal: Negative for abdominal pain, nausea and vomiting.  Genitourinary: Negative for dysuria and hematuria.  Musculoskeletal: Negative for arthralgias, back pain and myalgias.  Neurological: Negative for dizziness, weakness and headaches.   A complete 10 system review of systems was obtained and all systems are negative except as noted in the HPI and PMH.    Physical Exam Updated Vital Signs Ht 6' (1.829 m)   Wt 192 lb (87.1 kg)   BMI 26.04 kg/m   Physical Exam  Constitutional: He is oriented to person, place, and time. He appears well-developed and well-nourished. No distress.  HENT:  Head: Normocephalic and atraumatic.  Mouth/Throat: Oropharynx is clear and moist. No oropharyngeal exudate.  Eyes: Conjunctivae and EOM are normal. Pupils are equal, round, and reactive to light.  Neck: Normal range of motion. Neck supple.  No meningismus.  Cardiovascular: Normal rate, regular rhythm, normal heart sounds and intact distal pulses.   No murmur heard. Pulmonary/Chest: Effort normal and breath sounds normal. No respiratory distress.  Decreased breath sounds at the bases  Abdominal: Soft. There is no tenderness.  There is no rebound and no guarding.  Musculoskeletal: Normal range of motion. He exhibits no tenderness.  Trace pedal edema bilaterally  Neurological: He is alert and oriented to person, place, and time. No cranial nerve deficit. He exhibits normal muscle tone. Coordination normal.  No ataxia on finger to nose bilaterally. No pronator drift. 5/5 strength throughout. CN 2-12 intact.Equal grip strength. Sensation intact.   Skin: Skin is warm.  Psychiatric: He has a normal mood and affect. His behavior is normal.  Nursing note and vitals reviewed.    ED Treatments / Results  Labs (all labs ordered are listed, but only abnormal results are displayed) Labs Reviewed  CBC WITH DIFFERENTIAL/PLATELET - Abnormal; Notable for the following:       Result Value   WBC 24.2 (*)    RDW 21.8 (*)    Platelets 437 (*)    Neutro Abs 10.9 (*)    Lymphs Abs 10.4 (*)    Monocytes Absolute 1.7 (*)    Basophils Absolute 0.7 (*)    All other components within normal limits  BASIC METABOLIC PANEL - Abnormal; Notable for the following:    Glucose, Bld 128 (*)    BUN 28 (*)    Creatinine, Ser 1.96 (*)    Calcium 8.3 (*)    GFR calc non Af Amer 33 (*)    GFR calc Af Amer 38 (*)    All other components within normal limits  BRAIN NATRIURETIC PEPTIDE - Abnormal; Notable for the following:    B Natriuretic Peptide 402.0 (*)    All other components within normal limits  D-DIMER, QUANTITATIVE (NOT AT [facility]) - Abnormal; Notable for the following:    D-Dimer, Quant 0.66 (*)    All other components within normal limits  TROPONIN I    EKG  EKG Interpretation  Date/Time:  Monday September 15 2016 04:57:14 EST Ventricular Rate:  62 PR Interval:    QRS Duration: 90 QT Interval:  448 QTC Calculation: 455 R Axis:   27 Text Interpretation:  Sinus rhythm T waves now upright  lateral ST depression Confirmed by Wyvonnia Dusky  MD, Cathryne Mancebo [phone]) on 09/15/2016 5:09:57 AM       Radiology Dg Chest 2 View  Result  Date: 09/15/2016 CLINICAL DATA:  Acute onset of shortness of breath. Initial encounter. EXAM: CHEST  2 VIEW COMPARISON:  Chest radiograph performed 04/27/2016 FINDINGS: The lungs are mildly hypoexpanded. Bibasilar airspace opacities may reflect mild interstitial edema. Vascular congestion is noted. Small bilateral pleural effusions are seen. There is no evidence of pneumothorax. The heart is normal in size; the mediastinal contour is within normal limits. No acute osseous abnormalities are seen. IMPRESSION: Lungs mildly hypoexpanded. Bibasilar airspace opacities may reflect mild interstitial edema. Vascular congestion noted. Small bilateral pleural effusions seen. Electronically Signed   By: Garald Balding M.D.   On: 09/15/2016 06:22    Procedures Procedures (including critical care time)  Medications Ordered in ED Medications - No data to display   Initial Impression / Assessment and Plan / ED Course  I have reviewed the triage vital signs and the nursing notes.  Pertinent labs & imaging results that were available during my care of the patient were reviewed by me and considered in my medical decision making (see chart for details).    3 days of dyspnea with exertion, orthopnea, PND. Recent catheterization below. EKG shows T waves now upright with minimal depression laterally. Denies chest pain.   Cath Jan 2018.  Dist RCA lesion, 60 %stenosed.  3rd Mrg lesion, 90 %stenosed. This was a small vessel.  Mid LAD lesion, 40 %stenosed.  Ost LAD lesion, 25 %stenosed.  Mid Cx lesion, 95 %stenosed. This was the culprit lesion. A STENT SYNERGY DES 3X20 drug eluting stent was successfully placed., postdilated to 3.5  Post intervention, there is a 0% residual stenosis.  LV end diastolic pressure is normal.  There is no aortic valve stenosis.  Significant tortuosity in the right subclavian. If emergency cath was needed in the future, would not use right radial approach.   Mild interstitial  edema on CXR. Troponin negative.  Age adjusted D-dimer negative.   BNP 400. Patient with some dyspnea with attempted exertion but no hypoxia. Seems to have new onset heart failure. Question whether this is related to his new chemotherapy. Patient given IV Lasix. Cr 1.9 from 1.5.  Given his recent diagnosis of myelodysplastic syndrome as well as recent stent, will admit for observation. Discussed with Dr. Adair Patter.    Final Clinical Impressions(s) / ED Diagnoses   Final diagnoses:  Acute on chronic systolic congestive heart failure [facility])    New Prescriptions New Prescriptions   No medications on file     Ezequiel Essex, MD 09/15/16 [phone]

## 2016-09-15 NOTE — Progress Notes (Signed)
PT Cancellation Note  Patient Details Name: [name] MRN: [id number] DOB: 01/30/1946   Cancelled Treatment:    Attempted to evaluate patient, however patient refuses this afternoon, stating, "I'm just not feeling good, I'm weak when I stand/walk". Educated patient regarding role of PT in acute care and importance of working with PT in reducing his reported concerns, however unable to convince patient to participate in evaluation today.   Will attempt evaluation on 09/16/16.    Deniece Ree PT, DPT [phone]

## 2016-09-15 NOTE — ED Triage Notes (Signed)
Pt c/o sob and chest pressure that started a few days ago, denies any n/v, diaphoresis , pt reports that he had "stents" placed a month ago at [facility] ,

## 2016-09-15 NOTE — Consult Note (Signed)
CARDIOLOGY CONSULT NOTE   Patient ID: [name] MRN: [id number] DOB/AGE: [birth year] 72 y.o.  Admit Date: 09/15/2016 Referring Physician: Juleen Starr, Webb Primary Physician: Alan Blitz, Webb Consulting Cardiologist: [name] Primary Cardiologist: [name] Reason for Consultation: Possible CHF with recent PCI 08/14/2016  Clinical Summary [name] is a 71 y.o.male with past medical history outlined below, status post recent DES to the circumflex in late January following an abnormal Myoview study that had been done at [facility]. He had residual LAD and RCA disease as well as a small third obtuse marginal branch with tighter stenosis that were managed medically. He also has myelodysplastic syndrome that is followed at  [facility], recently started on Ruxolitinib [name]). He saw Dr. Harl Webb in the office recently on February 15 in follow-up of intervention, reporting atypical, sharp chest discomfort at that time. He came into the ER describing worsening symptoms over the last week including orthopnea and PND, pleuritic chest discomfort with intermittent cough, no hemoptysis. No reported fevers or chills.  CXR in the ER demonstrates bibasilar airspace opacities which may reflect interstitial edema, and vascular congestion, with small bilateral pleural effusions. BNP elevated at 402, initial troponin I negative.  He questions whether symptoms are related to recent chemotherapy agent, although not certain there was a definite correlation with timing. Has had limited appetite and generalized weakness, otherwise symptoms as outlined above. No obvious correlation to cardiomyopathy based on review of potential side effects. He has not had a recent echocardiogram.   Allergies  Allergen Reactions  . Hydrocodone Itching    Home Medications No current facility-administered medications on file prior to encounter.    Current Outpatient Prescriptions on File Prior to Encounter    Medication Sig Dispense Refill  . acetaminophen (TYLENOL) 500 MG tablet Take 1,000 mg by mouth every 6 (six) hours as needed for mild pain or moderate pain.     Marland Kitchen aspirin EC 81 MG tablet Take 1 tablet (81 mg total) by mouth daily. 90 tablet 3  . atorvastatin (LIPITOR) 80 MG tablet Take 1 tablet (80 mg total) by mouth daily at 6 PM. 30 tablet 6  . clopidogrel (PLAVIX) 75 MG tablet Take 1 tablet (75 mg total) by mouth daily with breakfast. 30 tablet 11  . cyclobenzaprine (FLEXERIL) 5 MG tablet Take 1 tablet (5 mg total) by mouth 3 (three) times daily as needed for muscle spasms. 20 tablet 0  . DULoxetine (CYMBALTA) 60 MG capsule Take 60 mg by mouth daily.     . feeding supplement, ENSURE ENLIVE, (ENSURE ENLIVE) LIQD Take 237 mLs by mouth 2 (two) times daily between meals. 237 mL 12  . hydrALAZINE (APRESOLINE) 25 MG tablet Take 1 tablet (25 mg total) by mouth every 8 (eight) hours. 90 tablet 6  . HYDROcodone-acetaminophen (NORCO/VICODIN) 5-325 MG tablet Take 1 tablet by mouth every 8 (eight) hours as needed for pain.    . metFORMIN (GLUCOPHAGE) 500 MG tablet Take 500 mg by mouth daily.    . metoprolol succinate (TOPROL-XL) 50 MG 24 hr tablet TAKE 1.5 TABLETS DAILY 135 tablet 3  . nitroGLYCERIN (NITROSTAT) 0.4 MG SL tablet Place 1 tablet (0.4 mg total) under the tongue every 5 (five) minutes as needed for chest pain. 25 tablet 3  . pantoprazole (PROTONIX) 40 MG tablet Take 1 tablet (40 mg total) by mouth daily. 30 tablet 6  . tiZANidine (ZANAFLEX) 4 MG capsule Take 4 mg by mouth 2 (two) times daily.    . traZODone (  DESYREL) 50 MG tablet Take 50 mg by mouth at bedtime as needed for sleep.       Past Medical History:  Diagnosis Date  . Anxiety   . Arthritis   . BPH (benign prostatic hyperplasia)   . CAD (coronary artery disease)    DES to circumflex 07/2016, moderate residual LAD and RCA, small 90% OM3 - managed medically  . Chronic lower back pain   . Depression   . Essential hypertension    . Gastritis   . GERD (gastroesophageal reflux disease)   . History of kidney stones   . 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[place])    Likely MDS/MPN, unclassifiable - folowed at [facility]  . Pneumonia X 1  . Renal insufficiency   . Spleen enlarged   . Type II diabetes mellitus ([place])     Past Surgical History:  Procedure Laterality Date  . BACK SURGERY    . BONE MARROW BIOPSY     x 3 times  . CARDIAC CATHETERIZATION N/A 08/12/2016   Procedure: Left Heart Cath and Coronary Angiography;  Surgeon: Alan Booze, Webb;  Location: [place] [facility];  Service: Cardiovascular;  Laterality: N/A;  . CARDIAC CATHETERIZATION N/A 08/12/2016   Procedure: Coronary Stent Intervention;  Surgeon: Alan Booze, Webb;  Location: [place] [facility];  Service: Cardiovascular;  Laterality: N/A;  . CARDIAC CATHETERIZATION  2000s X 1  . COLONOSCOPY WITH PROPOFOL N/A 02/01/2013   SLF: 1. 23 colon polyps removed. 2 retrieved. 2. Mild diverticulosis in teh sigmoid colon 3. Small internal hemorrhoids 4. The colon is redundant   . CYSTOSCOPY W/ STONE MANIPULATION    . ESOPHAGOGASTRODUODENOSCOPY (EGD) WITH PROPOFOL N/A 02/01/2013   SLF: 1. Moderate erosive gastritis  . [place]  . POLYPECTOMY N/A 02/01/2013   Procedure: POLYPECTOMY;  Surgeon: Alan Binder, Webb;  Location: [facility];  Service: Endoscopy;  Laterality: N/A;    Family History  Problem Relation Age of Onset  . Heart attack Mother   . CVA Mother   . Heart attack Father   . Colon cancer Neg Hx     Social History [name] reports that he quit smoking about 3 years ago. His smoking use included Cigars. He started smoking about 41 years ago. He has a 20.00 pack-year smoking history. He has never used smokeless tobacco. [name] reports that he does not drink alcohol.  Review of Systems Complete review of systems are found to be negative unless outlined in H&P above. Limited appetite, intermittent cough, no fevers or chills. Orthopnea  and PND.  Physical Examination Blood pressure 179/83, pulse 60, temperature 97.6 F (36.4 C), temperature source Oral, resp. rate 23, height 6' (1.829 m), weight 192 lb (87.1 kg), SpO2 96 %. No intake or output data in the 24 hours ending 09/15/16 1209  Telemetry: NSR rates in the 60's   GEN: Mildly labored breathing at rest. No obvious distress.Marland Kitchen  HEENT: Conjunctiva and lids normal, oropharynx clear. Neck: Supple, mildly elevated JVP, no carotid bruits, no thyromegaly. Lungs: Bilateral crackles without wheezes or coughing. Soreness on inspiration.  Cardiac: Regular rate and rhythm, no S3, no pericardial rub. Abdomen: Soft, nontender, bowel sounds present, no guarding or rebound. Extremities: No pitting edema, distal pulses 2+. Skin: Warm and dry. Musculoskeletal: No kyphosis. Neuropsychiatric: Alert and oriented x3, affect grossly appropriate.  Prior Cardiac Testing/Procedures Cardiac Cath 08/14/2016 Coronary Stent Intervention  08/12/16  Left Heart Cath and Coronary Angiography  Conclusion     Dist  RCA lesion, 60 %stenosed.  3rd Mrg lesion, 90 %stenosed. This was a small vessel.  Mid LAD lesion, 40 %stenosed.  Ost LAD lesion, 25 %stenosed.  Mid Cx lesion, 95 %stenosed. This was the culprit lesion. A STENT SYNERGY DES 3X20 drug eluting stent was successfully placed., postdilated to 3.5  Post intervention, there is a 0% residual stenosis.  LV end diastolic pressure is normal.  There is no aortic valve stenosis.  Significant tortuosity in the right subclavian. If emergency cath was needed in the future, would not use right radial approach.  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 A Synergy stent was used to give him the benefit of a drug-eluting stent but also allow for shortening of antiplatelet therapy duration. For now, continue dual antiplatelet therapy. His platelet count is normal. I will speak to Dr. Harl Webb regarding this issue.    Left Heart    Left Ventricle LV end diastolic pressure is normal.    Aortic Valve There is no aortic valve stenosis.      Lab Results  Basic Metabolic Panel:  Recent Labs Lab 09/15/16 0543  NA 141  K 4.0  CL 110  CO2 26  GLUCOSE 128*  BUN 28*  CREATININE 1.96*  CALCIUM 8.3*    CBC:  Recent Labs Lab 09/15/16 0543  WBC 24.2*  NEUTROABS 10.9*  HGB 13.6  HCT 40.2  MCV 88.4  PLT 437*    Cardiac Enzymes:  Recent Labs Lab 09/15/16 0543  TROPONINI <0.03    BNP: 402   Radiology: Dg Chest 2 View  Result Date: 09/15/2016 CLINICAL DATA:  Acute onset of shortness of breath. Initial encounter. EXAM: CHEST  2 VIEW COMPARISON:  Chest radiograph performed 04/27/2016 FINDINGS: The lungs are mildly hypoexpanded. Bibasilar airspace opacities may reflect mild interstitial edema. Vascular congestion is noted. Small bilateral pleural effusions are seen. There is no evidence of pneumothorax. The heart is normal in size; the mediastinal contour is within normal limits. No acute osseous abnormalities are seen. IMPRESSION: Lungs mildly hypoexpanded. Bibasilar airspace opacities may reflect mild interstitial edema. Vascular congestion noted. Small bilateral pleural effusions seen. Electronically Signed   By: [name] M.D.   On: 09/15/2016 06:22     ECG:  I personally reviewed the tracing from 09/15/2016 which shows sinus rhythm with nonspecific ST-T changes.  Impression and Recommendations  1. Shortness of breath with evidence of pleural effusions and possible pulmonary infiltrates versus edema:  Patient has also had atypical, somewhat pleuritic chest discomfort and initial troponin I is negative arguing against ACS. ECG nonspecific. Question of heart failure and/or cardiomyopathy remains. Recommend echocardiogram.  2. CAD: Underwent DES to the circumflex on January 25, otherwise with moderate residual disease within the RCA and LAD, and a tight third obtuse marginal stenosis within  small branch that was best managed medically. Would continue metoprolol, ASA, Plavix. With worsening kidney function, will d/c ACE for now and consider increasing dose of hydralazine. Add isosorbide 30 mg daily.   4. AKI: Holding ACE pending further workup per primary team.   5. Hypertension: Add isosorbide and increase dose of hydralazine.  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 Cardiomyopathy not specifically associated with this medication based on brief review.  Signed: Phill Webb. Lawrence NP [place]  09/15/2016, 12:09 PM Co-Sign Webb     Attending note:  Patient seen and examined. Reviewed records and modified above note by Ms. Lawrence NP. [name] is status post recent DES to the circumflex  in late January following an abnormal Myoview at [place]. Residual disease includes moderate stenoses within the LAD and RCA, tighter stenosis within a small obtuse marginal branch that were managed medically. He saw Dr. Harl Webb in the office recently, was reporting some sharp, atypical chest pains. Patient presents reporting worsening symptoms over the last week, mainly shortness of breath and orthopnea, intermittent coughing, no fevers or chills. Has also had limited appetite. He has myelodysplastic syndrome followed at the [facility], recently started on Ruxolitinib (no obvious mention of cardiomyopathy in review of potential side effects).  On assessment in the ER he does have increased work of breathing, elevated blood pressure, chest x-ray showing small pleural effusions as well as potential infiltrates versus edema. Initial troponin I is negative. BNP elevated at 402. ECG without acute ST segment changes. Lungs exhibit decreased breath sounds with crackles at the bases.  Patient being admitted to the hospitalist team for further evaluation, heart failure symptoms suspected although no prior history of cardiomyopathy. Echocardiogram will need to be obtained for  further evaluation of cardiac structure and function. At this point doubt ACS despite recent intervention with negative troponin I level. He also has renal insufficiency, agree with holding ACE inhibitor for now. Primary team also interfacing with patient's oncologist to discuss his symptoms and recent treatment.  Satira Sark, M.D., F.A.C.C.

## 2016-09-15 NOTE — Progress Notes (Signed)
Patient's family requested the results of his Echocardiogram.  I told them that the doctor has to tell the results and paged Dr. Adair Patter asking if she would speak with them and the patient.

## 2016-09-15 NOTE — H&P (Signed)
History and Physical    [name] EXH:[id number] DOB: 03-25-1946 DOA: 09/15/2016  PCP: [name], MD  Patient coming from: Home  Chief Complaint: dyspnea  HPI: [name] is a 71 y.o. male with medical history significant of recent cardiac cath with stent placement (Jan 2018), HLD, HTN, GERD, DM Type II that presents for shortness of breath.   Patient daughter reports that last week she took him to [facility] for chemotherapy and was told at that time his kidney function was normal.  She states hew as placed on allopurinol to protect the kidneys from chemotherapy.  Has been on chemotherapy since 2/20. Two days ago patient reports that he began feeling pressure in his chest and he felt like he couldn't breath.  He mentions as time went on his shortness of breath worsened andhe decided to come in.  Dyspnea occurring even at rest.  Orthopnea last night.  Says that laying down even at an incline he had issues catching his breath.  Normally he lays down flat to sleep.  Denies swelling in his legs, no claudication.  No cough, no fevers or chills.  Pain with inspiration that is occasional but happening for the past two days.  Was supposed to follow up with cardiology within 2 months. See's Dr. Harl Bowie here in [place]. Oncologist is Dr. Florene Glen at [facility]. Eating and drinking normally for the past two days but has had less of an appetitie.   ED Course: Seen by EDP.  Oxygen saturations WNL, troponin negative, BNP slightly elevated, Cr of 1.91. Given 1 dose of IV lasix with minimal diuresis.  Found to be hypertensive.  TRH asked to admit for evaluation of possible new heart failure.  Review of Systems: As per HPI otherwise 10 point review of systems negative.    Past Medical History:  Diagnosis Date  . Anxiety   . Arthritis    "knees" (08/12/2016)  . BPH (benign prostatic hyperplasia)   . Chronic lower back pain   . Depression    "when my dad passed ~ 2003"  . Gastritis   . GERD  (gastroesophageal reflux disease)   . History of kidney stones   . Hypertension   . Kidney pain    "always got pain in my kidneys" (08/12/2016)  . Leukemia ([place])    "don't know what kind" (08/12/2016)  . Pneumonia X 1  . Renal insufficiency   . Spleen enlarged   . Thrombocytosis ([place])   . Type II diabetes mellitus ([place])     Past Surgical History:  Procedure Laterality Date  . BACK SURGERY    . BONE MARROW BIOPSY     x 3 times  . CARDIAC CATHETERIZATION N/A 08/12/2016   Procedure: Left Heart Cath and Coronary Angiography;  Surgeon: Jettie Booze, MD;  Location: [place] [facility];  Service: Cardiovascular;  Laterality: N/A;  . CARDIAC CATHETERIZATION N/A 08/12/2016   Procedure: Coronary Stent Intervention;  Surgeon: Jettie Booze, MD;  Location: [place] [facility];  Service: Cardiovascular;  Laterality: N/A;  . CARDIAC CATHETERIZATION  2000s X 1  . COLONOSCOPY WITH PROPOFOL N/A 02/01/2013   SLF: 1. 23 colon polyps removed. 2 retrieved. 2. Mild diverticulosis in teh sigmoid colon 3. Small internal hemorrhoids 4. The colon is redundant   . CYSTOSCOPY W/ STONE MANIPULATION    . ESOPHAGOGASTRODUODENOSCOPY (EGD) WITH PROPOFOL N/A 02/01/2013   SLF: 1. Moderate erosive gastritis  . [place]  . POLYPECTOMY N/A 02/01/2013  Procedure: POLYPECTOMY;  Surgeon: Danie Binder, MD;  Location: [facility];  Service: Endoscopy;  Laterality: N/A;     reports that he quit smoking about 3 years ago. His smoking use included Cigars. He started smoking about 41 years ago. He has a 20.00 pack-year smoking history. He has never used smokeless tobacco. He reports that he does not drink alcohol or use drugs.  Allergies  Allergen Reactions  . Hydrocodone Itching    Family History  Problem Relation Age of Onset  . Heart attack Mother   . CVA Mother   . Heart attack Father   . Colon cancer Neg Hx      Prior to Admission medications   Medication Sig Start Date End Date Taking?  Authorizing Provider  acetaminophen (TYLENOL) 500 MG tablet Take 1,000 mg by mouth every 6 (six) hours as needed for mild pain or moderate pain.    Yes Historical Provider, MD  allopurinol (ZYLOPRIM) 300 MG tablet Take 300 mg by mouth daily.   Yes Historical Provider, MD  aspirin EC 81 MG tablet Take 1 tablet (81 mg total) by mouth daily. 08/04/16  Yes Arnoldo Lenis, MD  atorvastatin (LIPITOR) 80 MG tablet Take 1 tablet (80 mg total) by mouth daily at 6 PM. 08/14/16  Yes Bhavinkumar Bhagat, PA  clopidogrel (PLAVIX) 75 MG tablet Take 1 tablet (75 mg total) by mouth daily with breakfast. 08/14/16  Yes Bhavinkumar Bhagat, PA  cyclobenzaprine (FLEXERIL) 5 MG tablet Take 1 tablet (5 mg total) by mouth 3 (three) times daily as needed for muscle spasms. 08/08/15  Yes Evalee Jefferson, PA-C  DULoxetine (CYMBALTA) 60 MG capsule Take 60 mg by mouth daily.  04/09/16  Yes Historical Provider, MD  feeding supplement, ENSURE ENLIVE, (ENSURE ENLIVE) LIQD Take 237 mLs by mouth 2 (two) times daily between meals. 05/04/16  Yes Hosie Poisson, MD  hydrALAZINE (APRESOLINE) 25 MG tablet Take 1 tablet (25 mg total) by mouth every 8 (eight) hours. 08/14/16  Yes Bhavinkumar Bhagat, PA  HYDROcodone-acetaminophen (NORCO/VICODIN) 5-325 MG tablet Take 1 tablet by mouth every 8 (eight) hours as needed for pain. 07/23/16  Yes Historical Provider, MD  lisinopril (PRINIVIL,ZESTRIL) 40 MG tablet Take 1 tablet (40 mg total) by mouth daily. 08/14/16  Yes Bhavinkumar Bhagat, PA  metFORMIN (GLUCOPHAGE) 500 MG tablet Take 500 mg by mouth daily.   Yes Historical Provider, MD  metoprolol succinate (TOPROL-XL) 50 MG 24 hr tablet TAKE 1.5 TABLETS DAILY 09/04/16  Yes Arnoldo Lenis, MD  nitroGLYCERIN (NITROSTAT) 0.4 MG SL tablet Place 1 tablet (0.4 mg total) under the tongue every 5 (five) minutes as needed for chest pain. 09/04/16 12/03/16 Yes Arnoldo Lenis, MD  pantoprazole (PROTONIX) 40 MG tablet Take 1 tablet (40 mg total) by mouth daily. 08/14/16   Yes Bhavinkumar Bhagat, PA  ruxolitinib phosphate (JAKAFI) 20 MG tablet Take 20 mg by mouth 2 (two) times daily.   Yes Historical Provider, MD  tiZANidine (ZANAFLEX) 4 MG capsule Take 4 mg by mouth 2 (two) times daily.   Yes Historical Provider, MD  traZODone (DESYREL) 50 MG tablet Take 50 mg by mouth at bedtime as needed for sleep. 07/18/16  Yes Historical Provider, MD    Physical Exam: Vitals:   09/15/16 0800 09/15/16 0830 09/15/16 0900 09/15/16 0930  BP: 191/90 186/92 189/86 188/85  Pulse: 66 68 63 62  Resp: '20  20 15  '$ Temp:      TempSrc:      SpO2: 96% 95% 95%  95%  Weight:      Height:          Constitutional: NAD, calm, comfortable Vitals:   09/15/16 0800 09/15/16 0830 09/15/16 0900 09/15/16 0930  BP: 191/90 186/92 189/86 188/85  Pulse: 66 68 63 62  Resp: '20  20 15  '$ Temp:      TempSrc:      SpO2: 96% 95% 95% 95%  Weight:      Height:       Eyes: PERRL, lids and conjunctivae normal ENMT: Mucous membranes are moist. Posterior pharynx clear of any exudate or lesions.Normal dentition.  Neck: normal, supple, no masses, no thyromegaly Respiratory: bibasilar crackles. Normal respiratory effort. No accessory muscle use.  Cardiovascular: Regular rate and rhythm, no murmurs / rubs / gallops. No extremity edema. 2+ pedal pulses. No carotid bruits.  Abdomen: no tenderness, no masses palpated. No hepatosplenomegaly. Bowel sounds positive.  Musculoskeletal: no clubbing / cyanosis. No joint deformity upper and lower extremities. Good ROM, no contractures. Normal muscle tone.  Skin: no rashes, lesions, ulcers. No induration Neurologic: CN 2-12 grossly intact. Sensation intact, DTR normal. Strength 5/5 in all 4.  Psychiatric: Normal judgment and insight. Alert and oriented x 3. Normal mood.     Labs on Admission: I have personally reviewed following labs and imaging studies  CBC:  Recent Labs Lab 09/15/16 0543  WBC 24.2*  NEUTROABS 10.9*  HGB 13.6  HCT 40.2  MCV 88.4    PLT 073*   Basic Metabolic Panel:  Recent Labs Lab 09/15/16 0543  NA 141  K 4.0  CL 110  CO2 26  GLUCOSE 128*  BUN 28*  CREATININE 1.96*  CALCIUM 8.3*   GFR: Estimated Creatinine Clearance: 37.9 mL/min (by C-G formula based on SCr of 1.96 mg/dL (H)). Liver Function Tests: No results for input(s): AST, ALT, ALKPHOS, BILITOT, PROT, ALBUMIN in the last 168 hours. No results for input(s): LIPASE, AMYLASE in the last 168 hours. No results for input(s): AMMONIA in the last 168 hours. Coagulation Profile: No results for input(s): INR, PROTIME in the last 168 hours. Cardiac Enzymes:  Recent Labs Lab 09/15/16 0543  TROPONINI <0.03   BNP (last 3 results) No results for input(s): PROBNP in the last 8760 hours. HbA1C: No results for input(s): HGBA1C in the last 72 hours. CBG: No results for input(s): GLUCAP in the last 168 hours. Lipid Profile: No results for input(s): CHOL, HDL, LDLCALC, TRIG, CHOLHDL, LDLDIRECT in the last 72 hours. Thyroid Function Tests: No results for input(s): TSH, T4TOTAL, FREET4, T3FREE, THYROIDAB in the last 72 hours. Anemia Panel: No results for input(s): VITAMINB12, FOLATE, FERRITIN, TIBC, IRON, RETICCTPCT in the last 72 hours. Urine analysis:    Component Value Date/Time   COLORURINE YELLOW 09/15/2016 [place] 09/15/2016 0824   LABSPEC 1.010 09/15/2016 0824   PHURINE 6.0 09/15/2016 0824   GLUCOSEU NEGATIVE 09/15/2016 0824   HGBUR NEGATIVE 09/15/2016 0824   BILIRUBINUR NEGATIVE 09/15/2016 0824   KETONESUR NEGATIVE 09/15/2016 0824   PROTEINUR >=300 (A) 09/15/2016 0824   UROBILINOGEN 0.2 03/26/2008 1920   NITRITE NEGATIVE 09/15/2016 0824   LEUKOCYTESUR NEGATIVE 09/15/2016 0824   Sepsis Labs: !!!!!!!!!!!!!!!!!!!!!!!!!!!!!!!!!!!!!!!!!!!! '@LABRCNTIP'$ (procalcitonin:4,lacticidven:4) )No results found for this or any previous visit (from the past 240 hour(s)).   Radiological Exams on Admission: Dg Chest 2 View  Result Date:  09/15/2016 CLINICAL DATA:  Acute onset of shortness of breath. Initial encounter. EXAM: CHEST  2 VIEW COMPARISON:  Chest radiograph performed 04/27/2016 FINDINGS: The lungs are  mildly hypoexpanded. Bibasilar airspace opacities may reflect mild interstitial edema. Vascular congestion is noted. Small bilateral pleural effusions are seen. There is no evidence of pneumothorax. The heart is normal in size; the mediastinal contour is within normal limits. No acute osseous abnormalities are seen. IMPRESSION: Lungs mildly hypoexpanded. Bibasilar airspace opacities may reflect mild interstitial edema. Vascular congestion noted. Small bilateral pleural effusions seen. Electronically Signed   By: Garald Balding M.D.   On: 09/15/2016 06:22    EKG: Independently reviewed. Sinus rhythm, T waves now upright, lateral ST depression  Assessment/Plan Principal Problem:   Acute CHF (congestive heart failure) (HCC) Active Problems:   GERD (gastroesophageal reflux disease)   Coronary artery disease due to lipid rich plaque   Diabetes mellitus (HCC)   CKD (chronic kidney disease) stage 3, GFR 30-59 ml/min   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HCC)   Hyperlipidemia LDL goal <70     Chest discomfort/ Acute CHF - will order Echocardiogram - patient given a dose of lasix in ED but did not diurese much - Cardiology consulted - recent h/o cardiac cath with stent placement and on antiplatelet currently - trend troponins  AKI on CKD Stage 3 - will order UA and Renal ultrasound to evaluate for stone - most beneficial would be CT stone protocol but with patient's creatinine hesistant to perform CT with dye but this may be done to exclude stone   DM - hold metformin - CBG's - will start SSI if needed  Myeloproliferative dz - will call Dr. Florene Glen at [facility] to discuss chemotherapy agent - increase in WBC and Plt since last check  HLD - continue statin  GERD - PRN zantac  HTN - may need PRN IV  hydralazine   DVT prophylaxis: Plavix   Code Status: Full code Family Communication:  Wife and daughter are bedside Disposition Plan: When dyspnea improves Consults called: Cardiology consulted  Admission status: Inpatient, Telemetry   Loretha Stapler MD [facility] Pager [phone]  If 7PM-7AM, please contact night-coverage [email or web address] Password [email or web address]  09/15/2016, 10:10 AM

## 2016-09-15 NOTE — ED Notes (Signed)
Cardiology consult at bedside.

## 2016-09-15 NOTE — Progress Notes (Signed)
*  PRELIMINARY RESULTS* Echocardiogram 2D Echocardiogram has been performed.  Leavy Cella 09/15/2016, 2:32 PM

## 2016-09-16 ENCOUNTER — Inpatient Hospital Stay (HOSPITAL_COMMUNITY): Payer: Medicare Other

## 2016-09-16 DIAGNOSIS — K219 Gastro-esophageal reflux disease without esophagitis: Secondary | ICD-10-CM

## 2016-09-16 DIAGNOSIS — N183 Chronic kidney disease, stage 3 (moderate): Secondary | ICD-10-CM

## 2016-09-16 DIAGNOSIS — I10 Essential (primary) hypertension: Secondary | ICD-10-CM

## 2016-09-16 LAB — BASIC METABOLIC PANEL
ANION GAP: 6 (ref 5–15)
BUN: 29 mg/dL — AB (ref 6–20)
CHLORIDE: 111 mmol/L (ref 101–111)
CO2: 25 mmol/L (ref 22–32)
Calcium: 7.9 mg/dL — ABNORMAL LOW (ref 8.9–10.3)
Creatinine, Ser: 1.86 mg/dL — ABNORMAL HIGH (ref 0.61–1.24)
GFR calc Af Amer: 40 mL/min — ABNORMAL LOW (ref >60)
GFR calc non Af Amer: 35 mL/min — ABNORMAL LOW (ref >60)
GLUCOSE: 128 mg/dL — AB (ref 65–99)
POTASSIUM: 3.9 mmol/L (ref 3.5–5.1)
Sodium: 142 mmol/L (ref 135–145)

## 2016-09-16 LAB — GLUCOSE, CAPILLARY
GLUCOSE-CAPILLARY: 105 mg/dL — AB (ref 65–99)
Glucose-Capillary: 179 mg/dL — ABNORMAL HIGH (ref 65–99)

## 2016-09-16 LAB — TROPONIN I: Troponin I: <0.03 ng/mL (ref <0.03)

## 2016-09-16 MED ORDER — DULOXETINE HCL 60 MG PO CPEP
60.0000 mg | ORAL_CAPSULE | Freq: Every day | ORAL | Status: DC
  Administered 2016-09-16: 60 mg via ORAL
  Filled 2016-09-16: qty 1

## 2016-09-16 MED ORDER — FUROSEMIDE 40 MG PO TABS: 40.0000 mg | ORAL_TABLET | Freq: Every day | ORAL | 0 refills | Status: DC

## 2016-09-16 MED ORDER — ISOSORB DINITRATE-HYDRALAZINE 20-37.5 MG PO TABS: 1.0000 | ORAL_TABLET | Freq: Three times a day (TID) | ORAL | 0 refills | Status: DC

## 2016-09-16 MED ORDER — HYDROCODONE-ACETAMINOPHEN 5-325 MG PO TABS
1.0000 | ORAL_TABLET | Freq: Three times a day (TID) | ORAL | Status: DC | PRN
  Administered 2016-09-16: 1 via ORAL
  Filled 2016-09-16: qty 1

## 2016-09-16 MED ORDER — ALLOPURINOL 300 MG PO TABS
300.0000 mg | ORAL_TABLET | Freq: Every day | ORAL | Status: DC
  Administered 2016-09-16: 300 mg via ORAL
  Filled 2016-09-16: qty 1

## 2016-09-16 MED ORDER — FUROSEMIDE 40 MG PO TABS
40.0000 mg | ORAL_TABLET | Freq: Every day | ORAL | Status: DC
  Administered 2016-09-16: 40 mg via ORAL
  Filled 2016-09-16: qty 1

## 2016-09-16 MED ORDER — CYCLOBENZAPRINE HCL 10 MG PO TABS: 5.0000 mg | ORAL_TABLET | Freq: Three times a day (TID) | ORAL | Status: DC | PRN

## 2016-09-16 NOTE — Care Management Note (Signed)
Case Management Note  Patient Details  Name: [name] MRN: [id number] Date of Birth: 1945-10-04  Subjective/Objective:                  Pt admitted with new onset chf. Pt is from home with family. He is ind with ADL's. He has PCP, transportation to appointments and no difficulty affording or managing medications. Pt has nor needs any HH/DME PTA. Pt has scale at home to use. Pt plans to return home today with self care.   Action/Plan: No CM needs. Discharging home today.   Expected Discharge Date:  09/16/16               Expected Discharge Plan:  Home/Self Care  In-House Referral:  NA  Discharge planning Services  CM Consult  Post Acute Care Choice:  NA Choice offered to:  NA  Status of Service:  Completed, signed off  Sherald Barge, RN 09/16/2016, 1:22 PM

## 2016-09-16 NOTE — Discharge Summary (Signed)
Physician Discharge Summary  DADEN MAHANY IRS:[id number] DOB: 02-02-1946 DOA: 09/15/2016  PCP: [name], MD  Admit date: 09/15/2016 Discharge date: 09/16/2016  Admitted From: Home Disposition:  Home  Recommendations for Outpatient Follow-up:  1. Follow up with PCP in 1-2 weeks 2. Take medications as prescribed 3. Cardiology follow up as discussed with Cardiology this am 4. Please obtain BMP/CBC in one week 5. Call Oncologist to discuss restarting your chemotherapy medication   Home Health: No Equipment/Devices: None  Discharge Condition: Stable CODE STATUS: Full Code Diet recommendation: Heart Healthy / Carb Modified  Brief/Interim Summary: [name] is a 71 y.o. male with medical history significant of recent cardiac cath with stent placement (Jan 2018), HLD, HTN, GERD, DM Type II that presents for shortness of breath.   Patient daughter reports that last week she took him to [facility] for chemotherapy and was told at that time his kidney function was normal.  She states hew as placed on allopurinol to protect the kidneys from chemotherapy.  Has been on chemotherapy since 2/20. Two days ago patient reports that he began feeling pressure in his chest and he felt like he couldn't breath.  He mentions as time went on his shortness of breath worsened andhe decided to come in.  Dyspnea occurring even at rest.  Orthopnea last night.  Says that laying down even at an incline he had issues catching his breath.  Normally he lays down flat to sleep.  Denies swelling in his legs, no claudication.  No cough, no fevers or chills.  Pain with inspiration that is occasional but happening for the past two days.  Was supposed to follow up with cardiology within 2 months. See's Dr. Harl Bowie here in [place]. Oncologist is Dr. Florene Glen at [facility]. Eating and drinking normally for the past two days but has had less of an appetitie.  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[place])   Hyperlipidemia LDL goal <70    Discharge Instructions  Discharge Instructions    Call MD for:  difficulty breathing, headache or visual disturbances    Complete by:  As directed    Call MD for:  extreme fatigue    Complete by:  As directed    Call MD for:  hives    Complete by:  As directed    Call MD for:  persistant dizziness or light-headedness    Complete by:  As directed    Call MD for:  persistant nausea and vomiting    Complete by:  As directed    Call MD for:  severe uncontrolled pain    Complete by:  As directed    Call MD for:  temperature >100.4    Complete by:  As directed    Diet - low sodium heart healthy    Complete by:  As directed    Discharge instructions    Complete by:  As directed    Please call your PCP and get set up with lab work within the next week Stop taking lisinopril Fill new prescriptions Call your oncologist and ask when to restart Chemo (call within 1-2 days)   Increase activity slowly    Complete by:  As directed      Allergies  as of 09/16/2016      Reactions   Hydrocodone Itching      Medication List    STOP taking these medications   hydrALAZINE 25 MG tablet Commonly known as:  APRESOLINE   lisinopril 20 MG tablet Commonly known as:  PRINIVIL,ZESTRIL     TAKE these medications   acetaminophen 500 MG tablet Commonly known as:  TYLENOL Take 1,000 mg by mouth every 6 (six) hours as needed for mild pain or moderate pain.   allopurinol 300 MG tablet Commonly known as:  ZYLOPRIM Take 300 mg by mouth daily.   aspirin EC 81 MG tablet Take 1 tablet (81 mg total) by mouth daily.   atorvastatin 80 MG tablet Commonly known as:  LIPITOR Take 1 tablet (80 mg total) by mouth daily at  6 PM.   clopidogrel 75 MG tablet Commonly known as:  PLAVIX Take 1 tablet (75 mg total) by mouth daily with breakfast.   cyclobenzaprine 5 MG tablet Commonly known as:  FLEXERIL Take 1 tablet (5 mg total) by mouth 3 (three) times daily as needed for muscle spasms.   DULoxetine 60 MG capsule Commonly known as:  CYMBALTA Take 60 mg by mouth daily.   feeding supplement (ENSURE ENLIVE) Liqd Take 237 mLs by mouth 2 (two) times daily between meals.   furosemide 40 MG tablet Commonly known as:  LASIX Take 1 tablet (40 mg total) by mouth daily. Start taking on:  09/17/2016   HYDROcodone-acetaminophen 5-325 MG tablet Commonly known as:  NORCO/VICODIN Take 1 tablet by mouth every 8 (eight) hours as needed for pain.   isosorbide-hydrALAZINE 20-37.5 MG tablet Commonly known as:  BIDIL Take 1 tablet by mouth 3 (three) times daily.   JAKAFI 20 MG tablet Generic drug:  ruxolitinib phosphate Take 20 mg by mouth 2 (two) times daily.   metFORMIN 500 MG tablet Commonly known as:  GLUCOPHAGE Take 500 mg by mouth daily.   metoprolol succinate 50 MG 24 hr tablet Commonly known as:  TOPROL-XL TAKE 1.5 TABLETS DAILY   nitroGLYCERIN 0.4 MG SL tablet Commonly known as:  NITROSTAT Place 1 tablet (0.4 mg total) under the tongue every 5 (five) minutes as needed for chest pain.   pantoprazole 40 MG tablet Commonly known as:  PROTONIX Take 1 tablet (40 mg total) by mouth daily.   tiZANidine 4 MG capsule Commonly known as:  ZANAFLEX Take 4 mg by mouth 2 (two) times daily.   traMADol 50 MG tablet Commonly known as:  ULTRAM Take 50 mg by mouth every 6 (six) hours as needed.   traZODone 50 MG tablet Commonly known as:  DESYREL Take 50 mg by mouth at bedtime as needed for sleep.      Follow-up Information    [name], PA-C Follow up on 10/01/2016.   Specialty:  Cardiology Why:  at 11:40 am Contact information: [place] [address] [phone]         [facility], MD. Schedule an appointment as soon as possible for a visit in 2 week(s).   Specialty:  Internal Medicine Contact information: [address] [place] [address] [phone]          Allergies  Allergen Reactions  . Hydrocodone Itching    Consultations:  Cardiology  PT  CM   Procedures/Studies: Dg Chest 2 View  Result Date: 09/15/2016 CLINICAL DATA:  Acute onset of shortness of breath. Initial encounter. EXAM: CHEST  2 VIEW COMPARISON:  Chest radiograph performed 04/27/2016 FINDINGS:  The lungs are mildly hypoexpanded. Bibasilar airspace opacities may reflect mild interstitial edema. Vascular congestion is noted. Small bilateral pleural effusions are seen. There is no evidence of pneumothorax. The heart is normal in size; the mediastinal contour is within normal limits. No acute osseous abnormalities are seen. IMPRESSION: Lungs mildly hypoexpanded. Bibasilar airspace opacities may reflect mild interstitial edema. Vascular congestion noted. Small bilateral pleural effusions seen. Electronically Signed   By: Garald Balding M.D.   On: 09/15/2016 06:22   US Renal  Result Date: 09/15/2016 CLINICAL DATA:  71 year old male with acute kidney injury EXAM: RENAL / URINARY TRACT ULTRASOUND COMPLETE COMPARISON:  Prior renal ultrasound 05/13/2016 FINDINGS: Right Kidney: Length: 10.7 cm. Mildly echogenic renal parenchyma with increased conspicuity of the corticomedullary interface. No mass or hydronephrosis visualized. Left Kidney: Length: 11.6 cm. Mildly echogenic renal parenchyma with increased conspicuity of the corticomedullary interface. No mass or hydronephrosis visualized. Bladder: Appears normal for degree of bladder distention. IMPRESSION: 1. Negative for hydronephrosis. 2. Mildly echogenic renal parenchyma bilaterally suggests underlying medical renal disease. Electronically Signed   By: Jacqulynn Cadet M.D.   On: 09/15/2016 14:00   Echocardiogram: Mild LVH with LVEF 60-65%  and grade 2 diastolic dysfunction. Mild to moderate left atrial enlargement. Mild mitral regurgitation and aortic regurgitation. Mild tricuspid regurgitation with   evidence of moderate pulmonary hypertension, PASP 57 mmHg.   Subjective: Patient feeling well.  States he wants to go home.  Reports his chest pressure/ fullness has improved.  Understands to call his oncologist to discuss restarting his chemotherapy medication.  Discharge Exam: Vitals:   09/16/16 0513 09/16/16 0910  BP: (!) 145/68 (!) 160/69  Pulse: 61 67  Resp: 20 20  Temp: 98.1 F (36.7 C)    Vitals:   09/15/16 2142 09/16/16 0513 09/16/16 0910 09/16/16 1041  BP: 140/63 (!) 145/68 (!) 160/69   Pulse: (!) 52 61 67   Resp: 20 20 20    Temp: 97.9 F (36.6 C) 98.1 F (36.7 C)    TempSrc: Oral Oral    SpO2: 97% 96% 98% 94%  Weight:  88.9 kg (196 lb)    Height:        General: Pt is alert, awake, not in acute distress Cardiovascular: RRR, S1/S2 +, no rubs, no gallops Respiratory: few rales bibasilarly, no wheezing, no rhonchi Abdominal: Soft, NT, ND, bowel sounds + Extremities: no edema, no cyanosis    The results of significant diagnostics from this hospitalization (including imaging, microbiology, ancillary and laboratory) are listed below for reference.     Microbiology: No results found for this or any previous visit (from the past 240 hour(s)).   Labs: BNP (last 3 results)  Recent Labs  09/15/16 0543  BNP 468.0*   Basic Metabolic Panel:  Recent Labs Lab 09/15/16 0543 09/16/16 0533  NA 141 142  K 4.0 3.9  CL 110 111  CO2 26 25  GLUCOSE 128* 128*  BUN 28* 29*  CREATININE 1.96* 1.86*  CALCIUM 8.3* 7.9*   Liver Function Tests: No results for input(s): AST, ALT, ALKPHOS, BILITOT, PROT, ALBUMIN in the last 168 hours. No results for input(s): LIPASE, AMYLASE in the last 168 hours. No results for input(s): AMMONIA in the last 168 hours. CBC:  Recent Labs Lab 09/15/16 0543  WBC 24.2*   NEUTROABS 10.9*  HGB 13.6  HCT 40.2  MCV 88.4  PLT 437*   Cardiac Enzymes:  Recent Labs Lab 09/15/16 0543 09/15/16 1219 09/15/16 1910 09/16/16 0013  TROPONINI <0.03 <0.03 <0.03 <  0.03   BNP: Invalid input(s): POCBNP CBG:  Recent Labs Lab 09/15/16 1629 09/15/16 2144 09/16/16 0724 09/16/16 1103  GLUCAP 124* 119* 105* 179*   D-Dimer  Recent Labs  09/15/16 0543  DDIMER 0.66*   Hgb A1c No results for input(s): HGBA1C in the last 72 hours. Lipid Profile No results for input(s): CHOL, HDL, LDLCALC, TRIG, CHOLHDL, LDLDIRECT in the last 72 hours. Thyroid function studies No results for input(s): TSH, T4TOTAL, T3FREE, THYROIDAB in the last 72 hours.  Invalid input(s): FREET3 Anemia work up No results for input(s): VITAMINB12, FOLATE, FERRITIN, TIBC, IRON, RETICCTPCT in the last 72 hours. Urinalysis    Component Value Date/Time   COLORURINE YELLOW 09/15/2016 0824   APPEARANCEUR CLEAR 09/15/2016 0824   LABSPEC 1.010 09/15/2016 0824   PHURINE 6.0 09/15/2016 0824   GLUCOSEU NEGATIVE 09/15/2016 0824   HGBUR NEGATIVE 09/15/2016 0824   BILIRUBINUR NEGATIVE 09/15/2016 0824   KETONESUR NEGATIVE 09/15/2016 0824   PROTEINUR >=300 (A) 09/15/2016 0824   UROBILINOGEN 0.2 03/26/2008 1920   NITRITE NEGATIVE 09/15/2016 0824   LEUKOCYTESUR NEGATIVE 09/15/2016 0824   Sepsis Labs Invalid input(s): PROCALCITONIN,  WBC,  LACTICIDVEN Microbiology No results found for this or any previous visit (from the past 240 hour(s)).   Time coordinating discharge: 35 minutes  SIGNED:   Loretha Stapler, MD  [facility] 09/16/2016, 12:06 PM Pager [phone] If 7PM-7AM, please contact night-coverage [email or web address] Password [email or web address]

## 2016-09-16 NOTE — Progress Notes (Signed)
Progress Note  Patient Name: [name] Date of Encounter: 09/16/2016  Primary Cardiologist: Dr. Carlyle Dolly  Subjective   Feels better today, improved sense of fullness in his chest, less coughing.  Inpatient Medications    Scheduled Meds: . atorvastatin  80 mg Oral q1800  . clopidogrel  75 mg Oral Q breakfast  . isosorbide-hydrALAZINE  1 tablet Oral TID  . pantoprazole  40 mg Oral Daily    PRN Meds: acetaminophen **OR** acetaminophen, bisacodyl, ondansetron **OR** ondansetron (ZOFRAN) IV, polyethylene glycol   Vital Signs    Vitals:   09/15/16 1250 09/15/16 1433 09/15/16 2142 09/16/16 0513  BP: (!) 173/70 (!) 166/76 140/63 (!) 145/68  Pulse: (!) 58 62 (!) 52 61  Resp:  20 20 20   Temp:  98.6 F (37 C) 97.9 F (36.6 C) 98.1 F (36.7 C)  TempSrc:  Oral Oral Oral  SpO2:  98% 97% 96%  Weight:    196 lb (88.9 kg)  Height:        Intake/Output Summary (Last 24 hours) at 09/16/16 0855 Last data filed at 09/15/16 1844  Gross per 24 hour  Intake              240 ml  Output              400 ml  Net             -160 ml   Filed Weights   09/15/16 0456 09/15/16 1226 09/16/16 0513  Weight: 192 lb (87.1 kg) 195 lb 14.4 oz (88.9 kg) 196 lb (88.9 kg)    Telemetry    I personally reviewed telemetry which shows sinus rhythm and sinus bradycardia. No pauses.  ECG    I personally reviewed the tracing from 09/15/2016 which shows sinus rhythm.  Physical Exam   GEN: No acute distress.   Neck: No JVD Cardiac: RRR, no murmurs, rubs, or gallops.  Respiratory: Decreased breath sounds at the bases, no crackles. GI: Soft, nontender, non-distended  MS: No edema; No deformity.   Labs    Chemistry Recent Labs Lab 09/15/16 0543 09/16/16 0533  NA 141 142  K 4.0 3.9  CL 110 111  CO2 26 25  GLUCOSE 128* 128*  BUN 28* 29*  CREATININE 1.96* 1.86*  CALCIUM 8.3* 7.9*  GFRNONAA 33* 35*  GFRAA 38* 40*  ANIONGAP 5 6     Hematology Recent Labs Lab  09/15/16 0543  WBC 24.2*  RBC 4.55  HGB 13.6  HCT 40.2  MCV 88.4  MCH 29.9  MCHC 33.8  RDW 21.8*  PLT 437*    Cardiac Enzymes Recent Labs Lab 09/15/16 0543 09/15/16 1219 09/15/16 1910 09/16/16 0013  TROPONINI <0.03 <0.03 <0.03 <0.03   No results for input(s): TROPIPOC in the last 168 hours.   BNP Recent Labs Lab 09/15/16 0543  BNP 402.0*     DDimer  Recent Labs Lab 09/15/16 0543  DDIMER 0.66*     Radiology    Dg Chest 2 View  Result Date: 09/15/2016 CLINICAL DATA:  Acute onset of shortness of breath. Initial encounter. EXAM: CHEST  2 VIEW COMPARISON:  Chest radiograph performed 04/27/2016 FINDINGS: The lungs are mildly hypoexpanded. Bibasilar airspace opacities may reflect mild interstitial edema. Vascular congestion is noted. Small bilateral pleural effusions are seen. There is no evidence of pneumothorax. The heart is normal in size; the mediastinal contour is within normal limits. No acute osseous abnormalities are seen. IMPRESSION: Lungs mildly hypoexpanded. Bibasilar airspace opacities  may reflect mild interstitial edema. Vascular congestion noted. Small bilateral pleural effusions seen. Electronically Signed   By: Garald Balding M.D.   On: 09/15/2016 06:22   US Renal  Result Date: 09/15/2016 CLINICAL DATA:  71 year old male with acute kidney injury EXAM: RENAL / URINARY TRACT ULTRASOUND COMPLETE COMPARISON:  Prior renal ultrasound 05/13/2016 FINDINGS: Right Kidney: Length: 10.7 cm. Mildly echogenic renal parenchyma with increased conspicuity of the corticomedullary interface. No mass or hydronephrosis visualized. Left Kidney: Length: 11.6 cm. Mildly echogenic renal parenchyma with increased conspicuity of the corticomedullary interface. No mass or hydronephrosis visualized. Bladder: Appears normal for degree of bladder distention. IMPRESSION: 1. Negative for hydronephrosis. 2. Mildly echogenic renal parenchyma bilaterally suggests underlying medical renal disease.  Electronically Signed   By: Jacqulynn Cadet M.D.   On: 09/15/2016 14:00    Cardiac Studies   Echocardiogram 09/15/2016: Study Conclusions  - Left ventricle: The cavity size was normal. Wall thickness was   increased in a pattern of mild LVH. Systolic function was normal.   The estimated ejection fraction was in the range of 60% to 65%.   Wall motion was normal; there were no regional wall motion   abnormalities. Features are consistent with a pseudonormal left   ventricular filling pattern, with concomitant abnormal relaxation   and increased filling pressure (grade 2 diastolic dysfunction). - Aortic valve: Mildly calcified annulus. Trileaflet. There was   mild regurgitation. - Mitral valve: There was mild regurgitation. - Left atrium: The atrium was mildly to moderately dilated. - Right atrium: The atrium was mildly dilated. Central venous   pressure (est): 3 mm Hg. - Atrial septum: No defect or patent foramen ovale was identified. - Tricuspid valve: There was mild regurgitation. - Pulmonary arteries: PA peak pressure: 57 mm Hg (S). - Pericardium, extracardiac: A trivial pericardial effusion was   identified posterior to the heart.  Impressions:  - Mild LVH with LVEF 60-65% and grade 2 diastolic dysfunction. Mild   to moderate left atrial enlargement. Mild mitral regurgitation   and aortic regurgitation. Mild tricuspid regurgitation with   evidence of moderate pulmonary hypertension, PASP 57 mmHg.  Patient Profile     71 y.o. male status post recent DES to the circumflex in late January following an abnormal Myoview with residual moderate disease in the LAD and RCA including a tighter stenosis involving a small obtuse marginal branch, all of which were managed medically. He has myelodysplastic syndrome followed at [facility], recently started on Ruxolitinib. Presents with shortness of breath and largely pleuritic chest discomfort, small pleural effusions and interstitial edema by  chest x-ray. Also renal insufficiency with creatinine up to 1.9. ACE inhibitor was discontinued. Renal ultrasound without hydronephrosis, some degree of medical renal disease. Follow-up echocardiogram shows preserved LVEF with moderate diastolic dysfunction and also moderate pulmonary hypertension.  Assessment & Plan    1. Possible exacerbation of diastolic heart failure, trigger unclear. He does have ischemic heart disease and underwent recent DES to the circumflex, troponin I levels are negative and argue against ACS. ECG without acute ST segment changes.  2. Acute on chronic renal insufficiency, CKD stage 3 at baseline (prior creatinine 1.4-1.5 range). ACE inhibitor discontinued. Creatinine down to 1.8.  3. 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  4. Essential hypertension.  Cardiac regimen to include aspirin, Plavix, hydralazine/nitrate, and Lipitor. Stay off ACE inhibitor. Suspect he will need at least a low-dose diuretic, will start Lasix 40 mg daily, keep an eye on BMET. We will schedule a follow-up visit in the office  in the next few weeks for cardiac reassessment.  Signed, Rozann Lesches, MD  09/16/2016, 8:55 AM

## 2016-09-16 NOTE — Care Management Important Message (Signed)
Important Message  Patient Details  Name: [name] MRN: [id number] Date of Birth: 31-Jan-1946   Medicare Important Message Given:  Yes    Sherald Barge, RN 09/16/2016, 1:25 PM

## 2016-09-16 NOTE — Evaluation (Signed)
Physical Therapy Evaluation Patient Details Name: [name] MRN: [id number] DOB: 15-Nov-1945 Today's Date: 09/16/2016   History of Present Illness  [name] is a 71 y.o. male with medical history significant of recent cardiac cath with stent placement (Jan 2018), HLD, HTN, GERD, DM Type II that presents for shortness of breath.   Patient daughter reports that last week she took him to [facility] for chemotherapy and was told at that time his kidney function was normal.  She states hew as placed on allopurinol to protect the kidneys from chemotherapy.  Has been on chemotherapy since 2/20. Two days ago patient reports that he began feeling pressure in his chest and he felt like he couldn't breath.  He mentions as time went on his shortness of breath worsened andhe decided to come in.  Dyspnea occurring even at rest.  Orthopnea last night.  Says that laying down even at an incline he had issues catching his breath.  Normally he lays down flat to sleep.  Denies swelling in his legs, no claudication.  No cough, no fevers or chills.  Pain with inspiration that is occasional but happening for the past two days.  Was supposed to follow up with cardiology within 2 months. See's Dr. Harl Bowie here in [place]. Oncologist is Dr. Florene Glen at [facility]. Eating and drinking normally for the past two days but has had less of an appetite.  Clinical Impression  Pt received in bed and agreeable to participate in PT services. Pt performed bed mobility, transfers, and gait independently. He was able to walk 300 ft without an AD with no LOB or c/o SOB. Pt did state that he was having some chest pressure, but "that this was typical for him." Pt verbalized that his mobility is currently at his baseline PLOF. Pt left sitting in chair with call bell and phone in reach. Pt does not required acute or follow-up PT services at this time. PT signing off.    Follow Up Recommendations No PT follow up    Equipment  Recommendations  None recommended by PT    Recommendations for Other Services  (None)     Precautions / Restrictions Precautions Precautions: None Restrictions Weight Bearing Restrictions: No      Mobility  Bed Mobility Overal bed mobility: Independent                Transfers Overall transfer level: Independent                  Ambulation/Gait Ambulation/Gait assistance: Independent Ambulation Distance (Feet): 300 Feet   Gait Pattern/deviations: WFL(Within Functional Limits)        Stairs            Wheelchair Mobility    Modified Rankin (Stroke Patients Only)       Balance Overall balance assessment: Independent                                           Pertinent Vitals/Pain Pain Assessment: 0-10 Pain Score: 3  Pain Location: his "typical chest pressure" Pain Descriptors / Indicators: Pressure Pain Intervention(s): Limited activity within patient's tolerance    Home Living Family/patient expects to be discharged to:: Private residence Living Arrangements: Spouse/significant other   Type of Home: Mobile home Home Access: Stairs to enter Entrance Stairs-Rails: Psychiatric nurse of Steps: 5 Home Layout: One level Home Equipment: Environmental consultant -  2 wheels      Prior Function Level of Independence: Independent               Hand Dominance   Dominant Hand: Right    Extremity/Trunk Assessment                Communication   Communication: No difficulties  Cognition Arousal/Alertness: Awake/alert Behavior During Therapy: WFL for tasks assessed/performed Overall Cognitive Status: Within Functional Limits for tasks assessed                      General Comments      Exercises     Assessment/Plan    PT Assessment Patent does not need any further PT services  PT Problem List         PT Treatment Interventions      PT Goals (Current goals can be found in the Care Plan  section)  Acute Rehab PT Goals Patient Stated Goal: to go home PT Goal Formulation: With patient Time For Goal Achievement: 09/18/16 Potential to Achieve Goals: Good    Frequency     Barriers to discharge        Co-evaluation               End of Session Equipment Utilized During Treatment: Gait belt Activity Tolerance: Patient tolerated treatment well;No increased pain Patient left: in chair;with call bell/phone within reach   PT Visit Diagnosis: Other abnormalities of gait and mobility (R26.89)    Functional Assessment Tool Used: AM-PAC 6 Clicks Basic Mobility;Clinical judgement Functional Limitation: Mobility: Walking and moving around Mobility: Walking and Moving Around Current Status (G3875): At least 1 percent but less than 20 percent impaired, limited or restricted Mobility: Walking and Moving Around Goal Status [phone]): At least 1 percent but less than 20 percent impaired, limited or restricted Mobility: Walking and Moving Around Discharge Status [phone]): At least 1 percent but less than 20 percent impaired, limited or restricted    Time: 1045-1054 PT Time Calculation (min) (ACUTE ONLY): 9 min   Charges:   PT Evaluation $PT Eval Low Complexity: 1 Procedure     PT G Codes:   PT G-Codes **NOT FOR INPATIENT CLASS** Functional Assessment Tool Used: AM-PAC 6 Clicks Basic Mobility;Clinical judgement Functional Limitation: Mobility: Walking and moving around Mobility: Walking and Moving Around Current Status (C1660): At least 1 percent but less than 20 percent impaired, limited or restricted Mobility: Walking and Moving Around Goal Status [phone]): At least 1 percent but less than 20 percent impaired, limited or restricted Mobility: Walking and Moving Around Discharge Status [phone]): At least 1 percent but less than 20 percent impaired, limited or restricted      Geraldine Solar PT, DPT

## 2016-09-16 NOTE — Progress Notes (Signed)
[name] discharged Home per MD order.  Discharge instructions reviewed and discussed with the patient, all questions and concerns answered. Copy of instructions given to patient.  Allergies as of 09/16/2016      Reactions   Hydrocodone Itching      Medication List    STOP taking these medications   hydrALAZINE 25 MG tablet Commonly known as:  APRESOLINE   lisinopril 20 MG tablet Commonly known as:  PRINIVIL,ZESTRIL     TAKE these medications   acetaminophen 500 MG tablet Commonly known as:  TYLENOL Take 1,000 mg by mouth every 6 (six) hours as needed for mild pain or moderate pain.   allopurinol 300 MG tablet Commonly known as:  ZYLOPRIM Take 300 mg by mouth daily.   aspirin EC 81 MG tablet Take 1 tablet (81 mg total) by mouth daily.   atorvastatin 80 MG tablet Commonly known as:  LIPITOR Take 1 tablet (80 mg total) by mouth daily at 6 PM.   clopidogrel 75 MG tablet Commonly known as:  PLAVIX Take 1 tablet (75 mg total) by mouth daily with breakfast.   cyclobenzaprine 5 MG tablet Commonly known as:  FLEXERIL Take 1 tablet (5 mg total) by mouth 3 (three) times daily as needed for muscle spasms.   DULoxetine 60 MG capsule Commonly known as:  CYMBALTA Take 60 mg by mouth daily.   feeding supplement (ENSURE ENLIVE) Liqd Take 237 mLs by mouth 2 (two) times daily between meals.   furosemide 40 MG tablet Commonly known as:  LASIX Take 1 tablet (40 mg total) by mouth daily. Start taking on:  09/17/2016   HYDROcodone-acetaminophen 5-325 MG tablet Commonly known as:  NORCO/VICODIN Take 1 tablet by mouth every 8 (eight) hours as needed for pain.   isosorbide-hydrALAZINE 20-37.5 MG tablet Commonly known as:  BIDIL Take 1 tablet by mouth 3 (three) times daily.   JAKAFI 20 MG tablet Generic drug:  ruxolitinib phosphate Take 20 mg by mouth 2 (two) times daily.   metFORMIN 500 MG tablet Commonly known as:  GLUCOPHAGE Take 500 mg by mouth daily.   metoprolol  succinate 50 MG 24 hr tablet Commonly known as:  TOPROL-XL TAKE 1.5 TABLETS DAILY   nitroGLYCERIN 0.4 MG SL tablet Commonly known as:  NITROSTAT Place 1 tablet (0.4 mg total) under the tongue every 5 (five) minutes as needed for chest pain.   pantoprazole 40 MG tablet Commonly known as:  PROTONIX Take 1 tablet (40 mg total) by mouth daily.   tiZANidine 4 MG capsule Commonly known as:  ZANAFLEX Take 4 mg by mouth 2 (two) times daily.   traMADol 50 MG tablet Commonly known as:  ULTRAM Take 50 mg by mouth every 6 (six) hours as needed.   traZODone 50 MG tablet Commonly known as:  DESYREL Take 50 mg by mouth at bedtime as needed for sleep.       Patients skin is clean, dry and intact, no evidence of skin break down. IV site discontinued and catheter remains intact. Site without signs and symptoms of complications. Dressing and pressure applied.  Patient escorted to car by NT in a wheelchair,  no distress noted upon discharge.  [name] [name] 09/16/2016 2:47 PM

## 2016-09-19 DIAGNOSIS — F329 Major depressive disorder, single episode, unspecified: Secondary | ICD-10-CM | POA: Diagnosis not present

## 2016-09-19 DIAGNOSIS — E1122 Type 2 diabetes mellitus with diabetic chronic kidney disease: Secondary | ICD-10-CM | POA: Diagnosis not present

## 2016-09-19 DIAGNOSIS — Z6830 Body mass index (BMI) 30.0-30.9, adult: Secondary | ICD-10-CM | POA: Diagnosis not present

## 2016-09-19 DIAGNOSIS — Z299 Encounter for prophylactic measures, unspecified: Secondary | ICD-10-CM | POA: Diagnosis not present

## 2016-09-19 DIAGNOSIS — Z713 Dietary counseling and surveillance: Secondary | ICD-10-CM | POA: Diagnosis not present

## 2016-09-19 DIAGNOSIS — M545 Low back pain: Secondary | ICD-10-CM | POA: Diagnosis not present

## 2016-09-19 DIAGNOSIS — N183 Chronic kidney disease, stage 3 (moderate): Secondary | ICD-10-CM | POA: Diagnosis not present

## 2016-09-19 DIAGNOSIS — D471 Chronic myeloproliferative disease: Secondary | ICD-10-CM | POA: Diagnosis not present

## 2016-10-01 ENCOUNTER — Other Ambulatory Visit (HOSPITAL_COMMUNITY)
Admission: RE | Admit: 2016-10-01 | Discharge: 2016-10-01 | Disposition: A | Discharge status: Home / Self Care | Payer: Medicare Other | Source: Ambulatory Visit | Attending: Physician Assistant | Admitting: Physician Assistant

## 2016-10-01 ENCOUNTER — Encounter: Payer: Self-pay | Admitting: Physician Assistant

## 2016-10-01 ENCOUNTER — Ambulatory Visit (INDEPENDENT_AMBULATORY_CARE_PROVIDER_SITE_OTHER): Payer: Medicare Other | Admitting: Physician Assistant

## 2016-10-01 ENCOUNTER — Telehealth: Payer: Self-pay | Admitting: *Deleted

## 2016-10-01 VITALS — BP 150/70 | HR 70 | Ht 72.0 in | Wt 197.0 lb

## 2016-10-01 DIAGNOSIS — I2583 Coronary atherosclerosis due to lipid rich plaque: Secondary | ICD-10-CM

## 2016-10-01 DIAGNOSIS — E876 Hypokalemia: Secondary | ICD-10-CM

## 2016-10-01 DIAGNOSIS — I5032 Chronic diastolic (congestive) heart failure: Secondary | ICD-10-CM | POA: Diagnosis not present

## 2016-10-01 DIAGNOSIS — D471 Chronic myeloproliferative disease: Secondary | ICD-10-CM

## 2016-10-01 DIAGNOSIS — N183 Chronic kidney disease, stage 3 unspecified: Secondary | ICD-10-CM

## 2016-10-01 DIAGNOSIS — I2510 Atherosclerotic heart disease of native coronary artery without angina pectoris: Secondary | ICD-10-CM | POA: Diagnosis not present

## 2016-10-01 DIAGNOSIS — I10 Essential (primary) hypertension: Secondary | ICD-10-CM | POA: Diagnosis not present

## 2016-10-01 LAB — BASIC METABOLIC PANEL
ANION GAP: 6 (ref 5–15)
BUN: 20 mg/dL (ref 6–20)
CHLORIDE: 105 mmol/L (ref 101–111)
CO2: 28 mmol/L (ref 22–32)
Calcium: 8 mg/dL — ABNORMAL LOW (ref 8.9–10.3)
Creatinine, Ser: 1.64 mg/dL — ABNORMAL HIGH (ref 0.61–1.24)
GFR calc Af Amer: 47 mL/min — ABNORMAL LOW (ref >60)
GFR, EST NON AFRICAN AMERICAN: 40 mL/min — AB (ref >60)
Glucose, Bld: 95 mg/dL (ref 65–99)
POTASSIUM: 3.4 mmol/L — AB (ref 3.5–5.1)
Sodium: 139 mmol/L (ref 135–145)

## 2016-10-01 MED ORDER — POTASSIUM CHLORIDE CRYS ER 20 MEQ PO TBCR: 20.0000 meq | EXTENDED_RELEASE_TABLET | Freq: Every day | ORAL | 3 refills | Status: DC

## 2016-10-01 NOTE — Progress Notes (Signed)
Cardiology Office Note    Date:  10/01/2016   ID:  [name], DOB [birth year], MRN [id number]  PCP:  [name], MD  Cardiologist: Dr. Harl Bowie   Chief Complaint  Patient presents with  . Follow-up    History of Present Illness:  [name] is a 71 y.o. male status post DES to the circumflex 07/2016 following an abnormal Myoview with residual moderate disease in the LAD and RCA including a tighter stenosis involving a small OM branch of which were managed medically. He has mild myelodysplastic syndrome followed at [facility] and recently started on chemotherapy. He had a recent admission to the hospital with diastolic CHF and renal insufficiency with creatinine up to 1.9. ACE inhibitor was discontinued. Renal ultrasound without hydronephrosis but some degree of medical renal disease. Follow-up 2-D echo shows preserved LV EF with moderate diastolic dysfunction and moderate pulmonary hypertension. Troponin's were negative and EKG without acute change.  Patient comes in today stating he still has occasions of shortness of breath where he feels like his breathing stops and he has to stop and catch his breath. It occurs when he lays down at night or when he is walking to his mailbox. It also occurs when he starts to lean back and his recliner. He says his spleen is extremely enlarged and he feels like it's pressing on his diaphragm causing the shortness of breath. He says he sleeps flat at night without difficulty. He has had no edema. He denies any chest pain but says he really never had any chest pain.    Past Medical History:  Diagnosis Date  . Anxiety   . Arthritis   . BPH (benign prostatic hyperplasia)   . CAD (coronary artery disease)    DES to circumflex 07/2016, moderate residual LAD and RCA, small 90% OM3 - managed medically  . Chronic lower back pain   . Depression   . Essential hypertension   . Gastritis   . GERD (gastroesophageal reflux disease)   . History of  kidney stones   . 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 Pneumonia X 1  . Renal insufficiency   . Spleen enlarged   . Type II diabetes mellitus ([place])     Past Surgical History:  Procedure Laterality Date  . BACK SURGERY    . BONE MARROW BIOPSY     x 3 times  . CARDIAC CATHETERIZATION N/A 08/12/2016   Procedure: Left Heart Cath and Coronary Angiography;  Surgeon: Jettie Booze, MD;  Location: [place] [facility];  Service: Cardiovascular;  Laterality: N/A;  . CARDIAC CATHETERIZATION N/A 08/12/2016   Procedure: Coronary Stent Intervention;  Surgeon: Jettie Booze, MD;  Location: [place] [facility];  Service: Cardiovascular;  Laterality: N/A;  . CARDIAC CATHETERIZATION  2000s X 1  . COLONOSCOPY WITH PROPOFOL N/A 02/01/2013   SLF: 1. 23 colon polyps removed. 2 retrieved. 2. Mild diverticulosis in teh sigmoid colon 3. Small internal hemorrhoids 4. The colon is redundant   . CYSTOSCOPY W/ STONE MANIPULATION    . ESOPHAGOGASTRODUODENOSCOPY (EGD) WITH PROPOFOL N/A 02/01/2013   SLF: 1. Moderate erosive gastritis  . [place]  . POLYPECTOMY N/A 02/01/2013   Procedure: POLYPECTOMY;  Surgeon: Danie Binder, MD;  Location: [facility];  Service: Endoscopy;  Laterality: N/A;    Current Medications: Outpatient Medications Prior to Visit  Medication Sig Dispense Refill  . acetaminophen (TYLENOL) 500 MG tablet  Take 1,000 mg by mouth every 6 (six) hours as needed for mild pain or moderate pain.     Marland Kitchen allopurinol (ZYLOPRIM) 300 MG tablet Take 300 mg by mouth daily.    Marland Kitchen aspirin EC 81 MG tablet Take 1 tablet (81 mg total) by mouth daily. 90 tablet 3  . atorvastatin (LIPITOR) 80 MG tablet Take 1 tablet (80 mg total) by mouth daily at 6 PM. 30 tablet 6  . clopidogrel (PLAVIX) 75 MG tablet Take 1 tablet (75 mg total) by mouth daily with breakfast. 30 tablet 11  . cyclobenzaprine (FLEXERIL) 5 MG tablet Take 1 tablet (5 mg total) by mouth  3 (three) times daily as needed for muscle spasms. 20 tablet 0  . DULoxetine (CYMBALTA) 60 MG capsule Take 60 mg by mouth daily.     . feeding supplement, ENSURE ENLIVE, (ENSURE ENLIVE) LIQD Take 237 mLs by mouth 2 (two) times daily between meals. 237 mL 12  . furosemide (LASIX) 40 MG tablet Take 1 tablet (40 mg total) by mouth daily. 30 tablet 0  . HYDROcodone-acetaminophen (NORCO/VICODIN) 5-325 MG tablet Take 1 tablet by mouth every 8 (eight) hours as needed for pain.    . isosorbide-hydrALAZINE (BIDIL) 20-37.5 MG tablet Take 1 tablet by mouth 3 (three) times daily. 90 tablet 0  . metFORMIN (GLUCOPHAGE) 500 MG tablet Take 500 mg by mouth daily.    . metoprolol succinate (TOPROL-XL) 50 MG 24 hr tablet TAKE 1.5 TABLETS DAILY 135 tablet 3  . nitroGLYCERIN (NITROSTAT) 0.4 MG SL tablet Place 1 tablet (0.4 mg total) under the tongue every 5 (five) minutes as needed for chest pain. 25 tablet 3  . pantoprazole (PROTONIX) 40 MG tablet Take 1 tablet (40 mg total) by mouth daily. 30 tablet 6  . ruxolitinib phosphate (JAKAFI) 20 MG tablet Take 20 mg by mouth 2 (two) times daily.    Marland Kitchen tiZANidine (ZANAFLEX) 4 MG capsule Take 4 mg by mouth 2 (two) times daily.    . traMADol (ULTRAM) 50 MG tablet Take 50 mg by mouth every 6 (six) hours as needed.    . traZODone (DESYREL) 50 MG tablet Take 50 mg by mouth at bedtime as needed for sleep.     No facility-administered medications prior to visit.      Allergies:   Hydrocodone   Social History   Social History  . Marital status: Divorced    Spouse name: N/A  . Number of children: N/A  . Years of education: N/A   Occupational History  . retired     Architect   Social History Main Topics  . Smoking status: Former Smoker    Packs/day: 1.00    Years: 20.00    Types: Cigars    Start date: 06/25/1975    Quit date: 08/04/2013  . Smokeless tobacco: Never Used  . 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  Drug use: No  . Sexual activity: Yes    Birth control/ protection: None   Other Topics Concern  . None   Social History Narrative  . None     Family History:  The patient's family history includes CVA in his mother; Heart attack in his father and mother.   ROS:   Please see the history of present illness.    Review of Systems  Constitution: Positive for weakness and malaise/fatigue.  HENT: Negative.   Cardiovascular: Positive for dyspnea on  exertion.  Respiratory: Positive for shortness of breath.   Endocrine: Negative.   Hematologic/Lymphatic: Negative.   Musculoskeletal: Negative.   Gastrointestinal: Positive for bloating.       Abdominal swelling from enlarged spleen  Genitourinary: Negative.    All other systems reviewed and are negative.   PHYSICAL EXAM:   VS:  BP (!) 150/70   Pulse 70   Ht 6' (1.829 m)   Wt 197 lb (89.4 kg)   SpO2 91%   BMI 26.72 kg/m   Physical Exam  GEN: Well nourished, well developed, in no acute distress Neck: no JVD, carotid bruits, or masses Cardiac:RRR; no murmurs, rubs, or gallops  Respiratory:  clear to auscultation bilaterally, normal work of breathing GI: soft, nontender, nondistended, + BS Ext: without cyanosis, clubbing, or edema, Good distal pulses bilaterally Psych: euthymic mood, full affect  Wt Readings from Last 3 Encounters:  10/01/16 197 lb (89.4 kg)  09/16/16 196 lb (88.9 kg)  09/04/16 191 lb (86.6 kg)      Studies/Labs Reviewed:   EKG:  EKG is not ordered today.     Recent Labs: 04/26/2016: ALT 19 09/15/2016: B Natriuretic Peptide 402.0; Hemoglobin 13.6; Platelets 437 09/16/2016: BUN 29; Creatinine, Ser 1.86; Potassium 3.9; Sodium 142   Lipid Panel No results found for: CHOL, TRIG, HDL, CHOLHDL, VLDL, LDLCALC, LDLDIRECT  Additional studies/ records that were reviewed today include:   Echocardiogram 09/15/2016: Study Conclusions   - Left ventricle: The cavity size was normal. Wall thickness was   increased  in a pattern of mild LVH. Systolic function was normal.   The estimated ejection fraction was in the range of 60% to 65%.   Wall motion was normal; there were no regional wall motion   abnormalities. Features are consistent with a pseudonormal left   ventricular filling pattern, with concomitant abnormal relaxation   and increased filling pressure (grade 2 diastolic dysfunction). - Aortic valve: Mildly calcified annulus. Trileaflet. There was   mild regurgitation. - Mitral valve: There was mild regurgitation. - Left atrium: The atrium was mildly to moderately dilated. - Right atrium: The atrium was mildly dilated. Central venous   pressure (est): 3 mm Hg. - Atrial septum: No defect or patent foramen ovale was identified. - Tricuspid valve: There was mild regurgitation. - Pulmonary arteries: PA peak pressure: 57 mm Hg (S). - Pericardium, extracardiac: A trivial pericardial effusion was   identified posterior to the heart.   Impressions:   - Mild LVH with LVEF 60-65% and grade 2 diastolic dysfunction. Mild   to moderate left atrial enlargement. Mild mitral regurgitation   and aortic regurgitation. Mild tricuspid regurgitation with   evidence of moderate pulmonary hypertension, PASP 57 mmHg.   Cardiac catheterization 1/23/18Conclusion     Dist RCA lesion, 60 %stenosed.  3rd Mrg lesion, 90 %stenosed. This was a small vessel.  Mid LAD lesion, 40 %stenosed.  Ost LAD lesion, 25 %stenosed.  Mid Cx lesion, 95 %stenosed. This was the culprit lesion. A STENT SYNERGY DES 3X20 drug eluting stent was successfully placed., postdilated to 3.5  Post intervention, there is a 0% residual stenosis.  LV end diastolic pressure is normal.  There is no aortic valve stenosis.  Significant tortuosity in the right subclavian. If emergency cath was needed in the future, would not use right radial approach.   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 A Synergy stent was used to give him  the benefit of a drug-eluting stent but also allow for shortening of antiplatelet  therapy duration. For now, continue dual antiplatelet therapy. His platelet count is normal. I will speak to Dr. Harl Bowie regarding this issue.        ASSESSMENT:    1. Chronic diastolic CHF (congestive heart failure) ([place])   2. Coronary artery disease due to lipid rich plaque   3. Essential hypertension   4. CKD (chronic kidney disease) stage 3, GFR 30-59 ml/min   5. 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 Continue current dose of Lasix. Check renal function today. Periods of shortness of breath could be related to his spleen pressing on his diaphragm. He has follow-up with oncology next week. Follow-up with Dr. Harl Bowie in one month.  CAD with recent stenting of the mid circumflex 08/12/16 with 3 sagittal disease as described above. Continue medical therapy.  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 Follow-up with oncology next week.      Medication Adjustments/Labs and Tests Ordered: Current medicines are reviewed at length with the patient today.  Concerns regarding medicines are outlined above.  Medication changes, Labs and Tests ordered today are listed in the Patient Instructions below. Patient Instructions  Your physician recommends that you schedule a follow-up appointment in: 1 Month with Dr. Harl Bowie  Your physician recommends that you continue on your current medications as directed. Please refer to the Current Medication list given to you today.  Your physician recommends that you return for lab work in: Today  If you need a refill on your cardiac medications before your next appointment, please call your pharmacy.  Thank you for choosing [facility]!        Sumner Boast, PA-C    10/01/2016 12:53 PM    [facility] [address], [place], [place]  [address] Phone: [phone]; Fax: [phone]

## 2016-10-01 NOTE — Patient Instructions (Signed)
Your physician recommends that you schedule a follow-up appointment in: 1 Month with Dr. Harl Bowie  Your physician recommends that you continue on your current medications as directed. Please refer to the Current Medication list given to you today.  Your physician recommends that you return for lab work in: Today  If you need a refill on your cardiac medications before your next appointment, please call your pharmacy.  Thank you for choosing [facility]!

## 2016-10-01 NOTE — Telephone Encounter (Signed)
-----   Message from Imogene Burn, PA-C sent at 10/01/2016  3:11 PM EDT ----- Potassium low needs K Dur 20 mEq 3 tablets today then 1 daily with Lasix. Kidney function a little better. Repeat bmet in 1 week

## 2016-10-07 DIAGNOSIS — D469 Myelodysplastic syndrome, unspecified: Secondary | ICD-10-CM | POA: Diagnosis not present

## 2016-10-14 DIAGNOSIS — D469 Myelodysplastic syndrome, unspecified: Secondary | ICD-10-CM | POA: Diagnosis not present

## 2016-10-14 DIAGNOSIS — Z7984 Long term (current) use of oral hypoglycemic drugs: Secondary | ICD-10-CM | POA: Diagnosis not present

## 2016-10-14 DIAGNOSIS — I10 Essential (primary) hypertension: Secondary | ICD-10-CM | POA: Diagnosis not present

## 2016-10-14 DIAGNOSIS — Z885 Allergy status to narcotic agent status: Secondary | ICD-10-CM | POA: Diagnosis not present

## 2016-10-14 DIAGNOSIS — N189 Chronic kidney disease, unspecified: Secondary | ICD-10-CM | POA: Diagnosis not present

## 2016-10-14 DIAGNOSIS — Z955 Presence of coronary angioplasty implant and graft: Secondary | ICD-10-CM | POA: Diagnosis not present

## 2016-10-14 DIAGNOSIS — Z79899 Other long term (current) drug therapy: Secondary | ICD-10-CM | POA: Diagnosis not present

## 2016-10-14 DIAGNOSIS — R079 Chest pain, unspecified: Secondary | ICD-10-CM | POA: Diagnosis not present

## 2016-10-14 DIAGNOSIS — I129 Hypertensive chronic kidney disease with stage 1 through stage 4 chronic kidney disease, or unspecified chronic kidney disease: Secondary | ICD-10-CM | POA: Diagnosis not present

## 2016-10-14 DIAGNOSIS — E1122 Type 2 diabetes mellitus with diabetic chronic kidney disease: Secondary | ICD-10-CM | POA: Diagnosis not present

## 2016-10-14 DIAGNOSIS — R627 Adult failure to thrive: Secondary | ICD-10-CM | POA: Diagnosis not present

## 2016-10-14 DIAGNOSIS — Z87891 Personal history of nicotine dependence: Secondary | ICD-10-CM | POA: Diagnosis not present

## 2016-10-15 DIAGNOSIS — R627 Adult failure to thrive: Secondary | ICD-10-CM | POA: Diagnosis not present

## 2016-10-15 DIAGNOSIS — D469 Myelodysplastic syndrome, unspecified: Secondary | ICD-10-CM | POA: Diagnosis not present

## 2016-10-15 DIAGNOSIS — J90 Pleural effusion, not elsewhere classified: Secondary | ICD-10-CM | POA: Diagnosis not present

## 2016-10-15 DIAGNOSIS — R161 Splenomegaly, not elsewhere classified: Secondary | ICD-10-CM | POA: Diagnosis not present

## 2016-10-15 DIAGNOSIS — D739 Disease of spleen, unspecified: Secondary | ICD-10-CM | POA: Diagnosis not present

## 2016-10-20 DIAGNOSIS — Z6828 Body mass index (BMI) 28.0-28.9, adult: Secondary | ICD-10-CM | POA: Diagnosis not present

## 2016-10-20 DIAGNOSIS — E1122 Type 2 diabetes mellitus with diabetic chronic kidney disease: Secondary | ICD-10-CM | POA: Diagnosis not present

## 2016-10-20 DIAGNOSIS — N183 Chronic kidney disease, stage 3 (moderate): Secondary | ICD-10-CM | POA: Diagnosis not present

## 2016-10-20 DIAGNOSIS — E7800 Pure hypercholesterolemia, unspecified: Secondary | ICD-10-CM | POA: Diagnosis not present

## 2016-10-20 DIAGNOSIS — Z299 Encounter for prophylactic measures, unspecified: Secondary | ICD-10-CM | POA: Diagnosis not present

## 2016-10-20 DIAGNOSIS — I10 Essential (primary) hypertension: Secondary | ICD-10-CM | POA: Diagnosis not present

## 2016-10-20 DIAGNOSIS — M545 Low back pain: Secondary | ICD-10-CM | POA: Diagnosis not present

## 2016-10-21 ENCOUNTER — Encounter: Payer: Self-pay | Admitting: Cardiology

## 2016-10-21 DIAGNOSIS — Z7984 Long term (current) use of oral hypoglycemic drugs: Secondary | ICD-10-CM | POA: Diagnosis not present

## 2016-10-21 DIAGNOSIS — R109 Unspecified abdominal pain: Secondary | ICD-10-CM | POA: Diagnosis not present

## 2016-10-21 DIAGNOSIS — Z79899 Other long term (current) drug therapy: Secondary | ICD-10-CM | POA: Diagnosis not present

## 2016-10-21 DIAGNOSIS — I998 Other disorder of circulatory system: Secondary | ICD-10-CM | POA: Diagnosis not present

## 2016-10-21 DIAGNOSIS — I509 Heart failure, unspecified: Secondary | ICD-10-CM | POA: Diagnosis not present

## 2016-10-21 DIAGNOSIS — E1122 Type 2 diabetes mellitus with diabetic chronic kidney disease: Secondary | ICD-10-CM | POA: Diagnosis not present

## 2016-10-21 DIAGNOSIS — Z885 Allergy status to narcotic agent status: Secondary | ICD-10-CM | POA: Diagnosis not present

## 2016-10-21 DIAGNOSIS — D469 Myelodysplastic syndrome, unspecified: Secondary | ICD-10-CM | POA: Diagnosis not present

## 2016-10-21 DIAGNOSIS — I130 Hypertensive heart and chronic kidney disease with heart failure and stage 1 through stage 4 chronic kidney disease, or unspecified chronic kidney disease: Secondary | ICD-10-CM | POA: Diagnosis not present

## 2016-10-21 DIAGNOSIS — Z87891 Personal history of nicotine dependence: Secondary | ICD-10-CM | POA: Diagnosis not present

## 2016-10-21 DIAGNOSIS — R627 Adult failure to thrive: Secondary | ICD-10-CM | POA: Diagnosis not present

## 2016-10-21 DIAGNOSIS — N189 Chronic kidney disease, unspecified: Secondary | ICD-10-CM | POA: Diagnosis not present

## 2016-10-28 DIAGNOSIS — S20222A Contusion of left back wall of thorax, initial encounter: Secondary | ICD-10-CM | POA: Diagnosis not present

## 2016-10-28 DIAGNOSIS — Z87891 Personal history of nicotine dependence: Secondary | ICD-10-CM | POA: Diagnosis not present

## 2016-10-28 DIAGNOSIS — Z885 Allergy status to narcotic agent status: Secondary | ICD-10-CM | POA: Diagnosis not present

## 2016-10-28 DIAGNOSIS — E1122 Type 2 diabetes mellitus with diabetic chronic kidney disease: Secondary | ICD-10-CM | POA: Diagnosis not present

## 2016-10-28 DIAGNOSIS — N189 Chronic kidney disease, unspecified: Secondary | ICD-10-CM | POA: Diagnosis not present

## 2016-10-28 DIAGNOSIS — Z7984 Long term (current) use of oral hypoglycemic drugs: Secondary | ICD-10-CM | POA: Diagnosis not present

## 2016-10-28 DIAGNOSIS — Z79899 Other long term (current) drug therapy: Secondary | ICD-10-CM | POA: Diagnosis not present

## 2016-10-28 DIAGNOSIS — I130 Hypertensive heart and chronic kidney disease with heart failure and stage 1 through stage 4 chronic kidney disease, or unspecified chronic kidney disease: Secondary | ICD-10-CM | POA: Diagnosis not present

## 2016-10-28 DIAGNOSIS — I509 Heart failure, unspecified: Secondary | ICD-10-CM | POA: Diagnosis not present

## 2016-10-28 DIAGNOSIS — Z955 Presence of coronary angioplasty implant and graft: Secondary | ICD-10-CM | POA: Diagnosis not present

## 2016-10-28 DIAGNOSIS — D469 Myelodysplastic syndrome, unspecified: Secondary | ICD-10-CM | POA: Diagnosis not present

## 2016-11-11 DIAGNOSIS — Z87891 Personal history of nicotine dependence: Secondary | ICD-10-CM | POA: Diagnosis not present

## 2016-11-11 DIAGNOSIS — S20222A Contusion of left back wall of thorax, initial encounter: Secondary | ICD-10-CM | POA: Diagnosis not present

## 2016-11-11 DIAGNOSIS — D469 Myelodysplastic syndrome, unspecified: Secondary | ICD-10-CM | POA: Diagnosis not present

## 2016-11-11 DIAGNOSIS — I509 Heart failure, unspecified: Secondary | ICD-10-CM | POA: Diagnosis not present

## 2016-11-11 DIAGNOSIS — I130 Hypertensive heart and chronic kidney disease with heart failure and stage 1 through stage 4 chronic kidney disease, or unspecified chronic kidney disease: Secondary | ICD-10-CM | POA: Diagnosis not present

## 2016-11-11 DIAGNOSIS — Z955 Presence of coronary angioplasty implant and graft: Secondary | ICD-10-CM | POA: Diagnosis not present

## 2016-11-11 DIAGNOSIS — N189 Chronic kidney disease, unspecified: Secondary | ICD-10-CM | POA: Diagnosis not present

## 2016-11-11 DIAGNOSIS — Z885 Allergy status to narcotic agent status: Secondary | ICD-10-CM | POA: Diagnosis not present

## 2016-11-11 DIAGNOSIS — Z79899 Other long term (current) drug therapy: Secondary | ICD-10-CM | POA: Diagnosis not present

## 2016-11-11 DIAGNOSIS — Z7984 Long term (current) use of oral hypoglycemic drugs: Secondary | ICD-10-CM | POA: Diagnosis not present

## 2016-11-13 ENCOUNTER — Ambulatory Visit: Payer: Medicare Other | Admitting: Cardiology

## 2016-11-13 ENCOUNTER — Encounter: Payer: Self-pay | Admitting: Cardiology

## 2016-11-13 NOTE — Progress Notes (Deleted)
Clinical Summary Mr. Biggins is a 71 y.o.male  1. CAD - nuclear stress test at [facility] showed large area of inferolateral ischemia. Referred for cath, though there was some initial delay due to ongoing myeloproliferative workup with multiple bone marrow biopsies as well as a chest wall hematoma.   - cath Jan 2018, received DES to LCX. 90% OM3 small vessel, treated medically. 60% RCA medically managed.  - headaches on nitroglycerin  - continues to have sharp pain midchest, 8/10 in severity. Occurrs while at rest. No other associated symptoms. Not related to food. Worst with deep breaths. Lasted 10-15 minutes. Similar to previous pain.    2. CKD -followed by nephrology, renal function has been improving.    3. Thrombocytopenia - . Underoing evalaution by heme for possible myeloproliferative disorder. Bone marrow biopsies have not been diagnostic. Currently followed at [facility] for myelodysplastic syndrome.  - last labs Jul 29, 2016: Hgb 14.2 Plt 257 Cr 1.49   4. Chronic diastolic HF - 10/4313 echo: LVEF 60-65%, grade II diastolic dysfunction, PASP 57 Past Medical History:  Diagnosis Date  . Anxiety   . Arthritis   . BPH (benign prostatic hyperplasia)   . CAD (coronary artery disease)    DES to circumflex 07/2016, moderate residual LAD and RCA, small 90% OM3 - managed medically  . Chronic lower back pain   . Depression   . Essential hypertension   . Gastritis   . GERD (gastroesophageal reflux disease)   . History of kidney stones   . 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[place])    Likely MDS/MPN, unclassifiable - folowed at [facility]  . Pneumonia X 1  . Renal insufficiency   . Spleen enlarged   . Type II diabetes mellitus (HCC)      Allergies  Allergen Reactions  . Hydrocodone Itching     Current Outpatient Prescriptions  Medication Sig Dispense Refill  . acetaminophen (TYLENOL) 500 MG tablet Take 1,000 mg by mouth every 6 (six) hours as needed for mild  pain or moderate pain.     Marland Kitchen allopurinol (ZYLOPRIM) 300 MG tablet Take 300 mg by mouth daily.    Marland Kitchen aspirin EC 81 MG tablet Take 1 tablet (81 mg total) by mouth daily. 90 tablet 3  . atorvastatin (LIPITOR) 80 MG tablet Take 1 tablet (80 mg total) by mouth daily at 6 PM. 30 tablet 6  . clopidogrel (PLAVIX) 75 MG tablet Take 1 tablet (75 mg total) by mouth daily with breakfast. 30 tablet 11  . cyclobenzaprine (FLEXERIL) 5 MG tablet Take 1 tablet (5 mg total) by mouth 3 (three) times daily as needed for muscle spasms. 20 tablet 0  . DULoxetine (CYMBALTA) 60 MG capsule Take 60 mg by mouth daily.     . feeding supplement, ENSURE ENLIVE, (ENSURE ENLIVE) LIQD Take 237 mLs by mouth 2 (two) times daily between meals. 237 mL 12  . furosemide (LASIX) 40 MG tablet Take 1 tablet (40 mg total) by mouth daily. 30 tablet 0  . HYDROcodone-acetaminophen (NORCO/VICODIN) 5-325 MG tablet Take 1 tablet by mouth every 8 (eight) hours as needed for pain.    . isosorbide-hydrALAZINE (BIDIL) 20-37.5 MG tablet Take 1 tablet by mouth 3 (three) times daily. 90 tablet 0  . metFORMIN (GLUCOPHAGE) 500 MG tablet Take 500 mg by mouth daily.    . metoprolol succinate (TOPROL-XL) 50 MG 24 hr tablet TAKE 1.5 TABLETS DAILY 135 tablet 3  . nitroGLYCERIN (NITROSTAT) 0.4 MG SL tablet Place 1 tablet (  0.4 mg total) under the tongue every 5 (five) minutes as needed for chest pain. 25 tablet 3  . pantoprazole (PROTONIX) 40 MG tablet Take 1 tablet (40 mg total) by mouth daily. 30 tablet 6  . potassium chloride SA (K-DUR,KLOR-CON) 20 MEQ tablet Take 1 tablet (20 mEq total) by mouth daily. 90 tablet 3  . ruxolitinib phosphate (JAKAFI) 20 MG tablet Take 20 mg by mouth 2 (two) times daily.    Marland Kitchen tiZANidine (ZANAFLEX) 4 MG capsule Take 4 mg by mouth 2 (two) times daily.    . traMADol (ULTRAM) 50 MG tablet Take 50 mg by mouth every 6 (six) hours as needed.    . traZODone (DESYREL) 50 MG tablet Take 50 mg by mouth at bedtime as needed for sleep.      No current facility-administered medications for this visit.      Past Surgical History:  Procedure Laterality Date  . BACK SURGERY    . BONE MARROW BIOPSY     x 3 times  . CARDIAC CATHETERIZATION N/A 08/12/2016   Procedure: Left Heart Cath and Coronary Angiography;  Surgeon: Jettie Booze, MD;  Location: [place] [facility];  Service: Cardiovascular;  Laterality: N/A;  . CARDIAC CATHETERIZATION N/A 08/12/2016   Procedure: Coronary Stent Intervention;  Surgeon: Jettie Booze, MD;  Location: [place] [facility];  Service: Cardiovascular;  Laterality: N/A;  . CARDIAC CATHETERIZATION  2000s X 1  . COLONOSCOPY WITH PROPOFOL N/A 02/01/2013   SLF: 1. 23 colon polyps removed. 2 retrieved. 2. Mild diverticulosis in teh sigmoid colon 3. Small internal hemorrhoids 4. The colon is redundant   . CYSTOSCOPY W/ STONE MANIPULATION    . ESOPHAGOGASTRODUODENOSCOPY (EGD) WITH PROPOFOL N/A 02/01/2013   SLF: 1. Moderate erosive gastritis  . [place]  . POLYPECTOMY N/A 02/01/2013   Procedure: POLYPECTOMY;  Surgeon: Danie Binder, MD;  Location: [facility];  Service: Endoscopy;  Laterality: N/A;     Allergies  Allergen Reactions  . Hydrocodone Itching      Family History  Problem Relation Age of Onset  . Heart attack Mother   . CVA Mother   . Heart attack Father   . Colon cancer Neg Hx      Social History Mr. Etienne reports that he quit smoking about 3 years ago. His smoking use included Cigars. He started smoking about 41 years ago. He has a 20.00 pack-year smoking history. He has never used smokeless tobacco. Mr. Wandell reports that he does not drink alcohol.   Review of Systems CONSTITUTIONAL: No weight loss, fever, chills, weakness or fatigue.  HEENT: Eyes: No visual loss, blurred vision, double vision or yellow sclerae.No hearing loss, sneezing, congestion, runny nose or sore throat.  SKIN: No rash or itching.  CARDIOVASCULAR:  RESPIRATORY: No shortness of  breath, cough or sputum.  GASTROINTESTINAL: No anorexia, nausea, vomiting or diarrhea. No abdominal pain or blood.  GENITOURINARY: No burning on urination, no polyuria NEUROLOGICAL: No headache, dizziness, syncope, paralysis, ataxia, numbness or tingling in the extremities. No change in bowel or bladder control.  MUSCULOSKELETAL: No muscle, back pain, joint pain or stiffness.  LYMPHATICS: No enlarged nodes. No history of splenectomy.  PSYCHIATRIC: No history of depression or anxiety.  ENDOCRINOLOGIC: No reports of sweating, cold or heat intolerance. No polyuria or polydipsia.  Marland Kitchen   Physical Examination There were no vitals filed for this visit. There were no vitals filed for this visit.  Gen: resting comfortably, no acute distress  HEENT: no scleral icterus, pupils equal round and reactive, no palptable cervical adenopathy,  CV Resp: Clear to auscultation bilaterally GI: abdomen is soft, non-tender, non-distended, normal bowel sounds, no hepatosplenomegaly MSK: extremities are warm, no edema.  Skin: warm, no rash Neuro:  no focal deficits Psych: appropriate affect   Diagnostic Studies Jan 2018 cath  Dist RCA lesion, 60 %stenosed.  3rd Mrg lesion, 90 %stenosed. This was a small vessel.  Mid LAD lesion, 40 %stenosed.  Ost LAD lesion, 25 %stenosed.  Mid Cx lesion, 95 %stenosed. This was the culprit lesion. A STENT SYNERGY DES 3X20 drug eluting stent was successfully placed., postdilated to 3.5  Post intervention, there is a 0% residual stenosis.  LV end diastolic pressure is normal.  There is no aortic valve stenosis.  Significant tortuosity in the right subclavian. If emergency cath was needed in the future, would not use right radial approach.  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 A Synergy stent was used to give him the benefit of a drug-eluting stent but also allow for shortening of antiplatelet therapy duration. For now, continue dual antiplatelet  therapy. His platelet count is normal. I will speak to Dr. Harl Bowie regarding this issue.    08/2016 echo Study Conclusions  - Left ventricle: The cavity size was normal. Wall thickness was   increased in a pattern of mild LVH. Systolic function was normal.   The estimated ejection fraction was in the range of 60% to 65%.   Wall motion was normal; there were no regional wall motion   abnormalities. Features are consistent with a pseudonormal left   ventricular filling pattern, with concomitant abnormal relaxation   and increased filling pressure (grade 2 diastolic dysfunction). - Aortic valve: Mildly calcified annulus. Trileaflet. There was   mild regurgitation. - Mitral valve: There was mild regurgitation. - Left atrium: The atrium was mildly to moderately dilated. - Right atrium: The atrium was mildly dilated. Central venous   pressure (est): 3 mm Hg. - Atrial septum: No defect or patent foramen ovale was identified. - Tricuspid valve: There was mild regurgitation. - Pulmonary arteries: PA peak pressure: 57 mm Hg (S). - Pericardium, extracardiac: A trivial pericardial effusion was   identified posterior to the heart.  Impressions:  - Mild LVH with LVEF 60-65% and grade 2 diastolic dysfunction. Mild   to moderate left atrial enlargement. Mild mitral regurgitation   and aortic regurgitation. Mild tricuspid regurgitation with   evidence of moderate pulmonary hypertension, PASP 57 mmHg.   Assessment and Plan  1.CAD/Chest pain - s/p recent stent - still with chest pain symptoms at times, fairly atypical. Unclear if CAD related. He did have a 90% small OM that was treated medically. - we will increase Toprol XL to 67m daily as additional antianginal  2. HTN - above goal, increase Toprol XL as described above   3. CKD  - renal function improved, at point where can proceed with cath.    JArnoldo Lenis M.D., F.A.C.C.

## 2016-11-17 ENCOUNTER — Ambulatory Visit: Payer: Medicare Other | Admitting: Cardiology

## 2016-11-18 DIAGNOSIS — N183 Chronic kidney disease, stage 3 (moderate): Secondary | ICD-10-CM | POA: Diagnosis not present

## 2016-11-18 DIAGNOSIS — K219 Gastro-esophageal reflux disease without esophagitis: Secondary | ICD-10-CM | POA: Diagnosis not present

## 2016-11-18 DIAGNOSIS — Z299 Encounter for prophylactic measures, unspecified: Secondary | ICD-10-CM | POA: Diagnosis not present

## 2016-11-18 DIAGNOSIS — E7800 Pure hypercholesterolemia, unspecified: Secondary | ICD-10-CM | POA: Diagnosis not present

## 2016-11-18 DIAGNOSIS — Z713 Dietary counseling and surveillance: Secondary | ICD-10-CM | POA: Diagnosis not present

## 2016-11-18 DIAGNOSIS — Z6828 Body mass index (BMI) 28.0-28.9, adult: Secondary | ICD-10-CM | POA: Diagnosis not present

## 2016-11-18 DIAGNOSIS — I10 Essential (primary) hypertension: Secondary | ICD-10-CM | POA: Diagnosis not present

## 2016-11-18 DIAGNOSIS — Z79899 Other long term (current) drug therapy: Secondary | ICD-10-CM | POA: Diagnosis not present

## 2016-11-18 DIAGNOSIS — M545 Low back pain: Secondary | ICD-10-CM | POA: Diagnosis not present

## 2016-11-19 ENCOUNTER — Encounter: Payer: Self-pay | Admitting: Cardiology

## 2016-11-19 ENCOUNTER — Ambulatory Visit (INDEPENDENT_AMBULATORY_CARE_PROVIDER_SITE_OTHER): Payer: Medicare Other | Admitting: Cardiology

## 2016-11-19 VITALS — BP 150/80 | HR 61 | Ht 72.0 in | Wt 192.0 lb

## 2016-11-19 DIAGNOSIS — I2583 Coronary atherosclerosis due to lipid rich plaque: Secondary | ICD-10-CM

## 2016-11-19 DIAGNOSIS — I10 Essential (primary) hypertension: Secondary | ICD-10-CM | POA: Diagnosis not present

## 2016-11-19 DIAGNOSIS — R0789 Other chest pain: Secondary | ICD-10-CM

## 2016-11-19 DIAGNOSIS — I2510 Atherosclerotic heart disease of native coronary artery without angina pectoris: Secondary | ICD-10-CM

## 2016-11-19 DIAGNOSIS — I5032 Chronic diastolic (congestive) heart failure: Secondary | ICD-10-CM

## 2016-11-19 MED ORDER — AMLODIPINE BESYLATE 5 MG PO TABS: 5.0000 mg | ORAL_TABLET | Freq: Every day | ORAL | 3 refills | Status: DC

## 2016-11-19 NOTE — Patient Instructions (Signed)
Medication Instructions:  START NORVASC 5 MG DAILY  Labwork: NONE  Testing/Procedures: NONE  Follow-Up: Your physician recommends that you schedule a follow-up appointment in: 6 MONTHS    Any Other Special Instructions Will Be Listed Below (If Applicable).     If you need a refill on your cardiac medications before your next appointment, please call your pharmacy.

## 2016-11-19 NOTE — Progress Notes (Signed)
Clinical Summary Mr. Lindblad is a 71 y.o.male seen today for follow up of the following medical problems.   1. Abnormal stress test - recent chest pain 3-4 week ago. Pressure like feeling midchest, 7/10 in severity. Isolated episode while sitting. +SOB. Had some diaphoresis. Lasted about 45 minutes. Not positional. Martin Majestic to [facility], refused admissions - since that time mild "twinges" I nchest. No SOB or DOE, though fairly sedentary lifestyle.  - nuclear stress test at [facility] showed large area of inferolateral ischemia.  - CAD risk factors: HTN, DM2, +tobacco cigars several years. Father CVA late 73s. Mother with CAD late 8s.   - admit 03/2016 with atypical chest pain, thought to be shoulder issue based on MRI. Admitted a few weeks later with left chest wall hematoma, unclear etiology.  - continues to have sharp pain midchest, 6/10 in severity. Left hand can go numb. +SOB. Worst with deep breaths. Lasts just a few seconds. Can occur at rest or with activity. Some increase in severithy.    - cath Jan 2018, received DES to LCX. 90% OM3 small vessel, treated medically. 60% RCA medically managed    - pain is improving. Compliant with meds. Can have postitional chest pain at times. No ACE-I due to poor renal function  2. CKD -followed by nephrology, renal function has been improving.    3. Thrombocytopenia - . Underoing evalaution by heme for possible myeloproliferative disorder. Bone marrow biopsies have not been diagnostic. Currently followed at [facility] for myelodysplastic syndrome.  - last labs Jul 29, 2016: Hgb 14.2 Plt 257 Cr 1.49   4. Chronic diastolic HF - no recent edema. No recent SOB or DOE.    5. HTN - compliant with meds Past Medical History:  Diagnosis Date  . Anxiety   . Arthritis   . BPH (benign prostatic hyperplasia)   . CAD (coronary artery disease)    DES to circumflex 07/2016, moderate residual LAD and RCA, small 90% OM3 - managed  medically  . Chronic lower back pain   . Depression   . Essential hypertension   . Gastritis   . GERD (gastroesophageal reflux disease)   . History of kidney stones   . 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[place])    Likely MDS/MPN, unclassifiable - folowed at [facility]  . Pneumonia X 1  . Renal insufficiency   . Spleen enlarged   . Type II diabetes mellitus (HCC)      Allergies  Allergen Reactions  . Hydrocodone Itching     Current Outpatient Prescriptions  Medication Sig Dispense Refill  . acetaminophen (TYLENOL) 500 MG tablet Take 1,000 mg by mouth every 6 (six) hours as needed for mild pain or moderate pain.     Marland Kitchen allopurinol (ZYLOPRIM) 300 MG tablet Take 300 mg by mouth daily.    Marland Kitchen aspirin EC 81 MG tablet Take 1 tablet (81 mg total) by mouth daily. 90 tablet 3  . atorvastatin (LIPITOR) 80 MG tablet Take 1 tablet (80 mg total) by mouth daily at 6 PM. 30 tablet 6  . clopidogrel (PLAVIX) 75 MG tablet Take 1 tablet (75 mg total) by mouth daily with breakfast. 30 tablet 11  . cyclobenzaprine (FLEXERIL) 5 MG tablet Take 1 tablet (5 mg total) by mouth 3 (three) times daily as needed for muscle spasms. 20 tablet 0  . DULoxetine (CYMBALTA) 60 MG capsule Take 60 mg by mouth daily.     . feeding supplement, ENSURE ENLIVE, (ENSURE ENLIVE) LIQD Take 237 mLs  by mouth 2 (two) times daily between meals. 237 mL 12  . furosemide (LASIX) 40 MG tablet Take 1 tablet (40 mg total) by mouth daily. 30 tablet 0  . HYDROcodone-acetaminophen (NORCO/VICODIN) 5-325 MG tablet Take 1 tablet by mouth every 8 (eight) hours as needed for pain.    . isosorbide-hydrALAZINE (BIDIL) 20-37.5 MG tablet Take 1 tablet by mouth 3 (three) times daily. 90 tablet 0  . metFORMIN (GLUCOPHAGE) 500 MG tablet Take 500 mg by mouth daily.    . metoprolol succinate (TOPROL-XL) 50 MG 24 hr tablet TAKE 1.5 TABLETS DAILY 135 tablet 3  . nitroGLYCERIN (NITROSTAT) 0.4 MG SL tablet Place 1 tablet (0.4 mg total) under the tongue every 5 (five) minutes  as needed for chest pain. 25 tablet 3  . pantoprazole (PROTONIX) 40 MG tablet Take 1 tablet (40 mg total) by mouth daily. 30 tablet 6  . potassium chloride SA (K-DUR,KLOR-CON) 20 MEQ tablet Take 1 tablet (20 mEq total) by mouth daily. 90 tablet 3  . ruxolitinib phosphate (JAKAFI) 20 MG tablet Take 20 mg by mouth 2 (two) times daily.    Marland Kitchen tiZANidine (ZANAFLEX) 4 MG capsule Take 4 mg by mouth 2 (two) times daily.    . traMADol (ULTRAM) 50 MG tablet Take 50 mg by mouth every 6 (six) hours as needed.    . traZODone (DESYREL) 50 MG tablet Take 50 mg by mouth at bedtime as needed for sleep.     No current facility-administered medications for this visit.      Past Surgical History:  Procedure Laterality Date  . BACK SURGERY    . BONE MARROW BIOPSY     x 3 times  . CARDIAC CATHETERIZATION N/A 08/12/2016   Procedure: Left Heart Cath and Coronary Angiography;  Surgeon: Jettie Booze, MD;  Location: [place] [facility];  Service: Cardiovascular;  Laterality: N/A;  . CARDIAC CATHETERIZATION N/A 08/12/2016   Procedure: Coronary Stent Intervention;  Surgeon: Jettie Booze, MD;  Location: [place] [facility];  Service: Cardiovascular;  Laterality: N/A;  . CARDIAC CATHETERIZATION  2000s X 1  . COLONOSCOPY WITH PROPOFOL N/A 02/01/2013   SLF: 1. 23 colon polyps removed. 2 retrieved. 2. Mild diverticulosis in teh sigmoid colon 3. Small internal hemorrhoids 4. The colon is redundant   . CYSTOSCOPY W/ STONE MANIPULATION    . ESOPHAGOGASTRODUODENOSCOPY (EGD) WITH PROPOFOL N/A 02/01/2013   SLF: 1. Moderate erosive gastritis  . [place]  . POLYPECTOMY N/A 02/01/2013   Procedure: POLYPECTOMY;  Surgeon: Danie Binder, MD;  Location: [facility];  Service: Endoscopy;  Laterality: N/A;     Allergies  Allergen Reactions  . Hydrocodone Itching      Family History  Problem Relation Age of Onset  . Heart attack Mother   . CVA Mother   . Heart attack Father   . Colon cancer Neg Hx       Social History Mr. Vora reports that he quit smoking about 3 years ago. His smoking use included Cigars. He started smoking about 41 years ago. He has a 20.00 pack-year smoking history. He has never used smokeless tobacco. Mr. Milke reports that he does not drink alcohol.   Review of Systems CONSTITUTIONAL: No weight loss, fever, chills, weakness or fatigue.  HEENT: Eyes: No visual loss, blurred vision, double vision or yellow sclerae.No hearing loss, sneezing, congestion, runny nose or sore throat.  SKIN: No rash or itching.  CARDIOVASCULAR: per hpi RESPIRATORY: No shortness of  breath, cough or sputum.  GASTROINTESTINAL: No anorexia, nausea, vomiting or diarrhea. No abdominal pain or blood.  GENITOURINARY: No burning on urination, no polyuria NEUROLOGICAL: No headache, dizziness, syncope, paralysis, ataxia, numbness or tingling in the extremities. No change in bowel or bladder control.  MUSCULOSKELETAL: No muscle, back pain, joint pain or stiffness.  LYMPHATICS: No enlarged nodes. No history of splenectomy.  PSYCHIATRIC: No history of depression or anxiety.  ENDOCRINOLOGIC: No reports of sweating, cold or heat intolerance. No polyuria or polydipsia.  Marland Kitchen   Physical Examination Vitals:   11/19/16 1445  BP: (!) 150/80  Pulse: 61   Vitals:   11/19/16 1445  Weight: 192 lb (87.1 kg)  Height: 6' (1.829 m)    Gen: resting comfortably, no acute distress HEENT: no scleral icterus, pupils equal round and reactive, no palptable cervical adenopathy,  CV: RRR, no m/r/g, no jvd Resp: Clear to auscultation bilaterally GI: abdomen is soft, non-tender, non-distended, normal bowel sounds, no hepatosplenomegaly MSK: extremities are warm, no edema.  Skin: warm, no rash Neuro:  no focal deficits Psych: appropriate affect   Diagnostic Studies Jan 2018 cath  Dist RCA lesion, 60 %stenosed.  3rd Mrg lesion, 90 %stenosed. This was a small vessel.  Mid LAD lesion, 40  %stenosed.  Ost LAD lesion, 25 %stenosed.  Mid Cx lesion, 95 %stenosed. This was the culprit lesion. A STENT SYNERGY DES 3X20 drug eluting stent was successfully placed., postdilated to 3.5  Post intervention, there is a 0% residual stenosis.  LV end diastolic pressure is normal.  There is no aortic valve stenosis.  Significant tortuosity in the right subclavian. If emergency cath was needed in the future, would not use right radial approach.  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 A Synergy stent was used to give him the benefit of a drug-eluting stent but also allow for shortening of antiplatelet therapy duration. For now, continue dual antiplatelet therapy. His platelet count is normal. I will speak to Dr. Harl Bowie regarding this issue.     Assessment and Plan  1.CAD/Chest pain - s/p recent stent - atypical chest pain at times. We will continue current meds  2. HTN - above goal, we will start norvasc '5mg'$  daily.  3. Chronic diastolic HF - appears euvolemic, conitnue current meds    Arnoldo Lenis, M.D.

## 2016-12-05 DIAGNOSIS — I509 Heart failure, unspecified: Secondary | ICD-10-CM | POA: Diagnosis not present

## 2016-12-05 DIAGNOSIS — T148XXD Other injury of unspecified body region, subsequent encounter: Secondary | ICD-10-CM | POA: Diagnosis not present

## 2016-12-05 DIAGNOSIS — D469 Myelodysplastic syndrome, unspecified: Secondary | ICD-10-CM | POA: Diagnosis not present

## 2016-12-19 DIAGNOSIS — E7800 Pure hypercholesterolemia, unspecified: Secondary | ICD-10-CM | POA: Diagnosis not present

## 2016-12-19 DIAGNOSIS — D696 Thrombocytopenia, unspecified: Secondary | ICD-10-CM | POA: Diagnosis not present

## 2016-12-19 DIAGNOSIS — F329 Major depressive disorder, single episode, unspecified: Secondary | ICD-10-CM | POA: Diagnosis not present

## 2016-12-19 DIAGNOSIS — E785 Hyperlipidemia, unspecified: Secondary | ICD-10-CM | POA: Diagnosis not present

## 2016-12-19 DIAGNOSIS — Z299 Encounter for prophylactic measures, unspecified: Secondary | ICD-10-CM | POA: Diagnosis not present

## 2016-12-19 DIAGNOSIS — N183 Chronic kidney disease, stage 3 (moderate): Secondary | ICD-10-CM | POA: Diagnosis not present

## 2016-12-19 DIAGNOSIS — K219 Gastro-esophageal reflux disease without esophagitis: Secondary | ICD-10-CM | POA: Diagnosis not present

## 2016-12-19 DIAGNOSIS — E1122 Type 2 diabetes mellitus with diabetic chronic kidney disease: Secondary | ICD-10-CM | POA: Diagnosis not present

## 2016-12-19 DIAGNOSIS — I10 Essential (primary) hypertension: Secondary | ICD-10-CM | POA: Diagnosis not present

## 2016-12-19 DIAGNOSIS — Z6829 Body mass index (BMI) 29.0-29.9, adult: Secondary | ICD-10-CM | POA: Diagnosis not present

## 2016-12-19 DIAGNOSIS — I2510 Atherosclerotic heart disease of native coronary artery without angina pectoris: Secondary | ICD-10-CM | POA: Diagnosis not present

## 2016-12-19 DIAGNOSIS — D471 Chronic myeloproliferative disease: Secondary | ICD-10-CM | POA: Diagnosis not present

## 2016-12-26 DIAGNOSIS — R109 Unspecified abdominal pain: Secondary | ICD-10-CM | POA: Diagnosis not present

## 2016-12-26 DIAGNOSIS — R350 Frequency of micturition: Secondary | ICD-10-CM | POA: Diagnosis not present

## 2016-12-26 DIAGNOSIS — D469 Myelodysplastic syndrome, unspecified: Secondary | ICD-10-CM | POA: Diagnosis not present

## 2017-01-01 ENCOUNTER — Other Ambulatory Visit (HOSPITAL_COMMUNITY)
Admission: RE | Admit: 2017-01-01 | Discharge: 2017-01-01 | Disposition: A | Discharge status: Home / Self Care | Payer: Medicare Other | Source: Ambulatory Visit | Attending: Internal Medicine | Admitting: Internal Medicine

## 2017-01-01 DIAGNOSIS — D469 Myelodysplastic syndrome, unspecified: Secondary | ICD-10-CM | POA: Diagnosis not present

## 2017-01-01 LAB — COMPREHENSIVE METABOLIC PANEL
ALT: 30 U/L (ref 17–63)
ANION GAP: 7 (ref 5–15)
AST: 28 U/L (ref 15–41)
Albumin: 3.7 g/dL (ref 3.5–5.0)
Alkaline Phosphatase: 57 U/L (ref 38–126)
BUN: 23 mg/dL — ABNORMAL HIGH (ref 6–20)
CO2: 23 mmol/L (ref 22–32)
Calcium: 8.9 mg/dL (ref 8.9–10.3)
Chloride: 108 mmol/L (ref 101–111)
Creatinine, Ser: 1.72 mg/dL — ABNORMAL HIGH (ref 0.61–1.24)
GFR calc Af Amer: 44 mL/min — ABNORMAL LOW (ref >60)
GFR calc non Af Amer: 38 mL/min — ABNORMAL LOW (ref >60)
Glucose, Bld: 115 mg/dL — ABNORMAL HIGH (ref 65–99)
POTASSIUM: 5.1 mmol/L (ref 3.5–5.1)
SODIUM: 138 mmol/L (ref 135–145)
TOTAL PROTEIN: 5.9 g/dL — AB (ref 6.5–8.1)
Total Bilirubin: 0.7 mg/dL (ref 0.3–1.2)

## 2017-01-01 LAB — CBC WITH DIFFERENTIAL/PLATELET
BAND NEUTROPHILS: 10 %
BASOS ABS: 0.5 10*3/uL — AB (ref 0.0–0.1)
BLASTS: 0 %
Basophils Relative: 6 %
EOS ABS: 0.2 10*3/uL (ref 0.0–0.7)
EOS PCT: 3 %
HCT: 34.4 % — ABNORMAL LOW (ref 39.0–52.0)
Hemoglobin: 11.9 g/dL — ABNORMAL LOW (ref 13.0–17.0)
LYMPHS ABS: 3.2 10*3/uL (ref 0.7–4.0)
Lymphocytes Relative: 43 %
MCH: 31.4 pg (ref 26.0–34.0)
MCHC: 34.6 g/dL (ref 30.0–36.0)
MCV: 90.8 fL (ref 78.0–100.0)
METAMYELOCYTES PCT: 9 %
MONO ABS: 0.1 10*3/uL (ref 0.1–1.0)
Monocytes Relative: 1 %
Myelocytes: 5 %
NEUTROS ABS: 3.5 10*3/uL (ref 1.7–7.7)
Neutrophils Relative %: 23 %
Other: 0 %
Platelets: 60 10*3/uL — ABNORMAL LOW (ref 150–400)
Promyelocytes Absolute: 0 %
RBC: 3.79 MIL/uL — ABNORMAL LOW (ref 4.22–5.81)
RDW: 21.2 % — AB (ref 11.5–15.5)
WBC: 7.5 10*3/uL (ref 4.0–10.5)
nRBC: 2 /100 WBC — ABNORMAL HIGH

## 2017-01-19 DIAGNOSIS — I2510 Atherosclerotic heart disease of native coronary artery without angina pectoris: Secondary | ICD-10-CM | POA: Diagnosis not present

## 2017-01-19 DIAGNOSIS — Z713 Dietary counseling and surveillance: Secondary | ICD-10-CM | POA: Diagnosis not present

## 2017-01-19 DIAGNOSIS — Z299 Encounter for prophylactic measures, unspecified: Secondary | ICD-10-CM | POA: Diagnosis not present

## 2017-01-19 DIAGNOSIS — D696 Thrombocytopenia, unspecified: Secondary | ICD-10-CM | POA: Diagnosis not present

## 2017-01-19 DIAGNOSIS — N183 Chronic kidney disease, stage 3 (moderate): Secondary | ICD-10-CM | POA: Diagnosis not present

## 2017-01-19 DIAGNOSIS — M545 Low back pain: Secondary | ICD-10-CM | POA: Diagnosis not present

## 2017-01-19 DIAGNOSIS — Z6830 Body mass index (BMI) 30.0-30.9, adult: Secondary | ICD-10-CM | POA: Diagnosis not present

## 2017-01-19 DIAGNOSIS — E1122 Type 2 diabetes mellitus with diabetic chronic kidney disease: Secondary | ICD-10-CM | POA: Diagnosis not present

## 2017-01-19 DIAGNOSIS — Z79899 Other long term (current) drug therapy: Secondary | ICD-10-CM | POA: Diagnosis not present

## 2017-01-20 DIAGNOSIS — D469 Myelodysplastic syndrome, unspecified: Secondary | ICD-10-CM | POA: Diagnosis not present

## 2017-01-20 DIAGNOSIS — D731 Hypersplenism: Secondary | ICD-10-CM | POA: Diagnosis not present

## 2017-01-23 DIAGNOSIS — R9431 Abnormal electrocardiogram [ECG] [EKG]: Secondary | ICD-10-CM | POA: Diagnosis not present

## 2017-01-23 DIAGNOSIS — Z7902 Long term (current) use of antithrombotics/antiplatelets: Secondary | ICD-10-CM | POA: Diagnosis not present

## 2017-01-23 DIAGNOSIS — N189 Chronic kidney disease, unspecified: Secondary | ICD-10-CM | POA: Diagnosis present

## 2017-01-23 DIAGNOSIS — Z7984 Long term (current) use of oral hypoglycemic drugs: Secondary | ICD-10-CM | POA: Diagnosis not present

## 2017-01-23 DIAGNOSIS — D691 Qualitative platelet defects: Secondary | ICD-10-CM | POA: Diagnosis present

## 2017-01-23 DIAGNOSIS — Z79899 Other long term (current) drug therapy: Secondary | ICD-10-CM | POA: Diagnosis not present

## 2017-01-23 DIAGNOSIS — K219 Gastro-esophageal reflux disease without esophagitis: Secondary | ICD-10-CM | POA: Diagnosis present

## 2017-01-23 DIAGNOSIS — R1031 Right lower quadrant pain: Secondary | ICD-10-CM | POA: Diagnosis not present

## 2017-01-23 DIAGNOSIS — Z7982 Long term (current) use of aspirin: Secondary | ICD-10-CM | POA: Diagnosis not present

## 2017-01-23 DIAGNOSIS — E875 Hyperkalemia: Secondary | ICD-10-CM | POA: Diagnosis present

## 2017-01-23 DIAGNOSIS — Z87891 Personal history of nicotine dependence: Secondary | ICD-10-CM | POA: Diagnosis not present

## 2017-01-23 DIAGNOSIS — I2510 Atherosclerotic heart disease of native coronary artery without angina pectoris: Secondary | ICD-10-CM | POA: Diagnosis present

## 2017-01-23 DIAGNOSIS — D469 Myelodysplastic syndrome, unspecified: Secondary | ICD-10-CM | POA: Diagnosis present

## 2017-01-23 DIAGNOSIS — I129 Hypertensive chronic kidney disease with stage 1 through stage 4 chronic kidney disease, or unspecified chronic kidney disease: Secondary | ICD-10-CM | POA: Diagnosis present

## 2017-01-23 DIAGNOSIS — R001 Bradycardia, unspecified: Secondary | ICD-10-CM | POA: Diagnosis present

## 2017-01-23 DIAGNOSIS — Z955 Presence of coronary angioplasty implant and graft: Secondary | ICD-10-CM | POA: Diagnosis not present

## 2017-01-23 DIAGNOSIS — Z885 Allergy status to narcotic agent status: Secondary | ICD-10-CM | POA: Diagnosis not present

## 2017-01-23 DIAGNOSIS — E1122 Type 2 diabetes mellitus with diabetic chronic kidney disease: Secondary | ICD-10-CM | POA: Diagnosis present

## 2017-01-23 DIAGNOSIS — N179 Acute kidney failure, unspecified: Secondary | ICD-10-CM | POA: Diagnosis present

## 2017-01-23 DIAGNOSIS — R109 Unspecified abdominal pain: Secondary | ICD-10-CM | POA: Diagnosis present

## 2017-02-03 DIAGNOSIS — D469 Myelodysplastic syndrome, unspecified: Secondary | ICD-10-CM | POA: Diagnosis not present

## 2017-02-05 DIAGNOSIS — D471 Chronic myeloproliferative disease: Secondary | ICD-10-CM | POA: Diagnosis not present

## 2017-02-05 DIAGNOSIS — N400 Enlarged prostate without lower urinary tract symptoms: Secondary | ICD-10-CM | POA: Diagnosis not present

## 2017-02-05 DIAGNOSIS — Z299 Encounter for prophylactic measures, unspecified: Secondary | ICD-10-CM | POA: Diagnosis not present

## 2017-02-05 DIAGNOSIS — E1122 Type 2 diabetes mellitus with diabetic chronic kidney disease: Secondary | ICD-10-CM | POA: Diagnosis not present

## 2017-02-05 DIAGNOSIS — E875 Hyperkalemia: Secondary | ICD-10-CM | POA: Diagnosis not present

## 2017-02-05 DIAGNOSIS — Z6831 Body mass index (BMI) 31.0-31.9, adult: Secondary | ICD-10-CM | POA: Diagnosis not present

## 2017-02-05 DIAGNOSIS — N183 Chronic kidney disease, stage 3 (moderate): Secondary | ICD-10-CM | POA: Diagnosis not present

## 2017-02-05 DIAGNOSIS — I10 Essential (primary) hypertension: Secondary | ICD-10-CM | POA: Diagnosis not present

## 2017-02-05 DIAGNOSIS — E785 Hyperlipidemia, unspecified: Secondary | ICD-10-CM | POA: Diagnosis not present

## 2017-02-05 DIAGNOSIS — D696 Thrombocytopenia, unspecified: Secondary | ICD-10-CM | POA: Diagnosis not present

## 2017-02-05 DIAGNOSIS — F329 Major depressive disorder, single episode, unspecified: Secondary | ICD-10-CM | POA: Diagnosis not present

## 2017-02-10 DIAGNOSIS — D731 Hypersplenism: Secondary | ICD-10-CM | POA: Diagnosis not present

## 2017-02-10 DIAGNOSIS — D469 Myelodysplastic syndrome, unspecified: Secondary | ICD-10-CM | POA: Diagnosis not present

## 2017-02-10 DIAGNOSIS — R161 Splenomegaly, not elsewhere classified: Secondary | ICD-10-CM | POA: Diagnosis not present

## 2017-02-10 DIAGNOSIS — D7389 Other diseases of spleen: Secondary | ICD-10-CM | POA: Diagnosis not present

## 2017-02-10 DIAGNOSIS — D72829 Elevated white blood cell count, unspecified: Secondary | ICD-10-CM | POA: Diagnosis not present

## 2017-02-19 DIAGNOSIS — M545 Low back pain: Secondary | ICD-10-CM | POA: Diagnosis not present

## 2017-02-19 DIAGNOSIS — N183 Chronic kidney disease, stage 3 (moderate): Secondary | ICD-10-CM | POA: Diagnosis not present

## 2017-02-19 DIAGNOSIS — N400 Enlarged prostate without lower urinary tract symptoms: Secondary | ICD-10-CM | POA: Diagnosis not present

## 2017-02-19 DIAGNOSIS — Z6831 Body mass index (BMI) 31.0-31.9, adult: Secondary | ICD-10-CM | POA: Diagnosis not present

## 2017-02-19 DIAGNOSIS — E785 Hyperlipidemia, unspecified: Secondary | ICD-10-CM | POA: Diagnosis not present

## 2017-02-19 DIAGNOSIS — D471 Chronic myeloproliferative disease: Secondary | ICD-10-CM | POA: Diagnosis not present

## 2017-02-19 DIAGNOSIS — F329 Major depressive disorder, single episode, unspecified: Secondary | ICD-10-CM | POA: Diagnosis not present

## 2017-02-19 DIAGNOSIS — I10 Essential (primary) hypertension: Secondary | ICD-10-CM | POA: Diagnosis not present

## 2017-02-19 DIAGNOSIS — Z79899 Other long term (current) drug therapy: Secondary | ICD-10-CM | POA: Diagnosis not present

## 2017-02-19 DIAGNOSIS — E1122 Type 2 diabetes mellitus with diabetic chronic kidney disease: Secondary | ICD-10-CM | POA: Diagnosis not present

## 2017-02-19 DIAGNOSIS — I2510 Atherosclerotic heart disease of native coronary artery without angina pectoris: Secondary | ICD-10-CM | POA: Diagnosis not present

## 2017-02-19 DIAGNOSIS — Z299 Encounter for prophylactic measures, unspecified: Secondary | ICD-10-CM | POA: Diagnosis not present

## 2017-03-03 DIAGNOSIS — N189 Chronic kidney disease, unspecified: Secondary | ICD-10-CM | POA: Diagnosis not present

## 2017-03-03 DIAGNOSIS — Z885 Allergy status to narcotic agent status: Secondary | ICD-10-CM | POA: Diagnosis not present

## 2017-03-03 DIAGNOSIS — G893 Neoplasm related pain (acute) (chronic): Secondary | ICD-10-CM | POA: Diagnosis not present

## 2017-03-03 DIAGNOSIS — Z955 Presence of coronary angioplasty implant and graft: Secondary | ICD-10-CM | POA: Diagnosis not present

## 2017-03-03 DIAGNOSIS — I509 Heart failure, unspecified: Secondary | ICD-10-CM | POA: Diagnosis not present

## 2017-03-03 DIAGNOSIS — D72829 Elevated white blood cell count, unspecified: Secondary | ICD-10-CM | POA: Diagnosis not present

## 2017-03-03 DIAGNOSIS — R161 Splenomegaly, not elsewhere classified: Secondary | ICD-10-CM | POA: Diagnosis not present

## 2017-03-03 DIAGNOSIS — I130 Hypertensive heart and chronic kidney disease with heart failure and stage 1 through stage 4 chronic kidney disease, or unspecified chronic kidney disease: Secondary | ICD-10-CM | POA: Diagnosis not present

## 2017-03-03 DIAGNOSIS — Z79899 Other long term (current) drug therapy: Secondary | ICD-10-CM | POA: Diagnosis not present

## 2017-03-03 DIAGNOSIS — Z7984 Long term (current) use of oral hypoglycemic drugs: Secondary | ICD-10-CM | POA: Diagnosis not present

## 2017-03-03 DIAGNOSIS — Z87891 Personal history of nicotine dependence: Secondary | ICD-10-CM | POA: Diagnosis not present

## 2017-03-03 DIAGNOSIS — Z7982 Long term (current) use of aspirin: Secondary | ICD-10-CM | POA: Diagnosis not present

## 2017-03-03 DIAGNOSIS — Z87442 Personal history of urinary calculi: Secondary | ICD-10-CM | POA: Diagnosis not present

## 2017-03-03 DIAGNOSIS — D469 Myelodysplastic syndrome, unspecified: Secondary | ICD-10-CM | POA: Diagnosis not present

## 2017-03-03 DIAGNOSIS — R109 Unspecified abdominal pain: Secondary | ICD-10-CM | POA: Diagnosis not present

## 2017-03-19 ENCOUNTER — Telehealth: Payer: Self-pay

## 2017-03-19 ENCOUNTER — Telehealth: Payer: Self-pay | Admitting: Cardiology

## 2017-03-19 DIAGNOSIS — E875 Hyperkalemia: Secondary | ICD-10-CM | POA: Diagnosis not present

## 2017-03-19 DIAGNOSIS — Z7984 Long term (current) use of oral hypoglycemic drugs: Secondary | ICD-10-CM | POA: Diagnosis not present

## 2017-03-19 DIAGNOSIS — D469 Myelodysplastic syndrome, unspecified: Secondary | ICD-10-CM | POA: Diagnosis not present

## 2017-03-19 DIAGNOSIS — N184 Chronic kidney disease, stage 4 (severe): Secondary | ICD-10-CM | POA: Diagnosis not present

## 2017-03-19 DIAGNOSIS — Z79899 Other long term (current) drug therapy: Secondary | ICD-10-CM | POA: Diagnosis not present

## 2017-03-19 DIAGNOSIS — Z7902 Long term (current) use of antithrombotics/antiplatelets: Secondary | ICD-10-CM | POA: Diagnosis not present

## 2017-03-19 DIAGNOSIS — N183 Chronic kidney disease, stage 3 (moderate): Secondary | ICD-10-CM | POA: Diagnosis not present

## 2017-03-19 DIAGNOSIS — D649 Anemia, unspecified: Secondary | ICD-10-CM | POA: Diagnosis not present

## 2017-03-19 DIAGNOSIS — Z885 Allergy status to narcotic agent status: Secondary | ICD-10-CM | POA: Diagnosis not present

## 2017-03-19 DIAGNOSIS — Z87891 Personal history of nicotine dependence: Secondary | ICD-10-CM | POA: Diagnosis not present

## 2017-03-19 DIAGNOSIS — Z7982 Long term (current) use of aspirin: Secondary | ICD-10-CM | POA: Diagnosis not present

## 2017-03-19 DIAGNOSIS — I129 Hypertensive chronic kidney disease with stage 1 through stage 4 chronic kidney disease, or unspecified chronic kidney disease: Secondary | ICD-10-CM | POA: Diagnosis not present

## 2017-03-19 DIAGNOSIS — E1122 Type 2 diabetes mellitus with diabetic chronic kidney disease: Secondary | ICD-10-CM | POA: Diagnosis not present

## 2017-03-19 DIAGNOSIS — K219 Gastro-esophageal reflux disease without esophagitis: Secondary | ICD-10-CM | POA: Diagnosis not present

## 2017-03-19 DIAGNOSIS — R001 Bradycardia, unspecified: Secondary | ICD-10-CM | POA: Diagnosis not present

## 2017-03-19 DIAGNOSIS — E119 Type 2 diabetes mellitus without complications: Secondary | ICD-10-CM | POA: Diagnosis not present

## 2017-03-19 NOTE — Telephone Encounter (Signed)
-----   Message from Arnoldo Lenis, MD sent at 03/19/2017 12:52 PM EDT ----- Please let patient know that we received message from his nephrologist at [facility] about low heart rates in the low 50s, and they asked him to hold his metoprolol until hearing from [place]. Verify he has been taking Toprol XL 75mg  daily, and if so would lower to 50mg  daily. Can he come in Monday for a nursing visit and vitals check and EKG  Carlyle Dolly MD

## 2017-03-19 NOTE — Telephone Encounter (Signed)
Called pt., no answer. Left message for pt. To return call.  

## 2017-03-20 ENCOUNTER — Telehealth: Payer: Self-pay

## 2017-03-20 DIAGNOSIS — I10 Essential (primary) hypertension: Secondary | ICD-10-CM | POA: Diagnosis not present

## 2017-03-20 DIAGNOSIS — Z299 Encounter for prophylactic measures, unspecified: Secondary | ICD-10-CM | POA: Diagnosis not present

## 2017-03-20 DIAGNOSIS — N183 Chronic kidney disease, stage 3 (moderate): Secondary | ICD-10-CM | POA: Diagnosis not present

## 2017-03-20 DIAGNOSIS — Z6831 Body mass index (BMI) 31.0-31.9, adult: Secondary | ICD-10-CM | POA: Diagnosis not present

## 2017-03-20 DIAGNOSIS — E1122 Type 2 diabetes mellitus with diabetic chronic kidney disease: Secondary | ICD-10-CM | POA: Diagnosis not present

## 2017-03-20 DIAGNOSIS — D471 Chronic myeloproliferative disease: Secondary | ICD-10-CM | POA: Diagnosis not present

## 2017-03-20 DIAGNOSIS — M1710 Unilateral primary osteoarthritis, unspecified knee: Secondary | ICD-10-CM | POA: Diagnosis not present

## 2017-03-20 DIAGNOSIS — E785 Hyperlipidemia, unspecified: Secondary | ICD-10-CM | POA: Diagnosis not present

## 2017-03-20 DIAGNOSIS — N400 Enlarged prostate without lower urinary tract symptoms: Secondary | ICD-10-CM | POA: Diagnosis not present

## 2017-03-20 DIAGNOSIS — F329 Major depressive disorder, single episode, unspecified: Secondary | ICD-10-CM | POA: Diagnosis not present

## 2017-03-20 DIAGNOSIS — K219 Gastro-esophageal reflux disease without esophagitis: Secondary | ICD-10-CM | POA: Diagnosis not present

## 2017-03-20 DIAGNOSIS — M545 Low back pain: Secondary | ICD-10-CM | POA: Diagnosis not present

## 2017-03-20 MED ORDER — METOPROLOL SUCCINATE ER 50 MG PO TB24: 50.0000 mg | ORAL_TABLET | Freq: Every day | ORAL | 3 refills | Status: DC

## 2017-03-20 NOTE — Telephone Encounter (Signed)
I spoke with pt, he was taking 2 -50 mg tablets daily for a total of 100 mg.I asked him to take only one tablet 50 mg daily, he agreed.has nurse apt next week for VS and EKG

## 2017-03-20 NOTE — Telephone Encounter (Signed)
-----   Message from Arnoldo Lenis, MD sent at 03/19/2017 12:52 PM EDT ----- Please let patient know that we received message from his nephrologist at [facility] about low heart rates in the low 50s, and they asked him to hold his metoprolol until hearing from [place]. Verify he has been taking Toprol XL 75mg  daily, and if so would lower to 50mg  daily. Can he come in Monday for a nursing visit and vitals check and EKG  Carlyle Dolly MD

## 2017-03-24 DIAGNOSIS — R161 Splenomegaly, not elsewhere classified: Secondary | ICD-10-CM | POA: Diagnosis not present

## 2017-03-24 DIAGNOSIS — N189 Chronic kidney disease, unspecified: Secondary | ICD-10-CM | POA: Diagnosis not present

## 2017-03-24 DIAGNOSIS — I509 Heart failure, unspecified: Secondary | ICD-10-CM | POA: Diagnosis not present

## 2017-03-24 DIAGNOSIS — R109 Unspecified abdominal pain: Secondary | ICD-10-CM | POA: Diagnosis not present

## 2017-03-24 DIAGNOSIS — Z87891 Personal history of nicotine dependence: Secondary | ICD-10-CM | POA: Diagnosis not present

## 2017-03-24 DIAGNOSIS — Z87442 Personal history of urinary calculi: Secondary | ICD-10-CM | POA: Diagnosis not present

## 2017-03-24 DIAGNOSIS — Z7982 Long term (current) use of aspirin: Secondary | ICD-10-CM | POA: Diagnosis not present

## 2017-03-24 DIAGNOSIS — I130 Hypertensive heart and chronic kidney disease with heart failure and stage 1 through stage 4 chronic kidney disease, or unspecified chronic kidney disease: Secondary | ICD-10-CM | POA: Diagnosis not present

## 2017-03-24 DIAGNOSIS — Z885 Allergy status to narcotic agent status: Secondary | ICD-10-CM | POA: Diagnosis not present

## 2017-03-24 DIAGNOSIS — E1122 Type 2 diabetes mellitus with diabetic chronic kidney disease: Secondary | ICD-10-CM | POA: Diagnosis not present

## 2017-03-24 DIAGNOSIS — Z79899 Other long term (current) drug therapy: Secondary | ICD-10-CM | POA: Diagnosis not present

## 2017-03-24 DIAGNOSIS — D469 Myelodysplastic syndrome, unspecified: Secondary | ICD-10-CM | POA: Diagnosis not present

## 2017-03-24 DIAGNOSIS — Z955 Presence of coronary angioplasty implant and graft: Secondary | ICD-10-CM | POA: Diagnosis not present

## 2017-03-25 ENCOUNTER — Ambulatory Visit (INDEPENDENT_AMBULATORY_CARE_PROVIDER_SITE_OTHER): Payer: Medicare Other

## 2017-03-25 VITALS — BP 144/82 | HR 59 | Ht 72.0 in | Wt 207.0 lb

## 2017-03-25 DIAGNOSIS — I10 Essential (primary) hypertension: Secondary | ICD-10-CM

## 2017-03-25 DIAGNOSIS — R001 Bradycardia, unspecified: Secondary | ICD-10-CM

## 2017-03-25 NOTE — Progress Notes (Signed)
Pt came in for nurse visit. He brought in all of his medications for [place] to review today as well. He has had a lot of medication changes recently. I have updated the list. He has no complaints today and states that he feels good. I took vitals and got an EKG. I will forward it to Dr. Harl Bowie to review.

## 2017-03-26 NOTE — Progress Notes (Signed)
Numbers and EKG look good, no changes    Zandra Abts MD

## 2017-04-14 DIAGNOSIS — R7989 Other specified abnormal findings of blood chemistry: Secondary | ICD-10-CM | POA: Diagnosis not present

## 2017-04-14 DIAGNOSIS — Z79899 Other long term (current) drug therapy: Secondary | ICD-10-CM | POA: Diagnosis not present

## 2017-04-14 DIAGNOSIS — Z885 Allergy status to narcotic agent status: Secondary | ICD-10-CM | POA: Diagnosis not present

## 2017-04-14 DIAGNOSIS — E875 Hyperkalemia: Secondary | ICD-10-CM | POA: Diagnosis not present

## 2017-04-14 DIAGNOSIS — R1011 Right upper quadrant pain: Secondary | ICD-10-CM | POA: Diagnosis not present

## 2017-04-14 DIAGNOSIS — I509 Heart failure, unspecified: Secondary | ICD-10-CM | POA: Diagnosis not present

## 2017-04-14 DIAGNOSIS — Z7982 Long term (current) use of aspirin: Secondary | ICD-10-CM | POA: Diagnosis not present

## 2017-04-14 DIAGNOSIS — Z87442 Personal history of urinary calculi: Secondary | ICD-10-CM | POA: Diagnosis not present

## 2017-04-14 DIAGNOSIS — Z7902 Long term (current) use of antithrombotics/antiplatelets: Secondary | ICD-10-CM | POA: Diagnosis not present

## 2017-04-14 DIAGNOSIS — D469 Myelodysplastic syndrome, unspecified: Secondary | ICD-10-CM | POA: Diagnosis not present

## 2017-04-14 DIAGNOSIS — Z87891 Personal history of nicotine dependence: Secondary | ICD-10-CM | POA: Diagnosis not present

## 2017-04-21 DIAGNOSIS — Z299 Encounter for prophylactic measures, unspecified: Secondary | ICD-10-CM | POA: Diagnosis not present

## 2017-04-21 DIAGNOSIS — E785 Hyperlipidemia, unspecified: Secondary | ICD-10-CM | POA: Diagnosis not present

## 2017-04-21 DIAGNOSIS — E1122 Type 2 diabetes mellitus with diabetic chronic kidney disease: Secondary | ICD-10-CM | POA: Diagnosis not present

## 2017-04-21 DIAGNOSIS — Z6831 Body mass index (BMI) 31.0-31.9, adult: Secondary | ICD-10-CM | POA: Diagnosis not present

## 2017-04-21 DIAGNOSIS — M1710 Unilateral primary osteoarthritis, unspecified knee: Secondary | ICD-10-CM | POA: Diagnosis not present

## 2017-04-21 DIAGNOSIS — E7800 Pure hypercholesterolemia, unspecified: Secondary | ICD-10-CM | POA: Diagnosis not present

## 2017-04-21 DIAGNOSIS — D471 Chronic myeloproliferative disease: Secondary | ICD-10-CM | POA: Diagnosis not present

## 2017-04-21 DIAGNOSIS — I2510 Atherosclerotic heart disease of native coronary artery without angina pectoris: Secondary | ICD-10-CM | POA: Diagnosis not present

## 2017-04-21 DIAGNOSIS — N183 Chronic kidney disease, stage 3 (moderate): Secondary | ICD-10-CM | POA: Diagnosis not present

## 2017-04-21 DIAGNOSIS — I10 Essential (primary) hypertension: Secondary | ICD-10-CM | POA: Diagnosis not present

## 2017-04-28 DIAGNOSIS — D469 Myelodysplastic syndrome, unspecified: Secondary | ICD-10-CM | POA: Diagnosis not present

## 2017-04-28 DIAGNOSIS — N183 Chronic kidney disease, stage 3 (moderate): Secondary | ICD-10-CM | POA: Diagnosis not present

## 2017-05-05 DIAGNOSIS — N183 Chronic kidney disease, stage 3 (moderate): Secondary | ICD-10-CM | POA: Diagnosis not present

## 2017-05-05 DIAGNOSIS — Z7982 Long term (current) use of aspirin: Secondary | ICD-10-CM | POA: Diagnosis not present

## 2017-05-05 DIAGNOSIS — R8290 Unspecified abnormal findings in urine: Secondary | ICD-10-CM | POA: Diagnosis not present

## 2017-05-05 DIAGNOSIS — Z885 Allergy status to narcotic agent status: Secondary | ICD-10-CM | POA: Diagnosis not present

## 2017-05-05 DIAGNOSIS — I130 Hypertensive heart and chronic kidney disease with heart failure and stage 1 through stage 4 chronic kidney disease, or unspecified chronic kidney disease: Secondary | ICD-10-CM | POA: Diagnosis not present

## 2017-05-05 DIAGNOSIS — D469 Myelodysplastic syndrome, unspecified: Secondary | ICD-10-CM | POA: Diagnosis not present

## 2017-05-05 DIAGNOSIS — Z87891 Personal history of nicotine dependence: Secondary | ICD-10-CM | POA: Diagnosis not present

## 2017-05-05 DIAGNOSIS — I509 Heart failure, unspecified: Secondary | ICD-10-CM | POA: Diagnosis not present

## 2017-05-05 DIAGNOSIS — Z79899 Other long term (current) drug therapy: Secondary | ICD-10-CM | POA: Diagnosis not present

## 2017-05-05 DIAGNOSIS — R7989 Other specified abnormal findings of blood chemistry: Secondary | ICD-10-CM | POA: Diagnosis not present

## 2017-05-05 DIAGNOSIS — N179 Acute kidney failure, unspecified: Secondary | ICD-10-CM | POA: Diagnosis not present

## 2017-05-05 DIAGNOSIS — K219 Gastro-esophageal reflux disease without esophagitis: Secondary | ICD-10-CM | POA: Diagnosis not present

## 2017-05-05 DIAGNOSIS — Z955 Presence of coronary angioplasty implant and graft: Secondary | ICD-10-CM | POA: Diagnosis not present

## 2017-05-05 DIAGNOSIS — Z87442 Personal history of urinary calculi: Secondary | ICD-10-CM | POA: Diagnosis not present

## 2017-05-12 DIAGNOSIS — D473 Essential (hemorrhagic) thrombocythemia: Secondary | ICD-10-CM | POA: Diagnosis not present

## 2017-05-12 DIAGNOSIS — R7989 Other specified abnormal findings of blood chemistry: Secondary | ICD-10-CM | POA: Diagnosis not present

## 2017-05-15 DIAGNOSIS — F17210 Nicotine dependence, cigarettes, uncomplicated: Secondary | ICD-10-CM | POA: Diagnosis not present

## 2017-05-15 DIAGNOSIS — E1165 Type 2 diabetes mellitus with hyperglycemia: Secondary | ICD-10-CM | POA: Diagnosis not present

## 2017-05-15 DIAGNOSIS — E1122 Type 2 diabetes mellitus with diabetic chronic kidney disease: Secondary | ICD-10-CM | POA: Diagnosis not present

## 2017-05-15 DIAGNOSIS — N183 Chronic kidney disease, stage 3 (moderate): Secondary | ICD-10-CM | POA: Diagnosis not present

## 2017-05-15 DIAGNOSIS — Z299 Encounter for prophylactic measures, unspecified: Secondary | ICD-10-CM | POA: Diagnosis not present

## 2017-05-15 DIAGNOSIS — Z6831 Body mass index (BMI) 31.0-31.9, adult: Secondary | ICD-10-CM | POA: Diagnosis not present

## 2017-05-26 DIAGNOSIS — R001 Bradycardia, unspecified: Secondary | ICD-10-CM | POA: Diagnosis not present

## 2017-05-26 DIAGNOSIS — D72829 Elevated white blood cell count, unspecified: Secondary | ICD-10-CM | POA: Diagnosis not present

## 2017-05-26 DIAGNOSIS — Z87891 Personal history of nicotine dependence: Secondary | ICD-10-CM | POA: Diagnosis not present

## 2017-05-26 DIAGNOSIS — Z79899 Other long term (current) drug therapy: Secondary | ICD-10-CM | POA: Diagnosis not present

## 2017-05-26 DIAGNOSIS — Z7902 Long term (current) use of antithrombotics/antiplatelets: Secondary | ICD-10-CM | POA: Diagnosis not present

## 2017-05-26 DIAGNOSIS — N179 Acute kidney failure, unspecified: Secondary | ICD-10-CM | POA: Diagnosis not present

## 2017-05-26 DIAGNOSIS — E1122 Type 2 diabetes mellitus with diabetic chronic kidney disease: Secondary | ICD-10-CM | POA: Diagnosis not present

## 2017-05-26 DIAGNOSIS — Z7982 Long term (current) use of aspirin: Secondary | ICD-10-CM | POA: Diagnosis not present

## 2017-05-26 DIAGNOSIS — D469 Myelodysplastic syndrome, unspecified: Secondary | ICD-10-CM | POA: Diagnosis not present

## 2017-05-26 DIAGNOSIS — I130 Hypertensive heart and chronic kidney disease with heart failure and stage 1 through stage 4 chronic kidney disease, or unspecified chronic kidney disease: Secondary | ICD-10-CM | POA: Diagnosis not present

## 2017-05-26 DIAGNOSIS — Z955 Presence of coronary angioplasty implant and graft: Secondary | ICD-10-CM | POA: Diagnosis not present

## 2017-05-26 DIAGNOSIS — Z885 Allergy status to narcotic agent status: Secondary | ICD-10-CM | POA: Diagnosis not present

## 2017-05-26 DIAGNOSIS — N189 Chronic kidney disease, unspecified: Secondary | ICD-10-CM | POA: Diagnosis not present

## 2017-05-26 DIAGNOSIS — D473 Essential (hemorrhagic) thrombocythemia: Secondary | ICD-10-CM | POA: Diagnosis not present

## 2017-05-26 DIAGNOSIS — I509 Heart failure, unspecified: Secondary | ICD-10-CM | POA: Diagnosis not present

## 2017-05-26 DIAGNOSIS — Z87442 Personal history of urinary calculi: Secondary | ICD-10-CM | POA: Diagnosis not present

## 2017-05-26 DIAGNOSIS — M5136 Other intervertebral disc degeneration, lumbar region: Secondary | ICD-10-CM | POA: Diagnosis not present

## 2017-05-26 DIAGNOSIS — R1011 Right upper quadrant pain: Secondary | ICD-10-CM | POA: Diagnosis not present

## 2017-06-15 DIAGNOSIS — M1710 Unilateral primary osteoarthritis, unspecified knee: Secondary | ICD-10-CM | POA: Diagnosis not present

## 2017-06-15 DIAGNOSIS — Z6830 Body mass index (BMI) 30.0-30.9, adult: Secondary | ICD-10-CM | POA: Diagnosis not present

## 2017-06-15 DIAGNOSIS — Z299 Encounter for prophylactic measures, unspecified: Secondary | ICD-10-CM | POA: Diagnosis not present

## 2017-06-15 DIAGNOSIS — I10 Essential (primary) hypertension: Secondary | ICD-10-CM | POA: Diagnosis not present

## 2017-06-15 DIAGNOSIS — D471 Chronic myeloproliferative disease: Secondary | ICD-10-CM | POA: Diagnosis not present

## 2017-06-15 DIAGNOSIS — E7800 Pure hypercholesterolemia, unspecified: Secondary | ICD-10-CM | POA: Diagnosis not present

## 2017-06-16 DIAGNOSIS — D469 Myelodysplastic syndrome, unspecified: Secondary | ICD-10-CM | POA: Diagnosis not present

## 2017-06-16 DIAGNOSIS — N179 Acute kidney failure, unspecified: Secondary | ICD-10-CM | POA: Diagnosis not present

## 2017-06-16 DIAGNOSIS — I499 Cardiac arrhythmia, unspecified: Secondary | ICD-10-CM | POA: Diagnosis not present

## 2017-06-16 DIAGNOSIS — R1011 Right upper quadrant pain: Secondary | ICD-10-CM | POA: Diagnosis not present

## 2017-06-16 DIAGNOSIS — R109 Unspecified abdominal pain: Secondary | ICD-10-CM | POA: Diagnosis not present

## 2017-06-16 DIAGNOSIS — Z87442 Personal history of urinary calculi: Secondary | ICD-10-CM | POA: Diagnosis not present

## 2017-07-10 DIAGNOSIS — D469 Myelodysplastic syndrome, unspecified: Secondary | ICD-10-CM | POA: Diagnosis not present

## 2017-07-10 DIAGNOSIS — F419 Anxiety disorder, unspecified: Secondary | ICD-10-CM | POA: Diagnosis not present

## 2017-07-10 DIAGNOSIS — F39 Unspecified mood [affective] disorder: Secondary | ICD-10-CM | POA: Diagnosis not present

## 2017-07-23 DIAGNOSIS — Z6830 Body mass index (BMI) 30.0-30.9, adult: Secondary | ICD-10-CM | POA: Diagnosis not present

## 2017-07-23 DIAGNOSIS — Z299 Encounter for prophylactic measures, unspecified: Secondary | ICD-10-CM | POA: Diagnosis not present

## 2017-07-23 DIAGNOSIS — E1122 Type 2 diabetes mellitus with diabetic chronic kidney disease: Secondary | ICD-10-CM | POA: Diagnosis not present

## 2017-07-23 DIAGNOSIS — F17210 Nicotine dependence, cigarettes, uncomplicated: Secondary | ICD-10-CM | POA: Diagnosis not present

## 2017-07-23 DIAGNOSIS — N183 Chronic kidney disease, stage 3 (moderate): Secondary | ICD-10-CM | POA: Diagnosis not present

## 2017-07-23 DIAGNOSIS — I10 Essential (primary) hypertension: Secondary | ICD-10-CM | POA: Diagnosis not present

## 2017-08-04 DIAGNOSIS — R634 Abnormal weight loss: Secondary | ICD-10-CM | POA: Diagnosis not present

## 2017-08-04 DIAGNOSIS — D473 Essential (hemorrhagic) thrombocythemia: Secondary | ICD-10-CM | POA: Diagnosis not present

## 2017-08-04 DIAGNOSIS — R638 Other symptoms and signs concerning food and fluid intake: Secondary | ICD-10-CM | POA: Diagnosis not present

## 2017-08-04 DIAGNOSIS — D469 Myelodysplastic syndrome, unspecified: Secondary | ICD-10-CM | POA: Diagnosis not present

## 2017-08-04 DIAGNOSIS — R5383 Other fatigue: Secondary | ICD-10-CM | POA: Diagnosis not present

## 2017-08-04 DIAGNOSIS — D72829 Elevated white blood cell count, unspecified: Secondary | ICD-10-CM | POA: Diagnosis not present

## 2017-08-04 DIAGNOSIS — R109 Unspecified abdominal pain: Secondary | ICD-10-CM | POA: Diagnosis not present

## 2017-08-08 ENCOUNTER — Emergency Department (HOSPITAL_COMMUNITY): Payer: Medicare Other

## 2017-08-08 ENCOUNTER — Encounter (HOSPITAL_COMMUNITY): Payer: Self-pay | Admitting: Cardiology

## 2017-08-08 ENCOUNTER — Emergency Department (HOSPITAL_COMMUNITY)
Admission: EM | Admit: 2017-08-08 | Discharge: 2017-08-08 | Disposition: A | Discharge status: Home / Self Care | Payer: Medicare Other | Attending: Emergency Medicine | Admitting: Emergency Medicine

## 2017-08-08 DIAGNOSIS — K449 Diaphragmatic hernia without obstruction or gangrene: Secondary | ICD-10-CM | POA: Diagnosis not present

## 2017-08-08 DIAGNOSIS — Z87891 Personal history of nicotine dependence: Secondary | ICD-10-CM | POA: Insufficient documentation

## 2017-08-08 DIAGNOSIS — I509 Heart failure, unspecified: Secondary | ICD-10-CM | POA: Insufficient documentation

## 2017-08-08 DIAGNOSIS — I130 Hypertensive heart and chronic kidney disease with heart failure and stage 1 through stage 4 chronic kidney disease, or unspecified chronic kidney disease: Secondary | ICD-10-CM | POA: Insufficient documentation

## 2017-08-08 DIAGNOSIS — R1011 Right upper quadrant pain: Secondary | ICD-10-CM | POA: Diagnosis not present

## 2017-08-08 DIAGNOSIS — N183 Chronic kidney disease, stage 3 (moderate): Secondary | ICD-10-CM | POA: Diagnosis not present

## 2017-08-08 DIAGNOSIS — I2510 Atherosclerotic heart disease of native coronary artery without angina pectoris: Secondary | ICD-10-CM | POA: Diagnosis not present

## 2017-08-08 DIAGNOSIS — R109 Unspecified abdominal pain: Secondary | ICD-10-CM

## 2017-08-08 DIAGNOSIS — I159 Secondary hypertension, unspecified: Secondary | ICD-10-CM | POA: Diagnosis not present

## 2017-08-08 DIAGNOSIS — E119 Type 2 diabetes mellitus without complications: Secondary | ICD-10-CM | POA: Diagnosis not present

## 2017-08-08 DIAGNOSIS — R079 Chest pain, unspecified: Secondary | ICD-10-CM | POA: Diagnosis not present

## 2017-08-08 DIAGNOSIS — R1013 Epigastric pain: Secondary | ICD-10-CM | POA: Diagnosis not present

## 2017-08-08 LAB — COMPREHENSIVE METABOLIC PANEL
ALT: 34 U/L (ref 17–63)
ANION GAP: 10 (ref 5–15)
AST: 35 U/L (ref 15–41)
Albumin: 4.2 g/dL (ref 3.5–5.0)
Alkaline Phosphatase: 64 U/L (ref 38–126)
BUN: 22 mg/dL — ABNORMAL HIGH (ref 6–20)
CHLORIDE: 106 mmol/L (ref 101–111)
CO2: 23 mmol/L (ref 22–32)
Calcium: 9.2 mg/dL (ref 8.9–10.3)
Creatinine, Ser: 1.86 mg/dL — ABNORMAL HIGH (ref 0.61–1.24)
GFR calc Af Amer: 40 mL/min — ABNORMAL LOW (ref >60)
GFR, EST NON AFRICAN AMERICAN: 35 mL/min — AB (ref >60)
Glucose, Bld: 121 mg/dL — ABNORMAL HIGH (ref 65–99)
POTASSIUM: 4.8 mmol/L (ref 3.5–5.1)
Sodium: 139 mmol/L (ref 135–145)
Total Bilirubin: 0.7 mg/dL (ref 0.3–1.2)
Total Protein: 6.8 g/dL (ref 6.5–8.1)

## 2017-08-08 LAB — TROPONIN I: Troponin I: <0.03 ng/mL (ref <0.03)

## 2017-08-08 LAB — LIPASE, BLOOD: LIPASE: 37 U/L (ref 11–51)

## 2017-08-08 LAB — BRAIN NATRIURETIC PEPTIDE: B Natriuretic Peptide: 111 pg/mL — ABNORMAL HIGH (ref 0.0–100.0)

## 2017-08-08 MED ORDER — OXYCODONE-ACETAMINOPHEN 5-325 MG PO TABS: 1.0000 | ORAL_TABLET | Freq: Three times a day (TID) | ORAL | 0 refills | Status: DC | PRN

## 2017-08-08 MED ORDER — LISINOPRIL 5 MG PO TABS
5.0000 mg | ORAL_TABLET | Freq: Once | ORAL | Status: AC
  Administered 2017-08-08: 5 mg via ORAL
  Filled 2017-08-08: qty 1

## 2017-08-08 MED ORDER — SODIUM CHLORIDE 0.9 % IV BOLUS (SEPSIS)
500.0000 mL | Freq: Once | INTRAVENOUS | Status: AC
  Administered 2017-08-08: 500 mL via INTRAVENOUS

## 2017-08-08 MED ORDER — GI COCKTAIL ~~LOC~~
30.0000 mL | Freq: Once | ORAL | Status: AC
  Administered 2017-08-08: 30 mL via ORAL
  Filled 2017-08-08: qty 30

## 2017-08-08 MED ORDER — IOPAMIDOL (ISOVUE-300) INJECTION 61%
75.0000 mL | Freq: Once | INTRAVENOUS | Status: AC | PRN
  Administered 2017-08-08: 75 mL via INTRAVENOUS

## 2017-08-08 MED ORDER — FENTANYL CITRATE (PF) 100 MCG/2ML IJ SOLN
50.0000 ug | Freq: Once | INTRAMUSCULAR | Status: AC
Start: 2017-08-08 — End: 2017-08-08
  Administered 2017-08-08: 50 ug via INTRAVENOUS
  Filled 2017-08-08: qty 2

## 2017-08-08 MED ORDER — NITROGLYCERIN 0.4 MG SL SUBL: 0.4000 mg | SUBLINGUAL_TABLET | SUBLINGUAL | Status: DC | PRN

## 2017-08-08 MED ORDER — LISINOPRIL 10 MG PO TABS
10.0000 mg | ORAL_TABLET | Freq: Every day | ORAL | 0 refills | Status: DC
Start: 2017-08-08 — End: 2017-09-17

## 2017-08-08 MED ORDER — HYDROMORPHONE HCL 1 MG/ML IJ SOLN
1.0000 mg | Freq: Once | INTRAMUSCULAR | Status: AC
  Administered 2017-08-08: 1 mg via INTRAVENOUS
  Filled 2017-08-08: qty 1

## 2017-08-08 MED ORDER — PANTOPRAZOLE SODIUM 20 MG PO TBEC: 20.0000 mg | DELAYED_RELEASE_TABLET | Freq: Every day | ORAL | 0 refills | Status: DC

## 2017-08-08 NOTE — ED Triage Notes (Signed)
Blood pressure today 227/95.  Pt c/o weakness and some upper abdominal pain.

## 2017-08-08 NOTE — ED Provider Notes (Addendum)
Emergency Department Provider Note   I have reviewed the triage vital signs and the nursing notes.   HISTORY  Chief Complaint Hypertension   HPI [name] is a 72 y.o. male with a history of coronary artery disease, hypertension, leukemia, myelodysplastic syndrome and renal insufficiency with diabetes a presents to the emergency department today secondary to worsening epigastric pain.  Patient feels like a pressure there.  This is been progressively worsening the last couple weeks and then today noticed he also had elevated pressures.  Not really any shortness of breath but just this pressure.  Does feel like he has some abdominal distention as well which is new.  Of note patient states that his nephrologist told him to stop taking his Plavix and all of his blood pressure medications.  I cannot verify this through the records but I do see where they told him to stop taking hydralazine and Norvasc but it appears he is supposed to continue his metoprolol which she has not done nor does see them tell him to stop the Plavix. No other associated or modifying symptoms.    Past Medical History:  Diagnosis Date  . Anxiety   . Arthritis   . BPH (benign prostatic hyperplasia)   . CAD (coronary artery disease)    DES to circumflex 07/2016, moderate residual LAD and RCA, small 90% OM3 - managed medically  . Chronic lower back pain   . Depression   . Essential hypertension   . Gastritis   . GERD (gastroesophageal reflux disease)   . History of kidney stones   . Leukemia ([place])   . 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[place])    Likely MDS/MPN, unclassifiable - folowed at [facility]  . Pneumonia X 1  . Renal insufficiency   . Spleen enlarged   . Type II diabetes mellitus [facility])     Patient Active Problem List   Diagnosis Date Noted  . Acute CHF (congestive heart failure) ([place]) 09/15/2016  . Essential hypertension 08/14/2016  . Headache 08/14/2016  . Hyperlipidemia LDL goal <70 08/14/2016  .  Abnormal nuclear stress test   . 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 Acute respiratory failure with hypoxia ([place]) 04/27/2016  . Hypoxia   . Hematoma 04/25/2016  . Mass of chest wall, left   . Abnormal partial thromboplastin time (PTT)   . 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[place])   . Coronary artery disease due to lipid rich plaque 04/15/2016  . Leukocytosis 04/15/2016  . Diabetes mellitus ([place]) 04/15/2016  . Chest pain 04/15/2016  . CKD (chronic kidney disease) stage 3, GFR 30-59 ml/min ([facility]) 04/15/2016  . Left shoulder pain 04/15/2016  . Encounter for screening colonoscopy 01/21/2013  . GERD (gastroesophageal reflux disease) 01/21/2013    Past Surgical History:  Procedure Laterality Date  . BACK SURGERY    . BONE MARROW BIOPSY     x 3 times  . CARDIAC CATHETERIZATION N/A 08/12/2016   Procedure: Left Heart Cath and Coronary Angiography;  Surgeon: Jettie Booze, MD;  Location: [facility];  Service: Cardiovascular;  Laterality: N/A;  . CARDIAC CATHETERIZATION N/A 08/12/2016   Procedure: Coronary Stent Intervention;  Surgeon: Jettie Booze, MD;  Location: [facility];  Service: Cardiovascular;  Laterality: N/A;  . CARDIAC CATHETERIZATION  2000s X 1  . COLONOSCOPY WITH PROPOFOL N/A 02/01/2013   SLF: 1. 23 colon polyps removed. 2 retrieved. 2. Mild diverticulosis in teh sigmoid colon 3. Small internal hemorrhoids 4. The colon is redundant   .  CYSTOSCOPY W/ STONE MANIPULATION    . ESOPHAGOGASTRODUODENOSCOPY (EGD) WITH PROPOFOL N/A 02/01/2013   SLF: 1. Moderate erosive gastritis  . [place]  . POLYPECTOMY N/A 02/01/2013   Procedure: POLYPECTOMY;  Surgeon: Danie Binder, MD;  Location: [facility];  Service: Endoscopy;  Laterality: N/A;    Current Outpatient Rx  . Order #: [id number] Class: Historical Med  . Order #: [id number] Class: Historical Med  . Order #: [id number] Class: Historical Med  . Order #: [id number] Class: Historical Med  . Order #:  [id number] Class: Historical Med  . Order #: [id number] Class: Historical Med  . Order #: [id number] Class: Historical Med  . Order #: [id number] Class: Historical Med  . Order #: [id number] Class: Normal  . Order #: [id number] Class: Historical Med  . Order #: [id number] Class: Historical Med  . Order #: [id number] Class: Historical Med  . Order #: [id number] Class: Historical Med  . Order #: [id number] Class: Historical Med  . Order #: [id number] Class: Print  . Order #: [id number] Class: Print  . Order #: [id number] Class: Print    Allergies Hydrocodone  Family History  Problem Relation Age of Onset  . Heart attack Mother   . CVA Mother   . Heart attack Father   . Colon cancer Neg Hx     Social History Social History   Tobacco Use  . Smoking status: Former Smoker    Packs/day: 1.00    Years: 20.00    Pack years: 20.00    Types: Cigars    Start date: 06/25/1975    Last attempt to quit: 08/04/2013    Years since quitting: 4.0  . Smokeless tobacco: Never Used  Substance Use Topics  . 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 Drug use: No    Review of Systems  All other systems negative except as documented in the HPI. All pertinent positives and negatives as reviewed in the HPI. ____________________________________________   PHYSICAL EXAM:  VITAL SIGNS: ED Triage Vitals  Enc Vitals Group     BP 08/08/17 1300 (!) 219/95     Pulse Rate 08/08/17 1300 60     Resp 08/08/17 1300 16     Temp --      Temp src --      SpO2 08/08/17 1300 99 %     Weight 08/08/17 1258 212 lb (96.2 kg)     Height 08/08/17 1258 6' (1.829 m)    Constitutional: Alert and oriented. Well appearing and in no acute distress. Eyes: Conjunctivae are normal. PERRL. EOMI. Head: Atraumatic. Nose: No congestion/rhinnorhea. Mouth/Throat: Mucous membranes are moist.  Oropharynx non-erythematous. Neck: No stridor.  No meningeal signs.   Cardiovascular: Normal rate,  regular rhythm. Good peripheral circulation. Grossly normal heart sounds.   Respiratory: Normal respiratory effort.  No retractions. Lungs CTAB. Gastrointestinal: Soft and epigastric tenderness.  Mild distention.  Musculoskeletal: No lower extremity tenderness nor edema. No gross deformities of extremities. Neurologic:  Normal speech and language. No gross focal neurologic deficits are appreciated.  Skin:  Skin is warm, dry and intact. No rash noted.   ____________________________________________   LABS (all labs ordered are listed, but only abnormal results are displayed)  Labs Reviewed  CBC WITH DIFFERENTIAL/PLATELET - Abnormal; Notable for the following components:      Result Value   WBC 12.5 (*)    RDW 20.1 (*)    Platelets 117 (*)    nRBC 2 (*)    Neutro Abs 8.1 (*)  All other components within normal limits  COMPREHENSIVE METABOLIC PANEL - Abnormal; Notable for the following components:   Glucose, Bld 121 (*)    BUN 22 (*)    Creatinine, Ser 1.86 (*)    GFR calc non Af Amer 35 (*)    GFR calc Af Amer 40 (*)    All other components within normal limits  BRAIN NATRIURETIC PEPTIDE - Abnormal; Notable for the following components:   B Natriuretic Peptide 111.0 (*)    All other components within normal limits  LIPASE, BLOOD  TROPONIN I  PATHOLOGIST SMEAR REVIEW   ____________________________________________  EKG   EKG Interpretation  Date/Time:  Saturday August 08 2017 13:05:59 EST Ventricular Rate:  58 PR Interval:    QRS Duration: 89 QT Interval:  368 QTC Calculation: 362 R Axis:   5 Text Interpretation:  Sinus rhythm Abnormal R-wave progression, early transition Nonspecific T abnormalities, lateral leads No significant change since last tracing Confirmed by Merrily Pew [phone]) on 08/08/2017 1:26:22 PM      ____________________________________________  RADIOLOGY  Dg Chest 2 View  Result Date: 08/08/2017 CLINICAL DATA:  Chest and epigastric pain.   Hypertension. EXAM: CHEST  2 VIEW COMPARISON:  September 15, 2016 FINDINGS: There is no edema or consolidation. The heart size and pulmonary vascularity are normal. No adenopathy. There is aortic atherosclerosis. There is anterior wedging of a midthoracic vertebral body. IMPRESSION: No edema or consolidation.  There is aortic atherosclerosis. Aortic Atherosclerosis (ICD10-I70.0). Electronically Signed   By: Lowella Grip III M.D.   On: 08/08/2017 14:38   Ct Abdomen Pelvis W Contrast  Result Date: 08/08/2017 CLINICAL DATA:  Right abdominal pain, history of leukemia, diabetes EXAM: CT ABDOMEN AND PELVIS WITH CONTRAST TECHNIQUE: Multidetector CT imaging of the abdomen and pelvis was performed using the standard protocol following bolus administration of intravenous contrast. CONTRAST:  6m ISOVUE-300 IOPAMIDOL (ISOVUE-300) INJECTION 61% COMPARISON:  04/23/2016 FINDINGS: Lower chest: Bibasilar atelectasis. Atherosclerosis of the coronary vasculature and descending thoracic aorta. No pericardial or pleural effusion. Small hiatal hernia. Hepatobiliary: No focal liver abnormality is seen. No gallstones, gallbladder wall thickening, or biliary dilatation. Pancreas: Unremarkable. No pancreatic ductal dilatation or surrounding inflammatory changes. Spleen: Splenomegaly again noted measuring 18 cm in length, previously 21 cm no focal abnormality. Adrenals/Urinary Tract: Normal adrenal glands. Kidneys demonstrate central sinus fatty proliferation bilaterally. Chronic perinephric strandy edema. No renal obstruction, hydronephrosis, hydroureter, or obstructing ureteral calculus. Bladder unremarkable. Stomach/Bowel: Stomach is within normal limits. Appendix appears normal. No evidence of bowel wall thickening, distention, or inflammatory changes. Vascular/Lymphatic: Aortic atherosclerosis noted. Negative for aneurysm. No retroperitoneal abnormality. No adenopathy. Reproductive: Symmetric seminal vesicles. Prostate normal  in size. No acute or significant finding. Other: Intact abdominal wall. No ascites. Fat containing umbilical hernias bilaterally. Musculoskeletal: Stable mixed lucent and sclerotic pattern of the spine as before. No acute compression fracture. Lower lumbar facet arthropathy. Chronic postop changes at L4-5. IMPRESSION: No acute intra-abdominal or pelvic finding by CT. Chronic splenomegaly Abdominal atherosclerosis without aneurysm Chronic fat containing inguinal hernias bilaterally Electronically Signed   By: MJerilynn Mages  Shick M.D.   On: 08/08/2017 15:22   ____________________________________________   PROCEDURES  Procedure(s) performed:   Procedures   ____________________________________________   INITIAL IMPRESSION / ASSESSMENT AND PLAN / ED COURSE  72year old male here with some epigastric pain associated with hypertension.  Troponin negative EKG without ischemic changes and if he had ongoing ischemia I feel like these would show that. .  Rest of workup unremarkable and his CT scan  normal.   Suspect this could be related just to his hypertension as there is no acute findings on any of his workup here.  He is overall comfortable does have some mild distention but no evidence of ascites.  Does not have a surgical abdomen.    Pain improved here.  Will restart antihypertensive medications and with his history of kidney disease will use lisinopril at this time.  Will need follow-up for continuation of this.   I will also try reflux medications to see if this helps with his symptoms.   After discussion with the patient and sonographer will get an ultrasound that we can do today.  We will give him a short course of pain medication, start lisinopril and start reflux medications if his ultrasound is negative.   Obviously if he has acute cholecystitis similar gallbladder or hepatic pathology that would need to be addressed.  All questions answered prior to my departure and he will continue follow with his  nephrologist for blood pressure control, GI for the stomach pain starting with his primary doctor.  Dr. Sabra Heck will provide the results of his ultrasound to him.  Pertinent labs & imaging results that were available during my care of the patient were reviewed by me and considered in my medical decision making (see chart for details).  ____________________________________________  FINAL CLINICAL IMPRESSION(S) / ED DIAGNOSES  Final diagnoses:  Abdominal pain, unspecified abdominal location  Secondary hypertension     MEDICATIONS GIVEN DURING THIS VISIT:  Medications  nitroGLYCERIN (NITROSTAT) SL tablet 0.4 mg (not administered)  fentaNYL (SUBLIMAZE) injection 50 mcg (50 mcg Intravenous Given 08/08/17 1351)  sodium chloride 0.9 % bolus 500 mL (0 mLs Intravenous Stopped 08/08/17 1427)  iopamidol (ISOVUE-300) 61 % injection 75 mL (75 mLs Intravenous Contrast Given 08/08/17 1447)  HYDROmorphone (DILAUDID) injection 1 mg (1 mg Intravenous Given 08/08/17 1507)  lisinopril (PRINIVIL,ZESTRIL) tablet 5 mg (5 mg Oral Given 08/08/17 1621)  gi cocktail (Maalox,Lidocaine,Donnatal) (30 mLs Oral Given 08/08/17 1626)     NEW OUTPATIENT MEDICATIONS STARTED DURING THIS VISIT:  New Prescriptions   LISINOPRIL (PRINIVIL,ZESTRIL) 10 MG TABLET    Take 1 tablet (10 mg total) by mouth daily.   OXYCODONE-ACETAMINOPHEN (PERCOCET) 5-325 MG TABLET    Take 1-2 tablets by mouth every 8 (eight) hours as needed for severe pain.   PANTOPRAZOLE (PROTONIX) 20 MG TABLET    Take 1 tablet (20 mg total) by mouth daily.    Note:  This note was prepared with assistance of Dragon voice recognition software. Occasional wrong-word or sound-a-like substitutions may have occurred due to the inherent limitations of voice recognition software.   Merrily Pew, MD 08/08/17 1641    Merrily Pew, MD 08/08/17 [phone]

## 2017-08-08 NOTE — ED Provider Notes (Signed)
The patient has a negative ultrasound, negative CT scan no acute findings to explain the patient's symptoms.  Prescriptions have been written by Dr. Dayna Barker for the patient to be treated as an outpatient and to follow-up closely.  Patient expressed his understanding.  He has had persistent hypertension, he has been taken off all of his home medications and will need to be restarted on some.  I have encouraged him to have his kidney function rechecked.  Patient expresses understanding.   Noemi Chapel, MD 08/08/17 1745

## 2017-08-10 LAB — CBC WITH DIFFERENTIAL/PLATELET
BASOS ABS: 0.1 10*3/uL (ref 0.0–0.1)
Band Neutrophils: 10 %
Basophils Relative: 1 %
Blasts: 0 %
EOS PCT: 2 %
Eosinophils Absolute: 0.3 10*3/uL (ref 0.0–0.7)
HCT: 41 % (ref 39.0–52.0)
Hemoglobin: 13.7 g/dL (ref 13.0–17.0)
LYMPHS ABS: 3.1 10*3/uL (ref 0.7–4.0)
Lymphocytes Relative: 25 %
MCH: 29.3 pg (ref 26.0–34.0)
MCHC: 33.4 g/dL (ref 30.0–36.0)
MCV: 87.8 fL (ref 78.0–100.0)
METAMYELOCYTES PCT: 4 %
MONOS PCT: 7 %
Monocytes Absolute: 0.9 10*3/uL (ref 0.1–1.0)
Myelocytes: 9 %
NEUTROS ABS: 8.1 10*3/uL — AB (ref 1.7–7.7)
Neutrophils Relative %: 40 %
Other: 0 %
PLATELETS: 117 10*3/uL — AB (ref 150–400)
Promyelocytes Absolute: 2 %
RBC: 4.67 MIL/uL (ref 4.22–5.81)
RDW: 20.1 % — AB (ref 11.5–15.5)
WBC: 12.5 10*3/uL — AB (ref 4.0–10.5)
nRBC: 2 /100 WBC — ABNORMAL HIGH

## 2017-08-10 LAB — PATHOLOGIST SMEAR REVIEW

## 2017-08-24 DIAGNOSIS — Z299 Encounter for prophylactic measures, unspecified: Secondary | ICD-10-CM | POA: Diagnosis not present

## 2017-08-24 DIAGNOSIS — I10 Essential (primary) hypertension: Secondary | ICD-10-CM | POA: Diagnosis not present

## 2017-08-24 DIAGNOSIS — E1165 Type 2 diabetes mellitus with hyperglycemia: Secondary | ICD-10-CM | POA: Diagnosis not present

## 2017-08-24 DIAGNOSIS — Z79899 Other long term (current) drug therapy: Secondary | ICD-10-CM | POA: Diagnosis not present

## 2017-08-24 DIAGNOSIS — M545 Low back pain: Secondary | ICD-10-CM | POA: Diagnosis not present

## 2017-08-24 DIAGNOSIS — G4700 Insomnia, unspecified: Secondary | ICD-10-CM | POA: Diagnosis not present

## 2017-08-24 DIAGNOSIS — Z6831 Body mass index (BMI) 31.0-31.9, adult: Secondary | ICD-10-CM | POA: Diagnosis not present

## 2017-08-24 DIAGNOSIS — E7800 Pure hypercholesterolemia, unspecified: Secondary | ICD-10-CM | POA: Diagnosis not present

## 2017-08-24 DIAGNOSIS — N183 Chronic kidney disease, stage 3 (moderate): Secondary | ICD-10-CM | POA: Diagnosis not present

## 2017-08-24 DIAGNOSIS — D471 Chronic myeloproliferative disease: Secondary | ICD-10-CM | POA: Diagnosis not present

## 2017-08-24 DIAGNOSIS — E1122 Type 2 diabetes mellitus with diabetic chronic kidney disease: Secondary | ICD-10-CM | POA: Diagnosis not present

## 2017-08-24 DIAGNOSIS — I2510 Atherosclerotic heart disease of native coronary artery without angina pectoris: Secondary | ICD-10-CM | POA: Diagnosis not present

## 2017-08-31 ENCOUNTER — Other Ambulatory Visit: Payer: Self-pay

## 2017-08-31 ENCOUNTER — Encounter (HOSPITAL_COMMUNITY): Payer: Self-pay | Admitting: *Deleted

## 2017-08-31 ENCOUNTER — Emergency Department (HOSPITAL_COMMUNITY): Payer: Medicare Other

## 2017-08-31 ENCOUNTER — Emergency Department (HOSPITAL_COMMUNITY)
Admission: EM | Admit: 2017-08-31 | Discharge: 2017-08-31 | Disposition: A | Discharge status: Home / Self Care | Payer: Medicare Other | Attending: Emergency Medicine | Admitting: Emergency Medicine

## 2017-08-31 DIAGNOSIS — Z5321 Procedure and treatment not carried out due to patient leaving prior to being seen by health care provider: Secondary | ICD-10-CM | POA: Diagnosis not present

## 2017-08-31 DIAGNOSIS — R42 Dizziness and giddiness: Secondary | ICD-10-CM | POA: Insufficient documentation

## 2017-08-31 DIAGNOSIS — R1013 Epigastric pain: Secondary | ICD-10-CM | POA: Insufficient documentation

## 2017-08-31 DIAGNOSIS — R079 Chest pain, unspecified: Secondary | ICD-10-CM | POA: Diagnosis not present

## 2017-08-31 LAB — BASIC METABOLIC PANEL
Anion gap: 12 (ref 5–15)
BUN: 25 mg/dL — AB (ref 6–20)
CALCIUM: 9 mg/dL (ref 8.9–10.3)
CO2: 21 mmol/L — ABNORMAL LOW (ref 22–32)
CREATININE: 1.9 mg/dL — AB (ref 0.61–1.24)
Chloride: 103 mmol/L (ref 101–111)
GFR, EST AFRICAN AMERICAN: 39 mL/min — AB (ref >60)
GFR, EST NON AFRICAN AMERICAN: 34 mL/min — AB (ref >60)
Glucose, Bld: 176 mg/dL — ABNORMAL HIGH (ref 65–99)
Potassium: 4.4 mmol/L (ref 3.5–5.1)
SODIUM: 136 mmol/L (ref 135–145)

## 2017-08-31 LAB — TROPONIN I

## 2017-08-31 LAB — CBC
HCT: 41.7 % (ref 39.0–52.0)
Hemoglobin: 13.9 g/dL (ref 13.0–17.0)
MCH: 28.7 pg (ref 26.0–34.0)
MCHC: 33.3 g/dL (ref 30.0–36.0)
MCV: 86.2 fL (ref 78.0–100.0)
PLATELETS: 225 10*3/uL (ref 150–400)
RBC: 4.84 MIL/uL (ref 4.22–5.81)
RDW: 20.6 % — AB (ref 11.5–15.5)
WBC: 13.9 10*3/uL — AB (ref 4.0–10.5)

## 2017-08-31 NOTE — ED Notes (Signed)
Received called from registration that pt left.

## 2017-08-31 NOTE — ED Triage Notes (Addendum)
Pt reports epigastric pain with dizziness that began today. Pt also c/o RLQ abdominal pain. Pt had stent placed a year ago. NAD noted. Pt reports taking 1 nitro PTA without any relief. Pt also took 162 mg ASA.

## 2017-09-02 ENCOUNTER — Other Ambulatory Visit: Payer: Self-pay

## 2017-09-02 ENCOUNTER — Encounter (HOSPITAL_COMMUNITY): Payer: Self-pay

## 2017-09-02 ENCOUNTER — Emergency Department (HOSPITAL_COMMUNITY): Payer: Medicare Other

## 2017-09-02 ENCOUNTER — Emergency Department (HOSPITAL_COMMUNITY)
Admission: EM | Admit: 2017-09-02 | Discharge: 2017-09-02 | Disposition: A | Discharge status: Home / Self Care | Payer: Medicare Other | Attending: Emergency Medicine | Admitting: Emergency Medicine

## 2017-09-02 DIAGNOSIS — R0602 Shortness of breath: Secondary | ICD-10-CM | POA: Diagnosis not present

## 2017-09-02 DIAGNOSIS — I130 Hypertensive heart and chronic kidney disease with heart failure and stage 1 through stage 4 chronic kidney disease, or unspecified chronic kidney disease: Secondary | ICD-10-CM | POA: Insufficient documentation

## 2017-09-02 DIAGNOSIS — R1011 Right upper quadrant pain: Secondary | ICD-10-CM

## 2017-09-02 DIAGNOSIS — R072 Precordial pain: Secondary | ICD-10-CM | POA: Insufficient documentation

## 2017-09-02 DIAGNOSIS — N183 Chronic kidney disease, stage 3 (moderate): Secondary | ICD-10-CM | POA: Insufficient documentation

## 2017-09-02 DIAGNOSIS — I2583 Coronary atherosclerosis due to lipid rich plaque: Secondary | ICD-10-CM | POA: Insufficient documentation

## 2017-09-02 DIAGNOSIS — I509 Heart failure, unspecified: Secondary | ICD-10-CM | POA: Diagnosis not present

## 2017-09-02 DIAGNOSIS — E1122 Type 2 diabetes mellitus with diabetic chronic kidney disease: Secondary | ICD-10-CM | POA: Insufficient documentation

## 2017-09-02 DIAGNOSIS — Z87891 Personal history of nicotine dependence: Secondary | ICD-10-CM | POA: Diagnosis not present

## 2017-09-02 DIAGNOSIS — I2510 Atherosclerotic heart disease of native coronary artery without angina pectoris: Secondary | ICD-10-CM | POA: Insufficient documentation

## 2017-09-02 LAB — BASIC METABOLIC PANEL
Anion gap: 10 (ref 5–15)
BUN: 25 mg/dL — AB (ref 6–20)
CHLORIDE: 102 mmol/L (ref 101–111)
CO2: 20 mmol/L — ABNORMAL LOW (ref 22–32)
Calcium: 8.8 mg/dL — ABNORMAL LOW (ref 8.9–10.3)
Creatinine, Ser: 1.91 mg/dL — ABNORMAL HIGH (ref 0.61–1.24)
GFR, EST AFRICAN AMERICAN: 39 mL/min — AB (ref >60)
GFR, EST NON AFRICAN AMERICAN: 33 mL/min — AB (ref >60)
Glucose, Bld: 123 mg/dL — ABNORMAL HIGH (ref 65–99)
POTASSIUM: 4.1 mmol/L (ref 3.5–5.1)
Sodium: 132 mmol/L — ABNORMAL LOW (ref 135–145)

## 2017-09-02 LAB — CBC
HEMATOCRIT: 41.7 % (ref 39.0–52.0)
Hemoglobin: 13.9 g/dL (ref 13.0–17.0)
MCH: 29.1 pg (ref 26.0–34.0)
MCHC: 33.3 g/dL (ref 30.0–36.0)
MCV: 87.2 fL (ref 78.0–100.0)
PLATELETS: 196 10*3/uL (ref 150–400)
RBC: 4.78 MIL/uL (ref 4.22–5.81)
RDW: 20.2 % — AB (ref 11.5–15.5)
WBC: 11.8 10*3/uL — ABNORMAL HIGH (ref 4.0–10.5)

## 2017-09-02 LAB — TROPONIN I: Troponin I: <0.03 ng/mL (ref <0.03)

## 2017-09-02 MED ORDER — HYDRALAZINE HCL 25 MG PO TABS: 25.0000 mg | ORAL_TABLET | Freq: Three times a day (TID) | ORAL | 0 refills | Status: DC

## 2017-09-02 MED ORDER — HYDRALAZINE HCL 25 MG PO TABS
50.0000 mg | ORAL_TABLET | Freq: Once | ORAL | Status: AC
  Administered 2017-09-02: 50 mg via ORAL
  Filled 2017-09-02: qty 2

## 2017-09-02 MED ORDER — OXYCODONE-ACETAMINOPHEN 5-325 MG PO TABS
2.0000 | ORAL_TABLET | Freq: Once | ORAL | Status: AC
  Administered 2017-09-02: 2 via ORAL
  Filled 2017-09-02: qty 2

## 2017-09-02 NOTE — Discharge Instructions (Signed)
Your testing did not show any signs of heart attack. Take your medications exactly as prescribed by her doctor We have added hydralazine 3 times a day take your blood pressure 1 hour after taking her blood pressure medication and keep this recorded See your doctor in 2 days for recheck

## 2017-09-02 NOTE — ED Provider Notes (Signed)
[facility] EMERGENCY DEPARTMENT Provider Note   CSN: [id number] Arrival date & time: 09/02/17  1418     History   Chief Complaint Chief Complaint  Patient presents with  . Chest Pain    HPI [name] is a 72 y.o. male.  HPI  The pt is a 72 y/o male - has hx of CAD - has had a stent placed in the past but has never had an MI - he has had ongoing chest pain which she states has been daily for the last several months if not the entire year but it has been worsened over the last several days.  It is short-lived, middle of the chest, radiates to the back and then it goes away spontaneously.  It is not associated with shortness of breath fevers chills or coughing, he has no swelling of the legs.  He does have some chronic abdominal discomfort as well in the right upper quadrant and flank.  He has been evaluated for this multiple times.  As far as his chest pain goes this seems to be worsened over the last couple of days, he has no shortness of breath with it, no radiation of the pain, no exertional symptoms and reports that he is able to walk over a mile without any chest discomfort at all.  He did note that his blood pressure was severely elevated at 916 systolic today and it is been running very high over 180/190 most days despite taking amlodipine and lisinopril as prescribed  Past Medical History:  Diagnosis Date  . Anxiety   . Arthritis   . BPH (benign prostatic hyperplasia)   . CAD (coronary artery disease)    DES to circumflex 07/2016, moderate residual LAD and RCA, small 90% OM3 - managed medically  . Chronic lower back pain   . Depression   . Essential hypertension   . Gastritis   . GERD (gastroesophageal reflux disease)   . History of kidney stones   . Leukemia ([place])   . 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 Pneumonia X 1  . Renal insufficiency   . Spleen enlarged   . Type II diabetes mellitus [facility])     Patient  Active Problem List   Diagnosis Date Noted  . Acute CHF (congestive heart failure) ([place]) 09/15/2016  . Essential hypertension 08/14/2016  . Headache 08/14/2016  . Hyperlipidemia LDL goal <70 08/14/2016  . Abnormal nuclear stress test   . 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 Acute respiratory failure with hypoxia ([place]) 04/27/2016  . Hypoxia   . Hematoma 04/25/2016  . Mass of chest wall, left   . Abnormal partial thromboplastin time (PTT)   . 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 Coronary artery disease due to lipid rich plaque 04/15/2016  . Leukocytosis 04/15/2016  . Diabetes mellitus ([place]) 04/15/2016  . Chest pain 04/15/2016  . CKD (chronic kidney disease) stage 3, GFR 30-59 ml/min (HCC) 04/15/2016  . Left shoulder pain 04/15/2016  . Encounter for screening colonoscopy 01/21/2013  . GERD (gastroesophageal reflux disease) 01/21/2013    Past Surgical History:  Procedure Laterality Date  . BACK SURGERY    . BONE MARROW BIOPSY     x 3 times  . CARDIAC CATHETERIZATION N/A 08/12/2016   Procedure: Left Heart Cath and Coronary Angiography;  Surgeon: Jettie Booze, MD;  Location: [place] CV LAB;  Service: Cardiovascular;  Laterality: N/A;  . CARDIAC CATHETERIZATION N/A 08/12/2016  Procedure: Coronary Stent Intervention;  Surgeon: Jettie Booze, MD;  Location: [place] CV LAB;  Service: Cardiovascular;  Laterality: N/A;  . CARDIAC CATHETERIZATION  2000s X 1  . COLONOSCOPY WITH PROPOFOL N/A 02/01/2013   SLF: 1. 23 colon polyps removed. 2 retrieved. 2. Mild diverticulosis in teh sigmoid colon 3. Small internal hemorrhoids 4. The colon is redundant   . CYSTOSCOPY W/ STONE MANIPULATION    . ESOPHAGOGASTRODUODENOSCOPY (EGD) WITH PROPOFOL N/A 02/01/2013   SLF: 1. Moderate erosive gastritis  . [place]  . POLYPECTOMY N/A 02/01/2013   Procedure: POLYPECTOMY;  Surgeon: Danie Binder, MD;  Location: [facility];  Service: Endoscopy;  Laterality: N/A;        Home Medications    Prior to Admission medications   Medication Sig Start Date End Date Taking? Authorizing Provider  acetaminophen (TYLENOL) 500 MG tablet Take 1,000 mg by mouth 2 (two) times daily as needed for mild pain or moderate pain.     [provider]  amLODipine (NORVASC) 10 MG tablet Take 1 tablet by mouth daily. 12/26/16   [provider]  atorvastatin (LIPITOR) 80 MG tablet Take 1 tablet by mouth daily. 08/14/16   [provider]  cyclobenzaprine (FLEXERIL) 5 MG tablet Take 1 tablet by mouth daily. 08/08/15   [provider]  DULoxetine (CYMBALTA) 60 MG capsule Take 60 mg by mouth daily.  04/09/16   [provider]  hydrALAZINE (APRESOLINE) 25 MG tablet Take 1 tablet (25 mg total) by mouth 3 (three) times daily. 09/02/17   Noemi Chapel, MD  HYDROcodone-acetaminophen (NORCO/VICODIN) 5-325 MG tablet Take 1 tablet by mouth 3 (three) times daily as needed.  07/23/17   [provider]  lisinopril (PRINIVIL,ZESTRIL) 10 MG tablet Take 1 tablet (10 mg total) by mouth daily. 08/08/17   Mesner, Corene Cornea, MD  LORazepam (ATIVAN) 1 MG tablet Take 1 tablet before bed and take 0.5 to 1 tablet during the day as needed for anxiety/nerves. 08/04/17   [provider]  mirtazapine (REMERON) 15 MG tablet Take 1 tablet by mouth at bedtime.  05/21/17   [provider]  nitroGLYCERIN (NITROSTAT) 0.4 MG SL tablet Place 1 tablet (0.4 mg total) under the tongue every 5 (five) minutes as needed for chest pain. 09/04/16 08/08/17  Arnoldo Lenis, MD  oxyCODONE-acetaminophen (PERCOCET) 5-325 MG tablet Take 1-2 tablets by mouth every 8 (eight) hours as needed for severe pain. 08/08/17   Mesner, Corene Cornea, MD  pantoprazole (PROTONIX) 20 MG tablet Take 1 tablet (20 mg total) by mouth daily. 08/08/17   Mesner, Corene Cornea, MD  ranitidine (ZANTAC) 150 MG tablet Take 300 mg by mouth daily.  07/23/17   [provider]  ruxolitinib phosphate (JAKAFI) 5 MG  tablet Take 5-10 mg by mouth 2 (two) times daily. 81m in the morning and 163mat bedtime    [provider]  tiZANidine (ZANAFLEX) 4 MG tablet Take 1 tablet by mouth 2 (two) times daily. 07/13/17   [provider]  torsemide (DEMADEX) 20 MG tablet Take 20 mg by mouth daily as needed (for fluid).  06/15/17   [provider]  traZODone (DESYREL) 50 MG tablet Take 50 mg by mouth at bedtime as needed for sleep. 07/18/16   [provider]    Family History Family History  Problem Relation Age of Onset  . Heart attack Mother   . CVA Mother   . Heart attack Father   . Colon cancer Neg Hx  Social History Social History   Tobacco Use  . Smoking status: Former Smoker    Packs/day: 1.00    Years: 20.00    Pack years: 20.00    Types: Cigars    Start date: 06/25/1975    Last attempt to quit: 08/04/2013    Years since quitting: 4.0  . Smokeless tobacco: Never Used  Substance Use Topics  . 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 Drug use: No     Allergies   Hydrocodone   Review of Systems Review of Systems  All other systems reviewed and are negative.    Physical Exam Updated Vital Signs BP (!) 178/100   Pulse 65   Temp 97.8 F (36.6 C) (Oral)   Resp 15   Ht 6' (1.829 m)   Wt 96.2 kg (212 lb)   SpO2 96%   BMI 28.75 kg/m   Physical Exam  Constitutional: He appears well-developed and well-nourished. No distress.  HENT:  Head: Normocephalic and atraumatic.  Mouth/Throat: Oropharynx is clear and moist. No oropharyngeal exudate.  Eyes: Conjunctivae and EOM are normal. Pupils are equal, round, and reactive to light. Right eye exhibits no discharge. Left eye exhibits no discharge. No scleral icterus.  Neck: Normal range of motion. Neck supple. No JVD present. No thyromegaly present.  Cardiovascular: Normal rate, regular rhythm, normal heart sounds and intact distal pulses. Exam reveals no  gallop and no friction rub.  No murmur heard. Pulmonary/Chest: Effort normal and breath sounds normal. No respiratory distress. He has no wheezes. He has no rales. He exhibits no tenderness.  There is no chest tenderness to palpation  Abdominal: Soft. Bowel sounds are normal. He exhibits no distension and no mass. There is tenderness ( Mild right upper quadrant and lower rib tenderness on the right.  No other abdominal tenderness).  Musculoskeletal: Normal range of motion. He exhibits no edema or tenderness.  Lymphadenopathy:    He has no cervical adenopathy.  Neurological: He is alert. Coordination normal.  Skin: Skin is warm and dry. No rash noted. No erythema.  Psychiatric: He has a normal mood and affect. His behavior is normal.  Nursing note and vitals reviewed.    ED Treatments / Results  Labs (all labs ordered are listed, but only abnormal results are displayed) Labs Reviewed  BASIC METABOLIC PANEL - Abnormal; Notable for the following components:      Result Value   Sodium 132 (*)    CO2 20 (*)    Glucose, Bld 123 (*)    BUN 25 (*)    Creatinine, Ser 1.91 (*)    Calcium 8.8 (*)    GFR calc non Af Amer 33 (*)    GFR calc Af Amer 39 (*)    All other components within normal limits  CBC - Abnormal; Notable for the following components:   WBC 11.8 (*)    RDW 20.2 (*)    All other components within normal limits  TROPONIN I  TROPONIN I    EKG  EKG Interpretation  Date/Time:  Wednesday September 02 2017 14:24:29 EST Ventricular Rate:  71 PR Interval:  142 QRS Duration: 82 QT Interval:  414 QTC Calculation: 449 R Axis:   -7 Text Interpretation:  Normal sinus rhythm Minimal voltage criteria for LVH, may be normal variant Nonspecific ST and T wave abnormality Abnormal ECG since last tracing no significant change Confirmed by Noemi Chapel [phone])  on 09/02/2017 4:38:11 PM       EKG Interpretation  Date/Time:  Wednesday September 02 2017 18:09:47 EST Ventricular Rate:   69 PR Interval:  142 QRS Duration: 101 QT Interval:  354 QTC Calculation: 380 R Axis:   -8 Text Interpretation:  Sinus rhythm Atrial premature complex Abnormal R-wave progression, early transition LVH with secondary repolarization abnormality Baseline wander in lead(s) V2 V3 since last tracing no significant change Confirmed by Noemi Chapel [phone]) on 09/02/2017 6:37:43 PM        Radiology Dg Chest 2 View  Result Date: 09/02/2017 CLINICAL DATA:  72 year old male with 3 weeks of left side chest pain. Shortness of breath. EXAM: CHEST  2 VIEW COMPARISON:  08/31/2017 and earlier. FINDINGS: Lung volumes are stable and within normal limits. Mediastinal contours remain normal. Visualized tracheal air column is within normal limits. No pneumothorax, pulmonary edema, pleural effusion or confluent pulmonary opacity. No acute osseous abnormality identified. Negative visible bowel gas pattern. IMPRESSION: Stable and negative.  No acute cardiopulmonary abnormality. Electronically Signed   By: Genevie Ann M.D.   On: 09/02/2017 18:56    Procedures Procedures (including critical care time)  Medications Ordered in ED Medications  hydrALAZINE (APRESOLINE) tablet 50 mg (50 mg Oral Given 09/02/17 1815)  oxyCODONE-acetaminophen (PERCOCET/ROXICET) 5-325 MG per tablet 2 tablet (2 tablets Oral Given 09/02/17 2009)     Initial Impression / Assessment and Plan / ED Course  I have reviewed the triage vital signs and the nursing notes.  Pertinent labs & imaging results that were available during my care of the patient were reviewed by me and considered in my medical decision making (see chart for details).    The patient has no edema of his legs, no tachycardia, no hypoxia and no pulmonary symptoms.  He has a negative troponin and his EKG does not show acute ischemia.  Will obtain a second troponin and EKG but I anticipate the patient will be discharged.  Secondly the patient's blood pressure is significantly  elevated, he will need to have additional medication started in addition to what he is taking at home as he has had persistent hypertension despite taking his home meds as prescribed.  The patient is in agreement with this plan.  He does not have any radiating symptoms to his arms, he has equal pulses at the bilateral radial arteries, I do not think this is an aortic dissection especially since it is intermittent and daily ever since he had a stents placed.  He has no exertional symptoms making an acute coronary syndrome less likely as well.  Second trop is negative, the pt has ongoing R flank pain and is requesting pain meds for this "rib pain" - this is different than the central chest pain he has had for a long time and for which we worked up [employer] with 2 troponins.  CXR w out acute findings Cr at baseline. CT of abd done a month ago showed no acuyte findings for the same RUQ pain Pt agreeable to d/c with f/u.  Hydralazine given for his HTN, he will be started on this as an oupatient. He is agreeable to taking this TID   Final Clinical Impressions(s) / ED Diagnoses   Final diagnoses:  Right upper quadrant abdominal pain  Precordial pain    ED Discharge Orders        Ordered    hydrALAZINE (APRESOLINE) 25 MG tablet  3 times daily     09/02/17 2000  Noemi Chapel, MD 09/02/17 2151

## 2017-09-02 NOTE — ED Triage Notes (Signed)
Patient reports of left sided chest pain with shortness of breath that started x1 week ago. Was here the other night waited and left due to wait time. STates pain is getting worse.

## 2017-09-02 NOTE — ED Notes (Signed)
Pt updated in waiting area.  Pt states his pain is about the same.

## 2017-09-08 DIAGNOSIS — D469 Myelodysplastic syndrome, unspecified: Secondary | ICD-10-CM | POA: Diagnosis not present

## 2017-09-08 DIAGNOSIS — Z885 Allergy status to narcotic agent status: Secondary | ICD-10-CM | POA: Diagnosis not present

## 2017-09-08 DIAGNOSIS — R109 Unspecified abdominal pain: Secondary | ICD-10-CM | POA: Diagnosis not present

## 2017-09-17 ENCOUNTER — Encounter: Payer: Self-pay | Admitting: Cardiology

## 2017-09-17 ENCOUNTER — Ambulatory Visit (INDEPENDENT_AMBULATORY_CARE_PROVIDER_SITE_OTHER): Payer: Medicare Other | Admitting: Cardiology

## 2017-09-17 ENCOUNTER — Other Ambulatory Visit: Payer: Self-pay

## 2017-09-17 VITALS — BP 187/103 | HR 75 | Ht 72.0 in | Wt 207.0 lb

## 2017-09-17 DIAGNOSIS — R109 Unspecified abdominal pain: Secondary | ICD-10-CM | POA: Diagnosis not present

## 2017-09-17 DIAGNOSIS — I5032 Chronic diastolic (congestive) heart failure: Secondary | ICD-10-CM | POA: Diagnosis not present

## 2017-09-17 DIAGNOSIS — I10 Essential (primary) hypertension: Secondary | ICD-10-CM | POA: Diagnosis not present

## 2017-09-17 DIAGNOSIS — I2510 Atherosclerotic heart disease of native coronary artery without angina pectoris: Secondary | ICD-10-CM

## 2017-09-17 MED ORDER — AMLODIPINE BESYLATE 10 MG PO TABS: 10.0000 mg | ORAL_TABLET | Freq: Every day | ORAL | 1 refills | Status: DC

## 2017-09-17 NOTE — Patient Instructions (Signed)
Your physician recommends that you schedule a follow-up appointment in: [facility] has recommended you make the following change in your medication:   START AMLODIPINE 10 MG DAILY  You have been referred to DR [place] has requested that you regularly monitor and record your blood pressure readings at home FOR 1 [place]. Please use the same machine at the same time of day to check your readings and record them to bring to your follow-up visit.  Thank you for choosing [facility]!!

## 2017-09-17 NOTE — Progress Notes (Signed)
Clinical Summary Mr. Mikesell is a 72 y.o.male seen today for follow up of the following medical problems.   1. CAD - recent chest pain 3-4 week ago. Pressure like feeling midchest, 7/10 in severity. Isolated episode while sitting. +SOB. Had some diaphoresis. Lasted about 45 minutes. Not positional. [facility] Majestic to [facility], refused admissions - since that time mild "twinges" I nchest. No SOB or DOE, though fairly sedentary lifestyle.  - nuclear stress test at [facility] showed large area of inferolateral ischemia.  - CAD risk factors: HTN, DM2, +tobacco cigars several years. Father CVA late 60s. Mother with CAD late 63s.   - admit 03/2016 with atypical chest pain, thought to be shoulder issue based on MRI. Admitted a few weeks later with left chest wall hematoma, unclear etiology.  - continues to have sharp pain midchest, 6/10 in severity. Left hand can go numb. +SOB. Worst with deep breaths. Lasts just a few seconds. Can occur at rest or with activity. Some increase in severithy.    - cath Jan 2018, received DES to LCX. 90% OM3 small vessel, treated medically. 60% RCA medically managed - no ACE-I due to poor renal function   - ER visit 08/2017 with chest pain/RUQ pain. Trop neg x2, EKG without acute ischemic changes - prior visit Jan 2019 for RUQ pain. CT and RUQ [place] benign.    - epigastric pain, nonspecific pain. Up 8/10 in severity. Can occur at rest or with activity. Can start RLQ abdomen and cross into midchest. Can have SOB. Can be positional at times. Can last 24 hrs without relief.  - no new DOE. Tolerates daily activities well -   - his kidney doctor stopped ASA, atorvastatin, Toprol per patient report   2. CKD -followed by nephrolog  3. Thrombocytopenia - followed by hematology, recent values have been improving.    4. Chronic diastolic HF - no recent symptoms. No SOB/DOE/LE edema   5. HTN - ACE-I stopped due to prior hyperklaemia.   6. Bradycardia - noted at  02/2017 kidney doctor, toprol stopped at that time    Past Medical History:  Diagnosis Date  . Anxiety   . Arthritis   . BPH (benign prostatic hyperplasia)   . CAD (coronary artery disease)    DES to circumflex 07/2016, moderate residual LAD and RCA, small 90% OM3 - managed medically  . Chronic lower back pain   . Depression   . Essential hypertension   . Gastritis   . GERD (gastroesophageal reflux disease)   . History of kidney stones   . Leukemia ([place])   . 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 Pneumonia X 1  . Renal insufficiency   . Spleen enlarged   . Type II diabetes mellitus (HCC)      Allergies  Allergen Reactions  . Hydrocodone Itching     Current Outpatient Medications  Medication Sig Dispense Refill  . acetaminophen (TYLENOL) 500 MG tablet Take 1,000 mg by mouth 2 (two) times daily as needed for mild pain or moderate pain.     Marland Kitchen amLODipine (NORVASC) 10 MG tablet Take 1 tablet by mouth daily.    Marland Kitchen atorvastatin (LIPITOR) 80 MG tablet Take 1 tablet by mouth daily.    . cyclobenzaprine (FLEXERIL) 5 MG tablet Take 1 tablet by mouth daily.    . DULoxetine (CYMBALTA) 60 MG capsule Take 60 mg by mouth daily.     . hydrALAZINE (APRESOLINE) 25 MG  tablet Take 1 tablet (25 mg total) by mouth 3 (three) times daily. 90 tablet 0  . HYDROcodone-acetaminophen (NORCO/VICODIN) 5-325 MG tablet Take 1 tablet by mouth 3 (three) times daily as needed.     Marland Kitchen lisinopril (PRINIVIL,ZESTRIL) 10 MG tablet Take 1 tablet (10 mg total) by mouth daily. 30 tablet 0  . LORazepam (ATIVAN) 1 MG tablet Take 1 tablet before bed and take 0.5 to 1 tablet during the day as needed for anxiety/nerves.    . mirtazapine (REMERON) 15 MG tablet Take 1 tablet by mouth at bedtime.   2  . nitroGLYCERIN (NITROSTAT) 0.4 MG SL tablet Place 1 tablet (0.4 mg total) under the tongue every 5 (five) minutes as needed for chest pain. 25 tablet 3  . oxyCODONE-acetaminophen  (PERCOCET) 5-325 MG tablet Take 1-2 tablets by mouth every 8 (eight) hours as needed for severe pain. 10 tablet 0  . pantoprazole (PROTONIX) 20 MG tablet Take 1 tablet (20 mg total) by mouth daily. 30 tablet 0  . ranitidine (ZANTAC) 150 MG tablet Take 300 mg by mouth daily.     . ruxolitinib phosphate (JAKAFI) 5 MG tablet Take 5-10 mg by mouth 2 (two) times daily. '5mg'$  in the morning and '10mg'$  at bedtime    . tiZANidine (ZANAFLEX) 4 MG tablet Take 1 tablet by mouth 2 (two) times daily.    Marland Kitchen torsemide (DEMADEX) 20 MG tablet Take 20 mg by mouth daily as needed (for fluid).     . traZODone (DESYREL) 50 MG tablet Take 50 mg by mouth at bedtime as needed for sleep.     No current facility-administered medications for this visit.      Past Surgical History:  Procedure Laterality Date  . BACK SURGERY    . BONE MARROW BIOPSY     x 3 times  . CARDIAC CATHETERIZATION N/A 08/12/2016   Procedure: Left Heart Cath and Coronary Angiography;  Surgeon: Jettie Booze, MD;  Location: [place] CV LAB;  Service: Cardiovascular;  Laterality: N/A;  . CARDIAC CATHETERIZATION N/A 08/12/2016   Procedure: Coronary Stent Intervention;  Surgeon: Jettie Booze, MD;  Location: [place] CV LAB;  Service: Cardiovascular;  Laterality: N/A;  . CARDIAC CATHETERIZATION  2000s X 1  . COLONOSCOPY WITH PROPOFOL N/A 02/01/2013   SLF: 1. 23 colon polyps removed. 2 retrieved. 2. Mild diverticulosis in teh sigmoid colon 3. Small internal hemorrhoids 4. The colon is redundant   . CYSTOSCOPY W/ STONE MANIPULATION    . ESOPHAGOGASTRODUODENOSCOPY (EGD) WITH PROPOFOL N/A 02/01/2013   SLF: 1. Moderate erosive gastritis  . [place]  . POLYPECTOMY N/A 02/01/2013   Procedure: POLYPECTOMY;  Surgeon: Danie Binder, MD;  Location: [facility];  Service: Endoscopy;  Laterality: N/A;     Allergies  Allergen Reactions  . Hydrocodone Itching      Family History  Problem Relation Age of Onset  . Heart attack  Mother   . CVA Mother   . Heart attack Father   . Colon cancer Neg Hx      Social History Mr. Resor reports that he quit smoking about 4 years ago. His smoking use included cigars. He started smoking about 42 years ago. He has a 20.00 pack-year smoking history. he has never used smokeless tobacco. Mr. Mowers reports that he does not drink alcohol.   Review of Systems CONSTITUTIONAL: No weight loss, fever, chills, weakness or fatigue.  HEENT: Eyes: No visual loss, blurred vision, double vision or yellow  sclerae.No hearing loss, sneezing, congestion, runny nose or sore throat.  SKIN: No rash or itching.  CARDIOVASCULAR: per hpi RESPIRATORY: per hpi GASTROINTESTINAL: No anorexia, nausea, vomiting or diarrhea. No abdominal pain or blood.  GENITOURINARY: No burning on urination, no polyuria NEUROLOGICAL: No headache, dizziness, syncope, paralysis, ataxia, numbness or tingling in the extremities. No change in bowel or bladder control.  MUSCULOSKELETAL: No muscle, back pain, joint pain or stiffness.  LYMPHATICS: No enlarged nodes. No history of splenectomy.  PSYCHIATRIC: No history of depression or anxiety.  ENDOCRINOLOGIC: No reports of sweating, cold or heat intolerance. No polyuria or polydipsia.  Marland Kitchen   Physical Examination Vitals:   09/17/17 1338  BP: (!) 187/103  Pulse: 75  SpO2: 99%   Vitals:   09/17/17 1338  Weight: 207 lb (93.9 kg)  Height: 6' (1.829 m)    Gen: resting comfortably, no acute distress HEENT: no scleral icterus, pupils equal round and reactive, no palptable cervical adenopathy,  CV: RRR, no m/rg no jvd Resp: Clear to auscultation bilaterally GI: abdomen is soft, non-tender, non-distended, normal bowel sounds, no hepatosplenomegaly MSK: extremities are warm, no edema.  Skin: warm, no rash Neuro:  no focal deficits Psych: appropriate affect   Diagnostic Studies Jan 2018 cath  Dist RCA lesion, 60 %stenosed.  3rd Mrg lesion, 90 %stenosed. This was  a small vessel.  Mid LAD lesion, 40 %stenosed.  Ost LAD lesion, 25 %stenosed.  Mid Cx lesion, 95 %stenosed. This was the culprit lesion. A STENT SYNERGY DES 3X20 drug eluting stent was successfully placed., postdilated to 3.5  Post intervention, there is a 0% residual stenosis.  LV end diastolic pressure is normal.  There is no aortic valve stenosis.  Significant tortuosity in the right subclavian. If emergency cath was needed in the future, would not use right radial approach.  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 A Synergy stent was used to give him the benefit of a drug-eluting stent but also allow for shortening of antiplatelet therapy duration. For now, continue dual antiplatelet therapy. His platelet count is normal. I will speak to Dr. Harl Bowie regarding this issue.    Assessment and Plan  1.CAD/Chest pain - recent symptoms atypical for cardiac etiology - medications limited by renal dysfnction, hyperkalemia in the past, and bradycardia  2. HTN - elevated in clinic. We will start norvasc '10mg'$  daily. Present bp log in 1 week  3. Chronic diastolic HF - at goal, continue current meds  4. Abdominal pain - refer to GI    Arnoldo Lenis, M.D.

## 2017-09-18 ENCOUNTER — Encounter: Payer: Self-pay | Admitting: Gastroenterology

## 2017-09-22 ENCOUNTER — Encounter: Payer: Self-pay | Admitting: Cardiology

## 2017-09-23 DIAGNOSIS — Z6831 Body mass index (BMI) 31.0-31.9, adult: Secondary | ICD-10-CM | POA: Diagnosis not present

## 2017-09-23 DIAGNOSIS — M545 Low back pain: Secondary | ICD-10-CM | POA: Diagnosis not present

## 2017-09-23 DIAGNOSIS — Z299 Encounter for prophylactic measures, unspecified: Secondary | ICD-10-CM | POA: Diagnosis not present

## 2017-09-23 DIAGNOSIS — D471 Chronic myeloproliferative disease: Secondary | ICD-10-CM | POA: Diagnosis not present

## 2017-09-23 DIAGNOSIS — I10 Essential (primary) hypertension: Secondary | ICD-10-CM | POA: Diagnosis not present

## 2017-10-20 DIAGNOSIS — Z87442 Personal history of urinary calculi: Secondary | ICD-10-CM | POA: Diagnosis not present

## 2017-10-20 DIAGNOSIS — R1011 Right upper quadrant pain: Secondary | ICD-10-CM | POA: Diagnosis not present

## 2017-10-20 DIAGNOSIS — F329 Major depressive disorder, single episode, unspecified: Secondary | ICD-10-CM | POA: Diagnosis not present

## 2017-10-20 DIAGNOSIS — Z87898 Personal history of other specified conditions: Secondary | ICD-10-CM | POA: Diagnosis not present

## 2017-10-20 DIAGNOSIS — Z79899 Other long term (current) drug therapy: Secondary | ICD-10-CM | POA: Diagnosis not present

## 2017-10-20 DIAGNOSIS — I509 Heart failure, unspecified: Secondary | ICD-10-CM | POA: Diagnosis not present

## 2017-10-20 DIAGNOSIS — N179 Acute kidney failure, unspecified: Secondary | ICD-10-CM | POA: Diagnosis not present

## 2017-10-20 DIAGNOSIS — Z885 Allergy status to narcotic agent status: Secondary | ICD-10-CM | POA: Diagnosis not present

## 2017-10-20 DIAGNOSIS — F419 Anxiety disorder, unspecified: Secondary | ICD-10-CM | POA: Diagnosis not present

## 2017-10-20 DIAGNOSIS — D469 Myelodysplastic syndrome, unspecified: Secondary | ICD-10-CM | POA: Diagnosis not present

## 2017-10-20 DIAGNOSIS — Z87828 Personal history of other (healed) physical injury and trauma: Secondary | ICD-10-CM | POA: Diagnosis not present

## 2017-10-23 DIAGNOSIS — Z299 Encounter for prophylactic measures, unspecified: Secondary | ICD-10-CM | POA: Diagnosis not present

## 2017-10-23 DIAGNOSIS — E1122 Type 2 diabetes mellitus with diabetic chronic kidney disease: Secondary | ICD-10-CM | POA: Diagnosis not present

## 2017-10-23 DIAGNOSIS — I10 Essential (primary) hypertension: Secondary | ICD-10-CM | POA: Diagnosis not present

## 2017-10-23 DIAGNOSIS — M545 Low back pain: Secondary | ICD-10-CM | POA: Diagnosis not present

## 2017-10-23 DIAGNOSIS — Z6831 Body mass index (BMI) 31.0-31.9, adult: Secondary | ICD-10-CM | POA: Diagnosis not present

## 2017-10-23 DIAGNOSIS — E1165 Type 2 diabetes mellitus with hyperglycemia: Secondary | ICD-10-CM | POA: Diagnosis not present

## 2017-11-18 ENCOUNTER — Ambulatory Visit (INDEPENDENT_AMBULATORY_CARE_PROVIDER_SITE_OTHER): Payer: Medicare Other | Admitting: Gastroenterology

## 2017-11-18 ENCOUNTER — Encounter: Payer: Self-pay | Admitting: Gastroenterology

## 2017-11-18 ENCOUNTER — Other Ambulatory Visit: Payer: Self-pay

## 2017-11-18 ENCOUNTER — Telehealth: Payer: Self-pay

## 2017-11-18 DIAGNOSIS — R1013 Epigastric pain: Secondary | ICD-10-CM

## 2017-11-18 DIAGNOSIS — R1011 Right upper quadrant pain: Secondary | ICD-10-CM | POA: Insufficient documentation

## 2017-11-18 DIAGNOSIS — Z86010 Personal history of colonic polyps: Secondary | ICD-10-CM

## 2017-11-18 DIAGNOSIS — K219 Gastro-esophageal reflux disease without esophagitis: Secondary | ICD-10-CM

## 2017-11-18 DIAGNOSIS — I2510 Atherosclerotic heart disease of native coronary artery without angina pectoris: Secondary | ICD-10-CM

## 2017-11-18 MED ORDER — NA SULFATE-K SULFATE-MG SULF 17.5-3.13-1.6 GM/177ML PO SOLN: 1.0000 | ORAL | 0 refills | Status: DC

## 2017-11-18 MED ORDER — PANTOPRAZOLE SODIUM 40 MG PO TBEC: DELAYED_RELEASE_TABLET | ORAL | 11 refills | Status: DC

## 2017-11-18 NOTE — Assessment & Plan Note (Signed)
NO WARNING SIGNS/SYMPTOMS. HAD 23 POLY[S REMOVED IN AUG 2014.  COMPLETE COLONOSCOPY WITH PROPOFOL FOR SEDATION DUE TO POLYPHARMACY IN 2-3 WEEKS. YOU MAY BRING THE ENEMA TO ADMINISTER IN THE PREOP AREA. DO NOT DRINK BROTH WITH OIL/FAT DROPLETS IN IT ON THE DAY BEFORE YOUR COLONOSCOPY. DISCUSSED PROCEDURE, BENEFITS, & RISKS: < 1% chance of medication reaction, bleeding, perforation, or rupture of spleen/liver. FOLLOW UP IN 4 MOS.

## 2017-11-18 NOTE — Assessment & Plan Note (Addendum)
MOST LIKELY DUE TO JAKAFI.DIFFERENTIAL DIAGNOSIS INCLUDES: H PYLORI GASTRITIS, UNCONTROLLED GERD,  LESS LIKELY GE JUNCTION TUMOR, OR CHRONIC MESENTERIC ISCHEMIA.  DRINK WATER TO KEEP YOUR URINE LIGHT YELLOW. AVOID REFLUX TRIGGERS.  HANDOUT GIVEN. START PROTONIX. TAKE 30 MINUTES PRIOR TO BREAKFAST. EGD FOR NEW ONSET DYSPEPSIA. DISCUSSED PROCEDURE, BENEFITS, & RISKS: < 1% chance of medication reaction, bleeding, OR perforation. USE ZANTAC WHEN NEEDED TO CONTROLYOUR HEARTBURN.  FOLLOW UP IN 4 MOS.

## 2017-11-18 NOTE — Progress Notes (Signed)
ON RECALL  °

## 2017-11-18 NOTE — Progress Notes (Signed)
CC'ED TO PCP 

## 2017-11-18 NOTE — Progress Notes (Signed)
 Subjective:    Patient ID: [name], male    DOB: 05/20/1946, 72 y.o.   MRN: [id number]  Shah, Ashish, MD   HPI Pain in RUQ(SHARP/PRESSURE IN HIS CHEST, LASTS SHARP: 2-3 MINS OTHERWISE DULL ACHE) and radiating to right chest. PAIN BEEN THERE SINCE STENT PUT IN 2017. WORKUP INCLUDED CT/ U/S JAN 2019: A LITTLE FAT IN LIVER. NOT REALLY REALLY RELATED TO FOOD. IF EAT TOO MUCH MAKES IT WORSE. DOESN'T KEEP HIM AWAKE AND NOT WAKING HIM UP IN THE MIDDLE OF THE NIGHT.  SHARP STABBING PAIN HAPPENS: EVERY OTHER DAY. CAN FEELS LIKE IT TAKES HIS BREATH. IN PAST WEEK HASN'T HAD PAIN IN CHEST. FEELS LIKE HE HAS HEARTBURN ALL THE TIME WHICH DOES KEEP HIM AWAKE. NO PAIN WITH SWALLOWING. ON JAKAFI IN 2018 AND DOSE WAS REDUCED DUE TO NVD AND ABDOMINAL PAIN.  PT DENIES FEVER, CHILLS, HEMATOCHEZIA, HEMATEMESIS, nausea, vomiting, melena, diarrhea, CHANGE IN BOWEL IN HABITS, constipation, problems swallowing, OR problems with sedation.  Past Medical History:  Diagnosis Date  . Anxiety   . Arthritis   . BPH (benign prostatic hyperplasia)   . CAD (coronary artery disease)    DES to circumflex 07/2016, moderate residual LAD and RCA, small 90% OM3 - managed medically  . Chronic lower back pain   . Depression   . Essential hypertension   . Gastritis   . GERD (gastroesophageal reflux disease)   . History of kidney stones   . Leukemia (HCC)   . 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 Pneumonia X 1  . Renal insufficiency   . Spleen enlarged   . Type II diabetes mellitus (HCC)     Past Surgical History:  Procedure Laterality Date  . BACK SURGERY    . BONE MARROW BIOPSY     x 3 times  . CARDIAC CATHETERIZATION N/A 08/12/2016   Procedure: Left Heart Cath and Coronary Angiography;  Surgeon: Jayadeep S Varanasi, MD;  Location: [facility];  Service: Cardiovascular;  Laterality: N/A;  . CARDIAC CATHETERIZATION N/A 08/12/2016   Procedure: Coronary  Stent Intervention;  Surgeon: Jayadeep S Varanasi, MD;  Location: [facility];  Service: Cardiovascular;  Laterality: N/A;  . CARDIAC CATHETERIZATION  2000s X 1  . COLONOSCOPY WITH PROPOFOL N/A 02/01/2013   SLF: 1. 23 colon polyps removed. 2 retrieved. 2. Mild diverticulosis in teh sigmoid colon 3. Small internal hemorrhoids 4. The colon is redundant   . CYSTOSCOPY W/ STONE MANIPULATION    . ESOPHAGOGASTRODUODENOSCOPY (EGD) WITH PROPOFOL N/A 02/01/2013   SLF: 1. Moderate erosive gastritis  . LUMBAR DISC SURGERY  1978  . POLYPECTOMY N/A 02/01/2013   Procedure: POLYPECTOMY;  Surgeon: Sandi L Fields, MD;  Location: [facility];  Service: Endoscopy;  Laterality: N/A;   Allergies  Allergen Reactions  . Hydrocodone Itching   Current Outpatient Medications  Medication Sig Dispense Refill  . acetaminophen (TYLENOL) 500 MG tablet Take 1,000 mg by mouth 2 (two) times daily as needed for mild pain or moderate pain.     . amLODipine (NORVASC) 10 MG tablet Take 1 tablet (10 mg total) by mouth daily.    . cyclobenzaprine (FLEXERIL) 5 MG tablet Take 1 tablet by mouth daily.    . DULoxetine (CYMBALTA) 60 MG capsule Take 60 mg by mouth daily.     . HYDROcodone-acetaminophen (NORCO/VICODIN) 5-325 MG tablet Take 1 tablet by mouth 3 (three) times daily as needed.     .   nitroGLYCERIN (NITROSTAT) 0.4 MG SL tablet Place 0.4 mg under the tongue every 5 (five) minutes as needed for chest pain.    . ranitidine (ZANTAC) 150 MG tablet Take 300 mg by mouth daily.     . ruxolitinib phosphate (JAKAFI) 5 MG tablet Take 5-10 mg by mouth 2 (two) times daily. 5mg in the morning and 10mg at bedtime    . tiZANidine (ZANAFLEX) 4 MG tablet Take 1 tablet by mouth 2 (two) times daily.    . traZODone (DESYREL) 50 MG tablet Take 50 mg by mouth at bedtime as needed for sleep.    . allopurinol (ZYLOPRIM) 300 MG tablet Take 300 mg by mouth daily.    . hydrALAZINE (APRESOLINE) 25 MG tablet Take 1 tablet (25 mg total) by mouth 3 (three)  times daily. (Patient not taking: Reported on 11/18/2017) 90 tablet 0   Family History  Problem Relation Age of Onset  . Heart attack Mother   . CVA Mother   . Heart attack Father   . Colon cancer Neg Hx     Social History   Tobacco Use  . Smoking status: Former Smoker    Packs/day: 1.00    Years: 20.00    Pack years: 20.00    Types: Cigars    Start date: 06/25/1975    Last attempt to quit: 08/04/2013    Years since quitting: 4.2  . Smokeless tobacco: Never Used  Substance Use Topics  . 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 Drug use: No   Review of Systems PER HPI OTHERWISE ALL SYSTEMS ARE NEGATIVE.    Objective:   Physical Exam  Constitutional: He is oriented to person, place, and time. He appears well-developed and well-nourished. No distress.  HENT:  Head: Normocephalic and atraumatic.  Mouth/Throat: Oropharynx is clear and moist. No oropharyngeal exudate.  Eyes: Pupils are equal, round, and reactive to light. No scleral icterus.  Neck: Normal range of motion. Neck supple.  Cardiovascular: Normal rate, regular rhythm and normal heart sounds.  Pulmonary/Chest: Effort normal and breath sounds normal. No respiratory distress.  Abdominal: Soft. Bowel sounds are normal. He exhibits no distension. There is tenderness. There is no rebound and no guarding.  MILD TO MODERATE TTP X4  Musculoskeletal: He exhibits no edema.  Lymphadenopathy:    He has no cervical adenopathy.  Neurological: He is alert and oriented to person, place, and time.  NO  NEW FOCAL DEFICITS  Psychiatric:  FLAT AFFECT, NL MOOD  Vitals reviewed.      Assessment & Plan:   

## 2017-11-18 NOTE — Telephone Encounter (Signed)
Called and informed pt of pre-op appt 12/01/17 at 10:00am. Letter mailed.

## 2017-11-18 NOTE — Assessment & Plan Note (Signed)
SYMPTOMS NOT CONTROLLED ON ZANTAC QHS.  DRINK WATER TO KEEP YOUR URINE LIGHT YELLOW. AVOID REFLUX TRIGGERS.  HANDOUT GIVEN. CONTINUE PROTONIX. TAKE 30 MINUTES PRIOR TO BREAKFAST. USE ZANTAC WHEN NEEDED TO CONTROLYOUR HEARTBURN.  FOLLOW UP IN 4 MOS.

## 2017-11-18 NOTE — Patient Instructions (Addendum)
DRINK WATER TO KEEP YOUR URINE LIGHT YELLOW.  AVOID REFLUX TRIGGERS. SEE INFO BELOW.   COMPLETE COLONOSCOPY AND UPPER ENDOSCOPY WITH PROPOFOL FOR SEDATION IN 2-3 WEEKS. YOU MAY BRING THE ENEMA TO ADMINISTER IN THE PREOP AREA. DO NOT DRINK BROTH WITH OIL/FAT DROPLETS IN IT ON THE DAY BEFORE YOUR COLONOSCOPY.  CONTINUE PROTONIX. TAKE 30 MINUTES PRIOR TO BREAKFAST.  USE ZANTAC WHEN NEEDED TO CONTROLYOUR HEARTBURN.  FOLLOW UP IN 4 MOS.    Lifestyle and home remedies TO HELP CONTROL HEARTBURN.  You may eliminate or reduce the frequency of heartburn by making the following lifestyle changes:  . Control your weight. Being overweight is a major risk factor for heartburn and GERD. Excess pounds put pressure on your abdomen, pushing up your stomach and causing acid to back up into your esophagus.   . Eat smaller meals. 4 TO 6 MEALS A DAY. This reduces pressure on the lower esophageal sphincter, helping to prevent the valve from opening and acid from washing back into your esophagus.   Dolphus Jenny your belt. Clothes that fit tightly around your waist put pressure on your abdomen and the lower esophageal sphincter.  .  . Eliminate heartburn triggers. Everyone has specific triggers.Common triggers such as fatty or fried foods, spicy food, tomato sauce, carbonated beverages, alcohol, chocolate, mint, garlic, onion, caffeine and nicotine may make heartburn worse.   Marland Kitchen Avoid stooping or bending. Tying your shoes is OK. Bending over for longer periods to weed your garden isn't, especially soon after eating.   . Don't lie down after a meal. Wait at least three to four hours after eating before going to bed, and don't lie down right after eating.   Marland Kitchen PLACE THE HEAD OF YOUR BED ON 6 INCH BLOCKS.  Alternative medicine . Several home remedies exist for treating GERD, but they provide only temporary relief. They include drinking baking soda (sodium bicarbonate) added to water or drinking other fluids such as  baking soda mixed with cream of tartar and water. . Although these liquids create temporary relief by neutralizing, washing away or buffering acids, eventually they aggravate the situation by adding gas and fluid to your stomach, increasing pressure and causing more acid reflux. Further, adding more sodium to your diet may increase your blood pressure and add stress to your heart, and excessive bicarbonate ingestion can alter the acid-base balance in your body.

## 2017-11-18 NOTE — H&P (View-Only) (Signed)
 Subjective:    Patient ID: [name], male    DOB: 04/14/1946, 72 y.o.   MRN: [id number]  Shah, Ashish, MD   HPI Pain in RUQ(SHARP/PRESSURE IN HIS CHEST, LASTS SHARP: 2-3 MINS OTHERWISE DULL ACHE) and radiating to right chest. PAIN BEEN THERE SINCE STENT PUT IN 2017. WORKUP INCLUDED CT/ U/S JAN 2019: A LITTLE FAT IN LIVER. NOT REALLY REALLY RELATED TO FOOD. IF EAT TOO MUCH MAKES IT WORSE. DOESN'T KEEP HIM AWAKE AND NOT WAKING HIM UP IN THE MIDDLE OF THE NIGHT.  SHARP STABBING PAIN HAPPENS: EVERY OTHER DAY. CAN FEELS LIKE IT TAKES HIS BREATH. IN PAST WEEK HASN'T HAD PAIN IN CHEST. FEELS LIKE HE HAS HEARTBURN ALL THE TIME WHICH DOES KEEP HIM AWAKE. NO PAIN WITH SWALLOWING. ON JAKAFI IN 2018 AND DOSE WAS REDUCED DUE TO NVD AND ABDOMINAL PAIN.  PT DENIES FEVER, CHILLS, HEMATOCHEZIA, HEMATEMESIS, nausea, vomiting, melena, diarrhea, CHANGE IN BOWEL IN HABITS, constipation, problems swallowing, OR problems with sedation.  Past Medical History:  Diagnosis Date  . Anxiety   . Arthritis   . BPH (benign prostatic hyperplasia)   . CAD (coronary artery disease)    DES to circumflex 07/2016, moderate residual LAD and RCA, small 90% OM3 - managed medically  . Chronic lower back pain   . Depression   . Essential hypertension   . Gastritis   . GERD (gastroesophageal reflux disease)   . History of kidney stones   . Leukemia (HCC)   . 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 Pneumonia X 1  . Renal insufficiency   . Spleen enlarged   . Type II diabetes mellitus (HCC)     Past Surgical History:  Procedure Laterality Date  . BACK SURGERY    . BONE MARROW BIOPSY     x 3 times  . CARDIAC CATHETERIZATION N/A 08/12/2016   Procedure: Left Heart Cath and Coronary Angiography;  Surgeon: Jayadeep S Varanasi, MD;  Location: [facility];  Service: Cardiovascular;  Laterality: N/A;  . CARDIAC CATHETERIZATION N/A 08/12/2016   Procedure: Coronary  Stent Intervention;  Surgeon: Jayadeep S Varanasi, MD;  Location: [facility];  Service: Cardiovascular;  Laterality: N/A;  . CARDIAC CATHETERIZATION  2000s X 1  . COLONOSCOPY WITH PROPOFOL N/A 02/01/2013   SLF: 1. 23 colon polyps removed. 2 retrieved. 2. Mild diverticulosis in teh sigmoid colon 3. Small internal hemorrhoids 4. The colon is redundant   . CYSTOSCOPY W/ STONE MANIPULATION    . ESOPHAGOGASTRODUODENOSCOPY (EGD) WITH PROPOFOL N/A 02/01/2013   SLF: 1. Moderate erosive gastritis  . LUMBAR DISC SURGERY  1978  . POLYPECTOMY N/A 02/01/2013   Procedure: POLYPECTOMY;  Surgeon: Sandi L Fields, MD;  Location: [facility];  Service: Endoscopy;  Laterality: N/A;   Allergies  Allergen Reactions  . Hydrocodone Itching   Current Outpatient Medications  Medication Sig Dispense Refill  . acetaminophen (TYLENOL) 500 MG tablet Take 1,000 mg by mouth 2 (two) times daily as needed for mild pain or moderate pain.     . amLODipine (NORVASC) 10 MG tablet Take 1 tablet (10 mg total) by mouth daily.    . cyclobenzaprine (FLEXERIL) 5 MG tablet Take 1 tablet by mouth daily.    . DULoxetine (CYMBALTA) 60 MG capsule Take 60 mg by mouth daily.     . HYDROcodone-acetaminophen (NORCO/VICODIN) 5-325 MG tablet Take 1 tablet by mouth 3 (three) times daily as needed.     .   nitroGLYCERIN (NITROSTAT) 0.4 MG SL tablet Place 0.4 mg under the tongue every 5 (five) minutes as needed for chest pain.    . ranitidine (ZANTAC) 150 MG tablet Take 300 mg by mouth daily.     . ruxolitinib phosphate (JAKAFI) 5 MG tablet Take 5-10 mg by mouth 2 (two) times daily. 5mg in the morning and 10mg at bedtime    . tiZANidine (ZANAFLEX) 4 MG tablet Take 1 tablet by mouth 2 (two) times daily.    . traZODone (DESYREL) 50 MG tablet Take 50 mg by mouth at bedtime as needed for sleep.    . allopurinol (ZYLOPRIM) 300 MG tablet Take 300 mg by mouth daily.    . hydrALAZINE (APRESOLINE) 25 MG tablet Take 1 tablet (25 mg total) by mouth 3 (three)  times daily. (Patient not taking: Reported on 11/18/2017) 90 tablet 0   Family History  Problem Relation Age of Onset  . Heart attack Mother   . CVA Mother   . Heart attack Father   . Colon cancer Neg Hx     Social History   Tobacco Use  . Smoking status: Former Smoker    Packs/day: 1.00    Years: 20.00    Pack years: 20.00    Types: Cigars    Start date: 06/25/1975    Last attempt to quit: 08/04/2013    Years since quitting: 4.2  . Smokeless tobacco: Never Used  Substance Use Topics  . 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 Drug use: No   Review of Systems PER HPI OTHERWISE ALL SYSTEMS ARE NEGATIVE.    Objective:   Physical Exam  Constitutional: He is oriented to person, place, and time. He appears well-developed and well-nourished. No distress.  HENT:  Head: Normocephalic and atraumatic.  Mouth/Throat: Oropharynx is clear and moist. No oropharyngeal exudate.  Eyes: Pupils are equal, round, and reactive to light. No scleral icterus.  Neck: Normal range of motion. Neck supple.  Cardiovascular: Normal rate, regular rhythm and normal heart sounds.  Pulmonary/Chest: Effort normal and breath sounds normal. No respiratory distress.  Abdominal: Soft. Bowel sounds are normal. He exhibits no distension. There is tenderness. There is no rebound and no guarding.  MILD TO MODERATE TTP X4  Musculoskeletal: He exhibits no edema.  Lymphadenopathy:    He has no cervical adenopathy.  Neurological: He is alert and oriented to person, place, and time.  NO  NEW FOCAL DEFICITS  Psychiatric:  FLAT AFFECT, NL MOOD  Vitals reviewed.      Assessment & Plan:   

## 2017-11-20 DIAGNOSIS — Z299 Encounter for prophylactic measures, unspecified: Secondary | ICD-10-CM | POA: Diagnosis not present

## 2017-11-20 DIAGNOSIS — Z7189 Other specified counseling: Secondary | ICD-10-CM | POA: Diagnosis not present

## 2017-11-20 DIAGNOSIS — Z0000 Encounter for general adult medical examination without abnormal findings: Secondary | ICD-10-CM | POA: Diagnosis not present

## 2017-11-20 DIAGNOSIS — Z79899 Other long term (current) drug therapy: Secondary | ICD-10-CM | POA: Diagnosis not present

## 2017-11-20 DIAGNOSIS — R5383 Other fatigue: Secondary | ICD-10-CM | POA: Diagnosis not present

## 2017-11-20 DIAGNOSIS — Z6833 Body mass index (BMI) 33.0-33.9, adult: Secondary | ICD-10-CM | POA: Diagnosis not present

## 2017-11-20 DIAGNOSIS — E1165 Type 2 diabetes mellitus with hyperglycemia: Secondary | ICD-10-CM | POA: Diagnosis not present

## 2017-11-20 DIAGNOSIS — E1122 Type 2 diabetes mellitus with diabetic chronic kidney disease: Secondary | ICD-10-CM | POA: Diagnosis not present

## 2017-11-20 DIAGNOSIS — N183 Chronic kidney disease, stage 3 (moderate): Secondary | ICD-10-CM | POA: Diagnosis not present

## 2017-11-20 DIAGNOSIS — Z1331 Encounter for screening for depression: Secondary | ICD-10-CM | POA: Diagnosis not present

## 2017-11-20 DIAGNOSIS — E7800 Pure hypercholesterolemia, unspecified: Secondary | ICD-10-CM | POA: Diagnosis not present

## 2017-11-20 DIAGNOSIS — Z1339 Encounter for screening examination for other mental health and behavioral disorders: Secondary | ICD-10-CM | POA: Diagnosis not present

## 2017-11-23 ENCOUNTER — Encounter (HOSPITAL_COMMUNITY): Payer: Self-pay | Admitting: Emergency Medicine

## 2017-11-23 ENCOUNTER — Emergency Department (HOSPITAL_COMMUNITY): Payer: Medicare Other

## 2017-11-23 ENCOUNTER — Emergency Department (HOSPITAL_COMMUNITY)
Admission: EM | Admit: 2017-11-23 | Discharge: 2017-11-23 | Disposition: A | Discharge status: Home / Self Care | Payer: Medicare Other | Attending: Emergency Medicine | Admitting: Emergency Medicine

## 2017-11-23 ENCOUNTER — Other Ambulatory Visit: Payer: Self-pay

## 2017-11-23 DIAGNOSIS — N183 Chronic kidney disease, stage 3 (moderate): Secondary | ICD-10-CM | POA: Insufficient documentation

## 2017-11-23 DIAGNOSIS — Z856 Personal history of leukemia: Secondary | ICD-10-CM | POA: Insufficient documentation

## 2017-11-23 DIAGNOSIS — E119 Type 2 diabetes mellitus without complications: Secondary | ICD-10-CM | POA: Diagnosis not present

## 2017-11-23 DIAGNOSIS — I2510 Atherosclerotic heart disease of native coronary artery without angina pectoris: Secondary | ICD-10-CM | POA: Diagnosis not present

## 2017-11-23 DIAGNOSIS — R109 Unspecified abdominal pain: Secondary | ICD-10-CM

## 2017-11-23 DIAGNOSIS — Z87891 Personal history of nicotine dependence: Secondary | ICD-10-CM | POA: Insufficient documentation

## 2017-11-23 DIAGNOSIS — R05 Cough: Secondary | ICD-10-CM | POA: Diagnosis not present

## 2017-11-23 DIAGNOSIS — R1011 Right upper quadrant pain: Secondary | ICD-10-CM | POA: Diagnosis not present

## 2017-11-23 DIAGNOSIS — Z79899 Other long term (current) drug therapy: Secondary | ICD-10-CM | POA: Insufficient documentation

## 2017-11-23 DIAGNOSIS — M549 Dorsalgia, unspecified: Secondary | ICD-10-CM | POA: Diagnosis not present

## 2017-11-23 DIAGNOSIS — I509 Heart failure, unspecified: Secondary | ICD-10-CM | POA: Insufficient documentation

## 2017-11-23 DIAGNOSIS — Z87442 Personal history of urinary calculi: Secondary | ICD-10-CM | POA: Insufficient documentation

## 2017-11-23 DIAGNOSIS — R0789 Other chest pain: Secondary | ICD-10-CM | POA: Insufficient documentation

## 2017-11-23 DIAGNOSIS — R1031 Right lower quadrant pain: Secondary | ICD-10-CM | POA: Diagnosis not present

## 2017-11-23 DIAGNOSIS — I130 Hypertensive heart and chronic kidney disease with heart failure and stage 1 through stage 4 chronic kidney disease, or unspecified chronic kidney disease: Secondary | ICD-10-CM | POA: Diagnosis not present

## 2017-11-23 DIAGNOSIS — R079 Chest pain, unspecified: Secondary | ICD-10-CM | POA: Diagnosis not present

## 2017-11-23 LAB — URINALYSIS, ROUTINE W REFLEX MICROSCOPIC
BILIRUBIN URINE: NEGATIVE
Bacteria, UA: NONE SEEN
GLUCOSE, UA: NEGATIVE mg/dL
Ketones, ur: NEGATIVE mg/dL
LEUKOCYTES UA: NEGATIVE
Nitrite: NEGATIVE
Protein, ur: 100 mg/dL — AB
SPECIFIC GRAVITY, URINE: 1.015 (ref 1.005–1.030)
pH: 5 (ref 5.0–8.0)

## 2017-11-23 LAB — COMPREHENSIVE METABOLIC PANEL
ALBUMIN: 3.6 g/dL (ref 3.5–5.0)
ALT: 29 U/L (ref 17–63)
AST: 33 U/L (ref 15–41)
Alkaline Phosphatase: 56 U/L (ref 38–126)
Anion gap: 9 (ref 5–15)
BUN: 23 mg/dL — AB (ref 6–20)
CHLORIDE: 105 mmol/L (ref 101–111)
CO2: 24 mmol/L (ref 22–32)
Calcium: 8.9 mg/dL (ref 8.9–10.3)
Creatinine, Ser: 1.69 mg/dL — ABNORMAL HIGH (ref 0.61–1.24)
GFR calc Af Amer: 45 mL/min — ABNORMAL LOW (ref >60)
GFR calc non Af Amer: 39 mL/min — ABNORMAL LOW (ref >60)
GLUCOSE: 166 mg/dL — AB (ref 65–99)
POTASSIUM: 4.4 mmol/L (ref 3.5–5.1)
SODIUM: 138 mmol/L (ref 135–145)
Total Bilirubin: 0.7 mg/dL (ref 0.3–1.2)
Total Protein: 6.4 g/dL — ABNORMAL LOW (ref 6.5–8.1)

## 2017-11-23 LAB — CBC WITH DIFFERENTIAL/PLATELET
Basophils Absolute: 0.4 10*3/uL (ref 0.0–0.1)
Basophils Relative: 7 %
Eosinophils Absolute: 0.1 10*3/uL (ref 0.0–0.7)
Eosinophils Relative: 2 %
HEMATOCRIT: 34.9 % — AB (ref 39.0–52.0)
Hemoglobin: 11.7 g/dL — ABNORMAL LOW (ref 13.0–17.0)
LYMPHS ABS: 1.5 10*3/uL (ref 0.7–4.0)
LYMPHS PCT: 30 %
MCH: 30.2 pg (ref 26.0–34.0)
MCHC: 33.5 g/dL (ref 30.0–36.0)
MCV: 89.9 fL (ref 78.0–100.0)
MONOS PCT: 21 %
Monocytes Absolute: 1.1 10*3/uL (ref 0.1–1.0)
NEUTROS PCT: 40 %
Neutro Abs: 2.1 10*3/uL (ref 1.7–7.7)
Platelets: 49 10*3/uL — ABNORMAL LOW (ref 150–400)
RBC: 3.88 MIL/uL — ABNORMAL LOW (ref 4.22–5.81)
RDW: 21.1 % — AB (ref 11.5–15.5)
WBC: 5.2 10*3/uL (ref 4.0–10.5)

## 2017-11-23 LAB — TROPONIN I: Troponin I: <0.03 ng/mL (ref <0.03)

## 2017-11-23 LAB — LIPASE, BLOOD: Lipase: 35 U/L (ref 11–51)

## 2017-11-23 MED ORDER — HYDROMORPHONE HCL 1 MG/ML IJ SOLN
1.0000 mg | Freq: Once | INTRAMUSCULAR | Status: AC
  Administered 2017-11-23: 1 mg via INTRAVENOUS
  Filled 2017-11-23: qty 1

## 2017-11-23 MED ORDER — IOPAMIDOL (ISOVUE-300) INJECTION 61%
100.0000 mL | Freq: Once | INTRAVENOUS | Status: AC | PRN
  Administered 2017-11-23: 100 mL via INTRAVENOUS

## 2017-11-23 MED ORDER — FENTANYL CITRATE (PF) 100 MCG/2ML IJ SOLN
50.0000 ug | Freq: Once | INTRAMUSCULAR | Status: AC
  Administered 2017-11-23: 50 ug via INTRAVENOUS
  Filled 2017-11-23: qty 2

## 2017-11-23 MED ORDER — OXYCODONE-ACETAMINOPHEN 5-325 MG PO TABS: 1.0000 | ORAL_TABLET | Freq: Four times a day (QID) | ORAL | 0 refills | Status: DC | PRN

## 2017-11-23 MED ORDER — SODIUM CHLORIDE 0.9 % IV BOLUS
1000.0000 mL | Freq: Once | INTRAVENOUS | Status: AC
  Administered 2017-11-23: 1000 mL via INTRAVENOUS

## 2017-11-23 MED ORDER — ONDANSETRON HCL 4 MG/2ML IJ SOLN
4.0000 mg | Freq: Once | INTRAMUSCULAR | Status: AC
  Administered 2017-11-23: 4 mg via INTRAVENOUS
  Filled 2017-11-23: qty 2

## 2017-11-23 MED ORDER — OXYCODONE-ACETAMINOPHEN 5-325 MG PO TABS
2.0000 | ORAL_TABLET | Freq: Once | ORAL | Status: AC
  Administered 2017-11-23: 2 via ORAL
  Filled 2017-11-23: qty 2

## 2017-11-23 NOTE — Discharge Instructions (Signed)
You were evaluated in the emergency department for right flank and abdominal pain that has been more severe over the past 3 weeks.  You had blood work that showed you were more anemic than usual and your platelets were lower.  Your CAT scan did not show an obvious cause of your pain.  You will need to contact your cancer doctor tomorrow for follow-up of this and continue the work-up with your GI doctor.  You were having itching when you were taking the hydrocodone so we are switching to oxycodone to see if that is any better.

## 2017-11-23 NOTE — ED Provider Notes (Signed)
[facility] EMERGENCY DEPARTMENT Provider Note   CSN: [id number] Arrival date & time: 11/23/17  1754     History   Chief Complaint Chief Complaint  Patient presents with  . Flank Pain    HPI [name] is a 72 y.o. male.  He presents complaining of right flank pain radiating to his right upper quadrant right lower quadrant and into his chest is been going on for a few weeks.  Its sharp in nature.  Constant.  Denies any associated fever nausea vomiting diarrhea or urinary symptoms.  Does not seem to change with eating.  There is been no new medications.  He has a prior history of leukemia and is on an oral agent for that.  He is also on a PPI and has a history of a cardiac stent.  The history is provided by the patient.  Flank Pain  This is a new problem. The current episode started more than 1 week ago. The problem occurs constantly. The problem has not changed since onset.Associated symptoms include chest pain and abdominal pain. Pertinent negatives include no headaches and no shortness of breath. The symptoms are aggravated by sneezing and coughing. Nothing relieves the symptoms. He has tried nothing for the symptoms. The treatment provided no relief.    Past Medical History:  Diagnosis Date  . Anxiety   . Arthritis   . BPH (benign prostatic hyperplasia)   . CAD (coronary artery disease)    DES to circumflex 07/2016, moderate residual LAD and RCA, small 90% OM3 - managed medically  . Chronic lower back pain   . Depression   . Essential hypertension   . Gastritis   . GERD (gastroesophageal reflux disease)   . History of kidney stones   . Leukemia ([place])   . 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 Pneumonia X 1  . Renal insufficiency   . Spleen enlarged   . Type II diabetes mellitus [facility])     Patient Active Problem List   Diagnosis Date Noted  . RUQ abdominal pain 11/18/2017  . Acute CHF (congestive heart failure)  ([place]) 09/15/2016  . Essential hypertension 08/14/2016  . Headache 08/14/2016  . Hyperlipidemia LDL goal <70 08/14/2016  . Abnormal nuclear stress test   . 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 Acute respiratory failure with hypoxia ([place]) 04/27/2016  . Hypoxia   . Hematoma 04/25/2016  . Mass of chest wall, left   . Abnormal partial thromboplastin time (PTT)   . 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 Coronary artery disease due to lipid rich plaque 04/15/2016  . Leukocytosis 04/15/2016  . Diabetes mellitus ([place]) 04/15/2016  . Chest pain 04/15/2016  . CKD (chronic kidney disease) stage 3, GFR 30-59 ml/min (HCC) 04/15/2016  . Left shoulder pain 04/15/2016  . History of adenomatous polyp of colon 01/21/2013  . GERD (gastroesophageal reflux disease) 01/21/2013    Past Surgical History:  Procedure Laterality Date  . BACK SURGERY    . BONE MARROW BIOPSY     x 3 times  . CARDIAC CATHETERIZATION N/A 08/12/2016   Procedure: Left Heart Cath and Coronary Angiography;  Surgeon: Jettie Booze, MD;  Location: [place] [facility];  Service: Cardiovascular;  Laterality: N/A;  . CARDIAC CATHETERIZATION N/A 08/12/2016   Procedure: Coronary Stent Intervention;  Surgeon: Jettie Booze, MD;  Location: [place] [facility];  Service: Cardiovascular;  Laterality: N/A;  . CARDIAC CATHETERIZATION  2000s X  1  . COLONOSCOPY WITH PROPOFOL N/A 02/01/2013   SLF: 1. 23 colon polyps removed. 2 retrieved. 2. Mild diverticulosis in teh sigmoid colon 3. Small internal hemorrhoids 4. The colon is redundant   . CYSTOSCOPY W/ STONE MANIPULATION    . ESOPHAGOGASTRODUODENOSCOPY (EGD) WITH PROPOFOL N/A 02/01/2013   SLF: 1. Moderate erosive gastritis  . [place]  . POLYPECTOMY N/A 02/01/2013   Procedure: POLYPECTOMY;  Surgeon: Danie Binder, MD;  Location: [facility];  Service: Endoscopy;  Laterality: N/A;        Home Medications    Prior to Admission medications   Medication Sig Start Date  End Date Taking? Authorizing Provider  acetaminophen (TYLENOL) 500 MG tablet Take 1,000 mg by mouth 2 (two) times daily as needed for mild pain or moderate pain.     [provider]  allopurinol (ZYLOPRIM) 300 MG tablet Take 300 mg by mouth daily.    [provider]  amLODipine (NORVASC) 10 MG tablet Take 1 tablet (10 mg total) by mouth daily. 09/17/17 12/16/17  Arnoldo Lenis, MD  cyclobenzaprine (FLEXERIL) 5 MG tablet Take 1 tablet by mouth daily. 08/08/15   [provider]  DULoxetine (CYMBALTA) 60 MG capsule Take 60 mg by mouth daily.  04/09/16   [provider]  hydrALAZINE (APRESOLINE) 25 MG tablet Take 1 tablet (25 mg total) by mouth 3 (three) times daily. Patient not taking: Reported on 11/18/2017 09/02/17   Noemi Chapel, MD  HYDROcodone-acetaminophen (NORCO/VICODIN) 5-325 MG tablet Take 1 tablet by mouth 3 (three) times daily as needed.  07/23/17   [provider]  Na Sulfate-K Sulfate-Mg Sulf (SUPREP BOWEL PREP KIT) 17.5-3.13-1.6 GM/177ML SOLN Take 1 kit by mouth as directed. 11/18/17   Fields, Marga Melnick, MD  nitroGLYCERIN (NITROSTAT) 0.4 MG SL tablet Place 0.4 mg under the tongue every 5 (five) minutes as needed for chest pain.    [provider]  pantoprazole (PROTONIX) 40 MG tablet 1 po 30 mins prior to first meal 11/18/17   Fields, Sandi L, MD  ranitidine (ZANTAC) 150 MG tablet Take 300 mg by mouth daily.  07/23/17   [provider]  ruxolitinib phosphate (JAKAFI) 5 MG tablet Take 5-10 mg by mouth 2 (two) times daily. '5mg'$  in the morning and '10mg'$  at bedtime    [provider]  tiZANidine (ZANAFLEX) 4 MG tablet Take 1 tablet by mouth 2 (two) times daily. 07/13/17   [provider]  traZODone (DESYREL) 50 MG tablet Take 50 mg by mouth at bedtime as needed for sleep. 07/18/16   [provider]    Family History Family History  Problem Relation Age of Onset  . Heart attack Mother   . CVA Mother   . Heart  attack Father   . Colon cancer Neg Hx     Social History Social History   Tobacco Use  . Smoking status: Former Smoker    Packs/day: 1.00    Years: 20.00    Pack years: 20.00    Types: Cigars    Start date: 06/25/1975    Last attempt to quit: 08/04/2013    Years since quitting: 4.3  . Smokeless tobacco: Never Used  Substance Use Topics  . 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 Drug use: No     Allergies   Hydrocodone   Review of Systems Review of Systems  Constitutional: Negative for  chills and fever.  HENT: Negative for ear pain and sore throat.   Eyes: Negative for pain and visual disturbance.  Respiratory: Negative for cough and shortness of breath.   Cardiovascular: Positive for chest pain. Negative for palpitations.  Gastrointestinal: Positive for abdominal pain. Negative for constipation, diarrhea, nausea and vomiting.  Genitourinary: Positive for flank pain. Negative for dysuria and hematuria.  Musculoskeletal: Positive for back pain. Negative for arthralgias.  Skin: Negative for color change and rash.  Neurological: Negative for seizures, syncope and headaches.  All other systems reviewed and are negative.    Physical Exam Updated Vital Signs BP (!) 191/83 (BP Location: Left Arm)   Pulse 73   Temp 98 F (36.7 C) (Oral)   Resp 15   Ht 6' (1.829 m)   Wt 98.4 kg (217 lb)   SpO2 97%   BMI 29.43 kg/m   Physical Exam  Constitutional: He is oriented to person, place, and time. He appears well-developed and well-nourished.  HENT:  Head: Normocephalic and atraumatic.  Eyes: Conjunctivae are normal.  Neck: Neck supple.  Cardiovascular: Normal rate and regular rhythm.  No murmur heard. Pulmonary/Chest: Effort normal and breath sounds normal. No respiratory distress.  Abdominal: Normal appearance. He exhibits no mass. There is tenderness in the right upper quadrant, right lower quadrant and epigastric area.  There is no rigidity and no guarding.  Musculoskeletal: He exhibits no edema, tenderness or deformity.  Neurological: He is alert and oriented to person, place, and time. He has normal strength. No sensory deficit. GCS eye subscore is 4. GCS verbal subscore is 5. GCS motor subscore is 6.  Skin: Skin is warm and dry.  Psychiatric: He has a normal mood and affect.  Nursing note and vitals reviewed.    ED Treatments / Results  Labs (all labs ordered are listed, but only abnormal results are displayed) Labs Reviewed  URINALYSIS, ROUTINE W REFLEX MICROSCOPIC - Abnormal; Notable for the following components:      Result Value   Hgb urine dipstick SMALL (*)    Protein, ur 100 (*)    All other components within normal limits  COMPREHENSIVE METABOLIC PANEL - Abnormal; Notable for the following components:   Glucose, Bld 166 (*)    BUN 23 (*)    Creatinine, Ser 1.69 (*)    Total Protein 6.4 (*)    GFR calc non Af Amer 39 (*)    GFR calc Af Amer 45 (*)    All other components within normal limits  CBC WITH DIFFERENTIAL/PLATELET - Abnormal; Notable for the following components:   RBC 3.88 (*)    Hemoglobin 11.7 (*)    HCT 34.9 (*)    RDW 21.1 (*)    Platelets 49 (*)    All other components within normal limits  LIPASE, BLOOD  TROPONIN I    EKG EKG Interpretation  Date/Time:  Monday Nov 23 2017 18:36:32 EDT Ventricular Rate:  67 PR Interval:    QRS Duration: 105 QT Interval:  395 QTC Calculation: 417 R Axis:   -2 Text Interpretation:  Sinus rhythm Abnormal R-wave progression, early transition Borderline repolarization abnormality similar pattern to prior 2/19 Confirmed by Aletta Edouard [phone]) on 11/23/2017 6:39:51 PM   Radiology Ct Abdomen Pelvis W Contrast  Result Date: 11/23/2017 CLINICAL DATA:  Bilateral flank pain EXAM: CT ABDOMEN AND PELVIS WITH CONTRAST TECHNIQUE: Multidetector CT imaging of the abdomen and pelvis was performed using the standard protocol following bolus  administration of intravenous contrast. CONTRAST:  150m ISOVUE-300 IOPAMIDOL (ISOVUE-300) INJECTION 61% COMPARISON:  08/08/2017 FINDINGS: Lower chest: Dependent atelectasis. Hepatobiliary: Mild diffuse hepatic steatosis. Gallbladder is decompressed. Pancreas: Unremarkable Spleen: Splenomegaly Adrenals/Urinary Tract: Kidneys and adrenal glands are within normal limits. Bladder is unremarkable. Stomach/Bowel: Sigmoid diverticulosis. Normal appendix. Small hiatal hernia is suspected. No evidence of small-bowel obstruction. Vascular/Lymphatic: Extensive calcifications of the aorta and iliac arteries. No evidence of retroperitoneal adenopathy. Reproductive: Prostate is unremarkable. Other: Bilateral inguinal hernia contains adipose tissue. Calcifications in the subcutaneous fat of the gluteal regions likely represents injection granulomata. Musculoskeletal: Nonspecific sclerotic change throughout the bony frame work is noted. IMPRESSION: Stable splenomegaly. Electronically Signed   By: AMarybelle KillingsM.D.   On: 11/23/2017 20:38    Procedures Procedures (including critical care time)  Medications Ordered in ED Medications  sodium chloride 0.9 % bolus 1,000 mL (has no administration in time range)  ondansetron (ZOFRAN) injection 4 mg (has no administration in time range)  fentaNYL (SUBLIMAZE) injection 50 mcg (has no administration in time range)     Initial Impression / Assessment and Plan / ED Course  I have reviewed the triage vital signs and the nursing notes.  Pertinent labs & imaging results that were available during my care of the patient were reviewed by me and considered in my medical decision making (see chart for details).  Clinical Course as of Nov 24 1636  Mon Nov 23, 2017  2113 No obvious etiology found to explain patient's pain.  His crit and platelets are lower than baseline but do not explain his symptoms.  His LFTs are normal and creatinine is baseline.  I will prescribe him some  oxycodone as he is getting itchy to his hydrocodone.   [MB]  2113 I emphasized the need for him to follow-up with his oncologist for evaluation as soon as possible and he has an ongoing GI work-up to further help identify the cause of his abdominal pain.   [MB]    Clinical Course User Index [MB] BHayden Rasmussen MD     Final Clinical Impressions(s) / ED Diagnoses   Final diagnoses:  Right flank pain    ED Discharge Orders    None       BHayden Rasmussen MD 11/24/17 [phone]

## 2017-11-23 NOTE — ED Triage Notes (Signed)
Pt c/o of bilateral flank with radiation around upper abdominal x 3 days. Denies fever or urinary symptoms.

## 2017-12-01 ENCOUNTER — Inpatient Hospital Stay (HOSPITAL_COMMUNITY): Admission: RE | Admit: 2017-12-01 | Payer: Medicare Other | Source: Ambulatory Visit

## 2017-12-01 DIAGNOSIS — D469 Myelodysplastic syndrome, unspecified: Secondary | ICD-10-CM | POA: Diagnosis not present

## 2017-12-01 DIAGNOSIS — Z87442 Personal history of urinary calculi: Secondary | ICD-10-CM | POA: Diagnosis not present

## 2017-12-01 DIAGNOSIS — Z79899 Other long term (current) drug therapy: Secondary | ICD-10-CM | POA: Diagnosis not present

## 2017-12-01 DIAGNOSIS — D696 Thrombocytopenia, unspecified: Secondary | ICD-10-CM | POA: Diagnosis not present

## 2017-12-01 DIAGNOSIS — I509 Heart failure, unspecified: Secondary | ICD-10-CM | POA: Diagnosis not present

## 2017-12-01 DIAGNOSIS — R161 Splenomegaly, not elsewhere classified: Secondary | ICD-10-CM | POA: Diagnosis not present

## 2017-12-01 DIAGNOSIS — F418 Other specified anxiety disorders: Secondary | ICD-10-CM | POA: Diagnosis not present

## 2017-12-01 DIAGNOSIS — Z885 Allergy status to narcotic agent status: Secondary | ICD-10-CM | POA: Diagnosis not present

## 2017-12-01 DIAGNOSIS — Z955 Presence of coronary angioplasty implant and graft: Secondary | ICD-10-CM | POA: Diagnosis not present

## 2017-12-01 DIAGNOSIS — I130 Hypertensive heart and chronic kidney disease with heart failure and stage 1 through stage 4 chronic kidney disease, or unspecified chronic kidney disease: Secondary | ICD-10-CM | POA: Diagnosis not present

## 2017-12-01 DIAGNOSIS — K760 Fatty (change of) liver, not elsewhere classified: Secondary | ICD-10-CM | POA: Diagnosis not present

## 2017-12-01 NOTE — Patient Instructions (Signed)
[name]  12/01/2017     @PREFPERIOPPHARMACY @   Your procedure is scheduled on 12/08/2017.  Report to [facility] at 12:30 P.M.  Call this number if you have problems the morning of surgery:  [phone]   Remember:  Do not eat food or drink liquids after midnight.  Take these medicines the morning of surgery with A SIP OF WATER : Allopurinol, Norvasc, Cymbalta, Ativan, Percocet and Protonix   Do not wear jewelry, make-up or nail polish.  Do not wear lotions, powders, or perfumes, or deodorant.  Do not shave 48 hours prior to surgery.  Men may shave face and neck.  Do not bring valuables to the hospital.  [facility] is not responsible for any belongings or valuables.  Contacts, dentures or bridgework may not be worn into surgery.  Leave your suitcase in the car.  After surgery it may be brought to your room.  For patients admitted to the hospital, discharge time will be determined by your treatment team.  Patients discharged the day of surgery will not be allowed to drive home.   Name and phone number of your driver:   family Special instructions:  n/a  Please read over the following fact sheets that you were given. Care and Recovery After Surgery  Esophagogastroduodenoscopy Esophagogastroduodenoscopy (EGD) is a procedure to examine the lining of the esophagus, stomach, and first part of the small intestine (duodenum). This procedure is done to check for problems such as inflammation, bleeding, ulcers, or growths. During this procedure, a long, flexible, lighted tube with a camera attached (endoscope) is inserted down the throat. Tell a health care provider about:  Any allergies you have.  All medicines you are taking, including vitamins, herbs, eye drops, creams, and over-the-counter medicines.  Any problems you or family members have had with anesthetic medicines.  Any blood disorders you have.  Any surgeries you have had.  Any medical conditions you  have.  Whether you are pregnant or may be pregnant. What are the risks? Generally, this is a safe procedure. However, problems may occur, including:  Infection.  Bleeding.  A tear (perforation) in the esophagus, stomach, or duodenum.  Trouble breathing.  Excessive sweating.  Spasms of the larynx.  A slowed heartbeat.  Low blood pressure.  What happens before the procedure?  Follow instructions from your health care provider about eating or drinking restrictions.  Ask your health care provider about: ? Changing or stopping your regular medicines. This is especially important if you are taking diabetes medicines or blood thinners. ? Taking medicines such as aspirin and ibuprofen. These medicines can thin your blood. Do not take these medicines before your procedure if your health care provider instructs you not to.  Plan to have someone take you home after the procedure.  If you wear dentures, be ready to remove them before the procedure. What happens during the procedure?  To reduce your risk of infection, your health care team will wash or sanitize their hands.  An IV tube will be put in a vein in your hand or arm. You will get medicines and fluids through this tube.  You will be given one or more of the following: ? A medicine to help you relax (sedative). ? A medicine to numb the area (local anesthetic). This medicine may be sprayed into your throat. It will make you feel more comfortable and keep you from gagging or coughing during the procedure. ? A medicine for pain.  A mouth  guard may be placed in your mouth to protect your teeth and to keep you from biting on the endoscope.  You will be asked to lie on your left side.  The endoscope will be lowered down your throat into your esophagus, stomach, and duodenum.  Air will be put into the endoscope. This will help your health care provider see better.  The lining of your esophagus, stomach, and duodenum will be  examined.  Your health care provider may: ? Take a tissue sample so it can be looked at in a lab (biopsy). ? Remove growths. ? Remove objects (foreign bodies) that are stuck. ? Treat any bleeding with medicines or other devices that stop tissue from bleeding. ? Widen (dilate) or stretch narrowed areas of your esophagus and stomach.  The endoscope will be taken out. The procedure may vary among health care providers and hospitals. What happens after the procedure?  Your blood pressure, heart rate, breathing rate, and blood oxygen level will be monitored often until the medicines you were given have worn off.  Do not eat or drink anything until the numbing medicine has worn off and your gag reflex has returned. This information is not intended to replace advice given to you by your health care provider. Make sure you discuss any questions you have with your health care provider. Document Released: 11/07/2004 Document Revised: 12/13/2015 Document Reviewed: 05/31/2015 Elsevier Interactive Patient Education  2018 [employer].  Colonoscopy, Adult A colonoscopy is an exam to look at the entire large intestine. During the exam, a lubricated, bendable tube is inserted into the anus and then passed into the rectum, colon, and other parts of the large intestine. A colonoscopy is often done as a part of normal colorectal screening or in response to certain symptoms, such as anemia, persistent diarrhea, abdominal pain, and blood in the stool. The exam can help screen for and diagnose medical problems, including:  Tumors.  Polyps.  Inflammation.  Areas of bleeding.  Tell a health care provider about:  Any allergies you have.  All medicines you are taking, including vitamins, herbs, eye drops, creams, and over-the-counter medicines.  Any problems you or family members have had with anesthetic medicines.  Any blood disorders you have.  Any surgeries you have had.  Any medical conditions  you have.  Any problems you have had passing stool. What are the risks? Generally, this is a safe procedure. However, problems may occur, including:  Bleeding.  A tear in the intestine.  A reaction to medicines given during the exam.  Infection (rare).  What happens before the procedure? Eating and drinking restrictions Follow instructions from your health care provider about eating and drinking, which may include:  A few days before the procedure - follow a low-fiber diet. Avoid nuts, seeds, dried fruit, raw fruits, and vegetables.  1-3 days before the procedure - follow a clear liquid diet. Drink only clear liquids, such as clear broth or bouillon, black coffee or tea, clear juice, clear soft drinks or sports drinks, gelatin dessert, and popsicles. Avoid any liquids that contain red or purple dye.  On the day of the procedure - do not eat or drink anything during the 2 hours before the procedure, or within the time period that your health care provider recommends.  Bowel prep If you were prescribed an oral bowel prep to clean out your colon:  Take it as told by your health care provider. Starting the day before your procedure, you will need to  drink a large amount of medicated liquid. The liquid will cause you to have multiple loose stools until your stool is almost clear or light green.  If your skin or anus gets irritated from diarrhea, you may use these to relieve the irritation: ? Medicated wipes, such as adult wet wipes with aloe and vitamin E. ? A skin soothing-product like petroleum jelly.  If you vomit while drinking the bowel prep, take a break for up to 60 minutes and then begin the bowel prep again. If vomiting continues and you cannot take the bowel prep without vomiting, call your health care provider.  General instructions  Ask your health care provider about changing or stopping your regular medicines. This is especially important if you are taking diabetes  medicines or blood thinners.  Plan to have someone take you home from the hospital or clinic. What happens during the procedure?  An IV tube may be inserted into one of your veins.  You will be given medicine to help you relax (sedative).  To reduce your risk of infection: ? Your health care team will wash or sanitize their hands. ? Your anal area will be washed with soap.  You will be asked to lie on your side with your knees bent.  Your health care provider will lubricate a long, thin, flexible tube. The tube will have a camera and a light on the end.  The tube will be inserted into your anus.  The tube will be gently eased through your rectum and colon.  Air will be delivered into your colon to keep it open. You may feel some pressure or cramping.  The camera will be used to take images during the procedure.  A small tissue sample may be removed from your body to be examined under a microscope (biopsy). If any potential problems are found, the tissue will be sent to a lab for testing.  If small polyps are found, your health care provider may remove them and have them checked for cancer cells.  The tube that was inserted into your anus will be slowly removed. The procedure may vary among health care providers and hospitals. What happens after the procedure?  Your blood pressure, heart rate, breathing rate, and blood oxygen level will be monitored until the medicines you were given have worn off.  Do not drive for 24 hours after the exam.  You may have a small amount of blood in your stool.  You may pass gas and have mild abdominal cramping or bloating due to the air that was used to inflate your colon during the exam.  It is up to you to get the results of your procedure. Ask your health care provider, or the department performing the procedure, when your results will be ready. This information is not intended to replace advice given to you by your health care provider.  Make sure you discuss any questions you have with your health care provider. Document Released: 07/04/2000 Document Revised: 05/07/2016 Document Reviewed: 09/18/2015 Elsevier Interactive Patient Education  2018 [employer].

## 2017-12-03 ENCOUNTER — Encounter (HOSPITAL_COMMUNITY): Payer: Self-pay

## 2017-12-03 ENCOUNTER — Encounter (HOSPITAL_COMMUNITY)
Admission: RE | Admit: 2017-12-03 | Discharge: 2017-12-03 | Disposition: A | Discharge status: Home / Self Care | Payer: Medicare Other | Source: Ambulatory Visit | Attending: Gastroenterology | Admitting: Gastroenterology

## 2017-12-03 DIAGNOSIS — Z01812 Encounter for preprocedural laboratory examination: Secondary | ICD-10-CM | POA: Insufficient documentation

## 2017-12-03 LAB — BASIC METABOLIC PANEL
ANION GAP: 7 (ref 5–15)
BUN: 24 mg/dL — ABNORMAL HIGH (ref 6–20)
CHLORIDE: 105 mmol/L (ref 101–111)
CO2: 24 mmol/L (ref 22–32)
Calcium: 9 mg/dL (ref 8.9–10.3)
Creatinine, Ser: 1.84 mg/dL — ABNORMAL HIGH (ref 0.61–1.24)
GFR calc non Af Amer: 35 mL/min — ABNORMAL LOW (ref >60)
GFR, EST AFRICAN AMERICAN: 41 mL/min — AB (ref >60)
Glucose, Bld: 164 mg/dL — ABNORMAL HIGH (ref 65–99)
Potassium: 4.7 mmol/L (ref 3.5–5.1)
Sodium: 136 mmol/L (ref 135–145)

## 2017-12-03 LAB — CBC
HCT: 36.3 % — ABNORMAL LOW (ref 39.0–52.0)
HEMOGLOBIN: 12.5 g/dL — AB (ref 13.0–17.0)
MCH: 30.3 pg (ref 26.0–34.0)
MCHC: 34.4 g/dL (ref 30.0–36.0)
MCV: 88.1 fL (ref 78.0–100.0)
Platelets: 56 10*3/uL — ABNORMAL LOW (ref 150–400)
RBC: 4.12 MIL/uL — ABNORMAL LOW (ref 4.22–5.81)
RDW: 20.7 % — AB (ref 11.5–15.5)
WBC: 6.8 10*3/uL (ref 4.0–10.5)

## 2017-12-03 IMAGING — DX DG CHEST 2V
2 series · 2 of 2 positions shown · non-contrast
Comparison: Portable chest x-ray dated March 31, 2016.

CLINICAL DATA: Mid and left-sided chest pain began at 3 a.m. today.
Unable to raise the left arm due to pain.

EXAM:
CHEST  2 VIEW

[chest pa]
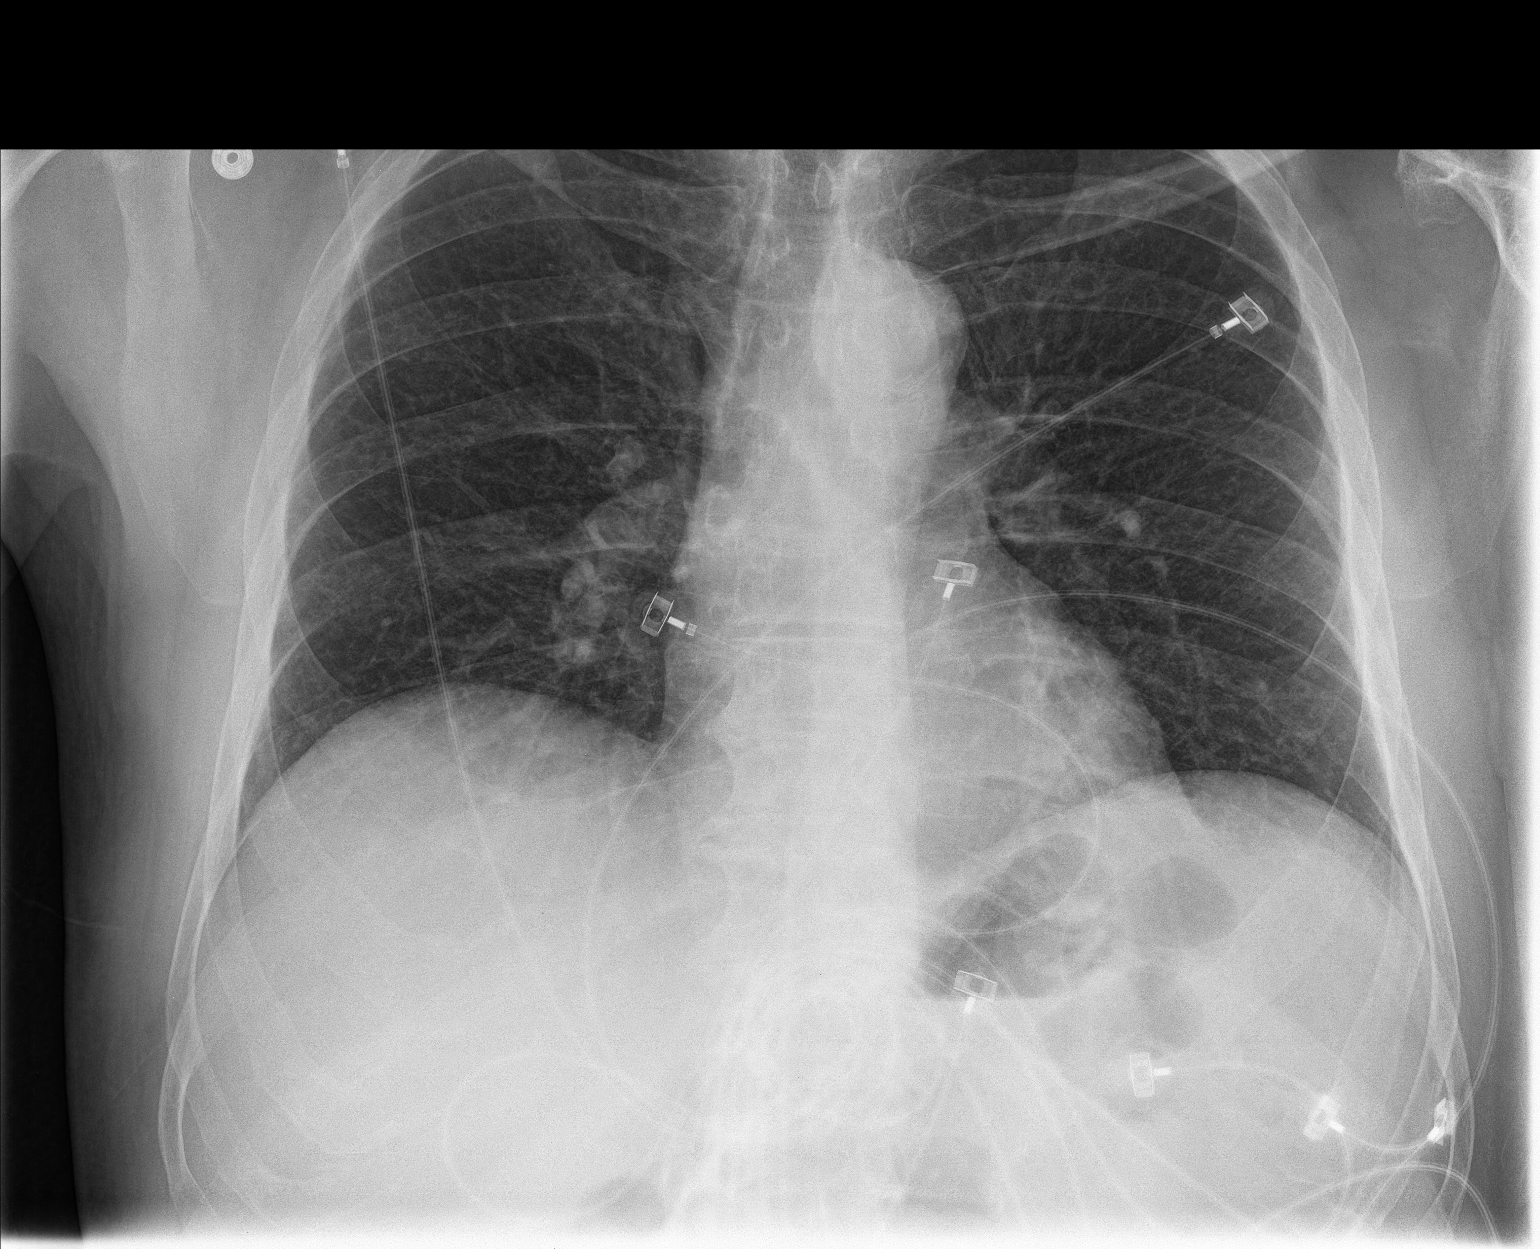

[chest lat]
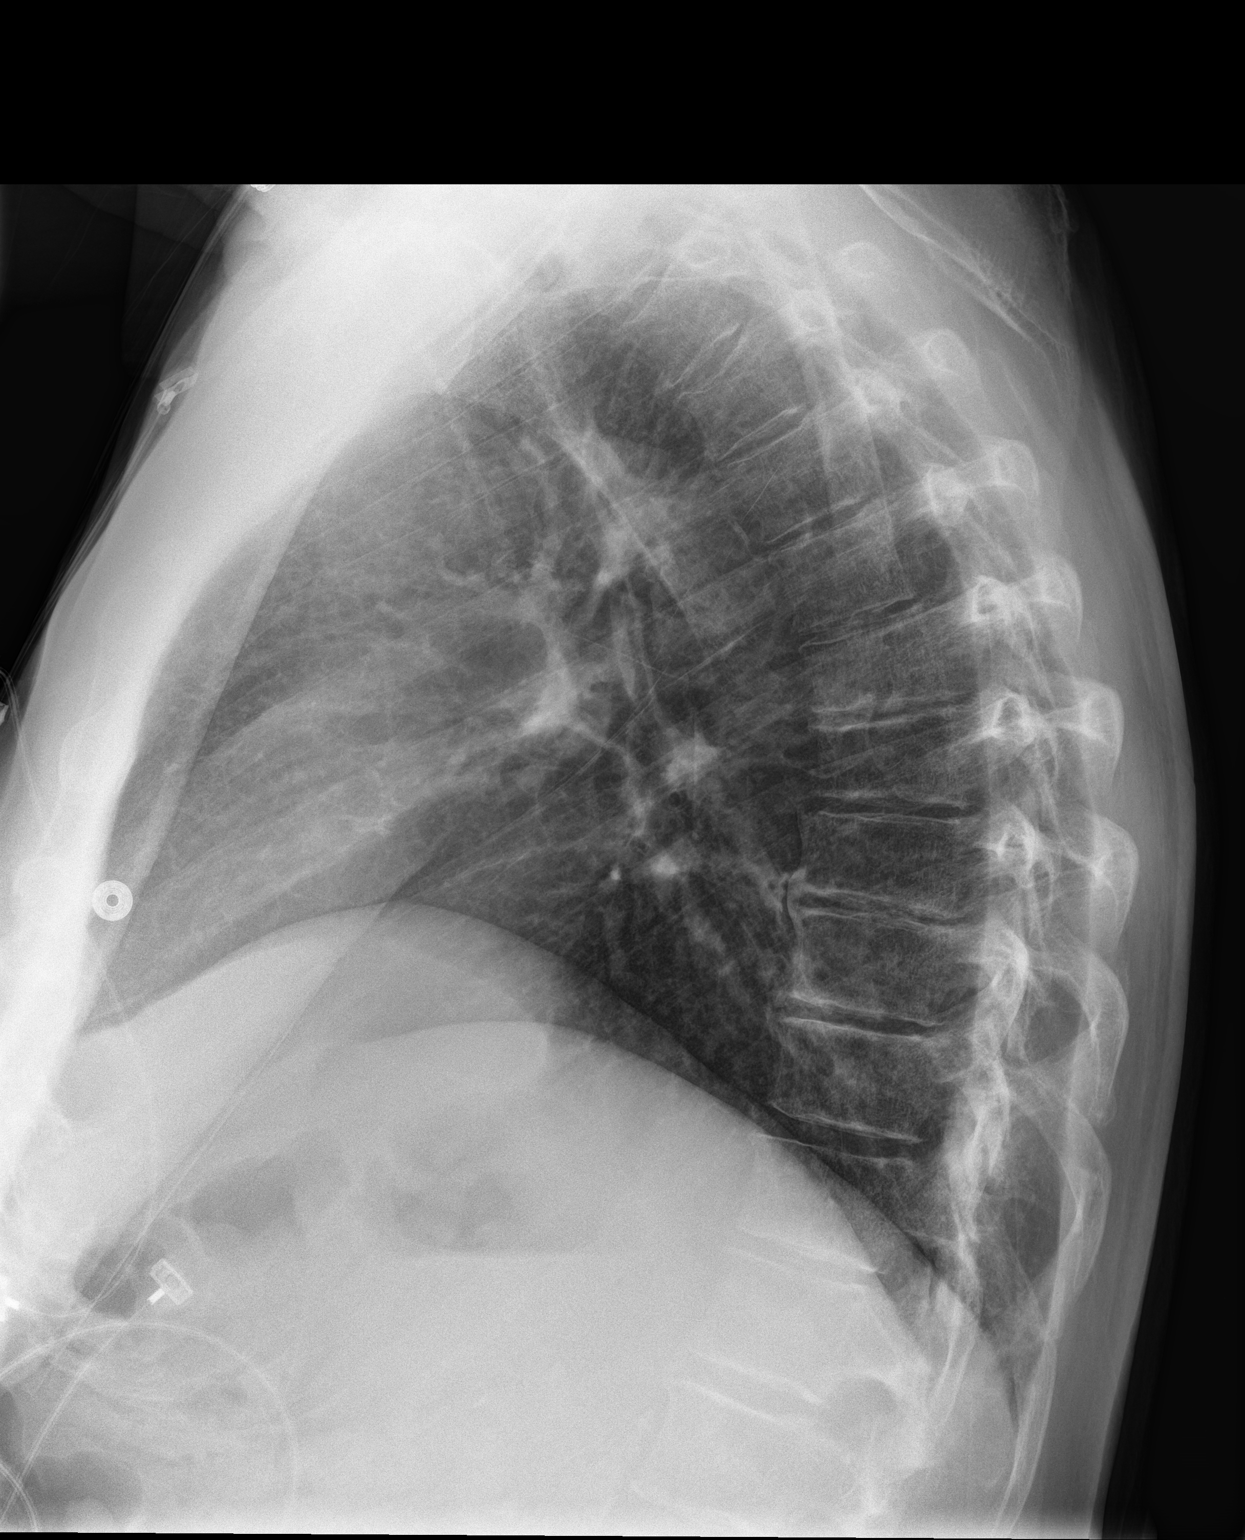

[2 of 2 positions shown; findings below may reference images not displayed]

FINDINGS: The lungs are mildly hypoinflated but clear. There is no
pneumothorax or pleural effusion. The heart and pulmonary
vascularity are normal. The mediastinum is normal in width. There is
calcification in the wall of the thoracic aorta. The bony thorax
exhibits no acute abnormality.
IMPRESSION: Mild hypoinflation.  No acute cardiopulmonary abnormality.

Aortic atherosclerosis.

## 2017-12-03 IMAGING — MR MR SHOULDER*L* W/O CM
4 of 5 series · 19 of 40 positions shown · non-contrast
Comparison: None.

CLINICAL DATA: Left shoulder pain without a injury.

EXAM:
MRI OF THE LEFT SHOULDER WITHOUT CONTRAST
TECHNIQUE: Multiplanar, multisequence MR imaging of the shoulder was performed.
No intravenous contrast was administered.

[Series 3: t2fs axial · axial · 3.0mm · 0.24mm/px · z∈[-30,+26]mm · 3 of 24 slices shown]
[im 4/24]
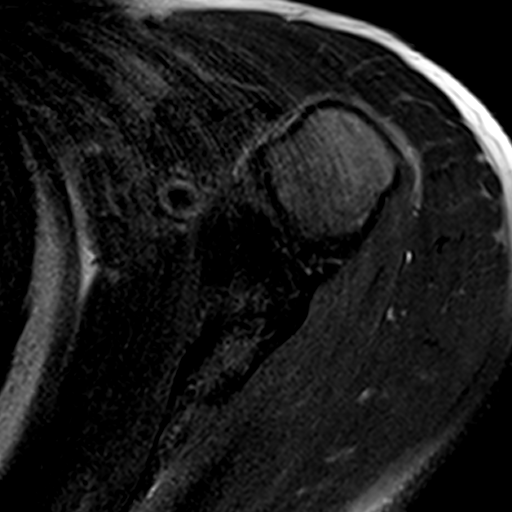
[im 14/24]
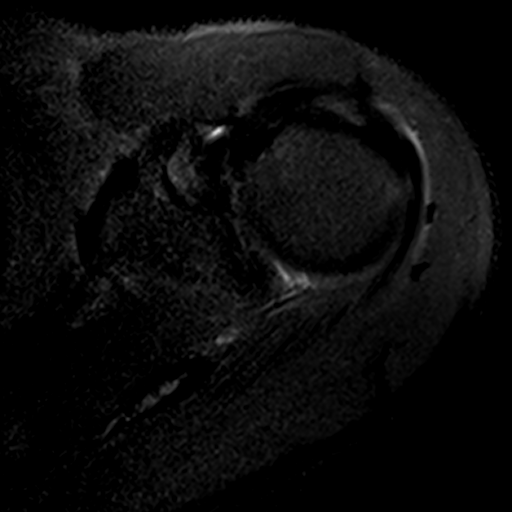
[im 20/24]
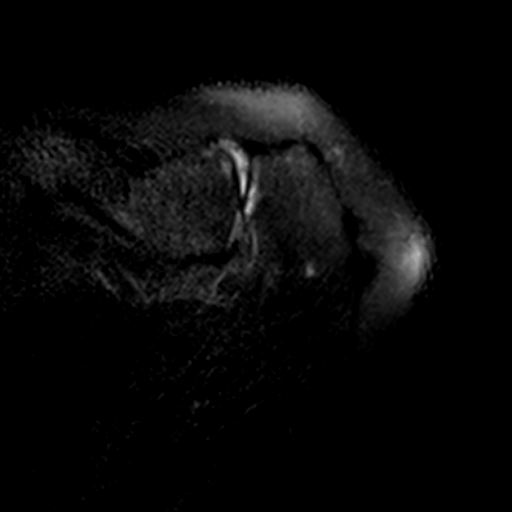

[Series 4: t2fs coronal · oblique · 3.0mm · 0.24mm/px · 3 of 20 slices shown]
[im 4/20]
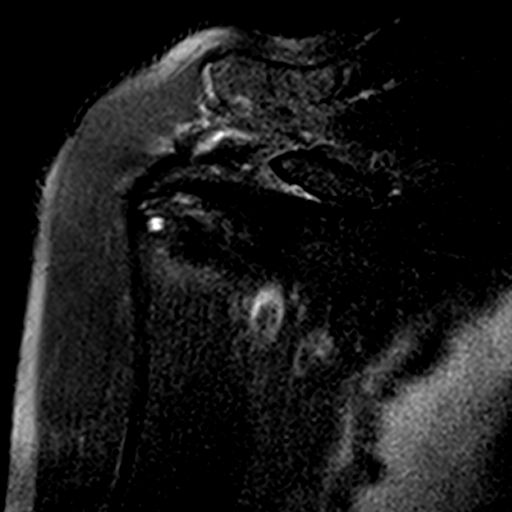
[im 10/20]
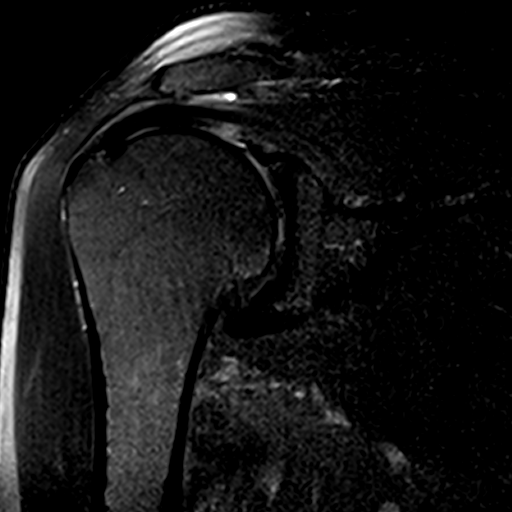
[im 16/20]
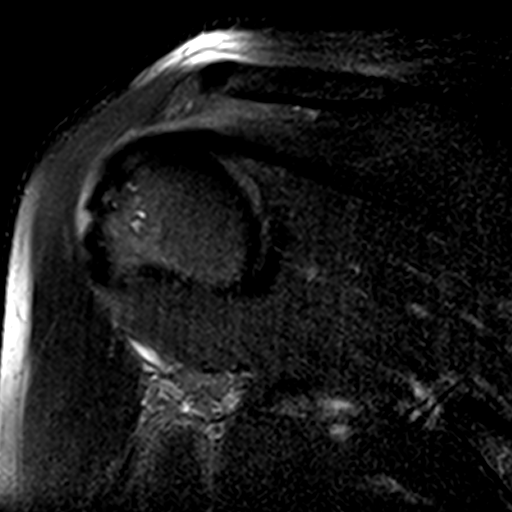

[Series 5: PD · oblique · 3.0mm · 0.24mm/px · 7 of 20 slices shown]
[im 1/20]
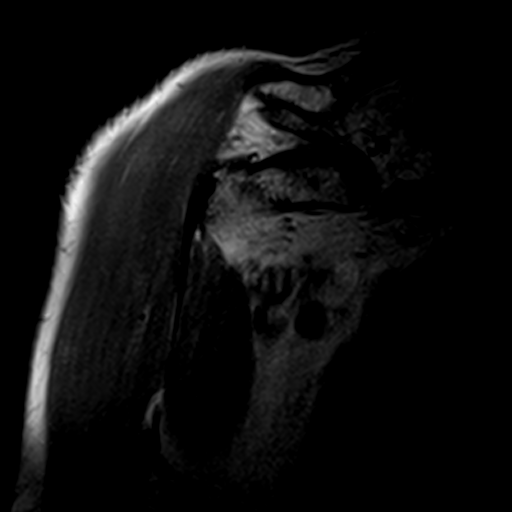
[im 4/20]
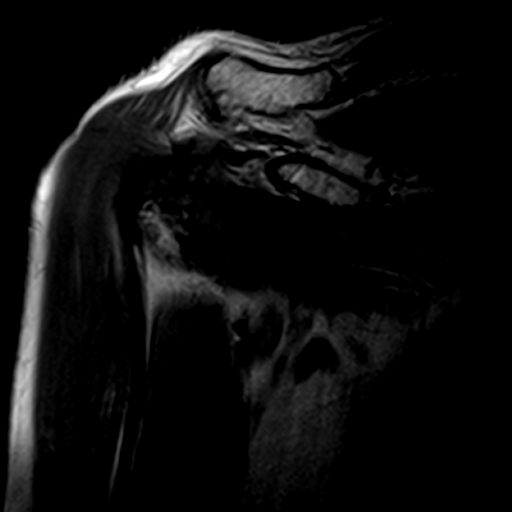
[im 7/20]
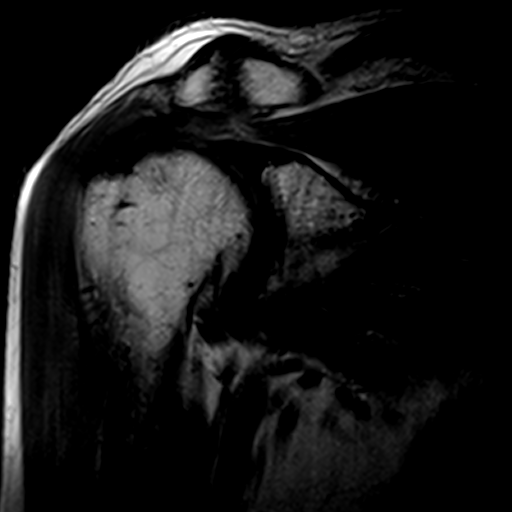
[im 10/20]
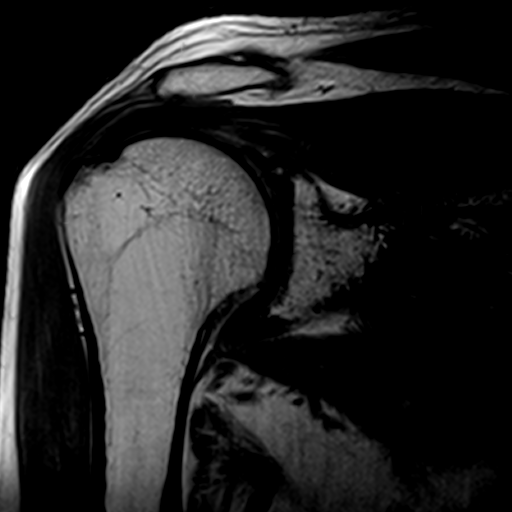
[im 13/20]
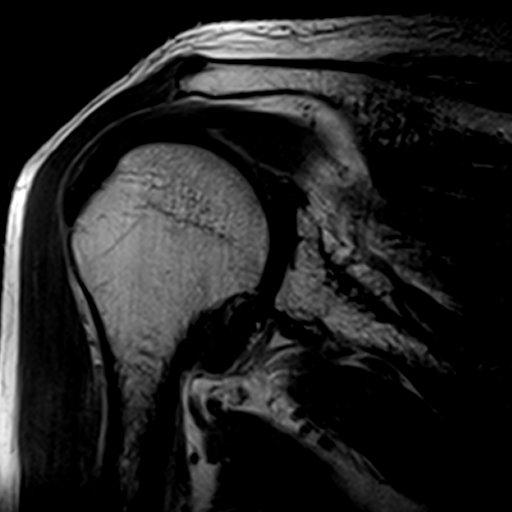
[im 16/20]
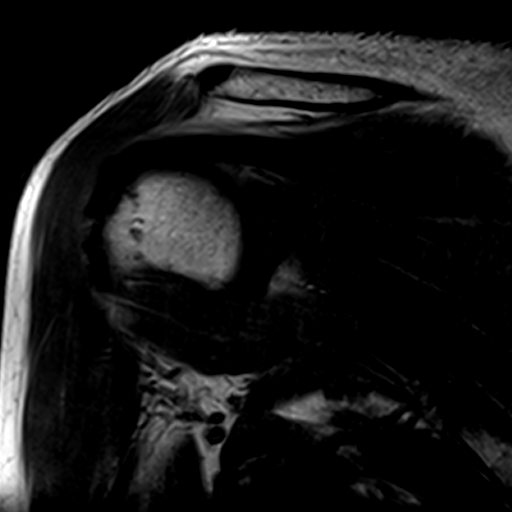
[im 20/20]
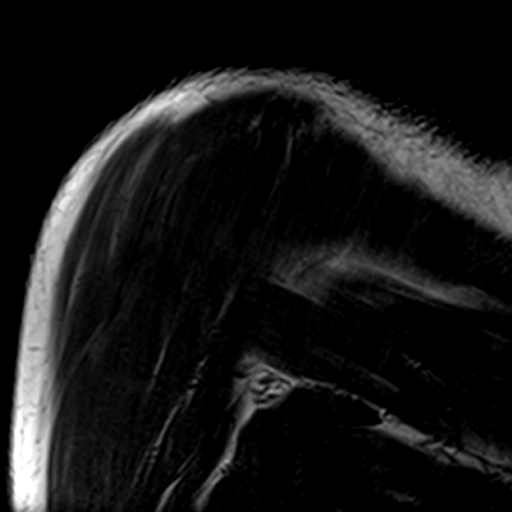

[Series 6: T1 · oblique · 3.0mm · 0.23mm/px · 6 of 24 slices shown]
[im 1/24]
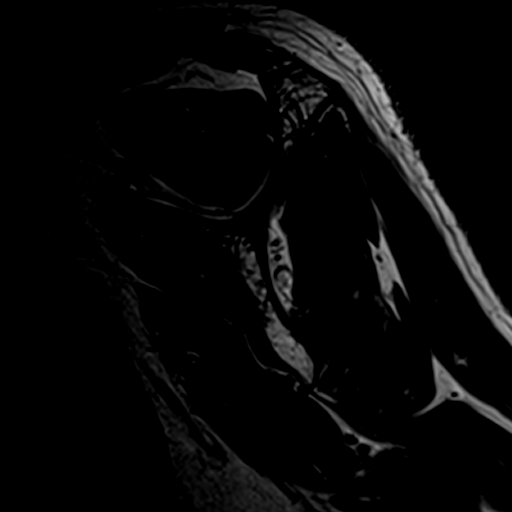
[im 3/24]
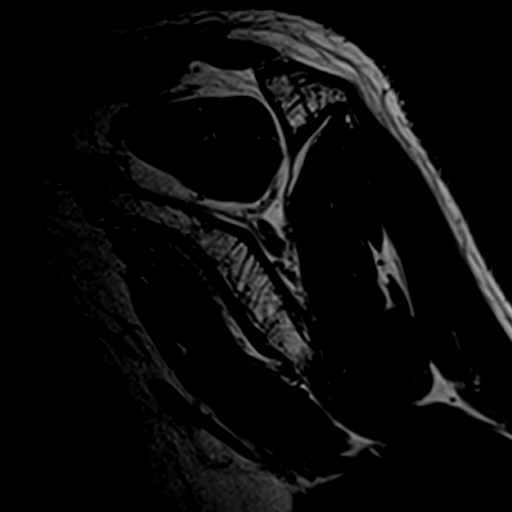
[im 6/24]
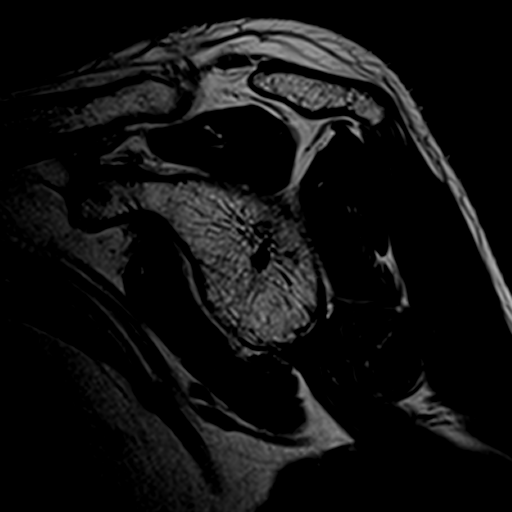
[im 9/24]
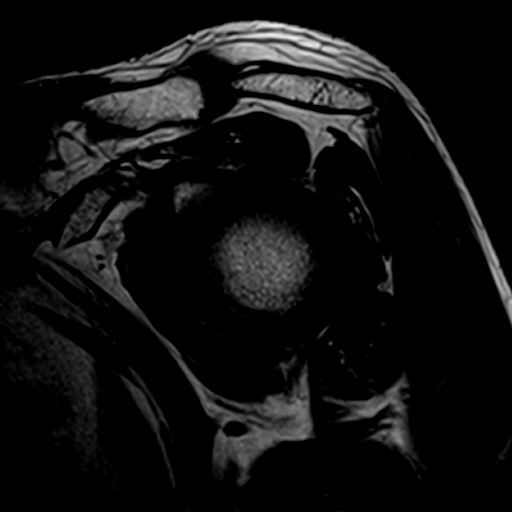
[im 12/24]
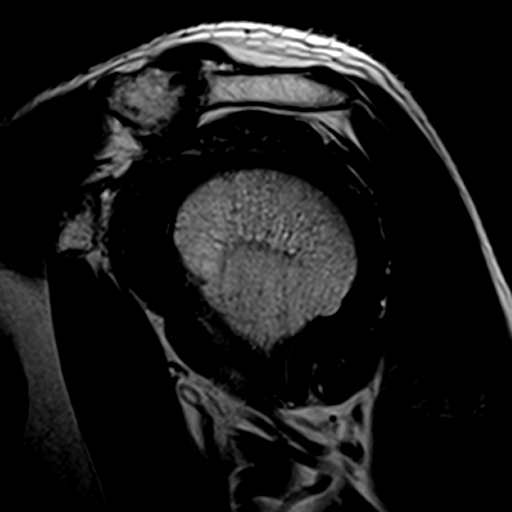
[im 21/24]
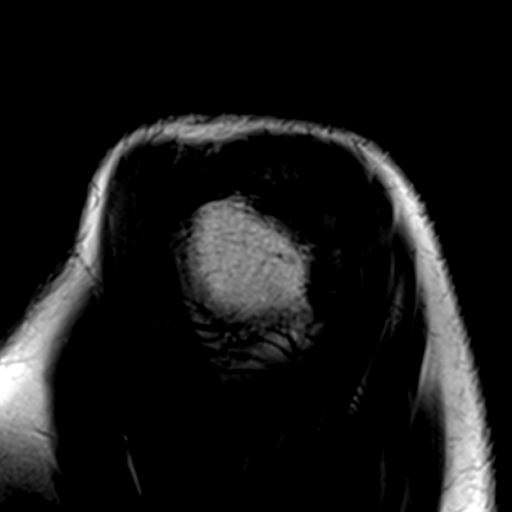

[19 of 40 positions shown; findings below may reference images not displayed]

FINDINGS: Rotator cuff: Mild tendinosis of the supraspinatus tendon with a
small insertional interstitial tear. Infraspinatus tendon is intact.
Teres minor tendon is intact. Subscapularis tendon is intact.

Muscles: No atrophy or fatty replacement of nor abnormal signal
within, the muscles of the rotator cuff.

Biceps long head:  Intact.

Acromioclavicular Joint: Mild arthropathy of the acromioclavicular
joint. Type I acromion. Trace amount of subacromial/ subdeltoid
bursal fluid.

Glenohumeral Joint: No joint effusion.  Mild chondromalacia.

Labrum: Limited evaluation secondary lack of intra-articular
contrast. Small posterior labral tear at the labroligamentous
junction.

Bones: Mild subcortical reactive marrow changes in the lesser
tuberosity. No other marrow signal abnormality. No fracture or
dislocation.

Other: No fluid collection or hematoma.
IMPRESSION: 1. Mild tendinosis of the supraspinatus tendon with a small
insertional interstitial tear.
2. Mild subacromial/subdeltoid bursitis.

## 2017-12-07 ENCOUNTER — Telehealth: Payer: Self-pay | Admitting: *Deleted

## 2017-12-07 NOTE — Telephone Encounter (Signed)
Spoke with patient to see if he can arrive at 8:15am tomorrow for procedure. Patient thought procedure was scheduled for today and he has already drank all of his prep. I advised his instructions state 12/08/17 Tuesday.   Spoke with CM, have patient continue clear liquids until after procedure tomorrow.   Patient was agreeable. Procedure moved up and carolyn is aware.

## 2017-12-08 ENCOUNTER — Encounter (HOSPITAL_COMMUNITY)
Admission: RE | Disposition: A | Discharge status: Home / Self Care | Payer: Self-pay | Source: Ambulatory Visit | Attending: Gastroenterology

## 2017-12-08 ENCOUNTER — Ambulatory Visit (HOSPITAL_COMMUNITY)
Admission: RE | Admit: 2017-12-08 | Discharge: 2017-12-08 | Disposition: A | Discharge status: Home / Self Care | Payer: Medicare Other | Source: Ambulatory Visit | Attending: Gastroenterology | Admitting: Gastroenterology

## 2017-12-08 ENCOUNTER — Encounter (HOSPITAL_COMMUNITY): Payer: Self-pay | Admitting: *Deleted

## 2017-12-08 ENCOUNTER — Ambulatory Visit (HOSPITAL_COMMUNITY): Payer: Medicare Other | Admitting: Anesthesiology

## 2017-12-08 DIAGNOSIS — R1013 Epigastric pain: Secondary | ICD-10-CM | POA: Diagnosis not present

## 2017-12-08 DIAGNOSIS — Z79899 Other long term (current) drug therapy: Secondary | ICD-10-CM | POA: Insufficient documentation

## 2017-12-08 DIAGNOSIS — F329 Major depressive disorder, single episode, unspecified: Secondary | ICD-10-CM | POA: Diagnosis not present

## 2017-12-08 DIAGNOSIS — K449 Diaphragmatic hernia without obstruction or gangrene: Secondary | ICD-10-CM | POA: Diagnosis not present

## 2017-12-08 DIAGNOSIS — N400 Enlarged prostate without lower urinary tract symptoms: Secondary | ICD-10-CM | POA: Diagnosis not present

## 2017-12-08 DIAGNOSIS — F419 Anxiety disorder, unspecified: Secondary | ICD-10-CM | POA: Insufficient documentation

## 2017-12-08 DIAGNOSIS — K219 Gastro-esophageal reflux disease without esophagitis: Secondary | ICD-10-CM | POA: Diagnosis not present

## 2017-12-08 DIAGNOSIS — E119 Type 2 diabetes mellitus without complications: Secondary | ICD-10-CM | POA: Diagnosis not present

## 2017-12-08 DIAGNOSIS — R1011 Right upper quadrant pain: Secondary | ICD-10-CM

## 2017-12-08 DIAGNOSIS — K644 Residual hemorrhoidal skin tags: Secondary | ICD-10-CM | POA: Diagnosis not present

## 2017-12-08 DIAGNOSIS — K2970 Gastritis, unspecified, without bleeding: Secondary | ICD-10-CM | POA: Diagnosis not present

## 2017-12-08 DIAGNOSIS — Z885 Allergy status to narcotic agent status: Secondary | ICD-10-CM | POA: Insufficient documentation

## 2017-12-08 DIAGNOSIS — Z87442 Personal history of urinary calculi: Secondary | ICD-10-CM | POA: Insufficient documentation

## 2017-12-08 DIAGNOSIS — I2510 Atherosclerotic heart disease of native coronary artery without angina pectoris: Secondary | ICD-10-CM | POA: Insufficient documentation

## 2017-12-08 DIAGNOSIS — Z87891 Personal history of nicotine dependence: Secondary | ICD-10-CM | POA: Diagnosis not present

## 2017-12-08 DIAGNOSIS — Z86010 Personal history of colonic polyps: Secondary | ICD-10-CM | POA: Diagnosis not present

## 2017-12-08 DIAGNOSIS — K648 Other hemorrhoids: Secondary | ICD-10-CM | POA: Insufficient documentation

## 2017-12-08 DIAGNOSIS — Q438 Other specified congenital malformations of intestine: Secondary | ICD-10-CM | POA: Diagnosis not present

## 2017-12-08 DIAGNOSIS — Z955 Presence of coronary angioplasty implant and graft: Secondary | ICD-10-CM | POA: Diagnosis not present

## 2017-12-08 DIAGNOSIS — I10 Essential (primary) hypertension: Secondary | ICD-10-CM | POA: Insufficient documentation

## 2017-12-08 HISTORY — PX: ESOPHAGOGASTRODUODENOSCOPY (EGD) WITH PROPOFOL: SHX5813

## 2017-12-08 HISTORY — PX: BIOPSY: SHX5522

## 2017-12-08 HISTORY — PX: COLONOSCOPY WITH PROPOFOL: SHX5780

## 2017-12-08 LAB — GLUCOSE, CAPILLARY: Glucose-Capillary: 141 mg/dL — ABNORMAL HIGH (ref 65–99)

## 2017-12-08 SURGERY — COLONOSCOPY WITH PROPOFOL
Anesthesia: Monitor Anesthesia Care

## 2017-12-08 MED ORDER — HYDROMORPHONE HCL 1 MG/ML IJ SOLN: 0.2500 mg | INTRAMUSCULAR | Status: DC | PRN

## 2017-12-08 MED ORDER — LACTATED RINGERS IV SOLN: INTRAVENOUS | Status: DC

## 2017-12-08 MED ORDER — PROPOFOL 10 MG/ML IV BOLUS
INTRAVENOUS | Status: DC | PRN
  Administered 2017-12-08 (×4): 20 mg via INTRAVENOUS

## 2017-12-08 MED ORDER — PROPOFOL 500 MG/50ML IV EMUL
INTRAVENOUS | Status: DC | PRN
  Administered 2017-12-08: 100 ug/kg/min via INTRAVENOUS

## 2017-12-08 MED ORDER — MEPERIDINE HCL 100 MG/ML IJ SOLN: 6.2500 mg | INTRAMUSCULAR | Status: DC | PRN

## 2017-12-08 MED ORDER — HYDROCODONE-ACETAMINOPHEN 7.5-325 MG PO TABS: 1.0000 | ORAL_TABLET | Freq: Once | ORAL | Status: DC | PRN

## 2017-12-08 MED ORDER — LACTATED RINGERS IV SOLN
INTRAVENOUS | Status: DC
  Administered 2017-12-08: 10:00:00 via INTRAVENOUS

## 2017-12-08 MED ORDER — PROMETHAZINE HCL 25 MG/ML IJ SOLN: 6.2500 mg | INTRAMUSCULAR | Status: DC | PRN

## 2017-12-08 NOTE — Anesthesia Procedure Notes (Signed)
Procedure Name: MAC Date/Time: 12/08/2017 10:30 AM Performed by: Vista Deck, CRNA Pre-anesthesia Checklist: Patient identified, Emergency Drugs available, Suction available, Timeout performed and Patient being monitored Patient Re-evaluated:Patient Re-evaluated prior to induction Oxygen Delivery Method: Nasal Cannula

## 2017-12-08 NOTE — Transfer of Care (Signed)
Immediate Anesthesia Transfer of Care Note  Patient: [name]  Procedure(s) Performed: COLONOSCOPY WITH PROPOFOL (N/A ) ESOPHAGOGASTRODUODENOSCOPY (EGD) WITH PROPOFOL (N/A ) BIOPSY  Patient Location: PACU  Anesthesia Type:MAC  Level of Consciousness: awake, alert  and patient cooperative  Airway & Oxygen Therapy: Patient Spontanous Breathing  Post-op Assessment: Report given to RN and Post -op Vital signs reviewed and stable  Post vital signs: Reviewed and stable  Last Vitals:  Vitals Value Taken Time  BP    Temp    Pulse 71 12/08/2017 11:15 AM  Resp    SpO2 96 % 12/08/2017 11:15 AM  Vitals shown include unvalidated device data.  Last Pain:  Vitals:   12/08/17 1027  TempSrc:   PainSc: 7       Patients Stated Pain Goal: 5 (81/01/75 1025)  Complications: No apparent anesthesia complications

## 2017-12-08 NOTE — Op Note (Signed)
[facility] Patient Name: [name] Procedure Date: 12/08/2017 10:58 AM MRN: [id number] Date of Birth: Nov 23, 1945 Attending MD: Barney Drain MD, MD CSN: [id number] Age: 72 Admit Type: Outpatient Procedure:                Upper GI endoscopy WITH COLD FORCEPS BIOPSY Indications:              Abdominal pain in the right upper quadrant Providers:                Barney Drain MD, MD, Otis Peak B. Sharon Seller, RN,                            Randa Spike, Technician Referring MD:             Fuller Canada Manuella Ghazi MD, MD Medicines:                Propofol per Anesthesia Complications:            No immediate complications. Estimated Blood Loss:     Estimated blood loss was minimal. Procedure:                Pre-Anesthesia Assessment:                           - Prior to the procedure, a History and Physical                            was performed, and patient medications and                            allergies were reviewed. The patient's tolerance of                            previous anesthesia was also reviewed. The risks                            and benefits of the procedure and the sedation                            options and risks were discussed with the patient.                            All questions were answered, and informed consent                            was obtained. Prior Anticoagulants: The patient has                            taken no previous anticoagulant or antiplatelet                            agents. ASA Grade Assessment: II - A patient with                            mild systemic disease. After reviewing the risks  and benefits, the patient was deemed in                            satisfactory condition to undergo the procedure.                           After obtaining informed consent, the endoscope was                            passed under direct vision. Throughout the                            procedure, the patient's blood  pressure, pulse, and                            oxygen saturations were monitored continuously. The                            [id number] ([id number]) scope was introduced through the                            mouth, and advanced to the second part of duodenum.                            The upper GI endoscopy was accomplished without                            difficulty. The patient tolerated the procedure                            well. Scope In: 11:01:15 AM Scope Out: 11:08:15 AM Total Procedure Duration: 0 hours 7 minutes 0 seconds  Findings:      The examined esophagus was normal.      Localized mild inflammation characterized by congestion (edema) and       erythema was found in the gastric antrum. Biopsies were taken with a       cold forceps for Helicobacter pylori testing.      The examined duodenum was normal. Impression:               - NO SOURCE FOR RUQ PAIN IDENTIFIED.                           - MILD Gastritis. Biopsied. Moderate Sedation:      Per Anesthesia Care Recommendation:           - Await pathology results. CONSIDER HIDA SCAN.                           - Resume previous diet.                           - Continue present medications.                           - Return to my office in 4 months.                           -  Patient has a contact number available for                            emergencies. The signs and symptoms of potential                            delayed complications were discussed with the                            patient. Return to normal activities tomorrow.                            Written discharge instructions were provided to the                            patient. Procedure Code(s):        --- Professional ---                           [phone], Esophagogastroduodenoscopy, flexible,                            transoral; with biopsy, single or multiple Diagnosis Code(s):        --- Professional ---                           K29.70,  Gastritis, unspecified, without bleeding                           R10.11, Right upper quadrant pain CPT copyright 2017 [employer]. All rights reserved. The codes documented in this report are preliminary and upon coder review may  be revised to meet current compliance requirements. Barney Drain, MD Barney Drain MD, MD 12/08/2017 11:21:14 AM This report has been signed electronically. Number of Addenda: 0

## 2017-12-08 NOTE — Anesthesia Preprocedure Evaluation (Signed)
Anesthesia Evaluation  Patient identified by MRN, date of birth, ID band Patient awake    Reviewed: Allergy & Precautions, H&P , NPO status , Patient's Chart, lab work & pertinent test results, reviewed documented beta blocker date and time   Airway Mallampati: II  TM Distance: >3 FB Neck ROM: full    Dental no notable dental hx. (+) Dental Advidsory Given   Pulmonary neg pulmonary ROS, resolved, former smoker,    Pulmonary exam normal breath sounds clear to auscultation       Cardiovascular Exercise Tolerance: Good hypertension, On Medications + CAD, + Cardiac Stents and +CHF  negative cardio ROS   Rhythm:regular Rate:Normal     Neuro/Psych negative neurological ROS  negative psych ROS   GI/Hepatic negative GI ROS, Neg liver ROS, GERD  ,  Endo/Other  negative endocrine ROSdiabetes  Renal/GU CRFnegative Renal ROS  negative genitourinary   Musculoskeletal   Abdominal   Peds  Hematology negative hematology ROS (+)   Anesthesia Other Findings Reports anginal experiences largely resolved s/p stent Pending eval with nephrology in 3 weeks (reported Stage 3) Denies DM hx States active without limitations BP 612 systolic at eval  Reproductive/Obstetrics negative OB ROS                             Anesthesia Physical Anesthesia Plan  ASA: IV  Anesthesia Plan: MAC   Post-op Pain Management:    Induction:   PONV Risk Score and Plan:   Airway Management Planned:   Additional Equipment:   Intra-op Plan:   Post-operative Plan:   Informed Consent: I have reviewed the patients History and Physical, chart, labs and discussed the procedure including the risks, benefits and alternatives for the proposed anesthesia with the patient or authorized representative who has indicated his/her understanding and acceptance.   Dental Advisory Given  Plan Discussed with: CRNA and  Anesthesiologist  Anesthesia Plan Comments:         Anesthesia Quick Evaluation

## 2017-12-08 NOTE — Discharge Instructions (Signed)
NO OBVIOUS SOURCE FOR YOUR ABDOMINAL PAIN WAS IDENTIFIED. YOU HAVE MILD GASTRITIS. YOU HAVE MODERATE internal hemorrhoids. YOU HAVE MILD DIVERTICULOSIS IN YOUR SMALL BOWEL AND COLON.  THE LAST PART OF YOUR SMALL BOWEL IS NORMAL. I BIOPSIED YOUR STOMACH.    DRINK WATER TO KEEP YOUR URINE LIGHT YELLOW.   FOLLOW A LOW FAT/HIGH FIBER DIET. AVOID ITEMS THAT CAUSE BLOATING. SEE INFO BELOW.  CONTINUE PROTONIX. TAKE 30 MINUTES PRIOR TO BREAKFAST.   SEE DR. Florene Glen TO DISCUSS THE BENEFITS V. RISKS OF CONTINUING JAKAFI.   YOUR BIOPSY RESULTS WILL BE AVAILABLE IN 7 DAYS. IF YOUR BIOPSIES DO NOT SHOW H PYLORI WE WILL ORDER A HIDA SCAN TO LOOK AT YOUR GALLBLADDER FUNCTION.  FOLLOW UP IN 4 MOS.   ENDOSCOPY Care After Read the instructions outlined below and refer to this sheet in the next week. These discharge instructions provide you with general information on caring for yourself after you leave the hospital. While your treatment has been planned according to the most current medical practices available, unavoidable complications occasionally occur. If you have any problems or questions after discharge, call DR. Hayla Hinger, [phone].  ACTIVITY  You may resume your regular activity, but move at a slower pace for the next 24 hours.   Take frequent rest periods for the next 24 hours.   Walking will help get rid of the air and reduce the bloated feeling in your belly (abdomen).   No driving for 24 hours (because of the medicine (anesthesia) used during the test).   You may shower.   Do not sign any important legal documents or operate any machinery for 24 hours (because of the anesthesia used during the test).    NUTRITION  Drink plenty of fluids.   You may resume your normal diet as instructed by your doctor.   Begin with a light meal and progress to your normal diet. Heavy or fried foods are harder to digest and may make you feel sick to your stomach (nauseated).   Avoid alcoholic  beverages for 24 hours or as instructed.    MEDICATIONS  You may resume your normal medications.   WHAT YOU CAN EXPECT TODAY  Some feelings of bloating in the abdomen.   Passage of more gas than usual.   Spotting of blood in your stool or on the toilet paper  .  IF YOU HAD POLYPS REMOVED DURING THE ENDOSCOPY:  Eat a soft diet IF YOU HAVE NAUSEA, BLOATING, ABDOMINAL PAIN, OR VOMITING.    FINDING OUT THE RESULTS OF YOUR TEST Not all test results are available during your visit. DR. Oneida Alar WILL CALL YOU WITHIN 14 DAYS OF YOUR PROCEDUE WITH YOUR RESULTS. Do not assume everything is normal if you have not heard from DR. Vickie Ponds, CALL HER OFFICE AT [phone].  SEEK IMMEDIATE MEDICAL ATTENTION AND CALL THE OFFICE: [phone] IF:  You have more than a spotting of blood in your stool.   Your belly is swollen (abdominal distention).   You are nauseated or vomiting.   You have a temperature over 101F.   You have abdominal pain or discomfort that is severe or gets worse throughout the day.   Gastritis  Gastritis is an inflammation (the body's way of reacting to injury and/or infection) of the stomach. It is often caused by bacterial (germ) infections. It can also be caused BY ASPIRIN, BC/GOODY POWDER'S, (IBUPROFEN) MOTRIN, OR ALEVE (NAPROXEN), chemicals (including alcohol), SPICY FOODS, and medications. This illness may be associated with generalized malaise (  feeling tired, not well), UPPER ABDOMINAL STOMACH cramps, and fever. One common bacterial cause of gastritis is an organism known as H. Pylori. This can be treated with antibiotics.    High-Fiber Diet A high-fiber diet changes your normal diet to include more whole grains, legumes, fruits, and vegetables. Changes in the diet involve replacing refined carbohydrates with unrefined foods. The calorie level of the diet is essentially unchanged. The Dietary Reference Intake (recommended amount) for adult males is 38 grams per day.  For adult females, it is 25 grams per day. Pregnant and lactating women should consume 28 grams of fiber per day. Fiber is the intact part of a plant that is not broken down during digestion. Functional fiber is fiber that has been isolated from the plant to provide a beneficial effect in the body. PURPOSE  Increase stool bulk.   Ease and regulate bowel movements.   Lower cholesterol.  REDUCE RISK OF COLON CANCER  INDICATIONS THAT YOU NEED MORE FIBER  Constipation and hemorrhoids.   Uncomplicated diverticulosis (intestine condition) and irritable bowel syndrome.   Weight management.   As a protective measure against hardening of the arteries (atherosclerosis), diabetes, and cancer.   GUIDELINES FOR INCREASING FIBER IN THE DIET  Start adding fiber to the diet slowly. A gradual increase of about 5 more grams (2 slices of whole-wheat bread, 2 servings of most fruits or vegetables, or 1 bowl of high-fiber cereal) per day is best. Too rapid an increase in fiber may result in constipation, flatulence, and bloating.   Drink enough water and fluids to keep your urine clear or pale yellow. Water, juice, or caffeine-free drinks are recommended. Not drinking enough fluid may cause constipation.   Eat a variety of high-fiber foods rather than one type of fiber.   Try to increase your intake of fiber through using high-fiber foods rather than fiber pills or supplements that contain small amounts of fiber.   The goal is to change the types of food eaten. Do not supplement your present diet with high-fiber foods, but replace foods in your present diet.    INCLUDE A VARIETY OF FIBER SOURCES  Replace refined and processed grains with whole grains, canned fruits with fresh fruits, and incorporate other fiber sources. White rice, white breads, and most bakery goods contain little or no fiber.   Brown whole-grain rice, buckwheat oats, and many fruits and vegetables are all good sources of fiber.  These include: broccoli, Brussels sprouts, cabbage, cauliflower, beets, sweet potatoes, white potatoes (skin on), carrots, tomatoes, eggplant, squash, berries, fresh fruits, and dried fruits.   Cereals appear to be the richest source of fiber. Cereal fiber is found in whole grains and bran. Bran is the fiber-rich outer coat of cereal grain, which is largely removed in refining. In whole-grain cereals, the bran remains. In breakfast cereals, the largest amount of fiber is found in those with "bran" in their names. The fiber content is sometimes indicated on the label.   You may need to include additional fruits and vegetables each day.   In baking, for 1 cup white flour, you may use the following substitutions:   1 cup whole-wheat flour minus 2 tablespoons.   1/2 cup white flour plus 1/2 cup whole-wheat flour.   Low-Fat Diet BREADS, CEREALS, PASTA, RICE, DRIED PEAS, AND BEANS These products are high in carbohydrates and most are low in fat. Therefore, they can be increased in the diet as substitutes for fatty foods. They too, however, contain calories and  should not be eaten in excess. Cereals can be eaten for snacks as well as for breakfast.  Include foods that contain fiber (fruits, vegetables, whole grains, and legumes). Research shows that fiber may lower blood cholesterol levels, especially the water-soluble fiber found in fruits, vegetables, oat products, and legumes. FRUITS AND VEGETABLES It is good to eat fruits and vegetables. Besides being sources of fiber, both are rich in vitamins and some minerals. They help you get the daily allowances of these nutrients. Fruits and vegetables can be used for snacks and desserts. MEATS Limit lean meat, chicken, [place], and fish to no more than 6 ounces per day. Beef, Pork, and Lamb Use lean cuts of beef, pork, and lamb. Lean cuts include:  Extra-lean ground beef.  Arm roast.  Sirloin tip.  Center-cut ham.  Round steak.  Loin chops.  Rump  roast.  Tenderloin.  Trim all fat off the outside of meats before cooking. It is not necessary to severely decrease the intake of red meat, but lean choices should be made. Lean meat is rich in protein and contains a highly absorbable form of iron. Premenopausal women, in particular, should avoid reducing lean red meat because this could increase the risk for low red blood cells (iron-deficiency anemia). The organ meats, such as liver, sweetbreads, kidneys, and brain are very rich in cholesterol. They should be limited. Chicken and [place] These are good sources of protein. The fat of poultry can be reduced by removing the skin and underlying fat layers before cooking. Chicken and [place] can be substituted for lean red meat in the diet. Poultry should not be fried or covered with high-fat sauces. Fish and Shellfish Fish is a good source of protein. Shellfish contain cholesterol, but they usually are low in saturated fatty acids. The preparation of fish is important. Like chicken and [place], they should not be fried or covered with high-fat sauces. EGGS Egg whites contain no fat or cholesterol. They can be eaten often. Try 1 to 2 egg whites instead of whole eggs in recipes or use egg substitutes that do not contain yolk. MILK AND DAIRY PRODUCTS Use skim or 1% milk instead of 2% or whole milk. Decrease whole milk, natural, and processed cheeses. Use nonfat or low-fat (2%) cottage cheese or low-fat cheeses made from vegetable oils. Choose nonfat or low-fat (1 to 2%) yogurt. Experiment with evaporated skim milk in recipes that call for heavy cream. Substitute low-fat yogurt or low-fat cottage cheese for sour cream in dips and salad dressings. Have at least 2 servings of low-fat dairy products, such as 2 glasses of skim (or 1%) milk each day to help get your daily calcium intake.  FATS AND OILS Reduce the total intake of fats, especially saturated fat. Butterfat, lard, and beef fats are high in saturated fat  and cholesterol. These should be avoided as much as possible. Vegetable fats do not contain cholesterol, but certain vegetable fats, such as coconut oil, palm oil, and palm kernel oil are very high in saturated fats. These should be limited. These fats are often used in bakery goods, processed foods, popcorn, oils, and nondairy creamers. Vegetable shortenings and some peanut butters contain hydrogenated oils, which are also saturated fats. Read the labels on these foods and check for saturated vegetable oils. Unsaturated vegetable oils and fats do not raise blood cholesterol. However, they should be limited because they are fats and are high in calories. Total fat should still be limited to 30% of your daily caloric intake.  Desirable liquid vegetable oils are corn oil, cottonseed oil, olive oil, canola oil, safflower oil, soybean oil, and sunflower oil. Peanut oil is not as good, but small amounts are acceptable. Buy a heart-healthy tub margarine that has no partially hydrogenated oils in the ingredients. Mayonnaise and salad dressings often are made from unsaturated fats, but they should also be limited because of their high calorie and fat content. Seeds, nuts, peanut butter, olives, and avocados are high in fat, but the fat is mainly the unsaturated type. These foods should be limited mainly to avoid excess calories and fat. OTHER EATING TIPS Snacks  Most sweets should be limited as snacks. They tend to be rich in calories and fats, and their caloric content outweighs their nutritional value. Some good choices in snacks are graham crackers, melba toast, soda crackers, bagels (no egg), English muffins, fruits, and vegetables. These snacks are preferable to snack crackers, [place] fries, and chips. Popcorn should be air-popped or cooked in small amounts of liquid vegetable oil. Desserts Eat fruit, low-fat yogurt, and fruit ices. AVOID pastries, cake, and cookies. Sherbet, angel food cake, gelatin dessert,  frozen low-fat yogurt, or other frozen products that do not contain saturated fat (pure fruit juice bars, frozen ice pops) are also acceptable.  COOKING METHODS Choose those methods that use little or no fat. They include: Poaching.  Braising.  Steaming.  Grilling.  Baking.  Stir-frying.  Broiling.  Microwaving.  Foods can be cooked in a nonstick pan without added fat, or use a nonfat cooking spray in regular cookware. Limit fried foods and avoid frying in saturated fat. Add moisture to lean meats by using water, broth, cooking wines, and other nonfat or low-fat sauces along with the cooking methods mentioned above. Soups and stews should be chilled after cooking. The fat that forms on top after a few hours in the refrigerator should be skimmed off. When preparing meals, avoid using excess salt. Salt can contribute to raising blood pressure in some people. EATING AWAY FROM HOME Order entres, potatoes, and vegetables without sauces or butter. When meat exceeds the size of a deck of cards (3 to 4 ounces), the rest can be taken home for another meal. Choose vegetable or fruit salads and ask for low-calorie salad dressings to be served on the side. Use dressings sparingly. Limit high-fat toppings, such as bacon, crumbled eggs, cheese, sunflower seeds, and olives. Ask for heart-healthy tub margarine instead of butter.  Diverticulosis Diverticulosis is a common condition that develops when small pouches (diverticula) form in the wall of the colon OR SMALL BOWEL. The risk of diverticulosis increases with age. It happens more often in people who eat a low-fiber diet. Most individuals with diverticulosis have no symptoms. Those individuals with symptoms usually experience belly (abdominal) pain, constipation, or loose stools (diarrhea).  HOME CARE INSTRUCTIONS Increase the amount of fiber in your diet as directed by your caregiver or dietician. This may reduce symptoms of diverticulosis.  Drink at  least 6 to 8 glasses of water each day to prevent constipation.  Try not to strain when you have a bowel movement.  Avoiding nuts and seeds to prevent complications is NOT NECESSARY.   FOODS HAVING HIGH FIBER CONTENT INCLUDE: Fruits. Apple, peach, pear, tangerine, raisins, prunes.  Vegetables. Brussels sprouts, asparagus, broccoli, cabbage, carrot, cauliflower, romaine lettuce, spinach, summer squash, tomato, winter squash, zucchini.  Starchy Vegetables. Baked beans, kidney beans, lima beans, split peas, lentils, potatoes (with skin).  Grains. Whole wheat bread, brown rice, bran flake  cereal, plain oatmeal, white rice, shredded wheat, bran muffins.   SEEK IMMEDIATE MEDICAL CARE IF: You develop increasing pain or severe bloating.  You have an oral temperature above 101F.  You develop vomiting or bowel movements that are bloody or black.

## 2017-12-08 NOTE — Op Note (Signed)
[facility] Patient Name: [name] Procedure Date: 12/08/2017 10:27 AM MRN: [id number] Date of Birth: March 17, 1946 Attending MD: Barney Drain MD, MD CSN: [id number] Age: 72 Admit Type: Outpatient Procedure:                Colonoscopy, surveillance Indications:              Personal history of colonic polyps-lasttcs Providers:                Barney Drain MD, MD, Otis Peak B. Sharon Seller, RN,                            Randa Spike, Technician Referring MD:             Fuller Canada Manuella Ghazi MD, MD Medicines:                Propofol per Anesthesia Complications:            No immediate complications. Estimated Blood Loss:     Estimated blood loss: none. Procedure:                Pre-Anesthesia Assessment:                           - Prior to the procedure, a History and Physical                            was performed, and patient medications and                            allergies were reviewed. The patient's tolerance of                            previous anesthesia was also reviewed. The risks                            and benefits of the procedure and the sedation                            options and risks were discussed with the patient.                            All questions were answered, and informed consent                            was obtained. Prior Anticoagulants: The patient has                            taken no previous anticoagulant or antiplatelet                            agents. ASA Grade Assessment: II - A patient with                            mild systemic disease. After reviewing the risks  and benefits, the patient was deemed in                            satisfactory condition to undergo the procedure.                            After obtaining informed consent, the colonoscope                            was passed under direct vision. Throughout the                            procedure, the patient's blood pressure, pulse, and                             oxygen saturations were monitored continuously. The                            [id number] ([id number]) scope was introduced through                            the anus and advanced to the 10 cm into the ileum.                            The patient tolerated the procedure well. The                            colonoscopy was somewhat difficult due to poor                            endoscopic visualization and a tortuous colon.                            Successful completion of the procedure was aided by                            straightening and shortening the scope to obtain                            bowel loop reduction, lavage and COLOWRAP. The                            patient tolerated the procedure well. The quality                            of the bowel preparation was adequate to identify                            polyps 6 mm and larger in size. Scope In: 10:43:49 AM Scope Out: 10:56:07 AM Scope Withdrawal Time: 0 hours 9 minutes 40 seconds  Total Procedure Duration: 0 hours 12 minutes 18 seconds  Findings:      The terminal ileum appeared normal.      The recto-sigmoid colon  and sigmoid colon were moderately tortuous.      The exam was otherwise without abnormality.      External and internal hemorrhoids were found during retroflexion. The       hemorrhoids were moderate. Impression:               - The examined portion of the ileum was normal.                           - Tortuous colon.                           - The examination was otherwise normal.                           - External and internal hemorrhoids. Moderate Sedation:      Per Anesthesia Care Recommendation:           - Repeat colonoscopy in 3 years for surveillance                            due to prep. PAOLYPS < 5 mm MAY HAVE BEEN MISSED IN                            THE RIGHT COLON.                           - High fiber diet.                           - Continue present  medications.                           - Await pathology results.                           - Return to my office in 4 months.                           - Patient has a contact number available for                            emergencies. The signs and symptoms of potential                            delayed complications were discussed with the                            patient. Return to normal activities tomorrow.                            Written discharge instructions were provided to the                            patient. Procedure Code(s):        --- Professional ---  45378, Colonoscopy, flexible; diagnostic, including                            collection of specimen(s) by brushing or washing,                            when performed (separate procedure) Diagnosis Code(s):        --- Professional ---                           K64.8, Other hemorrhoids                           Z86.010, Personal history of colonic polyps                           Q43.8, Other specified congenital malformations of                            intestine CPT copyright 2017 [employer] Association. All rights reserved. The codes documented in this report are preliminary and upon coder review may  be revised to meet current compliance requirements. Barney Drain, MD Barney Drain MD, MD 12/08/2017 11:17:38 AM This report has been signed electronically. Number of Addenda: 0

## 2017-12-08 NOTE — Interval H&P Note (Signed)
History and Physical Interval Note:  12/08/2017 8:36 AM  [name]  has presented today for surgery, with the diagnosis of dyspepsia, colon polyps  The various methods of treatment have been discussed with the patient and family. After consideration of risks, benefits and other options for treatment, the patient has consented to  Procedure(s) with comments: COLONOSCOPY WITH PROPOFOL (N/A) - 1:45pm ESOPHAGOGASTRODUODENOSCOPY (EGD) WITH PROPOFOL (N/A) as a surgical intervention .  The patient's history has been reviewed, patient examined, no change in status, stable for surgery.  I have reviewed the patient's chart and labs.  Questions were answered to the patient's satisfaction.     [employer]

## 2017-12-08 NOTE — Anesthesia Postprocedure Evaluation (Signed)
Anesthesia Post Note  Patient: [name]  Procedure(s) Performed: COLONOSCOPY WITH PROPOFOL (N/A ) ESOPHAGOGASTRODUODENOSCOPY (EGD) WITH PROPOFOL (N/A ) BIOPSY  Patient location during evaluation: PACU Anesthesia Type: MAC Level of consciousness: awake and alert and patient cooperative Pain management: satisfactory to patient Respiratory status: spontaneous breathing Cardiovascular status: stable Postop Assessment: no apparent nausea or vomiting Anesthetic complications: no     Last Vitals:  Vitals:   12/08/17 1115 12/08/17 1130  BP: (!) 148/78 (!) 169/78  Pulse: 71 64  Resp: 12 18  Temp: 36.8 C   SpO2: 96% 99%    Last Pain:  Vitals:   12/08/17 1115  TempSrc:   PainSc: 0-No pain                 Teena Mangus

## 2017-12-10 ENCOUNTER — Encounter (HOSPITAL_COMMUNITY): Payer: Self-pay | Admitting: Gastroenterology

## 2017-12-11 ENCOUNTER — Telehealth: Payer: Self-pay | Admitting: Gastroenterology

## 2017-12-11 DIAGNOSIS — I129 Hypertensive chronic kidney disease with stage 1 through stage 4 chronic kidney disease, or unspecified chronic kidney disease: Secondary | ICD-10-CM | POA: Diagnosis not present

## 2017-12-11 DIAGNOSIS — R109 Unspecified abdominal pain: Secondary | ICD-10-CM | POA: Diagnosis not present

## 2017-12-11 DIAGNOSIS — Z79899 Other long term (current) drug therapy: Secondary | ICD-10-CM | POA: Diagnosis not present

## 2017-12-11 DIAGNOSIS — R1011 Right upper quadrant pain: Secondary | ICD-10-CM

## 2017-12-11 DIAGNOSIS — I519 Heart disease, unspecified: Secondary | ICD-10-CM | POA: Diagnosis not present

## 2017-12-11 DIAGNOSIS — N183 Chronic kidney disease, stage 3 (moderate): Secondary | ICD-10-CM | POA: Diagnosis not present

## 2017-12-11 DIAGNOSIS — E1122 Type 2 diabetes mellitus with diabetic chronic kidney disease: Secondary | ICD-10-CM | POA: Diagnosis not present

## 2017-12-11 NOTE — Progress Notes (Signed)
LM for a return call.  

## 2017-12-11 NOTE — Telephone Encounter (Signed)
ORDER A HIDA SCAN, Dx: RUQ ABDOMINAL PAIN.

## 2017-12-11 NOTE — Addendum Note (Signed)
Addended by: Inge Rise on: 12/11/2017 11:06 AM   Modules accepted: Orders

## 2017-12-11 NOTE — Telephone Encounter (Signed)
HIDA scan scheduled for 12/16/17 at 8:00am, npo after midnight, no pain medications.  Called made patient aware of appt details and this is too assess the gallbladder function.

## 2017-12-15 ENCOUNTER — Encounter (HOSPITAL_COMMUNITY)
Admission: RE | Admit: 2017-12-15 | Discharge: 2017-12-15 | Disposition: A | Discharge status: Home / Self Care | Payer: Medicare Other | Source: Ambulatory Visit | Attending: Gastroenterology | Admitting: Gastroenterology

## 2017-12-15 ENCOUNTER — Encounter (HOSPITAL_COMMUNITY): Payer: Self-pay

## 2017-12-15 DIAGNOSIS — R1011 Right upper quadrant pain: Secondary | ICD-10-CM | POA: Diagnosis not present

## 2017-12-15 MED ORDER — TECHNETIUM TC 99M MEBROFENIN IV KIT
5.0000 | PACK | Freq: Once | INTRAVENOUS | Status: AC | PRN
  Administered 2017-12-15: 5.5 via INTRAVENOUS

## 2017-12-15 NOTE — Progress Notes (Signed)
PT is aware. He is scheduled for his HIDA scan tomorrow. He said he is not going to be able to stand the pain much longer. He has pain right below his right ribs and across his abdomen. He said it is hurting all of the time now. He is aware that Dr. Oneida Alar is at the hospital and I will send her a message.

## 2017-12-15 NOTE — Progress Notes (Signed)
PT is aware that Dr. Oneida Alar wants him to have Hida ASAP. He ate a BLT at 7:00 am and had coffee. He has not had anything since. He is aware we will try to get the HIDA moved up ASAP. He is also aware of the referral to [facility].

## 2017-12-15 NOTE — Progress Notes (Signed)
ON RECALL  °

## 2017-12-16 ENCOUNTER — Other Ambulatory Visit: Payer: Self-pay

## 2017-12-16 ENCOUNTER — Other Ambulatory Visit (HOSPITAL_COMMUNITY): Payer: Medicare Other

## 2017-12-16 DIAGNOSIS — R1011 Right upper quadrant pain: Secondary | ICD-10-CM

## 2017-12-21 DIAGNOSIS — E1165 Type 2 diabetes mellitus with hyperglycemia: Secondary | ICD-10-CM | POA: Diagnosis not present

## 2017-12-21 DIAGNOSIS — I10 Essential (primary) hypertension: Secondary | ICD-10-CM | POA: Diagnosis not present

## 2017-12-21 DIAGNOSIS — Z79899 Other long term (current) drug therapy: Secondary | ICD-10-CM | POA: Diagnosis not present

## 2017-12-21 DIAGNOSIS — M1710 Unilateral primary osteoarthritis, unspecified knee: Secondary | ICD-10-CM | POA: Diagnosis not present

## 2017-12-21 DIAGNOSIS — F17210 Nicotine dependence, cigarettes, uncomplicated: Secondary | ICD-10-CM | POA: Diagnosis not present

## 2017-12-21 DIAGNOSIS — E1122 Type 2 diabetes mellitus with diabetic chronic kidney disease: Secondary | ICD-10-CM | POA: Diagnosis not present

## 2017-12-21 DIAGNOSIS — Z299 Encounter for prophylactic measures, unspecified: Secondary | ICD-10-CM | POA: Diagnosis not present

## 2017-12-21 DIAGNOSIS — Z6831 Body mass index (BMI) 31.0-31.9, adult: Secondary | ICD-10-CM | POA: Diagnosis not present

## 2017-12-22 DIAGNOSIS — I130 Hypertensive heart and chronic kidney disease with heart failure and stage 1 through stage 4 chronic kidney disease, or unspecified chronic kidney disease: Secondary | ICD-10-CM | POA: Diagnosis not present

## 2017-12-22 DIAGNOSIS — R600 Localized edema: Secondary | ICD-10-CM | POA: Diagnosis not present

## 2017-12-22 DIAGNOSIS — Z79899 Other long term (current) drug therapy: Secondary | ICD-10-CM | POA: Diagnosis not present

## 2017-12-22 DIAGNOSIS — I509 Heart failure, unspecified: Secondary | ICD-10-CM | POA: Diagnosis not present

## 2017-12-22 DIAGNOSIS — Z87442 Personal history of urinary calculi: Secondary | ICD-10-CM | POA: Diagnosis not present

## 2017-12-22 DIAGNOSIS — Z885 Allergy status to narcotic agent status: Secondary | ICD-10-CM | POA: Diagnosis not present

## 2017-12-22 DIAGNOSIS — Z955 Presence of coronary angioplasty implant and graft: Secondary | ICD-10-CM | POA: Diagnosis not present

## 2017-12-22 DIAGNOSIS — R079 Chest pain, unspecified: Secondary | ICD-10-CM | POA: Diagnosis not present

## 2017-12-22 DIAGNOSIS — D469 Myelodysplastic syndrome, unspecified: Secondary | ICD-10-CM | POA: Diagnosis not present

## 2017-12-22 DIAGNOSIS — Z87898 Personal history of other specified conditions: Secondary | ICD-10-CM | POA: Diagnosis not present

## 2017-12-22 DIAGNOSIS — E790 Hyperuricemia without signs of inflammatory arthritis and tophaceous disease: Secondary | ICD-10-CM | POA: Diagnosis not present

## 2017-12-22 DIAGNOSIS — F419 Anxiety disorder, unspecified: Secondary | ICD-10-CM | POA: Diagnosis not present

## 2017-12-22 DIAGNOSIS — N189 Chronic kidney disease, unspecified: Secondary | ICD-10-CM | POA: Diagnosis not present

## 2017-12-22 DIAGNOSIS — R109 Unspecified abdominal pain: Secondary | ICD-10-CM | POA: Diagnosis not present

## 2017-12-22 DIAGNOSIS — R1011 Right upper quadrant pain: Secondary | ICD-10-CM | POA: Diagnosis not present

## 2017-12-22 DIAGNOSIS — F329 Major depressive disorder, single episode, unspecified: Secondary | ICD-10-CM | POA: Diagnosis not present

## 2017-12-22 DIAGNOSIS — N179 Acute kidney failure, unspecified: Secondary | ICD-10-CM | POA: Diagnosis not present

## 2017-12-23 ENCOUNTER — Encounter: Payer: Self-pay | Admitting: Gastroenterology

## 2018-01-06 ENCOUNTER — Ambulatory Visit: Payer: Medicare Other | Admitting: Cardiology

## 2018-01-06 NOTE — Progress Notes (Deleted)
Clinical Summary Mr. Gleed is a 72 y.o.male seen today for follow up of the following medical problems.  1. CAD - recent chest pain 3-4 week ago. Pressure like feeling midchest, 7/10 in severity. Isolated episode while sitting. +SOB. Had some diaphoresis. Lasted about 45 minutes. Not positional. Martin Majestic to [facility], refused admissions - since that time mild "twinges" I nchest. No SOB or DOE, though fairly sedentary lifestyle.  - nuclear stress test at [facility] showed large area of inferolateral ischemia.  - CAD risk factors: HTN, DM2, +tobacco cigars several years. Father CVA late 12s. Mother with CAD late 58s.   - admit 03/2016 with atypical chest pain, thought to be shoulder issue based on MRI. Admitted a few weeks later with left chest wall hematoma, unclear etiology.  - continues to have sharp pain midchest, 6/10 in severity. Left hand can go numb. +SOB. Worst with deep breaths. Lasts just a few seconds. Can occur at rest or with activity. Some increase in severithy.    - cath Jan 2018, received DES to LCX. 90% OM3 small vessel, treated medically. 60% RCA medically managed - no ACE-I due to poor renal function  - ER visit 08/2017 with chest pain/RUQ pain. Trop neg x2, EKG without acute ischemic changes - prior visit Jan 2019 for RUQ pain. CT and RUQ [place] benign.    - epigastric pain, nonspecific pain. Up 8/10 in severity. Can occur at rest or with activity. Can start RLQ abdomen and cross into midchest. Can have SOB. Can be positional at times. Can last 24 hrs without relief.  - no new DOE. Tolerates daily activities well -   - his kidney doctor stopped ASA, atorvastatin, Toprol per patient report   2. CKD -followed by nephrolog  3. Thrombocytopenia - followed by hematology, recent values have been improving.    4. Chronic diastolic HF - no recent symptoms. No SOB/DOE/LE edema   5. HTN - ACE-I stopped due to prior hyperklaemia.   6. Bradycardia -  noted at 02/2017 kidney doctor, toprol stopped at that time    Past Medical History:  Diagnosis Date  . Anxiety   . Arthritis   . BPH (benign prostatic hyperplasia)   . CAD (coronary artery disease)    DES to circumflex 07/2016, moderate residual LAD and RCA, small 90% OM3 - managed medically  . Chronic lower back pain   . Depression   . Essential hypertension   . Gastritis   . GERD (gastroesophageal reflux disease)   . History of kidney stones   . Leukemia ([place])   . 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 Pneumonia X 1  . Renal insufficiency   . Spleen enlarged   . Type II diabetes mellitus (HCC)      Allergies  Allergen Reactions  . Hydrocodone Itching     Current Outpatient Medications  Medication Sig Dispense Refill  . acetaminophen (TYLENOL) 500 MG tablet Take 1,000 mg by mouth 2 (two) times daily as needed for mild pain or moderate pain.     Marland Kitchen allopurinol (ZYLOPRIM) 300 MG tablet Take 150 mg by mouth daily.     Marland Kitchen amLODipine (NORVASC) 10 MG tablet Take 1 tablet (10 mg total) by mouth daily. 90 tablet 1  . DULoxetine (CYMBALTA) 60 MG capsule Take 60 mg by mouth daily.     Marland Kitchen LORazepam (ATIVAN) 1 MG tablet Take 1 mg by mouth 2 (two) times daily.  2  .  Multiple Vitamin (MULTIVITAMIN WITH MINERALS) TABS tablet Take 1 tablet by mouth daily.    . nitroGLYCERIN (NITROSTAT) 0.4 MG SL tablet Place 0.4 mg under the tongue every 5 (five) minutes as needed for chest pain.    Marland Kitchen oxyCODONE-acetaminophen (PERCOCET/ROXICET) 5-325 MG tablet Take 1-2 tablets by mouth every 6 (six) hours as needed for severe pain. 15 tablet 0  . pantoprazole (PROTONIX) 40 MG tablet 1 po 30 mins prior to first meal (Patient taking differently: Take 40 mg by mouth daily. 1 po 30 mins prior to first meal) 30 tablet 11  . ruxolitinib phosphate (JAKAFI) 5 MG tablet Take 5-10 mg by mouth 2 (two) times daily. '5mg'$  in the morning and '10mg'$  at bedtime    . tiZANidine  (ZANAFLEX) 4 MG tablet Take 1 tablet by mouth 2 (two) times daily.    . traZODone (DESYREL) 50 MG tablet Take 50 mg by mouth at bedtime as needed for sleep.     No current facility-administered medications for this visit.      Past Surgical History:  Procedure Laterality Date  . BACK SURGERY    . BIOPSY  12/08/2017   Procedure: BIOPSY;  Surgeon: Danie Binder, MD;  [facility];  Service: Endoscopy;;  gastric  . BONE MARROW BIOPSY     x 3 times  . CARDIAC CATHETERIZATION N/A 08/12/2016   Procedure: Left Heart Cath and Coronary Angiography;  Surgeon: Jettie Booze, MD;  Location: [place] [facility];  Service: Cardiovascular;  Laterality: N/A;  . CARDIAC CATHETERIZATION N/A 08/12/2016   Procedure: Coronary Stent Intervention;  Surgeon: Jettie Booze, MD;  Location: [place] [facility];  Service: Cardiovascular;  Laterality: N/A;  . CARDIAC CATHETERIZATION  2000s X 1  . COLONOSCOPY WITH PROPOFOL N/A 02/01/2013   SLF: 1. 23 colon polyps removed. 2 retrieved. 2. Mild diverticulosis in teh sigmoid colon 3. Small internal hemorrhoids 4. The colon is redundant   . COLONOSCOPY WITH PROPOFOL N/A 12/08/2017   Procedure: COLONOSCOPY WITH PROPOFOL;  Surgeon: Danie Binder, MD;  [facility];  Service: Endoscopy;  Laterality: N/A;  1:45pm  . CYSTOSCOPY W/ STONE MANIPULATION    . ESOPHAGOGASTRODUODENOSCOPY (EGD) WITH PROPOFOL N/A 02/01/2013   SLF: 1. Moderate erosive gastritis  . ESOPHAGOGASTRODUODENOSCOPY (EGD) WITH PROPOFOL N/A 12/08/2017   Procedure: ESOPHAGOGASTRODUODENOSCOPY (EGD) WITH PROPOFOL;  Surgeon: Danie Binder, MD;  [facility];  Service: Endoscopy;  Laterality: N/A;  . LUMBAR [place]  . POLYPECTOMY N/A 02/01/2013   Procedure: POLYPECTOMY;  Surgeon: Danie Binder, MD;  Location: [facility];  Service: Endoscopy;  Laterality: N/A;     Allergies  Allergen Reactions  . Hydrocodone Itching      Family History  Problem Relation Age  of Onset  . Heart attack Mother   . CVA Mother   . Heart attack Father   . Colon cancer Neg Hx      Social History Mr. Rea reports that he quit smoking about 4 years ago. His smoking use included cigars. He started smoking about 42 years ago. He has a 20.00 pack-year smoking history. He has never used smokeless tobacco. Mr. Nordin reports that he does not drink alcohol.   Review of Systems CONSTITUTIONAL: No weight loss, fever, chills, weakness or fatigue.  HEENT: Eyes: No visual loss, blurred vision, double vision or yellow sclerae.No hearing loss, sneezing, congestion, runny nose or sore throat.  SKIN: No rash or itching.  CARDIOVASCULAR:  RESPIRATORY:  No shortness of breath, cough or sputum.  GASTROINTESTINAL: No anorexia, nausea, vomiting or diarrhea. No abdominal pain or blood.  GENITOURINARY: No burning on urination, no polyuria NEUROLOGICAL: No headache, dizziness, syncope, paralysis, ataxia, numbness or tingling in the extremities. No change in bowel or bladder control.  MUSCULOSKELETAL: No muscle, back pain, joint pain or stiffness.  LYMPHATICS: No enlarged nodes. No history of splenectomy.  PSYCHIATRIC: No history of depression or anxiety.  ENDOCRINOLOGIC: No reports of sweating, cold or heat intolerance. No polyuria or polydipsia.  Marland Kitchen   Physical Examination There were no vitals filed for this visit. There were no vitals filed for this visit.  Gen: resting comfortably, no acute distress HEENT: no scleral icterus, pupils equal round and reactive, no palptable cervical adenopathy,  CV Resp: Clear to auscultation bilaterally GI: abdomen is soft, non-tender, non-distended, normal bowel sounds, no hepatosplenomegaly MSK: extremities are warm, no edema.  Skin: warm, no rash Neuro:  no focal deficits Psych: appropriate affect   Diagnostic Studies Jan 2018 cath  Dist RCA lesion, 60 %stenosed.  3rd Mrg lesion, 90 %stenosed. This was a small vessel.  Mid LAD  lesion, 40 %stenosed.  Ost LAD lesion, 25 %stenosed.  Mid Cx lesion, 95 %stenosed. This was the culprit lesion. A STENT SYNERGY DES 3X20 drug eluting stent was successfully placed., postdilated to 3.5  Post intervention, there is a 0% residual stenosis.  LV end diastolic pressure is normal.  There is no aortic valve stenosis.  Significant tortuosity in the right subclavian. If emergency cath was needed in the future, would not use right radial approach.  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 A Synergy stent was used to give him the benefit of a drug-eluting stent but also allow for shortening of antiplatelet therapy duration. For now, continue dual antiplatelet therapy. His platelet count is normal. I will speak to Dr. Harl Bowie regarding this issue.        Assessment and Plan  1.CAD/Chest pain - recent symptoms atypical for cardiac etiology - medications limited by renal dysfnction, hyperkalemia in the past, and bradycardia  2. HTN - elevated in clinic. We will start norvasc 66m daily. Present bp log in 1 week  3. Chronic diastolic HF - at goal, continue current meds  4. Abdominal pain - refer to GI       JArnoldo Lenis M.D.

## 2018-01-12 DIAGNOSIS — D469 Myelodysplastic syndrome, unspecified: Secondary | ICD-10-CM | POA: Diagnosis not present

## 2018-01-19 ENCOUNTER — Encounter: Payer: Self-pay | Admitting: Gastroenterology

## 2018-01-20 DIAGNOSIS — Z299 Encounter for prophylactic measures, unspecified: Secondary | ICD-10-CM | POA: Diagnosis not present

## 2018-01-20 DIAGNOSIS — I10 Essential (primary) hypertension: Secondary | ICD-10-CM | POA: Diagnosis not present

## 2018-01-20 DIAGNOSIS — Z6832 Body mass index (BMI) 32.0-32.9, adult: Secondary | ICD-10-CM | POA: Diagnosis not present

## 2018-01-20 DIAGNOSIS — E1165 Type 2 diabetes mellitus with hyperglycemia: Secondary | ICD-10-CM | POA: Diagnosis not present

## 2018-01-20 DIAGNOSIS — M549 Dorsalgia, unspecified: Secondary | ICD-10-CM | POA: Diagnosis not present

## 2018-01-20 DIAGNOSIS — E1122 Type 2 diabetes mellitus with diabetic chronic kidney disease: Secondary | ICD-10-CM | POA: Diagnosis not present

## 2018-02-09 ENCOUNTER — Other Ambulatory Visit: Payer: Self-pay

## 2018-02-09 ENCOUNTER — Ambulatory Visit (INDEPENDENT_AMBULATORY_CARE_PROVIDER_SITE_OTHER): Payer: Medicare Other | Admitting: Cardiology

## 2018-02-09 ENCOUNTER — Encounter: Payer: Self-pay | Admitting: Cardiology

## 2018-02-09 VITALS — BP 203/93 | HR 66 | Ht 72.0 in | Wt 206.0 lb

## 2018-02-09 DIAGNOSIS — I10 Essential (primary) hypertension: Secondary | ICD-10-CM

## 2018-02-09 DIAGNOSIS — I5032 Chronic diastolic (congestive) heart failure: Secondary | ICD-10-CM

## 2018-02-09 DIAGNOSIS — I2510 Atherosclerotic heart disease of native coronary artery without angina pectoris: Secondary | ICD-10-CM | POA: Diagnosis not present

## 2018-02-09 MED ORDER — HYDRALAZINE HCL 25 MG PO TABS: 25.0000 mg | ORAL_TABLET | Freq: Three times a day (TID) | ORAL | 1 refills | Status: DC

## 2018-02-09 NOTE — Progress Notes (Signed)
Clinical Summary [name] is a 72 y.o.male seen today for follow up of the following medical problems.  1. CAD - recent chest pain 3-4 week ago. Pressure like feeling midchest, 7/10 in severity. Isolated episode while sitting. +SOB. Had some diaphoresis. Lasted about 45 minutes. Not positional. Martin Majestic to [facility], refused admissions - since that time mild "twinges" I nchest. No SOB or DOE, though fairly sedentary lifestyle.  - nuclear stress test at [facility] showed large area of inferolateral ischemia.  - CAD risk factors: HTN, DM2, +tobacco cigars several years. Father CVA late 57s. Mother with CAD late 52s.   - admit 03/2016 with atypical chest pain, thought to be shoulder issue based on MRI. Admitted a few weeks later with left chest wall hematoma, unclear etiology.  - continues to have sharp pain midchest, 6/10 in severity. Left hand can go numb. +SOB. Worst with deep breaths. Lasts just a few seconds. Can occur at rest or with activity. Some increase in severithy.    - cath Jan 2018, received DES to LCX. 90% OM3 small vessel, treated medically. 60% RCA medically managed - no ACE-I due to poor renal function  - ER visit 08/2017 with chest pain/RUQ pain. Trop neg x2, EKG without acute ischemic changes - prior visit Jan 2019 for RUQ pain. CT and RUQ [place] benign.    - epigastric pain, nonspecific pain. Up 8/10 in severity. Can occur at rest or with activity. Can start RLQ abdomen and cross into midchest. Can have SOB. Can be positional at times. Can last 24 hrs without relief.  - no new DOE. Tolerates daily activities well   - his kidney doctor stopped ASA, atorvastatin, Toprol per patient report - from notes was bradycardic during 02/2017 nephrology visit, toprol was held (pulse 53 by notes), had been on '50mg'$ . Lisinopril stopped due to hyperkalemia. Off ASA due to low platelets.   - no chest pain, no SOB or DOE   2. CKD -followed by nephrology  3.  Thrombocytopenia - followed by hematology, recent values have been improving.    4. Chronic diastolic HF - no recent symptoms. No SOB/DOE/LE edema  - no recent edema.    5. HTN - ACE-I stopped due to prior hyperklaemia.   - checks at home few times a week, typically 160s/80s - constant abdominal pain, 7/10 in severity can last several hours seems to drive up his bp  6. Bradycardia - noted at 02/2017 kidney doctor, toprol stopped at that time     Past Medical History:  Diagnosis Date  . Anxiety   . Arthritis   . BPH (benign prostatic hyperplasia)   . CAD (coronary artery disease)    DES to circumflex 07/2016, moderate residual LAD and RCA, small 90% OM3 - managed medically  . Chronic lower back pain   . Depression   . Essential hypertension   . Gastritis   . GERD (gastroesophageal reflux disease)   . History of kidney stones   . Leukemia ([place])   . 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 Pneumonia X 1  . Renal insufficiency   . Spleen enlarged   . Type II diabetes mellitus (HCC)      Allergies  Allergen Reactions  . Hydrocodone Itching     Current Outpatient Medications  Medication Sig Dispense Refill  . acetaminophen (TYLENOL) 500 MG tablet Take 1,000 mg by mouth 2 (two) times daily as needed for mild pain  or moderate pain.     Marland Kitchen allopurinol (ZYLOPRIM) 300 MG tablet Take 150 mg by mouth daily.     Marland Kitchen amLODipine (NORVASC) 10 MG tablet Take 1 tablet (10 mg total) by mouth daily. 90 tablet 1  . DULoxetine (CYMBALTA) 60 MG capsule Take 60 mg by mouth daily.     Marland Kitchen LORazepam (ATIVAN) 1 MG tablet Take 1 mg by mouth 2 (two) times daily.  2  . Multiple Vitamin (MULTIVITAMIN WITH MINERALS) TABS tablet Take 1 tablet by mouth daily.    . nitroGLYCERIN (NITROSTAT) 0.4 MG SL tablet Place 0.4 mg under the tongue every 5 (five) minutes as needed for chest pain.    Marland Kitchen oxyCODONE-acetaminophen (PERCOCET/ROXICET) 5-325 MG tablet  Take 1-2 tablets by mouth every 6 (six) hours as needed for severe pain. 15 tablet 0  . pantoprazole (PROTONIX) 40 MG tablet 1 po 30 mins prior to first meal (Patient taking differently: Take 40 mg by mouth daily. 1 po 30 mins prior to first meal) 30 tablet 11  . ruxolitinib phosphate (JAKAFI) 5 MG tablet Take 5-10 mg by mouth 2 (two) times daily. '5mg'$  in the morning and '10mg'$  at bedtime    . tiZANidine (ZANAFLEX) 4 MG tablet Take 1 tablet by mouth 2 (two) times daily.    . traZODone (DESYREL) 50 MG tablet Take 50 mg by mouth at bedtime as needed for sleep.     No current facility-administered medications for this visit.      Past Surgical History:  Procedure Laterality Date  . BACK SURGERY    . BIOPSY  12/08/2017   Procedure: BIOPSY;  Surgeon: Danie Binder, MD;  [facility];  Service: Endoscopy;;  gastric  . BONE MARROW BIOPSY     x 3 times  . CARDIAC CATHETERIZATION N/A 08/12/2016   Procedure: Left Heart Cath and Coronary Angiography;  Surgeon: Jettie Booze, MD;  Location: [place] [facility];  Service: Cardiovascular;  Laterality: N/A;  . CARDIAC CATHETERIZATION N/A 08/12/2016   Procedure: Coronary Stent Intervention;  Surgeon: Jettie Booze, MD;  Location: [place] CV LAB;  Service: Cardiovascular;  Laterality: N/A;  . CARDIAC CATHETERIZATION  2000s X 1  . COLONOSCOPY WITH PROPOFOL N/A 02/01/2013   SLF: 1. 23 colon polyps removed. 2 retrieved. 2. Mild diverticulosis in teh sigmoid colon 3. Small internal hemorrhoids 4. The colon is redundant   . COLONOSCOPY WITH PROPOFOL N/A 12/08/2017   Procedure: COLONOSCOPY WITH PROPOFOL;  Surgeon: Danie Binder, MD;  [facility];  Service: Endoscopy;  Laterality: N/A;  1:45pm  . CYSTOSCOPY W/ STONE MANIPULATION    . ESOPHAGOGASTRODUODENOSCOPY (EGD) WITH PROPOFOL N/A 02/01/2013   SLF: 1. Moderate erosive gastritis  . ESOPHAGOGASTRODUODENOSCOPY (EGD) WITH PROPOFOL N/A 12/08/2017   Procedure:  ESOPHAGOGASTRODUODENOSCOPY (EGD) WITH PROPOFOL;  Surgeon: Danie Binder, MD;  [facility];  Service: Endoscopy;  Laterality: N/A;  . LUMBAR [place]  . POLYPECTOMY N/A 02/01/2013   Procedure: POLYPECTOMY;  Surgeon: Danie Binder, MD;  Location: [facility];  Service: Endoscopy;  Laterality: N/A;     Allergies  Allergen Reactions  . Hydrocodone Itching      Family History  Problem Relation Age of Onset  . Heart attack Mother   . CVA Mother   . Heart attack Father   . Colon cancer Neg Hx      Social History [name] reports that he quit smoking about 4 years ago. His smoking use  included cigars. He started smoking about 42 years ago. He has a 20.00 pack-year smoking history. He has never used smokeless tobacco. [name] reports that he does not drink alcohol.   Review of Systems CONSTITUTIONAL: No weight loss, fever, chills, weakness or fatigue.  HEENT: Eyes: No visual loss, blurred vision, double vision or yellow sclerae.No hearing loss, sneezing, congestion, runny nose or sore throat.  SKIN: No rash or itching.  CARDIOVASCULAR: per hpi RESPIRATORY: No shortness of breath, cough or sputum.  GASTROINTESTINAL: No anorexia, nausea, vomiting or diarrhea. No abdominal pain or blood.  GENITOURINARY: No burning on urination, no polyuria NEUROLOGICAL: No headache, dizziness, syncope, paralysis, ataxia, numbness or tingling in the extremities. No change in bowel or bladder control.  MUSCULOSKELETAL: No muscle, back pain, joint pain or stiffness.  LYMPHATICS: No enlarged nodes. No history of splenectomy.  PSYCHIATRIC: No history of depression or anxiety.  ENDOCRINOLOGIC: No reports of sweating, cold or heat intolerance. No polyuria or polydipsia.  Marland Kitchen   Physical Examination Vitals:   02/09/18 1455  BP: (!) 203/93  Pulse: 66  SpO2: 97%   Vitals:   02/09/18 1455  Weight: 206 lb (93.4 kg)  Height: 6' (1.829 m)    Gen: resting comfortably, no acute  distress HEENT: no scleral icterus, pupils equal round and reactive, no palptable cervical adenopathy,  CV: RRR, no m/r/g, no jvd Resp: Clear to auscultation bilaterally GI: abdomen is soft, non-tender, non-distended, normal bowel sounds, no hepatosplenomegaly MSK: extremities are warm, no edema.  Skin: warm, no rash Neuro:  no focal deficits Psych: appropriate affect   Diagnostic Studies Jan 2018 cath  Dist RCA lesion, 60 %stenosed.  3rd Mrg lesion, 90 %stenosed. This was a small vessel.  Mid LAD lesion, 40 %stenosed.  Ost LAD lesion, 25 %stenosed.  Mid Cx lesion, 95 %stenosed. This was the culprit lesion. A STENT SYNERGY DES 3X20 drug eluting stent was successfully placed., postdilated to 3.5  Post intervention, there is a 0% residual stenosis.  LV end diastolic pressure is normal.  There is no aortic valve stenosis.  Significant tortuosity in the right subclavian. If emergency cath was needed in the future, would not use right radial approach.  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 A Synergy stent was used to give him the benefit of a drug-eluting stent but also allow for shortening of antiplatelet therapy duration. For now, continue dual antiplatelet therapy. His platelet count is normal. I will speak to Dr. Harl Bowie regarding this issue.      Assessment and Plan   1.CAD - no recent cardiac chest pain - medications limited by renal dysfnction, hyperkalemia in the past, and bradycardia  - continue current meds  2. HTN  - recheck bp 190/90. Start hydrlazine '25mg'$  tid, report bp readings in 1 week.    3. Chronic diastolic HF - euvolemic, continue current meds  4. Abdominal pain - continue to follow with GI      Arnoldo Lenis, M.D.

## 2018-02-09 NOTE — Patient Instructions (Signed)
Your physician wants you to follow-up in: [place] will receive a reminder letter in the mail two months in advance. If you don't receive a letter, please call our office to schedule the follow-up appointment.  Your physician has recommended you make the following change in your medication:   START HYDRALAZINE 25 [place] physician has requested that you regularly monitor and record your blood pressure readings at home FOR 1 [place]. Please use the same machine at the same time of day to check your readings and record them to bring to your follow-up visit.  Thank you for choosing [facility]!!

## 2018-02-13 ENCOUNTER — Other Ambulatory Visit: Payer: Self-pay | Admitting: Cardiology

## 2018-02-19 DIAGNOSIS — E1122 Type 2 diabetes mellitus with diabetic chronic kidney disease: Secondary | ICD-10-CM | POA: Diagnosis not present

## 2018-02-19 DIAGNOSIS — E1165 Type 2 diabetes mellitus with hyperglycemia: Secondary | ICD-10-CM | POA: Diagnosis not present

## 2018-02-19 DIAGNOSIS — I10 Essential (primary) hypertension: Secondary | ICD-10-CM | POA: Diagnosis not present

## 2018-02-19 DIAGNOSIS — N183 Chronic kidney disease, stage 3 (moderate): Secondary | ICD-10-CM | POA: Diagnosis not present

## 2018-02-19 DIAGNOSIS — Z299 Encounter for prophylactic measures, unspecified: Secondary | ICD-10-CM | POA: Diagnosis not present

## 2018-02-19 DIAGNOSIS — Z6831 Body mass index (BMI) 31.0-31.9, adult: Secondary | ICD-10-CM | POA: Diagnosis not present

## 2018-02-23 DIAGNOSIS — D72829 Elevated white blood cell count, unspecified: Secondary | ICD-10-CM | POA: Diagnosis not present

## 2018-02-23 DIAGNOSIS — D469 Myelodysplastic syndrome, unspecified: Secondary | ICD-10-CM | POA: Diagnosis not present

## 2018-03-08 ENCOUNTER — Other Ambulatory Visit: Payer: Self-pay

## 2018-03-08 ENCOUNTER — Emergency Department (HOSPITAL_COMMUNITY)
Admission: EM | Admit: 2018-03-08 | Discharge: 2018-03-08 | Disposition: A | Discharge status: Home / Self Care | Payer: Medicare Other | Attending: Emergency Medicine | Admitting: Emergency Medicine

## 2018-03-08 ENCOUNTER — Encounter (HOSPITAL_COMMUNITY): Payer: Self-pay | Admitting: Emergency Medicine

## 2018-03-08 ENCOUNTER — Emergency Department (HOSPITAL_COMMUNITY): Payer: Medicare Other

## 2018-03-08 DIAGNOSIS — I130 Hypertensive heart and chronic kidney disease with heart failure and stage 1 through stage 4 chronic kidney disease, or unspecified chronic kidney disease: Secondary | ICD-10-CM | POA: Insufficient documentation

## 2018-03-08 DIAGNOSIS — E119 Type 2 diabetes mellitus without complications: Secondary | ICD-10-CM | POA: Diagnosis not present

## 2018-03-08 DIAGNOSIS — Z79899 Other long term (current) drug therapy: Secondary | ICD-10-CM | POA: Diagnosis not present

## 2018-03-08 DIAGNOSIS — J189 Pneumonia, unspecified organism: Secondary | ICD-10-CM | POA: Diagnosis not present

## 2018-03-08 DIAGNOSIS — I2510 Atherosclerotic heart disease of native coronary artery without angina pectoris: Secondary | ICD-10-CM | POA: Diagnosis not present

## 2018-03-08 DIAGNOSIS — R918 Other nonspecific abnormal finding of lung field: Secondary | ICD-10-CM | POA: Diagnosis not present

## 2018-03-08 DIAGNOSIS — I509 Heart failure, unspecified: Secondary | ICD-10-CM | POA: Insufficient documentation

## 2018-03-08 DIAGNOSIS — N183 Chronic kidney disease, stage 3 (moderate): Secondary | ICD-10-CM | POA: Diagnosis not present

## 2018-03-08 DIAGNOSIS — R160 Hepatomegaly, not elsewhere classified: Secondary | ICD-10-CM | POA: Diagnosis not present

## 2018-03-08 DIAGNOSIS — Z87891 Personal history of nicotine dependence: Secondary | ICD-10-CM | POA: Insufficient documentation

## 2018-03-08 DIAGNOSIS — Z7982 Long term (current) use of aspirin: Secondary | ICD-10-CM | POA: Diagnosis not present

## 2018-03-08 DIAGNOSIS — J181 Lobar pneumonia, unspecified organism: Secondary | ICD-10-CM

## 2018-03-08 DIAGNOSIS — R1031 Right lower quadrant pain: Secondary | ICD-10-CM | POA: Diagnosis not present

## 2018-03-08 LAB — URINALYSIS, ROUTINE W REFLEX MICROSCOPIC
BILIRUBIN URINE: NEGATIVE
Bacteria, UA: NONE SEEN
GLUCOSE, UA: 50 mg/dL — AB
HGB URINE DIPSTICK: NEGATIVE
KETONES UR: NEGATIVE mg/dL
LEUKOCYTES UA: NEGATIVE
NITRITE: NEGATIVE
PH: 7 (ref 5.0–8.0)
Protein, ur: >=300 mg/dL — AB
Specific Gravity, Urine: 1.022 (ref 1.005–1.030)

## 2018-03-08 LAB — COMPREHENSIVE METABOLIC PANEL
ALT: 24 U/L (ref 0–44)
ANION GAP: 8 (ref 5–15)
AST: 42 U/L — ABNORMAL HIGH (ref 15–41)
Albumin: 3 g/dL — ABNORMAL LOW (ref 3.5–5.0)
Alkaline Phosphatase: 108 U/L (ref 38–126)
BUN: 20 mg/dL (ref 8–23)
CALCIUM: 8.5 mg/dL — AB (ref 8.9–10.3)
CHLORIDE: 104 mmol/L (ref 98–111)
CO2: 27 mmol/L (ref 22–32)
CREATININE: 1.97 mg/dL — AB (ref 0.61–1.24)
GFR, EST AFRICAN AMERICAN: 37 mL/min — AB (ref >60)
GFR, EST NON AFRICAN AMERICAN: 32 mL/min — AB (ref >60)
Glucose, Bld: 151 mg/dL — ABNORMAL HIGH (ref 70–99)
Potassium: 4.3 mmol/L (ref 3.5–5.1)
Sodium: 139 mmol/L (ref 135–145)
Total Bilirubin: 1 mg/dL (ref 0.3–1.2)
Total Protein: 6.6 g/dL (ref 6.5–8.1)

## 2018-03-08 LAB — CBC
HCT: 38.3 % — ABNORMAL LOW (ref 39.0–52.0)
HEMOGLOBIN: 12.8 g/dL — AB (ref 13.0–17.0)
MCH: 29.8 pg (ref 26.0–34.0)
MCHC: 33.4 g/dL (ref 30.0–36.0)
MCV: 89.1 fL (ref 78.0–100.0)
Platelets: 293 10*3/uL (ref 150–400)
RBC: 4.3 MIL/uL (ref 4.22–5.81)
RDW: 20 % — AB (ref 11.5–15.5)
WBC: 7.6 10*3/uL (ref 4.0–10.5)

## 2018-03-08 LAB — LIPASE, BLOOD: Lipase: 22 U/L (ref 11–51)

## 2018-03-08 MED ORDER — MORPHINE SULFATE (PF) 4 MG/ML IV SOLN
4.0000 mg | Freq: Once | INTRAVENOUS | Status: AC
  Administered 2018-03-08: 4 mg via INTRAVENOUS
  Filled 2018-03-08: qty 1

## 2018-03-08 MED ORDER — ONDANSETRON HCL 4 MG PO TABS: 4.0000 mg | ORAL_TABLET | Freq: Four times a day (QID) | ORAL | 0 refills | Status: DC

## 2018-03-08 MED ORDER — SODIUM CHLORIDE 0.9 % IV BOLUS
500.0000 mL | Freq: Once | INTRAVENOUS | Status: AC
Start: 2018-03-08 — End: 2018-03-08
  Administered 2018-03-08: 500 mL via INTRAVENOUS

## 2018-03-08 MED ORDER — SODIUM CHLORIDE 0.9 % IV SOLN
500.0000 mg | INTRAVENOUS | Status: DC
  Administered 2018-03-08: 500 mg via INTRAVENOUS
  Filled 2018-03-08: qty 500

## 2018-03-08 MED ORDER — KETOROLAC TROMETHAMINE 30 MG/ML IJ SOLN
30.0000 mg | Freq: Once | INTRAMUSCULAR | Status: AC
  Administered 2018-03-08: 30 mg via INTRAVENOUS
  Filled 2018-03-08: qty 1

## 2018-03-08 MED ORDER — SODIUM CHLORIDE 0.9 % IV SOLN
1.0000 g | Freq: Once | INTRAVENOUS | Status: AC
  Administered 2018-03-08: 1 g via INTRAVENOUS
  Filled 2018-03-08: qty 10

## 2018-03-08 MED ORDER — AZITHROMYCIN 250 MG PO TABS: ORAL_TABLET | ORAL | 0 refills | Status: DC

## 2018-03-08 MED ORDER — HYDROMORPHONE HCL 2 MG PO TABS: 2.0000 mg | ORAL_TABLET | ORAL | 0 refills | Status: DC | PRN

## 2018-03-08 MED ORDER — ONDANSETRON HCL 4 MG/2ML IJ SOLN
4.0000 mg | Freq: Once | INTRAMUSCULAR | Status: AC
  Administered 2018-03-08: 4 mg via INTRAVENOUS
  Filled 2018-03-08: qty 2

## 2018-03-08 NOTE — ED Triage Notes (Signed)
Patient complaining of right sided abdominal pain radiating into right flank area. Also complains of urinary frequency.

## 2018-03-08 NOTE — ED Provider Notes (Signed)
[facility] EMERGENCY DEPARTMENT Provider Note   CSN: [id number] Arrival date & time: 03/08/18  [place]     History   Chief Complaint Chief Complaint  Patient presents with  . Flank Pain    HPI TEVEN Webb is a 72 y.o. male.  Right flank pain rating to the right lower quadrant for approximately 2 weeks c low-grade fever today.  No dysuria, hematuria, cough, vomiting, diarrhea, substernal chest pain, dyspnea.  Past medical history includes CAD, hypertension, kidney stones, myelodysplastic syndrome, type 2 diabetes, renal insufficiency, several others.  Severity of pain is moderate.  Nothing makes symptoms better or worse.     Past Medical History:  Diagnosis Date  . Anxiety   . Arthritis   . BPH (benign prostatic hyperplasia)   . CAD (coronary artery disease)    DES to circumflex 07/2016, moderate residual LAD and RCA, small 90% OM3 - managed medically  . Chronic lower back pain   . Depression   . Essential hypertension   . Gastritis   . GERD (gastroesophageal reflux disease)   . History of kidney stones   . Leukemia ([place])   . 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[place])    Likely MDS/MPN, unclassifiable - folowed [facility]  . Pneumonia X 1  . Renal insufficiency   . Spleen enlarged   . Type II diabetes mellitus [facility] [place])     Patient Active Problem List   Diagnosis Date Noted  . RUQ abdominal pain 11/18/2017  . Acute CHF (congestive heart failure) ([place]) 09/15/2016  . Essential hypertension 08/14/2016  . Headache 08/14/2016  . Hyperlipidemia LDL goal <70 08/14/2016  . Abnormal nuclear stress test   . 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 Acute respiratory failure with hypoxia ([place]) 04/27/2016  . Hypoxia   . Hematoma 04/25/2016  . Mass of chest wall, left   . Abnormal partial thromboplastin time (PTT)   . 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 Coronary artery disease due to lipid rich plaque 04/15/2016  . Leukocytosis 04/15/2016  . Diabetes mellitus ([place]) 04/15/2016  .  Chest pain 04/15/2016  . CKD (chronic kidney disease) stage 3, GFR 30-59 ml/min (H[facility]) 04/15/2016  . Left shoulder pain 04/15/2016  . History of adenomatous polyp of colon 01/21/2013  . GERD (gastroesophageal reflux disease) 01/21/2013    Past Surgical History:  Procedure Laterality Date  . BACK SURGERY    . BIOPSY  12/08/2017   Procedure: BIOPSY;  Surgeon: Danie Binder, MD;  Location[facility] END[facility];  Service: Endoscopy;;  gastric  . BONE MARROW BIOPSY     x 3 times  . CARDIAC CATHETERIZATION N/A 08/12/2016   Procedure: Left Heart Cath and Coronary Angiography;  Surgeon: Jettie Booze, MD;  Location: [place] CV LAB;  Service: Cardiovascular;  Laterality: N/A;  . CARDIAC CATHETERIZATION N/A 08/12/2016   Procedure: Coronary Stent Intervention;  Surgeon: Jettie Booze, MD;  Location: [place] CV LAB;  Service: Cardiovascular;  Laterality: N/A;  . CARDIAC CATHETERIZATION  2000s X 1  . COLONOSCOPY WITH PROPOFOL N/A 02/01/2013   SLF: 1. 23 colon polyps removed. 2 retrieved. 2. Mild diverticulosis in teh sigmoid colon 3. Small internal hemorrhoids 4. The colon is redundant   . COLONOSCOPY WITH PROPOFOL N/A 12/08/2017   Procedure: COLONOSCOPY WITH PROPOFOL;  Surgeon: Danie Binder, MD;  Location[facility];  Service: Endoscopy;  Laterality: N/A;  1:45pm  . CYSTOSCOPY W/ STONE MANIPULATION    . ESOPHAGOGASTRODUODENOSCOPY (EGD) WITH PROPOFOL N/A 02/01/2013  SLF: 1. Moderate erosive gastritis  . ESOPHAGOGASTRODUODENOSCOPY (EGD) WITH PROPOFOL N/A 12/08/2017   Procedure: ESOPHAGOGASTRODUODENOSCOPY (EGD) WITH PROPOFOL;  Surgeon: Danie Binder, MD;  Location[facility];  Service: Endoscopy;  Laterality: N/A;  . LUMBAR [place]  . POLYPECTOMY N/A 02/01/2013   Procedure: POLYPECTOMY;  Surgeon: Danie Binder, MD;  Location: [facility] ORS;  Service: Endoscopy;  Laterality: N/A;        Home Medications    Prior to Admission medications   Medication Sig Start Date End  Date Taking? Authorizing Provider  acetaminophen (TYLENOL) 500 MG tablet Take 1,000 mg by mouth 2 (two) times daily as needed for mild pain or moderate pain.    Yes [provider]  amLODipine (NORVASC) 10 MG tablet TAKE 1 TABLET BY MOUTH ONCE DAILY 02/15/18  Yes Branch, Alphonse Guild, MD  aspirin EC 81 MG tablet Take 81 mg by mouth daily.   Yes [provider]  DULoxetine (CYMBALTA) 60 MG capsule Take 60 mg by mouth daily.  04/09/16  Yes [provider]  hydrALAZINE (APRESOLINE) 25 MG tablet Take 1 tablet (25 mg total) by mouth 3 (three) times daily. 02/09/18 05/10/18 Yes BranchAlphonse Guild, MD  HYDROcodone-acetaminophen (NORCO/VICODIN) 5-325 MG tablet Take 1 tablet by mouth 3 (three) times daily. 02/21/18  Yes [provider]  LORazepam (ATIVAN) 1 MG tablet Take 1 mg by mouth 2 (two) times daily. 11/19/17  Yes [provider]  Multiple Vitamin (MULTIVITAMIN WITH MINERALS) TABS tablet Take 1 tablet by mouth daily.   Yes [provider]  nitroGLYCERIN (NITROSTAT) 0.4 MG SL tablet Place 0.4 mg under the tongue every 5 (five) minutes as needed for chest pain.   Yes [provider]  pantoprazole (PROTONIX) 40 MG tablet 1 po 30 mins prior to first meal Patient taking differently: Take 40 mg by mouth daily. 1 po 30 mins prior to first meal 11/18/17  Yes Fields, Sandi L, MD  tiZANidine (ZANAFLEX) 4 MG tablet Take 1 tablet by mouth 2 (two) times daily. 07/13/17  Yes [provider]  traZODone (DESYREL) 50 MG tablet Take 50 mg by mouth at bedtime as needed for sleep. 07/18/16  Yes [provider]  azithromycin (ZITHROMAX) 250 MG tablet 1 tablet daily starting Tuesday evening for 4 days 03/08/18   Nat Christen, MD  HYDROmorphone (DILAUDID) 2 MG tablet Take 1 tablet (2 mg total) by mouth every 4 (four) hours as needed for severe pain. 03/08/18   Nat Christen, MD  ondansetron (ZOFRAN) 4 MG tablet Take 1 tablet (4 mg total) by mouth every 6 (six)  hours. 03/08/18   Nat Christen, MD  oxyCODONE-acetaminophen (PERCOCET/ROXICET) 5-325 MG tablet Take 1-2 tablets by mouth every 6 (six) hours as needed for severe pain. Patient not taking: Reported on 03/08/2018 11/23/17   Hayden Rasmussen, MD  ruxolitinib phosphate (JAKAFI) 5 MG tablet Take 5-10 mg by mouth 2 (two) times daily. 14m in the morning and 163mat bedtime    [provider]    Family History Family History  Problem Relation Age of Onset  . Heart attack Mother   . CVA Mother   . Heart attack Father   . Colon cancer Neg Hx     Social History Social History   Tobacco Use  . Smoking status: Former Smoker    Packs/day: 1.00    Years: 20.00    Pack years: 20.00    Types: Cigars    Start date: 06/25/1975  Last attempt to quit: 08/04/2013    Years since quitting: 4.5  . Smokeless tobacco: Never Used  Substance Use Topics  . Alcohol use: Yes    Comment: Remote history of ETOH abuse, "when I was young and dumb"   . Drug use: No     Allergies   Hydrocodone   Review of Systems Review of Systems  All other systems reviewed and are negative.    Physical Exam Updated Vital Signs BP (!) 145/67 (BP Location: Left Arm)   Pulse 71   Temp 99.1 F (37.3 C) (Oral)   Resp 18   Ht 6' (1.829 m)   Wt 96.2 kg   SpO2 95%   BMI 28.75 kg/m   Physical Exam  Constitutional: He is oriented to person, place, and time. He appears well-developed and well-nourished.  HENT:  Head: Normocephalic and atraumatic.  Eyes: Conjunctivae are normal.  Neck: Neck supple.  Cardiovascular: Normal rate and regular rhythm.  Pulmonary/Chest: Effort normal and breath sounds normal.  Abdominal: Soft. Bowel sounds are normal.  Genitourinary:  Genitourinary Comments: Minimal right flank tenderness  Musculoskeletal: Normal range of motion.  Neurological: He is alert and oriented to person, place, and time.  Skin: Skin is warm and dry.  Psychiatric: He has a normal mood and affect. His  behavior is normal.  Nursing note and vitals reviewed.    ED Treatments / Results  Labs (all labs ordered are listed, but only abnormal results are displayed) Labs Reviewed  URINALYSIS, ROUTINE W REFLEX MICROSCOPIC - Abnormal; Notable for the following components:      Result Value   APPearance HAZY (*)    Glucose, UA 50 (*)    Protein, ur >=300 (*)    All other components within normal limits  CBC - Abnormal; Notable for the following components:   Hemoglobin 12.8 (*)    HCT 38.3 (*)    RDW 20.0 (*)    All other components within normal limits  COMPREHENSIVE METABOLIC PANEL - Abnormal; Notable for the following components:   Glucose, Bld 151 (*)    Creatinine, Ser 1.97 (*)    Calcium 8.5 (*)    Albumin 3.0 (*)    AST 42 (*)    GFR calc non Af Amer 32 (*)    GFR calc Af Amer 37 (*)    All other components within normal limits  URINE CULTURE  LIPASE, BLOOD    EKG None  Radiology Ct Abdomen Pelvis Wo Contrast  Result Date: 03/08/2018 CLINICAL DATA:  Flank pain EXAM: CT ABDOMEN AND PELVIS WITHOUT CONTRAST TECHNIQUE: Multidetector CT imaging of the abdomen and pelvis was performed following the standard protocol without IV contrast. COMPARISON:  CT 11/23/2017, 08/08/2017 FINDINGS: Lower chest: Irregular consolidation in the right middle lobe suspicious for a pneumonia. No pleural effusion. Coronary vascular calcification. Small hiatal hernia. Hepatobiliary: No focal hepatic abnormality. No calcified gallstone or biliary dilatation. Liver is enlarged with craniocaudad measurement of 22 cm. Pancreas: Unremarkable. No pancreatic ductal dilatation or surrounding inflammatory changes. Spleen: Marked splenomegaly with coronal measurement of 21 cm. Adrenals/Urinary Tract: Adrenal glands are normal. Nonspecific perinephric fat stranding. No hydronephrosis. No ureteral stone. The bladder is unremarkable Stomach/Bowel: Stomach is within normal limits. Appendix appears normal. No evidence of  bowel wall thickening, distention, or inflammatory changes. Vascular/Lymphatic: Extensive aortic atherosclerosis. No aneurysm. No significantly enlarged lymph nodes Reproductive: No masses Other: Fat filled inguinal hernias. No free air or free fluid. Coarse dystrophic calcifications in the left greater than  right gluteal regions. Musculoskeletal: Degenerative changes. No acute or suspicious osseous abnormality. IMPRESSION: 1. Nonspecific perinephric fat stranding. Negative for hydronephrosis or ureteral stone. 2. New irregular consolidation within the right middle lobe, felt consistent with a pneumonia. Short interval CT follow-up in 4-6 weeks suggested to ensure clearing. 3. Hepatosplenomegaly. 4. Hepatosplenomegaly Electronically Signed   By: Donavan Foil M.D.   On: 03/08/2018 19:26   Dg Chest 2 View  Result Date: 03/08/2018 CLINICAL DATA:  Possible pneumonia, short of breath EXAM: CHEST - 2 VIEW COMPARISON:  09/02/2017, 08/31/2017, CT 03/08/2018 FINDINGS: Right middle lobe opacity, corresponding to the CT area of consolidation. No pleural effusion. Cardiomediastinal silhouette is within normal limits. No pneumothorax. IMPRESSION: Mild right middle lobe opacity, likely corresponds to the infiltrate noted on CT. Electronically Signed   By: Donavan Foil M.D.   On: 03/08/2018 21:13    Procedures Procedures (including critical care time)  Medications Ordered in ED Medications  azithromycin (ZITHROMAX) 500 mg in sodium chloride 0.9 % 250 mL IVPB (500 mg Intravenous New Bag/Given 03/08/18 2234)  sodium chloride 0.9 % bolus 500 mL (0 mLs Intravenous Stopped 03/08/18 1939)  ondansetron (ZOFRAN) injection 4 mg (4 mg Intravenous Given 03/08/18 1838)  morphine 4 MG/ML injection 4 mg (4 mg Intravenous Given 03/08/18 1842)  ketorolac (TORADOL) 30 MG/ML injection 30 mg (30 mg Intravenous Given 03/08/18 2047)  cefTRIAXone (ROCEPHIN) 1 g in sodium chloride 0.9 % 100 mL IVPB (0 g Intravenous Stopped 03/08/18 2235)      Initial Impression / Assessment and Plan / ED Course  I have reviewed the triage vital signs and the nursing notes.  Pertinent labs & imaging results that were available during my care of the patient were reviewed by me and considered in my medical decision making (see chart for details).     Patient presents with right flank pain with radiation to the right lower quadrant.  He does not appear toxic or septic.  Low-grade fever on vital signs.  Urinalysis negative for infection.  CT scan showed no obvious kidney stone; however, a consolidation in the right middle lobe was noted.  Will initiate IV Rocephin, IV Zithromax, pain management.  Patient is hemodynamically stable discharge.  Discharge medications Zithromax, Dilaudid 2 mg, Zofran 4 mg.  Final Clinical Impressions(s) / ED Diagnoses   Final diagnoses:  Community acquired pneumonia of right middle lobe of lung [facility])    ED Discharge Orders         Ordered    azithromycin (ZITHROMAX) 250 MG tablet     03/08/18 2329    HYDROmorphone (DILAUDID) 2 MG tablet  Every 4 hours PRN     03/08/18 2329    ondansetron (ZOFRAN) 4 MG tablet  Every 6 hours     03/08/18 2329           Nat Christen, MD 03/09/18 1551

## 2018-03-08 NOTE — Discharge Instructions (Addendum)
Tests show evidence of probable pneumonia in your right middle lung.  Urinalysis was negative for infection.  CT scan showed no kidney stone.  Prescription for antibiotic, pain medicine, nausea medicine.  Follow-up with your primary care doctor.

## 2018-03-10 LAB — URINE CULTURE
CULTURE: NO GROWTH
SPECIAL REQUESTS: NORMAL

## 2018-03-11 DIAGNOSIS — E113293 Type 2 diabetes mellitus with mild nonproliferative diabetic retinopathy without macular edema, bilateral: Secondary | ICD-10-CM | POA: Diagnosis not present

## 2018-03-11 DIAGNOSIS — H2513 Age-related nuclear cataract, bilateral: Secondary | ICD-10-CM | POA: Diagnosis not present

## 2018-03-11 DIAGNOSIS — E119 Type 2 diabetes mellitus without complications: Secondary | ICD-10-CM | POA: Diagnosis not present

## 2018-03-11 DIAGNOSIS — H5211 Myopia, right eye: Secondary | ICD-10-CM | POA: Diagnosis not present

## 2018-03-24 DIAGNOSIS — N183 Chronic kidney disease, stage 3 (moderate): Secondary | ICD-10-CM | POA: Diagnosis not present

## 2018-03-24 DIAGNOSIS — E1165 Type 2 diabetes mellitus with hyperglycemia: Secondary | ICD-10-CM | POA: Diagnosis not present

## 2018-03-24 DIAGNOSIS — D471 Chronic myeloproliferative disease: Secondary | ICD-10-CM | POA: Diagnosis not present

## 2018-03-24 DIAGNOSIS — Z299 Encounter for prophylactic measures, unspecified: Secondary | ICD-10-CM | POA: Diagnosis not present

## 2018-03-24 DIAGNOSIS — Z6830 Body mass index (BMI) 30.0-30.9, adult: Secondary | ICD-10-CM | POA: Diagnosis not present

## 2018-03-24 DIAGNOSIS — Z87891 Personal history of nicotine dependence: Secondary | ICD-10-CM | POA: Diagnosis not present

## 2018-03-24 DIAGNOSIS — I10 Essential (primary) hypertension: Secondary | ICD-10-CM | POA: Diagnosis not present

## 2018-04-06 DIAGNOSIS — R1084 Generalized abdominal pain: Secondary | ICD-10-CM | POA: Diagnosis not present

## 2018-04-06 DIAGNOSIS — D72829 Elevated white blood cell count, unspecified: Secondary | ICD-10-CM | POA: Diagnosis not present

## 2018-04-06 DIAGNOSIS — N189 Chronic kidney disease, unspecified: Secondary | ICD-10-CM | POA: Diagnosis not present

## 2018-04-06 DIAGNOSIS — R630 Anorexia: Secondary | ICD-10-CM | POA: Diagnosis not present

## 2018-04-06 DIAGNOSIS — D469 Myelodysplastic syndrome, unspecified: Secondary | ICD-10-CM | POA: Diagnosis not present

## 2018-04-23 DIAGNOSIS — E1122 Type 2 diabetes mellitus with diabetic chronic kidney disease: Secondary | ICD-10-CM | POA: Diagnosis not present

## 2018-04-23 DIAGNOSIS — Z6828 Body mass index (BMI) 28.0-28.9, adult: Secondary | ICD-10-CM | POA: Diagnosis not present

## 2018-04-23 DIAGNOSIS — I10 Essential (primary) hypertension: Secondary | ICD-10-CM | POA: Diagnosis not present

## 2018-04-23 DIAGNOSIS — N183 Chronic kidney disease, stage 3 (moderate): Secondary | ICD-10-CM | POA: Diagnosis not present

## 2018-04-23 DIAGNOSIS — M545 Low back pain: Secondary | ICD-10-CM | POA: Diagnosis not present

## 2018-04-23 DIAGNOSIS — Z299 Encounter for prophylactic measures, unspecified: Secondary | ICD-10-CM | POA: Diagnosis not present

## 2018-04-23 DIAGNOSIS — Z23 Encounter for immunization: Secondary | ICD-10-CM | POA: Diagnosis not present

## 2018-05-16 ENCOUNTER — Other Ambulatory Visit: Payer: Self-pay

## 2018-05-16 ENCOUNTER — Encounter (HOSPITAL_COMMUNITY): Payer: Self-pay | Admitting: Emergency Medicine

## 2018-05-16 ENCOUNTER — Emergency Department (HOSPITAL_COMMUNITY): Payer: Medicare Other

## 2018-05-16 ENCOUNTER — Emergency Department (HOSPITAL_COMMUNITY)
Admission: EM | Admit: 2018-05-16 | Discharge: 2018-05-16 | Disposition: A | Discharge status: Home / Self Care | Payer: Medicare Other | Attending: Emergency Medicine | Admitting: Emergency Medicine

## 2018-05-16 DIAGNOSIS — R079 Chest pain, unspecified: Secondary | ICD-10-CM | POA: Diagnosis not present

## 2018-05-16 DIAGNOSIS — Z5321 Procedure and treatment not carried out due to patient leaving prior to being seen by health care provider: Secondary | ICD-10-CM | POA: Insufficient documentation

## 2018-05-16 LAB — CBC WITH DIFFERENTIAL/PLATELET
ABS IMMATURE GRANULOCYTES: 2.46 10*3/uL — AB (ref 0.00–0.07)
BASOS PCT: 4 %
Basophils Absolute: 0.4 10*3/uL — ABNORMAL HIGH (ref 0.0–0.1)
EOS ABS: 0.1 10*3/uL (ref 0.0–0.5)
Eosinophils Relative: 1 %
HEMATOCRIT: 39.4 % (ref 39.0–52.0)
Hemoglobin: 12.7 g/dL — ABNORMAL LOW (ref 13.0–17.0)
Immature Granulocytes: 28 %
Lymphocytes Relative: 21 %
Lymphs Abs: 1.8 10*3/uL (ref 0.7–4.0)
MCH: 29.4 pg (ref 26.0–34.0)
MCHC: 32.2 g/dL (ref 30.0–36.0)
MCV: 91.2 fL (ref 80.0–100.0)
MONOS PCT: 9 %
Monocytes Absolute: 0.8 10*3/uL (ref 0.1–1.0)
NEUTROS ABS: 3.2 10*3/uL (ref 1.7–7.7)
Neutrophils Relative %: 37 %
PLATELETS: 346 10*3/uL (ref 150–400)
RBC: 4.32 MIL/uL (ref 4.22–5.81)
RDW: 20.8 % — AB (ref 11.5–15.5)
WBC: 8.7 10*3/uL (ref 4.0–10.5)
nRBC: 1.2 % — ABNORMAL HIGH (ref 0.0–0.2)

## 2018-05-16 LAB — COMPREHENSIVE METABOLIC PANEL
ALT: 24 U/L (ref 0–44)
ANION GAP: 6 (ref 5–15)
AST: 38 U/L (ref 15–41)
Albumin: 2.8 g/dL — ABNORMAL LOW (ref 3.5–5.0)
Alkaline Phosphatase: 111 U/L (ref 38–126)
BILIRUBIN TOTAL: 0.7 mg/dL (ref 0.3–1.2)
BUN: 20 mg/dL (ref 8–23)
CHLORIDE: 110 mmol/L (ref 98–111)
CO2: 26 mmol/L (ref 22–32)
Calcium: 8.4 mg/dL — ABNORMAL LOW (ref 8.9–10.3)
Creatinine, Ser: 2.15 mg/dL — ABNORMAL HIGH (ref 0.61–1.24)
GFR calc Af Amer: 34 mL/min — ABNORMAL LOW (ref >60)
GFR, EST NON AFRICAN AMERICAN: 29 mL/min — AB (ref >60)
Glucose, Bld: 103 mg/dL — ABNORMAL HIGH (ref 70–99)
POTASSIUM: 4.5 mmol/L (ref 3.5–5.1)
Sodium: 142 mmol/L (ref 135–145)
TOTAL PROTEIN: 5.9 g/dL — AB (ref 6.5–8.1)

## 2018-05-16 LAB — TROPONIN I: TROPONIN I: 0.03 ng/mL — AB (ref <0.03)

## 2018-05-16 NOTE — ED Notes (Signed)
Called for room placement, not in lobby.

## 2018-05-16 NOTE — ED Notes (Signed)
Pt called for room placement, not in lobby.

## 2018-05-16 NOTE — ED Triage Notes (Signed)
PT c/o intermittent left sided chest pain radiating down his left arm x1 day.

## 2018-05-16 NOTE — ED Notes (Signed)
Date and time results received: 05/16/18 1933 (use smartphrase ".now" to insert current time)  Test: troponin Critical Value: 0.03  Name of Provider Notified: Dr. Lacinda Axon @ 1933  Orders Received? Or Actions Taken?: no/na

## 2018-05-17 ENCOUNTER — Telehealth: Payer: Self-pay | Admitting: Cardiology

## 2018-05-17 NOTE — Telephone Encounter (Signed)
Pt left w/o being seen in ED yesterday.BP now is 209/96, HR 86     Please advise

## 2018-05-17 NOTE — Telephone Encounter (Signed)
Increase hydralazine to 75mg  tid, come in for bp check Wed. If symptoms with that high of bp he will need to come back to ER.

## 2018-05-17 NOTE — Telephone Encounter (Signed)
Called pt. No answer. Left msg to call back.  

## 2018-05-18 DIAGNOSIS — D469 Myelodysplastic syndrome, unspecified: Secondary | ICD-10-CM | POA: Diagnosis not present

## 2018-05-18 MED ORDER — HYDRALAZINE HCL 50 MG PO TABS: 75.0000 mg | ORAL_TABLET | Freq: Three times a day (TID) | ORAL | 6 refills | Status: DC

## 2018-05-18 NOTE — Telephone Encounter (Signed)
Pt denies symptoms at this time. Instructions given. Pt and wife voiced understanding. Order placed.

## 2018-05-19 ENCOUNTER — Ambulatory Visit: Payer: Medicare Other

## 2018-05-19 DIAGNOSIS — N179 Acute kidney failure, unspecified: Secondary | ICD-10-CM | POA: Diagnosis not present

## 2018-05-19 DIAGNOSIS — R161 Splenomegaly, not elsewhere classified: Secondary | ICD-10-CM | POA: Diagnosis not present

## 2018-05-19 DIAGNOSIS — G4700 Insomnia, unspecified: Secondary | ICD-10-CM | POA: Diagnosis not present

## 2018-05-19 DIAGNOSIS — D469 Myelodysplastic syndrome, unspecified: Secondary | ICD-10-CM | POA: Diagnosis not present

## 2018-05-19 DIAGNOSIS — G8929 Other chronic pain: Secondary | ICD-10-CM | POA: Diagnosis not present

## 2018-05-19 DIAGNOSIS — N270 Small kidney, unilateral: Secondary | ICD-10-CM | POA: Diagnosis present

## 2018-05-19 DIAGNOSIS — M351 Other overlap syndromes: Secondary | ICD-10-CM | POA: Diagnosis present

## 2018-05-19 DIAGNOSIS — Z66 Do not resuscitate: Secondary | ICD-10-CM | POA: Diagnosis present

## 2018-05-19 DIAGNOSIS — R1031 Right lower quadrant pain: Secondary | ICD-10-CM | POA: Diagnosis not present

## 2018-05-19 DIAGNOSIS — N041 Nephrotic syndrome with focal and segmental glomerular lesions: Secondary | ICD-10-CM | POA: Diagnosis not present

## 2018-05-19 DIAGNOSIS — Z955 Presence of coronary angioplasty implant and graft: Secondary | ICD-10-CM | POA: Diagnosis not present

## 2018-05-19 DIAGNOSIS — I2510 Atherosclerotic heart disease of native coronary artery without angina pectoris: Secondary | ICD-10-CM | POA: Diagnosis not present

## 2018-05-19 DIAGNOSIS — M545 Low back pain: Secondary | ICD-10-CM | POA: Diagnosis not present

## 2018-05-19 DIAGNOSIS — R809 Proteinuria, unspecified: Secondary | ICD-10-CM | POA: Diagnosis not present

## 2018-05-19 DIAGNOSIS — D72829 Elevated white blood cell count, unspecified: Secondary | ICD-10-CM | POA: Diagnosis present

## 2018-05-19 DIAGNOSIS — R162 Hepatomegaly with splenomegaly, not elsewhere classified: Secondary | ICD-10-CM | POA: Diagnosis not present

## 2018-05-19 DIAGNOSIS — I129 Hypertensive chronic kidney disease with stage 1 through stage 4 chronic kidney disease, or unspecified chronic kidney disease: Secondary | ICD-10-CM | POA: Diagnosis not present

## 2018-05-19 DIAGNOSIS — E1122 Type 2 diabetes mellitus with diabetic chronic kidney disease: Secondary | ICD-10-CM | POA: Diagnosis not present

## 2018-05-19 DIAGNOSIS — R109 Unspecified abdominal pain: Secondary | ICD-10-CM | POA: Diagnosis not present

## 2018-05-19 DIAGNOSIS — D471 Chronic myeloproliferative disease: Secondary | ICD-10-CM | POA: Diagnosis not present

## 2018-05-19 DIAGNOSIS — K219 Gastro-esophageal reflux disease without esophagitis: Secondary | ICD-10-CM | POA: Diagnosis present

## 2018-05-19 DIAGNOSIS — N183 Chronic kidney disease, stage 3 (moderate): Secondary | ICD-10-CM | POA: Diagnosis present

## 2018-05-25 DIAGNOSIS — D696 Thrombocytopenia, unspecified: Secondary | ICD-10-CM | POA: Diagnosis not present

## 2018-05-25 DIAGNOSIS — M549 Dorsalgia, unspecified: Secondary | ICD-10-CM | POA: Diagnosis not present

## 2018-05-25 DIAGNOSIS — Z6829 Body mass index (BMI) 29.0-29.9, adult: Secondary | ICD-10-CM | POA: Diagnosis not present

## 2018-05-25 DIAGNOSIS — E7800 Pure hypercholesterolemia, unspecified: Secondary | ICD-10-CM | POA: Diagnosis not present

## 2018-05-25 DIAGNOSIS — I10 Essential (primary) hypertension: Secondary | ICD-10-CM | POA: Diagnosis not present

## 2018-05-25 DIAGNOSIS — Z299 Encounter for prophylactic measures, unspecified: Secondary | ICD-10-CM | POA: Diagnosis not present

## 2018-05-28 DIAGNOSIS — D469 Myelodysplastic syndrome, unspecified: Secondary | ICD-10-CM | POA: Diagnosis not present

## 2018-05-28 DIAGNOSIS — Z79899 Other long term (current) drug therapy: Secondary | ICD-10-CM | POA: Diagnosis not present

## 2018-05-28 DIAGNOSIS — N183 Chronic kidney disease, stage 3 (moderate): Secondary | ICD-10-CM | POA: Diagnosis not present

## 2018-05-28 DIAGNOSIS — D72829 Elevated white blood cell count, unspecified: Secondary | ICD-10-CM | POA: Diagnosis not present

## 2018-05-28 DIAGNOSIS — E875 Hyperkalemia: Secondary | ICD-10-CM | POA: Diagnosis not present

## 2018-05-28 DIAGNOSIS — I129 Hypertensive chronic kidney disease with stage 1 through stage 4 chronic kidney disease, or unspecified chronic kidney disease: Secondary | ICD-10-CM | POA: Diagnosis not present

## 2018-06-03 DIAGNOSIS — Z79899 Other long term (current) drug therapy: Secondary | ICD-10-CM | POA: Diagnosis not present

## 2018-06-03 DIAGNOSIS — D469 Myelodysplastic syndrome, unspecified: Secondary | ICD-10-CM | POA: Diagnosis not present

## 2018-06-03 DIAGNOSIS — N183 Chronic kidney disease, stage 3 (moderate): Secondary | ICD-10-CM | POA: Diagnosis not present

## 2018-06-03 DIAGNOSIS — I509 Heart failure, unspecified: Secondary | ICD-10-CM | POA: Diagnosis not present

## 2018-06-03 DIAGNOSIS — E1122 Type 2 diabetes mellitus with diabetic chronic kidney disease: Secondary | ICD-10-CM | POA: Diagnosis not present

## 2018-06-03 DIAGNOSIS — Z23 Encounter for immunization: Secondary | ICD-10-CM | POA: Diagnosis not present

## 2018-06-03 DIAGNOSIS — N058 Unspecified nephritic syndrome with other morphologic changes: Secondary | ICD-10-CM | POA: Diagnosis not present

## 2018-06-03 DIAGNOSIS — I130 Hypertensive heart and chronic kidney disease with heart failure and stage 1 through stage 4 chronic kidney disease, or unspecified chronic kidney disease: Secondary | ICD-10-CM | POA: Diagnosis not present

## 2018-06-08 DIAGNOSIS — I509 Heart failure, unspecified: Secondary | ICD-10-CM | POA: Diagnosis not present

## 2018-06-08 DIAGNOSIS — Z95828 Presence of other vascular implants and grafts: Secondary | ICD-10-CM | POA: Diagnosis not present

## 2018-06-08 DIAGNOSIS — D469 Myelodysplastic syndrome, unspecified: Secondary | ICD-10-CM | POA: Diagnosis not present

## 2018-06-08 DIAGNOSIS — I10 Essential (primary) hypertension: Secondary | ICD-10-CM | POA: Diagnosis not present

## 2018-06-08 DIAGNOSIS — Z87891 Personal history of nicotine dependence: Secondary | ICD-10-CM | POA: Diagnosis not present

## 2018-06-23 DIAGNOSIS — E1165 Type 2 diabetes mellitus with hyperglycemia: Secondary | ICD-10-CM | POA: Diagnosis not present

## 2018-06-23 DIAGNOSIS — I10 Essential (primary) hypertension: Secondary | ICD-10-CM | POA: Diagnosis not present

## 2018-06-23 DIAGNOSIS — N183 Chronic kidney disease, stage 3 (moderate): Secondary | ICD-10-CM | POA: Diagnosis not present

## 2018-06-23 DIAGNOSIS — Z6830 Body mass index (BMI) 30.0-30.9, adult: Secondary | ICD-10-CM | POA: Diagnosis not present

## 2018-06-23 DIAGNOSIS — E1122 Type 2 diabetes mellitus with diabetic chronic kidney disease: Secondary | ICD-10-CM | POA: Diagnosis not present

## 2018-06-23 DIAGNOSIS — D471 Chronic myeloproliferative disease: Secondary | ICD-10-CM | POA: Diagnosis not present

## 2018-06-23 DIAGNOSIS — Z299 Encounter for prophylactic measures, unspecified: Secondary | ICD-10-CM | POA: Diagnosis not present

## 2018-07-22 DIAGNOSIS — Z299 Encounter for prophylactic measures, unspecified: Secondary | ICD-10-CM | POA: Diagnosis not present

## 2018-07-22 DIAGNOSIS — Z6831 Body mass index (BMI) 31.0-31.9, adult: Secondary | ICD-10-CM | POA: Diagnosis not present

## 2018-07-22 DIAGNOSIS — R5383 Other fatigue: Secondary | ICD-10-CM | POA: Diagnosis not present

## 2018-07-22 DIAGNOSIS — R600 Localized edema: Secondary | ICD-10-CM | POA: Diagnosis not present

## 2018-07-22 DIAGNOSIS — Z125 Encounter for screening for malignant neoplasm of prostate: Secondary | ICD-10-CM | POA: Diagnosis not present

## 2018-07-22 DIAGNOSIS — I10 Essential (primary) hypertension: Secondary | ICD-10-CM | POA: Diagnosis not present

## 2018-07-22 DIAGNOSIS — Z79899 Other long term (current) drug therapy: Secondary | ICD-10-CM | POA: Diagnosis not present

## 2018-07-22 DIAGNOSIS — E7800 Pure hypercholesterolemia, unspecified: Secondary | ICD-10-CM | POA: Diagnosis not present

## 2018-07-22 DIAGNOSIS — N183 Chronic kidney disease, stage 3 (moderate): Secondary | ICD-10-CM | POA: Diagnosis not present

## 2018-07-22 DIAGNOSIS — M549 Dorsalgia, unspecified: Secondary | ICD-10-CM | POA: Diagnosis not present

## 2018-07-26 DIAGNOSIS — R600 Localized edema: Secondary | ICD-10-CM | POA: Diagnosis not present

## 2018-08-02 ENCOUNTER — Encounter: Payer: Self-pay | Admitting: Cardiology

## 2018-08-02 ENCOUNTER — Encounter: Payer: Self-pay | Admitting: *Deleted

## 2018-08-02 ENCOUNTER — Ambulatory Visit (INDEPENDENT_AMBULATORY_CARE_PROVIDER_SITE_OTHER): Payer: Medicare Other | Admitting: Cardiology

## 2018-08-02 VITALS — BP 162/70 | HR 70 | Ht 72.0 in | Wt 203.4 lb

## 2018-08-02 DIAGNOSIS — I10 Essential (primary) hypertension: Secondary | ICD-10-CM

## 2018-08-02 DIAGNOSIS — I2510 Atherosclerotic heart disease of native coronary artery without angina pectoris: Secondary | ICD-10-CM | POA: Diagnosis not present

## 2018-08-02 DIAGNOSIS — E782 Mixed hyperlipidemia: Secondary | ICD-10-CM | POA: Diagnosis not present

## 2018-08-02 DIAGNOSIS — I5033 Acute on chronic diastolic (congestive) heart failure: Secondary | ICD-10-CM

## 2018-08-02 MED ORDER — HYDRALAZINE HCL 100 MG PO TABS: 100.0000 mg | ORAL_TABLET | Freq: Three times a day (TID) | ORAL | Status: DC

## 2018-08-02 NOTE — Progress Notes (Signed)
Clinical Summary [name] is a 72 y.o.male seen today for follow up of the following medical problems.  1.CAD - recent chest pain 3-4 week ago. Pressure like feeling midchest, 7/10 in severity. Isolated episode while sitting. +SOB. Had some diaphoresis. Lasted about 45 minutes. Not positional. Martin Majestic to [facility], refused admissions - since that time mild "twinges" I nchest. No SOB or DOE, though fairly sedentary lifestyle.  - nuclear stress test at [facility] showed large area of inferolateral ischemia.  - CAD risk factors: HTN, DM2, +tobacco cigars several years. Father CVA late 22s. Mother with CAD late 86s.   - admit 03/2016 with atypical chest pain, thought to be shoulder issue based on MRI. Admitted a few weeks later with left chest wall hematoma, unclear etiology.    - cath Jan 2018, received DES to LCX. 90% OM3 small vessel, treated medically. 60% RCA medically managed - no ACE-I due to poor renal function  - ER visit 08/2017 with chest pain/RUQ pain. Trop neg x2, EKG without acute ischemic changes - prior visit Jan 2019 for RUQ pain. CT and RUQ [place] benign.   - epigastric pain, nonspecific pain. Up 8/10 in severity. Can occur at rest or with activity. Can start RLQ abdomen and cross into midchest. Can have SOB. Can be positional at times. Can last 24 hrs without relief.  - no new DOE. Tolerates daily activities well   Medication wise no ACE-I due to renal dysfunction and prior hyperkalemia. Had some bradycardia during prior nephrology appt and Toprol was stopped in 02/2017 (had been on toprol '50mg'$ , HR 53 at the time)    2. CKD -followed by nephrology at [facility].  - notes mention possible AIN from his PPI  3. Thrombocytopenia -followed by hematology - Jan 2020 labs Plt 148   4. Chronic diastolic HF - Jan 3845 echo at pcp office showed LVEF 50-55%, abnormal diastolic function - Jan 3646 TSH 8.27, could be playing a role in edema. BNP was 4000. Cr 2.16,  BUN 19 - started on lasix by pcp.  - severe LE edema. Some increased SOB/DOE. No orthopnea.  - weight 193, then up to 208 at Dr Trena Platt office    5. HTN -ACE-I stopped due to prior hyperklaemia and also his CKD (was also on 46mq of KCl at the time)    - hydralazine increased to '75mg'$  tid in 04/2018 due to severely elevated bp's - high bps at home up to 200s.  - some cramps on lasix at times   6. Hyperlipidemia - Jan 2020 labs TC 271 TG 324 HDL 34 LDL 172 - had been on atorvastatin, he reports one of his providers stopped it in the past but sure who and why.  - has been on it 08/2017 at our clinic visit and also heme visit that same month. Did not appear on his med list at 11/2017 GI appt.   SH: has 10 great grandkids  Past Medical History:  Diagnosis Date  . Anxiety   . Arthritis   . BPH (benign prostatic hyperplasia)   . CAD (coronary artery disease)    DES to circumflex 07/2016, moderate residual LAD and RCA, small 90% OM3 - managed medically  . Chronic lower back pain   . Depression   . Essential hypertension   . Gastritis   . GERD (gastroesophageal reflux disease)   . History of kidney stones   . Leukemia ([place]   . 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 Pneumonia X 1  . Renal insufficiency   . Spleen enlarged   . Type II diabetes mellitus (HCC)      Allergies  Allergen Reactions  . Hydrocodone Itching     Current Outpatient Medications  Medication Sig Dispense Refill  . acetaminophen (TYLENOL) 500 MG tablet Take 1,000 mg by mouth 2 (two) times daily as needed for mild pain or moderate pain.     Marland Kitchen amLODipine (NORVASC) 10 MG tablet TAKE 1 TABLET BY MOUTH ONCE DAILY 90 tablet 2  . aspirin EC 81 MG tablet Take 81 mg by mouth daily.    Marland Kitchen azithromycin (ZITHROMAX) 250 MG tablet 1 tablet daily starting Tuesday evening for 4 days 4 each 0  . DULoxetine (CYMBALTA) 60 MG capsule Take 60 mg by mouth daily.     .  hydrALAZINE (APRESOLINE) 50 MG tablet Take 1.5 tablets (75 mg total) by mouth 3 (three) times daily. 135 tablet 6  . HYDROcodone-acetaminophen (NORCO/VICODIN) 5-325 MG tablet Take 1 tablet by mouth 3 (three) times daily.  0  . HYDROmorphone (DILAUDID) 2 MG tablet Take 1 tablet (2 mg total) by mouth every 4 (four) hours as needed for severe pain. 15 tablet 0  . LORazepam (ATIVAN) 1 MG tablet Take 1 mg by mouth 2 (two) times daily.  2  . Multiple Vitamin (MULTIVITAMIN WITH MINERALS) TABS tablet Take 1 tablet by mouth daily.    . nitroGLYCERIN (NITROSTAT) 0.4 MG SL tablet Place 0.4 mg under the tongue every 5 (five) minutes as needed for chest pain.    Marland Kitchen ondansetron (ZOFRAN) 4 MG tablet Take 1 tablet (4 mg total) by mouth every 6 (six) hours. 8 tablet 0  . oxyCODONE-acetaminophen (PERCOCET/ROXICET) 5-325 MG tablet Take 1-2 tablets by mouth every 6 (six) hours as needed for severe pain. (Patient not taking: Reported on 03/08/2018) 15 tablet 0  . pantoprazole (PROTONIX) 40 MG tablet 1 po 30 mins prior to first meal (Patient taking differently: Take 40 mg by mouth daily. 1 po 30 mins prior to first meal) 30 tablet 11  . ruxolitinib phosphate (JAKAFI) 5 MG tablet Take 5-10 mg by mouth 2 (two) times daily. '5mg'$  in the morning and '10mg'$  at bedtime    . tiZANidine (ZANAFLEX) 4 MG tablet Take 1 tablet by mouth 2 (two) times daily.    . traZODone (DESYREL) 50 MG tablet Take 50 mg by mouth at bedtime as needed for sleep.     No current facility-administered medications for this visit.      Past Surgical History:  Procedure Laterality Date  . BACK SURGERY    . BIOPSY  12/08/2017   Procedure: BIOPSY;  Surgeon: Danie Binder, MD;  Location: [facility];  Service: Endoscopy;;  gastric  . BONE MARROW BIOPSY     x 3 times  . CARDIAC CATHETERIZATION N/A 08/12/2016   Procedure: Left Heart Cath and Coronary Angiography;  Surgeon: Jettie Booze, MD;  Location: [place] [facility];  Service: Cardiovascular;   Laterality: N/A;  . CARDIAC CATHETERIZATION N/A 08/12/2016   Procedure: Coronary Stent Intervention;  Surgeon: Jettie Booze, MD;  Location: [place] [facility];  Service: Cardiovascular;  Laterality: N/A;  . CARDIAC CATHETERIZATION  2000s X 1  . COLONOSCOPY WITH PROPOFOL N/A 02/01/2013   SLF: 1. 23 colon polyps removed. 2 retrieved. 2. Mild diverticulosis in teh sigmoid colon 3. Small internal hemorrhoids 4. The colon is redundant   . COLONOSCOPY WITH  PROPOFOL N/A 12/08/2017   Procedure: COLONOSCOPY WITH PROPOFOL;  Surgeon: Danie Binder, MD;  Location: [facility];  Service: Endoscopy;  Laterality: N/A;  1:45pm  . CYSTOSCOPY W/ STONE MANIPULATION    . ESOPHAGOGASTRODUODENOSCOPY (EGD) WITH PROPOFOL N/A 02/01/2013   SLF: 1. Moderate erosive gastritis  . ESOPHAGOGASTRODUODENOSCOPY (EGD) WITH PROPOFOL N/A 12/08/2017   Procedure: ESOPHAGOGASTRODUODENOSCOPY (EGD) WITH PROPOFOL;  Surgeon: Danie Binder, MD;  Location: [facility];  Service: Endoscopy;  Laterality: N/A;  . LUMBAR [place]  . POLYPECTOMY N/A 02/01/2013   Procedure: POLYPECTOMY;  Surgeon: Danie Binder, MD;  Location: [facility];  Service: Endoscopy;  Laterality: N/A;     Allergies  Allergen Reactions  . Hydrocodone Itching      Family History  Problem Relation Age of Onset  . Heart attack Mother   . CVA Mother   . Heart attack Father   . Colon cancer Neg Hx      Social History [name] reports that he quit smoking about 4 years ago. His smoking use included cigars. He started smoking about 43 years ago. He has a 20.00 pack-year smoking history. He has never used smokeless tobacco. [name] reports current alcohol use.   Review of Systems CONSTITUTIONAL: No weight loss, fever, chills, weakness or fatigue.  HEENT: Eyes: No visual loss, blurred vision, double vision or yellow sclerae.No hearing loss, sneezing, congestion, runny nose or sore throat.  SKIN: No rash or itching.  CARDIOVASCULAR: per  hpi RESPIRATORY: No shortness of breath, cough or sputum.  GASTROINTESTINAL: No anorexia, nausea, vomiting or diarrhea. No abdominal pain or blood.  GENITOURINARY: No burning on urination, no polyuria NEUROLOGICAL: No headache, dizziness, syncope, paralysis, ataxia, numbness or tingling in the extremities. No change in bowel or bladder control.  MUSCULOSKELETAL: No muscle, back pain, joint pain or stiffness.  LYMPHATICS: No enlarged nodes. No history of splenectomy.  PSYCHIATRIC: No history of depression or anxiety.  ENDOCRINOLOGIC: No reports of sweating, cold or heat intolerance. No polyuria or polydipsia.  Marland Kitchen   Physical Examination Vitals:   08/02/18 0927  BP: (!) 162/70  Pulse: 70  SpO2: 98%   Vitals:   08/02/18 0927  Weight: 203 lb 6.4 oz (92.3 kg)  Height: 6' (1.829 m)    Gen: resting comfortably, no acute distress HEENT: no scleral icterus, pupils equal round and reactive, no palptable cervical adenopathy,  CV: RRR, no m/r/g, no jvd Resp: Clear to auscultation bilaterally GI: abdomen is soft, non-tender, non-distended, normal bowel sounds, no hepatosplenomegaly MSK: extremities are warm, 2+ bilateral LE edema Skin: warm, no rash Neuro:  no focal deficits Psych: appropriate affect   Diagnostic Studies Jan 2018 cath  Dist RCA lesion, 60 %stenosed.  3rd Mrg lesion, 90 %stenosed. This was a small vessel.  Mid LAD lesion, 40 %stenosed.  Ost LAD lesion, 25 %stenosed.  Mid Cx lesion, 95 %stenosed. This was the culprit lesion. A STENT SYNERGY DES 3X20 drug eluting stent was successfully placed., postdilated to 3.5  Post intervention, there is a 0% residual stenosis.  LV end diastolic pressure is normal.  There is no aortic valve stenosis.  Significant tortuosity in the right subclavian. If emergency cath was needed in the future, would not use right radial approach.  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 A Synergy stent was used to give him the  benefit of a drug-eluting stent but also allow for shortening of antiplatelet therapy duration. For now, continue dual antiplatelet therapy. His platelet  count is normal. I will speak to Dr. Harl Bowie regarding this issue.    Assessment and Plan  1.CAD - no recent chest pain. - med changes/limitations as reported above - continue current meds   2. HTN - bp remains elevated, increase hydralazine to '100mg'$  tid   3. Acute on chronic diastolic HF - evidence of volume overload. Recent echo with pcp shows diastolic dysfunctoin, no other significant fidings - some cramping on lasix, he will try taking every other day to see if better tolerated  4. Hyperlipidemia - why and when his statin is stopped is unclear. I suspect it was stopped during the times he was having issues with abdominal pain. Will not restart atorvastatin at this time, try better tolerated pravastaitn '40mg'$  daily.         Arnoldo Lenis, M.D.

## 2018-08-02 NOTE — Patient Instructions (Signed)
Medication Instructions:  Continue all current medications.  Labwork: none  Testing/Procedures: none  Follow-Up: Pending   Any Other Special Instructions Will Be Listed Below (If Applicable).   If you need a refill on your cardiac medications before your next appointment, please call your pharmacy.  

## 2018-08-05 DIAGNOSIS — I10 Essential (primary) hypertension: Secondary | ICD-10-CM | POA: Diagnosis not present

## 2018-08-05 DIAGNOSIS — R600 Localized edema: Secondary | ICD-10-CM | POA: Diagnosis not present

## 2018-08-05 DIAGNOSIS — Z6830 Body mass index (BMI) 30.0-30.9, adult: Secondary | ICD-10-CM | POA: Diagnosis not present

## 2018-08-05 DIAGNOSIS — E1165 Type 2 diabetes mellitus with hyperglycemia: Secondary | ICD-10-CM | POA: Diagnosis not present

## 2018-08-05 DIAGNOSIS — Z299 Encounter for prophylactic measures, unspecified: Secondary | ICD-10-CM | POA: Diagnosis not present

## 2018-08-05 DIAGNOSIS — F17210 Nicotine dependence, cigarettes, uncomplicated: Secondary | ICD-10-CM | POA: Diagnosis not present

## 2018-08-05 DIAGNOSIS — E1122 Type 2 diabetes mellitus with diabetic chronic kidney disease: Secondary | ICD-10-CM | POA: Diagnosis not present

## 2018-08-11 ENCOUNTER — Telehealth: Payer: Self-pay | Admitting: *Deleted

## 2018-08-11 MED ORDER — PRAVASTATIN SODIUM 40 MG PO TABS: 40.0000 mg | ORAL_TABLET | Freq: Every evening | ORAL | 1 refills | Status: DC

## 2018-08-11 NOTE — Telephone Encounter (Signed)
-----   Message from Arnoldo Lenis, MD sent at 08/07/2018 10:33 AM EST ----- Let patient know I reviewed his labs and notes from other doctors, still remains unclear why his cholesterol medication was stopped. Cholesterol very high, I would like to start him on a mild cholesterol pill that is well tolerated, can we start pravastatin 40mg  daily. I also received the echo his pcp did, his heart pumping function is nice and strong but his heart muscle is a little stiffer than normal, this happens with aging but can cause some swelling. Hopefully the fluid pill will help over time, f/u with me 6 weeks   Zandra Abts MD

## 2018-08-11 NOTE — Telephone Encounter (Signed)
Pt voiced understanding - has been concerned over BP last 2 days BP 187/90 - 179/80 HR 70s-80s - says this has happened the last 2 night after laying down - feels like his "heart is jumping" - says during the day BP WNL - scheduled f/u and pravastatin sent to [facility]

## 2018-08-12 MED ORDER — LABETALOL HCL 200 MG PO TABS: 200.0000 mg | ORAL_TABLET | Freq: Two times a day (BID) | ORAL | 3 refills | Status: DC

## 2018-08-12 NOTE — Addendum Note (Signed)
Addended by: Julian Hy T on: 08/12/2018 04:23 PM   Modules accepted: Orders

## 2018-08-12 NOTE — Telephone Encounter (Signed)
Pt voiced understanding - labetalol sent to [facility] and [place] scheduled

## 2018-08-12 NOTE — Telephone Encounter (Signed)
BP too high, have him start labetlol 200mg  bid. Can he come for a nursing visit for bp and vitals check next week. This medicnie may also help with some of his heart jumping feelings   Zandra Abts MD

## 2018-08-18 ENCOUNTER — Ambulatory Visit (INDEPENDENT_AMBULATORY_CARE_PROVIDER_SITE_OTHER): Payer: Medicare Other | Admitting: *Deleted

## 2018-08-18 VITALS — BP 152/70 | HR 73

## 2018-08-18 DIAGNOSIS — I10 Essential (primary) hypertension: Secondary | ICD-10-CM | POA: Diagnosis not present

## 2018-08-18 NOTE — Progress Notes (Signed)
Patient in office for BP recheck.  Recently started on Labetalol 200mg  twice a day.  Took this morning around 5:30 am.

## 2018-08-19 DIAGNOSIS — F17210 Nicotine dependence, cigarettes, uncomplicated: Secondary | ICD-10-CM | POA: Diagnosis not present

## 2018-08-19 DIAGNOSIS — I10 Essential (primary) hypertension: Secondary | ICD-10-CM | POA: Diagnosis not present

## 2018-08-19 DIAGNOSIS — Z79899 Other long term (current) drug therapy: Secondary | ICD-10-CM | POA: Diagnosis not present

## 2018-08-19 DIAGNOSIS — M542 Cervicalgia: Secondary | ICD-10-CM | POA: Diagnosis not present

## 2018-08-19 DIAGNOSIS — Z299 Encounter for prophylactic measures, unspecified: Secondary | ICD-10-CM | POA: Diagnosis not present

## 2018-08-19 DIAGNOSIS — Z6829 Body mass index (BMI) 29.0-29.9, adult: Secondary | ICD-10-CM | POA: Diagnosis not present

## 2018-08-20 NOTE — Progress Notes (Signed)
BP still elevated, increase labetalol to 400mg  bid. Nursing visit 2 weeks for vitals check   J Ahman Dugdale MD

## 2018-08-20 NOTE — Progress Notes (Signed)
LM to return call.

## 2018-08-25 DIAGNOSIS — A419 Sepsis, unspecified organism: Secondary | ICD-10-CM | POA: Diagnosis not present

## 2018-08-25 DIAGNOSIS — N179 Acute kidney failure, unspecified: Secondary | ICD-10-CM | POA: Diagnosis not present

## 2018-08-25 DIAGNOSIS — I130 Hypertensive heart and chronic kidney disease with heart failure and stage 1 through stage 4 chronic kidney disease, or unspecified chronic kidney disease: Secondary | ICD-10-CM | POA: Diagnosis not present

## 2018-08-25 DIAGNOSIS — J181 Lobar pneumonia, unspecified organism: Secondary | ICD-10-CM | POA: Diagnosis not present

## 2018-08-25 DIAGNOSIS — R404 Transient alteration of awareness: Secondary | ICD-10-CM | POA: Diagnosis not present

## 2018-08-25 DIAGNOSIS — I959 Hypotension, unspecified: Secondary | ICD-10-CM | POA: Diagnosis not present

## 2018-08-25 DIAGNOSIS — I509 Heart failure, unspecified: Secondary | ICD-10-CM | POA: Diagnosis not present

## 2018-08-25 DIAGNOSIS — R4182 Altered mental status, unspecified: Secondary | ICD-10-CM | POA: Diagnosis not present

## 2018-08-25 DIAGNOSIS — R0689 Other abnormalities of breathing: Secondary | ICD-10-CM | POA: Diagnosis not present

## 2018-08-25 DIAGNOSIS — R4020 Unspecified coma: Secondary | ICD-10-CM | POA: Diagnosis not present

## 2018-08-25 DIAGNOSIS — N189 Chronic kidney disease, unspecified: Secondary | ICD-10-CM | POA: Diagnosis not present

## 2018-08-25 DIAGNOSIS — R0902 Hypoxemia: Secondary | ICD-10-CM | POA: Diagnosis not present

## 2018-08-25 DIAGNOSIS — J189 Pneumonia, unspecified organism: Secondary | ICD-10-CM | POA: Diagnosis not present

## 2018-08-25 DIAGNOSIS — J80 Acute respiratory distress syndrome: Secondary | ICD-10-CM | POA: Diagnosis not present

## 2018-08-25 DIAGNOSIS — D649 Anemia, unspecified: Secondary | ICD-10-CM | POA: Diagnosis not present

## 2018-08-25 DIAGNOSIS — E1122 Type 2 diabetes mellitus with diabetic chronic kidney disease: Secondary | ICD-10-CM | POA: Diagnosis not present

## 2018-08-26 DIAGNOSIS — R001 Bradycardia, unspecified: Secondary | ICD-10-CM | POA: Diagnosis not present

## 2018-08-26 DIAGNOSIS — R079 Chest pain, unspecified: Secondary | ICD-10-CM | POA: Diagnosis not present

## 2018-08-26 DIAGNOSIS — R404 Transient alteration of awareness: Secondary | ICD-10-CM | POA: Diagnosis not present

## 2018-08-26 DIAGNOSIS — E8770 Fluid overload, unspecified: Secondary | ICD-10-CM | POA: Diagnosis not present

## 2018-08-26 DIAGNOSIS — I130 Hypertensive heart and chronic kidney disease with heart failure and stage 1 through stage 4 chronic kidney disease, or unspecified chronic kidney disease: Secondary | ICD-10-CM | POA: Diagnosis not present

## 2018-08-26 DIAGNOSIS — N179 Acute kidney failure, unspecified: Secondary | ICD-10-CM | POA: Diagnosis not present

## 2018-08-26 DIAGNOSIS — R9431 Abnormal electrocardiogram [ECG] [EKG]: Secondary | ICD-10-CM | POA: Diagnosis not present

## 2018-08-26 DIAGNOSIS — I5032 Chronic diastolic (congestive) heart failure: Secondary | ICD-10-CM | POA: Diagnosis not present

## 2018-08-26 DIAGNOSIS — J9601 Acute respiratory failure with hypoxia: Secondary | ICD-10-CM | POA: Diagnosis not present

## 2018-08-26 DIAGNOSIS — I351 Nonrheumatic aortic (valve) insufficiency: Secondary | ICD-10-CM | POA: Diagnosis not present

## 2018-08-26 DIAGNOSIS — D471 Chronic myeloproliferative disease: Secondary | ICD-10-CM | POA: Diagnosis not present

## 2018-08-26 DIAGNOSIS — N059 Unspecified nephritic syndrome with unspecified morphologic changes: Secondary | ICD-10-CM | POA: Diagnosis present

## 2018-08-26 DIAGNOSIS — N039 Chronic nephritic syndrome with unspecified morphologic changes: Secondary | ICD-10-CM | POA: Diagnosis not present

## 2018-08-26 DIAGNOSIS — I272 Pulmonary hypertension, unspecified: Secondary | ICD-10-CM | POA: Diagnosis not present

## 2018-08-26 DIAGNOSIS — Z87891 Personal history of nicotine dependence: Secondary | ICD-10-CM | POA: Diagnosis not present

## 2018-08-26 DIAGNOSIS — I2510 Atherosclerotic heart disease of native coronary artery without angina pectoris: Secondary | ICD-10-CM | POA: Diagnosis not present

## 2018-08-26 DIAGNOSIS — R161 Splenomegaly, not elsewhere classified: Secondary | ICD-10-CM | POA: Diagnosis not present

## 2018-08-26 DIAGNOSIS — L89151 Pressure ulcer of sacral region, stage 1: Secondary | ICD-10-CM | POA: Diagnosis not present

## 2018-08-26 DIAGNOSIS — I517 Cardiomegaly: Secondary | ICD-10-CM | POA: Diagnosis not present

## 2018-08-26 DIAGNOSIS — T402X1A Poisoning by other opioids, accidental (unintentional), initial encounter: Secondary | ICD-10-CM | POA: Diagnosis present

## 2018-08-26 DIAGNOSIS — C946 Myelodysplastic disease, not classified: Secondary | ICD-10-CM | POA: Diagnosis not present

## 2018-08-26 DIAGNOSIS — I5030 Unspecified diastolic (congestive) heart failure: Secondary | ICD-10-CM | POA: Diagnosis not present

## 2018-08-26 DIAGNOSIS — J189 Pneumonia, unspecified organism: Secondary | ICD-10-CM | POA: Diagnosis not present

## 2018-08-26 DIAGNOSIS — I493 Ventricular premature depolarization: Secondary | ICD-10-CM | POA: Diagnosis not present

## 2018-08-26 DIAGNOSIS — J690 Pneumonitis due to inhalation of food and vomit: Secondary | ICD-10-CM | POA: Diagnosis not present

## 2018-08-26 DIAGNOSIS — Z8673 Personal history of transient ischemic attack (TIA), and cerebral infarction without residual deficits: Secondary | ICD-10-CM | POA: Diagnosis not present

## 2018-08-26 DIAGNOSIS — J188 Other pneumonia, unspecified organism: Secondary | ICD-10-CM | POA: Diagnosis not present

## 2018-08-26 DIAGNOSIS — J159 Unspecified bacterial pneumonia: Secondary | ICD-10-CM | POA: Diagnosis present

## 2018-08-26 DIAGNOSIS — I509 Heart failure, unspecified: Secondary | ICD-10-CM | POA: Diagnosis not present

## 2018-08-26 DIAGNOSIS — D469 Myelodysplastic syndrome, unspecified: Secondary | ICD-10-CM | POA: Diagnosis not present

## 2018-08-26 DIAGNOSIS — A419 Sepsis, unspecified organism: Secondary | ICD-10-CM | POA: Diagnosis not present

## 2018-08-26 DIAGNOSIS — E8809 Other disorders of plasma-protein metabolism, not elsewhere classified: Secondary | ICD-10-CM | POA: Diagnosis not present

## 2018-08-26 DIAGNOSIS — E1122 Type 2 diabetes mellitus with diabetic chronic kidney disease: Secondary | ICD-10-CM | POA: Diagnosis not present

## 2018-08-26 DIAGNOSIS — R918 Other nonspecific abnormal finding of lung field: Secondary | ICD-10-CM | POA: Diagnosis not present

## 2018-08-26 DIAGNOSIS — I361 Nonrheumatic tricuspid (valve) insufficiency: Secondary | ICD-10-CM | POA: Diagnosis not present

## 2018-08-26 DIAGNOSIS — N189 Chronic kidney disease, unspecified: Secondary | ICD-10-CM | POA: Diagnosis not present

## 2018-08-26 DIAGNOSIS — Z955 Presence of coronary angioplasty implant and graft: Secondary | ICD-10-CM | POA: Diagnosis not present

## 2018-08-26 DIAGNOSIS — E872 Acidosis: Secondary | ICD-10-CM | POA: Diagnosis not present

## 2018-08-26 DIAGNOSIS — D638 Anemia in other chronic diseases classified elsewhere: Secondary | ICD-10-CM | POA: Diagnosis present

## 2018-08-26 DIAGNOSIS — N183 Chronic kidney disease, stage 3 (moderate): Secondary | ICD-10-CM | POA: Diagnosis not present

## 2018-08-26 DIAGNOSIS — I110 Hypertensive heart disease with heart failure: Secondary | ICD-10-CM | POA: Diagnosis not present

## 2018-09-01 NOTE — Progress Notes (Signed)
LM to return call.

## 2018-09-02 NOTE — Progress Notes (Signed)
Attempted to reach pt by phone multiple times, will send Mychart message

## 2018-09-07 DIAGNOSIS — Z79899 Other long term (current) drug therapy: Secondary | ICD-10-CM | POA: Diagnosis not present

## 2018-09-07 DIAGNOSIS — D469 Myelodysplastic syndrome, unspecified: Secondary | ICD-10-CM | POA: Diagnosis not present

## 2018-09-07 DIAGNOSIS — I10 Essential (primary) hypertension: Secondary | ICD-10-CM | POA: Diagnosis not present

## 2018-09-07 DIAGNOSIS — Z95828 Presence of other vascular implants and grafts: Secondary | ICD-10-CM | POA: Diagnosis not present

## 2018-09-07 DIAGNOSIS — Z87891 Personal history of nicotine dependence: Secondary | ICD-10-CM | POA: Diagnosis not present

## 2018-09-07 DIAGNOSIS — N179 Acute kidney failure, unspecified: Secondary | ICD-10-CM | POA: Diagnosis not present

## 2018-09-07 DIAGNOSIS — F418 Other specified anxiety disorders: Secondary | ICD-10-CM | POA: Diagnosis not present

## 2018-09-07 DIAGNOSIS — R109 Unspecified abdominal pain: Secondary | ICD-10-CM | POA: Diagnosis not present

## 2018-09-07 DIAGNOSIS — R600 Localized edema: Secondary | ICD-10-CM | POA: Diagnosis not present

## 2018-09-09 DIAGNOSIS — D473 Essential (hemorrhagic) thrombocythemia: Secondary | ICD-10-CM | POA: Diagnosis not present

## 2018-09-09 DIAGNOSIS — I5030 Unspecified diastolic (congestive) heart failure: Secondary | ICD-10-CM | POA: Diagnosis not present

## 2018-09-09 DIAGNOSIS — N183 Chronic kidney disease, stage 3 (moderate): Secondary | ICD-10-CM | POA: Diagnosis not present

## 2018-09-09 DIAGNOSIS — N184 Chronic kidney disease, stage 4 (severe): Secondary | ICD-10-CM | POA: Diagnosis not present

## 2018-09-09 DIAGNOSIS — Z79899 Other long term (current) drug therapy: Secondary | ICD-10-CM | POA: Diagnosis not present

## 2018-09-09 DIAGNOSIS — E8770 Fluid overload, unspecified: Secondary | ICD-10-CM | POA: Diagnosis not present

## 2018-09-09 DIAGNOSIS — D469 Myelodysplastic syndrome, unspecified: Secondary | ICD-10-CM | POA: Diagnosis not present

## 2018-09-09 DIAGNOSIS — D72829 Elevated white blood cell count, unspecified: Secondary | ICD-10-CM | POA: Diagnosis not present

## 2018-09-09 DIAGNOSIS — I130 Hypertensive heart and chronic kidney disease with heart failure and stage 1 through stage 4 chronic kidney disease, or unspecified chronic kidney disease: Secondary | ICD-10-CM | POA: Diagnosis not present

## 2018-09-17 DIAGNOSIS — E1122 Type 2 diabetes mellitus with diabetic chronic kidney disease: Secondary | ICD-10-CM | POA: Diagnosis not present

## 2018-09-17 DIAGNOSIS — Z6829 Body mass index (BMI) 29.0-29.9, adult: Secondary | ICD-10-CM | POA: Diagnosis not present

## 2018-09-17 DIAGNOSIS — J189 Pneumonia, unspecified organism: Secondary | ICD-10-CM | POA: Diagnosis not present

## 2018-09-17 DIAGNOSIS — Z299 Encounter for prophylactic measures, unspecified: Secondary | ICD-10-CM | POA: Diagnosis not present

## 2018-09-17 DIAGNOSIS — E1165 Type 2 diabetes mellitus with hyperglycemia: Secondary | ICD-10-CM | POA: Diagnosis not present

## 2018-09-17 DIAGNOSIS — I10 Essential (primary) hypertension: Secondary | ICD-10-CM | POA: Diagnosis not present

## 2018-09-17 DIAGNOSIS — N183 Chronic kidney disease, stage 3 (moderate): Secondary | ICD-10-CM | POA: Diagnosis not present

## 2018-10-04 ENCOUNTER — Other Ambulatory Visit: Payer: Self-pay

## 2018-10-04 ENCOUNTER — Encounter: Payer: Self-pay | Admitting: Cardiology

## 2018-10-04 ENCOUNTER — Ambulatory Visit (INDEPENDENT_AMBULATORY_CARE_PROVIDER_SITE_OTHER): Payer: Medicare Other | Admitting: Cardiology

## 2018-10-04 VITALS — BP 157/74 | HR 81 | Ht 72.0 in | Wt 190.0 lb

## 2018-10-04 DIAGNOSIS — I10 Essential (primary) hypertension: Secondary | ICD-10-CM | POA: Diagnosis not present

## 2018-10-04 DIAGNOSIS — E782 Mixed hyperlipidemia: Secondary | ICD-10-CM | POA: Diagnosis not present

## 2018-10-04 DIAGNOSIS — I5033 Acute on chronic diastolic (congestive) heart failure: Secondary | ICD-10-CM | POA: Diagnosis not present

## 2018-10-04 DIAGNOSIS — I2510 Atherosclerotic heart disease of native coronary artery without angina pectoris: Secondary | ICD-10-CM | POA: Diagnosis not present

## 2018-10-04 MED ORDER — NITROGLYCERIN 0.4 MG SL SUBL: 0.4000 mg | SUBLINGUAL_TABLET | SUBLINGUAL | 3 refills | Status: AC | PRN

## 2018-10-04 MED ORDER — LABETALOL HCL 200 MG PO TABS: 200.0000 mg | ORAL_TABLET | Freq: Two times a day (BID) | ORAL | 3 refills | Status: DC

## 2018-10-04 MED ORDER — PRAVASTATIN SODIUM 40 MG PO TABS: 40.0000 mg | ORAL_TABLET | Freq: Every evening | ORAL | 1 refills | Status: DC

## 2018-10-04 NOTE — Patient Instructions (Addendum)
Medication Instructions:   Your physician has recommended you make the following change in your medication:   Start labetalol 200 mg by mouth twice daily  Continue all other medications the same Labwork:  NONE  Testing/Procedures:  NONE  Follow-Up:  Your physician recommends that you schedule a follow-up appointment in: 6 weeks.  Any Other Special Instructions Will Be Listed Below (If Applicable).  If you need a refill on your cardiac medications before your next appointment, please call your pharmacy.

## 2018-10-04 NOTE — Progress Notes (Signed)
Clinical Summary [name] is a 73 y.o.male seen today for follow up of the following medical problems.  1.CAD - nuclear stress test at [facility] showed large area of inferolateral ischemia.  - cath Jan 2018, received DES to LCX. 90% OM3 small vessel, treated medically. 60% RCA medically managed  - ER visit 08/2017 with chest pain/RUQ pain. Trop neg x2, EKG without acute ischemic changes - prior visit Jan 2019 for RUQ pain. CT and RUQ [place] benign.   Medication wise no ACE-I due to renal dysfunction and prior hyperkalemia. Had some bradycardia during prior nephrology appt and Toprol was stopped in 02/2017 (had been on toprol 19m, HR 53 at the time)   - tightening feel left sided chest. 3/10 in severity. Can occur any time. No other associated symptoms. Lasts 2-10 minutes. Can be positional. Started in 08/2018.  - increased SOB/DOE, fatigue.    2. CKD -[facility]  - notes mention possible AIN from his PPI. Notes also mention biopsy confirmed immune complex medicated glomerulonephritis, he did not tolerate cellcept - restarted on lasix 446mbid by nephrology, given IV 8075mn clinic. Hydralazine also held by nephrology    Discharge info from 08/2018 admission with AKI Immune Complex Mediated Glomerulonephritis, chronic -CRN 3.4 at OSH, BL CRN 2.0-2.5. Suspected Pre-renal etiology contributing to development of AKI. Hypotension at OSH may have provoked ATN. Poor PO intake contributing to AKI as well. S/P 2L IVF at OSH. He was recently evaluated in November by Dr. MurFranchot Gallo November who recommended initiation of Cellcept for Glomerulonephritis/Nephrotic range proteinuria. Pharmacy medication reconciliation confirmed that this medication was filled in November, however he self discontinued the medication as he felt it was provoking abdominal pain. Mycophenolate level was obtained and it was undetectable. Renal US Koreamonstrated No hydronephrosis. CRN improved  with IVF and normalization of BP. On day of discharge, CRN improved to 2.87. He was counseled to maintain his follow up appointment with Nephrology on 2/20.   3. Thrombocytopenia -followed by hematology - Jan 2020 labs Plt 148   4. Chronic diastolic HF - Jan [id number] at pcp office showed LVEF 50-55%, abnormal diastolic function - Jan [id number] 8.27, could be playing a role in edema. BNP was 4000. Cr 2.16, BUN 19 - started on lasix by pcp.  - severe LE edema. Some increased SOB/DOE. No orthopnea.  - weight 193, then up to 208 at Dr ShaTrena Plattfice His weight in 05/2018 was 197 lbs  - recent edema, lasix 37m52marted then changed to bid by nephrology 08/2018 [facility] 60-65%, mod pulm HTN     5. HTN - patient is not taking his labetalol as prescribed, remains on hydralazine 100mg17m although this was to be held after his recent discharge. Remains on norvasc 10mg 55my. ARB remains on hold due to renal function.       6. Hyperlipidemia - Jan 2020 labs TC 271 TG 324 HDL 34 LDL 172 - had been on atorvastatin, he reports one of his providers stopped it in the past but sure who and why.  - has been on it 08/2017 at our clinic visit and also heme visit that same month. Did not appear on his med list at 11/2017 GI appt.   - not on pravastatin, he is not sure what happened. From discharge summary was to continue taking.    7. Aspiration pneumonia - admission at [facility] - notes mention admission 08/2018 with  opiate overdse, unintentional and aspiration pneumonia. During that admission lasix held, given IVFs   SH: has 10 great grandkids     Past Medical History:  Diagnosis Date  . Anxiety   . Arthritis   . BPH (benign prostatic hyperplasia)   . CAD (coronary artery disease)    DES to circumflex 07/2016, moderate residual LAD and RCA, small 90% OM3 - managed medically  . Chronic lower back pain   . Depression   . Essential hypertension   .  Gastritis   . GERD (gastroesophageal reflux disease)   . History of kidney stones   . Leukemia (HCC)   . 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 Pneumonia X 1  . Renal insufficiency   . Spleen enlarged   . Type II diabetes mellitus (HCC)      Allergies  Allergen Reactions  . Hydrocodone Itching     Current Outpatient Medications  Medication Sig Dispense Refill  . acetaminophen (TYLENOL) 500 MG tablet Take 1,000 mg by mouth 2 (two) times daily as needed for mild pain or moderate pain.     . amLODipine (NORVASC) 10 MG tablet TAKE 1 TABLET BY MOUTH ONCE DAILY 90 tablet 2  . aspirin EC 81 MG tablet Take 81 mg by mouth daily.    . calcium carbonate (CALCIUM 600) 600 MG TABS tablet Take 600 mg by mouth daily with breakfast.    . Cyanocobalamin (B-12) 2500 MCG TABS Take 1 tablet by mouth daily.    . DULoxetine (CYMBALTA) 60 MG capsule Take 60 mg by mouth daily.     . furosemide (LASIX) 20 MG tablet Take 20 mg by mouth daily.    . hydrALAZINE (APRESOLINE) 100 MG tablet Take 1 tablet (100 mg total) by mouth 3 (three) times daily.    . HYDROcodone-acetaminophen (NORCO/VICODIN) 5-325 MG tablet Take 1 tablet by mouth 3 (three) times daily.  0  . labetalol (NORMODYNE) 200 MG tablet Take 1 tablet (200 mg total) by mouth 2 (two) times daily. 60 tablet 3  . LORazepam (ATIVAN) 1 MG tablet Take 1 mg by mouth 2 (two) times daily.  2  . nitroGLYCERIN (NITROSTAT) 0.4 MG SL tablet Place 0.4 mg under the tongue every 5 (five) minutes as needed for chest pain.    . pantoprazole (PROTONIX) 40 MG tablet 1 po 30 mins prior to first meal (Patient taking differently: Take 40 mg by mouth daily. 1 po 30 mins prior to first meal) 30 tablet 11  . pravastatin (PRAVACHOL) 40 MG tablet Take 1 tablet (40 mg total) by mouth every evening. 90 tablet 1  . tiZANidine (ZANAFLEX) 4 MG tablet Take 1 tablet by mouth 2 (two) times daily.    . traZODone (DESYREL) 50 MG tablet  Take 50 mg by mouth at bedtime as needed for sleep.    . vitamin C (ASCORBIC ACID) 500 MG tablet Take 500 mg by mouth daily.     No current facility-administered medications for this visit.      Past Surgical History:  Procedure Laterality Date  . BACK SURGERY    . BIOPSY  12/08/2017   Procedure: BIOPSY;  Surgeon: Fields, Sandi L, MD;  Location: [facility];  Service: Endoscopy;;  gastric  . BONE MARROW BIOPSY     x 3 times  . CARDIAC CATHETERIZATION N/A 08/12/2016   Procedure: Left Heart Cath and Coronary Angiography;  Surgeon: Jayadeep S Varanasi, MD;  Location: [facility] INVASIVE   CV LAB;  Service: Cardiovascular;  Laterality: N/A;  . CARDIAC CATHETERIZATION N/A 08/12/2016   Procedure: Coronary Stent Intervention;  Surgeon: Jettie Booze, MD;  Location: [facility] CV LAB;  Service: Cardiovascular;  Laterality: N/A;  . CARDIAC CATHETERIZATION  2000s X 1  . COLONOSCOPY WITH PROPOFOL N/A 02/01/2013   SLF: 1. 23 colon polyps removed. 2 retrieved. 2. Mild diverticulosis in teh sigmoid colon 3. Small internal hemorrhoids 4. The colon is redundant   . COLONOSCOPY WITH PROPOFOL N/A 12/08/2017   Procedure: COLONOSCOPY WITH PROPOFOL;  Surgeon: Danie Binder, MD;  Location: [facility];  Service: Endoscopy;  Laterality: N/A;  1:45pm  . CYSTOSCOPY W/ STONE MANIPULATION    . ESOPHAGOGASTRODUODENOSCOPY (EGD) WITH PROPOFOL N/A 02/01/2013   SLF: 1. Moderate erosive gastritis  . ESOPHAGOGASTRODUODENOSCOPY (EGD) WITH PROPOFOL N/A 12/08/2017   Procedure: ESOPHAGOGASTRODUODENOSCOPY (EGD) WITH PROPOFOL;  Surgeon: Danie Binder, MD;  Location: [facility];  Service: Endoscopy;  Laterality: N/A;  . LUMBAR [place]  . POLYPECTOMY N/A 02/01/2013   Procedure: POLYPECTOMY;  Surgeon: Danie Binder, MD;  Location: [facility] ORS;  Service: Endoscopy;  Laterality: N/A;     Allergies  Allergen Reactions  . Hydrocodone Itching      Family History  Problem Relation Age of Onset  . Heart attack  Mother   . CVA Mother   . Heart attack Father   . Colon cancer Neg Hx      Social History [name] reports that he quit smoking about 5 years ago. His smoking use included cigars. He started smoking about 43 years ago. He has a 20.00 pack-year smoking history. He has never used smokeless tobacco. [name] reports current alcohol use.   Review of Systems CONSTITUTIONAL: +fatigue HEENT: Eyes: No visual loss, blurred vision, double vision or yellow sclerae.No hearing loss, sneezing, congestion, runny nose or sore throat.  SKIN: No rash or itching.  CARDIOVASCULAR: per hpi RESPIRATORY: No shortness of breath, cough or sputum.  GASTROINTESTINAL: No anorexia, nausea, vomiting or diarrhea. No abdominal pain or blood.  GENITOURINARY: No burning on urination, no polyuria NEUROLOGICAL: No headache, dizziness, syncope, paralysis, ataxia, numbness or tingling in the extremities. No change in bowel or bladder control.  MUSCULOSKELETAL: No muscle, back pain, joint pain or stiffness.  LYMPHATICS: No enlarged nodes. No history of splenectomy.  PSYCHIATRIC: No history of depression or anxiety.  ENDOCRINOLOGIC: No reports of sweating, cold or heat intolerance. No polyuria or polydipsia.  Marland Kitchen   Physical Examination Today's Vitals   10/04/18 1535  BP: (!) 157/74  Pulse: 81  SpO2: 97%  Weight: 190 lb (86.2 kg)  Height: 6' (1.829 m)   Body mass index is 25.77 kg/m.  Gen: resting comfortably, no acute distress HEENT: no scleral icterus, pupils equal round and reactive, no palptable cervical adenopathy,  CV: RRR, no m/r/g, no jvd Resp: Clear to auscultation bilaterally GI: abdomen is soft, non-tender, non-distended, normal bowel sounds, no hepatosplenomegaly MSK: extremities are warm,1+ bilateral LE edema Skin: warm, no rash Neuro:  no focal deficits Psych: appropriate affect   Diagnostic Studies  Jan 2018 cath  Dist RCA lesion, 60 %stenosed.  3rd Mrg lesion, 90 %stenosed. This  was a small vessel.  Mid LAD lesion, 40 %stenosed.  Ost LAD lesion, 25 %stenosed.  Mid Cx lesion, 95 %stenosed. This was the culprit lesion. A STENT SYNERGY DES 3X20 drug eluting stent was successfully placed., postdilated to 3.5  Post intervention, there is a 0% residual stenosis.  LV end diastolic pressure is normal.  There is no aortic valve stenosis.  Significant tortuosity in the right subclavian. If emergency cath was needed in the future, would not use right radial approach.  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 A Synergy stent was used to give him the benefit of a drug-eluting stent but also allow for shortening of antiplatelet therapy duration. For now, continue dual antiplatelet therapy. His platelet count is normal. I will speak to Dr. Branch regarding this issue.  08/2018 echo SUMMARY The left ventricular size is normal. There is normal left ventricular wall thickness.  Left ventricular systolic function is normal. LV ejection fraction = 60-65%.  Left ventricular filling pattern is prolonged relaxation. No segmental wall motion abnormalities seen in the left ventricle The right ventricle is normal in size and function. The left atrium is moderately dilated. There is mild aortic regurgitation. There is mild to moderate tricuspid regurgitation. Moderate pulmonary hypertension. Estimated right ventricular systolic pressure is 53 mmHg. There is no pericardial effusion. There is no comparison study available.   Assessment and Plan  1.CAD - recent chest pain symptoms fairly atypical. He does have some residual small vessel disease and modrate RCA disease by cath. He has not been taking his beta blocker at home for unclear reasons. Restart labetalol, follow symptoms. With his renal function would have fairly high threshold to consider cath. Could consider noninvasive stress imaging to risk stratify if symptoms don't improve initially with medical therapy.     2. HTN - he has much confusion about his meds. He is not taking his labetalol, remains on his hydralazine though this was to be held after his recent discharge - we will restart his labetalol 200mg bid. Continue hydralazine for now, if renal would like to stop ok with us.    3. Acute on chronic diastolic HF - evidence of volume overload. Repeat echo at [facility] shows just mild diastolic dysfunction. Suspect his renal dysfunction is playing some role as well - followed closely by nephrology at [facility] with recent diuretic changes, defer diuretic management to nephrology.   4. Hyperlipidemia - why and when his statin is stopped is unclear. I suspect it was stopped during the times he was having issues with abdominal pain.He was to try low dose pravastatin but does not have with his pill bottles today, he is unsure if he ever took - will write new Rx for pravastatin 20mg daily.   F/u 6 weeks  Jonathan F. Branch, M.D. 

## 2018-10-05 DIAGNOSIS — I130 Hypertensive heart and chronic kidney disease with heart failure and stage 1 through stage 4 chronic kidney disease, or unspecified chronic kidney disease: Secondary | ICD-10-CM | POA: Diagnosis not present

## 2018-10-05 DIAGNOSIS — I5030 Unspecified diastolic (congestive) heart failure: Secondary | ICD-10-CM | POA: Diagnosis not present

## 2018-10-05 DIAGNOSIS — N179 Acute kidney failure, unspecified: Secondary | ICD-10-CM | POA: Diagnosis not present

## 2018-10-05 DIAGNOSIS — J189 Pneumonia, unspecified organism: Secondary | ICD-10-CM | POA: Diagnosis not present

## 2018-10-05 DIAGNOSIS — Z87891 Personal history of nicotine dependence: Secondary | ICD-10-CM | POA: Diagnosis not present

## 2018-10-05 DIAGNOSIS — Z955 Presence of coronary angioplasty implant and graft: Secondary | ICD-10-CM | POA: Diagnosis not present

## 2018-10-05 DIAGNOSIS — Z79899 Other long term (current) drug therapy: Secondary | ICD-10-CM | POA: Diagnosis not present

## 2018-10-05 DIAGNOSIS — F329 Major depressive disorder, single episode, unspecified: Secondary | ICD-10-CM | POA: Diagnosis not present

## 2018-10-05 DIAGNOSIS — D469 Myelodysplastic syndrome, unspecified: Secondary | ICD-10-CM | POA: Diagnosis not present

## 2018-10-05 DIAGNOSIS — D72829 Elevated white blood cell count, unspecified: Secondary | ICD-10-CM | POA: Diagnosis not present

## 2018-10-05 DIAGNOSIS — Z87442 Personal history of urinary calculi: Secondary | ICD-10-CM | POA: Diagnosis not present

## 2018-10-05 DIAGNOSIS — F419 Anxiety disorder, unspecified: Secondary | ICD-10-CM | POA: Diagnosis not present

## 2018-10-05 DIAGNOSIS — E1122 Type 2 diabetes mellitus with diabetic chronic kidney disease: Secondary | ICD-10-CM | POA: Diagnosis not present

## 2018-10-05 DIAGNOSIS — N184 Chronic kidney disease, stage 4 (severe): Secondary | ICD-10-CM | POA: Diagnosis not present

## 2018-10-08 DIAGNOSIS — D471 Chronic myeloproliferative disease: Secondary | ICD-10-CM | POA: Diagnosis not present

## 2018-10-08 DIAGNOSIS — Z6828 Body mass index (BMI) 28.0-28.9, adult: Secondary | ICD-10-CM | POA: Diagnosis not present

## 2018-10-08 DIAGNOSIS — I10 Essential (primary) hypertension: Secondary | ICD-10-CM | POA: Diagnosis not present

## 2018-10-08 DIAGNOSIS — N183 Chronic kidney disease, stage 3 (moderate): Secondary | ICD-10-CM | POA: Diagnosis not present

## 2018-10-08 DIAGNOSIS — E1165 Type 2 diabetes mellitus with hyperglycemia: Secondary | ICD-10-CM | POA: Diagnosis not present

## 2018-10-08 DIAGNOSIS — Z299 Encounter for prophylactic measures, unspecified: Secondary | ICD-10-CM | POA: Diagnosis not present

## 2018-10-08 DIAGNOSIS — E1122 Type 2 diabetes mellitus with diabetic chronic kidney disease: Secondary | ICD-10-CM | POA: Diagnosis not present

## 2018-10-20 ENCOUNTER — Telehealth: Payer: Self-pay | Admitting: Cardiology

## 2018-10-20 MED ORDER — HYDRALAZINE HCL 50 MG PO TABS: 100.0000 mg | ORAL_TABLET | Freq: Three times a day (TID) | ORAL | 3 refills | Status: DC

## 2018-10-20 NOTE — Telephone Encounter (Signed)
°*  STAT* If patient is at the pharmacy, call can be transferred to refill team.   1. Which medications need to be refilled? (please list name of each medication and dose if known) Hydralazine 50mg   2. Which pharmacy/location (including street and city if local pharmacy) is medication to be sent to? [place]  3. Do they need a 30 day or 90 day supply? 90 day supply

## 2018-10-20 NOTE — Telephone Encounter (Signed)
Refill complete 

## 2018-10-28 ENCOUNTER — Telehealth: Payer: Self-pay | Admitting: *Deleted

## 2018-10-28 NOTE — Telephone Encounter (Signed)
Pt verbalized consent for telehealth appt tomorrow with Dr Harl Bowie and that insurance would be billed for the encounter. Reviewed pt medications/allergies/pharmacy. Pt will have BP/HR/Weight available.

## 2018-10-29 ENCOUNTER — Telehealth: Payer: Medicare Other | Admitting: Cardiology

## 2018-11-01 NOTE — Progress Notes (Unsigned)
No show, encounter and note opened in error   Zandra Abts MD

## 2018-11-02 DIAGNOSIS — Z95818 Presence of other cardiac implants and grafts: Secondary | ICD-10-CM | POA: Diagnosis not present

## 2018-11-02 DIAGNOSIS — N184 Chronic kidney disease, stage 4 (severe): Secondary | ICD-10-CM | POA: Diagnosis not present

## 2018-11-02 DIAGNOSIS — F418 Other specified anxiety disorders: Secondary | ICD-10-CM | POA: Diagnosis not present

## 2018-11-02 DIAGNOSIS — R0602 Shortness of breath: Secondary | ICD-10-CM | POA: Diagnosis not present

## 2018-11-02 DIAGNOSIS — R162 Hepatomegaly with splenomegaly, not elsewhere classified: Secondary | ICD-10-CM | POA: Diagnosis not present

## 2018-11-02 DIAGNOSIS — R918 Other nonspecific abnormal finding of lung field: Secondary | ICD-10-CM | POA: Diagnosis not present

## 2018-11-02 DIAGNOSIS — D469 Myelodysplastic syndrome, unspecified: Secondary | ICD-10-CM | POA: Diagnosis not present

## 2018-11-02 DIAGNOSIS — R161 Splenomegaly, not elsewhere classified: Secondary | ICD-10-CM | POA: Diagnosis not present

## 2018-11-02 DIAGNOSIS — Z79899 Other long term (current) drug therapy: Secondary | ICD-10-CM | POA: Diagnosis not present

## 2018-11-02 DIAGNOSIS — R188 Other ascites: Secondary | ICD-10-CM | POA: Diagnosis not present

## 2018-11-02 DIAGNOSIS — Z87442 Personal history of urinary calculi: Secondary | ICD-10-CM | POA: Diagnosis not present

## 2018-11-02 DIAGNOSIS — I2589 Other forms of chronic ischemic heart disease: Secondary | ICD-10-CM | POA: Diagnosis not present

## 2018-11-02 DIAGNOSIS — I509 Heart failure, unspecified: Secondary | ICD-10-CM | POA: Diagnosis not present

## 2018-11-02 DIAGNOSIS — Z87891 Personal history of nicotine dependence: Secondary | ICD-10-CM | POA: Diagnosis not present

## 2018-11-02 DIAGNOSIS — M5136 Other intervertebral disc degeneration, lumbar region: Secondary | ICD-10-CM | POA: Diagnosis not present

## 2018-11-02 DIAGNOSIS — F329 Major depressive disorder, single episode, unspecified: Secondary | ICD-10-CM | POA: Diagnosis not present

## 2018-11-02 DIAGNOSIS — F419 Anxiety disorder, unspecified: Secondary | ICD-10-CM | POA: Diagnosis not present

## 2018-11-02 DIAGNOSIS — E790 Hyperuricemia without signs of inflammatory arthritis and tophaceous disease: Secondary | ICD-10-CM | POA: Diagnosis not present

## 2018-11-02 DIAGNOSIS — J189 Pneumonia, unspecified organism: Secondary | ICD-10-CM | POA: Diagnosis not present

## 2018-11-02 DIAGNOSIS — I130 Hypertensive heart and chronic kidney disease with heart failure and stage 1 through stage 4 chronic kidney disease, or unspecified chronic kidney disease: Secondary | ICD-10-CM | POA: Diagnosis not present

## 2018-11-02 DIAGNOSIS — R1011 Right upper quadrant pain: Secondary | ICD-10-CM | POA: Diagnosis not present

## 2018-11-02 DIAGNOSIS — I10 Essential (primary) hypertension: Secondary | ICD-10-CM | POA: Diagnosis not present

## 2018-11-08 DIAGNOSIS — I10 Essential (primary) hypertension: Secondary | ICD-10-CM | POA: Diagnosis not present

## 2018-11-08 DIAGNOSIS — Z6828 Body mass index (BMI) 28.0-28.9, adult: Secondary | ICD-10-CM | POA: Diagnosis not present

## 2018-11-08 DIAGNOSIS — N183 Chronic kidney disease, stage 3 (moderate): Secondary | ICD-10-CM | POA: Diagnosis not present

## 2018-11-08 DIAGNOSIS — R350 Frequency of micturition: Secondary | ICD-10-CM | POA: Diagnosis not present

## 2018-11-08 DIAGNOSIS — J189 Pneumonia, unspecified organism: Secondary | ICD-10-CM | POA: Diagnosis not present

## 2018-11-08 DIAGNOSIS — Z299 Encounter for prophylactic measures, unspecified: Secondary | ICD-10-CM | POA: Diagnosis not present

## 2018-11-08 DIAGNOSIS — M549 Dorsalgia, unspecified: Secondary | ICD-10-CM | POA: Diagnosis not present

## 2018-11-22 DIAGNOSIS — N183 Chronic kidney disease, stage 3 (moderate): Secondary | ICD-10-CM | POA: Diagnosis not present

## 2018-11-24 DIAGNOSIS — Z1339 Encounter for screening examination for other mental health and behavioral disorders: Secondary | ICD-10-CM | POA: Diagnosis not present

## 2018-11-24 DIAGNOSIS — Z0000 Encounter for general adult medical examination without abnormal findings: Secondary | ICD-10-CM | POA: Diagnosis not present

## 2018-11-24 DIAGNOSIS — R5383 Other fatigue: Secondary | ICD-10-CM | POA: Diagnosis not present

## 2018-11-24 DIAGNOSIS — Z1211 Encounter for screening for malignant neoplasm of colon: Secondary | ICD-10-CM | POA: Diagnosis not present

## 2018-11-24 DIAGNOSIS — E785 Hyperlipidemia, unspecified: Secondary | ICD-10-CM | POA: Diagnosis not present

## 2018-11-24 DIAGNOSIS — Z6826 Body mass index (BMI) 26.0-26.9, adult: Secondary | ICD-10-CM | POA: Diagnosis not present

## 2018-11-24 DIAGNOSIS — Z7189 Other specified counseling: Secondary | ICD-10-CM | POA: Diagnosis not present

## 2018-11-24 DIAGNOSIS — F419 Anxiety disorder, unspecified: Secondary | ICD-10-CM | POA: Diagnosis not present

## 2018-11-24 DIAGNOSIS — Z1331 Encounter for screening for depression: Secondary | ICD-10-CM | POA: Diagnosis not present

## 2018-11-24 DIAGNOSIS — Z299 Encounter for prophylactic measures, unspecified: Secondary | ICD-10-CM | POA: Diagnosis not present

## 2018-11-30 DIAGNOSIS — I10 Essential (primary) hypertension: Secondary | ICD-10-CM | POA: Diagnosis not present

## 2018-11-30 DIAGNOSIS — Z95828 Presence of other vascular implants and grafts: Secondary | ICD-10-CM | POA: Diagnosis not present

## 2018-11-30 DIAGNOSIS — F418 Other specified anxiety disorders: Secondary | ICD-10-CM | POA: Diagnosis not present

## 2018-11-30 DIAGNOSIS — N058 Unspecified nephritic syndrome with other morphologic changes: Secondary | ICD-10-CM | POA: Diagnosis not present

## 2018-11-30 DIAGNOSIS — Z87891 Personal history of nicotine dependence: Secondary | ICD-10-CM | POA: Diagnosis not present

## 2018-11-30 DIAGNOSIS — D469 Myelodysplastic syndrome, unspecified: Secondary | ICD-10-CM | POA: Diagnosis not present

## 2018-11-30 DIAGNOSIS — D72829 Elevated white blood cell count, unspecified: Secondary | ICD-10-CM | POA: Diagnosis not present

## 2018-12-06 DIAGNOSIS — E1122 Type 2 diabetes mellitus with diabetic chronic kidney disease: Secondary | ICD-10-CM | POA: Diagnosis present

## 2018-12-06 DIAGNOSIS — Z8249 Family history of ischemic heart disease and other diseases of the circulatory system: Secondary | ICD-10-CM | POA: Diagnosis not present

## 2018-12-06 DIAGNOSIS — N186 End stage renal disease: Secondary | ICD-10-CM | POA: Diagnosis not present

## 2018-12-06 DIAGNOSIS — D46Z Other myelodysplastic syndromes: Secondary | ICD-10-CM | POA: Diagnosis not present

## 2018-12-06 DIAGNOSIS — I130 Hypertensive heart and chronic kidney disease with heart failure and stage 1 through stage 4 chronic kidney disease, or unspecified chronic kidney disease: Secondary | ICD-10-CM | POA: Diagnosis not present

## 2018-12-06 DIAGNOSIS — D631 Anemia in chronic kidney disease: Secondary | ICD-10-CM | POA: Diagnosis present

## 2018-12-06 DIAGNOSIS — R079 Chest pain, unspecified: Secondary | ICD-10-CM | POA: Diagnosis not present

## 2018-12-06 DIAGNOSIS — R0789 Other chest pain: Secondary | ICD-10-CM | POA: Diagnosis not present

## 2018-12-06 DIAGNOSIS — F419 Anxiety disorder, unspecified: Secondary | ICD-10-CM | POA: Diagnosis present

## 2018-12-06 DIAGNOSIS — E8809 Other disorders of plasma-protein metabolism, not elsewhere classified: Secondary | ICD-10-CM | POA: Diagnosis not present

## 2018-12-06 DIAGNOSIS — N058 Unspecified nephritic syndrome with other morphologic changes: Secondary | ICD-10-CM | POA: Diagnosis present

## 2018-12-06 DIAGNOSIS — D696 Thrombocytopenia, unspecified: Secondary | ICD-10-CM | POA: Diagnosis not present

## 2018-12-06 DIAGNOSIS — R162 Hepatomegaly with splenomegaly, not elsewhere classified: Secondary | ICD-10-CM | POA: Diagnosis present

## 2018-12-06 DIAGNOSIS — E785 Hyperlipidemia, unspecified: Secondary | ICD-10-CM | POA: Diagnosis present

## 2018-12-06 DIAGNOSIS — I132 Hypertensive heart and chronic kidney disease with heart failure and with stage 5 chronic kidney disease, or end stage renal disease: Secondary | ICD-10-CM | POA: Diagnosis not present

## 2018-12-06 DIAGNOSIS — I5032 Chronic diastolic (congestive) heart failure: Secondary | ICD-10-CM | POA: Diagnosis not present

## 2018-12-06 DIAGNOSIS — F329 Major depressive disorder, single episode, unspecified: Secondary | ICD-10-CM | POA: Diagnosis present

## 2018-12-06 DIAGNOSIS — K219 Gastro-esophageal reflux disease without esophagitis: Secondary | ICD-10-CM | POA: Diagnosis present

## 2018-12-06 DIAGNOSIS — I5030 Unspecified diastolic (congestive) heart failure: Secondary | ICD-10-CM | POA: Diagnosis not present

## 2018-12-06 DIAGNOSIS — I5033 Acute on chronic diastolic (congestive) heart failure: Secondary | ICD-10-CM | POA: Diagnosis not present

## 2018-12-06 DIAGNOSIS — N189 Chronic kidney disease, unspecified: Secondary | ICD-10-CM | POA: Diagnosis not present

## 2018-12-06 DIAGNOSIS — Z7982 Long term (current) use of aspirin: Secondary | ICD-10-CM | POA: Diagnosis not present

## 2018-12-06 DIAGNOSIS — J849 Interstitial pulmonary disease, unspecified: Secondary | ICD-10-CM | POA: Diagnosis present

## 2018-12-06 DIAGNOSIS — Z955 Presence of coronary angioplasty implant and graft: Secondary | ICD-10-CM | POA: Diagnosis not present

## 2018-12-06 DIAGNOSIS — E8770 Fluid overload, unspecified: Secondary | ICD-10-CM | POA: Diagnosis not present

## 2018-12-06 DIAGNOSIS — M351 Other overlap syndromes: Secondary | ICD-10-CM | POA: Diagnosis present

## 2018-12-06 DIAGNOSIS — D469 Myelodysplastic syndrome, unspecified: Secondary | ICD-10-CM | POA: Diagnosis present

## 2018-12-06 DIAGNOSIS — Z992 Dependence on renal dialysis: Secondary | ICD-10-CM | POA: Diagnosis not present

## 2018-12-06 DIAGNOSIS — R0602 Shortness of breath: Secondary | ICD-10-CM | POA: Diagnosis not present

## 2018-12-06 DIAGNOSIS — N183 Chronic kidney disease, stage 3 (moderate): Secondary | ICD-10-CM | POA: Diagnosis not present

## 2018-12-06 DIAGNOSIS — J90 Pleural effusion, not elsewhere classified: Secondary | ICD-10-CM | POA: Diagnosis present

## 2018-12-06 DIAGNOSIS — R918 Other nonspecific abnormal finding of lung field: Secondary | ICD-10-CM | POA: Diagnosis not present

## 2018-12-06 DIAGNOSIS — N184 Chronic kidney disease, stage 4 (severe): Secondary | ICD-10-CM | POA: Diagnosis not present

## 2018-12-06 DIAGNOSIS — R846 Abnormal cytological findings in specimens from respiratory organs and thorax: Secondary | ICD-10-CM | POA: Diagnosis not present

## 2018-12-06 DIAGNOSIS — J810 Acute pulmonary edema: Secondary | ICD-10-CM | POA: Diagnosis not present

## 2018-12-06 DIAGNOSIS — I2510 Atherosclerotic heart disease of native coronary artery without angina pectoris: Secondary | ICD-10-CM | POA: Diagnosis present

## 2018-12-06 DIAGNOSIS — D649 Anemia, unspecified: Secondary | ICD-10-CM | POA: Diagnosis not present

## 2018-12-06 DIAGNOSIS — Z87891 Personal history of nicotine dependence: Secondary | ICD-10-CM | POA: Diagnosis not present

## 2018-12-06 DIAGNOSIS — N179 Acute kidney failure, unspecified: Secondary | ICD-10-CM | POA: Diagnosis present

## 2018-12-17 DIAGNOSIS — Z79899 Other long term (current) drug therapy: Secondary | ICD-10-CM | POA: Diagnosis not present

## 2018-12-17 DIAGNOSIS — D631 Anemia in chronic kidney disease: Secondary | ICD-10-CM | POA: Diagnosis not present

## 2018-12-17 DIAGNOSIS — E1129 Type 2 diabetes mellitus with other diabetic kidney complication: Secondary | ICD-10-CM | POA: Diagnosis not present

## 2018-12-17 DIAGNOSIS — I259 Chronic ischemic heart disease, unspecified: Secondary | ICD-10-CM | POA: Diagnosis not present

## 2018-12-17 DIAGNOSIS — N2581 Secondary hyperparathyroidism of renal origin: Secondary | ICD-10-CM | POA: Diagnosis not present

## 2018-12-17 DIAGNOSIS — N186 End stage renal disease: Secondary | ICD-10-CM | POA: Diagnosis not present

## 2018-12-17 DIAGNOSIS — I509 Heart failure, unspecified: Secondary | ICD-10-CM | POA: Diagnosis not present

## 2018-12-17 DIAGNOSIS — E119 Type 2 diabetes mellitus without complications: Secondary | ICD-10-CM | POA: Diagnosis not present

## 2018-12-17 DIAGNOSIS — Z992 Dependence on renal dialysis: Secondary | ICD-10-CM | POA: Diagnosis not present

## 2018-12-17 DIAGNOSIS — D471 Chronic myeloproliferative disease: Secondary | ICD-10-CM | POA: Diagnosis not present

## 2018-12-20 DIAGNOSIS — Z23 Encounter for immunization: Secondary | ICD-10-CM | POA: Diagnosis not present

## 2018-12-20 DIAGNOSIS — N186 End stage renal disease: Secondary | ICD-10-CM | POA: Diagnosis not present

## 2018-12-20 DIAGNOSIS — E559 Vitamin D deficiency, unspecified: Secondary | ICD-10-CM | POA: Diagnosis not present

## 2018-12-20 DIAGNOSIS — Z992 Dependence on renal dialysis: Secondary | ICD-10-CM | POA: Diagnosis not present

## 2018-12-20 DIAGNOSIS — D509 Iron deficiency anemia, unspecified: Secondary | ICD-10-CM | POA: Diagnosis not present

## 2018-12-21 DIAGNOSIS — E1122 Type 2 diabetes mellitus with diabetic chronic kidney disease: Secondary | ICD-10-CM | POA: Diagnosis not present

## 2018-12-21 DIAGNOSIS — N183 Chronic kidney disease, stage 3 (moderate): Secondary | ICD-10-CM | POA: Diagnosis not present

## 2018-12-21 DIAGNOSIS — I10 Essential (primary) hypertension: Secondary | ICD-10-CM | POA: Diagnosis not present

## 2018-12-21 DIAGNOSIS — Z299 Encounter for prophylactic measures, unspecified: Secondary | ICD-10-CM | POA: Diagnosis not present

## 2018-12-21 DIAGNOSIS — Z6825 Body mass index (BMI) 25.0-25.9, adult: Secondary | ICD-10-CM | POA: Diagnosis not present

## 2018-12-21 DIAGNOSIS — C9590 Leukemia, unspecified not having achieved remission: Secondary | ICD-10-CM | POA: Diagnosis not present

## 2018-12-22 DIAGNOSIS — Z992 Dependence on renal dialysis: Secondary | ICD-10-CM | POA: Diagnosis not present

## 2018-12-22 DIAGNOSIS — E559 Vitamin D deficiency, unspecified: Secondary | ICD-10-CM | POA: Diagnosis not present

## 2018-12-22 DIAGNOSIS — N186 End stage renal disease: Secondary | ICD-10-CM | POA: Diagnosis not present

## 2018-12-22 DIAGNOSIS — Z23 Encounter for immunization: Secondary | ICD-10-CM | POA: Diagnosis not present

## 2018-12-22 DIAGNOSIS — D509 Iron deficiency anemia, unspecified: Secondary | ICD-10-CM | POA: Diagnosis not present

## 2018-12-24 DIAGNOSIS — Z992 Dependence on renal dialysis: Secondary | ICD-10-CM | POA: Diagnosis not present

## 2018-12-24 DIAGNOSIS — Z23 Encounter for immunization: Secondary | ICD-10-CM | POA: Diagnosis not present

## 2018-12-24 DIAGNOSIS — N186 End stage renal disease: Secondary | ICD-10-CM | POA: Diagnosis not present

## 2018-12-24 DIAGNOSIS — E559 Vitamin D deficiency, unspecified: Secondary | ICD-10-CM | POA: Diagnosis not present

## 2018-12-24 DIAGNOSIS — D509 Iron deficiency anemia, unspecified: Secondary | ICD-10-CM | POA: Diagnosis not present

## 2018-12-27 DIAGNOSIS — Z23 Encounter for immunization: Secondary | ICD-10-CM | POA: Diagnosis not present

## 2018-12-27 DIAGNOSIS — N186 End stage renal disease: Secondary | ICD-10-CM | POA: Diagnosis not present

## 2018-12-27 DIAGNOSIS — E559 Vitamin D deficiency, unspecified: Secondary | ICD-10-CM | POA: Diagnosis not present

## 2018-12-27 DIAGNOSIS — Z992 Dependence on renal dialysis: Secondary | ICD-10-CM | POA: Diagnosis not present

## 2018-12-27 DIAGNOSIS — D509 Iron deficiency anemia, unspecified: Secondary | ICD-10-CM | POA: Diagnosis not present

## 2018-12-28 DIAGNOSIS — Z95828 Presence of other vascular implants and grafts: Secondary | ICD-10-CM | POA: Diagnosis not present

## 2018-12-28 DIAGNOSIS — D469 Myelodysplastic syndrome, unspecified: Secondary | ICD-10-CM | POA: Diagnosis not present

## 2018-12-28 DIAGNOSIS — F418 Other specified anxiety disorders: Secondary | ICD-10-CM | POA: Diagnosis not present

## 2018-12-28 DIAGNOSIS — D72829 Elevated white blood cell count, unspecified: Secondary | ICD-10-CM | POA: Diagnosis not present

## 2018-12-28 DIAGNOSIS — I509 Heart failure, unspecified: Secondary | ICD-10-CM | POA: Diagnosis not present

## 2018-12-28 DIAGNOSIS — R109 Unspecified abdominal pain: Secondary | ICD-10-CM | POA: Diagnosis not present

## 2018-12-28 DIAGNOSIS — I110 Hypertensive heart disease with heart failure: Secondary | ICD-10-CM | POA: Diagnosis not present

## 2018-12-29 DIAGNOSIS — N186 End stage renal disease: Secondary | ICD-10-CM | POA: Diagnosis not present

## 2018-12-29 DIAGNOSIS — D509 Iron deficiency anemia, unspecified: Secondary | ICD-10-CM | POA: Diagnosis not present

## 2018-12-29 DIAGNOSIS — E559 Vitamin D deficiency, unspecified: Secondary | ICD-10-CM | POA: Diagnosis not present

## 2018-12-29 DIAGNOSIS — Z23 Encounter for immunization: Secondary | ICD-10-CM | POA: Diagnosis not present

## 2018-12-29 DIAGNOSIS — Z992 Dependence on renal dialysis: Secondary | ICD-10-CM | POA: Diagnosis not present

## 2018-12-31 DIAGNOSIS — Z992 Dependence on renal dialysis: Secondary | ICD-10-CM | POA: Diagnosis not present

## 2018-12-31 DIAGNOSIS — D509 Iron deficiency anemia, unspecified: Secondary | ICD-10-CM | POA: Diagnosis not present

## 2018-12-31 DIAGNOSIS — Z23 Encounter for immunization: Secondary | ICD-10-CM | POA: Diagnosis not present

## 2018-12-31 DIAGNOSIS — N186 End stage renal disease: Secondary | ICD-10-CM | POA: Diagnosis not present

## 2018-12-31 DIAGNOSIS — E559 Vitamin D deficiency, unspecified: Secondary | ICD-10-CM | POA: Diagnosis not present

## 2019-01-03 DIAGNOSIS — N186 End stage renal disease: Secondary | ICD-10-CM | POA: Diagnosis not present

## 2019-01-03 DIAGNOSIS — E559 Vitamin D deficiency, unspecified: Secondary | ICD-10-CM | POA: Diagnosis not present

## 2019-01-03 DIAGNOSIS — Z23 Encounter for immunization: Secondary | ICD-10-CM | POA: Diagnosis not present

## 2019-01-03 DIAGNOSIS — D509 Iron deficiency anemia, unspecified: Secondary | ICD-10-CM | POA: Diagnosis not present

## 2019-01-03 DIAGNOSIS — Z992 Dependence on renal dialysis: Secondary | ICD-10-CM | POA: Diagnosis not present

## 2019-01-05 ENCOUNTER — Other Ambulatory Visit (HOSPITAL_COMMUNITY): Payer: Medicare Other

## 2019-01-05 ENCOUNTER — Encounter (HOSPITAL_COMMUNITY): Payer: Medicare Other

## 2019-01-05 ENCOUNTER — Encounter: Payer: Medicare Other | Admitting: Vascular Surgery

## 2019-01-05 DIAGNOSIS — Z992 Dependence on renal dialysis: Secondary | ICD-10-CM | POA: Diagnosis not present

## 2019-01-05 DIAGNOSIS — D509 Iron deficiency anemia, unspecified: Secondary | ICD-10-CM | POA: Diagnosis not present

## 2019-01-05 DIAGNOSIS — Z23 Encounter for immunization: Secondary | ICD-10-CM | POA: Diagnosis not present

## 2019-01-05 DIAGNOSIS — N186 End stage renal disease: Secondary | ICD-10-CM | POA: Diagnosis not present

## 2019-01-05 DIAGNOSIS — E559 Vitamin D deficiency, unspecified: Secondary | ICD-10-CM | POA: Diagnosis not present

## 2019-01-07 DIAGNOSIS — D509 Iron deficiency anemia, unspecified: Secondary | ICD-10-CM | POA: Diagnosis not present

## 2019-01-07 DIAGNOSIS — E559 Vitamin D deficiency, unspecified: Secondary | ICD-10-CM | POA: Diagnosis not present

## 2019-01-07 DIAGNOSIS — Z992 Dependence on renal dialysis: Secondary | ICD-10-CM | POA: Diagnosis not present

## 2019-01-07 DIAGNOSIS — Z23 Encounter for immunization: Secondary | ICD-10-CM | POA: Diagnosis not present

## 2019-01-07 DIAGNOSIS — N186 End stage renal disease: Secondary | ICD-10-CM | POA: Diagnosis not present

## 2019-01-10 DIAGNOSIS — E559 Vitamin D deficiency, unspecified: Secondary | ICD-10-CM | POA: Diagnosis not present

## 2019-01-10 DIAGNOSIS — Z23 Encounter for immunization: Secondary | ICD-10-CM | POA: Diagnosis not present

## 2019-01-10 DIAGNOSIS — Z992 Dependence on renal dialysis: Secondary | ICD-10-CM | POA: Diagnosis not present

## 2019-01-10 DIAGNOSIS — N186 End stage renal disease: Secondary | ICD-10-CM | POA: Diagnosis not present

## 2019-01-10 DIAGNOSIS — D509 Iron deficiency anemia, unspecified: Secondary | ICD-10-CM | POA: Diagnosis not present

## 2019-01-12 DIAGNOSIS — D509 Iron deficiency anemia, unspecified: Secondary | ICD-10-CM | POA: Diagnosis not present

## 2019-01-12 DIAGNOSIS — Z23 Encounter for immunization: Secondary | ICD-10-CM | POA: Diagnosis not present

## 2019-01-12 DIAGNOSIS — Z992 Dependence on renal dialysis: Secondary | ICD-10-CM | POA: Diagnosis not present

## 2019-01-12 DIAGNOSIS — N186 End stage renal disease: Secondary | ICD-10-CM | POA: Diagnosis not present

## 2019-01-12 DIAGNOSIS — E559 Vitamin D deficiency, unspecified: Secondary | ICD-10-CM | POA: Diagnosis not present

## 2019-01-14 ENCOUNTER — Other Ambulatory Visit: Payer: Self-pay

## 2019-01-14 DIAGNOSIS — D509 Iron deficiency anemia, unspecified: Secondary | ICD-10-CM | POA: Diagnosis not present

## 2019-01-14 DIAGNOSIS — N184 Chronic kidney disease, stage 4 (severe): Secondary | ICD-10-CM

## 2019-01-14 DIAGNOSIS — N183 Chronic kidney disease, stage 3 unspecified: Secondary | ICD-10-CM

## 2019-01-14 DIAGNOSIS — E559 Vitamin D deficiency, unspecified: Secondary | ICD-10-CM | POA: Diagnosis not present

## 2019-01-14 DIAGNOSIS — Z23 Encounter for immunization: Secondary | ICD-10-CM | POA: Diagnosis not present

## 2019-01-14 DIAGNOSIS — Z992 Dependence on renal dialysis: Secondary | ICD-10-CM | POA: Diagnosis not present

## 2019-01-14 DIAGNOSIS — N186 End stage renal disease: Secondary | ICD-10-CM | POA: Diagnosis not present

## 2019-01-17 DIAGNOSIS — Z992 Dependence on renal dialysis: Secondary | ICD-10-CM | POA: Diagnosis not present

## 2019-01-17 DIAGNOSIS — Z23 Encounter for immunization: Secondary | ICD-10-CM | POA: Diagnosis not present

## 2019-01-17 DIAGNOSIS — D509 Iron deficiency anemia, unspecified: Secondary | ICD-10-CM | POA: Diagnosis not present

## 2019-01-17 DIAGNOSIS — N186 End stage renal disease: Secondary | ICD-10-CM | POA: Diagnosis not present

## 2019-01-17 DIAGNOSIS — E559 Vitamin D deficiency, unspecified: Secondary | ICD-10-CM | POA: Diagnosis not present

## 2019-01-18 ENCOUNTER — Other Ambulatory Visit (HOSPITAL_COMMUNITY): Payer: Medicare Other

## 2019-01-18 ENCOUNTER — Encounter (HOSPITAL_COMMUNITY): Payer: Medicare Other

## 2019-01-18 ENCOUNTER — Encounter: Payer: Self-pay | Admitting: Family

## 2019-01-18 ENCOUNTER — Encounter: Payer: Medicare Other | Admitting: Vascular Surgery

## 2019-01-18 DIAGNOSIS — N186 End stage renal disease: Secondary | ICD-10-CM | POA: Diagnosis not present

## 2019-01-18 DIAGNOSIS — Z992 Dependence on renal dialysis: Secondary | ICD-10-CM | POA: Diagnosis not present

## 2019-01-19 DIAGNOSIS — N186 End stage renal disease: Secondary | ICD-10-CM | POA: Diagnosis not present

## 2019-01-19 DIAGNOSIS — D631 Anemia in chronic kidney disease: Secondary | ICD-10-CM | POA: Diagnosis not present

## 2019-01-19 DIAGNOSIS — D509 Iron deficiency anemia, unspecified: Secondary | ICD-10-CM | POA: Diagnosis not present

## 2019-01-19 DIAGNOSIS — Z992 Dependence on renal dialysis: Secondary | ICD-10-CM | POA: Diagnosis not present

## 2019-01-20 DIAGNOSIS — E1165 Type 2 diabetes mellitus with hyperglycemia: Secondary | ICD-10-CM | POA: Diagnosis not present

## 2019-01-20 DIAGNOSIS — Z299 Encounter for prophylactic measures, unspecified: Secondary | ICD-10-CM | POA: Diagnosis not present

## 2019-01-20 DIAGNOSIS — I10 Essential (primary) hypertension: Secondary | ICD-10-CM | POA: Diagnosis not present

## 2019-01-20 DIAGNOSIS — E1122 Type 2 diabetes mellitus with diabetic chronic kidney disease: Secondary | ICD-10-CM | POA: Diagnosis not present

## 2019-01-20 DIAGNOSIS — C9590 Leukemia, unspecified not having achieved remission: Secondary | ICD-10-CM | POA: Diagnosis not present

## 2019-01-20 DIAGNOSIS — Z992 Dependence on renal dialysis: Secondary | ICD-10-CM | POA: Diagnosis not present

## 2019-01-20 DIAGNOSIS — Z6825 Body mass index (BMI) 25.0-25.9, adult: Secondary | ICD-10-CM | POA: Diagnosis not present

## 2019-01-21 DIAGNOSIS — D631 Anemia in chronic kidney disease: Secondary | ICD-10-CM | POA: Diagnosis not present

## 2019-01-21 DIAGNOSIS — Z992 Dependence on renal dialysis: Secondary | ICD-10-CM | POA: Diagnosis not present

## 2019-01-21 DIAGNOSIS — D509 Iron deficiency anemia, unspecified: Secondary | ICD-10-CM | POA: Diagnosis not present

## 2019-01-21 DIAGNOSIS — N186 End stage renal disease: Secondary | ICD-10-CM | POA: Diagnosis not present

## 2019-01-24 DIAGNOSIS — D509 Iron deficiency anemia, unspecified: Secondary | ICD-10-CM | POA: Diagnosis not present

## 2019-01-24 DIAGNOSIS — D631 Anemia in chronic kidney disease: Secondary | ICD-10-CM | POA: Diagnosis not present

## 2019-01-24 DIAGNOSIS — N186 End stage renal disease: Secondary | ICD-10-CM | POA: Diagnosis not present

## 2019-01-24 DIAGNOSIS — Z992 Dependence on renal dialysis: Secondary | ICD-10-CM | POA: Diagnosis not present

## 2019-01-26 DIAGNOSIS — Z992 Dependence on renal dialysis: Secondary | ICD-10-CM | POA: Diagnosis not present

## 2019-01-26 DIAGNOSIS — D509 Iron deficiency anemia, unspecified: Secondary | ICD-10-CM | POA: Diagnosis not present

## 2019-01-26 DIAGNOSIS — D631 Anemia in chronic kidney disease: Secondary | ICD-10-CM | POA: Diagnosis not present

## 2019-01-26 DIAGNOSIS — N186 End stage renal disease: Secondary | ICD-10-CM | POA: Diagnosis not present

## 2019-01-28 DIAGNOSIS — N186 End stage renal disease: Secondary | ICD-10-CM | POA: Diagnosis not present

## 2019-01-28 DIAGNOSIS — Z992 Dependence on renal dialysis: Secondary | ICD-10-CM | POA: Diagnosis not present

## 2019-01-28 DIAGNOSIS — D631 Anemia in chronic kidney disease: Secondary | ICD-10-CM | POA: Diagnosis not present

## 2019-01-28 DIAGNOSIS — D509 Iron deficiency anemia, unspecified: Secondary | ICD-10-CM | POA: Diagnosis not present

## 2019-01-31 DIAGNOSIS — D631 Anemia in chronic kidney disease: Secondary | ICD-10-CM | POA: Diagnosis not present

## 2019-01-31 DIAGNOSIS — N186 End stage renal disease: Secondary | ICD-10-CM | POA: Diagnosis not present

## 2019-01-31 DIAGNOSIS — Z992 Dependence on renal dialysis: Secondary | ICD-10-CM | POA: Diagnosis not present

## 2019-01-31 DIAGNOSIS — D509 Iron deficiency anemia, unspecified: Secondary | ICD-10-CM | POA: Diagnosis not present

## 2019-02-02 DIAGNOSIS — D509 Iron deficiency anemia, unspecified: Secondary | ICD-10-CM | POA: Diagnosis not present

## 2019-02-02 DIAGNOSIS — N186 End stage renal disease: Secondary | ICD-10-CM | POA: Diagnosis not present

## 2019-02-02 DIAGNOSIS — D631 Anemia in chronic kidney disease: Secondary | ICD-10-CM | POA: Diagnosis not present

## 2019-02-02 DIAGNOSIS — Z992 Dependence on renal dialysis: Secondary | ICD-10-CM | POA: Diagnosis not present

## 2019-02-04 DIAGNOSIS — D631 Anemia in chronic kidney disease: Secondary | ICD-10-CM | POA: Diagnosis not present

## 2019-02-04 DIAGNOSIS — D509 Iron deficiency anemia, unspecified: Secondary | ICD-10-CM | POA: Diagnosis not present

## 2019-02-04 DIAGNOSIS — Z992 Dependence on renal dialysis: Secondary | ICD-10-CM | POA: Diagnosis not present

## 2019-02-04 DIAGNOSIS — N186 End stage renal disease: Secondary | ICD-10-CM | POA: Diagnosis not present

## 2019-02-07 DIAGNOSIS — D631 Anemia in chronic kidney disease: Secondary | ICD-10-CM | POA: Diagnosis not present

## 2019-02-07 DIAGNOSIS — Z992 Dependence on renal dialysis: Secondary | ICD-10-CM | POA: Diagnosis not present

## 2019-02-07 DIAGNOSIS — N186 End stage renal disease: Secondary | ICD-10-CM | POA: Diagnosis not present

## 2019-02-07 DIAGNOSIS — D509 Iron deficiency anemia, unspecified: Secondary | ICD-10-CM | POA: Diagnosis not present

## 2019-02-09 DIAGNOSIS — D631 Anemia in chronic kidney disease: Secondary | ICD-10-CM | POA: Diagnosis not present

## 2019-02-09 DIAGNOSIS — N186 End stage renal disease: Secondary | ICD-10-CM | POA: Diagnosis not present

## 2019-02-09 DIAGNOSIS — Z992 Dependence on renal dialysis: Secondary | ICD-10-CM | POA: Diagnosis not present

## 2019-02-09 DIAGNOSIS — D509 Iron deficiency anemia, unspecified: Secondary | ICD-10-CM | POA: Diagnosis not present

## 2019-02-11 DIAGNOSIS — Z992 Dependence on renal dialysis: Secondary | ICD-10-CM | POA: Diagnosis not present

## 2019-02-11 DIAGNOSIS — D509 Iron deficiency anemia, unspecified: Secondary | ICD-10-CM | POA: Diagnosis not present

## 2019-02-11 DIAGNOSIS — D631 Anemia in chronic kidney disease: Secondary | ICD-10-CM | POA: Diagnosis not present

## 2019-02-11 DIAGNOSIS — N186 End stage renal disease: Secondary | ICD-10-CM | POA: Diagnosis not present

## 2019-02-14 DIAGNOSIS — D509 Iron deficiency anemia, unspecified: Secondary | ICD-10-CM | POA: Diagnosis not present

## 2019-02-14 DIAGNOSIS — Z992 Dependence on renal dialysis: Secondary | ICD-10-CM | POA: Diagnosis not present

## 2019-02-14 DIAGNOSIS — N186 End stage renal disease: Secondary | ICD-10-CM | POA: Diagnosis not present

## 2019-02-14 DIAGNOSIS — D631 Anemia in chronic kidney disease: Secondary | ICD-10-CM | POA: Diagnosis not present

## 2019-02-16 DIAGNOSIS — D509 Iron deficiency anemia, unspecified: Secondary | ICD-10-CM | POA: Diagnosis not present

## 2019-02-16 DIAGNOSIS — Z992 Dependence on renal dialysis: Secondary | ICD-10-CM | POA: Diagnosis not present

## 2019-02-16 DIAGNOSIS — N186 End stage renal disease: Secondary | ICD-10-CM | POA: Diagnosis not present

## 2019-02-16 DIAGNOSIS — D631 Anemia in chronic kidney disease: Secondary | ICD-10-CM | POA: Diagnosis not present

## 2019-02-18 DIAGNOSIS — Z992 Dependence on renal dialysis: Secondary | ICD-10-CM | POA: Diagnosis not present

## 2019-02-18 DIAGNOSIS — D509 Iron deficiency anemia, unspecified: Secondary | ICD-10-CM | POA: Diagnosis not present

## 2019-02-18 DIAGNOSIS — D631 Anemia in chronic kidney disease: Secondary | ICD-10-CM | POA: Diagnosis not present

## 2019-02-18 DIAGNOSIS — N186 End stage renal disease: Secondary | ICD-10-CM | POA: Diagnosis not present

## 2019-02-21 DIAGNOSIS — N186 End stage renal disease: Secondary | ICD-10-CM | POA: Diagnosis not present

## 2019-02-21 DIAGNOSIS — Z6827 Body mass index (BMI) 27.0-27.9, adult: Secondary | ICD-10-CM | POA: Diagnosis not present

## 2019-02-21 DIAGNOSIS — F329 Major depressive disorder, single episode, unspecified: Secondary | ICD-10-CM | POA: Diagnosis not present

## 2019-02-21 DIAGNOSIS — D471 Chronic myeloproliferative disease: Secondary | ICD-10-CM | POA: Diagnosis not present

## 2019-02-21 DIAGNOSIS — D509 Iron deficiency anemia, unspecified: Secondary | ICD-10-CM | POA: Diagnosis not present

## 2019-02-21 DIAGNOSIS — Z299 Encounter for prophylactic measures, unspecified: Secondary | ICD-10-CM | POA: Diagnosis not present

## 2019-02-21 DIAGNOSIS — Z992 Dependence on renal dialysis: Secondary | ICD-10-CM | POA: Diagnosis not present

## 2019-02-21 DIAGNOSIS — D631 Anemia in chronic kidney disease: Secondary | ICD-10-CM | POA: Diagnosis not present

## 2019-02-21 DIAGNOSIS — I10 Essential (primary) hypertension: Secondary | ICD-10-CM | POA: Diagnosis not present

## 2019-02-21 DIAGNOSIS — N183 Chronic kidney disease, stage 3 (moderate): Secondary | ICD-10-CM | POA: Diagnosis not present

## 2019-02-22 DIAGNOSIS — D469 Myelodysplastic syndrome, unspecified: Secondary | ICD-10-CM | POA: Diagnosis not present

## 2019-02-22 DIAGNOSIS — R161 Splenomegaly, not elsewhere classified: Secondary | ICD-10-CM | POA: Diagnosis not present

## 2019-02-23 DIAGNOSIS — N186 End stage renal disease: Secondary | ICD-10-CM | POA: Diagnosis not present

## 2019-02-23 DIAGNOSIS — D631 Anemia in chronic kidney disease: Secondary | ICD-10-CM | POA: Diagnosis not present

## 2019-02-23 DIAGNOSIS — Z992 Dependence on renal dialysis: Secondary | ICD-10-CM | POA: Diagnosis not present

## 2019-02-23 DIAGNOSIS — D509 Iron deficiency anemia, unspecified: Secondary | ICD-10-CM | POA: Diagnosis not present

## 2019-02-25 DIAGNOSIS — Z992 Dependence on renal dialysis: Secondary | ICD-10-CM | POA: Diagnosis not present

## 2019-02-25 DIAGNOSIS — N186 End stage renal disease: Secondary | ICD-10-CM | POA: Diagnosis not present

## 2019-02-25 DIAGNOSIS — D509 Iron deficiency anemia, unspecified: Secondary | ICD-10-CM | POA: Diagnosis not present

## 2019-02-25 DIAGNOSIS — D631 Anemia in chronic kidney disease: Secondary | ICD-10-CM | POA: Diagnosis not present

## 2019-02-28 DIAGNOSIS — N186 End stage renal disease: Secondary | ICD-10-CM | POA: Diagnosis not present

## 2019-02-28 DIAGNOSIS — D509 Iron deficiency anemia, unspecified: Secondary | ICD-10-CM | POA: Diagnosis not present

## 2019-02-28 DIAGNOSIS — D631 Anemia in chronic kidney disease: Secondary | ICD-10-CM | POA: Diagnosis not present

## 2019-02-28 DIAGNOSIS — Z992 Dependence on renal dialysis: Secondary | ICD-10-CM | POA: Diagnosis not present

## 2019-03-02 DIAGNOSIS — D509 Iron deficiency anemia, unspecified: Secondary | ICD-10-CM | POA: Diagnosis not present

## 2019-03-02 DIAGNOSIS — Z992 Dependence on renal dialysis: Secondary | ICD-10-CM | POA: Diagnosis not present

## 2019-03-02 DIAGNOSIS — N186 End stage renal disease: Secondary | ICD-10-CM | POA: Diagnosis not present

## 2019-03-02 DIAGNOSIS — D631 Anemia in chronic kidney disease: Secondary | ICD-10-CM | POA: Diagnosis not present

## 2019-03-04 DIAGNOSIS — Z992 Dependence on renal dialysis: Secondary | ICD-10-CM | POA: Diagnosis not present

## 2019-03-04 DIAGNOSIS — D631 Anemia in chronic kidney disease: Secondary | ICD-10-CM | POA: Diagnosis not present

## 2019-03-04 DIAGNOSIS — D509 Iron deficiency anemia, unspecified: Secondary | ICD-10-CM | POA: Diagnosis not present

## 2019-03-04 DIAGNOSIS — N186 End stage renal disease: Secondary | ICD-10-CM | POA: Diagnosis not present

## 2019-03-07 DIAGNOSIS — D509 Iron deficiency anemia, unspecified: Secondary | ICD-10-CM | POA: Diagnosis not present

## 2019-03-07 DIAGNOSIS — Z992 Dependence on renal dialysis: Secondary | ICD-10-CM | POA: Diagnosis not present

## 2019-03-07 DIAGNOSIS — D631 Anemia in chronic kidney disease: Secondary | ICD-10-CM | POA: Diagnosis not present

## 2019-03-07 DIAGNOSIS — N186 End stage renal disease: Secondary | ICD-10-CM | POA: Diagnosis not present

## 2019-03-08 ENCOUNTER — Other Ambulatory Visit: Payer: Self-pay

## 2019-03-08 DIAGNOSIS — N184 Chronic kidney disease, stage 4 (severe): Secondary | ICD-10-CM

## 2019-03-08 DIAGNOSIS — N183 Chronic kidney disease, stage 3 unspecified: Secondary | ICD-10-CM

## 2019-03-09 DIAGNOSIS — N186 End stage renal disease: Secondary | ICD-10-CM | POA: Diagnosis not present

## 2019-03-09 DIAGNOSIS — Z992 Dependence on renal dialysis: Secondary | ICD-10-CM | POA: Diagnosis not present

## 2019-03-09 DIAGNOSIS — D631 Anemia in chronic kidney disease: Secondary | ICD-10-CM | POA: Diagnosis not present

## 2019-03-09 DIAGNOSIS — D509 Iron deficiency anemia, unspecified: Secondary | ICD-10-CM | POA: Diagnosis not present

## 2019-03-11 DIAGNOSIS — D631 Anemia in chronic kidney disease: Secondary | ICD-10-CM | POA: Diagnosis not present

## 2019-03-11 DIAGNOSIS — Z992 Dependence on renal dialysis: Secondary | ICD-10-CM | POA: Diagnosis not present

## 2019-03-11 DIAGNOSIS — N186 End stage renal disease: Secondary | ICD-10-CM | POA: Diagnosis not present

## 2019-03-11 DIAGNOSIS — D509 Iron deficiency anemia, unspecified: Secondary | ICD-10-CM | POA: Diagnosis not present

## 2019-03-14 DIAGNOSIS — Z992 Dependence on renal dialysis: Secondary | ICD-10-CM | POA: Diagnosis not present

## 2019-03-14 DIAGNOSIS — N186 End stage renal disease: Secondary | ICD-10-CM | POA: Diagnosis not present

## 2019-03-14 DIAGNOSIS — D631 Anemia in chronic kidney disease: Secondary | ICD-10-CM | POA: Diagnosis not present

## 2019-03-14 DIAGNOSIS — D509 Iron deficiency anemia, unspecified: Secondary | ICD-10-CM | POA: Diagnosis not present

## 2019-03-15 ENCOUNTER — Other Ambulatory Visit: Payer: Self-pay | Admitting: *Deleted

## 2019-03-15 ENCOUNTER — Encounter: Payer: Self-pay | Admitting: Vascular Surgery

## 2019-03-15 ENCOUNTER — Encounter: Payer: Self-pay | Admitting: *Deleted

## 2019-03-15 ENCOUNTER — Ambulatory Visit (INDEPENDENT_AMBULATORY_CARE_PROVIDER_SITE_OTHER): Payer: Medicare Other | Admitting: Vascular Surgery

## 2019-03-15 ENCOUNTER — Other Ambulatory Visit: Payer: Self-pay

## 2019-03-15 ENCOUNTER — Ambulatory Visit (HOSPITAL_COMMUNITY)
Admission: RE | Admit: 2019-03-15 | Discharge: 2019-03-15 | Disposition: A | Discharge status: Home / Self Care | Payer: Medicare Other | Source: Ambulatory Visit | Attending: Vascular Surgery | Admitting: Vascular Surgery

## 2019-03-15 ENCOUNTER — Ambulatory Visit (INDEPENDENT_AMBULATORY_CARE_PROVIDER_SITE_OTHER)
Admission: RE | Admit: 2019-03-15 | Discharge: 2019-03-15 | Disposition: A | Discharge status: Home / Self Care | Payer: Medicare Other | Source: Ambulatory Visit | Attending: Vascular Surgery | Admitting: Vascular Surgery

## 2019-03-15 VITALS — BP 159/81 | HR 78 | Resp 20 | Ht 72.0 in | Wt 184.0 lb

## 2019-03-15 DIAGNOSIS — N184 Chronic kidney disease, stage 4 (severe): Secondary | ICD-10-CM | POA: Diagnosis not present

## 2019-03-15 DIAGNOSIS — I2510 Atherosclerotic heart disease of native coronary artery without angina pectoris: Secondary | ICD-10-CM

## 2019-03-15 DIAGNOSIS — Z992 Dependence on renal dialysis: Secondary | ICD-10-CM

## 2019-03-15 DIAGNOSIS — N186 End stage renal disease: Secondary | ICD-10-CM

## 2019-03-15 NOTE — H&P (View-Only) (Signed)
[facility] [place]  Patient name: [name] MRN: [id number] DOB: [birth year] Sex: male  REASON FOR CONSULT: Discuss access for hemodialysis  HPI: [name] is a 73 y.o. male, who is here today for discussion of hemodialysis access.  He currently undergoes dialysis via a right IJ catheter that was placed at [facility].  He is right-handed.  He does not have a pacemaker.  He dialyzes in [facility] he has had no prior right or left arm access.  Past Medical History:  Diagnosis Date  . Anxiety   . Arthritis   . BPH (benign prostatic hyperplasia)   . CAD (coronary artery disease)    DES to circumflex 07/2016, moderate residual LAD and RCA, small 90% OM3 - managed medically  . Chronic lower back pain   . Depression   . Essential hypertension   . Gastritis   . GERD (gastroesophageal reflux disease)   . History of kidney stones   . Leukemia ([place])   . 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[place])    Likely MDS/MPN, unclassifiable - folowed at [facility]  . Pneumonia X 1  . Renal insufficiency   . Spleen enlarged   . Type II diabetes mellitus (HCC)     Family History  Problem Relation Age of Onset  . Heart attack Mother   . CVA Mother   . Heart attack Father   . Colon cancer Neg Hx     SOCIAL HISTORY: Social History   Socioeconomic History  . Marital status: Divorced    Spouse name: Not on file  . Number of children: Not on file  . Years of education: Not on file  . Highest education level: Not on file  Occupational History  . Occupation: retired    Comment: Barista  . Financial resource strain: Not on file  . Food insecurity    Worry: Not on file    Inability: Not on file  . Transportation needs    Medical: Not on file    Non-medical: Not on file  Tobacco Use  . Smoking status: Former Smoker    Packs/day: 1.00    Years: 20.00    Pack years: 20.00    Types: Cigars    Start  date: 06/25/1975    Quit date: 08/04/2013    Years since quitting: 5.6  . Smokeless tobacco: Never Used  Substance and Sexual Activity  . Alcohol use: Yes    Comment: Remote history of ETOH abuse, "when I was young and dumb"   . Drug use: No  . Sexual activity: Yes    Birth control/protection: None  Lifestyle  . Physical activity    Days per week: Not on file    Minutes per session: Not on file  . Stress: Not on file  Relationships  . Social Herbalist on phone: Not on file    Gets together: Not on file    Attends religious service: Not on file    Active member of club or organization: Not on file    Attends meetings of clubs or organizations: Not on file    Relationship status: Not on file  . Intimate partner violence    Fear of current or ex partner: Not on file    Emotionally abused: Not on file    Physically abused: Not on file    Forced sexual activity: Not on file  Other Topics Concern  . Not on file  Social History Narrative  . Not on file    Allergies  Allergen Reactions  . Hydrocodone Itching    Current Outpatient Medications  Medication Sig Dispense Refill  . acetaminophen (TYLENOL) 500 MG tablet Take 1,000 mg by mouth 2 (two) times daily as needed for mild pain or moderate pain.     Marland Kitchen allopurinol (ZYLOPRIM) 100 MG tablet Take 200 mg by mouth every morning.    Marland Kitchen amLODipine (NORVASC) 10 MG tablet TAKE 1 TABLET BY MOUTH ONCE DAILY 90 tablet 2  . DULoxetine (CYMBALTA) 60 MG capsule Take 60 mg by mouth 2 (two) times daily.     . hydrALAZINE (APRESOLINE) 50 MG tablet Take 2 tablets (100 mg total) by mouth 3 (three) times daily. 540 tablet 3  . HYDROcodone-acetaminophen (NORCO/VICODIN) 5-325 MG tablet Take 1 tablet by mouth as needed.   0  . labetalol (NORMODYNE) 200 MG tablet Take 1 tablet (200 mg total) by mouth 2 (two) times daily. 60 tablet 3  . nitroGLYCERIN (NITROSTAT) 0.4 MG SL tablet Place 1 tablet (0.4 mg total) under the tongue every 5 (five)  minutes x 3 doses as needed for chest pain (if no relief after 3rd dose, proceed to the ED for an evaluation or call 911). 25 tablet 3  . pantoprazole (PROTONIX) 40 MG tablet 1 po 30 mins prior to first meal (Patient taking differently: Take 40 mg by mouth daily. 1 po 30 mins prior to first meal) 30 tablet 11  . traZODone (DESYREL) 50 MG tablet Take 50 mg by mouth at bedtime as needed for sleep.    . pravastatin (PRAVACHOL) 40 MG tablet Take 1 tablet (40 mg total) by mouth every evening. 90 tablet 1   No current facility-administered medications for this visit.     REVIEW OF SYSTEMS:  [X]  denotes positive finding, [ ]  denotes negative finding Cardiac  Comments:  Chest pain or chest pressure: x   Shortness of breath upon exertion:    Short of breath when lying flat:    Irregular heart rhythm:        Vascular    Pain in calf, thigh, or hip brought on by ambulation:    Pain in feet at night that wakes you up from your sleep:     Blood clot in your veins:    Leg swelling:  x       Pulmonary    Oxygen at home: x   Productive cough:     Wheezing:         Neurologic    Sudden weakness in arms or legs:     Sudden numbness in arms or legs:     Sudden onset of difficulty speaking or slurred speech:    Temporary loss of vision in one eye:     Problems with dizziness:         Gastrointestinal    Blood in stool:     Vomited blood:         Genitourinary    Burning when urinating:     Blood in urine:        Psychiatric    Major depression:         Hematologic    Bleeding problems:    Problems with blood clotting too easily:        Skin    Rashes or ulcers:        Constitutional    Fever or chills:      PHYSICAL EXAM: Vitals:  03/15/19 1249  BP: (!) 159/81  Pulse: 78  Resp: 20  SpO2: 95%  Weight: 83.5 kg  Height: 6' (1.829 m)    GENERAL: The patient is a well-nourished male, in no acute distress. The vital signs are documented above. CARDIOVASCULAR: 2+ radial  pulses bilaterally.  Large cephalic vein throughout the forearm which is superficial PULMONARY: There is good air exchange  ABDOMEN: Soft and non-tender  MUSCULOSKELETAL: There are no major deformities or cyanosis. NEUROLOGIC: No focal weakness or paresthesias are detected. SKIN: There are no ulcers or rashes noted. PSYCHIATRIC: The patient has a normal affect.  DATA:  Duplex today showed adequate veins for attempted radiocephalic fistula.  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  MEDICAL ISSUES: Had long discussion with the patient and his daughter present.  I have recommended left radiocephalic fistula.  Explained the importance of getting his catheter out as soon as possible to reduce risk for infection.  His dialysis is currently on Monday Wednesday and Friday.  We will coordinate this on a Tuesday or Thursday.  Discussed risks of non-maturation and also steal.   Rosetta Posner, MD FACS [facility] Tel [phone] Pager [phone]

## 2019-03-15 NOTE — Progress Notes (Signed)
Vascular and Vein Specialist of [place]  Patient name: [name] MRN: [id number] DOB: May 15, 1946 Sex: male  REASON FOR CONSULT: Discuss access for hemodialysis  HPI: [name] is a 73 y.o. male, who is here today for discussion of hemodialysis access.  He currently undergoes dialysis via a right IJ catheter that was placed at [facility].  He is right-handed.  He does not have a pacemaker.  He dialyzes in [facility] he has had no prior right or left arm access.  Past Medical History:  Diagnosis Date  . Anxiety   . Arthritis   . BPH (benign prostatic hyperplasia)   . CAD (coronary artery disease)    DES to circumflex 07/2016, moderate residual LAD and RCA, small 90% OM3 - managed medically  . Chronic lower back pain   . Depression   . Essential hypertension   . Gastritis   . GERD (gastroesophageal reflux disease)   . History of kidney stones   . Leukemia ([place])   . 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 Pneumonia X 1  . Renal insufficiency   . Spleen enlarged   . Type II diabetes mellitus (HCC)     Family History  Problem Relation Age of Onset  . Heart attack Mother   . CVA Mother   . Heart attack Father   . Colon cancer Neg Hx     SOCIAL HISTORY: Social History   Socioeconomic History  . Marital status: Divorced    Spouse name: Not on file  . Number of children: Not on file  . Years of education: Not on file  . Highest education level: Not on file  Occupational History  . Occupation: retired    Comment: Barista  . Financial resource strain: Not on file  . Food insecurity    Worry: Not on file    Inability: Not on file  . Transportation needs    Medical: Not on file    Non-medical: Not on file  Tobacco Use  . Smoking status: Former Smoker    Packs/day: 1.00    Years: 20.00    Pack years: 20.00    Types: Cigars    Start  date: 06/25/1975    Quit date: 08/04/2013    Years since quitting: 5.6  . Smokeless tobacco: Never Used  Substance and Sexual Activity  . Alcohol use: Yes    Comment: Remote history of ETOH abuse, "when I was young and dumb"   . Drug use: No  . Sexual activity: Yes    Birth control/protection: None  Lifestyle  . Physical activity    Days per week: Not on file    Minutes per session: Not on file  . Stress: Not on file  Relationships  . Social Herbalist on phone: Not on file    Gets together: Not on file    Attends religious service: Not on file    Active member of club or organization: Not on file    Attends meetings of clubs or organizations: Not on file    Relationship status: Not on file  . Intimate partner violence    Fear of current or ex partner: Not on file    Emotionally abused: Not on file    Physically abused: Not on file    Forced sexual activity: Not on file  Other Topics Concern  . Not on file  Social History Narrative  . Not on file    Allergies  Allergen Reactions  . Hydrocodone Itching    Current Outpatient Medications  Medication Sig Dispense Refill  . acetaminophen (TYLENOL) 500 MG tablet Take 1,000 mg by mouth 2 (two) times daily as needed for mild pain or moderate pain.     Marland Kitchen allopurinol (ZYLOPRIM) 100 MG tablet Take 200 mg by mouth every morning.    Marland Kitchen amLODipine (NORVASC) 10 MG tablet TAKE 1 TABLET BY MOUTH ONCE DAILY 90 tablet 2  . DULoxetine (CYMBALTA) 60 MG capsule Take 60 mg by mouth 2 (two) times daily.     . hydrALAZINE (APRESOLINE) 50 MG tablet Take 2 tablets (100 mg total) by mouth 3 (three) times daily. 540 tablet 3  . HYDROcodone-acetaminophen (NORCO/VICODIN) 5-325 MG tablet Take 1 tablet by mouth as needed.   0  . labetalol (NORMODYNE) 200 MG tablet Take 1 tablet (200 mg total) by mouth 2 (two) times daily. 60 tablet 3  . nitroGLYCERIN (NITROSTAT) 0.4 MG SL tablet Place 1 tablet (0.4 mg total) under the tongue every 5 (five)  minutes x 3 doses as needed for chest pain (if no relief after 3rd dose, proceed to the ED for an evaluation or call 911). 25 tablet 3  . pantoprazole (PROTONIX) 40 MG tablet 1 po 30 mins prior to first meal (Patient taking differently: Take 40 mg by mouth daily. 1 po 30 mins prior to first meal) 30 tablet 11  . traZODone (DESYREL) 50 MG tablet Take 50 mg by mouth at bedtime as needed for sleep.    . pravastatin (PRAVACHOL) 40 MG tablet Take 1 tablet (40 mg total) by mouth every evening. 90 tablet 1   No current facility-administered medications for this visit.     REVIEW OF SYSTEMS:  [X]  denotes positive finding, [ ]  denotes negative finding Cardiac  Comments:  Chest pain or chest pressure: x   Shortness of breath upon exertion:    Short of breath when lying flat:    Irregular heart rhythm:        Vascular    Pain in calf, thigh, or hip brought on by ambulation:    Pain in feet at night that wakes you up from your sleep:     Blood clot in your veins:    Leg swelling:  x       Pulmonary    Oxygen at home: x   Productive cough:     Wheezing:         Neurologic    Sudden weakness in arms or legs:     Sudden numbness in arms or legs:     Sudden onset of difficulty speaking or slurred speech:    Temporary loss of vision in one eye:     Problems with dizziness:         Gastrointestinal    Blood in stool:     Vomited blood:         Genitourinary    Burning when urinating:     Blood in urine:        Psychiatric    Major depression:         Hematologic    Bleeding problems:    Problems with blood clotting too easily:        Skin    Rashes or ulcers:        Constitutional    Fever or chills:      PHYSICAL EXAM: Vitals:  03/15/19 1249  BP: (!) 159/81  Pulse: 78  Resp: 20  SpO2: 95%  Weight: 83.5 kg  Height: 6' (1.829 m)    GENERAL: The patient is a well-nourished male, in no acute distress. The vital signs are documented above. CARDIOVASCULAR: 2+ radial  pulses bilaterally.  Large cephalic vein throughout the forearm which is superficial PULMONARY: There is good air exchange  ABDOMEN: Soft and non-tender  MUSCULOSKELETAL: There are no major deformities or cyanosis. NEUROLOGIC: No focal weakness or paresthesias are detected. SKIN: There are no ulcers or rashes noted. PSYCHIATRIC: The patient has a normal affect.  DATA:  Duplex today showed adequate veins for attempted radiocephalic fistula.  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  MEDICAL ISSUES: Had long discussion with the patient and his daughter present.  I have recommended left radiocephalic fistula.  Explained the importance of getting his catheter out as soon as possible to reduce risk for infection.  His dialysis is currently on Monday Wednesday and Friday.  We will coordinate this on a Tuesday or Thursday.  Discussed risks of non-maturation and also steal.   Rosetta Posner, MD FACS [facility] Ve[facility] Specialists of [facility] Tel [phone] Pager [phone]

## 2019-03-16 DIAGNOSIS — N186 End stage renal disease: Secondary | ICD-10-CM | POA: Diagnosis not present

## 2019-03-16 DIAGNOSIS — D509 Iron deficiency anemia, unspecified: Secondary | ICD-10-CM | POA: Diagnosis not present

## 2019-03-16 DIAGNOSIS — Z992 Dependence on renal dialysis: Secondary | ICD-10-CM | POA: Diagnosis not present

## 2019-03-16 DIAGNOSIS — D631 Anemia in chronic kidney disease: Secondary | ICD-10-CM | POA: Diagnosis not present

## 2019-03-18 DIAGNOSIS — D509 Iron deficiency anemia, unspecified: Secondary | ICD-10-CM | POA: Diagnosis not present

## 2019-03-18 DIAGNOSIS — Z992 Dependence on renal dialysis: Secondary | ICD-10-CM | POA: Diagnosis not present

## 2019-03-18 DIAGNOSIS — D631 Anemia in chronic kidney disease: Secondary | ICD-10-CM | POA: Diagnosis not present

## 2019-03-18 DIAGNOSIS — N186 End stage renal disease: Secondary | ICD-10-CM | POA: Diagnosis not present

## 2019-03-21 DIAGNOSIS — N186 End stage renal disease: Secondary | ICD-10-CM | POA: Diagnosis not present

## 2019-03-21 DIAGNOSIS — D509 Iron deficiency anemia, unspecified: Secondary | ICD-10-CM | POA: Diagnosis not present

## 2019-03-21 DIAGNOSIS — Z992 Dependence on renal dialysis: Secondary | ICD-10-CM | POA: Diagnosis not present

## 2019-03-21 DIAGNOSIS — D631 Anemia in chronic kidney disease: Secondary | ICD-10-CM | POA: Diagnosis not present

## 2019-03-23 DIAGNOSIS — Z992 Dependence on renal dialysis: Secondary | ICD-10-CM | POA: Diagnosis not present

## 2019-03-23 DIAGNOSIS — N186 End stage renal disease: Secondary | ICD-10-CM | POA: Diagnosis not present

## 2019-03-23 DIAGNOSIS — D631 Anemia in chronic kidney disease: Secondary | ICD-10-CM | POA: Diagnosis not present

## 2019-03-23 DIAGNOSIS — E559 Vitamin D deficiency, unspecified: Secondary | ICD-10-CM | POA: Diagnosis not present

## 2019-03-23 DIAGNOSIS — D509 Iron deficiency anemia, unspecified: Secondary | ICD-10-CM | POA: Diagnosis not present

## 2019-03-25 DIAGNOSIS — N186 End stage renal disease: Secondary | ICD-10-CM | POA: Diagnosis not present

## 2019-03-25 DIAGNOSIS — Z992 Dependence on renal dialysis: Secondary | ICD-10-CM | POA: Diagnosis not present

## 2019-03-25 DIAGNOSIS — D509 Iron deficiency anemia, unspecified: Secondary | ICD-10-CM | POA: Diagnosis not present

## 2019-03-25 DIAGNOSIS — E559 Vitamin D deficiency, unspecified: Secondary | ICD-10-CM | POA: Diagnosis not present

## 2019-03-25 DIAGNOSIS — D631 Anemia in chronic kidney disease: Secondary | ICD-10-CM | POA: Diagnosis not present

## 2019-03-28 DIAGNOSIS — D509 Iron deficiency anemia, unspecified: Secondary | ICD-10-CM | POA: Diagnosis not present

## 2019-03-28 DIAGNOSIS — D631 Anemia in chronic kidney disease: Secondary | ICD-10-CM | POA: Diagnosis not present

## 2019-03-28 DIAGNOSIS — N186 End stage renal disease: Secondary | ICD-10-CM | POA: Diagnosis not present

## 2019-03-28 DIAGNOSIS — Z992 Dependence on renal dialysis: Secondary | ICD-10-CM | POA: Diagnosis not present

## 2019-03-28 DIAGNOSIS — E559 Vitamin D deficiency, unspecified: Secondary | ICD-10-CM | POA: Diagnosis not present

## 2019-03-29 DIAGNOSIS — Z713 Dietary counseling and surveillance: Secondary | ICD-10-CM | POA: Diagnosis not present

## 2019-03-29 DIAGNOSIS — N183 Chronic kidney disease, stage 3 (moderate): Secondary | ICD-10-CM | POA: Diagnosis not present

## 2019-03-29 DIAGNOSIS — Z6829 Body mass index (BMI) 29.0-29.9, adult: Secondary | ICD-10-CM | POA: Diagnosis not present

## 2019-03-29 DIAGNOSIS — M549 Dorsalgia, unspecified: Secondary | ICD-10-CM | POA: Diagnosis not present

## 2019-03-29 DIAGNOSIS — Z299 Encounter for prophylactic measures, unspecified: Secondary | ICD-10-CM | POA: Diagnosis not present

## 2019-03-30 DIAGNOSIS — Z992 Dependence on renal dialysis: Secondary | ICD-10-CM | POA: Diagnosis not present

## 2019-03-30 DIAGNOSIS — E559 Vitamin D deficiency, unspecified: Secondary | ICD-10-CM | POA: Diagnosis not present

## 2019-03-30 DIAGNOSIS — N186 End stage renal disease: Secondary | ICD-10-CM | POA: Diagnosis not present

## 2019-03-30 DIAGNOSIS — D631 Anemia in chronic kidney disease: Secondary | ICD-10-CM | POA: Diagnosis not present

## 2019-03-30 DIAGNOSIS — D509 Iron deficiency anemia, unspecified: Secondary | ICD-10-CM | POA: Diagnosis not present

## 2019-04-01 DIAGNOSIS — E559 Vitamin D deficiency, unspecified: Secondary | ICD-10-CM | POA: Diagnosis not present

## 2019-04-01 DIAGNOSIS — Z992 Dependence on renal dialysis: Secondary | ICD-10-CM | POA: Diagnosis not present

## 2019-04-01 DIAGNOSIS — D509 Iron deficiency anemia, unspecified: Secondary | ICD-10-CM | POA: Diagnosis not present

## 2019-04-01 DIAGNOSIS — N186 End stage renal disease: Secondary | ICD-10-CM | POA: Diagnosis not present

## 2019-04-01 DIAGNOSIS — D631 Anemia in chronic kidney disease: Secondary | ICD-10-CM | POA: Diagnosis not present

## 2019-04-04 DIAGNOSIS — E559 Vitamin D deficiency, unspecified: Secondary | ICD-10-CM | POA: Diagnosis not present

## 2019-04-04 DIAGNOSIS — D631 Anemia in chronic kidney disease: Secondary | ICD-10-CM | POA: Diagnosis not present

## 2019-04-04 DIAGNOSIS — D509 Iron deficiency anemia, unspecified: Secondary | ICD-10-CM | POA: Diagnosis not present

## 2019-04-04 DIAGNOSIS — Z992 Dependence on renal dialysis: Secondary | ICD-10-CM | POA: Diagnosis not present

## 2019-04-04 DIAGNOSIS — N186 End stage renal disease: Secondary | ICD-10-CM | POA: Diagnosis not present

## 2019-04-06 DIAGNOSIS — E559 Vitamin D deficiency, unspecified: Secondary | ICD-10-CM | POA: Diagnosis not present

## 2019-04-06 DIAGNOSIS — D631 Anemia in chronic kidney disease: Secondary | ICD-10-CM | POA: Diagnosis not present

## 2019-04-06 DIAGNOSIS — N186 End stage renal disease: Secondary | ICD-10-CM | POA: Diagnosis not present

## 2019-04-06 DIAGNOSIS — D509 Iron deficiency anemia, unspecified: Secondary | ICD-10-CM | POA: Diagnosis not present

## 2019-04-06 DIAGNOSIS — Z992 Dependence on renal dialysis: Secondary | ICD-10-CM | POA: Diagnosis not present

## 2019-04-08 DIAGNOSIS — D509 Iron deficiency anemia, unspecified: Secondary | ICD-10-CM | POA: Diagnosis not present

## 2019-04-08 DIAGNOSIS — D631 Anemia in chronic kidney disease: Secondary | ICD-10-CM | POA: Diagnosis not present

## 2019-04-08 DIAGNOSIS — E559 Vitamin D deficiency, unspecified: Secondary | ICD-10-CM | POA: Diagnosis not present

## 2019-04-08 DIAGNOSIS — N186 End stage renal disease: Secondary | ICD-10-CM | POA: Diagnosis not present

## 2019-04-08 DIAGNOSIS — Z992 Dependence on renal dialysis: Secondary | ICD-10-CM | POA: Diagnosis not present

## 2019-04-11 DIAGNOSIS — D631 Anemia in chronic kidney disease: Secondary | ICD-10-CM | POA: Diagnosis not present

## 2019-04-11 DIAGNOSIS — E559 Vitamin D deficiency, unspecified: Secondary | ICD-10-CM | POA: Diagnosis not present

## 2019-04-11 DIAGNOSIS — N186 End stage renal disease: Secondary | ICD-10-CM | POA: Diagnosis not present

## 2019-04-11 DIAGNOSIS — Z992 Dependence on renal dialysis: Secondary | ICD-10-CM | POA: Diagnosis not present

## 2019-04-11 DIAGNOSIS — D509 Iron deficiency anemia, unspecified: Secondary | ICD-10-CM | POA: Diagnosis not present

## 2019-04-12 ENCOUNTER — Other Ambulatory Visit: Payer: Self-pay

## 2019-04-12 ENCOUNTER — Other Ambulatory Visit (HOSPITAL_COMMUNITY)
Admission: RE | Admit: 2019-04-12 | Discharge: 2019-04-12 | Disposition: A | Discharge status: Home / Self Care | Payer: Medicare Other | Source: Ambulatory Visit | Attending: Vascular Surgery | Admitting: Vascular Surgery

## 2019-04-12 ENCOUNTER — Encounter (HOSPITAL_COMMUNITY): Payer: Self-pay | Admitting: *Deleted

## 2019-04-12 DIAGNOSIS — N186 End stage renal disease: Secondary | ICD-10-CM | POA: Insufficient documentation

## 2019-04-12 DIAGNOSIS — Z01812 Encounter for preprocedural laboratory examination: Secondary | ICD-10-CM | POA: Insufficient documentation

## 2019-04-12 DIAGNOSIS — Z20828 Contact with and (suspected) exposure to other viral communicable diseases: Secondary | ICD-10-CM | POA: Insufficient documentation

## 2019-04-12 LAB — SARS CORONAVIRUS 2 (TAT 6-24 HRS): SARS Coronavirus 2: NEGATIVE

## 2019-04-12 NOTE — Progress Notes (Signed)
[name] denies chest pain or shortness of breath. I told [name] that I had read in Dr. Nelly Laurence notes from 09/2018 visit that patient had reported chest pain, patient repeated denied.  [name] has been on dialysis 6 weeks, he reports that he is having lots of muscle cramps, he denies drinking too much fluid between treatments, "I do everything they tell me and I still have horrible muscle cramps."  [name] has type II diabetes, he is not on medication and does not check his CBG.  [name] does not know what his last A1C was, "they said it was good."

## 2019-04-13 DIAGNOSIS — D509 Iron deficiency anemia, unspecified: Secondary | ICD-10-CM | POA: Diagnosis not present

## 2019-04-13 DIAGNOSIS — Z992 Dependence on renal dialysis: Secondary | ICD-10-CM | POA: Diagnosis not present

## 2019-04-13 DIAGNOSIS — E559 Vitamin D deficiency, unspecified: Secondary | ICD-10-CM | POA: Diagnosis not present

## 2019-04-13 DIAGNOSIS — D631 Anemia in chronic kidney disease: Secondary | ICD-10-CM | POA: Diagnosis not present

## 2019-04-13 DIAGNOSIS — N186 End stage renal disease: Secondary | ICD-10-CM | POA: Diagnosis not present

## 2019-04-13 NOTE — Progress Notes (Signed)
Anesthesia Chart Review: [name]   Case: [id number] Date/Time: 04/14/19 0915   Procedure: ARTERIOVENOUS (AV) FISTULA CREATION LEFT ARM (Left )   Anesthesia type: Choice   Pre-op diagnosis: END STAGE RENAL DISEASE FOR HEMODIALYSIS ACCESS   Location: [facility] OR ROOM 50 / [place] OR   Surgeon: Rosetta Posner, MD      DISCUSSION: Patient is a 73 year old male scheduled for the above procedure.  History includes ESRD (HD MWF, [place] via right IJ TDC, placed 12/15/18), former smoker (quit 2015), DM2, CAD (s/p Synergy DES LCX 08/01/16), HTN, CHF, Myelodysplastic Syndrome, leukemia (not listed in 02/22/19 HEM-ONC by Dr. Florene Glen), splenomegaly (since 2010; also hepatomegaly on 03/08/18 CT), thrombocytopenia (diagnosed 2015), GERD, BPH, chronic back pain, right pleural effusion (s/p thoracentesis 12/10/18, in setting of progression to ESRD).   Last visit with Dr. Harl Bowie 09/2018. Hospitalized at [facility] in 11/2018 and HD initiated. Patient reported a lot of muscle cramps since starting HD, but denied chest pain and SOB per PAT RN phone interview. 04/11/09 COVID-19 negative. He will get labs on arrival. Anesthesia team to evaluate day of surgery.     PROVIDERS: [name], MD is PCP Carlyle Dolly, MD is cardiologist. Last visit 10/04/18. Alan Noble, MD is heme-onc [facility] care everywhere).  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 See his note for additional details regarding his hematology work-up including bone marrow biopsies. He is on [place].  Ramiro Harvest, MD is nephrologist ([place])   LABS: He is for labs on arrival. As of 02/22/19 (Care Everywhere), H/H 12.5/37.0, PLT 298, AST 41, ALT 33.    IMAGES: CXR 12/10/18 [facility]): Impression: 1. Interstitial pulmonary edema with small pleural effusions.  2. Nonspecific perihilar and bibasilar opacities may be secondary to aspiration or pneumonia. 3. No evidence of a pneumothorax.   EKG: EKG  12/08/18 [facility]): According to Result Narrative: Sinus rhythm Nonspecific T wave changes When compared with ECG of 06-Dec-2018 21:29, Nonspecific T wave abnormality now evident in inferior leads Confirmed by HAISTY, DR. W. K. ([id number]) on 12/08/2018 1:54:46 PM    EKG 05/16/18: Normal sinus rhythm Nonspecific ST and T wave abnormality Abnormal ECG No significant change since last tracing Confirmed by Lacretia Leigh ([id number]) on 05/17/2018 7:24:26 PM   CV: Echo 12/07/18 [facility]): SUMMARY The left ventricular size is normal. There is normal left ventricular wall thickness. Left ventricular systolic function is low normal. LV ejection fraction = 55%. The left ventricular wall motion is normal. The right ventricle is normal in size and function. The left atrium is moderately dilated. The right atrium is mildly dilated. There is moderate mitral regurgitation. There is moderate tricuspid regurgitation. Moderate pulmonary hypertension. Trace to mild pulmonic valvular regurgitation. IVC size was moderately dilated. There is trace to small pericardial effusion. it is largest posterior to the  right atrium. There is no significant change in comparison with the last study.   Cardiac cath 08/12/16 [place] Lack, Conception Oms, MD): Conclusion:  Dist RCA lesion, 60 %stenosed.  3rd Mrg lesion, 90 %stenosed. This was a small vessel.  Mid LAD lesion, 40 %stenosed.  Ost LAD lesion, 25 %stenosed.  Mid Cx lesion, 95 %stenosed. This was the culprit lesion. A STENT SYNERGY DES 3X20 drug eluting stent was successfully placed., postdilated to 3.5  Post intervention, there is a 0% residual stenosis.  LV end diastolic pressure is normal.  There is no aortic valve stenosis.  Significant tortuosity in the right subclavian. If emergency cath was needed  in the future, would not use right radial approach.   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 A Synergy stent was used to give him the benefit  of a drug-eluting stent but also allow for shortening of antiplatelet therapy duration. For now, continue dual antiplatelet therapy. His platelet count is normal. I will speak to Dr. Harl Bowie regarding this issue.    Past Medical History:  Diagnosis Date  . Anxiety   . Arthritis   . BPH (benign prostatic hyperplasia)   . CAD (coronary artery disease)    DES to circumflex 07/2016, moderate residual LAD and RCA, small 90% OM3 - managed medically  . CHF (congestive heart failure) ([place])   . Chronic lower back pain   . Depression   . ESRD (end stage renal disease) ([place])    Hemo MWF [facility]  . Essential hypertension   . Gastritis   . GERD (gastroesophageal reflux disease)   . History of kidney stones   . Leukemia ([place])   . Leukocytosis   . 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[place])   . 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 Pleural effusion    had [facility]ancentesis [facility]  . Pneumonia    last time 08/2018- Aspiration Pneumonia - unintentional opiate overdose  . Spleen enlarged   . Thrombocythemia ([place])   . Type II diabetes mellitus ([place])     Past Surgical History:  Procedure Laterality Date  . BIOPSY  12/08/2017   Procedure: BIOPSY;  Surgeon: Danie Binder, MD;  Location: [facility] ENDO SUITE;  Service: Endoscopy;;  gastric  . BONE MARROW BIOPSY     x 3 times  . CARDIAC CATHETERIZATION N/A 08/12/2016   Procedure: Left Heart Cath and Coronary Angiography;  Surgeon: Jettie Booze, MD;  Location: [place] CV LAB;  Service: Cardiovascular;  Laterality: N/A;  . CARDIAC CATHETERIZATION N/A 08/12/2016   Procedure: Coronary Stent Intervention;  Surgeon: Jettie Booze, MD;  Location: [place] CV LAB;  Service: Cardiovascular;  Laterality: N/A;  . CARDIAC CATHETERIZATION  2000s X 1  . COLONOSCOPY WITH PROPOFOL N/A 02/01/2013   SLF: 1. 23 colon polyps removed. 2 retrieved. 2. Mild diverticulosis in teh sigmoid colon 3. Small internal  hemorrhoids 4. The colon is redundant   . COLONOSCOPY WITH PROPOFOL N/A 12/08/2017   Procedure: COLONOSCOPY WITH PROPOFOL;  Surgeon: Danie Binder, MD;  Location: [facility] ENDO SUITE;  Service: Endoscopy;  Laterality: N/A;  1:45pm  . CYSTOSCOPY W/ STONE MANIPULATION    . ESOPHAGOGASTRODUODENOSCOPY (EGD) WITH PROPOFOL N/A 02/01/2013   SLF: 1. Moderate erosive gastritis  . ESOPHAGOGASTRODUODENOSCOPY (EGD) WITH PROPOFOL N/A 12/08/2017   Procedure: ESOPHAGOGASTRODUODENOSCOPY (EGD) WITH PROPOFOL;  Surgeon: Danie Binder, MD;  Location: [facility] ENDO SUITE;  Service: Endoscopy;  Laterality: N/A;  . LUMBAR [place]  . POLYPECTOMY N/A 02/01/2013   Procedure: POLYPECTOMY;  Surgeon: Danie Binder, MD;  Location: [facility] ORS;  Service: Endoscopy;  Laterality: N/A;    MEDICATIONS: No current facility-administered medications for this encounter.    Marland Kitchen allopurinol (ZYLOPRIM) 100 MG tablet  . calcium acetate (PHOSLO) 667 MG capsule  . DULoxetine (CYMBALTA) 60 MG capsule  . hydrALAZINE (APRESOLINE) 50 MG tablet  . nitroGLYCERIN (NITROSTAT) 0.4 MG SL tablet  . ruxolitinib phosphate (JAKAFI) 5 MG tablet  . traZODone (DESYREL) 50 MG tablet  . acetaminophen (TYLENOL) 500 MG tablet     Myra Gianotti, PA-C Surgical Short Stay/Anesthesiology [facility] Phone [phone] [facility]  Phone [phone] 04/13/2019 10:25 AM

## 2019-04-13 NOTE — Anesthesia Preprocedure Evaluation (Addendum)
Anesthesia Evaluation  Patient identified by MRN, date of birth, ID band Patient awake    Reviewed: Allergy & Precautions, NPO status , Patient's Chart, lab work & pertinent test results  History of Anesthesia Complications Negative for: history of anesthetic complications  Airway Mallampati: II  TM Distance: >3 FB Neck ROM: Full    Dental no notable dental hx.    Pulmonary former smoker,    Pulmonary exam normal        Cardiovascular hypertension, Pt. on medications + CAD, + Cardiac Stents (2018) and +CHF  Normal cardiovascular exam  Echo 12/07/18: EF 55%, moderate LAE, mild RAE, moderate MR, moderate TR, moderate pulmonary HTN, mild PVR   Neuro/Psych Anxiety Depression negative neurological ROS  negative psych ROS   GI/Hepatic Neg liver ROS, GERD  Controlled,  Endo/Other  diabetes, Type 2  Renal/GU ESRF and DialysisRenal disease (HD M/W/F)  negative genitourinary   Musculoskeletal  (+) Arthritis ,   Abdominal   Peds  Hematology  (+) anemia , Hgb 12.9   Anesthesia Other Findings Day of surgery medications reviewed with patient.  Reproductive/Obstetrics negative OB ROS                          Anesthesia Physical Anesthesia Plan  ASA: III  Anesthesia Plan: General   Post-op Pain Management:    Induction: Intravenous  PONV Risk Score and Plan: 2 and Treatment may vary due to age or medical condition and Ondansetron  Airway Management Planned: LMA  Additional Equipment: None  Intra-op Plan:   Post-operative Plan: Extubation in OR  Informed Consent: I have reviewed the patients History and Physical, chart, labs and discussed the procedure including the risks, benefits and alternatives for the proposed anesthesia with the patient or authorized representative who has indicated his/her understanding and acceptance.     Dental advisory given  Plan Discussed with:  CRNA  Anesthesia Plan Comments: (PAT note written 04/13/2019 by Myra Gianotti, PA-C. )     Anesthesia Quick Evaluation

## 2019-04-14 ENCOUNTER — Ambulatory Visit (HOSPITAL_COMMUNITY)
Admission: RE | Admit: 2019-04-14 | Discharge: 2019-04-14 | Disposition: A | Discharge status: Home / Self Care | Payer: Medicare Other | Attending: Vascular Surgery | Admitting: Vascular Surgery

## 2019-04-14 ENCOUNTER — Encounter (HOSPITAL_COMMUNITY)
Admission: RE | Disposition: A | Discharge status: Home / Self Care | Payer: Self-pay | Source: Home / Self Care | Attending: Vascular Surgery

## 2019-04-14 ENCOUNTER — Other Ambulatory Visit: Payer: Self-pay

## 2019-04-14 ENCOUNTER — Ambulatory Visit (HOSPITAL_COMMUNITY): Payer: Medicare Other | Admitting: Vascular Surgery

## 2019-04-14 ENCOUNTER — Encounter (HOSPITAL_COMMUNITY): Payer: Self-pay

## 2019-04-14 DIAGNOSIS — F418 Other specified anxiety disorders: Secondary | ICD-10-CM | POA: Insufficient documentation

## 2019-04-14 DIAGNOSIS — I509 Heart failure, unspecified: Secondary | ICD-10-CM | POA: Diagnosis not present

## 2019-04-14 DIAGNOSIS — D631 Anemia in chronic kidney disease: Secondary | ICD-10-CM | POA: Insufficient documentation

## 2019-04-14 DIAGNOSIS — I2510 Atherosclerotic heart disease of native coronary artery without angina pectoris: Secondary | ICD-10-CM | POA: Diagnosis not present

## 2019-04-14 DIAGNOSIS — K219 Gastro-esophageal reflux disease without esophagitis: Secondary | ICD-10-CM | POA: Diagnosis not present

## 2019-04-14 DIAGNOSIS — M1990 Unspecified osteoarthritis, unspecified site: Secondary | ICD-10-CM | POA: Diagnosis not present

## 2019-04-14 DIAGNOSIS — Z955 Presence of coronary angioplasty implant and graft: Secondary | ICD-10-CM | POA: Diagnosis not present

## 2019-04-14 DIAGNOSIS — N186 End stage renal disease: Secondary | ICD-10-CM | POA: Diagnosis not present

## 2019-04-14 DIAGNOSIS — I132 Hypertensive heart and chronic kidney disease with heart failure and with stage 5 chronic kidney disease, or end stage renal disease: Secondary | ICD-10-CM | POA: Diagnosis not present

## 2019-04-14 DIAGNOSIS — N400 Enlarged prostate without lower urinary tract symptoms: Secondary | ICD-10-CM | POA: Insufficient documentation

## 2019-04-14 DIAGNOSIS — E1122 Type 2 diabetes mellitus with diabetic chronic kidney disease: Secondary | ICD-10-CM | POA: Insufficient documentation

## 2019-04-14 DIAGNOSIS — Z992 Dependence on renal dialysis: Secondary | ICD-10-CM | POA: Diagnosis not present

## 2019-04-14 DIAGNOSIS — D469 Myelodysplastic syndrome, unspecified: Secondary | ICD-10-CM | POA: Diagnosis not present

## 2019-04-14 DIAGNOSIS — Z87891 Personal history of nicotine dependence: Secondary | ICD-10-CM | POA: Insufficient documentation

## 2019-04-14 DIAGNOSIS — F419 Anxiety disorder, unspecified: Secondary | ICD-10-CM | POA: Diagnosis not present

## 2019-04-14 DIAGNOSIS — N183 Chronic kidney disease, stage 3 unspecified: Secondary | ICD-10-CM

## 2019-04-14 DIAGNOSIS — N185 Chronic kidney disease, stage 5: Secondary | ICD-10-CM | POA: Diagnosis not present

## 2019-04-14 DIAGNOSIS — Z79899 Other long term (current) drug therapy: Secondary | ICD-10-CM | POA: Diagnosis not present

## 2019-04-14 HISTORY — DX: End stage renal disease: N18.6

## 2019-04-14 HISTORY — DX: Heart failure, unspecified: I50.9

## 2019-04-14 HISTORY — DX: Thrombocytosis, unspecified: D75.839

## 2019-04-14 HISTORY — DX: Pleural effusion, not elsewhere classified: J90

## 2019-04-14 HISTORY — DX: Elevated white blood cell count, unspecified: D72.829

## 2019-04-14 HISTORY — DX: Myelodysplastic disease, not elsewhere classified: C94.6

## 2019-04-14 HISTORY — PX: AV FISTULA PLACEMENT: SHX1204

## 2019-04-14 LAB — POCT I-STAT, CHEM 8
BUN: 38 mg/dL — ABNORMAL HIGH (ref 8–23)
Calcium, Ion: 1.06 mmol/L — ABNORMAL LOW (ref 1.15–1.40)
Chloride: 99 mmol/L (ref 98–111)
Creatinine, Ser: 4.4 mg/dL — ABNORMAL HIGH (ref 0.61–1.24)
Glucose, Bld: 141 mg/dL — ABNORMAL HIGH (ref 70–99)
HCT: 38 % — ABNORMAL LOW (ref 39.0–52.0)
Hemoglobin: 12.9 g/dL — ABNORMAL LOW (ref 13.0–17.0)
Potassium: 4 mmol/L (ref 3.5–5.1)
Sodium: 139 mmol/L (ref 135–145)
TCO2: 27 mmol/L (ref 22–32)

## 2019-04-14 LAB — GLUCOSE, CAPILLARY
Glucose-Capillary: 152 mg/dL — ABNORMAL HIGH (ref 70–99)
Glucose-Capillary: 110 mg/dL — ABNORMAL HIGH (ref 70–99)

## 2019-04-14 SURGERY — ARTERIOVENOUS (AV) FISTULA CREATION
Anesthesia: General | Site: Arm Lower | Laterality: Left

## 2019-04-14 MED ORDER — PROPOFOL 10 MG/ML IV BOLUS
INTRAVENOUS | Status: AC
  Filled 2019-04-14: qty 20

## 2019-04-14 MED ORDER — 0.9 % SODIUM CHLORIDE (POUR BTL) OPTIME
TOPICAL | Status: DC | PRN
  Administered 2019-04-14: 10:00:00 1000 mL

## 2019-04-14 MED ORDER — FENTANYL CITRATE (PF) 100 MCG/2ML IJ SOLN
INTRAMUSCULAR | Status: AC
  Filled 2019-04-14: qty 2

## 2019-04-14 MED ORDER — CEFAZOLIN SODIUM-DEXTROSE 2-4 GM/100ML-% IV SOLN
2.0000 g | INTRAVENOUS | Status: AC
  Administered 2019-04-14: 2 g via INTRAVENOUS

## 2019-04-14 MED ORDER — ACETAMINOPHEN 10 MG/ML IV SOLN
1000.0000 mg | Freq: Once | INTRAVENOUS | Status: DC | PRN
  Administered 2019-04-14: 1000 mg via INTRAVENOUS

## 2019-04-14 MED ORDER — LIDOCAINE 2% (20 MG/ML) 5 ML SYRINGE
INTRAMUSCULAR | Status: AC
  Filled 2019-04-14: qty 10

## 2019-04-14 MED ORDER — FENTANYL CITRATE (PF) 100 MCG/2ML IJ SOLN: 25.0000 ug | INTRAMUSCULAR | Status: DC | PRN

## 2019-04-14 MED ORDER — FENTANYL CITRATE (PF) 100 MCG/2ML IJ SOLN
INTRAMUSCULAR | Status: DC | PRN
  Administered 2019-04-14 (×2): 50 ug via INTRAVENOUS

## 2019-04-14 MED ORDER — ACETAMINOPHEN 10 MG/ML IV SOLN
INTRAVENOUS | Status: AC
  Filled 2019-04-14: qty 100

## 2019-04-14 MED ORDER — LIDOCAINE-EPINEPHRINE (PF) 1 %-1:200000 IJ SOLN
INTRAMUSCULAR | Status: AC
  Filled 2019-04-14: qty 30

## 2019-04-14 MED ORDER — SODIUM CHLORIDE 0.9 % IV SOLN
INTRAVENOUS | Status: AC
  Filled 2019-04-14: qty 1.2

## 2019-04-14 MED ORDER — SODIUM CHLORIDE 0.9 % IV SOLN
INTRAVENOUS | Status: DC
  Administered 2019-04-14 (×2): via INTRAVENOUS

## 2019-04-14 MED ORDER — CHLORHEXIDINE GLUCONATE 4 % EX LIQD: 60.0000 mL | Freq: Once | CUTANEOUS | Status: DC

## 2019-04-14 MED ORDER — LIDOCAINE 2% (20 MG/ML) 5 ML SYRINGE
INTRAMUSCULAR | Status: DC | PRN
  Administered 2019-04-14 (×2): 40 mg via INTRAVENOUS

## 2019-04-14 MED ORDER — MIDAZOLAM HCL 2 MG/2ML IJ SOLN
INTRAMUSCULAR | Status: AC
  Filled 2019-04-14: qty 2

## 2019-04-14 MED ORDER — ONDANSETRON HCL 4 MG/2ML IJ SOLN
INTRAMUSCULAR | Status: AC
  Filled 2019-04-14: qty 2

## 2019-04-14 MED ORDER — MIDAZOLAM HCL 5 MG/5ML IJ SOLN
INTRAMUSCULAR | Status: DC | PRN
  Administered 2019-04-14: 2 mg via INTRAVENOUS

## 2019-04-14 MED ORDER — SODIUM CHLORIDE 0.9 % IV SOLN
INTRAVENOUS | Status: DC | PRN
  Administered 2019-04-14: 500 mL

## 2019-04-14 MED ORDER — FENTANYL CITRATE (PF) 250 MCG/5ML IJ SOLN
INTRAMUSCULAR | Status: AC
  Filled 2019-04-14: qty 5

## 2019-04-14 MED ORDER — SUCCINYLCHOLINE CHLORIDE 200 MG/10ML IV SOSY
PREFILLED_SYRINGE | INTRAVENOUS | Status: AC
  Filled 2019-04-14: qty 10

## 2019-04-14 MED ORDER — PROMETHAZINE HCL 25 MG/ML IJ SOLN: 6.2500 mg | INTRAMUSCULAR | Status: DC | PRN

## 2019-04-14 MED ORDER — ONDANSETRON HCL 4 MG/2ML IJ SOLN
INTRAMUSCULAR | Status: DC | PRN
  Administered 2019-04-14: 4 mg via INTRAVENOUS

## 2019-04-14 MED ORDER — PROPOFOL 10 MG/ML IV BOLUS
INTRAVENOUS | Status: DC | PRN
  Administered 2019-04-14: 130 mg via INTRAVENOUS
  Administered 2019-04-14 (×2): 20 mg via INTRAVENOUS

## 2019-04-14 SURGICAL SUPPLY — 37 items
ADH SKN CLS APL DERMABOND .7 (GAUZE/BANDAGES/DRESSINGS) ×1
ADH SKN CLS LQ APL DERMABOND (GAUZE/BANDAGES/DRESSINGS) ×1
ARMBAND PINK RESTRICT EXTREMIT (MISCELLANEOUS) ×4 IMPLANT
CANISTER SUCT 3000ML PPV (MISCELLANEOUS) ×2 IMPLANT
CANNULA VESSEL 3MM 2 BLNT TIP (CANNULA) ×2 IMPLANT
CLIP LIGATING EXTRA MED SLVR (CLIP) ×2 IMPLANT
CLIP LIGATING EXTRA SM BLUE (MISCELLANEOUS) ×2 IMPLANT
COVER PROBE W GEL 5X96 (DRAPES) ×2 IMPLANT
COVER WAND RF STERILE (DRAPES) ×2 IMPLANT
DECANTER SPIKE VIAL GLASS SM (MISCELLANEOUS) ×2 IMPLANT
DERMABOND ADHESIVE PROPEN (GAUZE/BANDAGES/DRESSINGS) ×1
DERMABOND ADVANCED (GAUZE/BANDAGES/DRESSINGS) ×1
DERMABOND ADVANCED .7 DNX12 (GAUZE/BANDAGES/DRESSINGS) ×1 IMPLANT
DERMABOND ADVANCED .7 DNX6 (GAUZE/BANDAGES/DRESSINGS) IMPLANT
ELECT REM PT RETURN 9FT ADLT (ELECTROSURGICAL) ×2
ELECTRODE REM PT RTRN 9FT ADLT (ELECTROSURGICAL) ×1 IMPLANT
GLOVE BIO SURGEON STRL SZ 6.5 (GLOVE) ×2 IMPLANT
GLOVE BIOGEL PI IND STRL 6.5 (GLOVE) IMPLANT
GLOVE BIOGEL PI IND STRL 7.5 (GLOVE) IMPLANT
GLOVE BIOGEL PI INDICATOR 6.5 (GLOVE) ×1
GLOVE BIOGEL PI INDICATOR 7.5 (GLOVE) ×1
GLOVE ECLIPSE 6.5 STRL STRAW (GLOVE) ×1 IMPLANT
GLOVE SS BIOGEL STRL SZ 7.5 (GLOVE) ×1 IMPLANT
GLOVE SUPERSENSE BIOGEL SZ 7.5 (GLOVE) ×1
GOWN STRL REUS W/ TWL LRG LVL3 (GOWN DISPOSABLE) ×3 IMPLANT
GOWN STRL REUS W/TWL LRG LVL3 (GOWN DISPOSABLE) ×6
KIT BASIN OR (CUSTOM PROCEDURE TRAY) ×2 IMPLANT
KIT TURNOVER KIT B (KITS) ×2 IMPLANT
NS IRRIG 1000ML POUR BTL (IV SOLUTION) ×2 IMPLANT
PACK CV ACCESS (CUSTOM PROCEDURE TRAY) ×2 IMPLANT
PAD ARMBOARD 7.5X6 YLW CONV (MISCELLANEOUS) ×4 IMPLANT
SUT PROLENE 6 0 CC (SUTURE) ×2 IMPLANT
SUT VIC AB 3-0 SH 27 (SUTURE) ×2
SUT VIC AB 3-0 SH 27X BRD (SUTURE) ×1 IMPLANT
TOWEL GREEN STERILE (TOWEL DISPOSABLE) ×2 IMPLANT
UNDERPAD 30X30 (UNDERPADS AND DIAPERS) ×2 IMPLANT
WATER STERILE IRR 1000ML POUR (IV SOLUTION) ×2 IMPLANT

## 2019-04-14 NOTE — Discharge Instructions (Signed)
° °  Vascular and Vein Specialists of [facility]  Discharge Instructions  AV Fistula or Graft Surgery for Dialysis Access  Please refer to the following instructions for your post-procedure care. Your surgeon or physician assistant will discuss any changes with you.  Activity  You may drive the day following your surgery, if you are comfortable and no longer taking prescription pain medication. Resume full activity as the soreness in your incision resolves.  Bathing/Showering  You may shower after you go home. Keep your incision dry for 48 hours. Do not soak in a bathtub, hot tub, or swim until the incision heals completely. You may not shower if you have a hemodialysis catheter.  Incision Care  Clean your incision with mild soap and water after 48 hours. Pat the area dry with a clean towel. You do not need a bandage unless otherwise instructed. Do not apply any ointments or creams to your incision. You may have skin glue on your incision. Do not peel it off. It will come off on its own in about one week. Your arm may swell a bit after surgery. To reduce swelling use pillows to elevate your arm so it is above your heart. Your doctor will tell you if you need to lightly wrap your arm with an ACE bandage.  Diet  Resume your normal diet. There are not special food restrictions following this procedure. In order to heal from your surgery, it is CRITICAL to get adequate nutrition. Your body requires vitamins, minerals, and protein. Vegetables are the best source of vitamins and minerals. Vegetables also provide the perfect balance of protein. Processed food has little nutritional value, so try to avoid this.  Medications  Resume taking all of your medications. If your incision is causing pain, you may take over-the counter pain relievers such as acetaminophen (Tylenol). If you were prescribed a stronger pain medication, please be aware these medications can cause nausea and constipation. Prevent  nausea by taking the medication with a snack or meal. Avoid constipation by drinking plenty of fluids and eating foods with high amount of fiber, such as fruits, vegetables, and grains.  Do not take Tylenol if you are taking prescription pain medications.  Follow up Your surgeon may want to see you in the office following your access surgery. If so, this will be arranged at the time of your surgery.  Please call us immediately for any of the following conditions:  Increased pain, redness, drainage (pus) from your incision site Fever of 101 degrees or higher Severe or worsening pain at your incision site Hand pain or numbness.  Reduce your risk of vascular disease:  Stop smoking. If you would like help, call [facility] at 1-800-QUIT-NOW (575) 566-3197) or [place] at [place] your cholesterol Maintain a desired weight Control your diabetes Keep your blood pressure down  Dialysis  It will take several weeks to several months for your new dialysis access to be ready for use. Your surgeon will determine when it is okay to use it. Your nephrologist will continue to direct your dialysis. You can continue to use your Permcath until your new access is ready for use.   04/14/2019 MCKINNON GLICK [id number] 07/06/1946  Surgeon(s): Early, Arvilla Meres, MD  Procedure(s): Creation of left radial cephalic AV fistula  x Do not stick fistula for 12 weeks    If you have any questions, please call the office at [phone].

## 2019-04-14 NOTE — Anesthesia Postprocedure Evaluation (Signed)
Anesthesia Post Note  Patient: [name]  Procedure(s) Performed: ARTERIOVENOUS (AV) FISTULA CREATION LEFT ARM (Left Arm Lower)     Patient location during evaluation: PACU Anesthesia Type: General Level of consciousness: awake and alert and oriented Pain management: pain level controlled Vital Signs Assessment: post-procedure vital signs reviewed and stable Respiratory status: spontaneous breathing, nonlabored ventilation and respiratory function stable Cardiovascular status: blood pressure returned to baseline Postop Assessment: no apparent nausea or vomiting Anesthetic complications: no    Last Vitals:  Vitals:   04/14/19 1055 04/14/19 1110  BP: (!) 166/87 (!) 151/87  Pulse: 80 76  Resp: 13 16  Temp:    SpO2: 96% 93%    Last Pain:  Vitals:   04/14/19 1110  TempSrc:   PainSc: 0-No pain                 [name]

## 2019-04-14 NOTE — Anesthesia Procedure Notes (Signed)
Procedure Name: LMA Insertion Date/Time: 04/14/2019 9:29 AM Performed by: Moshe Salisbury, CRNA Pre-anesthesia Checklist: Patient identified, Emergency Drugs available, Suction available and Patient being monitored Patient Re-evaluated:Patient Re-evaluated prior to induction Oxygen Delivery Method: Circle System Utilized Preoxygenation: Pre-oxygenation with 100% oxygen Induction Type: IV induction Ventilation: Mask ventilation without difficulty LMA: LMA inserted LMA Size: 5.0 Number of attempts: 1 Placement Confirmation: positive ETCO2 Tube secured with: Tape Dental Injury: Teeth and Oropharynx as per pre-operative assessment

## 2019-04-14 NOTE — Interval H&P Note (Signed)
History and Physical Interval Note:  04/14/2019 7:18 AM  [name]  has presented today for surgery, with the diagnosis of END STAGE RENAL DISEASE FOR HEMODIALYSIS ACCESS.  The various methods of treatment have been discussed with the patient and family. After consideration of risks, benefits and other options for treatment, the patient has consented to  Procedure(s): ARTERIOVENOUS (AV) FISTULA CREATION LEFT ARM (Left) as a surgical intervention.  The patient's history has been reviewed, patient examined, no change in status, stable for surgery.  I have reviewed the patient's chart and labs.  Questions were answered to the patient's satisfaction.     Curt Jews

## 2019-04-14 NOTE — Op Note (Signed)
    OPERATIVE REPORT  DATE OF SURGERY: 04/14/2019  PATIENT: [name], 73 y.o. male MRN: [id number]  DOB: 10/20/1945  PRE-OPERATIVE DIAGNOSIS: End-stage renal disease  POST-OPERATIVE DIAGNOSIS:  Same  PROCEDURE: Left radiocephalic AV fistula creation  SURGEON:  Curt Jews, M.D.  PHYSICIAN ASSISTANT: Liana Crocker, PA-C  ANESTHESIA: LMA  EBL: per anesthesia record  Total I/O In: 500 [I.V.:500] Out: -   BLOOD ADMINISTERED: none  DRAINS: none  SPECIMEN: none  COUNTS CORRECT:  YES  PATIENT DISPOSITION:  PACU - hemodynamically stable  PROCEDURE DETAILS: Patient was taken up and placed supine position with area of the left arm prepped draped in sterile fashion.  Incision was made from the level of the cephalic vein and radial artery at the wrist.  The cephalic vein was of good caliber.  Patient did have a large tributary branch above the incision this was ligated with 3-0 silk ties and divided.  The vein was mobilized proximally and distally and was brought into approximation with the radial artery.  The artery was of good size and had no evidence of atherosclerotic change.  The radial artery was occluded proximally and distally and was opened with an 11 blade and sent longitudinally with Potts scissors.  The vein was cut the appropriate length and was spatulated and sewn end-to-side to the artery with a running 6-0 Prolene suture.  Clamps were removed and excellent thrill was noted.  Wounds irrigated with saline.  Hemostasis electrocautery.  Wounds were closed with 3-0 Vicryl in the subcutaneous and subcuticular tissue.  Sterile dressing was applied and the patient was transferred to the recovery in stable condition   [name], M.D., [facility] 04/14/2019 10:33 AM

## 2019-04-14 NOTE — Transfer of Care (Signed)
Immediate Anesthesia Transfer of Care Note  Patient: [name]  Procedure(s) Performed: ARTERIOVENOUS (AV) FISTULA CREATION LEFT ARM (Left Arm Lower)  Patient Location: PACU  Anesthesia Type:General  Level of Consciousness: awake and patient cooperative  Airway & Oxygen Therapy: Patient Spontanous Breathing and Patient connected to nasal cannula oxygen  Post-op Assessment: Report given to RN and Post -op Vital signs reviewed and stable  Post vital signs: Reviewed and stable  Last Vitals:  Vitals Value Taken Time  BP    Temp    Pulse 76 04/14/19 1039  Resp 15 04/14/19 1039  SpO2 95 % 04/14/19 1039  Vitals shown include unvalidated device data.  Last Pain:  Vitals:   04/14/19 0732  TempSrc:   PainSc: 8       Patients Stated Pain Goal: 2 (94/07/68 0881)  Complications: No apparent anesthesia complications

## 2019-04-15 ENCOUNTER — Encounter (HOSPITAL_COMMUNITY): Payer: Self-pay | Admitting: Vascular Surgery

## 2019-04-15 DIAGNOSIS — E559 Vitamin D deficiency, unspecified: Secondary | ICD-10-CM | POA: Diagnosis not present

## 2019-04-15 DIAGNOSIS — Z992 Dependence on renal dialysis: Secondary | ICD-10-CM | POA: Diagnosis not present

## 2019-04-15 DIAGNOSIS — N186 End stage renal disease: Secondary | ICD-10-CM | POA: Diagnosis not present

## 2019-04-15 DIAGNOSIS — D631 Anemia in chronic kidney disease: Secondary | ICD-10-CM | POA: Diagnosis not present

## 2019-04-15 DIAGNOSIS — E119 Type 2 diabetes mellitus without complications: Secondary | ICD-10-CM | POA: Diagnosis not present

## 2019-04-15 DIAGNOSIS — D509 Iron deficiency anemia, unspecified: Secondary | ICD-10-CM | POA: Diagnosis not present

## 2019-04-19 ENCOUNTER — Emergency Department (HOSPITAL_COMMUNITY): Payer: Medicare Other

## 2019-04-19 ENCOUNTER — Other Ambulatory Visit: Payer: Self-pay

## 2019-04-19 ENCOUNTER — Inpatient Hospital Stay (HOSPITAL_COMMUNITY)
Admission: EM | Admit: 2019-04-19 | Discharge: 2019-04-21 | DRG: 391 | Disposition: A | Discharge status: Home / Self Care | Payer: Medicare Other | Attending: Family Medicine | Admitting: Family Medicine

## 2019-04-19 ENCOUNTER — Encounter (HOSPITAL_COMMUNITY): Payer: Self-pay

## 2019-04-19 DIAGNOSIS — I509 Heart failure, unspecified: Secondary | ICD-10-CM | POA: Diagnosis present

## 2019-04-19 DIAGNOSIS — R0781 Pleurodynia: Secondary | ICD-10-CM | POA: Diagnosis present

## 2019-04-19 DIAGNOSIS — R0602 Shortness of breath: Secondary | ICD-10-CM | POA: Diagnosis not present

## 2019-04-19 DIAGNOSIS — N186 End stage renal disease: Secondary | ICD-10-CM | POA: Diagnosis present

## 2019-04-19 DIAGNOSIS — G8929 Other chronic pain: Secondary | ICD-10-CM | POA: Diagnosis present

## 2019-04-19 DIAGNOSIS — R1031 Right lower quadrant pain: Secondary | ICD-10-CM | POA: Diagnosis not present

## 2019-04-19 DIAGNOSIS — Z87891 Personal history of nicotine dependence: Secondary | ICD-10-CM

## 2019-04-19 DIAGNOSIS — E8889 Other specified metabolic disorders: Secondary | ICD-10-CM | POA: Diagnosis present

## 2019-04-19 DIAGNOSIS — I2510 Atherosclerotic heart disease of native coronary artery without angina pectoris: Secondary | ICD-10-CM | POA: Diagnosis present

## 2019-04-19 DIAGNOSIS — I10 Essential (primary) hypertension: Secondary | ICD-10-CM | POA: Diagnosis not present

## 2019-04-19 DIAGNOSIS — R162 Hepatomegaly with splenomegaly, not elsewhere classified: Secondary | ICD-10-CM | POA: Diagnosis not present

## 2019-04-19 DIAGNOSIS — I132 Hypertensive heart and chronic kidney disease with heart failure and with stage 5 chronic kidney disease, or end stage renal disease: Secondary | ICD-10-CM | POA: Diagnosis not present

## 2019-04-19 DIAGNOSIS — Z20828 Contact with and (suspected) exposure to other viral communicable diseases: Secondary | ICD-10-CM | POA: Diagnosis present

## 2019-04-19 DIAGNOSIS — R52 Pain, unspecified: Secondary | ICD-10-CM | POA: Diagnosis not present

## 2019-04-19 DIAGNOSIS — Z992 Dependence on renal dialysis: Secondary | ICD-10-CM | POA: Diagnosis not present

## 2019-04-19 DIAGNOSIS — R109 Unspecified abdominal pain: Secondary | ICD-10-CM | POA: Diagnosis present

## 2019-04-19 DIAGNOSIS — Z79899 Other long term (current) drug therapy: Secondary | ICD-10-CM

## 2019-04-19 DIAGNOSIS — Z955 Presence of coronary angioplasty implant and graft: Secondary | ICD-10-CM

## 2019-04-19 DIAGNOSIS — R7989 Other specified abnormal findings of blood chemistry: Secondary | ICD-10-CM | POA: Diagnosis not present

## 2019-04-19 DIAGNOSIS — N400 Enlarged prostate without lower urinary tract symptoms: Secondary | ICD-10-CM | POA: Diagnosis present

## 2019-04-19 DIAGNOSIS — R509 Fever, unspecified: Secondary | ICD-10-CM | POA: Diagnosis not present

## 2019-04-19 DIAGNOSIS — N2581 Secondary hyperparathyroidism of renal origin: Secondary | ICD-10-CM | POA: Diagnosis present

## 2019-04-19 DIAGNOSIS — D471 Chronic myeloproliferative disease: Secondary | ICD-10-CM | POA: Diagnosis present

## 2019-04-19 DIAGNOSIS — E1122 Type 2 diabetes mellitus with diabetic chronic kidney disease: Secondary | ICD-10-CM | POA: Diagnosis present

## 2019-04-19 DIAGNOSIS — K219 Gastro-esophageal reflux disease without esophagitis: Secondary | ICD-10-CM | POA: Diagnosis present

## 2019-04-19 DIAGNOSIS — D469 Myelodysplastic syndrome, unspecified: Secondary | ICD-10-CM | POA: Diagnosis present

## 2019-04-19 DIAGNOSIS — M545 Low back pain: Secondary | ICD-10-CM | POA: Diagnosis present

## 2019-04-19 LAB — COMPREHENSIVE METABOLIC PANEL
ALT: 24 U/L (ref 0–44)
AST: 48 U/L — ABNORMAL HIGH (ref 15–41)
Albumin: 3.5 g/dL (ref 3.5–5.0)
Alkaline Phosphatase: 143 U/L — ABNORMAL HIGH (ref 38–126)
Anion gap: 11 (ref 5–15)
BUN: 63 mg/dL — ABNORMAL HIGH (ref 8–23)
CO2: 23 mmol/L (ref 22–32)
Calcium: 8.4 mg/dL — ABNORMAL LOW (ref 8.9–10.3)
Chloride: 104 mmol/L (ref 98–111)
Creatinine, Ser: 4.9 mg/dL — ABNORMAL HIGH (ref 0.61–1.24)
GFR calc Af Amer: 13 mL/min — ABNORMAL LOW (ref >60)
GFR calc non Af Amer: 11 mL/min — ABNORMAL LOW (ref >60)
Glucose, Bld: 92 mg/dL (ref 70–99)
Potassium: 4.6 mmol/L (ref 3.5–5.1)
Sodium: 138 mmol/L (ref 135–145)
Total Bilirubin: 0.5 mg/dL (ref 0.3–1.2)
Total Protein: 6.4 g/dL — ABNORMAL LOW (ref 6.5–8.1)

## 2019-04-19 LAB — URINALYSIS, ROUTINE W REFLEX MICROSCOPIC
Bacteria, UA: NONE SEEN
Bilirubin Urine: NEGATIVE
Glucose, UA: NEGATIVE mg/dL
Hgb urine dipstick: NEGATIVE
Ketones, ur: NEGATIVE mg/dL
Leukocytes,Ua: NEGATIVE
Nitrite: NEGATIVE
Protein, ur: >=300 mg/dL — AB
Specific Gravity, Urine: 1.022 (ref 1.005–1.030)
pH: 7 (ref 5.0–8.0)

## 2019-04-19 LAB — CBC
HCT: 34.7 % — ABNORMAL LOW (ref 39.0–52.0)
Hemoglobin: 11.4 g/dL — ABNORMAL LOW (ref 13.0–17.0)
MCH: 32.3 pg (ref 26.0–34.0)
MCHC: 32.9 g/dL (ref 30.0–36.0)
MCV: 98.3 fL (ref 80.0–100.0)
Platelets: 281 10*3/uL (ref 150–400)
RBC: 3.53 MIL/uL — ABNORMAL LOW (ref 4.22–5.81)
RDW: 19.9 % — ABNORMAL HIGH (ref 11.5–15.5)
WBC: 10.1 10*3/uL (ref 4.0–10.5)
nRBC: 0.8 % — ABNORMAL HIGH (ref 0.0–0.2)

## 2019-04-19 LAB — LACTIC ACID, PLASMA: Lactic Acid, Venous: 0.8 mmol/L (ref 0.5–1.9)

## 2019-04-19 LAB — D-DIMER, QUANTITATIVE: D-Dimer, Quant: 0.9 ug/mL-FEU — ABNORMAL HIGH (ref 0.00–0.50)

## 2019-04-19 LAB — LIPASE, BLOOD: Lipase: 19 U/L (ref 11–51)

## 2019-04-19 MED ORDER — SODIUM CHLORIDE 0.9% FLUSH
3.0000 mL | Freq: Two times a day (BID) | INTRAVENOUS | Status: DC
  Administered 2019-04-20 – 2019-04-21 (×3): 3 mL via INTRAVENOUS

## 2019-04-19 MED ORDER — SODIUM CHLORIDE 0.9 % IV SOLN: 250.0000 mL | INTRAVENOUS | Status: DC | PRN

## 2019-04-19 MED ORDER — HYDRALAZINE HCL 25 MG PO TABS
100.0000 mg | ORAL_TABLET | Freq: Three times a day (TID) | ORAL | Status: DC
  Administered 2019-04-20 – 2019-04-21 (×6): 100 mg via ORAL
  Filled 2019-04-19 (×6): qty 4

## 2019-04-19 MED ORDER — METHOCARBAMOL 500 MG PO TABS
1000.0000 mg | ORAL_TABLET | Freq: Once | ORAL | Status: AC
  Administered 2019-04-19: 21:00:00 1000 mg via ORAL
  Filled 2019-04-19: qty 2

## 2019-04-19 MED ORDER — SODIUM CHLORIDE 0.9% FLUSH: 3.0000 mL | INTRAVENOUS | Status: DC | PRN

## 2019-04-19 MED ORDER — TRAZODONE HCL 50 MG PO TABS
50.0000 mg | ORAL_TABLET | Freq: Every evening | ORAL | Status: DC | PRN
  Administered 2019-04-20: 50 mg via ORAL
  Filled 2019-04-19: qty 1

## 2019-04-19 MED ORDER — FENTANYL CITRATE (PF) 100 MCG/2ML IJ SOLN
25.0000 ug | INTRAMUSCULAR | Status: DC | PRN
  Administered 2019-04-20 (×4): 50 ug via INTRAVENOUS
  Filled 2019-04-19 (×4): qty 2

## 2019-04-19 MED ORDER — FENTANYL CITRATE (PF) 100 MCG/2ML IJ SOLN
50.0000 ug | Freq: Once | INTRAMUSCULAR | Status: AC
  Administered 2019-04-19: 50 ug via INTRAVENOUS
  Filled 2019-04-19: qty 2

## 2019-04-19 MED ORDER — SODIUM CHLORIDE 0.9% FLUSH
3.0000 mL | Freq: Two times a day (BID) | INTRAVENOUS | Status: DC
  Administered 2019-04-20 – 2019-04-21 (×4): 3 mL via INTRAVENOUS

## 2019-04-19 MED ORDER — HYDROMORPHONE HCL 1 MG/ML IJ SOLN
0.5000 mg | Freq: Once | INTRAMUSCULAR | Status: AC
  Administered 2019-04-19: 23:00:00 0.5 mg via INTRAVENOUS
  Filled 2019-04-19: qty 1

## 2019-04-19 MED ORDER — ACETAMINOPHEN 500 MG PO TABS
1000.0000 mg | ORAL_TABLET | Freq: Once | ORAL | Status: AC
  Administered 2019-04-19: 21:00:00 1000 mg via ORAL
  Filled 2019-04-19: qty 2

## 2019-04-19 MED ORDER — ACETAMINOPHEN 325 MG PO TABS
650.0000 mg | ORAL_TABLET | Freq: Four times a day (QID) | ORAL | Status: DC | PRN
  Administered 2019-04-21: 650 mg via ORAL
  Filled 2019-04-19: qty 2

## 2019-04-19 MED ORDER — HEPARIN SODIUM (PORCINE) 5000 UNIT/ML IJ SOLN
5000.0000 [IU] | Freq: Three times a day (TID) | INTRAMUSCULAR | Status: DC
  Administered 2019-04-20 – 2019-04-21 (×6): 5000 [IU] via SUBCUTANEOUS
  Filled 2019-04-19 (×6): qty 1

## 2019-04-19 NOTE — ED Triage Notes (Signed)
Pt is having right sided rib pain. States it is hard to take a deep breath. Has been going on for 2 weeks. Pt has labored breathing

## 2019-04-19 NOTE — ED Provider Notes (Signed)
[name] Provider Note MRN:  [id number]  Arrival date & time: 04/19/19     Chief Complaint   Flank pain History of Present Illness   [name] is a 73 y.o. year-old male with a history of CAD, kidney stone, diabetes, MDS presenting to the ED with chief complaint of flank pain.  Location: Right flank Duration: 5 hours Onset: Sudden Timing: Constant Description: Sharp Severity: Severe, 8 out of 10 Exacerbating/Alleviating Factors: None Associated Symptoms: Shortness of breath Pertinent Negatives: Denies chest pain, no headache, no vision change, no vomiting, no abdominal pain, no hematuria, no dysuria.   Review of Systems  A complete 10 system review of systems was obtained and all systems are negative except as noted in the HPI and PMH.   Patient's Health History    Past Medical History:  Diagnosis Date  . Anxiety   . Arthritis   . BPH (benign prostatic hyperplasia)   . CAD (coronary artery disease)    DES to circumflex 07/2016, moderate residual LAD and RCA, small 90% OM3 - managed medically  . CHF (congestive heart failure) ([place])   . Chronic lower back pain   . Depression   . ESRD (end stage renal disease) ([place])    Hemo MWF [facility]  . Essential hypertension   . Gastritis   . GERD (gastroesophageal reflux disease)   . History of kidney stones   . Leukemia ([place])   . Leukocytosis   . 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 Pleural effusion    had Thorancentesis at [facility]  . Pneumonia    last time 08/2018- Aspiration Pneumonia - unintentional opiate overdose  . Spleen enlarged   . Thrombocythemia ([place])   . Type II diabetes mellitus ([place])     Past Surgical History:  Procedure Laterality Date  . AV FISTULA PLACEMENT Left 04/14/2019   Procedure: ARTERIOVENOUS (AV) FISTULA CREATION LEFT ARM;  Surgeon: Rosetta Posner, MD;   Location: [facility];  Service: Vascular;  Laterality: Left;  . BIOPSY  12/08/2017   Procedure: BIOPSY;  Surgeon: Danie Binder, MD;  [facility];  Service: Endoscopy;;  gastric  . BONE MARROW BIOPSY     x 3 times  . CARDIAC CATHETERIZATION N/A 08/12/2016   Procedure: Left Heart Cath and Coronary Angiography;  Surgeon: Jettie Booze, MD;  Location: [place] CV [facility];  Service: Cardiovascular;  Laterality: N/A;  . CARDIAC CATHETERIZATION N/A 08/12/2016   Procedure: Coronary Stent Intervention;  Surgeon: Jettie Booze, MD;  Location: [place] [facility];  Service: Cardiovascular;  Laterality: N/A;  . CARDIAC CATHETERIZATION  2000s X 1  . COLONOSCOPY WITH PROPOFOL N/A 02/01/2013   SLF: 1. 23 colon polyps removed. 2 retrieved. 2. Mild diverticulosis in teh sigmoid colon 3. Small internal hemorrhoids 4. The colon is redundant   . COLONOSCOPY WITH PROPOFOL N/A 12/08/2017   Procedure: COLONOSCOPY WITH PROPOFOL;  Surgeon: Danie Binder, MD;  [facility];  Service: Endoscopy;  Laterality: N/A;  1:45pm  . CYSTOSCOPY W/ STONE MANIPULATION    . ESOPHAGOGASTRODUODENOSCOPY (EGD) WITH PROPOFOL N/A 02/01/2013   SLF: 1. Moderate erosive gastritis  . ESOPHAGOGASTRODUODENOSCOPY (EGD) WITH PROPOFOL N/A 12/08/2017   Procedure: ESOPHAGOGASTRODUODENOSCOPY (EGD) WITH PROPOFOL;  Surgeon: Danie Binder, MD;  [facility];  Service: Endoscopy;  Laterality: N/A;  . LUMBAR [place]  .  POLYPECTOMY N/A 02/01/2013   Procedure: POLYPECTOMY;  Surgeon: Danie Binder, MD;  Location: [facility];  Service: Endoscopy;  Laterality: N/A;    Family History  Problem Relation Age of Onset  . Heart attack Mother   . CVA Mother   . Heart attack Father   . Colon cancer Neg Hx     Social History   Socioeconomic History  . Marital status: Divorced    Spouse name: Not on file  . Number of children: Not on file  . Years of education: Not on file  . Highest education level: Not on  file  Occupational History  . Occupation: retired    Comment: Barista  . Financial resource strain: Not on file  . Food insecurity    Worry: Not on file    Inability: Not on file  . Transportation needs    Medical: Not on file    Non-medical: Not on file  Tobacco Use  . Smoking status: Former Smoker    Packs/day: 1.00    Years: 20.00    Pack years: 20.00    Types: Cigars    Start date: 06/25/1975    Quit date: 08/04/2013    Years since quitting: 5.7  . Smokeless tobacco: Never Used  Substance and Sexual Activity  . Alcohol use: Not Currently    Comment: Remote history of ETOH abuse, "when I was young and dumb"   . Drug use: No  . Sexual activity: Yes    Birth control/protection: None  Lifestyle  . Physical activity    Days per week: Not on file    Minutes per session: Not on file  . Stress: Not on file  Relationships  . Social Herbalist on phone: Not on file    Gets together: Not on file    Attends religious service: Not on file    Active member of club or organization: Not on file    Attends meetings of clubs or organizations: Not on file    Relationship status: Not on file  . Intimate partner violence    Fear of current or ex partner: Not on file    Emotionally abused: Not on file    Physically abused: Not on file    Forced sexual activity: Not on file  Other Topics Concern  . Not on file  Social History Narrative  . Not on file     Physical Exam  Vital Signs and Nursing Notes reviewed Vitals:   04/19/19 1712  BP: (!) 169/87  Pulse: 72  SpO2: 97%    CONSTITUTIONAL: Well-appearing, in mild distress due to pain NEURO:  Alert and oriented x 3, no focal deficits EYES:  eyes equal and reactive ENT/NECK:  no LAD, no JVD CARDIO: Regular rate, well-perfused, normal S1 and S2 PULM:  CTAB no wheezing or rhonchi GI/GU:  normal bowel sounds, non-distended, non-tender; right CVA tenderness MSK/SPINE:  No gross deformities, no edema  SKIN:  no rash, atraumatic PSYCH:  Appropriate speech and behavior  Diagnostic and Interventional Summary    EKG Interpretation  Date/Time:  Tuesday April 19 2019 18:13:21 EDT Ventricular Rate:  77 PR Interval:  144 QRS Duration: 86 QT Interval:  424 QTC Calculation: 479 R Axis:   53 Text Interpretation:  Sinus rhythm with occasional Premature ventricular complexes and Premature atrial complexes Nonspecific ST and T wave abnormality Abnormal ECG Confirmed by Gerlene Fee [phone]) on 04/19/2019 8:32:09 PM      Labs  Reviewed  CBC - Abnormal; Notable for the following components:      Result Value   RBC 3.53 (*)    Hemoglobin 11.4 (*)    HCT 34.7 (*)    RDW 19.9 (*)    nRBC 0.8 (*)    All other components within normal limits  COMPREHENSIVE METABOLIC PANEL - Abnormal; Notable for the following components:   BUN 63 (*)    Creatinine, Ser 4.90 (*)    Calcium 8.4 (*)    Total Protein 6.4 (*)    AST 48 (*)    Alkaline Phosphatase 143 (*)    GFR calc non Af Amer 11 (*)    GFR calc Af Amer 13 (*)    All other components within normal limits  URINALYSIS, ROUTINE W REFLEX MICROSCOPIC - Abnormal; Notable for the following components:   Protein, ur >=300 (*)    All other components within normal limits  D-DIMER, QUANTITATIVE (NOT AT [facility]) - Abnormal; Notable for the following components:   D-Dimer, Quant 0.90 (*)    All other components within normal limits  SARS CORONAVIRUS 2 (TAT 6-24 HRS)  LIPASE, BLOOD  LACTIC ACID, PLASMA    CT RENAL STONE STUDY  Final Result    DG Chest 2 View  Final Result      Medications  fentaNYL (SUBLIMAZE) injection 50 mcg (50 mcg Intravenous Given 04/19/19 1909)  fentaNYL (SUBLIMAZE) injection 50 mcg (50 mcg Intravenous Given 04/19/19 2017)  methocarbamol (ROBAXIN) tablet 1,000 mg (1,000 mg Oral Given 04/19/19 2101)  acetaminophen (TYLENOL) tablet 1,000 mg (1,000 mg Oral Given 04/19/19 2101)  HYDROmorphone (DILAUDID) injection 0.5 mg (0.5 mg  Intravenous Given 04/19/19 2238)     Procedures Critical Care  ED Course and Medical Decision Making  I have reviewed the triage vital signs and the nursing notes.  Pertinent labs & imaging results that were available during my care of the patient were reviewed by me and considered in my medical decision making (see below for details).  History of stones here with sudden onset flank pain, CT to evaluate.  CT without acute findings.  Labs otherwise reassuring.  Patient continues to have pain.  Upon further assessment, the pain is at the level of the right lung base.  The pain is at times worse with deep breaths.  The pain is not changed with movement or twisting of the torso.  Unfortunately unable to perform CTA of the chest given his fairly recent transition to dialysis according to radiology protocols.  D-dimer obtained and is elevated.  Admitted to hospitalist service for pain management and likely VQ scan.  Barth Kirks. [place] Small, [place] [email or web address]  Final Clinical Impressions(s) / ED Diagnoses     ICD-10-CM   1. Pleuritic pain  R07.81   2. Flank pain  R10.9   3. Elevated d-dimer  R79.89     ED Discharge Orders    None      Discharge Instructions Discussed with and Provided to Patient: Discharge Instructions   None       Maudie Flakes, MD 04/19/19 2248

## 2019-04-19 NOTE — H&P (Signed)
History and Physical    [name]:[id number] DOB: 01-04-1946 DOA: 04/19/2019  PCP: [name], MD   Patient coming from: Home   Chief Complaint: Right flank pain, severe, worse with deep breath   HPI: [name] is a 73 y.o. male with medical history significant for myelodysplasia/myeloproliferative overlap disorder, coronary artery disease, ESRD on hemodialysis, hypertension, and chronic back pain, now presenting to emergency department for evaluation of severe right flank pain.  Patient reports that he developed pain in his right flank 2 weeks ago without any trauma or inciting event that he could identify.  Pain is fairly constant, much worse with deep inspiration.  Also worse with palpation.  He reports a history of kidney stones.  Denies any fevers or chills.  Reports that he has been dyspneic for well over a month and that has not really changed much.  He denies any significant cough.  Denies chest pain.  Denies any lower extremity swelling or tenderness, and denies any history of DVT or PE.  No hematuria.  ED Course: Upon arrival to the ED, patient is found to be saturating well on room air, and with stable blood pressure.  EKG features of sinus rhythm with PVCs and nonspecific ST-T abnormality.  Chest x-ray is negative for acute cardiopulmonary disease.  CT renal stone study is negative for urolithiasis or hydronephrosis, but notable for bladder wall thickening with perivesicular stranding.  Chemistry panel features a potassium 4.6, bicarbonate 23, and BUN 63.  CBC notable for a mild normocytic anemia.  D-dimer elevated to 0.90.  Patient was treated with fentanyl, Dilaudid, Robaxin, and Tylenol in the ED.  COVID-19 testing is in process.  Hospitalists asked to admit for intractable pain and possible VQ scan in the morning.  Review of Systems:  All other systems reviewed and apart from HPI, are negative.  Past Medical History:  Diagnosis Date  . Anxiety   . Arthritis   .  BPH (benign prostatic hyperplasia)   . CAD (coronary artery disease)    DES to circumflex 07/2016, moderate residual LAD and RCA, small 90% OM3 - managed medically  . CHF (congestive heart failure) ([place])   . Chronic lower back pain   . Depression   . ESRD (end stage renal disease) ([place])    Hemo MWF [facility]  . Essential hypertension   . Gastritis   . GERD (gastroesophageal reflux disease)   . History of kidney stones   . Leukemia ([place])   . Leukocytosis   . 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  . Pleural effusion    had Thorancentesis at [facility]  . Pneumonia    last time 08/2018- Aspiration Pneumonia - unintentional opiate overdose  . Spleen enlarged   . Thrombocythemia ([place])   . Type II diabetes mellitus ([place])     Past Surgical History:  Procedure Laterality Date  . AV FISTULA PLACEMENT Left 04/14/2019   Procedure: ARTERIOVENOUS (AV) FISTULA CREATION LEFT ARM;  Surgeon: Rosetta Posner, MD;  Location: [place];  Service: Vascular;  Laterality: Left;  . BIOPSY  12/08/2017   Procedure: BIOPSY;  Surgeon: Danie Binder, MD;  [facility];  Service: Endoscopy;;  gastric  . BONE MARROW BIOPSY     x 3 times  . CARDIAC CATHETERIZATION N/A 08/12/2016   Procedure: Left Heart Cath and Coronary Angiography;  Surgeon: Jettie Booze, MD;  Location: [place]  CV LAB;  Service: Cardiovascular;  Laterality: N/A;  . CARDIAC CATHETERIZATION N/A 08/12/2016   Procedure: Coronary Stent Intervention;  Surgeon: Jettie Booze, MD;  Location: [place] CV LAB;  Service: Cardiovascular;  Laterality: N/A;  . CARDIAC CATHETERIZATION  2000s X 1  . COLONOSCOPY WITH PROPOFOL N/A 02/01/2013   SLF: 1. 23 colon polyps removed. 2 retrieved. 2. Mild diverticulosis in teh sigmoid colon 3. Small internal hemorrhoids 4. The colon is redundant   . COLONOSCOPY WITH PROPOFOL N/A 12/08/2017   Procedure:  COLONOSCOPY WITH PROPOFOL;  Surgeon: Danie Binder, MD;  [facility];  Service: Endoscopy;  Laterality: N/A;  1:45pm  . CYSTOSCOPY W/ STONE MANIPULATION    . ESOPHAGOGASTRODUODENOSCOPY (EGD) WITH PROPOFOL N/A 02/01/2013   SLF: 1. Moderate erosive gastritis  . ESOPHAGOGASTRODUODENOSCOPY (EGD) WITH PROPOFOL N/A 12/08/2017   Procedure: ESOPHAGOGASTRODUODENOSCOPY (EGD) WITH PROPOFOL;  Surgeon: Danie Binder, MD;  [facility];  Service: Endoscopy;  Laterality: N/A;  . LUMBAR [place]  . POLYPECTOMY N/A 02/01/2013   Procedure: POLYPECTOMY;  Surgeon: Danie Binder, MD;  Location: [facility];  Service: Endoscopy;  Laterality: N/A;     reports that he quit smoking about 5 years ago. His smoking use included cigars. He started smoking about 43 years ago. He has a 20.00 pack-year smoking history. He has never used smokeless tobacco. He reports previous alcohol use. He reports that he does not use drugs.  Allergies  Allergen Reactions  . Hydrocodone Itching    Family History  Problem Relation Age of Onset  . Heart attack Mother   . CVA Mother   . Heart attack Father   . Colon cancer Neg Hx      Prior to Admission medications   Medication Sig Start Date End Date Taking? Authorizing Provider  acetaminophen (TYLENOL) 500 MG tablet Take 1,000 mg by mouth 2 (two) times daily as needed for mild pain or moderate pain.     [provider]  allopurinol (ZYLOPRIM) 100 MG tablet Take 200 mg by mouth every morning.    [provider]  calcium acetate (PHOSLO) 667 MG capsule Take 667 mg by mouth 3 (three) times daily with meals.    [provider]  DULoxetine (CYMBALTA) 60 MG capsule Take 60 mg by mouth daily.  04/09/16   [provider]  hydrALAZINE (APRESOLINE) 50 MG tablet Take 2 tablets (100 mg total) by mouth 3 (three) times daily. 10/20/18   Arnoldo Lenis, MD  nitroGLYCERIN (NITROSTAT) 0.4 MG SL tablet Place 1 tablet (0.4 mg total)  under the tongue every 5 (five) minutes x 3 doses as needed for chest pain (if no relief after 3rd dose, proceed to the ED for an evaluation or call 911). 10/04/18   Arnoldo Lenis, MD  ruxolitinib phosphate (JAKAFI) 5 MG tablet Take 5 mg by mouth daily.    [provider]  traZODone (DESYREL) 50 MG tablet Take 50 mg by mouth at bedtime as needed for sleep. 07/18/16   [provider]    Physical Exam: Vitals:   04/19/19 1711 04/19/19 1712  BP:  (!) 169/87  Pulse:  72  SpO2:  97%  Weight: 85.3 kg   Height: 6' (1.829 m)     Constitutional: NAD, calm  Eyes: PERTLA, lids and conjunctivae normal ENMT: Mucous membranes are moist. Posterior pharynx clear of any exudate or lesions.   Neck: normal, supple, no masses, no thyromegaly Respiratory: no wheezing, no  crackles. Normal respiratory effort. No accessory muscle use.  Cardiovascular: S1 & S2 heard, regular rate and rhythm. No extremity edema.   Abdomen: No distension, no tenderness, soft. Bowel sounds active.  Musculoskeletal: no clubbing / cyanosis. No joint deformity upper and lower extremities.   Skin: no significant rashes, lesions, ulcers. Warm, dry, well-perfused. Neurologic: CN 2-12 grossly intact. Sensation intact. Strength 5/5 in all 4 limbs.  Psychiatric: Alert and oriented x 3. Calm, cooperative.    Labs on Admission: I have personally reviewed following labs and imaging studies  CBC: Recent Labs  Lab 04/14/19 0720 04/19/19 1851  WBC  --  10.1  HGB 12.9* 11.4*  HCT 38.0* 34.7*  MCV  --  98.3  PLT  --  119   Basic Metabolic Panel: Recent Labs  Lab 04/14/19 0720 04/19/19 1851  NA 139 138  K 4.0 4.6  CL 99 104  CO2  --  23  GLUCOSE 141* 92  BUN 38* 63*  CREATININE 4.40* 4.90*  CALCIUM  --  8.4*   GFR: Estimated Creatinine Clearance: 14.7 mL/min (A) (by C-G formula based on SCr of 4.9 mg/dL (H)). Liver Function Tests: Recent Labs  Lab 04/19/19 1851  AST 48*  ALT 24  ALKPHOS 143*   BILITOT 0.5  PROT 6.4*  ALBUMIN 3.5   Recent Labs  Lab 04/19/19 1851  LIPASE 19   No results for input(s): AMMONIA in the last 168 hours. Coagulation Profile: No results for input(s): INR, PROTIME in the last 168 hours. Cardiac Enzymes: No results for input(s): CKTOTAL, CKMB, CKMBINDEX, TROPONINI in the last 168 hours. BNP (last 3 results) No results for input(s): PROBNP in the last 8760 hours. HbA1C: No results for input(s): HGBA1C in the last 72 hours. CBG: Recent Labs  Lab 04/14/19 0704 04/14/19 1039  GLUCAP 152* 110*   Lipid Profile: No results for input(s): CHOL, HDL, LDLCALC, TRIG, CHOLHDL, LDLDIRECT in the last 72 hours. Thyroid Function Tests: No results for input(s): TSH, T4TOTAL, FREET4, T3FREE, THYROIDAB in the last 72 hours. Anemia Panel: No results for input(s): VITAMINB12, FOLATE, FERRITIN, TIBC, IRON, RETICCTPCT in the last 72 hours. Urine analysis:    Component Value Date/Time   COLORURINE YELLOW 04/19/2019 1924   APPEARANCEUR CLEAR 04/19/2019 1924   LABSPEC 1.022 04/19/2019 1924   PHURINE 7.0 04/19/2019 1924   GLUCOSEU NEGATIVE 04/19/2019 [place] NEGATIVE 04/19/2019 [place] NEGATIVE 04/19/2019 [place] NEGATIVE 04/19/2019 1924   PROTEINUR >=300 (A) 04/19/2019 1924   UROBILINOGEN 0.2 03/26/2008 1920   NITRITE NEGATIVE 04/19/2019 1924   LEUKOCYTESUR NEGATIVE 04/19/2019 1924   Sepsis Labs: '@LABRCNTIP'$ (procalcitonin:4,lacticidven:4) ) Recent Results (from the past 240 hour(s))  SARS CORONAVIRUS 2 (TAT 6-24 HRS) Nasopharyngeal Nasopharyngeal Swab     Status: None   Collection Time: 04/12/19  7:13 AM   Specimen: Nasopharyngeal Swab  Result Value Ref Range Status   SARS Coronavirus 2 NEGATIVE NEGATIVE Final    Comment: (NOTE) SARS-CoV-2 target nucleic acids are NOT DETECTED. The SARS-CoV-2 RNA is generally detectable in upper and lower respiratory specimens during the acute phase of infection. Negative results do not  preclude SARS-CoV-2 infection, do not rule out co-infections with other pathogens, and should not be used as the sole basis for treatment or other patient management decisions. Negative results must be combined with clinical observations, patient history, and epidemiological information. The expected result is Negative. Fact Sheet for Patients: SugarRoll.be Fact Sheet for Healthcare Providers: https://www.woods-mathews.com/ This test is not yet  approved or cleared by the [place] and  has been authorized for detection and/or diagnosis of SARS-CoV-2 by FDA under an Emergency Use Authorization (EUA). This EUA will remain  in effect (meaning this test can be used) for the duration of the COVID-19 declaration under Section 56 4(b)(1) of the Act, 21 U.S.C. section 360bbb-3(b)(1), unless the authorization is terminated or revoked sooner. Performed at [facility], [place] [address] [place], [place] [address]      Radiological Exams on Admission: Dg Chest 2 View  Result Date: 04/19/2019 CLINICAL DATA:  Shortness of breath. EXAM: CHEST - 2 VIEW COMPARISON:  Radiographs of May 16, 2018. FINDINGS: The heart size and mediastinal contours are within normal limits. Both lungs are clear. No pneumothorax or pleural effusion is noted. Right internal jugular dialysis catheter is noted with tip in expected position of cavoatrial junction. The visualized skeletal structures are unremarkable. IMPRESSION: No active cardiopulmonary disease. Electronically Signed   By: Marijo Conception M.D.   On: 04/19/2019 20:05   Ct Renal Stone Study  Result Date: 04/19/2019 CLINICAL DATA:  Flank pain, history of urolithiasis is you have there is EXAM: CT ABDOMEN AND PELVIS WITHOUT CONTRAST TECHNIQUE: Multidetector CT imaging of the abdomen and pelvis was performed following the standard protocol without IV contrast. COMPARISON:  CT March 08, 2018 FINDINGS: Lower  chest: There are bibasilar atelectatic changes. Lung bases are otherwise clear. Catheter tip projects at the level of the right atrium. Atherosclerotic calcification of the coronary arteries. Calcifications are present on the aortic leaflets. Hepatobiliary: Mild hepatomegaly. Stable punctate calcification along the falciform ligament. No concerning focal liver abnormality is seen. No gallstones, gallbladder wall thickening, or biliary dilatation. Pancreas: Unremarkable. No pancreatic ductal dilatation or surrounding inflammatory changes. Spleen: Markedly enlarged spleen with few punctate calcifications. No concerning splenic lesions. Adrenals/Urinary Tract: Normal adrenal glands. Mild bilateral symmetric perinephric stranding, a nonspecific finding though may correlate with either age or decreased renal function. Kidneys are otherwise unremarkable, without renal calculi, suspicious lesion, or hydronephrosis. Bladder wall thickening is greater than expected for the degree of underdistention with some perivesicular stranding. Stomach/Bowel: Small hiatal hernia. Esophagus and stomach are otherwise unremarkable. Duodenal sweep is normal course and caliber. Distal esophagus, stomach and duodenal sweep are unremarkable. No bowel wall thickening or dilatation. No evidence of obstruction. A normal appendix is visualized. No colonic dilatation or wall thickening. Vascular/Lymphatic: Extensive atherosclerotic calcification of the aorta. Luminal evaluation is limited in the absence of contrast media. No suspicious or enlarged lymph nodes in the included lymphatic chains. Reproductive: The prostate and seminal vesicles are unremarkable. Other: No abdominopelvic free fluid or free gas. No bowel containing hernias. Bilateral inguinal hernias. Injection granulomata are present as well as a large stable dystrophic calcification in the left gluteal soft tissues. Musculoskeletal: Bones are diffusely demineralized. Multilevel  degenerative changes are present in the imaged portions of the spine. Postsurgical changes from prior L4, L5 posterior decompression. No acute osseous abnormality or suspicious osseous lesion. IMPRESSION: 1. No evidence of urolithiasis or hydronephrosis. 2. Bladder wall thickening is greater than expected for the degree of underdistention with some perivesicular stranding. Correlate with urinalysis to exclude cystitis. 3. Stable hepatosplenomegaly. 4. Prior L4-5 posterior decompression. 5. Coronary atherosclerosis. 6. Aortic Atherosclerosis (ICD10-I70.0). Electronically Signed   By: Lovena Le M.D.   On: 04/19/2019 20:25    EKG: Independently reviewed. Sinus rhythm, PVC's, non-specific ST-T abnormalities.   Assessment/Plan   1. Intractable right flank pain  - Presents with  severe right flank pain, worsening over the past two weeks, worse with deep breath, associated with SOB that has been more longstanding, and with negative CT renal stone study  - His d-dimer is elevated and his symptoms could be secondary to PE, but there is pain on palpation and MSK etiology seems more likely  - He continues to complain of severe pain after multiple doses of IV analgesic in ED  - Continue pain-control, check VQ scan   2. ESRD on HD  - No indication for urgent HD - Message sent to nephrology to request maintenance HD while in hospital  - SLIV, renally-dose medications    3. Hypertension  - Continue hydralazine    4. Myelodysplastic syndrome/Myeloproliferative neoplasm  - Followed by [facility]oology/[facility] at [facility]  - Hgb a little lower than earlier this month, he is due for HD in am and this may be dilutional  - He will continue heme/onc follow-up     PPE: Mask, face shield. Patient wearing mask.  DVT prophylaxis: sq heparin  Code Status: Full  Family Communication: Discussed with patient  Consults called: None  Admission status: Observation     Vianne Bulls, MD [facility] Pager  [phone]  If 7PM-7AM, please contact night-coverage [email or web address] Password [facility]  04/19/2019, 11:23 PM

## 2019-04-19 NOTE — ED Notes (Signed)
Patient given low sodium/heart healthy meal.

## 2019-04-20 ENCOUNTER — Observation Stay (HOSPITAL_COMMUNITY): Payer: Medicare Other

## 2019-04-20 ENCOUNTER — Other Ambulatory Visit: Payer: Self-pay

## 2019-04-20 ENCOUNTER — Encounter (HOSPITAL_COMMUNITY): Payer: Self-pay

## 2019-04-20 DIAGNOSIS — E8889 Other specified metabolic disorders: Secondary | ICD-10-CM | POA: Diagnosis present

## 2019-04-20 DIAGNOSIS — R7989 Other specified abnormal findings of blood chemistry: Secondary | ICD-10-CM | POA: Diagnosis not present

## 2019-04-20 DIAGNOSIS — R52 Pain, unspecified: Secondary | ICD-10-CM | POA: Diagnosis not present

## 2019-04-20 DIAGNOSIS — Z20828 Contact with and (suspected) exposure to other viral communicable diseases: Secondary | ICD-10-CM | POA: Diagnosis present

## 2019-04-20 DIAGNOSIS — Z992 Dependence on renal dialysis: Secondary | ICD-10-CM | POA: Diagnosis not present

## 2019-04-20 DIAGNOSIS — K449 Diaphragmatic hernia without obstruction or gangrene: Secondary | ICD-10-CM | POA: Diagnosis not present

## 2019-04-20 DIAGNOSIS — Z87891 Personal history of nicotine dependence: Secondary | ICD-10-CM | POA: Diagnosis not present

## 2019-04-20 DIAGNOSIS — R1031 Right lower quadrant pain: Secondary | ICD-10-CM | POA: Diagnosis present

## 2019-04-20 DIAGNOSIS — N186 End stage renal disease: Secondary | ICD-10-CM | POA: Diagnosis present

## 2019-04-20 DIAGNOSIS — G8929 Other chronic pain: Secondary | ICD-10-CM | POA: Diagnosis not present

## 2019-04-20 DIAGNOSIS — Z79899 Other long term (current) drug therapy: Secondary | ICD-10-CM | POA: Diagnosis not present

## 2019-04-20 DIAGNOSIS — E1122 Type 2 diabetes mellitus with diabetic chronic kidney disease: Secondary | ICD-10-CM | POA: Diagnosis not present

## 2019-04-20 DIAGNOSIS — D631 Anemia in chronic kidney disease: Secondary | ICD-10-CM | POA: Diagnosis not present

## 2019-04-20 DIAGNOSIS — K219 Gastro-esophageal reflux disease without esophagitis: Secondary | ICD-10-CM | POA: Diagnosis present

## 2019-04-20 DIAGNOSIS — I509 Heart failure, unspecified: Secondary | ICD-10-CM | POA: Diagnosis present

## 2019-04-20 DIAGNOSIS — R109 Unspecified abdominal pain: Secondary | ICD-10-CM | POA: Diagnosis present

## 2019-04-20 DIAGNOSIS — N2581 Secondary hyperparathyroidism of renal origin: Secondary | ICD-10-CM | POA: Diagnosis present

## 2019-04-20 DIAGNOSIS — N400 Enlarged prostate without lower urinary tract symptoms: Secondary | ICD-10-CM | POA: Diagnosis not present

## 2019-04-20 DIAGNOSIS — R0781 Pleurodynia: Secondary | ICD-10-CM | POA: Diagnosis present

## 2019-04-20 DIAGNOSIS — D469 Myelodysplastic syndrome, unspecified: Secondary | ICD-10-CM | POA: Diagnosis present

## 2019-04-20 DIAGNOSIS — R509 Fever, unspecified: Secondary | ICD-10-CM | POA: Diagnosis not present

## 2019-04-20 DIAGNOSIS — M545 Low back pain: Secondary | ICD-10-CM | POA: Diagnosis present

## 2019-04-20 DIAGNOSIS — I132 Hypertensive heart and chronic kidney disease with heart failure and with stage 5 chronic kidney disease, or end stage renal disease: Secondary | ICD-10-CM | POA: Diagnosis present

## 2019-04-20 DIAGNOSIS — I2510 Atherosclerotic heart disease of native coronary artery without angina pectoris: Secondary | ICD-10-CM | POA: Diagnosis present

## 2019-04-20 DIAGNOSIS — R079 Chest pain, unspecified: Secondary | ICD-10-CM | POA: Diagnosis not present

## 2019-04-20 DIAGNOSIS — Z955 Presence of coronary angioplasty implant and graft: Secondary | ICD-10-CM | POA: Diagnosis not present

## 2019-04-20 DIAGNOSIS — I120 Hypertensive chronic kidney disease with stage 5 chronic kidney disease or end stage renal disease: Secondary | ICD-10-CM | POA: Diagnosis not present

## 2019-04-20 LAB — COMPREHENSIVE METABOLIC PANEL
ALT: 19 U/L (ref 0–44)
AST: 41 U/L (ref 15–41)
Albumin: 3.2 g/dL — ABNORMAL LOW (ref 3.5–5.0)
Alkaline Phosphatase: 129 U/L — ABNORMAL HIGH (ref 38–126)
Anion gap: 12 (ref 5–15)
BUN: 64 mg/dL — ABNORMAL HIGH (ref 8–23)
CO2: 28 mmol/L (ref 22–32)
Calcium: 8.5 mg/dL — ABNORMAL LOW (ref 8.9–10.3)
Chloride: 100 mmol/L (ref 98–111)
Creatinine, Ser: 5.15 mg/dL — ABNORMAL HIGH (ref 0.61–1.24)
GFR calc Af Amer: 12 mL/min — ABNORMAL LOW (ref >60)
GFR calc non Af Amer: 10 mL/min — ABNORMAL LOW (ref >60)
Glucose, Bld: 130 mg/dL — ABNORMAL HIGH (ref 70–99)
Potassium: 4.8 mmol/L (ref 3.5–5.1)
Sodium: 140 mmol/L (ref 135–145)
Total Bilirubin: 0.4 mg/dL (ref 0.3–1.2)
Total Protein: 5.7 g/dL — ABNORMAL LOW (ref 6.5–8.1)

## 2019-04-20 LAB — CBC
HCT: 34.4 % — ABNORMAL LOW (ref 39.0–52.0)
Hemoglobin: 10.8 g/dL — ABNORMAL LOW (ref 13.0–17.0)
MCH: 32.4 pg (ref 26.0–34.0)
MCHC: 31.4 g/dL (ref 30.0–36.0)
MCV: 103.3 fL — ABNORMAL HIGH (ref 80.0–100.0)
Platelets: 244 10*3/uL (ref 150–400)
RBC: 3.33 MIL/uL — ABNORMAL LOW (ref 4.22–5.81)
RDW: 20.1 % — ABNORMAL HIGH (ref 11.5–15.5)
WBC: 8.1 10*3/uL (ref 4.0–10.5)
nRBC: 0.4 % — ABNORMAL HIGH (ref 0.0–0.2)

## 2019-04-20 LAB — SARS CORONAVIRUS 2 (TAT 6-24 HRS): SARS Coronavirus 2: NEGATIVE

## 2019-04-20 MED ORDER — HEPARIN SODIUM (PORCINE) 1000 UNIT/ML DIALYSIS: 3800.0000 [IU] | INTRAMUSCULAR | Status: DC | PRN

## 2019-04-20 MED ORDER — HEPARIN SODIUM (PORCINE) 1000 UNIT/ML DIALYSIS
20.0000 [IU]/kg | INTRAMUSCULAR | Status: DC | PRN
  Filled 2019-04-20: qty 2

## 2019-04-20 MED ORDER — HYDROMORPHONE HCL 1 MG/ML IJ SOLN
0.5000 mg | INTRAMUSCULAR | Status: DC | PRN
  Administered 2019-04-20 – 2019-04-21 (×6): 0.5 mg via INTRAVENOUS
  Filled 2019-04-20 (×6): qty 0.5

## 2019-04-20 MED ORDER — HYDROMORPHONE HCL 1 MG/ML IJ SOLN
1.0000 mg | INTRAMUSCULAR | Status: DC | PRN
  Administered 2019-04-20 (×3): 1 mg via INTRAVENOUS
  Filled 2019-04-20 (×3): qty 1

## 2019-04-20 MED ORDER — ATORVASTATIN CALCIUM 20 MG PO TABS
20.0000 mg | ORAL_TABLET | Freq: Every day | ORAL | Status: DC
  Administered 2019-04-20: 18:00:00 20 mg via ORAL
  Filled 2019-04-20: qty 1

## 2019-04-20 MED ORDER — GABAPENTIN 100 MG PO CAPS
100.0000 mg | ORAL_CAPSULE | Freq: Two times a day (BID) | ORAL | Status: DC
  Administered 2019-04-20 – 2019-04-21 (×2): 100 mg via ORAL
  Filled 2019-04-20 (×2): qty 1

## 2019-04-20 MED ORDER — CHLORHEXIDINE GLUCONATE CLOTH 2 % EX PADS
6.0000 | MEDICATED_PAD | Freq: Every day | CUTANEOUS | Status: DC
  Administered 2019-04-20 – 2019-04-21 (×2): 6 via TOPICAL

## 2019-04-20 MED ORDER — ASPIRIN EC 81 MG PO TBEC
81.0000 mg | DELAYED_RELEASE_TABLET | Freq: Every day | ORAL | Status: DC
  Administered 2019-04-20 – 2019-04-21 (×2): 81 mg via ORAL
  Filled 2019-04-20 (×2): qty 1

## 2019-04-20 MED ORDER — TECHNETIUM TO 99M ALBUMIN AGGREGATED
1.5000 | Freq: Once | INTRAVENOUS | Status: AC | PRN
  Administered 2019-04-20: 08:00:00 1.6 via INTRAVENOUS

## 2019-04-20 MED ORDER — METOPROLOL TARTRATE 25 MG PO TABS
25.0000 mg | ORAL_TABLET | Freq: Two times a day (BID) | ORAL | Status: DC
  Administered 2019-04-21 (×2): 25 mg via ORAL
  Filled 2019-04-20 (×2): qty 1

## 2019-04-20 MED ORDER — SODIUM CHLORIDE 0.9 % IV SOLN: 100.0000 mL | INTRAVENOUS | Status: DC | PRN

## 2019-04-20 MED ORDER — ALTEPLASE 2 MG IJ SOLR: 2.0000 mg | Freq: Once | INTRAMUSCULAR | Status: DC | PRN

## 2019-04-20 MED ORDER — METHOCARBAMOL 500 MG PO TABS
750.0000 mg | ORAL_TABLET | Freq: Four times a day (QID) | ORAL | Status: DC
  Administered 2019-04-20 – 2019-04-21 (×5): 750 mg via ORAL
  Filled 2019-04-20 (×6): qty 2

## 2019-04-20 MED ORDER — HYDROXYZINE HCL 25 MG PO TABS
25.0000 mg | ORAL_TABLET | Freq: Three times a day (TID) | ORAL | Status: DC
  Administered 2019-04-20 – 2019-04-21 (×4): 25 mg via ORAL
  Filled 2019-04-20 (×4): qty 1

## 2019-04-20 MED ORDER — HEPARIN SODIUM (PORCINE) 1000 UNIT/ML DIALYSIS
1000.0000 [IU] | INTRAMUSCULAR | Status: DC | PRN
  Filled 2019-04-20: qty 1

## 2019-04-20 NOTE — Consult Note (Signed)
[facility] Renal Consultation Note    Indication for Consultation:  Management of ESRD/hemodialysis; anemia, hypertension/volume and secondary hyperparathyroidism  HPI: [name] is a 73 y.o. male.  Pt has a PMH significant for HTN, CAD, CHF, myelodysplastic disease/myelodysplastic neoplasm cross over, splenomegaly, GERD, and ESRD who presented to [facility] ED with a 7 day history of worsening right flank pain.  CT scan for stone protocol was negative for nephrolithiasis, however the pain was severe and required IV pain medications.  CXR and covid test were negative.  He was admitted for intractable pain.  He had a low probability VQ scan.  We were consulted to help manage his ESRD and provide hemodialysis while he remains an inpatient.   Past Medical History:  Diagnosis Date  . Anxiety   . Arthritis   . BPH (benign prostatic hyperplasia)   . CAD (coronary artery disease)    DES to circumflex 07/2016, moderate residual LAD and RCA, small 90% OM3 - managed medically  . CHF (congestive heart failure) ([place])   . Chronic lower back pain   . Depression   . ESRD (end stage renal disease) ([place])    Hemo MWF [facility]  . Essential hypertension   . Gastritis   . GERD (gastroesophageal reflux disease)   . History of kidney stones   . Leukemia ([place])   . Leukocytosis   . 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 Pleural effusion    had Thorancentesis at [facility]  . Pneumonia    last time 08/2018- Aspiration Pneumonia - unintentional opiate overdose  . Renal insufficiency   . Spleen enlarged   . Thrombocythemia ([place])   . Type II diabetes mellitus ([place])    Past Surgical History:  Procedure Laterality Date  . AV FISTULA PLACEMENT Left 04/14/2019   Procedure: ARTERIOVENOUS (AV) FISTULA CREATION LEFT ARM;  Surgeon: Rosetta Posner, MD;  Location: [place];  Service: Vascular;  Laterality: Left;  .  BIOPSY  12/08/2017   Procedure: BIOPSY;  Surgeon: Danie Binder, MD;  [facility];  Service: Endoscopy;;  gastric  . BONE MARROW BIOPSY     x 3 times  . CARDIAC CATHETERIZATION N/A 08/12/2016   Procedure: Left Heart Cath and Coronary Angiography;  Surgeon: Jettie Booze, MD;  Location: [place] CV LAB;  Service: Cardiovascular;  Laterality: N/A;  . CARDIAC CATHETERIZATION N/A 08/12/2016   Procedure: Coronary Stent Intervention;  Surgeon: Jettie Booze, MD;  Location: [place] CV LAB;  Service: Cardiovascular;  Laterality: N/A;  . CARDIAC CATHETERIZATION  2000s X 1  . COLONOSCOPY WITH PROPOFOL N/A 02/01/2013   SLF: 1. 23 colon polyps removed. 2 retrieved. 2. Mild diverticulosis in teh sigmoid colon 3. Small internal hemorrhoids 4. The colon is redundant   . COLONOSCOPY WITH PROPOFOL N/A 12/08/2017   Procedure: COLONOSCOPY WITH PROPOFOL;  Surgeon: Danie Binder, MD;  [facility];  Service: Endoscopy;  Laterality: N/A;  1:45pm  . CYSTOSCOPY W/ STONE MANIPULATION    . ESOPHAGOGASTRODUODENOSCOPY (EGD) WITH PROPOFOL N/A 02/01/2013   SLF: 1. Moderate erosive gastritis  . ESOPHAGOGASTRODUODENOSCOPY (EGD) WITH PROPOFOL N/A 12/08/2017   Procedure: ESOPHAGOGASTRODUODENOSCOPY (EGD) WITH PROPOFOL;  Surgeon: Danie Binder, MD;  [facility];  Service: Endoscopy;  Laterality: N/A;  . LUMBAR [place]  . POLYPECTOMY N/A 02/01/2013   Procedure: POLYPECTOMY;  Surgeon: Danie Binder, MD;  Location: [facility];  Service: Endoscopy;  Laterality: N/A;   Family History:   Family History  Problem Relation Age of Onset  . Heart attack Mother   . CVA Mother   . Heart attack Father   . Colon cancer Neg Hx    Social History:  reports that he quit smoking about 5 years ago. His smoking use included cigars. He started smoking about 43 years ago. He has a 20.00 pack-year smoking history. He has never used smokeless tobacco. He reports previous alcohol use. He  reports that he does not use drugs. Allergies  Allergen Reactions  . Hydrocodone Itching   Prior to Admission medications   Medication Sig Start Date End Date Taking? Authorizing Provider  acetaminophen (TYLENOL) 500 MG tablet Take 1,000 mg by mouth 2 (two) times daily as needed for mild pain or moderate pain.    Yes [provider]  allopurinol (ZYLOPRIM) 100 MG tablet Take 200 mg by mouth every morning.   Yes [provider]  calcium acetate (PHOSLO) 667 MG capsule Take 667 mg by mouth 3 (three) times daily with meals.   Yes [provider]  DULoxetine (CYMBALTA) 60 MG capsule Take 60 mg by mouth daily.  04/09/16  Yes [provider]  hydrALAZINE (APRESOLINE) 50 MG tablet Take 2 tablets (100 mg total) by mouth 3 (three) times daily. 10/20/18  Yes Branch, Alphonse Guild, MD  nitroGLYCERIN (NITROSTAT) 0.4 MG SL tablet Place 1 tablet (0.4 mg total) under the tongue every 5 (five) minutes x 3 doses as needed for chest pain (if no relief after 3rd dose, proceed to the ED for an evaluation or call 911). 10/04/18  Yes BranchAlphonse Guild, MD  ruxolitinib phosphate (JAKAFI) 5 MG tablet Take 5 mg by mouth daily.   Yes [provider]  traZODone (DESYREL) 50 MG tablet Take 50 mg by mouth at bedtime as needed for sleep. 07/18/16  Yes [provider]   Current Facility-Administered Medications  Medication Dose Route Frequency Provider Last Rate Last Dose  . 0.9 %  sodium chloride infusion  250 mL Intravenous PRN Opyd, Ilene Qua, MD      . acetaminophen (TYLENOL) tablet 650 mg  650 mg Oral Q6H PRN Opyd, Ilene Qua, MD      . Chlorhexidine Gluconate Cloth 2 % PADS 6 each  6 each Topical Q0600 Donato Heinz, MD   6 each at 04/20/19 1021  . heparin injection 5,000 Units  5,000 Units Subcutaneous Q8H Opyd, Ilene Qua, MD   5,000 Units at 04/20/19 7867  . hydrALAZINE (APRESOLINE) tablet 100 mg  100 mg Oral Q8H Opyd, Ilene Qua, MD   100 mg at 04/20/19 0610  .  HYDROmorphone (DILAUDID) injection 1 mg  1 mg Intravenous Q2H PRN Emokpae, Courage, MD   1 mg at 04/20/19 1019  . sodium chloride flush (NS) 0.9 % injection 3 mL  3 mL Intravenous Q12H Opyd, Ilene Qua, MD   3 mL at 04/20/19 0928  . sodium chloride flush (NS) 0.9 % injection 3 mL  3 mL Intravenous Q12H Opyd, Ilene Qua, MD   3 mL at 04/20/19 0928  . sodium chloride flush (NS) 0.9 % injection 3 mL  3 mL Intravenous PRN Opyd, Ilene Qua, MD      . traZODone (DESYREL) tablet 50 mg  50 mg Oral QHS PRN Opyd, Ilene Qua, MD   50 mg at 04/20/19 0017   Labs: Basic Metabolic Panel: Recent Labs  Lab 04/14/19 0720  04/19/19 1851 04/20/19 0542  NA 139 138 140  K 4.0 4.6 4.8  CL 99 104 100  CO2  --  23 28  GLUCOSE 141* 92 130*  BUN 38* 63* 64*  CREATININE 4.40* 4.90* 5.15*  CALCIUM  --  8.4* 8.5*   Liver Function Tests: Recent Labs  Lab 04/19/19 1851 04/20/19 0542  AST 48* 41  ALT 24 19  ALKPHOS 143* 129*  BILITOT 0.5 0.4  PROT 6.4* 5.7*  ALBUMIN 3.5 3.2*   Recent Labs  Lab 04/19/19 1851  LIPASE 19   No results for input(s): AMMONIA in the last 168 hours. CBC: Recent Labs  Lab 04/14/19 0720 04/19/19 1851 04/20/19 0542  WBC  --  10.1 8.1  HGB 12.9* 11.4* 10.8*  HCT 38.0* 34.7* 34.4*  MCV  --  98.3 103.3*  PLT  --  281 244   Cardiac Enzymes: No results for input(s): CKTOTAL, CKMB, CKMBINDEX, TROPONINI in the last 168 hours. CBG: Recent Labs  Lab 04/14/19 0704 04/14/19 1039  GLUCAP 152* 110*   Iron Studies: No results for input(s): IRON, TIBC, TRANSFERRIN, FERRITIN in the last 72 hours. Studies/Results: Dg Chest 2 View  Result Date: 04/19/2019 CLINICAL DATA:  Shortness of breath. EXAM: CHEST - 2 VIEW COMPARISON:  Radiographs of May 16, 2018. FINDINGS: The heart size and mediastinal contours are within normal limits. Both lungs are clear. No pneumothorax or pleural effusion is noted. Right internal jugular dialysis catheter is noted with tip in expected position of  cavoatrial junction. The visualized skeletal structures are unremarkable. IMPRESSION: No active cardiopulmonary disease. Electronically Signed   By: Marijo Conception M.D.   On: 04/19/2019 20:05   Ct Chest Wo Contrast  Result Date: 04/20/2019 CLINICAL DATA:  Chest pain. EXAM: CT CHEST WITHOUT CONTRAST TECHNIQUE: Multidetector CT imaging of the chest was performed following the standard protocol without IV contrast. COMPARISON:  CT scan of March 05, 2012. FINDINGS: Cardiovascular: Atherosclerosis of thoracic aorta is noted without aneurysm formation. Normal cardiac size is noted. Coronary artery calcifications are noted. No pericardial effusion is noted. Right internal jugular dialysis catheter is noted with distal tip in the right atrium. Mediastinum/Nodes: Small sliding-type hiatal hernia is noted. Thyroid gland is unremarkable. Interval development of 9 mm right paratracheal lymph node which most likely is infectious or reactive in etiology. Lungs/Pleura: Lungs are clear. No pleural effusion or pneumothorax. Upper Abdomen: Severe splenomegaly is noted which is more prominent compared to prior exam. Musculoskeletal: No fracture is noted. There does appear to be slightly increased sclerosis involving the visualized ribs and spine consistent with history of end-stage renal disease. IMPRESSION: Small sliding-type hiatal hernia. No acute pulmonary disease is noted. Severe splenomegaly is noted. Increased sclerosis is seen involving the visualized ribs and spine consistent with history of end-stage renal disease. Coronary artery calcifications are noted. Aortic Atherosclerosis (ICD10-I70.0). Electronically Signed   By: Marijo Conception M.D.   On: 04/20/2019 10:55   Nm Pulmonary Perfusion  Result Date: 04/20/2019 CLINICAL DATA:  Elevated D-dimer, RIGHT-side back pain, dialysis fistula implanted 04/14/2019, history coronary artery disease, CHF, end-stage renal disease, hypertension, mild dysplastic syndrome EXAM:  NUCLEAR MEDICINE PERFUSION LUNG SCAN TECHNIQUE: Perfusion images were obtained in multiple projections after intravenous injection of radiopharmaceutical. Ventilation scans intentionally deferred if perfusion scan and chest x-ray adequate for interpretation during COVID 19 epidemic. RADIOPHARMACEUTICALS:  1.6 mCi Tc-81mMAA IV COMPARISON:  Chest radiograph 04/19/2019 FINDINGS: Normal perfusion distribution throughout both lungs. No perfusion defects. Chest radiograph demonstrates  clear lungs. IMPRESSION: Normal perfusion lung scan. Electronically Signed   By: Lavonia Dana M.D.   On: 04/20/2019 08:53   Ct Renal Stone Study  Result Date: 04/19/2019 CLINICAL DATA:  Flank pain, history of urolithiasis is you have there is EXAM: CT ABDOMEN AND PELVIS WITHOUT CONTRAST TECHNIQUE: Multidetector CT imaging of the abdomen and pelvis was performed following the standard protocol without IV contrast. COMPARISON:  CT March 08, 2018 FINDINGS: Lower chest: There are bibasilar atelectatic changes. Lung bases are otherwise clear. Catheter tip projects at the level of the right atrium. Atherosclerotic calcification of the coronary arteries. Calcifications are present on the aortic leaflets. Hepatobiliary: Mild hepatomegaly. Stable punctate calcification along the falciform ligament. No concerning focal liver abnormality is seen. No gallstones, gallbladder wall thickening, or biliary dilatation. Pancreas: Unremarkable. No pancreatic ductal dilatation or surrounding inflammatory changes. Spleen: Markedly enlarged spleen with few punctate calcifications. No concerning splenic lesions. Adrenals/Urinary Tract: Normal adrenal glands. Mild bilateral symmetric perinephric stranding, a nonspecific finding though may correlate with either age or decreased renal function. Kidneys are otherwise unremarkable, without renal calculi, suspicious lesion, or hydronephrosis. Bladder wall thickening is greater than expected for the degree of  underdistention with some perivesicular stranding. Stomach/Bowel: Small hiatal hernia. Esophagus and stomach are otherwise unremarkable. Duodenal sweep is normal course and caliber. Distal esophagus, stomach and duodenal sweep are unremarkable. No bowel wall thickening or dilatation. No evidence of obstruction. A normal appendix is visualized. No colonic dilatation or wall thickening. Vascular/Lymphatic: Extensive atherosclerotic calcification of the aorta. Luminal evaluation is limited in the absence of contrast media. No suspicious or enlarged lymph nodes in the included lymphatic chains. Reproductive: The prostate and seminal vesicles are unremarkable. Other: No abdominopelvic free fluid or free gas. No bowel containing hernias. Bilateral inguinal hernias. Injection granulomata are present as well as a large stable dystrophic calcification in the left gluteal soft tissues. Musculoskeletal: Bones are diffusely demineralized. Multilevel degenerative changes are present in the imaged portions of the spine. Postsurgical changes from prior L4, L5 posterior decompression. No acute osseous abnormality or suspicious osseous lesion. IMPRESSION: 1. No evidence of urolithiasis or hydronephrosis. 2. Bladder wall thickening is greater than expected for the degree of underdistention with some perivesicular stranding. Correlate with urinalysis to exclude cystitis. 3. Stable hepatosplenomegaly. 4. Prior L4-5 posterior decompression. 5. Coronary atherosclerosis. 6. Aortic Atherosclerosis (ICD10-I70.0). Electronically Signed   By: Lovena Le M.D.   On: 04/19/2019 20:25    ROS: Pertinent items are noted in HPI. Physical Exam: Vitals:   04/20/19 0030 04/20/19 0106 04/20/19 0510 04/20/19 0859  BP: (!) 170/93 (!) 159/81 (!) 159/77   Pulse: 88 95 72   Resp: _0 Temp:  99 F (37.2 C) 98.2 F (36.8 C)   TempSrc:  Oral Oral   SpO2: 91% 95% 97% 96%  Weight:  88.4 kg    Height:  6' (1.829 m)        Weight  change:   Intake/Output Summary (Last 24 hours) at 04/20/2019 1119 Last data filed at 04/20/2019 0900 Gross per 24 hour  Intake 120 ml  Output -  Net 120 ml   BP (!) 159/77 (BP Location: Right Arm)   Pulse 72   Temp 98.2 F (36.8 C) (Oral)   Resp 20   Ht 6' (1.829 m)   Wt 88.4 kg   SpO2 96%   BMI 26.43 kg/m  General appearance: alert, cooperative and mild distress Head: Normocephalic, without obvious abnormality, atraumatic Resp: clear  to auscultation bilaterally Cardio: regular rate and rhythm, S1, S2 normal, no murmur, click, rub or gallop GI: soft, non-tender; bowel sounds normal; no masses,  no organomegaly Back: + CVA tenderness and right rib pain upon palpation Extremities: extremities normal, atraumatic, no cyanosis or edema and L cimino avf +T/B, also IV in left antecubital region Dialysis Access:  Dialysis Orders: [facility]  on MWF . EDW 83kg HD Bath 2K/2.5Ca  Time 4:15 Heparin 800 bolus then 400 every hour. Access RIJ tdc, L avf placed 04/14/19 BFR 300 DFR 600    Hectoral 1 mcg IV/HD Epogen 1200  Units IV/HD  Venofer  50 mg q Monday    Assessment/Plan: 1.  Intractable right flank pain- agree with CT scan of chest for further evaluation.  Possibly related to his severe splenomegaly.  Would consult surgery 2.  ESRD -  Plan for HD today.  He does have history of cramping with HD and used low BFR/DFr to help 3.  Hypertension/volume  -  stable 4.  Anemia  - stable 5.  Metabolic bone disease -  Cont with home meds 6.  Nutrition -  Renal diet  Donetta Potts, MD [place] Pager [phone] 04/20/2019, 11:19 AM

## 2019-04-20 NOTE — Progress Notes (Signed)
Patient Demographics:    [name], is a 73 y.o. male, DOB - [birth year], SEG:[id number]  Admit date - 04/19/2019   Admitting Physician Alan Bulls, MD  Outpatient Primary MD for the patient is Alan Blitz, MD  Alan - 0   Chief Complaint  Patient presents with   Shortness of Breath        Subjective:    [name] today has no fevers, no emesis, complains of right rib cage/flank pain rates the pain at about 10/10  no productive cough  Assessment  & Plan :    Principal Problem:   Intractable pain Active Problems:   Pleuritic pain   ESRD on hemodialysis ([name])   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[name])   Essential hypertension   Elevated d-dimer  Brief Summary -73 y.o. male with medical history significant for myelodysplasia/myeloproliferative overlap disorder, CAD, ESRD on hemodialysis, HTN, and chronic back pain, Admitted on 04/19/2019 with intractable right-sided rib cage and flank pain  A/p 1) severe/intractable right flank and rib cage pain--- patient continues to rate pain at 10/10, requiring IV fentanyl or Dilaudid frequently -PTA patient was on hydrocodone for chronic back pain, and methocarbamol, hydroxyzine and gabapentin -Unable to discharge due to patient still requiring high-dose frequent IV opiates -VQ scan low probability for pulmonary embolism -CT chest without contrast without acute findings -CT stone protocol without acute findings  2)CAD- [facility] Jan 2018, received DES to LCX. 90% OM3 small vessel, treated medically. 60% RCA medically managed -No left-sided chest pain no ACS type symptoms at this time -Treat empirically with aspirin and low-dose metoprolol 25 mg twice daily,, patient did have bradycardia in the past from high-dose metoprolo-Previously was taken off aspirin for thrombocytopenia, platelets currently 244K -Lipitor 20 mg daily .  3)ESRD--continue  hemodialysis on Monday Wednesdays and Fridays nephrology input appreciated --  Medication Issue:- Preferred narcotic agents for pain control are hydromorphone, fentanyl, and methadone. Morphine should not be used.  Baclofen should be avoided Avoid oral sodium phosphate and magnesium citrate based laxatives / bowel preps     4)myelodysplasia/myeloproliferative overlap disorder--- CBC noted, follows with oncology   Disposition/Need for in-Hospital Stay- patient unable to be discharged at this time due to Unable to discharge due to patient still requiring high-dose frequent IV opiates   Code Status : Full  Family Communication:   NA (patient is alert, awake and coherent)   Disposition Plan  : home   Consults  :  *Nephrology for hemodialysis  DVT Prophylaxis  :    - Heparin - SCDs  Lab Results  Component Value Date   PLT 244 04/20/2019    Inpatient Medications  Scheduled Meds:  Chlorhexidine Gluconate Cloth  6 each Topical Q0600   heparin  5,000 Units Subcutaneous Q8H   hydrALAZINE  100 mg Oral Q8H   sodium chloride flush  3 mL Intravenous Q12H   sodium chloride flush  3 mL Intravenous Q12H   Continuous Infusions:  sodium chloride     PRN Meds:.sodium chloride, acetaminophen, HYDROmorphone (DILAUDID) injection, sodium chloride flush, traZODone    Anti-infectives (From admission, onward)   None        Objective:   Vitals:   04/20/19 0030 04/20/19 0106 04/20/19 0510 04/20/19 0859  BP: Marland Kitchen)  170/93 (!) 159/81 (!) 159/77   Pulse: 88 95 72   Resp: 18 18 20    Temp:  99 F (37.2 C) 98.2 F (36.8 C)   TempSrc:  Oral Oral   SpO2: 91% 95% 97% 96%  Weight:  88.4 kg    Height:  6' (1.829 m)      Wt Readings from Last 3 Encounters:  04/20/19 88.4 kg  04/14/19 84.8 kg  03/15/19 83.5 kg     Intake/Output Summary (Last 24 hours) at 04/20/2019 1217 Last data filed at 04/20/2019 0900 Gross per 24 hour  Intake 120 ml  Output --  Net 120 ml     Physical  Exam  Gen:- Awake Alert,   HEENT:- [place].AT, No sclera icterus Neck-Supple Neck,No JVD,.  Right IJ HD catheter Lungs-  CTAB , fair symmetrical air movement CV- S1, S2 normal, regular  Abd-  +ve B.Sounds, Abd Soft, right lower rib area as well as right flank area with significant tenderness on palpation, overlying skin without erythema warmth or swelling Extremity/Skin:- No  edema, pedal pulses present  Psych-affect is appropriate, oriented x3 Neuro-no new focal deficits, no tremors MSK-left upper extremity with AV fistula with positive thrill and bruit   Data Review:   Micro Results Recent Results (from the past 240 hour(s))  SARS CORONAVIRUS 2 (TAT 6-24 HRS) Nasopharyngeal Nasopharyngeal Swab     Status: None   Collection Time: 04/12/19  7:13 AM   Specimen: Nasopharyngeal Swab  Result Value Ref Range Status   SARS Coronavirus 2 NEGATIVE NEGATIVE Final    Comment: (NOTE) SARS-CoV-2 target nucleic acids are NOT DETECTED. The SARS-CoV-2 RNA is generally detectable in upper and lower respiratory specimens during the acute phase of infection. Negative results do not preclude SARS-CoV-2 infection, do not rule out co-infections with other pathogens, and should not be used as the sole basis for treatment or other patient management decisions. Negative results must be combined with clinical observations, patient history, and epidemiological information. The expected result is Negative. Fact Sheet for Patients: SugarRoll.be Fact Sheet for Healthcare Providers: https://www.woods-mathews.com/ This test is not yet approved or cleared by the [place] FDA and  has been authorized for detection and/or diagnosis of SARS-CoV-2 by FDA under an Emergency Use Authorization (EUA). This EUA will remain  in effect (meaning this test can be used) for the duration of the COVID-19 declaration under Section 56 4(b)(1) of the Act, 21 U.S.C. section  360bbb-3(b)(1), unless the authorization is terminated or revoked sooner. Performed at [facility], [place] [address], [place], [place] [address]     Radiology Reports Dg Chest 2 View  Result Date: 04/19/2019 CLINICAL DATA:  Shortness of breath. EXAM: CHEST - 2 VIEW COMPARISON:  Radiographs of May 16, 2018. FINDINGS: The heart size and mediastinal contours are within normal limits. Both lungs are clear. No pneumothorax or pleural effusion is noted. Right internal jugular dialysis catheter is noted with tip in expected position of cavoatrial junction. The visualized skeletal structures are unremarkable. IMPRESSION: No active cardiopulmonary disease. Electronically Signed   By: Marijo Conception M.D.   On: 04/19/2019 20:05   Ct Chest Wo Contrast  Result Date: 04/20/2019 CLINICAL DATA:  Chest pain. EXAM: CT CHEST WITHOUT CONTRAST TECHNIQUE: Multidetector CT imaging of the chest was performed following the standard protocol without IV contrast. COMPARISON:  CT scan of March 05, 2012. FINDINGS: Cardiovascular: Atherosclerosis of thoracic aorta is noted without aneurysm formation. Normal cardiac size is noted. Coronary artery calcifications  are noted. No pericardial effusion is noted. Right internal jugular dialysis catheter is noted with distal tip in the right atrium. Mediastinum/Nodes: Small sliding-type hiatal hernia is noted. Thyroid gland is unremarkable. Interval development of 9 mm right paratracheal lymph node which most likely is infectious or reactive in etiology. Lungs/Pleura: Lungs are clear. No pleural effusion or pneumothorax. Upper Abdomen: Severe splenomegaly is noted which is more prominent compared to prior exam. Musculoskeletal: No fracture is noted. There does appear to be slightly increased sclerosis involving the visualized ribs and spine consistent with history of end-stage renal disease. IMPRESSION: Small sliding-type hiatal hernia. No acute pulmonary disease is noted. Severe  splenomegaly is noted. Increased sclerosis is seen involving the visualized ribs and spine consistent with history of end-stage renal disease. Coronary artery calcifications are noted. Aortic Atherosclerosis (ICD10-I70.0). Electronically Signed   By: Marijo Conception M.D.   On: 04/20/2019 10:55   Nm Pulmonary Perfusion  Result Date: 04/20/2019 CLINICAL DATA:  Elevated D-dimer, RIGHT-side back pain, dialysis fistula implanted 04/14/2019, history coronary artery disease, CHF, end-stage renal disease, hypertension, mild dysplastic syndrome EXAM: NUCLEAR MEDICINE PERFUSION LUNG SCAN TECHNIQUE: Perfusion images were obtained in multiple projections after intravenous injection of radiopharmaceutical. Ventilation scans intentionally deferred if perfusion scan and chest x-ray adequate for interpretation during COVID 19 epidemic. RADIOPHARMACEUTICALS:  1.6 mCi Tc-56m MAA IV COMPARISON:  Chest radiograph 04/19/2019 FINDINGS: Normal perfusion distribution throughout both lungs. No perfusion defects. Chest radiograph demonstrates clear lungs. IMPRESSION: Normal perfusion lung scan. Electronically Signed   By: Lavonia Dana M.D.   On: 04/20/2019 08:53   Ct Renal Stone Study  Result Date: 04/19/2019 CLINICAL DATA:  Flank pain, history of urolithiasis is you have there is EXAM: CT ABDOMEN AND PELVIS WITHOUT CONTRAST TECHNIQUE: Multidetector CT imaging of the abdomen and pelvis was performed following the standard protocol without IV contrast. COMPARISON:  CT March 08, 2018 FINDINGS: Lower chest: There are bibasilar atelectatic changes. Lung bases are otherwise clear. Catheter tip projects at the level of the right atrium. Atherosclerotic calcification of the coronary arteries. Calcifications are present on the aortic leaflets. Hepatobiliary: Mild hepatomegaly. Stable punctate calcification along the falciform ligament. No concerning focal liver abnormality is seen. No gallstones, gallbladder wall thickening, or biliary  dilatation. Pancreas: Unremarkable. No pancreatic ductal dilatation or surrounding inflammatory changes. Spleen: Markedly enlarged spleen with few punctate calcifications. No concerning splenic lesions. Adrenals/Urinary Tract: Normal adrenal glands. Mild bilateral symmetric perinephric stranding, a nonspecific finding though may correlate with either age or decreased renal function. Kidneys are otherwise unremarkable, without renal calculi, suspicious lesion, or hydronephrosis. Bladder wall thickening is greater than expected for the degree of underdistention with some perivesicular stranding. Stomach/Bowel: Small hiatal hernia. Esophagus and stomach are otherwise unremarkable. Duodenal sweep is normal course and caliber. Distal esophagus, stomach and duodenal sweep are unremarkable. No bowel wall thickening or dilatation. No evidence of obstruction. A normal appendix is visualized. No colonic dilatation or wall thickening. Vascular/Lymphatic: Extensive atherosclerotic calcification of the aorta. Luminal evaluation is limited in the absence of contrast media. No suspicious or enlarged lymph nodes in the included lymphatic chains. Reproductive: The prostate and seminal vesicles are unremarkable. Other: No abdominopelvic free fluid or free gas. No bowel containing hernias. Bilateral inguinal hernias. Injection granulomata are present as well as a large stable dystrophic calcification in the left gluteal soft tissues. Musculoskeletal: Bones are diffusely demineralized. Multilevel degenerative changes are present in the imaged portions of the spine. Postsurgical changes from prior L4, L5 posterior decompression. No acute  osseous abnormality or suspicious osseous lesion. IMPRESSION: 1. No evidence of urolithiasis or hydronephrosis. 2. Bladder wall thickening is greater than expected for the degree of underdistention with some perivesicular stranding. Correlate with urinalysis to exclude cystitis. 3. Stable  hepatosplenomegaly. 4. Prior L4-5 posterior decompression. 5. Coronary atherosclerosis. 6. Aortic Atherosclerosis (ICD10-I70.0). Electronically Signed   By: Lovena Le M.D.   On: 04/19/2019 20:25    CBC Recent Labs  Lab 04/14/19 0720 04/19/19 1851 04/20/19 0542  WBC  --  10.1 8.1  HGB 12.9* 11.4* 10.8*  HCT 38.0* 34.7* 34.4*  PLT  --  281 244  MCV  --  98.3 103.3*  MCH  --  32.3 32.4  MCHC  --  32.9 31.4  RDW  --  19.9* 20.1*    Chemistries  Recent Labs  Lab 04/14/19 0720 04/19/19 1851 04/20/19 0542  NA 139 138 140  K 4.0 4.6 4.8  CL 99 104 100  CO2  --  23 28  GLUCOSE 141* 92 130*  BUN 38* 63* 64*  CREATININE 4.40* 4.90* 5.15*  CALCIUM  --  8.4* 8.5*  AST  --  48* 41  ALT  --  24 19  ALKPHOS  --  143* 129*  BILITOT  --  0.5 0.4   ------------------------------------------------------------------------------------------------------------------ No results for input(s): CHOL, HDL, LDLCALC, TRIG, CHOLHDL, LDLDIRECT in the last 72 hours.  Lab Results  Component Value Date   HGBA1C 6.0 (H) 04/26/2016   ------------------------------------------------------------------------------------------------------------------ No results for input(s): TSH, T4TOTAL, T3FREE, THYROIDAB in the last 72 hours.  Invalid input(s): FREET3 ------------------------------------------------------------------------------------------------------------------ No results for input(s): VITAMINB12, FOLATE, FERRITIN, TIBC, IRON, RETICCTPCT in the last 72 hours.  Coagulation profile No results for input(s): INR, PROTIME in the last 168 hours.  Recent Labs    04/19/19 1851  DDIMER 0.90*    Cardiac Enzymes No results for input(s): CKMB, TROPONINI, MYOGLOBIN in the last 168 hours.  Invalid input(s): CK ------------------------------------------------------------------------------------------------------------------    Component Value Date/Time   BNP 111.0 (H) 08/08/2017 1338    Roxan Hockey M.D on 04/20/2019 at 12:17 PM  Go to [email or web address] - for contact info  [facility] - Office  [phone]

## 2019-04-20 NOTE — Progress Notes (Signed)
Dr. Roxan Hockey made aware of patient's continued high pain level after 2 doses of dilaudid, robaxin, and vistaril given.  Patient noted to laying in bed watching TV.

## 2019-04-20 NOTE — Procedures (Signed)
    HEMODIALYSIS TREATMENT NOTE:  3.5 hour low-heparin dialysis completed via right chest wall tunneled catheter. Exit site unremarkable. Goal met: 1 liter removed without interruption in ultrafiltration.  All blood was returned.  Rockwell Alexandria, RN

## 2019-04-21 LAB — URINALYSIS, ROUTINE W REFLEX MICROSCOPIC
Bacteria, UA: NONE SEEN
Bilirubin Urine: NEGATIVE
Glucose, UA: 50 mg/dL — AB
Hgb urine dipstick: NEGATIVE
Ketones, ur: NEGATIVE mg/dL
Leukocytes,Ua: NEGATIVE
Nitrite: NEGATIVE
Protein, ur: >=300 mg/dL — AB
Specific Gravity, Urine: 1.026 (ref 1.005–1.030)
pH: 6 (ref 5.0–8.0)

## 2019-04-21 LAB — CBC
HCT: 36.4 % — ABNORMAL LOW (ref 39.0–52.0)
Hemoglobin: 11.9 g/dL — ABNORMAL LOW (ref 13.0–17.0)
MCH: 32.5 pg (ref 26.0–34.0)
MCHC: 32.7 g/dL (ref 30.0–36.0)
MCV: 99.5 fL (ref 80.0–100.0)
Platelets: 287 10*3/uL (ref 150–400)
RBC: 3.66 MIL/uL — ABNORMAL LOW (ref 4.22–5.81)
RDW: 20.1 % — ABNORMAL HIGH (ref 11.5–15.5)
WBC: 10.3 10*3/uL (ref 4.0–10.5)
nRBC: 0.8 % — ABNORMAL HIGH (ref 0.0–0.2)

## 2019-04-21 MED ORDER — METHOCARBAMOL 750 MG PO TABS: 750.0000 mg | ORAL_TABLET | Freq: Three times a day (TID) | ORAL | 0 refills | Status: DC

## 2019-04-21 MED ORDER — TRAZODONE HCL 50 MG PO TABS: 50.0000 mg | ORAL_TABLET | Freq: Every evening | ORAL | 3 refills | Status: AC | PRN

## 2019-04-21 MED ORDER — GABAPENTIN 100 MG PO CAPS: 100.0000 mg | ORAL_CAPSULE | Freq: Every day | ORAL | 1 refills | Status: DC

## 2019-04-21 MED ORDER — FAMOTIDINE 20 MG PO TABS
20.0000 mg | ORAL_TABLET | Freq: Every day | ORAL | Status: DC
  Administered 2019-04-21: 20 mg via ORAL
  Filled 2019-04-21: qty 1

## 2019-04-21 MED ORDER — METOPROLOL TARTRATE 25 MG PO TABS: 25.0000 mg | ORAL_TABLET | Freq: Two times a day (BID) | ORAL | 3 refills | Status: DC

## 2019-04-21 MED ORDER — ASPIRIN 81 MG PO TBEC: 81.0000 mg | DELAYED_RELEASE_TABLET | Freq: Every day | ORAL | 2 refills | Status: DC

## 2019-04-21 MED ORDER — DULOXETINE HCL 60 MG PO CPEP: 60.0000 mg | ORAL_CAPSULE | Freq: Every day | ORAL | 3 refills | Status: AC

## 2019-04-21 MED ORDER — HYDROXYZINE HCL 25 MG PO TABS: 25.0000 mg | ORAL_TABLET | Freq: Three times a day (TID) | ORAL | 0 refills | Status: DC | PRN

## 2019-04-21 NOTE — Discharge Summary (Signed)
[name], is a 73 y.o. male  DOB [birth year]  MRN [id number].  Admission date:  04/19/2019  Admitting Physician  Vianne Bulls, MD  Discharge Date:  04/21/2019   Primary MD  [name], MD  Recommendations for primary care physician for things to follow:   - 1) continue pain medications as prescribed by your regular doctor 2) continue hemodialysis on Mondays Wednesdays and Fridays 3) please see your doctor for recheck and reevaluation of feel right-sided flank pain within the next week 4) please call us back at [phone] on Friday, 04/22/2019-to get preliminary report on your blood and urine cultures that were obtained during your hospital stay  Admission Diagnosis  Pleuritic pain [R07.81] Flank pain [R10.9] Elevated d-dimer [R79.89]   Discharge Diagnosis  Pleuritic pain [R07.81] Flank pain [R10.9] Elevated d-dimer [R79.89]    Principal Problem:   Intractable pain Active Problems:   Pleuritic pain   ESRD on hemodialysis ([place])   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[place])   Essential hypertension   Elevated d-dimer      Past Medical History:  Diagnosis Date   Anxiety    Arthritis    BPH (benign prostatic hyperplasia)    CAD (coronary artery disease)    DES to circumflex 07/2016, moderate residual LAD and RCA, small 90% OM3 - managed medically   CHF (congestive heart failure) (HCC)    Chronic lower back pain    Depression    ESRD (end stage renal disease) ([place])    Hemo MWF [facility]   Essential hypertension    Gastritis    GERD (gastroesophageal reflux disease)    History of kidney stones    Leukemia (HCC)    Leukocytosis    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[place])    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[place])    Likely MDS/MPN, unclassifiable - folowed at [facility]   Pleural effusion    had Thorancentesis at [facility]   Pneumonia    last time 08/2018- Aspiration Pneumonia -  unintentional opiate overdose   Renal insufficiency    Spleen enlarged    Thrombocythemia ([place])    Type II diabetes mellitus ([place])     Past Surgical History:  Procedure Laterality Date   AV FISTULA PLACEMENT Left 04/14/2019   Procedure: ARTERIOVENOUS (AV) FISTULA CREATION LEFT ARM;  Surgeon: Rosetta Posner, MD;  Location: [place];  Service: Vascular;  Laterality: Left;   BIOPSY  12/08/2017   Procedure: BIOPSY;  Surgeon: Danie Binder, MD;  [facility];  Service: Endoscopy;;  gastric   BONE MARROW BIOPSY     x 3 times   CARDIAC CATHETERIZATION N/A 08/12/2016   Procedure: Left Heart Cath and Coronary Angiography;  Surgeon: Jettie Booze, MD;  Location: [place] [facility];  Service: Cardiovascular;  Laterality: N/A;   CARDIAC CATHETERIZATION N/A 08/12/2016   Procedure: Coronary Stent Intervention;  Surgeon: Jettie Booze, MD;  Location: [place] [facility];  Service: Cardiovascular;  Laterality: N/A;   CARDIAC CATHETERIZATION  2000s X 1   COLONOSCOPY WITH  PROPOFOL N/A 02/01/2013   SLF: 1. 23 colon polyps removed. 2 retrieved. 2. Mild diverticulosis in teh sigmoid colon 3. Small internal hemorrhoids 4. The colon is redundant    COLONOSCOPY WITH PROPOFOL N/A 12/08/2017   Procedure: COLONOSCOPY WITH PROPOFOL;  Surgeon: Danie Binder, MD;  [facility];  Service: Endoscopy;  Laterality: N/A;  1:45pm   CYSTOSCOPY W/ STONE MANIPULATION     ESOPHAGOGASTRODUODENOSCOPY (EGD) WITH PROPOFOL N/A 02/01/2013   SLF: 1. Moderate erosive gastritis   ESOPHAGOGASTRODUODENOSCOPY (EGD) WITH PROPOFOL N/A 12/08/2017   Procedure: ESOPHAGOGASTRODUODENOSCOPY (EGD) WITH PROPOFOL;  Surgeon: Danie Binder, MD;  [facility];  Service: Endoscopy;  Laterality: N/A;   [place]   POLYPECTOMY N/A 02/01/2013   Procedure: POLYPECTOMY;  Surgeon: Danie Binder, MD;  Location[facility];  Service: Endoscopy;  Laterality: N/A;       HPI  from the  history and physical done on the day of admission:   -  Patient coming from: Home   Chief Complaint: Right flank pain, severe, worse with deep breath   HPI: [name] is a 73 y.o. male with medical history significant for myelodysplasia/myeloproliferative overlap disorder, coronary artery disease, ESRD on hemodialysis, hypertension, and chronic back pain, now presenting to emergency department for evaluation of severe right flank pain.  Patient reports that he developed pain in his right flank 2 weeks ago without any trauma or inciting event that he could identify.  Pain is fairly constant, much worse with deep inspiration.  Also worse with palpation.  He reports a history of kidney stones.  Denies any fevers or chills.  Reports that he has been dyspneic for well over a month and that has not really changed much.  He denies any significant cough.  Denies chest pain.  Denies any lower extremity swelling or tenderness, and denies any history of DVT or PE.  No hematuria.  ED Course: Upon arrival to the ED, patient is found to be saturating well on room air, and with stable blood pressure.  EKG features of sinus rhythm with PVCs and nonspecific ST-T abnormality.  Chest x-ray is negative for acute cardiopulmonary disease.  CT renal stone study is negative for urolithiasis or hydronephrosis, but notable for bladder wall thickening with perivesicular stranding.  Chemistry panel features a potassium 4.6, bicarbonate 23, and BUN 63.  CBC notable for a mild normocytic anemia.  D-dimer elevated to 0.90.  Patient was treated with fentanyl, Dilaudid, Robaxin, and Tylenol in the ED.  COVID-19 testing is in process.  Hospitalists asked to admit for intractable pain and possible VQ scan in the morning.     Hospital Course:    -Brief Summary -72 y.o.malewith medical history significant formyelodysplasia/myeloproliferative overlap disorder, CAD, ESRD on hemodialysis, HTN, and chronic back pain, Admitted  on 04/19/2019 with intractable right-sided rib cage and flank pain  A/p 1) severe/intractable right flank and rib cage pain--- patient continues to rate pain at 10/10, -Patient initially required IV fentanyl and then  Dilaudid frequently -PTA patient was on hydrocodone for chronic back pain, and methocarbamol, hydroxyzine and gabapentin --VQ scan low probability for pulmonary embolism -CT chest without contrast without acute findings -CT stone protocol without acute findings -Pain improved , suspect musculoskeletal etiology  2)CAD- [facility] Jan 2018, received DES to LCX. 90% OM3 small vessel, treated medically. 60% RCA medically managed -No left-sided chest pain no ACS type symptoms at this time Treated with aspirin and low-dose metoprolol 25 mg  twice daily,, patient did have bradycardia in the past from high-dose metoprolo-Previously was taken off aspirin for thrombocytopenia, platelets currentl > 200K -Lipitor 20 mg daily .  3)ESRD--continue hemodialysis on Monday Wednesdays and Fridays nephrology input appreciated --  Medication Issue:- Preferred narcotic agents for pain control are hydromorphone, fentanyl, and methadone. Morphine should not be used.  Baclofen should be avoided Avoid oral sodium phosphate and magnesium citrate based laxatives / bowel preps    4)myelodysplasia/myeloproliferative overlap disorder--- CBC noted, follows with oncology   Disposition-discharged home in stable condition and continue outpatient HD    Code Status : Full  Family Communication:   (patient is alert, awake and coherent) -Discussed with patient's daughter at patient's request  Disposition Plan  : home   Consults  :  *Nephrology for hemodialysis   Discharge Condition: Stable  Follow UP----nephrologist for hemodialysis treatments 3 times a week as per schedule  Diet and Activity recommendation:  As advised  Discharge Instructions     Discharge Instructions    Call MD for:   difficulty breathing, headache or visual disturbances   Complete by: As directed    Call MD for:  persistant dizziness or light-headedness   Complete by: As directed    Call MD for:  persistant nausea and vomiting   Complete by: As directed    Call MD for:  severe uncontrolled pain   Complete by: As directed    Call MD for:  temperature >100.4   Complete by: As directed    Diet - low sodium heart healthy   Complete by: As directed    Discharge instructions   Complete by: As directed    1) continue pain medications as prescribed by your regular doctor 2) continue hemodialysis on Mondays Wednesdays and Fridays 3) please see your doctor for recheck and reevaluation of feel right-sided flank pain within the next week 4) please call us back at [phone] on Friday, 04/22/2019-to get preliminary report on your blood and urine cultures that were obtained during your hospital stay   Increase activity slowly   Complete by: As directed         Discharge Medications     Allergies as of 04/21/2019      Reactions   Hydrocodone Itching      Medication List    TAKE these medications   acetaminophen 500 MG tablet Commonly known as: TYLENOL Take 1,000 mg by mouth 2 (two) times daily as needed for mild pain or moderate pain.   allopurinol 100 MG tablet Commonly known as: ZYLOPRIM Take 200 mg by mouth every morning.   aspirin 81 MG EC tablet Take 1 tablet (81 mg total) by mouth daily with breakfast.   DULoxetine 60 MG capsule Commonly known as: CYMBALTA Take 1 capsule (60 mg total) by mouth daily.   gabapentin 100 MG capsule Commonly known as: NEURONTIN Take 1 capsule (100 mg total) by mouth at bedtime.   hydrALAZINE 50 MG tablet Commonly known as: APRESOLINE Take 2 tablets (100 mg total) by mouth 3 (three) times daily. What changed: how much to take   hydrOXYzine 25 MG tablet Commonly known as: ATARAX/VISTARIL Take 1 tablet (25 mg total) by mouth every 8 (eight) hours as  needed for anxiety, itching or nausea.   methocarbamol 750 MG tablet Commonly known as: ROBAXIN Take 1 tablet (750 mg total) by mouth 3 (three) times daily. For muscle pain/spasm   metoprolol tartrate 25 MG tablet Commonly known as: LOPRESSOR Take 1 tablet (25 mg  total) by mouth 2 (two) times daily.   nitroGLYCERIN 0.4 MG SL tablet Commonly known as: NITROSTAT Place 1 tablet (0.4 mg total) under the tongue every 5 (five) minutes x 3 doses as needed for chest pain (if no relief after 3rd dose, proceed to the ED for an evaluation or call 911).   PhosLo 667 MG capsule Generic drug: calcium acetate Take 667 mg by mouth 3 (three) times daily with meals.   ruxolitinib phosphate 5 MG tablet Commonly known as: JAKAFI Take 5 mg by mouth daily.   traZODone 50 MG tablet Commonly known as: DESYREL Take 1 tablet (50 mg total) by mouth at bedtime as needed for sleep.      Major procedures and Radiology Reports - PLEASE review detailed and final reports for all details, in brief -    Dg Chest 2 View  Result Date: 04/19/2019 CLINICAL DATA:  Shortness of breath. EXAM: CHEST - 2 VIEW COMPARISON:  Radiographs of May 16, 2018. FINDINGS: The heart size and mediastinal contours are within normal limits. Both lungs are clear. No pneumothorax or pleural effusion is noted. Right internal jugular dialysis catheter is noted with tip in expected position of cavoatrial junction. The visualized skeletal structures are unremarkable. IMPRESSION: No active cardiopulmonary disease. Electronically Signed   By: Marijo Conception M.D.   On: 04/19/2019 20:05   Ct Chest Wo Contrast  Result Date: 04/20/2019 CLINICAL DATA:  Chest pain. EXAM: CT CHEST WITHOUT CONTRAST TECHNIQUE: Multidetector CT imaging of the chest was performed following the standard protocol without IV contrast. COMPARISON:  CT scan of March 05, 2012. FINDINGS: Cardiovascular: Atherosclerosis of thoracic aorta is noted without aneurysm formation.  Normal cardiac size is noted. Coronary artery calcifications are noted. No pericardial effusion is noted. Right internal jugular dialysis catheter is noted with distal tip in the right atrium. Mediastinum/Nodes: Small sliding-type hiatal hernia is noted. Thyroid gland is unremarkable. Interval development of 9 mm right paratracheal lymph node which most likely is infectious or reactive in etiology. Lungs/Pleura: Lungs are clear. No pleural effusion or pneumothorax. Upper Abdomen: Severe splenomegaly is noted which is more prominent compared to prior exam. Musculoskeletal: No fracture is noted. There does appear to be slightly increased sclerosis involving the visualized ribs and spine consistent with history of end-stage renal disease. IMPRESSION: Small sliding-type hiatal hernia. No acute pulmonary disease is noted. Severe splenomegaly is noted. Increased sclerosis is seen involving the visualized ribs and spine consistent with history of end-stage renal disease. Coronary artery calcifications are noted. Aortic Atherosclerosis (ICD10-I70.0). Electronically Signed   By: Marijo Conception M.D.   On: 04/20/2019 10:55   Nm Pulmonary Perfusion  Result Date: 04/20/2019 CLINICAL DATA:  Elevated D-dimer, RIGHT-side back pain, dialysis fistula implanted 04/14/2019, history coronary artery disease, CHF, end-stage renal disease, hypertension, mild dysplastic syndrome EXAM: NUCLEAR MEDICINE PERFUSION LUNG SCAN TECHNIQUE: Perfusion images were obtained in multiple projections after intravenous injection of radiopharmaceutical. Ventilation scans intentionally deferred if perfusion scan and chest x-ray adequate for interpretation during COVID 19 epidemic. RADIOPHARMACEUTICALS:  1.6 mCi Tc-19mMAA IV COMPARISON:  Chest radiograph 04/19/2019 FINDINGS: Normal perfusion distribution throughout both lungs. No perfusion defects. Chest radiograph demonstrates clear lungs. IMPRESSION: Normal perfusion lung scan. Electronically Signed    By: MLavonia DanaM.D.   On: 04/20/2019 08:53   Ct Renal Stone Study  Result Date: 04/19/2019 CLINICAL DATA:  Flank pain, history of urolithiasis is you have there is EXAM: CT ABDOMEN AND PELVIS WITHOUT CONTRAST TECHNIQUE: Multidetector CT imaging  of the abdomen and pelvis was performed following the standard protocol without IV contrast. COMPARISON:  CT March 08, 2018 FINDINGS: Lower chest: There are bibasilar atelectatic changes. Lung bases are otherwise clear. Catheter tip projects at the level of the right atrium. Atherosclerotic calcification of the coronary arteries. Calcifications are present on the aortic leaflets. Hepatobiliary: Mild hepatomegaly. Stable punctate calcification along the falciform ligament. No concerning focal liver abnormality is seen. No gallstones, gallbladder wall thickening, or biliary dilatation. Pancreas: Unremarkable. No pancreatic ductal dilatation or surrounding inflammatory changes. Spleen: Markedly enlarged spleen with few punctate calcifications. No concerning splenic lesions. Adrenals/Urinary Tract: Normal adrenal glands. Mild bilateral symmetric perinephric stranding, a nonspecific finding though may correlate with either age or decreased renal function. Kidneys are otherwise unremarkable, without renal calculi, suspicious lesion, or hydronephrosis. Bladder wall thickening is greater than expected for the degree of underdistention with some perivesicular stranding. Stomach/Bowel: Small hiatal hernia. Esophagus and stomach are otherwise unremarkable. Duodenal sweep is normal course and caliber. Distal esophagus, stomach and duodenal sweep are unremarkable. No bowel wall thickening or dilatation. No evidence of obstruction. A normal appendix is visualized. No colonic dilatation or wall thickening. Vascular/Lymphatic: Extensive atherosclerotic calcification of the aorta. Luminal evaluation is limited in the absence of contrast media. No suspicious or enlarged lymph nodes in  the included lymphatic chains. Reproductive: The prostate and seminal vesicles are unremarkable. Other: No abdominopelvic free fluid or free gas. No bowel containing hernias. Bilateral inguinal hernias. Injection granulomata are present as well as a large stable dystrophic calcification in the left gluteal soft tissues. Musculoskeletal: Bones are diffusely demineralized. Multilevel degenerative changes are present in the imaged portions of the spine. Postsurgical changes from prior L4, L5 posterior decompression. No acute osseous abnormality or suspicious osseous lesion. IMPRESSION: 1. No evidence of urolithiasis or hydronephrosis. 2. Bladder wall thickening is greater than expected for the degree of underdistention with some perivesicular stranding. Correlate with urinalysis to exclude cystitis. 3. Stable hepatosplenomegaly. 4. Prior L4-5 posterior decompression. 5. Coronary atherosclerosis. 6. Aortic Atherosclerosis (ICD10-I70.0). Electronically Signed   By: Lovena Le M.D.   On: 04/19/2019 20:25    Micro Results     Recent Results (from the past 240 hour(s))  SARS CORONAVIRUS 2 (TAT 6-24 HRS) Nasopharyngeal Nasopharyngeal Swab     Status: None   Collection Time: 04/12/19  7:13 AM   Specimen: Nasopharyngeal Swab  Result Value Ref Range Status   SARS Coronavirus 2 NEGATIVE NEGATIVE Final    Comment: (NOTE) SARS-CoV-2 target nucleic acids are NOT DETECTED. The SARS-CoV-2 RNA is generally detectable in upper and lower respiratory specimens during the acute phase of infection. Negative results do not preclude SARS-CoV-2 infection, do not rule out co-infections with other pathogens, and should not be used as the sole basis for treatment or other patient management decisions. Negative results must be combined with clinical observations, patient history, and epidemiological information. The expected result is Negative. Fact Sheet for Patients: SugarRoll.be Fact  Sheet for Healthcare Providers: https://www.woods-mathews.com/ This test is not yet approved or cleared by the [place] FDA and  has been authorized for detection and/or diagnosis of SARS-CoV-2 by FDA under an Emergency Use Authorization (EUA). This EUA will remain  in effect (meaning this test can be used) for the duration of the COVID-19 declaration under Section 56 4(b)(1) of the Act, 21 U.S.C. section 360bbb-3(b)(1), unless the authorization is terminated or revoked sooner. Performed at [facility], [place] [address], [place], [place] [address]   SARS CORONAVIRUS  2 (TAT 6-24 HRS) Nasopharyngeal Nasopharyngeal Swab     Status: None   Collection Time: 04/19/19 10:32 PM   Specimen: Nasopharyngeal Swab  Result Value Ref Range Status   SARS Coronavirus 2 NEGATIVE NEGATIVE Final    Comment: (NOTE) SARS-CoV-2 target nucleic acids are NOT DETECTED. The SARS-CoV-2 RNA is generally detectable in upper and lower respiratory specimens during the acute phase of infection. Negative results do not preclude SARS-CoV-2 infection, do not rule out co-infections with other pathogens, and should not be used as the sole basis for treatment or other patient management decisions. Negative results must be combined with clinical observations, patient history, and epidemiological information. The expected result is Negative. Fact Sheet for Patients: SugarRoll.be Fact Sheet for Healthcare Providers: https://www.woods-mathews.com/ This test is not yet approved or cleared by the [place] FDA and  has been authorized for detection and/or diagnosis of SARS-CoV-2 by FDA under an Emergency Use Authorization (EUA). This EUA will remain  in effect (meaning this test can be used) for the duration of the COVID-19 declaration under Section 56 4(b)(1) of the Act, 21 U.S.C. section 360bbb-3(b)(1), unless the authorization is terminated or revoked  sooner. Performed at [facility], [place] [address] [place], [place] [address]   Culture, blood (Routine X 2) w Reflex to ID Panel     Status: None (Preliminary result)   Collection Time: 04/21/19  8:30 AM   Specimen: BLOOD  Result Value Ref Range Status   Specimen Description BLOOD DRAWN BY RN DIALYSIS CATHETER  Final   Special Requests   Final    BOTTLES DRAWN AEROBIC AND ANAEROBIC Blood Culture results may not be optimal due to an inadequate volume of blood received in culture bottles Performed at [facility], [address], [place], [place] [address]    Culture PENDING  Incomplete   Report Status PENDING  Incomplete  Culture, blood (Routine X 2) w Reflex to ID Panel     Status: None (Preliminary result)   Collection Time: 04/21/19  9:10 AM   Specimen: BLOOD RIGHT HAND  Result Value Ref Range Status   Specimen Description BLOOD RIGHT HAND  Final   Special Requests   Final    BOTTLES DRAWN AEROBIC AND ANAEROBIC Blood Culture adequate volume Performed at [facility], [address], [place], [place] [address]    Culture PENDING  Incomplete   Report Status PENDING  Incomplete    Today   Subjective    [name] today has no complaints, flank pain is improved,  -Ambulating around, no dyspnea on exertion, no hypoxia, no dizziness or palpitation with ambulation,          Patient has been seen and examined prior to discharge   Objective   Blood pressure 137/68, pulse 79, temperature 99.8 F (37.7 C), temperature source Oral, resp. rate 19, height 6' (1.829 m), weight 88.3 kg, SpO2 94 %.   Intake/Output Summary (Last 24 hours) at 04/21/2019 1459 Last data filed at 04/21/2019 1300 Gross per 24 hour  Intake 240 ml  Output 1300 ml  Net -1060 ml    Exam Gen:- Awake Alert,   HEENT:- [place].AT, No sclera icterus Neck-Supple Neck,No JVD,.  Right IJ HD catheter Lungs-  CTAB , fair symmetrical air movement CV- S1, S2 normal, regular  Abd-  +ve B.Sounds, Abd Soft, right  lower rib area as well as right flank area with much improved reproducible tenderness on palpation, overlying skin without erythema warmth or swelling Extremity/Skin:- No  edema, pedal pulses present  Psych-affect is appropriate, oriented x3 Neuro-no new focal deficits, no tremors MSK-left upper extremity with AV fistula with positive thrill and bruit   Data Review   CBC w Diff:  Lab Results  Component Value Date   WBC 10.3 04/21/2019   HGB 11.9 (L) 04/21/2019   HCT 36.4 (L) 04/21/2019   PLT 287 04/21/2019   LYMPHOPCT 21 05/16/2018   BANDSPCT 10 08/08/2017   MONOPCT 9 05/16/2018   EOSPCT 1 05/16/2018   BASOPCT 4 05/16/2018    CMP:  Lab Results  Component Value Date   NA 140 04/20/2019   NA 137 11/04/2012   K 4.8 04/20/2019   K 3.9 11/04/2012   CL 100 04/20/2019   CL 100 11/04/2012   CO2 28 04/20/2019   CO2 28 11/04/2012   BUN 64 (H) 04/20/2019   CREATININE 5.15 (H) 04/20/2019   PROT 5.7 (L) 04/20/2019   PROT 6.9 11/04/2012   ALBUMIN 3.2 (L) 04/20/2019   ALBUMIN 4.4 11/04/2012   BILITOT 0.4 04/20/2019   BILITOT 0.7 11/04/2012   ALKPHOS 129 (H) 04/20/2019   ALKPHOS 58 11/04/2012   AST 41 04/20/2019   AST 27 11/04/2012   ALT 19 04/20/2019  .   Total Discharge time is about 33 minutes  Roxan Hockey M.D on 04/21/2019 at 2:59 PM  Go to [email or web address] -  for contact info  [facility] - Office  [phone]

## 2019-04-21 NOTE — Discharge Instructions (Signed)
1) continue pain medications as prescribed by your regular doctor 2) continue hemodialysis on Mondays Wednesdays and Fridays 3) please see your doctor for recheck and reevaluation of feel right-sided flank pain within the next week 4) please call us back at [phone] on Friday, 04/22/2019-to get preliminary report on your blood and urine cultures that were obtained during your hospital stay

## 2019-04-21 NOTE — Progress Notes (Signed)
Patient ID: [name], male   DOB: November 01, 1945, 73 y.o.   MRN: [id number] S: STill reporting 8/10 pain but lying in bed in NAD O:BP 134/69 (BP Location: Right Arm)   Pulse 79   Temp 99.8 F (37.7 C) (Oral)   Resp 16   Ht 6' (1.829 m)   Wt 88.3 kg   SpO2 91%   BMI 26.40 kg/m   Intake/Output Summary (Last 24 hours) at 04/21/2019 2440 Last data filed at 04/21/2019 0000 Gross per 24 hour  Intake 240 ml  Output 1000 ml  Net -760 ml   Intake/Output: I/O last 3 completed shifts: In: 360 [P.O.:360] Out: 1000 [Other:1000]  Intake/Output this shift:  No intake/output data recorded. Weight change: 3.024 kg Gen: NAD CVS: no rub Resp: cta Abd: +BS, +tenderness in RUQ with guarding, no rebound Ext: no edema, LAVF +T/B  Recent Labs  Lab 04/19/19 1851 04/20/19 0542  NA 138 140  K 4.6 4.8  CL 104 100  CO2 23 28  GLUCOSE 92 130*  BUN 63* 64*  CREATININE 4.90* 5.15*  ALBUMIN 3.5 3.2*  CALCIUM 8.4* 8.5*  AST 48* 41  ALT 24 19   Liver Function Tests: Recent Labs  Lab 04/19/19 1851 04/20/19 0542  AST 48* 41  ALT 24 19  ALKPHOS 143* 129*  BILITOT 0.5 0.4  PROT 6.4* 5.7*  ALBUMIN 3.5 3.2*   Recent Labs  Lab 04/19/19 1851  LIPASE 19   No results for input(s): AMMONIA in the last 168 hours. CBC: Recent Labs  Lab 04/19/19 1851 04/20/19 0542 04/21/19 0909  WBC 10.1 8.1 10.3  HGB 11.4* 10.8* 11.9*  HCT 34.7* 34.4* 36.4*  MCV 98.3 103.3* 99.5  PLT 281 244 287   Cardiac Enzymes: No results for input(s): CKTOTAL, CKMB, CKMBINDEX, TROPONINI in the last 168 hours. CBG: Recent Labs  Lab 04/14/19 1039  GLUCAP 110*    Iron Studies: No results for input(s): IRON, TIBC, TRANSFERRIN, FERRITIN in the last 72 hours. Studies/Results: Dg Chest 2 View  Result Date: 04/19/2019 CLINICAL DATA:  Shortness of breath. EXAM: CHEST - 2 VIEW COMPARISON:  Radiographs of May 16, 2018. FINDINGS: The heart size and mediastinal contours are within normal limits. Both lungs are  clear. No pneumothorax or pleural effusion is noted. Right internal jugular dialysis catheter is noted with tip in expected position of cavoatrial junction. The visualized skeletal structures are unremarkable. IMPRESSION: No active cardiopulmonary disease. Electronically Signed   By: Marijo Conception M.D.   On: 04/19/2019 20:05   Ct Chest Wo Contrast  Result Date: 04/20/2019 CLINICAL DATA:  Chest pain. EXAM: CT CHEST WITHOUT CONTRAST TECHNIQUE: Multidetector CT imaging of the chest was performed following the standard protocol without IV contrast. COMPARISON:  CT scan of March 05, 2012. FINDINGS: Cardiovascular: Atherosclerosis of thoracic aorta is noted without aneurysm formation. Normal cardiac size is noted. Coronary artery calcifications are noted. No pericardial effusion is noted. Right internal jugular dialysis catheter is noted with distal tip in the right atrium. Mediastinum/Nodes: Small sliding-type hiatal hernia is noted. Thyroid gland is unremarkable. Interval development of 9 mm right paratracheal lymph node which most likely is infectious or reactive in etiology. Lungs/Pleura: Lungs are clear. No pleural effusion or pneumothorax. Upper Abdomen: Severe splenomegaly is noted which is more prominent compared to prior exam. Musculoskeletal: No fracture is noted. There does appear to be slightly increased sclerosis involving the visualized ribs and spine consistent with history of end-stage renal disease. IMPRESSION: Small sliding-type hiatal  hernia. No acute pulmonary disease is noted. Severe splenomegaly is noted. Increased sclerosis is seen involving the visualized ribs and spine consistent with history of end-stage renal disease. Coronary artery calcifications are noted. Aortic Atherosclerosis (ICD10-I70.0). Electronically Signed   By: Marijo Conception M.D.   On: 04/20/2019 10:55   Nm Pulmonary Perfusion  Result Date: 04/20/2019 CLINICAL DATA:  Elevated D-dimer, RIGHT-side back pain, dialysis  fistula implanted 04/14/2019, history coronary artery disease, CHF, end-stage renal disease, hypertension, mild dysplastic syndrome EXAM: NUCLEAR MEDICINE PERFUSION LUNG SCAN TECHNIQUE: Perfusion images were obtained in multiple projections after intravenous injection of radiopharmaceutical. Ventilation scans intentionally deferred if perfusion scan and chest x-ray adequate for interpretation during COVID 19 epidemic. RADIOPHARMACEUTICALS:  1.6 mCi Tc-58m MAA IV COMPARISON:  Chest radiograph 04/19/2019 FINDINGS: Normal perfusion distribution throughout both lungs. No perfusion defects. Chest radiograph demonstrates clear lungs. IMPRESSION: Normal perfusion lung scan. Electronically Signed   By: Lavonia Dana M.D.   On: 04/20/2019 08:53   Ct Renal Stone Study  Result Date: 04/19/2019 CLINICAL DATA:  Flank pain, history of urolithiasis is you have there is EXAM: CT ABDOMEN AND PELVIS WITHOUT CONTRAST TECHNIQUE: Multidetector CT imaging of the abdomen and pelvis was performed following the standard protocol without IV contrast. COMPARISON:  CT March 08, 2018 FINDINGS: Lower chest: There are bibasilar atelectatic changes. Lung bases are otherwise clear. Catheter tip projects at the level of the right atrium. Atherosclerotic calcification of the coronary arteries. Calcifications are present on the aortic leaflets. Hepatobiliary: Mild hepatomegaly. Stable punctate calcification along the falciform ligament. No concerning focal liver abnormality is seen. No gallstones, gallbladder wall thickening, or biliary dilatation. Pancreas: Unremarkable. No pancreatic ductal dilatation or surrounding inflammatory changes. Spleen: Markedly enlarged spleen with few punctate calcifications. No concerning splenic lesions. Adrenals/Urinary Tract: Normal adrenal glands. Mild bilateral symmetric perinephric stranding, a nonspecific finding though may correlate with either age or decreased renal function. Kidneys are otherwise  unremarkable, without renal calculi, suspicious lesion, or hydronephrosis. Bladder wall thickening is greater than expected for the degree of underdistention with some perivesicular stranding. Stomach/Bowel: Small hiatal hernia. Esophagus and stomach are otherwise unremarkable. Duodenal sweep is normal course and caliber. Distal esophagus, stomach and duodenal sweep are unremarkable. No bowel wall thickening or dilatation. No evidence of obstruction. A normal appendix is visualized. No colonic dilatation or wall thickening. Vascular/Lymphatic: Extensive atherosclerotic calcification of the aorta. Luminal evaluation is limited in the absence of contrast media. No suspicious or enlarged lymph nodes in the included lymphatic chains. Reproductive: The prostate and seminal vesicles are unremarkable. Other: No abdominopelvic free fluid or free gas. No bowel containing hernias. Bilateral inguinal hernias. Injection granulomata are present as well as a large stable dystrophic calcification in the left gluteal soft tissues. Musculoskeletal: Bones are diffusely demineralized. Multilevel degenerative changes are present in the imaged portions of the spine. Postsurgical changes from prior L4, L5 posterior decompression. No acute osseous abnormality or suspicious osseous lesion. IMPRESSION: 1. No evidence of urolithiasis or hydronephrosis. 2. Bladder wall thickening is greater than expected for the degree of underdistention with some perivesicular stranding. Correlate with urinalysis to exclude cystitis. 3. Stable hepatosplenomegaly. 4. Prior L4-5 posterior decompression. 5. Coronary atherosclerosis. 6. Aortic Atherosclerosis (ICD10-I70.0). Electronically Signed   By: Lovena Le M.D.   On: 04/19/2019 20:25   . aspirin EC  81 mg Oral Daily  . atorvastatin  20 mg Oral q1800  . Chlorhexidine Gluconate Cloth  6 each Topical Q0600  . gabapentin  100 mg Oral BID  .  heparin  5,000 Units Subcutaneous Q8H  . hydrALAZINE  100 mg  Oral Q8H  . hydrOXYzine  25 mg Oral TID  . methocarbamol  750 mg Oral QID  . metoprolol tartrate  25 mg Oral BID  . sodium chloride flush  3 mL Intravenous Q12H  . sodium chloride flush  3 mL Intravenous Q12H    BMET    Component Value Date/Time   NA 140 04/20/2019 0542   NA 137 11/04/2012   K 4.8 04/20/2019 0542   K 3.9 11/04/2012   CL 100 04/20/2019 0542   CL 100 11/04/2012   CO2 28 04/20/2019 0542   CO2 28 11/04/2012   GLUCOSE 130 (H) 04/20/2019 0542   BUN 64 (H) 04/20/2019 0542   CREATININE 5.15 (H) 04/20/2019 0542   CALCIUM 8.5 (L) 04/20/2019 0542   CALCIUM 9.1 11/04/2012   GFRNONAA 10 (L) 04/20/2019 0542   GFRAA 12 (L) 04/20/2019 0542   CBC    Component Value Date/Time   WBC 10.3 04/21/2019 0909   RBC 3.66 (L) 04/21/2019 0909   HGB 11.9 (L) 04/21/2019 0909   HCT 36.4 (L) 04/21/2019 0909   PLT 287 04/21/2019 0909   MCV 99.5 04/21/2019 0909   MCH 32.5 04/21/2019 0909   MCHC 32.7 04/21/2019 0909   RDW 20.1 (H) 04/21/2019 0909   LYMPHSABS 1.8 05/16/2018 1822   MONOABS 0.8 05/16/2018 1822   EOSABS 0.1 05/16/2018 1822   BASOSABS 0.4 (H) 05/16/2018 1822     Dialysis Orders: Center: [facility]  on MWF . EDW 83kg HD Bath 2K/2.5Ca  Time 4:15 Heparin 800 bolus then 400 every hour. Access RIJ tdc, L avf placed 04/14/19 BFR 300 DFR 600    Hectoral 1 mcg IV/HD Epogen 1200  Units IV/HD  Venofer  50 mg q Monday    Assessment/Plan: 1.  Intractable right flank pain- no identifiable etiology of his pain and requiring frequent and large doses of narcotics.  Has chronic pain and takes opioids regularly 2. Fever- new this am.  Agree with blood cultures and empiric abx which can be continued with outpatient HD.   3.  ESRD -  Continue with MWF schedule while he remains an inpatient.  He does have history of cramping with HD and used low BFR/DFr to help 4.  Hypertension/volume  -  stable 5.  Anemia  - stable 6.  Metabolic bone disease -  Cont with home meds 7.  Nutrition -   Renal diet 8. Disposition- per primary svc Donetta Potts, MD [facility] [phone]

## 2019-04-22 LAB — URINE CULTURE: Culture: NO GROWTH

## 2019-04-26 LAB — CULTURE, BLOOD (ROUTINE X 2)
Culture: NO GROWTH
Culture: NO GROWTH
Special Requests: ADEQUATE

## 2019-04-27 DIAGNOSIS — E559 Vitamin D deficiency, unspecified: Secondary | ICD-10-CM | POA: Diagnosis not present

## 2019-04-27 DIAGNOSIS — Z992 Dependence on renal dialysis: Secondary | ICD-10-CM | POA: Diagnosis not present

## 2019-04-27 DIAGNOSIS — N186 End stage renal disease: Secondary | ICD-10-CM | POA: Diagnosis not present

## 2019-04-27 DIAGNOSIS — D631 Anemia in chronic kidney disease: Secondary | ICD-10-CM | POA: Diagnosis not present

## 2019-04-27 DIAGNOSIS — D509 Iron deficiency anemia, unspecified: Secondary | ICD-10-CM | POA: Diagnosis not present

## 2019-04-28 DIAGNOSIS — Z299 Encounter for prophylactic measures, unspecified: Secondary | ICD-10-CM | POA: Diagnosis not present

## 2019-04-28 DIAGNOSIS — N183 Chronic kidney disease, stage 3 unspecified: Secondary | ICD-10-CM | POA: Diagnosis not present

## 2019-04-28 DIAGNOSIS — I10 Essential (primary) hypertension: Secondary | ICD-10-CM | POA: Diagnosis not present

## 2019-04-28 DIAGNOSIS — E1122 Type 2 diabetes mellitus with diabetic chronic kidney disease: Secondary | ICD-10-CM | POA: Diagnosis not present

## 2019-04-28 DIAGNOSIS — E1165 Type 2 diabetes mellitus with hyperglycemia: Secondary | ICD-10-CM | POA: Diagnosis not present

## 2019-04-28 DIAGNOSIS — Z6829 Body mass index (BMI) 29.0-29.9, adult: Secondary | ICD-10-CM | POA: Diagnosis not present

## 2019-04-28 DIAGNOSIS — C9590 Leukemia, unspecified not having achieved remission: Secondary | ICD-10-CM | POA: Diagnosis not present

## 2019-04-29 DIAGNOSIS — D631 Anemia in chronic kidney disease: Secondary | ICD-10-CM | POA: Diagnosis not present

## 2019-04-29 DIAGNOSIS — D509 Iron deficiency anemia, unspecified: Secondary | ICD-10-CM | POA: Diagnosis not present

## 2019-04-29 DIAGNOSIS — E559 Vitamin D deficiency, unspecified: Secondary | ICD-10-CM | POA: Diagnosis not present

## 2019-04-29 DIAGNOSIS — Z992 Dependence on renal dialysis: Secondary | ICD-10-CM | POA: Diagnosis not present

## 2019-04-29 DIAGNOSIS — N186 End stage renal disease: Secondary | ICD-10-CM | POA: Diagnosis not present

## 2019-05-02 DIAGNOSIS — E559 Vitamin D deficiency, unspecified: Secondary | ICD-10-CM | POA: Diagnosis not present

## 2019-05-02 DIAGNOSIS — Z992 Dependence on renal dialysis: Secondary | ICD-10-CM | POA: Diagnosis not present

## 2019-05-02 DIAGNOSIS — D631 Anemia in chronic kidney disease: Secondary | ICD-10-CM | POA: Diagnosis not present

## 2019-05-02 DIAGNOSIS — D509 Iron deficiency anemia, unspecified: Secondary | ICD-10-CM | POA: Diagnosis not present

## 2019-05-02 DIAGNOSIS — N186 End stage renal disease: Secondary | ICD-10-CM | POA: Diagnosis not present

## 2019-05-04 DIAGNOSIS — Z992 Dependence on renal dialysis: Secondary | ICD-10-CM | POA: Diagnosis not present

## 2019-05-04 DIAGNOSIS — D631 Anemia in chronic kidney disease: Secondary | ICD-10-CM | POA: Diagnosis not present

## 2019-05-04 DIAGNOSIS — E559 Vitamin D deficiency, unspecified: Secondary | ICD-10-CM | POA: Diagnosis not present

## 2019-05-04 DIAGNOSIS — N186 End stage renal disease: Secondary | ICD-10-CM | POA: Diagnosis not present

## 2019-05-04 DIAGNOSIS — D509 Iron deficiency anemia, unspecified: Secondary | ICD-10-CM | POA: Diagnosis not present

## 2019-05-06 DIAGNOSIS — Z87442 Personal history of urinary calculi: Secondary | ICD-10-CM | POA: Diagnosis not present

## 2019-05-06 DIAGNOSIS — I10 Essential (primary) hypertension: Secondary | ICD-10-CM | POA: Diagnosis not present

## 2019-05-06 DIAGNOSIS — E119 Type 2 diabetes mellitus without complications: Secondary | ICD-10-CM | POA: Diagnosis not present

## 2019-05-06 DIAGNOSIS — R109 Unspecified abdominal pain: Secondary | ICD-10-CM | POA: Diagnosis not present

## 2019-05-11 ENCOUNTER — Emergency Department (HOSPITAL_COMMUNITY): Payer: Medicare Other

## 2019-05-11 ENCOUNTER — Emergency Department (HOSPITAL_COMMUNITY)
Admission: EM | Admit: 2019-05-11 | Discharge: 2019-05-11 | Disposition: A | Discharge status: Home / Self Care | Payer: Medicare Other | Attending: Emergency Medicine | Admitting: Emergency Medicine

## 2019-05-11 ENCOUNTER — Encounter (HOSPITAL_COMMUNITY): Payer: Self-pay

## 2019-05-11 ENCOUNTER — Other Ambulatory Visit: Payer: Self-pay

## 2019-05-11 DIAGNOSIS — Z992 Dependence on renal dialysis: Secondary | ICD-10-CM | POA: Diagnosis not present

## 2019-05-11 DIAGNOSIS — J810 Acute pulmonary edema: Secondary | ICD-10-CM | POA: Diagnosis not present

## 2019-05-11 DIAGNOSIS — Z79899 Other long term (current) drug therapy: Secondary | ICD-10-CM | POA: Insufficient documentation

## 2019-05-11 DIAGNOSIS — E559 Vitamin D deficiency, unspecified: Secondary | ICD-10-CM | POA: Diagnosis not present

## 2019-05-11 DIAGNOSIS — Z87891 Personal history of nicotine dependence: Secondary | ICD-10-CM | POA: Diagnosis not present

## 2019-05-11 DIAGNOSIS — I509 Heart failure, unspecified: Secondary | ICD-10-CM | POA: Insufficient documentation

## 2019-05-11 DIAGNOSIS — R0602 Shortness of breath: Secondary | ICD-10-CM | POA: Diagnosis not present

## 2019-05-11 DIAGNOSIS — D631 Anemia in chronic kidney disease: Secondary | ICD-10-CM | POA: Diagnosis not present

## 2019-05-11 DIAGNOSIS — I120 Hypertensive chronic kidney disease with stage 5 chronic kidney disease or end stage renal disease: Secondary | ICD-10-CM | POA: Diagnosis not present

## 2019-05-11 DIAGNOSIS — D509 Iron deficiency anemia, unspecified: Secondary | ICD-10-CM | POA: Diagnosis not present

## 2019-05-11 DIAGNOSIS — I132 Hypertensive heart and chronic kidney disease with heart failure and with stage 5 chronic kidney disease, or end stage renal disease: Secondary | ICD-10-CM | POA: Insufficient documentation

## 2019-05-11 DIAGNOSIS — N186 End stage renal disease: Secondary | ICD-10-CM | POA: Diagnosis not present

## 2019-05-11 DIAGNOSIS — Z7982 Long term (current) use of aspirin: Secondary | ICD-10-CM | POA: Diagnosis not present

## 2019-05-11 LAB — CBC WITH DIFFERENTIAL/PLATELET
Abs Immature Granulocytes: 1.96 10*3/uL — ABNORMAL HIGH (ref 0.00–0.07)
Basophils Absolute: 0.1 10*3/uL (ref 0.0–0.1)
Basophils Relative: 2 %
Eosinophils Absolute: 0 10*3/uL (ref 0.0–0.5)
Eosinophils Relative: 0 %
HCT: 30.8 % — ABNORMAL LOW (ref 39.0–52.0)
Hemoglobin: 10 g/dL — ABNORMAL LOW (ref 13.0–17.0)
Immature Granulocytes: 27 %
Lymphocytes Relative: 11 %
Lymphs Abs: 0.8 10*3/uL (ref 0.7–4.0)
MCH: 32.4 pg (ref 26.0–34.0)
MCHC: 32.5 g/dL (ref 30.0–36.0)
MCV: 99.7 fL (ref 80.0–100.0)
Monocytes Absolute: 1.4 10*3/uL — ABNORMAL HIGH (ref 0.1–1.0)
Monocytes Relative: 19 %
Neutro Abs: 3 10*3/uL (ref 1.7–7.7)
Neutrophils Relative %: 41 %
Platelets: 237 10*3/uL (ref 150–400)
RBC: 3.09 MIL/uL — ABNORMAL LOW (ref 4.22–5.81)
RDW: 19.5 % — ABNORMAL HIGH (ref 11.5–15.5)
WBC: 7.3 10*3/uL (ref 4.0–10.5)
nRBC: 0.7 % — ABNORMAL HIGH (ref 0.0–0.2)

## 2019-05-11 LAB — BASIC METABOLIC PANEL
Anion gap: 12 (ref 5–15)
BUN: 59 mg/dL — ABNORMAL HIGH (ref 8–23)
CO2: 20 mmol/L — ABNORMAL LOW (ref 22–32)
Calcium: 8.1 mg/dL — ABNORMAL LOW (ref 8.9–10.3)
Chloride: 107 mmol/L (ref 98–111)
Creatinine, Ser: 4.91 mg/dL — ABNORMAL HIGH (ref 0.61–1.24)
GFR calc Af Amer: 13 mL/min — ABNORMAL LOW (ref >60)
GFR calc non Af Amer: 11 mL/min — ABNORMAL LOW (ref >60)
Glucose, Bld: 126 mg/dL — ABNORMAL HIGH (ref 70–99)
Potassium: 3.8 mmol/L (ref 3.5–5.1)
Sodium: 139 mmol/L (ref 135–145)

## 2019-05-11 NOTE — ED Triage Notes (Signed)
Pt reports has missed 3 dialysis treatments, last treatment was 1 1/2 weeks ago.  Pt says he missed his treatment because he didn't feel well.  States he feels "full."  Pt says Dialysis sent him here for evaluation before they will perform his dialysis.  Denies cough or fever.  Reports sob.  Denies n/v/d.

## 2019-05-11 NOTE — Discharge Instructions (Addendum)
Go to your dialysis center now. °

## 2019-05-11 NOTE — ED Provider Notes (Signed)
[facility] EMERGENCY DEPARTMENT Provider Note   CSN: [id number] Arrival date & time: 05/11/19  0375     History   Chief Complaint Chief Complaint  Patient presents with  . missed dialysis    HPI [name] is a 73 y.o. male.     Pt presents to the ED today with not feeling well.  Pt missed a total of 3 dialysis treatments because he did not feel well.  He went to dialysis today and was sent here for evaluation before they do the dialysis.  Pt does feel sob, but no f/c.  Pt did not take any of his meds this morning.  He feels like he is "full" today.     Past Medical History:  Diagnosis Date  . Anxiety   . Arthritis   . BPH (benign prostatic hyperplasia)   . CAD (coronary artery disease)    DES to circumflex 07/2016, moderate residual LAD and RCA, small 90% OM3 - managed medically  . CHF (congestive heart failure) ([place])   . Chronic lower back pain   . Depression   . ESRD (end stage renal disease) ([place])    Hemo MWF [facility]  . Essential hypertension   . Gastritis   . GERD (gastroesophageal reflux disease)   . History of kidney stones   . Leukemia ([place])   . Leukocytosis   . 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 Pleural effusion    had Thorancentesis at [facility]  . Pneumonia    last time 08/2018- Aspiration Pneumonia - unintentional opiate overdose  . Renal insufficiency   . Spleen enlarged   . Thrombocythemia ([place])   . Type II diabetes mellitus [facility])     Patient Active Problem List   Diagnosis Date Noted  . Intractable pain 04/19/2019  . Elevated d-dimer 04/19/2019  . RUQ abdominal pain 11/18/2017  . Acute CHF (congestive heart failure) ([place]) 09/15/2016  . Essential hypertension 08/14/2016  . Headache 08/14/2016  . Hyperlipidemia LDL goal <70 08/14/2016  . Abnormal nuclear stress test   . 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 Acute respiratory failure with  hypoxia ([place]) 04/27/2016  . Hypoxia   . Hematoma 04/25/2016  . Mass of chest wall, left   . Abnormal partial thromboplastin time (PTT)   . 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 Coronary artery disease due to lipid rich plaque 04/15/2016  . Leukocytosis 04/15/2016  . Diabetes mellitus ([place]) 04/15/2016  . Pleuritic pain 04/15/2016  . ESRD on hemodialysis ([place]) 04/15/2016  . Left shoulder pain 04/15/2016  . History of adenomatous polyp of colon 01/21/2013  . GERD (gastroesophageal reflux disease) 01/21/2013    Past Surgical History:  Procedure Laterality Date  . AV FISTULA PLACEMENT Left 04/14/2019   Procedure: ARTERIOVENOUS (AV) FISTULA CREATION LEFT ARM;  Surgeon: Rosetta Posner, MD;  Location: [place];  Service: Vascular;  Laterality: Left;  . BIOPSY  12/08/2017   Procedure: BIOPSY;  Surgeon: Danie Binder, MD;  Location[facility];  Service: Endoscopy;;  gastric  . BONE MARROW BIOPSY     x 3 times  . CARDIAC CATHETERIZATION N/A 08/12/2016   Procedure: Left Heart Cath and Coronary Angiography;  Surgeon: Jettie Booze, MD;  Location: [place] CV LAB;  Service: Cardiovascular;  Laterality: N/A;  . CARDIAC CATHETERIZATION N/A 08/12/2016   Procedure: Coronary Stent Intervention;  Surgeon: Jettie Booze, MD;  Location:  [place] INVASIVE CV LAB;  Service: Cardiovascular;  Laterality: N/A;  . CARDIAC CATHETERIZATION  2000s X 1  . COLONOSCOPY WITH PROPOFOL N/A 02/01/2013   SLF: 1. 23 colon polyps removed. 2 retrieved. 2. Mild diverticulosis in teh sigmoid colon 3. Small internal hemorrhoids 4. The colon is redundant   . COLONOSCOPY WITH PROPOFOL N/A 12/08/2017   Procedure: COLONOSCOPY WITH PROPOFOL;  Surgeon: Danie Binder, MD;  [facility];  Service: Endoscopy;  Laterality: N/A;  1:45pm  . CYSTOSCOPY W/ STONE MANIPULATION    . ESOPHAGOGASTRODUODENOSCOPY (EGD) WITH PROPOFOL N/A 02/01/2013   SLF: 1. Moderate erosive gastritis  . ESOPHAGOGASTRODUODENOSCOPY (EGD)  WITH PROPOFOL N/A 12/08/2017   Procedure: ESOPHAGOGASTRODUODENOSCOPY (EGD) WITH PROPOFOL;  Surgeon: Danie Binder, MD;  [facility];  Service: Endoscopy;  Laterality: N/A;  . LUMBAR [place]  . POLYPECTOMY N/A 02/01/2013   Procedure: POLYPECTOMY;  Surgeon: Danie Binder, MD;  Location: [facility] ORS;  Service: Endoscopy;  Laterality: N/A;        Home Medications    Prior to Admission medications   Medication Sig Start Date End Date Taking? Authorizing Provider  acetaminophen (TYLENOL) 500 MG tablet Take 1,000 mg by mouth 2 (two) times daily as needed for mild pain or moderate pain.     [provider]  allopurinol (ZYLOPRIM) 100 MG tablet Take 200 mg by mouth every morning.    [provider]  aspirin EC 81 MG EC tablet Take 1 tablet (81 mg total) by mouth daily with breakfast. 04/21/19   Denton Brick, Cour[place], MD  calcium acetate (PHOSLO) 667 MG capsule Take 667 mg by mouth 3 (three) times daily with meals.    [provider]  DULoxetine (CYMBALTA) 60 MG capsule Take 1 capsule (60 mg total) by mouth daily. 04/21/19   Roxan Hockey, MD  gabapentin (NEURONTIN) 100 MG capsule Take 1 capsule (100 mg total) by mouth at bedtime. 04/21/19   Roxan Hockey, MD  hydrALAZINE (APRESOLINE) 50 MG tablet Take 2 tablets (100 mg total) by mouth 3 (three) times daily. Patient taking differently: Take 50 mg by mouth 3 (three) times daily.  10/20/18   Arnoldo Lenis, MD  hydrOXYzine (ATARAX/VISTARIL) 25 MG tablet Take 1 tablet (25 mg total) by mouth every 8 (eight) hours as needed for anxiety, itching or nausea. 04/21/19   Roxan Hockey, MD  methocarbamol (ROBAXIN) 750 MG tablet Take 1 tablet (750 mg total) by mouth 3 (three) times daily. For muscle pain/spasm 04/21/19   Roxan Hockey, MD  metoprolol tartrate (LOPRESSOR) 25 MG tablet Take 1 tablet (25 mg total) by mouth 2 (two) times daily. 04/21/19   Roxan Hockey, MD  nitroGLYCERIN (NITROSTAT) 0.4 MG SL tablet  Place 1 tablet (0.4 mg total) under the tongue every 5 (five) minutes x 3 doses as needed for chest pain (if no relief after 3rd dose, proceed to the ED for an evaluation or call 911). 10/04/18   Arnoldo Lenis, MD  ruxolitinib phosphate (JAKAFI) 5 MG tablet Take 5 mg by mouth daily.    [provider]  traZODone (DESYREL) 50 MG tablet Take 1 tablet (50 mg total) by mouth at bedtime as needed for sleep. 04/21/19   Roxan Hockey, MD    Family History Family History  Problem Relation Age of Onset  . Heart attack Mother   . CVA Mother   . Heart attack Father   . Colon cancer Neg Hx     Social History Social  History   Tobacco Use  . Smoking status: Former Smoker    Packs/day: 1.00    Years: 20.00    Pack years: 20.00    Types: Cigars    Start date: 06/25/1975    Quit date: 08/04/2013    Years since quitting: 5.7  . Smokeless tobacco: Never Used  Substance Use Topics  . Alcohol use: Not Currently    Comment: Remote history of ETOH abuse, "when I was young and dumb"   . Drug use: No     Allergies   Hydrocodone   Review of Systems Review of Systems  Respiratory: Positive for shortness of breath.   All other systems reviewed and are negative.    Physical Exam Updated Vital Signs BP (!) 170/79 (BP Location: Right Arm)   Pulse 78   Temp 97.9 F (36.6 C) (Oral)   Resp 18   Ht 6' (1.829 m)   Wt 90.7 kg   SpO2 94%   BMI 27.12 kg/m   Physical Exam Vitals signs and nursing note reviewed.  Constitutional:      Appearance: Normal appearance.  HENT:     Head: Normocephalic and atraumatic.     Right Ear: External ear normal.     Left Ear: External ear normal.     Nose: Nose normal.     Mouth/Throat:     Mouth: Mucous membranes are moist.     Pharynx: Oropharynx is clear.  Eyes:     Extraocular Movements: Extraocular movements intact.     Conjunctiva/sclera: Conjunctivae normal.     Pupils: Pupils are equal, round, and reactive to light.  Neck:      Musculoskeletal: Normal range of motion and neck supple.  Cardiovascular:     Rate and Rhythm: Normal rate and regular rhythm.  Pulmonary:     Breath sounds: Rales present.  Abdominal:     General: Abdomen is flat. Bowel sounds are normal.     Palpations: Abdomen is soft.  Musculoskeletal: Normal range of motion.  Skin:    General: Skin is warm.     Capillary Refill: Capillary refill takes less than 2 seconds.  Neurological:     General: No focal deficit present.     Mental Status: He is alert and oriented to person, place, and time.  Psychiatric:        Mood and Affect: Mood normal.        Behavior: Behavior normal.      ED Treatments / Results  Labs (all labs ordered are listed, but only abnormal results are displayed) Labs Reviewed  BASIC METABOLIC PANEL - Abnormal; Notable for the following components:      Result Value   CO2 20 (*)    Glucose, Bld 126 (*)    BUN 59 (*)    Creatinine, Ser 4.91 (*)    Calcium 8.1 (*)    GFR calc non Af Amer 11 (*)    GFR calc Af Amer 13 (*)    All other components within normal limits  CBC WITH DIFFERENTIAL/PLATELET - Abnormal; Notable for the following components:   RBC 3.09 (*)    Hemoglobin 10.0 (*)    HCT 30.8 (*)    RDW 19.5 (*)    nRBC 0.7 (*)    Monocytes Absolute 1.4 (*)    Abs Immature Granulocytes 1.96 (*)    All other components within normal limits    EKG EKG Interpretation  Date/Time:  Wednesday May 11 2019 09:51:03 EDT Ventricular  Rate:  82 PR Interval:    QRS Duration: 89 QT Interval:  415 QTC Calculation: 485 R Axis:   31 Text Interpretation:  Sinus rhythm Atrial premature complex Borderline low voltage, extremity leads Borderline ST depression, diffuse leads Borderline prolonged QT interval No significant change since last tracing Confirmed by Isla Pence [phone]) on 05/11/2019 10:01:40 AM   Radiology Dg Chest Portable 1 View  Result Date: 05/11/2019 CLINICAL DATA:  73 year old male with  shortness of breath. EXAM: PORTABLE CHEST 1 VIEW COMPARISON:  Chest radiograph dated 04/19/2019 FINDINGS: Right-sided dialysis catheter with tip in stable position. There is mild cardiomegaly with mild vascular congestion, similar or slightly increased since the prior radiograph. There is shallow inspiration. No focal consolidation, pleural effusion, or pneumothorax. No acute osseous pathology. IMPRESSION: 1. Cardiomegaly with mild vascular congestion. No focal consolidation. 2. Stable positioning of the right-sided dialysis catheter. Electronically Signed   By: Anner Crete M.D.   On: 05/11/2019 09:54    Procedures Procedures (including critical care time)  Medications Ordered in ED Medications - No data to display   Initial Impression / Assessment and Plan / ED Course  I have reviewed the triage vital signs and the nursing notes.  Pertinent labs & imaging results that were available during my care of the patient were reviewed by me and considered in my medical decision making (see chart for details).       Pt's potassium is ok and he is not hypoxic.  I called his dialysis center and they can fit him if he goes now.  Pt is told he needs to go straight there.  Pt is stable for d/c.  Return if worse.  Final Clinical Impressions(s) / ED Diagnoses   Final diagnoses:  ESRD on hemodialysis ([place])  Acute pulmonary edema [facility])    ED Discharge Orders    None       Isla Pence, MD 05/11/19 1048

## 2019-05-13 DIAGNOSIS — N186 End stage renal disease: Secondary | ICD-10-CM | POA: Diagnosis not present

## 2019-05-13 DIAGNOSIS — Z992 Dependence on renal dialysis: Secondary | ICD-10-CM | POA: Diagnosis not present

## 2019-05-13 DIAGNOSIS — D631 Anemia in chronic kidney disease: Secondary | ICD-10-CM | POA: Diagnosis not present

## 2019-05-13 DIAGNOSIS — E559 Vitamin D deficiency, unspecified: Secondary | ICD-10-CM | POA: Diagnosis not present

## 2019-05-13 DIAGNOSIS — D509 Iron deficiency anemia, unspecified: Secondary | ICD-10-CM | POA: Diagnosis not present

## 2019-05-16 DIAGNOSIS — D631 Anemia in chronic kidney disease: Secondary | ICD-10-CM | POA: Diagnosis not present

## 2019-05-16 DIAGNOSIS — E559 Vitamin D deficiency, unspecified: Secondary | ICD-10-CM | POA: Diagnosis not present

## 2019-05-16 DIAGNOSIS — Z992 Dependence on renal dialysis: Secondary | ICD-10-CM | POA: Diagnosis not present

## 2019-05-16 DIAGNOSIS — N186 End stage renal disease: Secondary | ICD-10-CM | POA: Diagnosis not present

## 2019-05-16 DIAGNOSIS — D509 Iron deficiency anemia, unspecified: Secondary | ICD-10-CM | POA: Diagnosis not present

## 2019-05-17 DIAGNOSIS — F329 Major depressive disorder, single episode, unspecified: Secondary | ICD-10-CM | POA: Diagnosis not present

## 2019-05-17 DIAGNOSIS — D473 Essential (hemorrhagic) thrombocythemia: Secondary | ICD-10-CM | POA: Diagnosis not present

## 2019-05-17 DIAGNOSIS — D72829 Elevated white blood cell count, unspecified: Secondary | ICD-10-CM | POA: Diagnosis not present

## 2019-05-17 DIAGNOSIS — R109 Unspecified abdominal pain: Secondary | ICD-10-CM | POA: Diagnosis not present

## 2019-05-17 DIAGNOSIS — D471 Chronic myeloproliferative disease: Secondary | ICD-10-CM | POA: Diagnosis not present

## 2019-05-17 DIAGNOSIS — Z87891 Personal history of nicotine dependence: Secondary | ICD-10-CM | POA: Diagnosis not present

## 2019-05-17 DIAGNOSIS — R161 Splenomegaly, not elsewhere classified: Secondary | ICD-10-CM | POA: Diagnosis not present

## 2019-05-17 DIAGNOSIS — Z992 Dependence on renal dialysis: Secondary | ICD-10-CM | POA: Diagnosis not present

## 2019-05-17 DIAGNOSIS — I509 Heart failure, unspecified: Secondary | ICD-10-CM | POA: Diagnosis not present

## 2019-05-17 DIAGNOSIS — F419 Anxiety disorder, unspecified: Secondary | ICD-10-CM | POA: Diagnosis not present

## 2019-05-17 DIAGNOSIS — D469 Myelodysplastic syndrome, unspecified: Secondary | ICD-10-CM | POA: Diagnosis not present

## 2019-05-17 DIAGNOSIS — I10 Essential (primary) hypertension: Secondary | ICD-10-CM | POA: Diagnosis not present

## 2019-05-17 DIAGNOSIS — I998 Other disorder of circulatory system: Secondary | ICD-10-CM | POA: Diagnosis not present

## 2019-05-17 DIAGNOSIS — Z955 Presence of coronary angioplasty implant and graft: Secondary | ICD-10-CM | POA: Diagnosis not present

## 2019-05-18 ENCOUNTER — Other Ambulatory Visit: Payer: Self-pay

## 2019-05-18 DIAGNOSIS — N186 End stage renal disease: Secondary | ICD-10-CM | POA: Diagnosis not present

## 2019-05-18 DIAGNOSIS — Z992 Dependence on renal dialysis: Secondary | ICD-10-CM

## 2019-05-18 DIAGNOSIS — D631 Anemia in chronic kidney disease: Secondary | ICD-10-CM | POA: Diagnosis not present

## 2019-05-18 DIAGNOSIS — E559 Vitamin D deficiency, unspecified: Secondary | ICD-10-CM | POA: Diagnosis not present

## 2019-05-18 DIAGNOSIS — D509 Iron deficiency anemia, unspecified: Secondary | ICD-10-CM | POA: Diagnosis not present

## 2019-05-20 DIAGNOSIS — D509 Iron deficiency anemia, unspecified: Secondary | ICD-10-CM | POA: Diagnosis not present

## 2019-05-20 DIAGNOSIS — N186 End stage renal disease: Secondary | ICD-10-CM | POA: Diagnosis not present

## 2019-05-20 DIAGNOSIS — Z992 Dependence on renal dialysis: Secondary | ICD-10-CM | POA: Diagnosis not present

## 2019-05-20 DIAGNOSIS — E559 Vitamin D deficiency, unspecified: Secondary | ICD-10-CM | POA: Diagnosis not present

## 2019-05-20 DIAGNOSIS — D631 Anemia in chronic kidney disease: Secondary | ICD-10-CM | POA: Diagnosis not present

## 2019-05-21 DIAGNOSIS — Z992 Dependence on renal dialysis: Secondary | ICD-10-CM | POA: Diagnosis not present

## 2019-05-21 DIAGNOSIS — N186 End stage renal disease: Secondary | ICD-10-CM | POA: Diagnosis not present

## 2019-05-23 DIAGNOSIS — Z992 Dependence on renal dialysis: Secondary | ICD-10-CM | POA: Diagnosis not present

## 2019-05-23 DIAGNOSIS — D631 Anemia in chronic kidney disease: Secondary | ICD-10-CM | POA: Diagnosis not present

## 2019-05-23 DIAGNOSIS — D509 Iron deficiency anemia, unspecified: Secondary | ICD-10-CM | POA: Diagnosis not present

## 2019-05-23 DIAGNOSIS — E559 Vitamin D deficiency, unspecified: Secondary | ICD-10-CM | POA: Diagnosis not present

## 2019-05-23 DIAGNOSIS — N186 End stage renal disease: Secondary | ICD-10-CM | POA: Diagnosis not present

## 2019-05-24 ENCOUNTER — Other Ambulatory Visit: Payer: Self-pay

## 2019-05-24 ENCOUNTER — Ambulatory Visit (HOSPITAL_COMMUNITY)
Admission: RE | Admit: 2019-05-24 | Discharge: 2019-05-24 | Disposition: A | Discharge status: Home / Self Care | Payer: Medicare Other | Source: Ambulatory Visit | Attending: Vascular Surgery | Admitting: Vascular Surgery

## 2019-05-24 ENCOUNTER — Ambulatory Visit (INDEPENDENT_AMBULATORY_CARE_PROVIDER_SITE_OTHER): Payer: Self-pay | Admitting: Physician Assistant

## 2019-05-24 VITALS — BP 163/78 | HR 75 | Temp 97.8°F | Resp 14 | Ht 72.0 in | Wt 188.6 lb

## 2019-05-24 DIAGNOSIS — N186 End stage renal disease: Secondary | ICD-10-CM | POA: Diagnosis not present

## 2019-05-24 DIAGNOSIS — Z992 Dependence on renal dialysis: Secondary | ICD-10-CM | POA: Insufficient documentation

## 2019-05-24 NOTE — Progress Notes (Signed)
    Postoperative Access Visit   History of Present Illness   [name] is a 73 y.o. year old male who presents for postoperative follow-up for: left radiocephalic arteriovenous fistula (Date: 04/14/19).  The patient's wounds are healed.  The patient denies steal symptoms.  The patient is able to complete their activities of daily living. He is currently dialyzing via R IJ TDC on a MWF schedule in [place].   Physical Examination   Vitals:   05/24/19 1450  BP: (!) 163/78  Pulse: 75  Resp: 14  Temp: 97.8 F (36.6 C)  TempSrc: Temporal  SpO2: 98%  Weight: 188 lb 9.6 oz (85.5 kg)  Height: 6' (1.829 m)   Body mass index is 25.58 kg/m.  left arm Incision is healed, hand grip is 5/5, sensation in digits is intact, palpable thrill, bruit can be auscultated     Medical Decision Making   [name] is a 73 y.o. year old male who presents s/p left radiocephalic arteriovenous fistula   Patent radiocephalic fistula without signs or symptoms of steal syndrome  The patient's access will be ready for use 07/07/19  The patient's tunneled dialysis catheter can be removed when Nephrology is comfortable with the performance of the fistula  The patient may follow up on a prn basis   Dagoberto Ligas PA-C [facility] Office: [phone]  Clinic MD: Dr. Donnetta Hutching

## 2019-05-25 DIAGNOSIS — D631 Anemia in chronic kidney disease: Secondary | ICD-10-CM | POA: Diagnosis not present

## 2019-05-25 DIAGNOSIS — E559 Vitamin D deficiency, unspecified: Secondary | ICD-10-CM | POA: Diagnosis not present

## 2019-05-25 DIAGNOSIS — D509 Iron deficiency anemia, unspecified: Secondary | ICD-10-CM | POA: Diagnosis not present

## 2019-05-25 DIAGNOSIS — Z992 Dependence on renal dialysis: Secondary | ICD-10-CM | POA: Diagnosis not present

## 2019-05-25 DIAGNOSIS — N186 End stage renal disease: Secondary | ICD-10-CM | POA: Diagnosis not present

## 2019-05-27 DIAGNOSIS — D631 Anemia in chronic kidney disease: Secondary | ICD-10-CM | POA: Diagnosis not present

## 2019-05-27 DIAGNOSIS — N186 End stage renal disease: Secondary | ICD-10-CM | POA: Diagnosis not present

## 2019-05-27 DIAGNOSIS — Z992 Dependence on renal dialysis: Secondary | ICD-10-CM | POA: Diagnosis not present

## 2019-05-27 DIAGNOSIS — D509 Iron deficiency anemia, unspecified: Secondary | ICD-10-CM | POA: Diagnosis not present

## 2019-05-27 DIAGNOSIS — E559 Vitamin D deficiency, unspecified: Secondary | ICD-10-CM | POA: Diagnosis not present

## 2019-05-30 DIAGNOSIS — D631 Anemia in chronic kidney disease: Secondary | ICD-10-CM | POA: Diagnosis not present

## 2019-05-30 DIAGNOSIS — D509 Iron deficiency anemia, unspecified: Secondary | ICD-10-CM | POA: Diagnosis not present

## 2019-05-30 DIAGNOSIS — E559 Vitamin D deficiency, unspecified: Secondary | ICD-10-CM | POA: Diagnosis not present

## 2019-05-30 DIAGNOSIS — Z992 Dependence on renal dialysis: Secondary | ICD-10-CM | POA: Diagnosis not present

## 2019-05-30 DIAGNOSIS — N186 End stage renal disease: Secondary | ICD-10-CM | POA: Diagnosis not present

## 2019-05-31 DIAGNOSIS — E1165 Type 2 diabetes mellitus with hyperglycemia: Secondary | ICD-10-CM | POA: Diagnosis not present

## 2019-05-31 DIAGNOSIS — Z2821 Immunization not carried out because of patient refusal: Secondary | ICD-10-CM | POA: Diagnosis not present

## 2019-05-31 DIAGNOSIS — Z299 Encounter for prophylactic measures, unspecified: Secondary | ICD-10-CM | POA: Diagnosis not present

## 2019-05-31 DIAGNOSIS — E1122 Type 2 diabetes mellitus with diabetic chronic kidney disease: Secondary | ICD-10-CM | POA: Diagnosis not present

## 2019-05-31 DIAGNOSIS — I10 Essential (primary) hypertension: Secondary | ICD-10-CM | POA: Diagnosis not present

## 2019-05-31 DIAGNOSIS — Z6828 Body mass index (BMI) 28.0-28.9, adult: Secondary | ICD-10-CM | POA: Diagnosis not present

## 2019-05-31 DIAGNOSIS — D696 Thrombocytopenia, unspecified: Secondary | ICD-10-CM | POA: Diagnosis not present

## 2019-06-01 DIAGNOSIS — Z992 Dependence on renal dialysis: Secondary | ICD-10-CM | POA: Diagnosis not present

## 2019-06-01 DIAGNOSIS — N186 End stage renal disease: Secondary | ICD-10-CM | POA: Diagnosis not present

## 2019-06-01 DIAGNOSIS — D509 Iron deficiency anemia, unspecified: Secondary | ICD-10-CM | POA: Diagnosis not present

## 2019-06-01 DIAGNOSIS — E559 Vitamin D deficiency, unspecified: Secondary | ICD-10-CM | POA: Diagnosis not present

## 2019-06-01 DIAGNOSIS — D631 Anemia in chronic kidney disease: Secondary | ICD-10-CM | POA: Diagnosis not present

## 2019-06-03 DIAGNOSIS — N186 End stage renal disease: Secondary | ICD-10-CM | POA: Diagnosis not present

## 2019-06-03 DIAGNOSIS — Z992 Dependence on renal dialysis: Secondary | ICD-10-CM | POA: Diagnosis not present

## 2019-06-03 DIAGNOSIS — E559 Vitamin D deficiency, unspecified: Secondary | ICD-10-CM | POA: Diagnosis not present

## 2019-06-03 DIAGNOSIS — D631 Anemia in chronic kidney disease: Secondary | ICD-10-CM | POA: Diagnosis not present

## 2019-06-03 DIAGNOSIS — D509 Iron deficiency anemia, unspecified: Secondary | ICD-10-CM | POA: Diagnosis not present

## 2019-06-06 DIAGNOSIS — N186 End stage renal disease: Secondary | ICD-10-CM | POA: Diagnosis not present

## 2019-06-06 DIAGNOSIS — D631 Anemia in chronic kidney disease: Secondary | ICD-10-CM | POA: Diagnosis not present

## 2019-06-06 DIAGNOSIS — D509 Iron deficiency anemia, unspecified: Secondary | ICD-10-CM | POA: Diagnosis not present

## 2019-06-06 DIAGNOSIS — E559 Vitamin D deficiency, unspecified: Secondary | ICD-10-CM | POA: Diagnosis not present

## 2019-06-06 DIAGNOSIS — Z992 Dependence on renal dialysis: Secondary | ICD-10-CM | POA: Diagnosis not present

## 2019-06-08 DIAGNOSIS — D631 Anemia in chronic kidney disease: Secondary | ICD-10-CM | POA: Diagnosis not present

## 2019-06-08 DIAGNOSIS — Z992 Dependence on renal dialysis: Secondary | ICD-10-CM | POA: Diagnosis not present

## 2019-06-08 DIAGNOSIS — D509 Iron deficiency anemia, unspecified: Secondary | ICD-10-CM | POA: Diagnosis not present

## 2019-06-08 DIAGNOSIS — N186 End stage renal disease: Secondary | ICD-10-CM | POA: Diagnosis not present

## 2019-06-08 DIAGNOSIS — E559 Vitamin D deficiency, unspecified: Secondary | ICD-10-CM | POA: Diagnosis not present

## 2019-06-10 DIAGNOSIS — E559 Vitamin D deficiency, unspecified: Secondary | ICD-10-CM | POA: Diagnosis not present

## 2019-06-10 DIAGNOSIS — D509 Iron deficiency anemia, unspecified: Secondary | ICD-10-CM | POA: Diagnosis not present

## 2019-06-10 DIAGNOSIS — Z992 Dependence on renal dialysis: Secondary | ICD-10-CM | POA: Diagnosis not present

## 2019-06-10 DIAGNOSIS — D631 Anemia in chronic kidney disease: Secondary | ICD-10-CM | POA: Diagnosis not present

## 2019-06-10 DIAGNOSIS — N186 End stage renal disease: Secondary | ICD-10-CM | POA: Diagnosis not present

## 2019-06-15 DIAGNOSIS — Z992 Dependence on renal dialysis: Secondary | ICD-10-CM | POA: Diagnosis not present

## 2019-06-15 DIAGNOSIS — E559 Vitamin D deficiency, unspecified: Secondary | ICD-10-CM | POA: Diagnosis not present

## 2019-06-15 DIAGNOSIS — D631 Anemia in chronic kidney disease: Secondary | ICD-10-CM | POA: Diagnosis not present

## 2019-06-15 DIAGNOSIS — D509 Iron deficiency anemia, unspecified: Secondary | ICD-10-CM | POA: Diagnosis not present

## 2019-06-15 DIAGNOSIS — N186 End stage renal disease: Secondary | ICD-10-CM | POA: Diagnosis not present

## 2019-06-17 DIAGNOSIS — D631 Anemia in chronic kidney disease: Secondary | ICD-10-CM | POA: Diagnosis not present

## 2019-06-17 DIAGNOSIS — Z992 Dependence on renal dialysis: Secondary | ICD-10-CM | POA: Diagnosis not present

## 2019-06-17 DIAGNOSIS — D509 Iron deficiency anemia, unspecified: Secondary | ICD-10-CM | POA: Diagnosis not present

## 2019-06-17 DIAGNOSIS — E559 Vitamin D deficiency, unspecified: Secondary | ICD-10-CM | POA: Diagnosis not present

## 2019-06-17 DIAGNOSIS — N186 End stage renal disease: Secondary | ICD-10-CM | POA: Diagnosis not present

## 2019-06-20 ENCOUNTER — Emergency Department (HOSPITAL_COMMUNITY)
Admission: EM | Admit: 2019-06-20 | Discharge: 2019-06-20 | Disposition: A | Discharge status: Home / Self Care | Payer: Medicare Other | Attending: Emergency Medicine | Admitting: Emergency Medicine

## 2019-06-20 ENCOUNTER — Other Ambulatory Visit: Payer: Self-pay

## 2019-06-20 ENCOUNTER — Emergency Department (HOSPITAL_COMMUNITY): Payer: Medicare Other

## 2019-06-20 ENCOUNTER — Encounter (HOSPITAL_COMMUNITY): Payer: Self-pay | Admitting: Emergency Medicine

## 2019-06-20 DIAGNOSIS — I509 Heart failure, unspecified: Secondary | ICD-10-CM | POA: Diagnosis not present

## 2019-06-20 DIAGNOSIS — I2510 Atherosclerotic heart disease of native coronary artery without angina pectoris: Secondary | ICD-10-CM | POA: Insufficient documentation

## 2019-06-20 DIAGNOSIS — E1122 Type 2 diabetes mellitus with diabetic chronic kidney disease: Secondary | ICD-10-CM | POA: Insufficient documentation

## 2019-06-20 DIAGNOSIS — Z7982 Long term (current) use of aspirin: Secondary | ICD-10-CM | POA: Insufficient documentation

## 2019-06-20 DIAGNOSIS — Y9389 Activity, other specified: Secondary | ICD-10-CM | POA: Diagnosis not present

## 2019-06-20 DIAGNOSIS — Z992 Dependence on renal dialysis: Secondary | ICD-10-CM | POA: Diagnosis not present

## 2019-06-20 DIAGNOSIS — I132 Hypertensive heart and chronic kidney disease with heart failure and with stage 5 chronic kidney disease, or end stage renal disease: Secondary | ICD-10-CM | POA: Insufficient documentation

## 2019-06-20 DIAGNOSIS — Z79899 Other long term (current) drug therapy: Secondary | ICD-10-CM | POA: Diagnosis not present

## 2019-06-20 DIAGNOSIS — W19XXXA Unspecified fall, initial encounter: Secondary | ICD-10-CM

## 2019-06-20 DIAGNOSIS — S0990XA Unspecified injury of head, initial encounter: Secondary | ICD-10-CM | POA: Diagnosis not present

## 2019-06-20 DIAGNOSIS — N186 End stage renal disease: Secondary | ICD-10-CM | POA: Insufficient documentation

## 2019-06-20 DIAGNOSIS — Z87891 Personal history of nicotine dependence: Secondary | ICD-10-CM | POA: Diagnosis not present

## 2019-06-20 DIAGNOSIS — Y998 Other external cause status: Secondary | ICD-10-CM | POA: Diagnosis not present

## 2019-06-20 DIAGNOSIS — Y929 Unspecified place or not applicable: Secondary | ICD-10-CM | POA: Diagnosis not present

## 2019-06-20 DIAGNOSIS — W01198A Fall on same level from slipping, tripping and stumbling with subsequent striking against other object, initial encounter: Secondary | ICD-10-CM | POA: Insufficient documentation

## 2019-06-20 NOTE — ED Provider Notes (Signed)
[facility] EMERGENCY DEPARTMENT Provider Note   CSN: [id number] Arrival date & time: 06/20/19  1114     History   Chief Complaint Chief Complaint  Patient presents with   Fall    HPI [name] is a 73 y.o. male who presents emergency department for evaluation of minor head injury.  He has a past medical history of end-stage renal disease on hemodialysis Monday Wednesday and Friday, CAD, CHF, hypertension, type 2 diabetes.  Patient states that he tripped yesterday and fell and "barely tapped my head."  He went to go to dialysis today however they refused to dialyze him unless he came to the ER and got a head CT.  He states "I do not even have a mark on my head."  He denies any headache, change in vision, difficulty with ambulation, upper extremity numbness or paresthesia.     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, unclassifiable - folowed at [facility]   Pleural effusion    had Thorancentesis at [facility]   Pneumonia    last time 08/2018- Aspiration Pneumonia - unintentional opiate overdose   Renal insufficiency    Spleen enlarged    Thrombocythemia ([place])    Type II diabetes mellitus ([place])     Patient Active Problem List   Diagnosis Date Noted   Intractable pain 04/19/2019   Elevated d-dimer 04/19/2019   RUQ abdominal pain 11/18/2017   Acute CHF (congestive heart failure)  ([place]) 09/15/2016   Essential hypertension 08/14/2016   Headache 08/14/2016   Hyperlipidemia LDL goal <70 08/14/2016   Abnormal nuclear stress test    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[place])    Acute respiratory failure with hypoxia ([place]) 04/27/2016   Hypoxia    Hematoma 04/25/2016   Mass of chest wall, left    Abnormal partial thromboplastin time (PTT)    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HCC)    Coronary artery disease due to lipid rich plaque 04/15/2016   Leukocytosis 04/15/2016   Diabetes mellitus ([place]) 04/15/2016   Pleuritic pain 04/15/2016   ESRD on hemodialysis ([place]) 04/15/2016   Left shoulder pain 04/15/2016   History of adenomatous polyp of colon 01/21/2013   GERD (gastroesophageal reflux disease) 01/21/2013    Past Surgical History:  Procedure Laterality Date   AV FISTULA PLACEMENT Left 04/14/2019   Procedure: ARTERIOVENOUS (AV) FISTULA CREATION LEFT ARM;  Surgeon: Rosetta Posner, MD;  Location: [place];  Service: Vascular;  Laterality: Left;   BIOPSY  12/08/2017   Procedure: BIOPSY;  Surgeon: Danie Binder, MD;  [facility];  Service: Endoscopy;;  gastric   BONE MARROW BIOPSY     x 3 times   CARDIAC CATHETERIZATION N/A 08/12/2016   Procedure: Left Heart Cath and Coronary Angiography;  Surgeon: Jettie Booze, MD;  Location: [facility]  INVASIVE CV LAB;  Service: Cardiovascular;  Laterality: N/A;   CARDIAC CATHETERIZATION N/A 08/12/2016   Procedure: Coronary Stent Intervention;  Surgeon: Jettie Booze, MD;  Location: [place] CV LAB;  Service: Cardiovascular;  Laterality: N/A;   CARDIAC CATHETERIZATION  2000s X 1   COLONOSCOPY WITH PROPOFOL N/A 02/01/2013   SLF: 1. 23 colon polyps removed. 2 retrieved. 2. Mild diverticulosis in teh sigmoid colon 3. Small internal hemorrhoids 4. The colon is redundant    COLONOSCOPY WITH PROPOFOL N/A 12/08/2017   Procedure: COLONOSCOPY WITH PROPOFOL;  Surgeon: Danie Binder, MD;  [facility];   Service: Endoscopy;  Laterality: N/A;  1:45pm   CYSTOSCOPY W/ STONE MANIPULATION     ESOPHAGOGASTRODUODENOSCOPY (EGD) WITH PROPOFOL N/A 02/01/2013   SLF: 1. Moderate erosive gastritis   ESOPHAGOGASTRODUODENOSCOPY (EGD) WITH PROPOFOL N/A 12/08/2017   Procedure: ESOPHAGOGASTRODUODENOSCOPY (EGD) WITH PROPOFOL;  Surgeon: Danie Binder, MD;  [facility];  Service: Endoscopy;  Laterality: N/A;   [place]   POLYPECTOMY N/A 02/01/2013   Procedure: POLYPECTOMY;  Surgeon: Danie Binder, MD;  Location: [facility] OR[facility];  Service: Endoscopy;  Laterality: N/A;        Home Medications    Prior to Admission medications   Medication Sig Start Date End Date Taking? Authorizing Provider  acetaminophen (TYLENOL) 500 MG tablet Take 1,000 mg by mouth 2 (two) times daily as needed for mild pain or moderate pain.     [provider]  allopurinol (ZYLOPRIM) 100 MG tablet Take 200 mg by mouth every morning.    [provider]  aspirin EC 81 MG EC tablet Take 1 tablet (81 mg total) by mouth daily with breakfast. 04/21/19   Denton Brick, [place], MD  calcium acetate (PHOSLO) 667 MG capsule Take 667 mg by mouth 3 (three) times daily with meals.    [provider]  DULoxetine (CYMBALTA) 60 MG capsule Take 1 capsule (60 mg total) by mouth daily. 04/21/19   Roxan Hockey, MD  gabapentin (NEURONTIN) 100 MG capsule Take 1 capsule (100 mg total) by mouth at bedtime. 04/21/19   Roxan Hockey, MD  hydrALAZINE (APRESOLINE) 50 MG tablet Take 2 tablets (100 mg total) by mouth 3 (three) times daily. Patient taking differently: Take 50 mg by mouth 3 (three) times daily.  10/20/18   Arnoldo Lenis, MD  hydrOXYzine (ATARAX/VISTARIL) 25 MG tablet Take 1 tablet (25 mg total) by mouth every 8 (eight) hours as needed for anxiety, itching or nausea. 04/21/19   Roxan Hockey, MD  methocarbamol (ROBAXIN) 750 MG tablet Take 1 tablet (750 mg total) by mouth 3 (three) times daily. For  muscle pain/spasm 04/21/19   Roxan Hockey, MD  metoprolol tartrate (LOPRESSOR) 25 MG tablet Take 1 tablet (25 mg total) by mouth 2 (two) times daily. 04/21/19   Roxan Hockey, MD  nitroGLYCERIN (NITROSTAT) 0.4 MG SL tablet Place 1 tablet (0.4 mg total) under the tongue every 5 (five) minutes x 3 doses as needed for chest pain (if no relief after 3rd dose, proceed to the ED for an evaluation or call 911). 10/04/18   Arnoldo Lenis, MD  ruxolitinib phosphate (JAKAFI) 5 MG tablet Take 5 mg by mouth daily.    [provider]  traZODone (DESYREL) 50 MG tablet Take 1 tablet (50 mg total) by mouth at bedtime as needed for sleep. 04/21/19   Roxan Hockey, MD    Family History Family History  Problem Relation Age of Onset  Heart attack Mother    CVA Mother    Heart attack Father    Colon cancer Neg Hx     Social History Social History   Tobacco Use   Smoking status: Former Smoker    Packs/day: 1.00    Years: 20.00    Pack years: 20.00    Types: Cigars    Start date: 06/25/1975    Quit date: 08/04/2013    Years since quitting: 5.8   Smokeless tobacco: Never Used  Substance Use Topics   Alcohol use: Not Currently    Comment: Remote history of ETOH abuse, "when I was young and dumb"    Drug use: No     Allergies   Hydrocodone   Review of Systems Review of Systems Ten systems reviewed and are negative for acute change, except as noted in the HPI.    Physical Exam Updated Vital Signs BP (!) 165/81 (BP Location: Right Arm)    Pulse 82    Temp 97.9 F (36.6 C) (Oral)    Resp 18    Ht 6' (1.829 m)    Wt 83 kg    SpO2 97%    BMI 24.82 kg/m   Physical Exam Vitals signs and nursing note reviewed.  Constitutional:      General: He is not in acute distress.    Appearance: He is well-developed. He is not diaphoretic.  HENT:     Head: Normocephalic and atraumatic.  Eyes:     General: No scleral icterus.    Conjunctiva/sclera: Conjunctivae normal.      Pupils: Pupils are equal, round, and reactive to light.     Comments: No horizontal, vertical or rotational nystagmus  Neck:     Musculoskeletal: Normal range of motion and neck supple.     Comments: Full active and passive ROM without pain No midline or paraspinal tenderness No nuchal rigidity or meningeal signs Cardiovascular:     Rate and Rhythm: Normal rate and regular rhythm.     Heart sounds: Normal heart sounds.  Pulmonary:     Effort: Pulmonary effort is normal. No respiratory distress.     Breath sounds: Normal breath sounds. No wheezing or rales.  Abdominal:     General: Bowel sounds are normal.     Palpations: Abdomen is soft.     Tenderness: There is no abdominal tenderness. There is no guarding or rebound.  Musculoskeletal: Normal range of motion.  Lymphadenopathy:     Cervical: No cervical adenopathy.  Skin:    General: Skin is warm and dry.     Findings: No rash.  Neurological:     Mental Status: He is alert and oriented to person, place, and time.     Cranial Nerves: No cranial nerve deficit.     Motor: No abnormal muscle tone.     Coordination: Coordination normal.     Deep Tendon Reflexes: Reflexes are normal and symmetric.     Comments: Mental Status:  Alert, oriented, thought content appropriate. Speech fluent without evidence of aphasia. Able to follow 2 step commands without difficulty.  Cranial Nerves:  II:  Peripheral visual fields grossly normal, pupils equal, round, reactive to light III,IV, VI: ptosis not present, extra-ocular motions intact bilaterally  V,VII: smile symmetric, facial light touch sensation equal VIII: hearing grossly normal bilaterally  IX,X: midline uvula rise  XI: bilateral shoulder shrug equal and strong XII: midline tongue extension  Motor:  5/5 in upper and lower extremities bilaterally including strong and equal  grip strength and dorsiflexion/plantar flexion Sensory: Pinprick and light touch normal in all extremities.  Deep  Tendon Reflexes: 2+ and symmetric  Cerebellar: normal finger-to-nose with bilateral upper extremities Gait: normal gait and balance CV: distal pulses palpable throughout   Psychiatric:        Behavior: Behavior normal.        Thought Content: Thought content normal.        Judgment: Judgment normal.      ED Treatments / Results  Labs (all labs ordered are listed, but only abnormal results are displayed) Labs Reviewed - No data to display  EKG None  Radiology Ct Head Wo Contrast  Result Date: 06/20/2019 CLINICAL DATA:  Head trauma. No complaints. EXAM: CT HEAD WITHOUT CONTRAST TECHNIQUE: Contiguous axial images were obtained from the base of the skull through the vertex without intravenous contrast. 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 Area of old infarct space along the left internal capsule is new compared to prior study. 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 This measures water density. No signs of intracranial hemorrhage. No signs of mass effect, midline shift or hydrocephalus. Signs of chronic microvascular ischemic change as before. No signs of edema Vascular: No hyperdense vessel or unexpected calcification. Skull: Normal. Negative for fracture or focal lesion. Sinuses/Orbits: Mastoid air cells are clear. No signs of paranasal sinus disease within the visualized sinuses. Orbits are unremarkable. Other: None. IMPRESSION: 1. 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 No acute intracranial abnormality. Electronically Signed   By: Zetta Bills M.D.   On: 06/20/2019 12:49    Procedures Procedures (including critical care time)  Medications Ordered in ED Medications - No data to display   Initial Impression / Assessment and Plan / ED Course    I have reviewed the triage vital signs and the nursing notes.  Pertinent labs & imaging results that were available during my care of the patient were reviewed by me and considered in my medical decision making (see chart for details).        Sent in from his dialysis center because he had a minor head injury.  He has no complaints, no obvious trauma to the head.  He has a normal neurologic examination.  I personally reviewed the patient's head CT which shows no acute abnormalities on my interpretation.  Patient denies shortness of breath.  Feel that he is stable to discharge to go back to dialysis center for his daily dialysis.  He appears appropriate for discharge at this time  Final Clinical Impressions(s) / ED Diagnoses   Final diagnoses:  Fall, initial encounter  Minor head injury, initial encounter    ED Discharge Orders    None       Margarita Mail, PA-C 06/20/19 1419    Fredia Sorrow, MD 06/22/19 [id number]

## 2019-06-20 NOTE — ED Triage Notes (Signed)
Pt states he tripped and fell yesterday and hit his head. Denies any complaints. States he went to dialysis this morning and they would not dialyze him until he had a head ct. Pt denies complaints.

## 2019-06-20 NOTE — Discharge Instructions (Addendum)
Get help right away if: You have: A severe headache that is not helped by medicine. Trouble walking or weakness in your arms and legs. Clear or bloody fluid coming from your nose or ears. Changes in your vision. A seizure. You lose your balance. You vomit. Your pupils change size. Your speech is slurred. Your dizziness gets worse. You faint. You are sleepier than normal and have trouble staying awake. Your symptoms get worse.

## 2019-07-03 ENCOUNTER — Emergency Department (HOSPITAL_COMMUNITY)
Admission: EM | Admit: 2019-07-03 | Discharge: 2019-07-03 | Disposition: A | Discharge status: Home / Self Care | Payer: Medicare Other | Attending: Emergency Medicine | Admitting: Emergency Medicine

## 2019-07-03 ENCOUNTER — Encounter (HOSPITAL_COMMUNITY): Payer: Self-pay | Admitting: Emergency Medicine

## 2019-07-03 ENCOUNTER — Emergency Department (HOSPITAL_COMMUNITY): Payer: Medicare Other

## 2019-07-03 ENCOUNTER — Other Ambulatory Visit: Payer: Self-pay

## 2019-07-03 DIAGNOSIS — I2510 Atherosclerotic heart disease of native coronary artery without angina pectoris: Secondary | ICD-10-CM | POA: Diagnosis not present

## 2019-07-03 DIAGNOSIS — R1011 Right upper quadrant pain: Secondary | ICD-10-CM | POA: Diagnosis present

## 2019-07-03 DIAGNOSIS — I132 Hypertensive heart and chronic kidney disease with heart failure and with stage 5 chronic kidney disease, or end stage renal disease: Secondary | ICD-10-CM | POA: Diagnosis not present

## 2019-07-03 DIAGNOSIS — R1010 Upper abdominal pain, unspecified: Secondary | ICD-10-CM | POA: Insufficient documentation

## 2019-07-03 DIAGNOSIS — Z992 Dependence on renal dialysis: Secondary | ICD-10-CM | POA: Diagnosis not present

## 2019-07-03 DIAGNOSIS — Z7982 Long term (current) use of aspirin: Secondary | ICD-10-CM | POA: Insufficient documentation

## 2019-07-03 DIAGNOSIS — N186 End stage renal disease: Secondary | ICD-10-CM | POA: Insufficient documentation

## 2019-07-03 DIAGNOSIS — E1122 Type 2 diabetes mellitus with diabetic chronic kidney disease: Secondary | ICD-10-CM | POA: Diagnosis not present

## 2019-07-03 DIAGNOSIS — Z79899 Other long term (current) drug therapy: Secondary | ICD-10-CM | POA: Insufficient documentation

## 2019-07-03 DIAGNOSIS — I509 Heart failure, unspecified: Secondary | ICD-10-CM | POA: Diagnosis not present

## 2019-07-03 DIAGNOSIS — Z87891 Personal history of nicotine dependence: Secondary | ICD-10-CM | POA: Insufficient documentation

## 2019-07-03 DIAGNOSIS — G8929 Other chronic pain: Secondary | ICD-10-CM | POA: Diagnosis not present

## 2019-07-03 LAB — CBC WITH DIFFERENTIAL/PLATELET
Abs Immature Granulocytes: 2.28 10*3/uL — ABNORMAL HIGH (ref 0.00–0.07)
Basophils Absolute: 0.3 10*3/uL — ABNORMAL HIGH (ref 0.0–0.1)
Basophils Relative: 4 %
Eosinophils Absolute: 0 10*3/uL (ref 0.0–0.5)
Eosinophils Relative: 0 %
HCT: 35.1 % — ABNORMAL LOW (ref 39.0–52.0)
Hemoglobin: 11.3 g/dL — ABNORMAL LOW (ref 13.0–17.0)
Immature Granulocytes: 25 %
Lymphocytes Relative: 20 %
Lymphs Abs: 1.9 10*3/uL (ref 0.7–4.0)
MCH: 31.7 pg (ref 26.0–34.0)
MCHC: 32.2 g/dL (ref 30.0–36.0)
MCV: 98.6 fL (ref 80.0–100.0)
Monocytes Absolute: 1.4 10*3/uL — ABNORMAL HIGH (ref 0.1–1.0)
Monocytes Relative: 15 %
Neutro Abs: 3.4 10*3/uL (ref 1.7–7.7)
Neutrophils Relative %: 36 %
Platelets: 189 10*3/uL (ref 150–400)
RBC: 3.56 MIL/uL — ABNORMAL LOW (ref 4.22–5.81)
RDW: 19.7 % — ABNORMAL HIGH (ref 11.5–15.5)
WBC: 9.3 10*3/uL (ref 4.0–10.5)
nRBC: 1.5 % — ABNORMAL HIGH (ref 0.0–0.2)

## 2019-07-03 LAB — COMPREHENSIVE METABOLIC PANEL
ALT: 36 U/L (ref 0–44)
AST: 58 U/L — ABNORMAL HIGH (ref 15–41)
Albumin: 3.1 g/dL — ABNORMAL LOW (ref 3.5–5.0)
Alkaline Phosphatase: 226 U/L — ABNORMAL HIGH (ref 38–126)
Anion gap: 15 (ref 5–15)
BUN: 30 mg/dL — ABNORMAL HIGH (ref 8–23)
CO2: 23 mmol/L (ref 22–32)
Calcium: 8.3 mg/dL — ABNORMAL LOW (ref 8.9–10.3)
Chloride: 100 mmol/L (ref 98–111)
Creatinine, Ser: 5.29 mg/dL — ABNORMAL HIGH (ref 0.61–1.24)
GFR calc Af Amer: 11 mL/min — ABNORMAL LOW (ref >60)
GFR calc non Af Amer: 10 mL/min — ABNORMAL LOW (ref >60)
Glucose, Bld: 132 mg/dL — ABNORMAL HIGH (ref 70–99)
Potassium: 4.1 mmol/L (ref 3.5–5.1)
Sodium: 138 mmol/L (ref 135–145)
Total Bilirubin: 0.7 mg/dL (ref 0.3–1.2)
Total Protein: 6 g/dL — ABNORMAL LOW (ref 6.5–8.1)

## 2019-07-03 LAB — URINALYSIS, ROUTINE W REFLEX MICROSCOPIC
Bilirubin Urine: NEGATIVE
Glucose, UA: 150 mg/dL — AB
Hgb urine dipstick: NEGATIVE
Ketones, ur: NEGATIVE mg/dL
Leukocytes,Ua: NEGATIVE
Nitrite: NEGATIVE
Protein, ur: >=300 mg/dL — AB
Specific Gravity, Urine: 1.019 (ref 1.005–1.030)
pH: 7 (ref 5.0–8.0)

## 2019-07-03 LAB — LIPASE, BLOOD: Lipase: 19 U/L (ref 11–51)

## 2019-07-03 LAB — D-DIMER, QUANTITATIVE: D-Dimer, Quant: 1.92 ug/mL-FEU — ABNORMAL HIGH (ref 0.00–0.50)

## 2019-07-03 LAB — BRAIN NATRIURETIC PEPTIDE: B Natriuretic Peptide: 563 pg/mL — ABNORMAL HIGH (ref 0.0–100.0)

## 2019-07-03 MED ORDER — ONDANSETRON HCL 4 MG/2ML IJ SOLN
4.0000 mg | Freq: Once | INTRAMUSCULAR | Status: AC
  Administered 2019-07-03: 4 mg via INTRAVENOUS
  Filled 2019-07-03: qty 2

## 2019-07-03 MED ORDER — FENTANYL CITRATE (PF) 100 MCG/2ML IJ SOLN
50.0000 ug | Freq: Once | INTRAMUSCULAR | Status: AC
  Administered 2019-07-03: 50 ug via INTRAVENOUS
  Filled 2019-07-03: qty 2

## 2019-07-03 MED ORDER — DIPHENHYDRAMINE HCL 50 MG/ML IJ SOLN
12.5000 mg | Freq: Once | INTRAMUSCULAR | Status: AC
  Administered 2019-07-03: 12.5 mg via INTRAVENOUS
  Filled 2019-07-03: qty 1

## 2019-07-03 MED ORDER — MORPHINE SULFATE (PF) 4 MG/ML IV SOLN
4.0000 mg | Freq: Once | INTRAVENOUS | Status: AC
  Administered 2019-07-03: 4 mg via INTRAVENOUS
  Filled 2019-07-03: qty 1

## 2019-07-03 MED ORDER — OXYCODONE-ACETAMINOPHEN 5-325 MG PO TABS: 1.0000 | ORAL_TABLET | ORAL | 0 refills | Status: DC | PRN

## 2019-07-03 MED ORDER — HYDROMORPHONE HCL 1 MG/ML IJ SOLN
1.0000 mg | Freq: Once | INTRAMUSCULAR | Status: AC
  Administered 2019-07-03: 1 mg via INTRAVENOUS
  Filled 2019-07-03: qty 1

## 2019-07-03 NOTE — ED Notes (Addendum)
When pt came back from CT O2 sat was 87%, pt placed on 2L per [place], O2 came back up to 96%, will continue to monitor pts breathing.

## 2019-07-03 NOTE — ED Triage Notes (Signed)
Pt reports RLQ pain started earlier today. Reports hx of kidney stones. Still has appendix.

## 2019-07-03 NOTE — ED Provider Notes (Signed)
[facility] Provider Note   CSN: [id number] Arrival date & time: 07/03/19  1626     History Chief Complaint  Patient presents with  . Abdominal Pain    [name] is a 73 y.o. male with a history of CAD, CHF, end-stage renal disease on dialysis, hypertension, GERD history of kidney stones,, type 2 diabetes and history of splenomegaly presenting with sharp stabbing pain in his right upper abdomen and flank which started around 1 PM today.  His pain is constant, worsened with palpation and deep inspiration.  He denies shortness of breath but states he is taking shallow breaths to minimize pain.  He endorses nausea without emesis, no fevers or chills, no diarrhea.  He and his wife have both noted his abdomen has become very distended this afternoon since onset of this pain.  He denies constipation and has been passing flatus.  He has had no treatment for his symptoms prior to arrival.  He denies chest pain and shortness of breath, no dysuria or hematuria.  The history is provided by the patient.       Past Medical History:  Diagnosis Date  . Anxiety   . Arthritis   . BPH (benign prostatic hyperplasia)   . CAD (coronary artery disease)    DES to circumflex 07/2016, moderate residual LAD and RCA, small 90% OM3 - managed medically  . CHF (congestive heart failure) ([place])   . Chronic lower back pain   . Depression   . ESRD (end stage renal disease) ([place])    Hemo MWF [facility]  . Essential hypertension   . Gastritis   . GERD (gastroesophageal reflux disease)   . History of kidney stones   . Leukemia ([place])   . Leukocytosis   . 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 Pleural effusion    had Thorancentesis at [facility]  . Pneumonia    last time 08/2018- Aspiration Pneumonia - unintentional opiate overdose  . Renal insufficiency   . Spleen enlarged   . Thrombocythemia  ([place])   . Type II diabetes mellitus [facility])     Patient Active Problem List   Diagnosis Date Noted  . Intractable pain 04/19/2019  . Elevated d-dimer 04/19/2019  . RUQ abdominal pain 11/18/2017  . Acute CHF (congestive heart failure) ([place]) 09/15/2016  . Essential hypertension 08/14/2016  . Headache 08/14/2016  . Hyperlipidemia LDL goal <70 08/14/2016  . Abnormal nuclear stress test   . 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 Acute respiratory failure with hypoxia ([place]) 04/27/2016  . Hypoxia   . Hematoma 04/25/2016  . Mass of chest wall, left   . Abnormal partial thromboplastin time (PTT)   . 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 Coronary artery disease due to lipid rich plaque 04/15/2016  . Leukocytosis 04/15/2016  . Diabetes mellitus ([place]) 04/15/2016  . Pleuritic pain 04/15/2016  . ESRD on hemodialysis ([place]) 04/15/2016  . Left shoulder pain 04/15/2016  . History of adenomatous polyp of colon 01/21/2013  . GERD (gastroesophageal reflux disease) 01/21/2013    Past Surgical History:  Procedure Laterality Date  . AV FISTULA PLACEMENT Left 04/14/2019   Procedure: ARTERIOVENOUS (AV) FISTULA CREATION LEFT ARM;  Surgeon: Rosetta Posner, MD;  Location: [place];  Service: Vascular;  Laterality: Left;  . BIOPSY  12/08/2017   Procedure: BIOPSY;  Surgeon: Danie Binder, MD;  [facility];  Service:  Endoscopy;;  gastric  . BONE MARROW BIOPSY     x 3 times  . CARDIAC CATHETERIZATION N/A 08/12/2016   Procedure: Left Heart Cath and Coronary Angiography;  Surgeon: Jettie Booze, MD;  Location: [place] CV LAB;  Service: Cardiovascular;  Laterality: N/A;  . CARDIAC CATHETERIZATION N/A 08/12/2016   Procedure: Coronary Stent Intervention;  Surgeon: Jettie Booze, MD;  Location: [place] CV LAB;  Service: Cardiovascular;  Laterality: N/A;  . CARDIAC CATHETERIZATION  2000s X 1  . COLONOSCOPY WITH PROPOFOL N/A 02/01/2013   SLF: 1. 23 colon polyps removed. 2 retrieved. 2.  Mild diverticulosis in teh sigmoid colon 3. Small internal hemorrhoids 4. The colon is redundant   . COLONOSCOPY WITH PROPOFOL N/A 12/08/2017   Procedure: COLONOSCOPY WITH PROPOFOL;  Surgeon: Danie Binder, MD;  [facility];  Service: Endoscopy;  Laterality: N/A;  1:45pm  . CYSTOSCOPY W/ STONE MANIPULATION    . ESOPHAGOGASTRODUODENOSCOPY (EGD) WITH PROPOFOL N/A 02/01/2013   SLF: 1. Moderate erosive gastritis  . ESOPHAGOGASTRODUODENOSCOPY (EGD) WITH PROPOFOL N/A 12/08/2017   Procedure: ESOPHAGOGASTRODUODENOSCOPY (EGD) WITH PROPOFOL;  Surgeon: Danie Binder, MD;  [facility];  Service: Endoscopy;  Laterality: N/A;  . LUMBAR [place]  . POLYPECTOMY N/A 02/01/2013   Procedure: POLYPECTOMY;  Surgeon: Danie Binder, MD;  Location: [facility];  Service: Endoscopy;  Laterality: N/A;       Family History  Problem Relation Age of Onset  . Heart attack Mother   . CVA Mother   . Heart attack Father   . Colon cancer Neg Hx     Social History   Tobacco Use  . Smoking status: Former Smoker    Packs/day: 1.00    Years: 20.00    Pack years: 20.00    Types: Cigars    Start date: 06/25/1975    Quit date: 08/04/2013    Years since quitting: 5.9  . Smokeless tobacco: Never Used  Substance Use Topics  . Alcohol use: Not Currently    Comment: Remote history of ETOH abuse, "when I was young and dumb"   . Drug use: No    Home Medications Prior to Admission medications   Medication Sig Start Date End Date Taking? Authorizing Provider  acetaminophen (TYLENOL) 500 MG tablet Take 1,000 mg by mouth 2 (two) times daily as needed for mild pain or moderate pain.     [provider]  allopurinol (ZYLOPRIM) 100 MG tablet Take 200 mg by mouth every morning.    [provider]  aspirin EC 81 MG EC tablet Take 1 tablet (81 mg total) by mouth daily with breakfast. 04/21/19   Denton Brick, Cour[place], MD  calcium acetate (PHOSLO) 667 MG capsule Take 667 mg by mouth 3  (three) times daily with meals.    [provider]  DULoxetine (CYMBALTA) 60 MG capsule Take 1 capsule (60 mg total) by mouth daily. 04/21/19   Roxan Hockey, MD  gabapentin (NEURONTIN) 100 MG capsule Take 1 capsule (100 mg total) by mouth at bedtime. 04/21/19   Roxan Hockey, MD  hydrALAZINE (APRESOLINE) 50 MG tablet Take 2 tablets (100 mg total) by mouth 3 (three) times daily. Patient taking differently: Take 50 mg by mouth 3 (three) times daily.  10/20/18   Arnoldo Lenis, MD  hydrOXYzine (ATARAX/VISTARIL) 25 MG tablet Take 1 tablet (25 mg total) by mouth every 8 (eight) hours as needed for anxiety, itching or nausea. 04/21/19   Roxan Hockey, MD  methocarbamol (ROBAXIN) 750 MG tablet Take 1 tablet (750 mg total) by mouth 3 (three) times daily. For muscle pain/spasm 04/21/19   Roxan Hockey, MD  metoprolol tartrate (LOPRESSOR) 25 MG tablet Take 1 tablet (25 mg total) by mouth 2 (two) times daily. 04/21/19   Roxan Hockey, MD  nitroGLYCERIN (NITROSTAT) 0.4 MG SL tablet Place 1 tablet (0.4 mg total) under the tongue every 5 (five) minutes x 3 doses as needed for chest pain (if no relief after 3rd dose, proceed to the ED for an evaluation or call 911). 10/04/18   Arnoldo Lenis, MD  oxyCODONE-acetaminophen (PERCOCET/ROXICET) 5-325 MG tablet Take 1 tablet by mouth every 4 (four) hours as needed. 07/03/19   Evalee Jefferson, PA-C  ruxolitinib phosphate (JAKAFI) 5 MG tablet Take 5 mg by mouth daily.    [provider]  traZODone (DESYREL) 50 MG tablet Take 1 tablet (50 mg total) by mouth at bedtime as needed for sleep. 04/21/19   Roxan Hockey, MD    Allergies    Hydrocodone  Review of Systems   Review of Systems  Constitutional: Negative for chills and fever.  HENT: Negative for congestion and sore throat.   Eyes: Negative.   Respiratory: Negative for chest tightness and shortness of breath.   Cardiovascular: Negative for chest pain, palpitations and leg swelling.    Gastrointestinal: Positive for abdominal distention, abdominal pain and nausea. Negative for diarrhea and vomiting.  Genitourinary: Negative.  Negative for dysuria and hematuria.  Musculoskeletal: Negative for arthralgias, joint swelling and neck pain.  Skin: Negative.  Negative for rash and wound.  Neurological: Negative for dizziness, weakness, light-headedness, numbness and headaches.  Psychiatric/Behavioral: Negative.     Physical Exam Updated Vital Signs BP (!) 163/90 (BP Location: Right Arm)   Pulse 69   Temp 98.9 F (37.2 C) (Oral)   Resp 19   Ht 6' (1.829 m)   Wt 83 kg   SpO2 96%   BMI 24.82 kg/m   Physical Exam Vitals and nursing note reviewed.  Constitutional:      Appearance: He is well-developed.  HENT:     Head: Normocephalic and atraumatic.  Eyes:     Conjunctiva/sclera: Conjunctivae normal.  Cardiovascular:     Rate and Rhythm: Normal rate and regular rhythm.     Heart sounds: Normal heart sounds.  Pulmonary:     Effort: Pulmonary effort is normal.     Breath sounds: Normal breath sounds. No wheezing.  Abdominal:     General: Bowel sounds are normal.     Palpations: Abdomen is soft.     Tenderness: There is abdominal tenderness in the right upper quadrant. There is right CVA tenderness. There is no left CVA tenderness or guarding. Negative signs include Murphy's sign.  Musculoskeletal:        General: Normal range of motion.     Cervical back: Normal range of motion.  Skin:    General: Skin is warm and dry.  Neurological:     General: No focal deficit present.     Mental Status: He is alert.     ED Results / Procedures / Treatments   Labs (all labs ordered are listed, but only abnormal results are displayed) Labs Reviewed  CBC WITH DIFFERENTIAL/PLATELET - Abnormal; Notable for the following components:      Result Value   RBC 3.56 (*)    Hemoglobin 11.3 (*)    HCT 35.1 (*)    RDW 19.7 (*)    nRBC 1.5 (*)  Monocytes Absolute 1.4 (*)     Basophils Absolute 0.3 (*)    Abs Immature Granulocytes 2.28 (*)    All other components within normal limits  COMPREHENSIVE METABOLIC PANEL - Abnormal; Notable for the following components:   Glucose, Bld 132 (*)    BUN 30 (*)    Creatinine, Ser 5.29 (*)    Calcium 8.3 (*)    Total Protein 6.0 (*)    Albumin 3.1 (*)    AST 58 (*)    Alkaline Phosphatase 226 (*)    GFR calc non Af Amer 10 (*)    GFR calc Af Amer 11 (*)    All other components within normal limits  URINALYSIS, ROUTINE W REFLEX MICROSCOPIC - Abnormal; Notable for the following components:   Glucose, UA 150 (*)    Protein, ur >=300 (*)    Bacteria, UA RARE (*)    All other components within normal limits  D-DIMER, QUANTITATIVE (NOT AT [facility]) - Abnormal; Notable for the following components:   D-Dimer, Quant 1.92 (*)    All other components within normal limits  BRAIN NATRIURETIC PEPTIDE - Abnormal; Notable for the following components:   B Natriuretic Peptide 563.0 (*)    All other components within normal limits  LIPASE, BLOOD    EKG None  Radiology CT ABDOMEN PELVIS WO CONTRAST  Result Date: 07/03/2019 CLINICAL DATA:  Abdominal pain. Bowel obstruction suspected. History of leukemia and kidney stones. Currently on dialysis. EXAM: CT ABDOMEN AND PELVIS WITHOUT CONTRAST TECHNIQUE: Multidetector CT imaging of the abdomen and pelvis was performed following the standard protocol without IV contrast. COMPARISON:  04/19/2019 FINDINGS: Lower chest: There are small bilateral pleural effusions. There is adjacent compressive atelectasis.The heart size is enlarged. A dialysis catheter is partially visualized extending into the right atrium. There is interlobular septal thickening. Hepatobiliary: The liver is enlarged. Normal gallbladder.There is no biliary ductal dilation. Pancreas: Normal contours without ductal dilatation. No peripancreatic fluid collection. Spleen: The spleen is significantly enlarged. Adrenals/Urinary Tract:  --Adrenal glands: No adrenal hemorrhage. --Right kidney/ureter: There is cortical thinning of the right kidney without evidence for hydronephrosis --Left kidney/ureter: . There is cortical thinning of the left kidney without evidence for hydronephrosis. --Urinary bladder: The bladder is decompressed and therefore is poorly evaluated on this exam. Stomach/Bowel: --Stomach/Duodenum: No hiatal hernia or other gastric abnormality. Normal duodenal course and caliber. --Small bowel: No dilatation or inflammation. --Colon: No focal abnormality. --Appendix: Normal. Vascular/Lymphatic: Atherosclerotic calcification is present within the non-aneurysmal abdominal aorta, without hemodynamically significant stenosis. --No retroperitoneal lymphadenopathy. --No mesenteric lymphadenopathy. --No pelvic or inguinal lymphadenopathy. Reproductive: Unremarkable Other: There is a small volume of abdominal ascites. There are bilateral fat containing inguinal hernias. There is mild body wall edema. Musculoskeletal. No acute displaced fractures. IMPRESSION: 1. No acute abdominal or pelvic pathology. No evidence for small bowel obstruction. 2. Hepatosplenomegaly with small volume ascites. 3. Small bilateral pleural effusions with adjacent compressive atelectasis. There is interlobular septal thickening with body wall edema, consistent with volume overload and possible developing interstitial edema. Aortic Atherosclerosis (ICD10-I70.0). Electronically Signed   By: Constance Holster M.D.   On: 07/03/2019 20:18    Procedures Procedures (including critical care time)  Medications Ordered in ED Medications  morphine 4 MG/ML injection 4 mg (4 mg Intravenous Given 07/03/19 1732)  ondansetron (ZOFRAN) injection 4 mg (4 mg Intravenous Given 07/03/19 1732)  fentaNYL (SUBLIMAZE) injection 50 mcg (50 mcg Intravenous Given 07/03/19 1919)  HYDROmorphone (DILAUDID) injection 1 mg (1 mg Intravenous Given 07/03/19  2139)  diphenhydrAMINE  (BENADRYL) injection 12.5 mg (12.5 mg Intravenous Given 07/03/19 2213)    ED Course  I have reviewed the triage vital signs and the nursing notes.  Pertinent labs & imaging results that were available during my care of the patient were reviewed by me and considered in my medical decision making (see chart for details).    MDM Rules/Calculators/A&P                      Review of pt's chart - he is followed by Dr. Florene Glen with hem/onc at [facility] - last note dated 05/19/19 discussing his chronic RUQ pain.  Discussed with pt who endorses that this pain is chronic and no one has been able to find out the source. He used to be in pain management which was not not helpful. At last visit with Dr. Florene Glen, palliative referral was given.  He endorses he takes hydrocodone daily for this pain but is not helpful on severe days.  He took 2 this am without relief.  Hepatosplenomegaly is chronic. Also upon review of chart he has a chronically elevated d dimer.  He has no sob and VSS here.  Doubt PE.  He was given oxycodone for pain, advised f/u with his pcp for a recheck and further management of his chronic pain.     Final Clinical Impression(s) / ED Diagnoses Final diagnoses:  Chronic upper abdominal pain    Rx / DC Orders ED Discharge Orders         Ordered    oxyCODONE-acetaminophen (PERCOCET/ROXICET) 5-325 MG tablet  Every 4 hours PRN     07/03/19 2207           Evalee Jefferson, PA-C 07/03/19 2224    Long, [facility], MD 07/03/19 2320

## 2019-07-03 NOTE — Discharge Instructions (Addendum)
As discussed, you may try taking the oxycodone in place of your hydrocodone which may give better pain relief of your pain.   Plan to see your MD if for a recheck if your symptoms.

## 2019-07-18 ENCOUNTER — Other Ambulatory Visit (HOSPITAL_COMMUNITY): Payer: Self-pay | Admitting: Nephrology

## 2019-07-18 DIAGNOSIS — N186 End stage renal disease: Secondary | ICD-10-CM

## 2019-07-18 DIAGNOSIS — Z992 Dependence on renal dialysis: Secondary | ICD-10-CM

## 2019-07-23 ENCOUNTER — Inpatient Hospital Stay (HOSPITAL_COMMUNITY)
Admission: EM | Admit: 2019-07-23 | Discharge: 2019-07-26 | DRG: 177 | Disposition: A | Discharge status: Home / Self Care | Payer: Medicare Other | Attending: Internal Medicine | Admitting: Internal Medicine

## 2019-07-23 ENCOUNTER — Other Ambulatory Visit: Payer: Self-pay

## 2019-07-23 ENCOUNTER — Emergency Department (HOSPITAL_COMMUNITY): Payer: Medicare Other

## 2019-07-23 DIAGNOSIS — Z992 Dependence on renal dialysis: Secondary | ICD-10-CM

## 2019-07-23 DIAGNOSIS — N400 Enlarged prostate without lower urinary tract symptoms: Secondary | ICD-10-CM | POA: Diagnosis present

## 2019-07-23 DIAGNOSIS — I5032 Chronic diastolic (congestive) heart failure: Secondary | ICD-10-CM | POA: Diagnosis present

## 2019-07-23 DIAGNOSIS — U071 COVID-19: Secondary | ICD-10-CM | POA: Diagnosis not present

## 2019-07-23 DIAGNOSIS — Z86010 Personal history of colonic polyps: Secondary | ICD-10-CM

## 2019-07-23 DIAGNOSIS — R0602 Shortness of breath: Secondary | ICD-10-CM | POA: Diagnosis not present

## 2019-07-23 DIAGNOSIS — G8929 Other chronic pain: Secondary | ICD-10-CM | POA: Diagnosis present

## 2019-07-23 DIAGNOSIS — C946 Myelodysplastic disease, not classified: Secondary | ICD-10-CM | POA: Diagnosis present

## 2019-07-23 DIAGNOSIS — Z885 Allergy status to narcotic agent status: Secondary | ICD-10-CM

## 2019-07-23 DIAGNOSIS — M545 Low back pain: Secondary | ICD-10-CM | POA: Diagnosis present

## 2019-07-23 DIAGNOSIS — I2583 Coronary atherosclerosis due to lipid rich plaque: Secondary | ICD-10-CM | POA: Diagnosis not present

## 2019-07-23 DIAGNOSIS — Z87442 Personal history of urinary calculi: Secondary | ICD-10-CM | POA: Diagnosis not present

## 2019-07-23 DIAGNOSIS — Z79891 Long term (current) use of opiate analgesic: Secondary | ICD-10-CM

## 2019-07-23 DIAGNOSIS — Z79899 Other long term (current) drug therapy: Secondary | ICD-10-CM | POA: Diagnosis not present

## 2019-07-23 DIAGNOSIS — N250 Renal osteodystrophy: Secondary | ICD-10-CM | POA: Diagnosis not present

## 2019-07-23 DIAGNOSIS — Z79890 Hormone replacement therapy (postmenopausal): Secondary | ICD-10-CM | POA: Diagnosis not present

## 2019-07-23 DIAGNOSIS — I10 Essential (primary) hypertension: Secondary | ICD-10-CM | POA: Diagnosis not present

## 2019-07-23 DIAGNOSIS — F419 Anxiety disorder, unspecified: Secondary | ICD-10-CM | POA: Diagnosis present

## 2019-07-23 DIAGNOSIS — Z87891 Personal history of nicotine dependence: Secondary | ICD-10-CM

## 2019-07-23 DIAGNOSIS — Z8249 Family history of ischemic heart disease and other diseases of the circulatory system: Secondary | ICD-10-CM | POA: Diagnosis not present

## 2019-07-23 DIAGNOSIS — I132 Hypertensive heart and chronic kidney disease with heart failure and with stage 5 chronic kidney disease, or end stage renal disease: Secondary | ICD-10-CM | POA: Diagnosis present

## 2019-07-23 DIAGNOSIS — E11649 Type 2 diabetes mellitus with hypoglycemia without coma: Secondary | ICD-10-CM | POA: Diagnosis present

## 2019-07-23 DIAGNOSIS — E8770 Fluid overload, unspecified: Secondary | ICD-10-CM | POA: Diagnosis not present

## 2019-07-23 DIAGNOSIS — D471 Chronic myeloproliferative disease: Secondary | ICD-10-CM | POA: Diagnosis present

## 2019-07-23 DIAGNOSIS — E1122 Type 2 diabetes mellitus with diabetic chronic kidney disease: Secondary | ICD-10-CM | POA: Diagnosis present

## 2019-07-23 DIAGNOSIS — N186 End stage renal disease: Secondary | ICD-10-CM | POA: Diagnosis present

## 2019-07-23 DIAGNOSIS — I120 Hypertensive chronic kidney disease with stage 5 chronic kidney disease or end stage renal disease: Secondary | ICD-10-CM | POA: Diagnosis not present

## 2019-07-23 DIAGNOSIS — D631 Anemia in chronic kidney disease: Secondary | ICD-10-CM | POA: Diagnosis not present

## 2019-07-23 DIAGNOSIS — I2510 Atherosclerotic heart disease of native coronary artery without angina pectoris: Secondary | ICD-10-CM | POA: Diagnosis not present

## 2019-07-23 DIAGNOSIS — E119 Type 2 diabetes mellitus without complications: Secondary | ICD-10-CM

## 2019-07-23 DIAGNOSIS — K219 Gastro-esophageal reflux disease without esophagitis: Secondary | ICD-10-CM | POA: Diagnosis present

## 2019-07-23 DIAGNOSIS — J9601 Acute respiratory failure with hypoxia: Secondary | ICD-10-CM | POA: Diagnosis present

## 2019-07-23 DIAGNOSIS — Z955 Presence of coronary angioplasty implant and graft: Secondary | ICD-10-CM

## 2019-07-23 DIAGNOSIS — R5383 Other fatigue: Secondary | ICD-10-CM | POA: Diagnosis not present

## 2019-07-23 DIAGNOSIS — F329 Major depressive disorder, single episode, unspecified: Secondary | ICD-10-CM | POA: Diagnosis present

## 2019-07-23 DIAGNOSIS — Z7982 Long term (current) use of aspirin: Secondary | ICD-10-CM

## 2019-07-23 DIAGNOSIS — J9600 Acute respiratory failure, unspecified whether with hypoxia or hypercapnia: Secondary | ICD-10-CM

## 2019-07-23 DIAGNOSIS — R1084 Generalized abdominal pain: Secondary | ICD-10-CM | POA: Diagnosis not present

## 2019-07-23 DIAGNOSIS — J1282 Pneumonia due to coronavirus disease 2019: Secondary | ICD-10-CM | POA: Diagnosis present

## 2019-07-23 DIAGNOSIS — J069 Acute upper respiratory infection, unspecified: Secondary | ICD-10-CM | POA: Diagnosis present

## 2019-07-23 LAB — CBC WITH DIFFERENTIAL/PLATELET
Abs Immature Granulocytes: 2.11 10*3/uL — ABNORMAL HIGH (ref 0.00–0.07)
Basophils Absolute: 0.3 10*3/uL — ABNORMAL HIGH (ref 0.0–0.1)
Basophils Relative: 4 %
Eosinophils Absolute: 0 10*3/uL (ref 0.0–0.5)
Eosinophils Relative: 0 %
HCT: 33.8 % — ABNORMAL LOW (ref 39.0–52.0)
Hemoglobin: 11 g/dL — ABNORMAL LOW (ref 13.0–17.0)
Immature Granulocytes: 28 %
Lymphocytes Relative: 19 %
Lymphs Abs: 1.4 10*3/uL (ref 0.7–4.0)
MCH: 31.1 pg (ref 26.0–34.0)
MCHC: 32.5 g/dL (ref 30.0–36.0)
MCV: 95.5 fL (ref 80.0–100.0)
Monocytes Absolute: 1 10*3/uL (ref 0.1–1.0)
Monocytes Relative: 13 %
Neutro Abs: 2.8 10*3/uL (ref 1.7–7.7)
Neutrophils Relative %: 36 %
Platelets: 129 10*3/uL — ABNORMAL LOW (ref 150–400)
RBC: 3.54 MIL/uL — ABNORMAL LOW (ref 4.22–5.81)
RDW: 19.1 % — ABNORMAL HIGH (ref 11.5–15.5)
WBC Morphology: ABNORMAL
WBC: 7.5 10*3/uL (ref 4.0–10.5)
nRBC: 5.3 % — ABNORMAL HIGH (ref 0.0–0.2)

## 2019-07-23 LAB — URINALYSIS, ROUTINE W REFLEX MICROSCOPIC
Bacteria, UA: NONE SEEN
Bilirubin Urine: NEGATIVE
Glucose, UA: >=500 mg/dL — AB
Hgb urine dipstick: NEGATIVE
Ketones, ur: 20 mg/dL — AB
Leukocytes,Ua: NEGATIVE
Nitrite: NEGATIVE
Protein, ur: >=300 mg/dL — AB
Specific Gravity, Urine: 1.033 — ABNORMAL HIGH (ref 1.005–1.030)
pH: 7 (ref 5.0–8.0)

## 2019-07-23 LAB — COMPREHENSIVE METABOLIC PANEL
ALT: 28 U/L (ref 0–44)
AST: 47 U/L — ABNORMAL HIGH (ref 15–41)
Albumin: 2.6 g/dL — ABNORMAL LOW (ref 3.5–5.0)
Alkaline Phosphatase: 187 U/L — ABNORMAL HIGH (ref 38–126)
Anion gap: 12 (ref 5–15)
BUN: 34 mg/dL — ABNORMAL HIGH (ref 8–23)
CO2: 24 mmol/L (ref 22–32)
Calcium: 7.9 mg/dL — ABNORMAL LOW (ref 8.9–10.3)
Chloride: 99 mmol/L (ref 98–111)
Creatinine, Ser: 5.52 mg/dL — ABNORMAL HIGH (ref 0.61–1.24)
GFR calc Af Amer: 11 mL/min — ABNORMAL LOW (ref >60)
GFR calc non Af Amer: 9 mL/min — ABNORMAL LOW (ref >60)
Glucose, Bld: 95 mg/dL (ref 70–99)
Potassium: 4 mmol/L (ref 3.5–5.1)
Sodium: 135 mmol/L (ref 135–145)
Total Bilirubin: 1 mg/dL (ref 0.3–1.2)
Total Protein: 5.6 g/dL — ABNORMAL LOW (ref 6.5–8.1)

## 2019-07-23 LAB — D-DIMER, QUANTITATIVE: D-Dimer, Quant: 1.59 ug/mL-FEU — ABNORMAL HIGH (ref 0.00–0.50)

## 2019-07-23 LAB — PROCALCITONIN: Procalcitonin: 1.73 ng/mL

## 2019-07-23 LAB — C-REACTIVE PROTEIN: CRP: 11.3 mg/dL — ABNORMAL HIGH (ref <1.0)

## 2019-07-23 LAB — GLUCOSE, CAPILLARY: Glucose-Capillary: 122 mg/dL — ABNORMAL HIGH (ref 70–99)

## 2019-07-23 LAB — MAGNESIUM: Magnesium: 1.7 mg/dL (ref 1.7–2.4)

## 2019-07-23 LAB — POC SARS CORONAVIRUS 2 AG -  ED: SARS Coronavirus 2 Ag: POSITIVE — AB

## 2019-07-23 LAB — TROPONIN I (HIGH SENSITIVITY)
Troponin I (High Sensitivity): 38 ng/L — ABNORMAL HIGH (ref <18)
Troponin I (High Sensitivity): 50 ng/L — ABNORMAL HIGH (ref <18)

## 2019-07-23 LAB — LACTATE DEHYDROGENASE: LDH: 1162 U/L — ABNORMAL HIGH (ref 98–192)

## 2019-07-23 MED ORDER — HYDRALAZINE HCL 25 MG PO TABS
50.0000 mg | ORAL_TABLET | Freq: Three times a day (TID) | ORAL | Status: DC
  Administered 2019-07-23 – 2019-07-25 (×6): 50 mg via ORAL
  Filled 2019-07-23 (×8): qty 2

## 2019-07-23 MED ORDER — TRAZODONE HCL 50 MG PO TABS
50.0000 mg | ORAL_TABLET | Freq: Every evening | ORAL | Status: DC | PRN
  Administered 2019-07-24 – 2019-07-25 (×2): 50 mg via ORAL
  Filled 2019-07-23 (×2): qty 1

## 2019-07-23 MED ORDER — INSULIN ASPART 100 UNIT/ML ~~LOC~~ SOLN
0.0000 [IU] | SUBCUTANEOUS | Status: DC
  Administered 2019-07-23: 2 [IU] via SUBCUTANEOUS
  Administered 2019-07-24: 3 [IU] via SUBCUTANEOUS
  Administered 2019-07-24: 5 [IU] via SUBCUTANEOUS
  Administered 2019-07-24: 2 [IU] via SUBCUTANEOUS
  Administered 2019-07-24: 3 [IU] via SUBCUTANEOUS

## 2019-07-23 MED ORDER — SODIUM CHLORIDE 0.9 % IV SOLN
100.0000 mg | Freq: Every day | INTRAVENOUS | Status: DC
  Administered 2019-07-24 – 2019-07-26 (×3): 100 mg via INTRAVENOUS
  Filled 2019-07-23: qty 20
  Filled 2019-07-23 (×2): qty 100
  Filled 2019-07-23 (×3): qty 20

## 2019-07-23 MED ORDER — HYDROXYZINE HCL 25 MG PO TABS
25.0000 mg | ORAL_TABLET | Freq: Three times a day (TID) | ORAL | Status: DC | PRN
  Administered 2019-07-24: 25 mg via ORAL
  Filled 2019-07-23: qty 1

## 2019-07-23 MED ORDER — OXYCODONE-ACETAMINOPHEN 5-325 MG PO TABS
1.0000 | ORAL_TABLET | ORAL | Status: DC | PRN
  Administered 2019-07-24 – 2019-07-26 (×7): 1 via ORAL
  Filled 2019-07-23 (×7): qty 1

## 2019-07-23 MED ORDER — HEPARIN SODIUM (PORCINE) 5000 UNIT/ML IJ SOLN
5000.0000 [IU] | Freq: Three times a day (TID) | INTRAMUSCULAR | Status: DC
  Administered 2019-07-23 – 2019-07-25 (×5): 5000 [IU] via SUBCUTANEOUS
  Filled 2019-07-23 (×7): qty 1

## 2019-07-23 MED ORDER — FUROSEMIDE 40 MG PO TABS
40.0000 mg | ORAL_TABLET | Freq: Two times a day (BID) | ORAL | Status: DC
  Administered 2019-07-23 – 2019-07-25 (×4): 40 mg via ORAL
  Filled 2019-07-23 (×6): qty 1

## 2019-07-23 MED ORDER — METOPROLOL TARTRATE 25 MG PO TABS
25.0000 mg | ORAL_TABLET | Freq: Two times a day (BID) | ORAL | Status: DC
  Administered 2019-07-23 – 2019-07-26 (×5): 25 mg via ORAL
  Filled 2019-07-23 (×6): qty 1

## 2019-07-23 MED ORDER — DULOXETINE HCL 60 MG PO CPEP
60.0000 mg | ORAL_CAPSULE | Freq: Every day | ORAL | Status: DC
  Administered 2019-07-24 – 2019-07-26 (×3): 60 mg via ORAL
  Filled 2019-07-23 (×3): qty 1

## 2019-07-23 MED ORDER — ASPIRIN EC 81 MG PO TBEC
81.0000 mg | DELAYED_RELEASE_TABLET | Freq: Every day | ORAL | Status: DC
  Administered 2019-07-24: 81 mg via ORAL
  Filled 2019-07-23 (×5): qty 1

## 2019-07-23 MED ORDER — INSULIN DETEMIR 100 UNIT/ML ~~LOC~~ SOLN
0.0750 [IU]/kg | Freq: Two times a day (BID) | SUBCUTANEOUS | Status: DC
  Administered 2019-07-23 – 2019-07-26 (×5): 7 [IU] via SUBCUTANEOUS
  Filled 2019-07-23 (×8): qty 0.07

## 2019-07-23 MED ORDER — METHOCARBAMOL 500 MG PO TABS
750.0000 mg | ORAL_TABLET | Freq: Three times a day (TID) | ORAL | Status: DC
  Administered 2019-07-23 – 2019-07-26 (×8): 750 mg via ORAL
  Filled 2019-07-23 (×8): qty 2

## 2019-07-23 MED ORDER — GUAIFENESIN-DM 100-10 MG/5ML PO SYRP: 10.0000 mL | ORAL_SOLUTION | ORAL | Status: DC | PRN

## 2019-07-23 MED ORDER — DEXAMETHASONE 4 MG PO TABS
6.0000 mg | ORAL_TABLET | ORAL | Status: DC
  Administered 2019-07-23 – 2019-07-25 (×3): 6 mg via ORAL
  Filled 2019-07-23 (×3): qty 2

## 2019-07-23 MED ORDER — CALCIUM ACETATE (PHOS BINDER) 667 MG PO CAPS
667.0000 mg | ORAL_CAPSULE | Freq: Three times a day (TID) | ORAL | Status: DC
  Administered 2019-07-24 – 2019-07-26 (×7): 667 mg via ORAL
  Filled 2019-07-23 (×8): qty 1

## 2019-07-23 MED ORDER — ONDANSETRON HCL 4 MG/2ML IJ SOLN: 4.0000 mg | Freq: Four times a day (QID) | INTRAMUSCULAR | Status: DC | PRN

## 2019-07-23 MED ORDER — PRAVASTATIN SODIUM 40 MG PO TABS
40.0000 mg | ORAL_TABLET | Freq: Every day | ORAL | Status: DC
  Administered 2019-07-24 – 2019-07-26 (×3): 40 mg via ORAL
  Filled 2019-07-23 (×3): qty 1

## 2019-07-23 MED ORDER — ONDANSETRON HCL 4 MG PO TABS
4.0000 mg | ORAL_TABLET | Freq: Four times a day (QID) | ORAL | Status: DC | PRN
  Administered 2019-07-24: 4 mg via ORAL
  Filled 2019-07-23: qty 1

## 2019-07-23 MED ORDER — HYDROCOD POLST-CPM POLST ER 10-8 MG/5ML PO SUER: 5.0000 mL | Freq: Two times a day (BID) | ORAL | Status: DC | PRN

## 2019-07-23 MED ORDER — DEXAMETHASONE SODIUM PHOSPHATE 10 MG/ML IJ SOLN
10.0000 mg | Freq: Once | INTRAMUSCULAR | Status: AC
  Administered 2019-07-23: 10 mg via INTRAVENOUS
  Filled 2019-07-23: qty 1

## 2019-07-23 MED ORDER — SODIUM CHLORIDE 0.9 % IV SOLN
200.0000 mg | Freq: Once | INTRAVENOUS | Status: AC
  Administered 2019-07-23: 200 mg via INTRAVENOUS
  Filled 2019-07-23: qty 40

## 2019-07-23 MED ORDER — GABAPENTIN 100 MG PO CAPS
100.0000 mg | ORAL_CAPSULE | Freq: Every day | ORAL | Status: DC
  Administered 2019-07-23 – 2019-07-25 (×3): 100 mg via ORAL
  Filled 2019-07-23 (×3): qty 1

## 2019-07-23 NOTE — ED Provider Notes (Signed)
[facility] EMERGENCY DEPARTMENT Provider Note   CSN: [id number] Arrival date & time: 07/23/19  1333     History Chief Complaint  Patient presents with  . Shortness of Breath    [name] is a 74 y.o. male with h/o myelodysplasia/myeloproliferative disorder, CAD, ESRD on HD MWF, HTN, chronic back and abdominal pain presents to ER for evaluation of "feeling bad". Onset 1 week ago.  Difficult historian and not very forthcoming states he just feels bad all the time.   On review of systems he reports congestion, rhinorrhea, sore throat, cough with sputum, shortness of breath, generalized weakness and abdominal pain.  No sick contacts. No travel. Lives with wife who is asymptomatic.  Goes out into community without a mask.  Still produces urine and denies dysuria. No vomiting or diarrhea. No chest pain. Last HD Wednesday, felt too poorly to go on Friday.   HPI     Past Medical History:  Diagnosis Date  . Anxiety   . Arthritis   . BPH (benign prostatic hyperplasia)   . CAD (coronary artery disease)    DES to circumflex 07/2016, moderate residual LAD and RCA, small 90% OM3 - managed medically  . CHF (congestive heart failure) ([place])   . Chronic lower back pain   . Depression   . ESRD (end stage renal disease) ([place])    Hemo MWF [facility]  . Essential hypertension   . Gastritis   . GERD (gastroesophageal reflux disease)   . History of kidney stones   . Leukemia ([place])   . Leukocytosis   . 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 Pleural effusion    had Thorancentesis at [facility]  . Pneumonia    last time 08/2018- Aspiration Pneumonia - unintentional opiate overdose  . Renal insufficiency   . Spleen enlarged   . Thrombocythemia ([place])   . Type II diabetes mellitus [facility])     Patient Active Problem List   Diagnosis Date Noted  . Intractable pain 04/19/2019  . Elevated d-dimer 04/19/2019   . RUQ abdominal pain 11/18/2017  . Acute CHF (congestive heart failure) ([place]) 09/15/2016  . Essential hypertension 08/14/2016  . Headache 08/14/2016  . Hyperlipidemia LDL goal <70 08/14/2016  . Abnormal nuclear stress test   . 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 Acute respiratory failure with hypoxia ([place]) 04/27/2016  . Hypoxia   . Hematoma 04/25/2016  . Mass of chest wall, left   . Abnormal partial thromboplastin time (PTT)   . 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 Coronary artery disease due to lipid rich plaque 04/15/2016  . Leukocytosis 04/15/2016  . Diabetes mellitus ([place]) 04/15/2016  . Pleuritic pain 04/15/2016  . ESRD on hemodialysis ([place]) 04/15/2016  . Left shoulder pain 04/15/2016  . History of adenomatous polyp of colon 01/21/2013  . GERD (gastroesophageal reflux disease) 01/21/2013    Past Surgical History:  Procedure Laterality Date  . AV FISTULA PLACEMENT Left 04/14/2019   Procedure: ARTERIOVENOUS (AV) FISTULA CREATION LEFT ARM;  Surgeon: Rosetta Posner, MD;  Location: [place];  Service: Vascular;  Laterality: Left;  . BIOPSY  12/08/2017   Procedure: BIOPSY;  Surgeon: Danie Binder, MD;  Location: [facility] ENDO [facility];  Service: Endoscopy;;  gastric  . BONE MARROW BIOPSY     x 3 times  . CARDIAC CATHETERIZATION N/A 08/12/2016   Procedure: Left Heart Cath and Coronary Angiography;  Surgeon: Conception Oms  Hassell Done, MD;  Location: [place] CV LAB;  Service: Cardiovascular;  Laterality: N/A;  . CARDIAC CATHETERIZATION N/A 08/12/2016   Procedure: Coronary Stent Intervention;  Surgeon: Jettie Booze, MD;  Location: [place] CV LAB;  Service: Cardiovascular;  Laterality: N/A;  . CARDIAC CATHETERIZATION  2000s X 1  . COLONOSCOPY WITH PROPOFOL N/A 02/01/2013   SLF: 1. 23 colon polyps removed. 2 retrieved. 2. Mild diverticulosis in teh sigmoid colon 3. Small internal hemorrhoids 4. The colon is redundant   . COLONOSCOPY WITH PROPOFOL N/A 12/08/2017   Procedure:  COLONOSCOPY WITH PROPOFOL;  Surgeon: Danie Binder, MD;  Location[facility];  Service: Endoscopy;  Laterality: N/A;  1:45pm  . CYSTOSCOPY W/ STONE MANIPULATION    . ESOPHAGOGASTRODUODENOSCOPY (EGD) WITH PROPOFOL N/A 02/01/2013   SLF: 1. Moderate erosive gastritis  . ESOPHAGOGASTRODUODENOSCOPY (EGD) WITH PROPOFOL N/A 12/08/2017   Procedure: ESOPHAGOGASTRODUODENOSCOPY (EGD) WITH PROPOFOL;  Surgeon: Danie Binder, MD;  Location[facility];  Service: Endoscopy;  Laterality: N/A;  . LUMBAR [place]  . POLYPECTOMY N/A 02/01/2013   Procedure: POLYPECTOMY;  Surgeon: Danie Binder, MD;  Location: [facility] ORS;  Service: Endoscopy;  Laterality: N/A;       Family History  Problem Relation Age of Onset  . Heart attack Mother   . CVA Mother   . Heart attack Father   . Colon cancer Neg Hx     Social History   Tobacco Use  . Smoking status: Former Smoker    Packs/day: 1.00    Years: 20.00    Pack years: 20.00    Types: Cigars    Start date: 06/25/1975    Quit date: 08/04/2013    Years since quitting: 5.9  . Smokeless tobacco: Never Used  Substance Use Topics  . Alcohol use: Not Currently    Comment: Remote history of ETOH abuse, "when I was young and dumb"   . Drug use: No    Home Medications Prior to Admission medications   Medication Sig Start Date End Date Taking? Authorizing Provider  allopurinol (ZYLOPRIM) 100 MG tablet Take 200 mg by mouth every morning.   Yes [provider]  calcium acetate (PHOSLO) 667 MG capsule Take 667 mg by mouth 3 (three) times daily with meals.   Yes [provider]  DULoxetine (CYMBALTA) 60 MG capsule Take 1 capsule (60 mg total) by mouth daily. 04/21/19  Yes Emokpae, Courage, MD  furosemide (LASIX) 40 MG tablet Take 40 mg by mouth 2 (two) times daily. 06/17/19  Yes [provider]  hydrALAZINE (APRESOLINE) 50 MG tablet Take 2 tablets (100 mg total) by mouth 3 (three) times daily. Patient taking differently: Take  50 mg by mouth 3 (three) times daily.  10/20/18  Yes Branch, Alphonse Guild, MD  HYDROcodone-acetaminophen (NORCO/VICODIN) 5-325 MG tablet Take 1 tablet by mouth 3 (three) times daily. 06/30/19  Yes [provider]  metoprolol tartrate (LOPRESSOR) 25 MG tablet Take 1 tablet (25 mg total) by mouth 2 (two) times daily. 04/21/19  Yes Emokpae, Courage, MD  nitroGLYCERIN (NITROSTAT) 0.4 MG SL tablet Place 1 tablet (0.4 mg total) under the tongue every 5 (five) minutes x 3 doses as needed for chest pain (if no relief after 3rd dose, proceed to the ED for an evaluation or call 911). 10/04/18  Yes BranchAlphonse Guild, MD  pravastatin (PRAVACHOL) 40 MG tablet Take 40 mg by mouth daily. 07/01/19  Yes [provider]  ruxolitinib phosphate (JAKAFI)  5 MG tablet Take 5 mg by mouth daily.   Yes [provider]  traZODone (DESYREL) 50 MG tablet Take 1 tablet (50 mg total) by mouth at bedtime as needed for sleep. 04/21/19  Yes Roxan Hockey, MD  acetaminophen (TYLENOL) 500 MG tablet Take 1,000 mg by mouth 2 (two) times daily as needed for mild pain or moderate pain.     [provider]  aspirin EC 81 MG EC tablet Take 1 tablet (81 mg total) by mouth daily with breakfast. 04/21/19   Emokpae, Courage, MD  gabapentin (NEURONTIN) 100 MG capsule Take 1 capsule (100 mg total) by mouth at bedtime. 04/21/19   Roxan Hockey, MD  hydrOXYzine (ATARAX/VISTARIL) 25 MG tablet Take 1 tablet (25 mg total) by mouth every 8 (eight) hours as needed for anxiety, itching or nausea. 04/21/19   Roxan Hockey, MD  methocarbamol (ROBAXIN) 750 MG tablet Take 1 tablet (750 mg total) by mouth 3 (three) times daily. For muscle pain/spasm 04/21/19   Roxan Hockey, MD  oxyCODONE-acetaminophen (PERCOCET/ROXICET) 5-325 MG tablet Take 1 tablet by mouth every 4 (four) hours as needed. 07/03/19   Evalee Jefferson, PA-C    Allergies    Hydrocodone  Review of Systems   Review of Systems  Constitutional: Positive for  fatigue.  HENT: Positive for congestion, rhinorrhea and sore throat.   Respiratory: Positive for cough and shortness of breath.   Gastrointestinal: Positive for abdominal pain (chronic).  All other systems reviewed and are negative.   Physical Exam Updated Vital Signs BP (!) 158/94   Pulse 91   Temp 98.4 F (36.9 C) (Oral)   Resp (!) 35   Ht 6' (1.829 m)   Wt 88.5 kg   SpO2 90%   BMI 26.45 kg/m   Physical Exam Vitals and nursing note reviewed.  Constitutional:      Appearance: He is well-developed.     Comments: No distress, appears and sounds tired. mentating well.   HENT:     Head: Normocephalic and atraumatic.     Nose: Nose normal.     Mouth/Throat:     Comments: Mild erythema oropharynx and tonsils, otherwise normal  Eyes:     Conjunctiva/sclera: Conjunctivae normal.  Cardiovascular:     Rate and Rhythm: Normal rate and regular rhythm.     Heart sounds: Normal heart sounds.  Pulmonary:     Effort: Pulmonary effort is normal.     Comments: SpO2 90% on RA at rest during exam, tachypnic. Speaking in full sentences. No wheezing, crackles. Diminished BS in lower lobes.  Abdominal:     General: Bowel sounds are normal.     Palpations: Abdomen is soft.     Tenderness: There is abdominal tenderness (generalized).     Comments: Generalized tenderness. Bilateral CVAT with percussion and palpation. No suprapubic tenderness.   Musculoskeletal:        General: Normal range of motion.     Cervical back: Normal range of motion.  Skin:    General: Skin is warm and dry.     Capillary Refill: Capillary refill takes less than 2 seconds.  Neurological:     Mental Status: He is alert.  Psychiatric:        Behavior: Behavior normal.     ED Results / Procedures / Treatments   Labs (all labs ordered are listed, but only abnormal results are displayed) Labs Reviewed  CBC WITH DIFFERENTIAL/PLATELET - Abnormal; Notable for the following components:      Result  Value   RBC 3.54  (*)    Hemoglobin 11.0 (*)    HCT 33.8 (*)    RDW 19.1 (*)    Platelets 129 (*)    nRBC 5.3 (*)    Basophils Absolute 0.3 (*)    Abs Immature Granulocytes 2.11 (*)    All other components within normal limits  COMPREHENSIVE METABOLIC PANEL - Abnormal; Notable for the following components:   BUN 34 (*)    Creatinine, Ser 5.52 (*)    Calcium 7.9 (*)    Total Protein 5.6 (*)    Albumin 2.6 (*)    AST 47 (*)    Alkaline Phosphatase 187 (*)    GFR calc non Af Amer 9 (*)    GFR calc Af Amer 11 (*)    All other components within normal limits  URINALYSIS, ROUTINE W REFLEX MICROSCOPIC - Abnormal; Notable for the following components:   Color, Urine AMBER (*)    APPearance HAZY (*)    Specific Gravity, Urine 1.033 (*)    Glucose, UA >=500 (*)    Ketones, ur 20 (*)    Protein, ur >=300 (*)    All other components within normal limits  POC SARS CORONAVIRUS 2 AG -  ED - Abnormal; Notable for the following components:   SARS Coronavirus 2 Ag POSITIVE (*)    All other components within normal limits  URINE CULTURE  MAGNESIUM  D-DIMER, QUANTITATIVE (NOT AT [facility])  C-REACTIVE PROTEIN  LACTATE DEHYDROGENASE  PROCALCITONIN  PROCALCITONIN  TROPONIN I (HIGH SENSITIVITY)    EKG EKG Interpretation  Date/Time:  Saturday July 23 2019 18:00:48 EST Ventricular Rate:  97 PR Interval:  168 QRS Duration: 93 QT Interval:  398 QTC Calculation: 506 R Axis:   29 Text Interpretation: Sinus rhythm Atrial premature complexes Probable LVH with secondary repol abnrm Prolonged QT interval No acute changes No significant change since last tracing inferior and lateral waves ST depression Confirmed by Varney Biles ([id number]) on 07/23/2019 6:17:23 PM   Radiology DG Chest Portable 1 View  Result Date: 07/23/2019 CLINICAL DATA:  Shortness of breath.  Cough. EXAM: PORTABLE CHEST 1 VIEW COMPARISON:  05/11/2019; 04/19/2019 FINDINGS: Grossly unchanged enlarged cardiac silhouette and mediastinal contours with  atherosclerotic plaque within the thoracic aorta. Stable positioning of support apparatus. Interval development of bilateral ill-defined slightly nodular heterogeneous airspace opacities, right greater than left. No pleural effusion or pneumothorax. No evidence of edema. No acute osseous abnormalities. IMPRESSION: Ill-defined bilateral heterogeneous slightly nodular airspace opacities, nonspecific though worrisome for atypical infection such as COVID-19. Clinical correlation is advised. Electronically Signed   By: Sandi Mariscal M.D.   On: 07/23/2019 14:41    Procedures .Critical Care Performed by: Kinnie Feil, PA-C Authorized by: Kinnie Feil, PA-C   Critical care provider statement:    Critical care time (minutes):  45   Critical care was necessary to treat or prevent imminent or life-threatening deterioration of the following conditions:  Respiratory failure   Critical care was time spent personally by me on the following activities:  Discussions with consultants, evaluation of patient's response to treatment, examination of patient, ordering and performing treatments and interventions, ordering and review of laboratory studies, ordering and review of radiographic studies, pulse oximetry, re-evaluation of patient's condition, obtaining history from patient or surrogate, review of old charts and development of treatment plan with patient or surrogate   I assumed direction of critical care for this patient from another provider in my specialty: no     (  including critical care time)  Medications Ordered in ED Medications  remdesivir 200 mg in sodium chloride 0.9% 250 mL IVPB (has no administration in time range)    Followed by  remdesivir 100 mg in sodium chloride 0.9 % 100 mL IVPB (has no administration in time range)  dexamethasone (DECADRON) injection 10 mg (10 mg Intravenous Given 07/23/19 1720)    ED Course  I have reviewed the triage vital signs and the nursing notes.   Pertinent labs & imaging results that were available during my care of the patient were reviewed by me and considered in my medical decision making (see chart for details).  Clinical Course as of Jul 23 1823  Sat Jul 23, 2019  1657 SpO2: 92 % [CG]  1658 Resp(!): 22 [CG]  1658 BP(!): 174/84 [CG]  1658 Pulse Rate: 98 [CG]  1658 Temp: 98.4 F (36.9 C) [CG]  1658 WBC: 7.5 [CG]  1658 Baseline   Hemoglobin(!): 11.0 [CG]  1658 H/o of thrombocytopenia   Platelets(!): 129 [CG]  1658 Slightly above baseline  Creatinine(!): 5.52 [CG]  1658 Potassium: 4.0 [CG]  1658 Alkaline Phosphatase(!): 187 [CG]  1658 GFR, Est Non African American(!): 9 [CG]  1658 SARS Coronavirus 2 Ag(!): POSITIVE [CG]  1658 IMPRESSION: Ill-defined bilateral heterogeneous slightly nodular airspace opacities, nonspecific though worrisome for atypical infection such as COVID-19. Clinical correlation is advised.   DG Chest Portable 1 View [CG]  1730 Normal sinus rhythm ST & T wave abnormality, consider anterior ischemia Abnormal ECG wellen's sign type biphasic ST depression - mild in inferior leads Confirmed by Varney Biles [phone]) on 07/23/2019 5:20:41 PM  EKG 12-Lead [CG]  1822 EKG 12-Lead [CG]  1822 Sinus rhythm Atrial premature complexes Probable LVH with secondary repol abnrm Prolonged QT interval No acute changes No significant change since last tracing inferior and lateral waves ST depression Confirmed by Varney Biles ([id number]) on 07/23/2019 6:17:23 PM   [CG]    Clinical Course User Index [CG] Kinnie Feil, PA-C   MDM Rules/Calculators/A&P                      EMR reviewed  Last echo with LVH, possible diastolic dysfunction, EF 11-91%.   Symptoms highly suspicious for infectious process specially viral given broad systems involved. Patient does not wear a mask out in the community  Labs, CXR, EKG and covid ordered.  Borderline hypoxic 90% at rest during my exam, will ambulate.  ER work up  personally reviewed confirms COVID illness with test and on CXR findings.  Labs otherwise essentially at baseline with h/o hematologic disorder, ESRD.   EKG shows biphasic T waves and ST depression in I-III. Pt has no CP but concerning for wellen's.  Will repeat EKG and add trops. EKG reviewed with EDP.   No signs of SIRS, HD stable but dropped to 86% with walk test now on 3 L [place].   Pt re-evaluated, no clinical decline and stable on [place].   Will admit for hypoxia due to COVID illness vs missed dialysis although creatinine only slightly elevated, and normal K and no significant edema on CXR. Will need dialysis during admission.  He has no CP. No signs of hypervolemia. ACS, decomp HF and PE unlikely.    1822: Hospitalist attempted to admit pt but he refused. I spoke to patient and he has changed his mind.  Will repage Dr Nehemiah Settle.  EKG repeated and initial questinoable biphasic T wave resolved, EKG unchanged with subtle ST  depression inferior/lateral leads.   Final Clinical Impression(s) / ED Diagnoses Final diagnoses:  COVID-19 virus infection  Acute respiratory failure due to COVID-19 [facility])    Rx / DC Orders ED Discharge Orders    None       Kinnie Feil, PA-C 07/23/19 Milon Score, MD 07/25/19 [phone]

## 2019-07-23 NOTE — ED Notes (Addendum)
Pt refused Remdesivir. This RN tried to explain to pt about the benefits of the medication, pt still stated "I don't want that virus medicine and I just want to leave." MD aware.

## 2019-07-23 NOTE — H&P (Signed)
History and Physical  [name] IFO:[id number] DOB: 1946/04/29 DOA: 07/23/2019  Referring physician: Carmon Sails, Hershal Coria, ED provider PCP: [name], MD  Outpatient Specialists:   Patient Coming From: home  Chief Complaint: SOB  HPI: [name] is a 74 y.o. 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  Patient regularly goes on the community without wearing masks and is coming in with shortness of breath over the past several days.  Has been having a cough.  Shortness of breath worse with ambulation and improved with rest, although he is still quite tachypneic and short of breath with even the simplest conversation at rest.  He does get dialysis Monday Wednesday Friday, although he did not get dialysis yesterday due to feeling so ill.  Presented to the hospital for evaluation.  Emergency Department Course: Pulse oximetry 86% on room air.  Covid +0.5, CRP 11.3, high-sensitivity troponin 38, procalcitonin 1.73, LDH 1162.  Review of Systems:   Pt denies any fevers, chills, nausea, vomiting, diarrhea, constipation, abdominal pain, wheezing, palpitations, headache, vision changes, lightheadedness, dizziness, melena, rectal bleeding.  Review of systems are otherwise negative  Past Medical History:  Diagnosis Date  . Anxiety   . Arthritis   . BPH (benign prostatic hyperplasia)   . CAD (coronary artery disease)    DES to circumflex 07/2016, moderate residual LAD and RCA, small 90% OM3 - managed medically  . CHF (congestive heart failure) ([place])   . Chronic lower back pain   . Depression   . ESRD (end stage renal disease) ([place])    Hemo MWF [facility]  . Essential hypertension   . Gastritis   . GERD (gastroesophageal reflux disease)   . History of kidney stones   . Leukemia ([place])   . Leukocytosis   .  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[place])   . 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[place])    Likely MDS/MPN, unclassifiable - folowed at [facility]  . Pleural effusion    had Thorancentesis at [facility]  . Pneumonia    last time 08/2018- Aspiration Pneumonia - unintentional opiate overdose  . Renal insufficiency   . Spleen enlarged   . Thrombocythemia ([place])   . Type II diabetes mellitus ([place])    Past Surgical History:  Procedure Laterality Date  . AV FISTULA PLACEMENT Left 04/14/2019   Procedure: ARTERIOVENOUS (AV) FISTULA CREATION LEFT ARM;  Surgeon: Rosetta Posner, MD;  Location: [place];  Service: Vascular;  Laterality: Left;  . BIOPSY  12/08/2017   Procedure: BIOPSY;  Surgeon: Danie Binder, MD;  [facility];  Service: Endoscopy;;  gastric  . BONE MARROW BIOPSY     x 3 times  . CARDIAC CATHETERIZATION N/A 08/12/2016   Procedure: Left Heart Cath and Coronary Angiography;  Surgeon: Jettie Booze, MD;  Location: [place] CV LAB;  Service: Cardiovascular;  Laterality: N/A;  . CARDIAC CATHETERIZATION N/A 08/12/2016   Procedure: Coronary Stent Intervention;  Surgeon: Jettie Booze, MD;  Location: [place] CV LAB;  Service: Cardiovascular;  Laterality: N/A;  . CARDIAC CATHETERIZATION  2000s X 1  . COLONOSCOPY WITH PROPOFOL N/A 02/01/2013   SLF: 1. 23 colon polyps removed. 2 retrieved. 2. Mild diverticulosis in teh sigmoid colon 3. Small internal hemorrhoids 4. The colon is redundant   . COLONOSCOPY WITH PROPOFOL N/A 12/08/2017   Procedure: COLONOSCOPY WITH PROPOFOL;  Surgeon: Danie Binder,  MD;  [facility];  Service: Endoscopy;  Laterality: N/A;  1:45pm  . CYSTOSCOPY W/ STONE MANIPULATION    . ESOPHAGOGASTRODUODENOSCOPY (EGD) WITH PROPOFOL N/A 02/01/2013   SLF: 1. Moderate erosive gastritis  . ESOPHAGOGASTRODUODENOSCOPY (EGD) WITH PROPOFOL N/A 12/08/2017   Procedure: ESOPHAGOGASTRODUODENOSCOPY (EGD) WITH PROPOFOL;  Surgeon: Danie Binder, MD;  [facility];  Service:  Endoscopy;  Laterality: N/A;  . LUMBAR [place]  . POLYPECTOMY N/A 02/01/2013   Procedure: POLYPECTOMY;  Surgeon: Danie Binder, MD;  Location: [facility];  Service: Endoscopy;  Laterality: N/A;   Social History:  reports that he quit smoking about 5 years ago. His smoking use included cigars. He started smoking about 44 years ago. He has a 20.00 pack-year smoking history. He has never used smokeless tobacco. He reports previous alcohol use. He reports that he does not use drugs. Patient lives at home  Allergies  Allergen Reactions  . Hydrocodone Itching    Family History  Problem Relation Age of Onset  . Heart attack Mother   . CVA Mother   . Heart attack Father   . Colon cancer Neg Hx       Prior to Admission medications   Medication Sig Start Date End Date Taking? Authorizing Provider  allopurinol (ZYLOPRIM) 100 MG tablet Take 200 mg by mouth every morning.   Yes [provider]  calcium acetate (PHOSLO) 667 MG capsule Take 667 mg by mouth 3 (three) times daily with meals.   Yes [provider]  DULoxetine (CYMBALTA) 60 MG capsule Take 1 capsule (60 mg total) by mouth daily. 04/21/19  Yes Emokpae, Courage, MD  furosemide (LASIX) 40 MG tablet Take 40 mg by mouth 2 (two) times daily. 06/17/19  Yes [provider]  hydrALAZINE (APRESOLINE) 50 MG tablet Take 2 tablets (100 mg total) by mouth 3 (three) times daily. Patient taking differently: Take 50 mg by mouth 3 (three) times daily.  10/20/18  Yes Branch, Alphonse Guild, MD  HYDROcodone-acetaminophen (NORCO/VICODIN) 5-325 MG tablet Take 1 tablet by mouth 3 (three) times daily. 06/30/19  Yes [provider]  metoprolol tartrate (LOPRESSOR) 25 MG tablet Take 1 tablet (25 mg total) by mouth 2 (two) times daily. 04/21/19  Yes Emokpae, Courage, MD  nitroGLYCERIN (NITROSTAT) 0.4 MG SL tablet Place 1 tablet (0.4 mg total) under the tongue every 5 (five) minutes x 3 doses as needed for chest pain (if no relief  after 3rd dose, proceed to the ED for an evaluation or call 911). 10/04/18  Yes BranchAlphonse Guild, MD  pravastatin (PRAVACHOL) 40 MG tablet Take 40 mg by mouth daily. 07/01/19  Yes [provider]  ruxolitinib phosphate (JAKAFI) 5 MG tablet Take 5 mg by mouth daily.   Yes [provider]  traZODone (DESYREL) 50 MG tablet Take 1 tablet (50 mg total) by mouth at bedtime as needed for sleep. 04/21/19  Yes Roxan Hockey, MD  acetaminophen (TYLENOL) 500 MG tablet Take 1,000 mg by mouth 2 (two) times daily as needed for mild pain or moderate pain.     [provider]  aspirin EC 81 MG EC tablet Take 1 tablet (81 mg total) by mouth daily with breakfast. 04/21/19   Emokpae, Courage, MD  gabapentin (NEURONTIN) 100 MG capsule Take 1 capsule (100 mg total) by mouth at bedtime. 04/21/19   Roxan Hockey, MD  hydrOXYzine (ATARAX/VISTARIL) 25 MG tablet Take 1 tablet (25 mg total) by mouth every 8 (eight) hours  as needed for anxiety, itching or nausea. 04/21/19   Roxan Hockey, MD  methocarbamol (ROBAXIN) 750 MG tablet Take 1 tablet (750 mg total) by mouth 3 (three) times daily. For muscle pain/spasm 04/21/19   Roxan Hockey, MD  oxyCODONE-acetaminophen (PERCOCET/ROXICET) 5-325 MG tablet Take 1 tablet by mouth every 4 (four) hours as needed. 07/03/19   Evalee Jefferson, PA-C    Physical Exam: BP (!) 169/94   Pulse 96   Temp 98.4 F (36.9 C) (Oral)   Resp (!) 26   Ht 6' (1.829 m)   Wt 88.5 kg   SpO2 100%   BMI 26.45 kg/m   . General: Elderly male. Awake and alert and oriented x3. No acute cardiopulmonary distress.  Marland Kitchen HEENT: Normocephalic atraumatic.  Right and left ears normal in appearance.  Pupils equal, round, reactive to light. Extraocular muscles are intact. Sclerae anicteric and noninjected.  Moist mucosal membranes. No mucosal lesions.  . Neck: Neck supple without lymphadenopathy. No carotid bruits. No masses palpated.  . Cardiovascular: Regular rate with normal S1-S2  sounds. No murmurs, rubs, gallops auscultated. No JVD.  Marland Kitchen Respiratory: Diffuse rales.  Diminished breath sounds.  Poor inspiratory effort.  Tachypneic with conversation.  No accessory muscle use. . Abdomen: Soft, nontender, nondistended. Active bowel sounds. No masses or hepatosplenomegaly  . Skin: No rashes, lesions, or ulcerations.  Dry, warm to touch. 2+ dorsalis pedis and radial pulses. . Musculoskeletal: No calf or leg pain. All major joints not erythematous nontender.  No upper or lower joint deformation.  Good ROM.  No contractures  . Psychiatric: Intact judgment and insight. Pleasant and cooperative. . Neurologic: No focal neurological deficits. Strength is 5/5 and symmetric in upper and lower extremities.  Cranial nerves II through XII are grossly intact.           Labs on Admission: I have personally reviewed following labs and imaging studies  CBC: Recent Labs  Lab 07/23/19 1506  WBC 7.5  NEUTROABS 2.8  HGB 11.0*  HCT 33.8*  MCV 95.5  PLT 914*   Basic Metabolic Panel: Recent Labs  Lab 07/23/19 1506  NA 135  K 4.0  CL 99  CO2 24  GLUCOSE 95  BUN 34*  CREATININE 5.52*  CALCIUM 7.9*  MG 1.7   GFR: Estimated Creatinine Clearance: 12.9 mL/min (A) (by C-G formula based on SCr of 5.52 mg/dL (H)). Liver Function Tests: Recent Labs  Lab 07/23/19 1506  AST 47*  ALT 28  ALKPHOS 187*  BILITOT 1.0  PROT 5.6*  ALBUMIN 2.6*   No results for input(s): LIPASE, AMYLASE in the last 168 hours. No results for input(s): AMMONIA in the last 168 hours. Coagulation Profile: No results for input(s): INR, PROTIME in the last 168 hours. Cardiac Enzymes: No results for input(s): CKTOTAL, CKMB, CKMBINDEX, TROPONINI in the last 168 hours. BNP (last 3 results) No results for input(s): PROBNP in the last 8760 hours. HbA1C: No results for input(s): HGBA1C in the last 72 hours. CBG: No results for input(s): GLUCAP in the last 168 hours. Lipid Profile: No results for input(s):  CHOL, HDL, LDLCALC, TRIG, CHOLHDL, LDLDIRECT in the last 72 hours. Thyroid Function Tests: No results for input(s): TSH, T4TOTAL, FREET4, T3FREE, THYROIDAB in the last 72 hours. Anemia Panel: No results for input(s): VITAMINB12, FOLATE, FERRITIN, TIBC, IRON, RETICCTPCT in the last 72 hours. Urine analysis:    Component Value Date/Time   COLORURINE AMBER (A) 07/23/2019 1730   APPEARANCEUR HAZY (A) 07/23/2019 1730   LABSPEC 1.033 (  H) 07/23/2019 1730   PHURINE 7.0 07/23/2019 1730   GLUCOSEU >=500 (A) 07/23/2019 1730   HGBUR NEGATIVE 07/23/2019 1730   BILIRUBINUR NEGATIVE 07/23/2019 1730   KETONESUR 20 (A) 07/23/2019 1730   PROTEINUR >=300 (A) 07/23/2019 1730   UROBILINOGEN 0.2 03/26/2008 1920   NITRITE NEGATIVE 07/23/2019 1730   LEUKOCYTESUR NEGATIVE 07/23/2019 1730   Sepsis Labs: '@LABRCNTIP'$ (procalcitonin:4,lacticidven:4) )No results found for this or any previous visit (from the past 240 hour(s)).   Radiological Exams on Admission: DG Chest Portable 1 View  Result Date: 07/23/2019 CLINICAL DATA:  Shortness of breath.  Cough. EXAM: PORTABLE CHEST 1 VIEW COMPARISON:  05/11/2019; 04/19/2019 FINDINGS: Grossly unchanged enlarged cardiac silhouette and mediastinal contours with atherosclerotic plaque within the thoracic aorta. Stable positioning of support apparatus. Interval development of bilateral ill-defined slightly nodular heterogeneous airspace opacities, right greater than left. No pleural effusion or pneumothorax. No evidence of edema. No acute osseous abnormalities. IMPRESSION: Ill-defined bilateral heterogeneous slightly nodular airspace opacities, nonspecific though worrisome for atypical infection such as COVID-19. Clinical correlation is advised. Electronically Signed   By: Sandi Mariscal M.D.   On: 07/23/2019 14:41    EKG: Independently reviewed.  Sinus rhythm with premature atrial complexes.  LVH.  No acute ST changes.    Assessment/Plan: Active Problems:   GERD  (gastroesophageal reflux disease)   Coronary artery disease due to lipid rich plaque   Diabetes mellitus ([place])   ESRD on hemodialysis ([place])   Acute respiratory failure with hypoxia (HCC)   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[place])   Essential hypertension   Acute respiratory disease due to COVID-19 virus    This patient was discussed with the ED physician, including pertinent vitals, physical exam findings, labs, and imaging.  We also discussed care given by the ED provider.  Acute respiratory failure with hypoxia secondary to COVID-19 respiratory disease  Admit  Supplemental oxygen as needed  Daily CBC, CMP, procalcitonin, CRP, LDH, D-dimer  Start remdesivir  Decadron  Hold Jakafi  Diabetes  CBGs, sliding scale, low-dose long-acting insulin  End-stage renal disease on dialysis  Consult nephrology for dialysis  Coronary artery disease with hypertension  Continue daily aspirin  Lopressor, a Apresoline  Myeloproliferative disease  Hold Jakafi  GERD  Continue PPI  DVT prophylaxis: heparin Consultants: Nephrology for dialysis Code Status: Full code Family Communication: None Disposition Plan: Pending   Truett Mainland, DO

## 2019-07-23 NOTE — ED Notes (Signed)
Pt has given verbal consent to give information to daughter, Marta Lamas, at [phone]. Opal Sidles requests that we do not give the friend listed in his chart any information because it is his ex-wife and she doesn't feel that she will "handle it well". She asked for all information to go through her and pt gave consent to talk to her.

## 2019-07-23 NOTE — ED Notes (Addendum)
Patients o2 dropped to 86 almost immediatly while ambulating, reports SOB, back up to 94 once in bed for 3 mins.

## 2019-07-23 NOTE — ED Triage Notes (Signed)
States he has felt bad all week and has not been to dialysis since Monday. Aching all over, sob, and weak with a cough.

## 2019-07-24 ENCOUNTER — Encounter (HOSPITAL_COMMUNITY): Payer: Self-pay | Admitting: Family Medicine

## 2019-07-24 DIAGNOSIS — J069 Acute upper respiratory infection, unspecified: Secondary | ICD-10-CM

## 2019-07-24 LAB — GLUCOSE, CAPILLARY
Glucose-Capillary: 194 mg/dL — ABNORMAL HIGH (ref 70–99)
Glucose-Capillary: 162 mg/dL — ABNORMAL HIGH (ref 70–99)
Glucose-Capillary: 207 mg/dL — ABNORMAL HIGH (ref 70–99)
Glucose-Capillary: 138 mg/dL — ABNORMAL HIGH (ref 70–99)
Glucose-Capillary: 109 mg/dL — ABNORMAL HIGH (ref 70–99)

## 2019-07-24 LAB — COMPREHENSIVE METABOLIC PANEL
ALT: 27 U/L (ref 0–44)
AST: 42 U/L — ABNORMAL HIGH (ref 15–41)
Albumin: 2.6 g/dL — ABNORMAL LOW (ref 3.5–5.0)
Alkaline Phosphatase: 179 U/L — ABNORMAL HIGH (ref 38–126)
Anion gap: 16 — ABNORMAL HIGH (ref 5–15)
BUN: 51 mg/dL — ABNORMAL HIGH (ref 8–23)
CO2: 19 mmol/L — ABNORMAL LOW (ref 22–32)
Calcium: 7.9 mg/dL — ABNORMAL LOW (ref 8.9–10.3)
Chloride: 98 mmol/L (ref 98–111)
Creatinine, Ser: 5.84 mg/dL — ABNORMAL HIGH (ref 0.61–1.24)
GFR calc Af Amer: 10 mL/min — ABNORMAL LOW (ref >60)
GFR calc non Af Amer: 9 mL/min — ABNORMAL LOW (ref >60)
Glucose, Bld: 178 mg/dL — ABNORMAL HIGH (ref 70–99)
Potassium: 4.1 mmol/L (ref 3.5–5.1)
Sodium: 133 mmol/L — ABNORMAL LOW (ref 135–145)
Total Bilirubin: 0.8 mg/dL (ref 0.3–1.2)
Total Protein: 5.7 g/dL — ABNORMAL LOW (ref 6.5–8.1)

## 2019-07-24 LAB — CBC WITH DIFFERENTIAL/PLATELET
Abs Immature Granulocytes: 2.81 10*3/uL — ABNORMAL HIGH (ref 0.00–0.07)
Basophils Absolute: 0.6 10*3/uL — ABNORMAL HIGH (ref 0.0–0.1)
Basophils Relative: 5 %
Eosinophils Absolute: 0 10*3/uL (ref 0.0–0.5)
Eosinophils Relative: 0 %
HCT: 36.8 % — ABNORMAL LOW (ref 39.0–52.0)
Hemoglobin: 12.1 g/dL — ABNORMAL LOW (ref 13.0–17.0)
Immature Granulocytes: 25 %
Lymphocytes Relative: 24 %
Lymphs Abs: 2.8 10*3/uL (ref 0.7–4.0)
MCH: 31.3 pg (ref 26.0–34.0)
MCHC: 32.9 g/dL (ref 30.0–36.0)
MCV: 95.1 fL (ref 80.0–100.0)
Monocytes Absolute: 1.1 10*3/uL — ABNORMAL HIGH (ref 0.1–1.0)
Monocytes Relative: 10 %
Neutro Abs: 4.1 10*3/uL (ref 1.7–7.7)
Neutrophils Relative %: 36 %
Platelets: 141 10*3/uL — ABNORMAL LOW (ref 150–400)
RBC: 3.87 MIL/uL — ABNORMAL LOW (ref 4.22–5.81)
RDW: 19.1 % — ABNORMAL HIGH (ref 11.5–15.5)
WBC: 11.4 10*3/uL — ABNORMAL HIGH (ref 4.0–10.5)
nRBC: 1.3 % — ABNORMAL HIGH (ref 0.0–0.2)

## 2019-07-24 LAB — ABO/RH
ABO/RH(D): O NEG
ABO/RH(D): O NEG

## 2019-07-24 LAB — PROCALCITONIN: Procalcitonin: 1.21 ng/mL

## 2019-07-24 LAB — FERRITIN: Ferritin: 412 ng/mL — ABNORMAL HIGH (ref 24–336)

## 2019-07-24 LAB — D-DIMER, QUANTITATIVE: D-Dimer, Quant: 1.52 ug/mL-FEU — ABNORMAL HIGH (ref 0.00–0.50)

## 2019-07-24 LAB — MAGNESIUM: Magnesium: 1.9 mg/dL (ref 1.7–2.4)

## 2019-07-24 LAB — PHOSPHORUS: Phosphorus: 7 mg/dL — ABNORMAL HIGH (ref 2.5–4.6)

## 2019-07-24 LAB — MRSA PCR SCREENING: MRSA by PCR: POSITIVE — AB

## 2019-07-24 LAB — C-REACTIVE PROTEIN: CRP: 15 mg/dL — ABNORMAL HIGH (ref <1.0)

## 2019-07-24 MED ORDER — CHLORHEXIDINE GLUCONATE CLOTH 2 % EX PADS: 6.0000 | MEDICATED_PAD | Freq: Every day | CUTANEOUS | Status: DC

## 2019-07-24 MED ORDER — SODIUM CHLORIDE 0.9 % IV SOLN: 100.0000 mL | INTRAVENOUS | Status: DC | PRN

## 2019-07-24 MED ORDER — HEPARIN SODIUM (PORCINE) 1000 UNIT/ML DIALYSIS: 2000.0000 [IU] | INTRAMUSCULAR | Status: DC | PRN

## 2019-07-24 MED ORDER — MUPIROCIN 2 % EX OINT
1.0000 "application " | TOPICAL_OINTMENT | Freq: Two times a day (BID) | CUTANEOUS | Status: DC
  Administered 2019-07-25: 1 via NASAL
  Filled 2019-07-24: qty 22

## 2019-07-24 MED ORDER — LIDOCAINE HCL (PF) 1 % IJ SOLN: 5.0000 mL | INTRAMUSCULAR | Status: DC | PRN

## 2019-07-24 MED ORDER — LIDOCAINE-PRILOCAINE 2.5-2.5 % EX CREA: 1.0000 "application " | TOPICAL_CREAM | CUTANEOUS | Status: DC | PRN

## 2019-07-24 MED ORDER — CHLORHEXIDINE GLUCONATE CLOTH 2 % EX PADS
6.0000 | MEDICATED_PAD | Freq: Every day | CUTANEOUS | Status: DC
  Administered 2019-07-24: 6 via TOPICAL

## 2019-07-24 MED ORDER — HEPARIN SODIUM (PORCINE) 1000 UNIT/ML DIALYSIS: 1000.0000 [IU] | INTRAMUSCULAR | Status: DC | PRN

## 2019-07-24 MED ORDER — INSULIN ASPART 100 UNIT/ML ~~LOC~~ SOLN
0.0000 [IU] | Freq: Three times a day (TID) | SUBCUTANEOUS | Status: DC
  Administered 2019-07-25 – 2019-07-26 (×3): 2 [IU] via SUBCUTANEOUS

## 2019-07-24 MED ORDER — PENTAFLUOROPROP-TETRAFLUOROETH EX AERO: 1.0000 "application " | INHALATION_SPRAY | CUTANEOUS | Status: DC | PRN

## 2019-07-24 NOTE — Progress Notes (Signed)
PROGRESS NOTE    [name]  VWU:[id number] DOB: 1946/03/13 DOA: 07/23/2019 PCP: [name], MD   Brief Narrative:  Per HPI: [name] is a 74 y.o. 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  Patient regularly goes on the community without wearing masks and is coming in with shortness of breath over the past several days.  Has been having a cough.  Shortness of breath worse with ambulation and improved with rest, although he is still quite tachypneic and short of breath with even the simplest conversation at rest.  He does get dialysis Monday Wednesday Friday, although he did not get dialysis yesterday due to feeling so ill.  Presented to the hospital for evaluation.  1/3: Patient has been admitted with acute hypoxemic respiratory failure secondary to COVID-19 pneumonia.  He is currently on 3 L nasal cannula and was initially refusing his medications, but is now agreeable.  Consulted nephrology placed for dialysis with next session likely 1-4.  Assessment & Plan:   Active Problems:   GERD (gastroesophageal reflux disease)   Coronary artery disease due to lipid rich plaque   Diabetes mellitus (HCC)   ESRD on hemodialysis (HCC)   Acute respiratory failure with hypoxia (HCC)   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[place])   Essential hypertension   Acute respiratory disease due to COVID-19 virus   Acute hypoxemic respiratory failure secondary to COVID-19 pneumonia -Continue remdesivir and Decadron -Wean oxygen as tolerated -Holding Jakafi -Continue monitoring of inflammatory markers  ESRD on HD MWF -Missed dialysis last Friday -Discussed with Dr. Hollie Salk who will see patient in consultation for dialysis while hospitalized  Diabetes -Continue SSI with stable blood glucose readings noted -Monitor carefully on steroids  CAD  -Continue aspirin and Lopressor  Hypertension -Currently controlled -Continue Lasix, Lopressor, and hydralazine  Myeloproliferative disease -Holding Jakafi  GERD -PPI   DVT prophylaxis: Heparin Code Status: Full Family Communication: None at bedside Disposition Plan: Continue treatment for COVID-19 pneumonia with dialysis while in facility.   Consultants:   Nephrology  Procedures:   None  Antimicrobials:  Anti-infectives (From admission, onward)   Start     Dose/Rate Route Frequency Ordered Stop   07/24/19 1000  remdesivir 100 mg in sodium chloride 0.9 % 100 mL IVPB     100 mg 200 mL/hr over 30 Minutes Intravenous Daily 07/23/19 1819 07/28/19 0959   07/23/19 1830  remdesivir 200 mg in sodium chloride 0.9% 250 mL IVPB     200 mg 580 mL/hr over 30 Minutes Intravenous Once 07/23/19 1819 07/23/19 2139       Subjective: Patient seen and evaluated today with no new acute complaints or concerns. No acute concerns or events noted overnight.  He is agreeable to treatment.  Objective: Vitals:   07/23/19 2100 07/23/19 2130 07/23/19 2200 07/24/19 0544  BP: (!) 162/75 (!) 163/84 (!) 172/89 (!) 148/83  Pulse: 80 85 84 70  Resp: 19 20 (!) 22   Temp:   98 F (36.7 C) 97.9 F (36.6 C)  TempSrc:   Oral Oral  SpO2: 98% 98% 99% 100%  Weight:   83 kg   Height:   6' (1.829 m)     Intake/Output Summary (Last 24 hours) at 07/24/2019 1015 Last data filed at 07/24/2019 0500 Gross per 24 hour  Intake 400 ml  Output -  Net 400 ml   [name]  Weights   07/23/19 1352 07/23/19 2200  Weight: 88.5 kg 83 kg    Examination:  General exam: Appears calm and comfortable  Respiratory system: Clear to auscultation. Respiratory effort normal.  Currently on 3 L nasal cannula oxygen. Cardiovascular system: S1 & S2 heard, RRR. No JVD, murmurs, rubs, gallops or clicks. No pedal edema. Gastrointestinal system: Abdomen is nondistended, soft and nontender. No organomegaly or masses felt. Normal  bowel sounds heard. Central nervous system: Alert and oriented. No focal neurological deficits. Extremities: Symmetric 5 x 5 power. Skin: No rashes, lesions or ulcers Psychiatry: Judgement and insight appear normal. Mood & affect appropriate.     Data Reviewed: I have personally reviewed following labs and imaging studies  CBC: Recent Labs  Lab 07/23/19 1506  WBC 7.5  NEUTROABS 2.8  HGB 11.0*  HCT 33.8*  MCV 95.5  PLT 109*   Basic Metabolic Panel: Recent Labs  Lab 07/23/19 1506  NA 135  K 4.0  CL 99  CO2 24  GLUCOSE 95  BUN 34*  CREATININE 5.52*  CALCIUM 7.9*  MG 1.7   GFR: Estimated Creatinine Clearance: 12.9 mL/min (A) (by C-G formula based on SCr of 5.52 mg/dL (H)). Liver Function Tests: Recent Labs  Lab 07/23/19 1506  AST 47*  ALT 28  ALKPHOS 187*  BILITOT 1.0  PROT 5.6*  ALBUMIN 2.6*   No results for input(s): LIPASE, AMYLASE in the last 168 hours. No results for input(s): AMMONIA in the last 168 hours. Coagulation Profile: No results for input(s): INR, PROTIME in the last 168 hours. Cardiac Enzymes: No results for input(s): CKTOTAL, CKMB, CKMBINDEX, TROPONINI in the last 168 hours. BNP (last 3 results) No results for input(s): PROBNP in the last 8760 hours. HbA1C: No results for input(s): HGBA1C in the last 72 hours. CBG: Recent Labs  Lab 07/23/19 2254 07/24/19 0407 07/24/19 0800  GLUCAP 122* 194* 162*   Lipid Profile: No results for input(s): CHOL, HDL, LDLCALC, TRIG, CHOLHDL, LDLDIRECT in the last 72 hours. Thyroid Function Tests: No results for input(s): TSH, T4TOTAL, FREET4, T3FREE, THYROIDAB in the last 72 hours. Anemia Panel: No results for input(s): VITAMINB12, FOLATE, FERRITIN, TIBC, IRON, RETICCTPCT in the last 72 hours. Sepsis Labs: Recent Labs  Lab 07/23/19 1754  PROCALCITON 1.73    No results found for this or any previous visit (from the past 240 hour(s)).       Radiology Studies: DG Chest Portable 1 View   Result Date: 07/23/2019 CLINICAL DATA:  Shortness of breath.  Cough. EXAM: PORTABLE CHEST 1 VIEW COMPARISON:  05/11/2019; 04/19/2019 FINDINGS: Grossly unchanged enlarged cardiac silhouette and mediastinal contours with atherosclerotic plaque within the thoracic aorta. Stable positioning of support apparatus. Interval development of bilateral ill-defined slightly nodular heterogeneous airspace opacities, right greater than left. No pleural effusion or pneumothorax. No evidence of edema. No acute osseous abnormalities. IMPRESSION: Ill-defined bilateral heterogeneous slightly nodular airspace opacities, nonspecific though worrisome for atypical infection such as COVID-19. Clinical correlation is advised. Electronically Signed   By: Sandi Mariscal M.D.   On: 07/23/2019 14:41        Scheduled Meds: . aspirin EC  81 mg Oral Q breakfast  . calcium acetate  667 mg Oral TID WC  . Chlorhexidine Gluconate Cloth  6 each Topical Daily  . dexamethasone  6 mg Oral Q24H  . DULoxetine  60 mg Oral Daily  . furosemide  40 mg Oral BID  . gabapentin  100 mg Oral QHS  . heparin  5,000 Units  Subcutaneous Q8H  . hydrALAZINE  50 mg Oral TID  . insulin aspart  0-15 Units Subcutaneous Q4H  . insulin detemir  0.075 Units/kg Subcutaneous BID  . methocarbamol  750 mg Oral TID  . metoprolol tartrate  25 mg Oral BID  . pravastatin  40 mg Oral Daily   Continuous Infusions: . remdesivir 100 mg in NS 100 mL       LOS: 1 day    Time spent: 30 minutes    Alan Suthers Darleen Crocker, DO [facility] Pager [phone]  If 7PM-7AM, please contact night-coverage [email or web address] Password [facility] 07/24/2019, 10:15 AM

## 2019-07-24 NOTE — Plan of Care (Signed)
  Problem: Education: Goal: Knowledge of General Education information will improve Description Including pain rating scale, medication(s)/side effects and non-pharmacologic comfort measures Outcome: Progressing   Problem: Health Behavior/Discharge Planning: Goal: Ability to manage health-related needs will improve Outcome: Progressing   

## 2019-07-24 NOTE — Progress Notes (Signed)
Critical Lab: MRSA positive. RN and MD notified.  Elodia Florence RN

## 2019-07-25 LAB — COMPREHENSIVE METABOLIC PANEL
ALT: 30 U/L (ref 0–44)
AST: 43 U/L — ABNORMAL HIGH (ref 15–41)
Albumin: 2.3 g/dL — ABNORMAL LOW (ref 3.5–5.0)
Alkaline Phosphatase: 143 U/L — ABNORMAL HIGH (ref 38–126)
Anion gap: 15 (ref 5–15)
BUN: 70 mg/dL — ABNORMAL HIGH (ref 8–23)
CO2: 20 mmol/L — ABNORMAL LOW (ref 22–32)
Calcium: 7.8 mg/dL — ABNORMAL LOW (ref 8.9–10.3)
Chloride: 98 mmol/L (ref 98–111)
Creatinine, Ser: 6.37 mg/dL — ABNORMAL HIGH (ref 0.61–1.24)
GFR calc Af Amer: 9 mL/min — ABNORMAL LOW (ref >60)
GFR calc non Af Amer: 8 mL/min — ABNORMAL LOW (ref >60)
Glucose, Bld: 68 mg/dL — ABNORMAL LOW (ref 70–99)
Potassium: 4.6 mmol/L (ref 3.5–5.1)
Sodium: 133 mmol/L — ABNORMAL LOW (ref 135–145)
Total Bilirubin: 0.8 mg/dL (ref 0.3–1.2)
Total Protein: 5.1 g/dL — ABNORMAL LOW (ref 6.5–8.1)

## 2019-07-25 LAB — URINE CULTURE: Culture: NO GROWTH

## 2019-07-25 LAB — GLUCOSE, CAPILLARY
Glucose-Capillary: 58 mg/dL — ABNORMAL LOW (ref 70–99)
Glucose-Capillary: 81 mg/dL (ref 70–99)
Glucose-Capillary: 82 mg/dL (ref 70–99)
Glucose-Capillary: 124 mg/dL — ABNORMAL HIGH (ref 70–99)
Glucose-Capillary: 127 mg/dL — ABNORMAL HIGH (ref 70–99)

## 2019-07-25 LAB — CBC WITH DIFFERENTIAL/PLATELET
Abs Immature Granulocytes: 1.82 10*3/uL — ABNORMAL HIGH (ref 0.00–0.07)
Basophils Absolute: 0.2 10*3/uL — ABNORMAL HIGH (ref 0.0–0.1)
Basophils Relative: 3 %
Eosinophils Absolute: 0 10*3/uL (ref 0.0–0.5)
Eosinophils Relative: 0 %
HCT: 32.6 % — ABNORMAL LOW (ref 39.0–52.0)
Hemoglobin: 10.8 g/dL — ABNORMAL LOW (ref 13.0–17.0)
Immature Granulocytes: 23 %
Lymphocytes Relative: 20 %
Lymphs Abs: 1.6 10*3/uL (ref 0.7–4.0)
MCH: 31.2 pg (ref 26.0–34.0)
MCHC: 33.1 g/dL (ref 30.0–36.0)
MCV: 94.2 fL (ref 80.0–100.0)
Monocytes Absolute: 1.1 10*3/uL — ABNORMAL HIGH (ref 0.1–1.0)
Monocytes Relative: 13 %
Neutro Abs: 3.4 10*3/uL (ref 1.7–7.7)
Neutrophils Relative %: 41 %
Platelets: 119 10*3/uL — ABNORMAL LOW (ref 150–400)
RBC: 3.46 MIL/uL — ABNORMAL LOW (ref 4.22–5.81)
RDW: 18.8 % — ABNORMAL HIGH (ref 11.5–15.5)
WBC: 8.1 10*3/uL (ref 4.0–10.5)
nRBC: 2.2 % — ABNORMAL HIGH (ref 0.0–0.2)

## 2019-07-25 LAB — D-DIMER, QUANTITATIVE: D-Dimer, Quant: 0.9 ug/mL-FEU — ABNORMAL HIGH (ref 0.00–0.50)

## 2019-07-25 LAB — PHOSPHORUS: Phosphorus: 7.7 mg/dL — ABNORMAL HIGH (ref 2.5–4.6)

## 2019-07-25 LAB — FERRITIN: Ferritin: 449 ng/mL — ABNORMAL HIGH (ref 24–336)

## 2019-07-25 LAB — PROCALCITONIN: Procalcitonin: 5.04 ng/mL

## 2019-07-25 LAB — C-REACTIVE PROTEIN: CRP: 10.6 mg/dL — ABNORMAL HIGH (ref <1.0)

## 2019-07-25 LAB — MAGNESIUM: Magnesium: 2 mg/dL (ref 1.7–2.4)

## 2019-07-25 MED ORDER — ALUM & MAG HYDROXIDE-SIMETH 200-200-20 MG/5ML PO SUSP
30.0000 mL | ORAL | Status: DC | PRN
  Administered 2019-07-25: 30 mL via ORAL
  Filled 2019-07-25: qty 30

## 2019-07-25 NOTE — Progress Notes (Signed)
Patient set up for OP Remdesivir infusion at 5:30 on Jan 5th-7th.

## 2019-07-25 NOTE — Progress Notes (Addendum)
Patient scheduled for outpatient Remdesivir infusion at 530PM on Wednesday 1/6.  Please advise them to report to [facility] at [address].  Drive to the security guard and tell them you are here for an infusion. They will direct you to the front entrance where we will come and get you.  For questions call [phone].  Thanks

## 2019-07-25 NOTE — Progress Notes (Signed)
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 Patient regularly goes on the community without wearing masks and is coming in with shortness of breath over the past several days. Has been having a cough. Shortness of breath worse with ambulation and improved with rest,although he is still quite tachypneic and short of breath with even the simplest conversation at rest. He does get dialysis Monday Wednesday Friday, although he did not get dialysis yesterday due to feeling so ill. Presented to the hospital for evaluation.  1/3: Patient has been admitted with acute hypoxemic respiratory failure secondary to COVID-19 pneumonia.  He is currently on 3 L nasal cannula and was initially refusing his medications, but is now agreeable.  Consulted nephrology placed for dialysis with next session likely 1-4.  1/4:Patient has ongoing treatment and needs to establish dialysis chair at [facility]. Will plan to get HD tomorrow and potential DC thereafter if chair is established along with outpatient infusions.  Assessment & Plan:   Active Problems:   GERD (gastroesophageal reflux disease)   Coronary artery disease due to lipid rich plaque   Diabetes mellitus (HCC)   ESRD on hemodialysis (HCC)   Acute respiratory failure with hypoxia (HCC)   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[place])   Essential hypertension   Acute respiratory disease due to COVID-19 virus   Acute hypoxemic respiratory failure secondary to COVID-19 pneumonia -Resolving and on RA currently -Continue remdesivir and Decadron -Wean oxygen as tolerated -Holding Jakafi -Continue monitoring of inflammatory markers with downward  trend  ESRD on HD MWF -Missed dialysis last Friday -Will get HD tomorrow; CSW working on HD chair placement at [facility] facility  Diabetes -Continue SSI with levemir decreased due to some hypoglycemia -Monitor carefully on steroids  CAD -Continue aspirin and Lopressor  Hypertension -Currently controlled -Continue Lasix, Lopressor, and hydralazine  Myeloproliferative disease -Holding Jakafi  GERD -PPI   DVT prophylaxis: Heparin Code Status: Full Family Communication: None at bedside Disposition Plan: Continue treatment for COVID-19 pneumonia with dialysis while in facility. HD anticipated tomorrow and then likely DC after once HD chair established.   Consultants:   Nephrology  Procedures:   None  Antimicrobials:  Anti-infectives (From admission, onward)   Start     Dose/Rate Route Frequency Ordered Stop   07/24/19 1000  remdesivir 100 mg in sodium chloride 0.9 % 100 mL IVPB     100 mg 200 mL/hr over 30 Minutes Intravenous Daily 07/23/19 1819 07/28/19 0959   07/23/19 1830  remdesivir 200 mg in sodium chloride 0.9% 250 mL IVPB     200 mg 580 mL/hr over 30 Minutes Intravenous Once 07/23/19 1819 07/23/19 2139       Subjective: Patient seen and evaluated today with no new acute complaints or concerns. No acute concerns or events noted overnight.  Objective: Vitals:   07/24/19 0544 07/24/19 1302 07/24/19 2154 07/25/19 0517  BP: (!) 148/83 (!) 148/82 139/75 (!) 102/51  Pulse: 70 67 75 68  Resp:  (!) 24 20 20   Temp: 97.9 F (36.6 C) 97.8 F (36.6 C) 97.8 F (36.6 C) 97.7 F (36.5 C)  TempSrc: Oral Oral Oral Oral  SpO2: 100% 94% 93% 91%  Weight:  Height:       No intake or output data in the 24 hours ending 07/25/19 1637 Filed Weights   07/23/19 1352 07/23/19 2200  Weight: 88.5 kg 83 kg    Examination:  General exam: Appears calm and comfortable  Respiratory system: Clear to auscultation. Respiratory effort normal. Currently on  RA. Cardiovascular system: S1 & S2 heard, RRR. No JVD, murmurs, rubs, gallops or clicks. No pedal edema. Gastrointestinal system: Abdomen is nondistended, soft and nontender. No organomegaly or masses felt. Normal bowel sounds heard. Central nervous system: Alert and oriented. No focal neurological deficits. Extremities: Symmetric 5 x 5 power. Skin: No rashes, lesions or ulcers Psychiatry: Judgement and insight appear normal. Mood & affect appropriate.     Data Reviewed: I have personally reviewed following labs and imaging studies  CBC: Recent Labs  Lab 07/23/19 1506 07/24/19 0835 07/25/19 0627  WBC 7.5 11.4* 8.1  NEUTROABS 2.8 4.1 3.4  HGB 11.0* 12.1* 10.8*  HCT 33.8* 36.8* 32.6*  MCV 95.5 95.1 94.2  PLT 129* 141* 643*   Basic Metabolic Panel: Recent Labs  Lab 07/23/19 1506 07/24/19 0835 07/25/19 0627  NA 135 133* 133*  K 4.0 4.1 4.6  CL 99 98 98  CO2 24 19* 20*  GLUCOSE 95 178* 68*  BUN 34* 51* 70*  CREATININE 5.52* 5.84* 6.37*  CALCIUM 7.9* 7.9* 7.8*  MG 1.7 1.9 2.0  PHOS  --  7.0* 7.7*   GFR: Estimated Creatinine Clearance: 11.2 mL/min (A) (by C-G formula based on SCr of 6.37 mg/dL (H)). Liver Function Tests: Recent Labs  Lab 07/23/19 1506 07/24/19 0835 07/25/19 0627  AST 47* 42* 43*  ALT 28 27 30   ALKPHOS 187* 179* 143*  BILITOT 1.0 0.8 0.8  PROT 5.6* 5.7* 5.1*  ALBUMIN 2.6* 2.6* 2.3*   No results for input(s): LIPASE, AMYLASE in the last 168 hours. No results for input(s): AMMONIA in the last 168 hours. Coagulation Profile: No results for input(s): INR, PROTIME in the last 168 hours. Cardiac Enzymes: No results for input(s): CKTOTAL, CKMB, CKMBINDEX, TROPONINI in the last 168 hours. BNP (last 3 results) No results for input(s): PROBNP in the last 8760 hours. HbA1C: No results for input(s): HGBA1C in the last 72 hours. CBG: Recent Labs  Lab 07/24/19 1635 07/24/19 2156 07/25/19 0812 07/25/19 1002 07/25/19 1238  GLUCAP 138* 109* 58* 81 82    Lipid Profile: No results for input(s): CHOL, HDL, LDLCALC, TRIG, CHOLHDL, LDLDIRECT in the last 72 hours. Thyroid Function Tests: No results for input(s): TSH, T4TOTAL, FREET4, T3FREE, THYROIDAB in the last 72 hours. Anemia Panel: Recent Labs    07/24/19 0835 07/25/19 0627  FERRITIN 412* 449*   Sepsis Labs: Recent Labs  Lab 07/23/19 1754 07/24/19 0835 07/25/19 0627  PROCALCITON 1.73 1.21 5.04    Recent Results (from the past 240 hour(s))  Urine culture     Status: None   Collection Time: 07/23/19  5:30 PM   Specimen: Urine, Clean Catch  Result Value Ref Range Status   Specimen Description   Final    URINE, CLEAN CATCH Performed at [facility], [address], [place], [place] [address]    Special Requests   Final    NONE Performed at [facility], [address] [place], [place] [address]    Culture   Final    NO GROWTH Performed at [facility], [place] [address], [place], [place] [address]    Report Status 07/25/2019 FINAL  Final  MRSA  PCR Screening     Status: Abnormal   Collection Time: 07/24/19  5:33 AM   Specimen: Nasopharyngeal  Result Value Ref Range Status   MRSA by PCR POSITIVE (A) NEGATIVE Final    Comment:        The GeneXpert MRSA Assay (FDA approved for NASAL specimens only), is one component of a comprehensive MRSA colonization surveillance program. It is not intended to diagnose MRSA infection nor to guide or monitor treatment for MRSA infections. CRITICAL RESULT CALLED TO, READ BACK BY AND VERIFIED WITH: DIANA BENSON,RN @1118  07/24/2019 KAY Performed at [facility], [address], [place], [place] [address]          Radiology Studies: No results found.      Scheduled Meds: . aspirin EC  81 mg Oral Q breakfast  . calcium acetate  667 mg Oral TID WC  . Chlorhexidine Gluconate Cloth  6 each Topical Daily  . Chlorhexidine Gluconate Cloth  6 each Topical Q0600  . Chlorhexidine Gluconate Cloth  6 each Topical Q0600  .  dexamethasone  6 mg Oral Q24H  . DULoxetine  60 mg Oral Daily  . furosemide  40 mg Oral BID  . gabapentin  100 mg Oral QHS  . heparin  5,000 Units Subcutaneous Q8H  . hydrALAZINE  50 mg Oral TID  . insulin aspart  0-15 Units Subcutaneous TID AC & HS  . insulin detemir  0.075 Units/kg Subcutaneous BID  . methocarbamol  750 mg Oral TID  . metoprolol tartrate  25 mg Oral BID  . mupirocin ointment  1 application Nasal BID  . pravastatin  40 mg Oral Daily   Continuous Infusions: . sodium chloride    . sodium chloride    . remdesivir 100 mg in NS 100 mL 100 mg (07/25/19 1316)     LOS: 2 days    Time spent: 30 minutes    Jessa Stinson Darleen Crocker, DO [facility] Pager [phone]  If 7PM-7AM, please contact night-coverage [email or web address] Password [facility] 07/25/2019, 4:37 PM

## 2019-07-25 NOTE — Consult Note (Signed)
[name] Admit Date: 07/23/2019 07/25/2019 [name] Requesting Physician:  Manuella Ghazi DO  Reason for Consult:  ESRD HPI:  82M ESRD MWF [facility] admitted on 1/2 after presenting with dyspnea.  Diagnosed with COVID-19 infection.  Other past history includes CHF, CAD with history of PCI, hypertension, GERD, myelodysplastic syndrome, DM 2.  Patient required nasal cannula oxygen, though on room air at the time of my evaluation.  Currently being treated with dexamethasone and remdesivir.  He states he did not go to dialysis on Friday, as he did not feel like getting out of bed.  Currently his labs have a K4.6, HCO3 20, BUN 70, hemoglobin 10.8.    No fever.  Blood pressures are stable.  Uses L FA AVF.      Creatinine, Ser (mg/dL)  Date Value  07/25/2019 6.37 (H)  07/24/2019 5.84 (H)  07/23/2019 5.52 (H)  07/03/2019 5.29 (H)  05/11/2019 4.91 (H)  04/20/2019 5.15 (H)  04/19/2019 4.90 (H)  04/14/2019 4.40 (H)  05/16/2018 2.15 (H)  03/08/2018 1.97 (H)  ]  Balance of 12 systems is negative w/ exceptions as above  PMH  Past Medical History:  Diagnosis Date  . Anxiety   . Arthritis   . BPH (benign prostatic hyperplasia)   . CAD (coronary artery disease)    DES to circumflex 07/2016, moderate residual LAD and RCA, small 90% OM3 - managed medically  . CHF (congestive heart failure) ([place])   . Chronic lower back pain   . Depression   . ESRD (end stage renal disease) ([place])    Hemo MWF [facility]  . Essential hypertension   . Gastritis   . GERD (gastroesophageal reflux disease)   . History of kidney stones   . Leukemia ([place])   . Leukocytosis   . 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[place])   . 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[place])    Likely MDS/MPN, unclassifiable - folowed at [facility]  . Pleural effusion    had Thorancentesis at [facility]  . Pneumonia    last time 08/2018- Aspiration Pneumonia - unintentional opiate overdose  . Renal insufficiency   . Spleen enlarged   . Thrombocythemia  ([place])   . Type II diabetes mellitus (HCC)    PSH  Past Surgical History:  Procedure Laterality Date  . AV FISTULA PLACEMENT Left 04/14/2019   Procedure: ARTERIOVENOUS (AV) FISTULA CREATION LEFT ARM;  Surgeon: Rosetta Posner, MD;  Location: [facility];  Service: Vascular;  Laterality: Left;  . BIOPSY  12/08/2017   Procedure: BIOPSY;  Surgeon: Danie Binder, MD;  [facility];  Service: Endoscopy;;  gastric  . BONE MARROW BIOPSY     x 3 times  . CARDIAC CATHETERIZATION N/A 08/12/2016   Procedure: Left Heart Cath and Coronary Angiography;  Surgeon: Jettie Booze, MD;  Location: [facility];  Service: Cardiovascular;  Laterality: N/A;  . CARDIAC CATHETERIZATION N/A 08/12/2016   Procedure: Coronary Stent Intervention;  Surgeon: Jettie Booze, MD;  Location: [facility];  Service: Cardiovascular;  Laterality: N/A;  . CARDIAC CATHETERIZATION  2000s X 1  . COLONOSCOPY WITH PROPOFOL N/A 02/01/2013   SLF: 1. 23 colon polyps removed. 2 retrieved. 2. Mild diverticulosis in teh sigmoid colon 3. Small internal hemorrhoids 4. The colon is redundant   . COLONOSCOPY WITH PROPOFOL N/A 12/08/2017   Procedure: COLONOSCOPY WITH PROPOFOL;  Surgeon: Danie Binder, MD;  [facility];  Service: Endoscopy;  Laterality: N/A;  1:45pm  . CYSTOSCOPY W/  STONE MANIPULATION    . ESOPHAGOGASTRODUODENOSCOPY (EGD) WITH PROPOFOL N/A 02/01/2013   SLF: 1. Moderate erosive gastritis  . ESOPHAGOGASTRODUODENOSCOPY (EGD) WITH PROPOFOL N/A 12/08/2017   Procedure: ESOPHAGOGASTRODUODENOSCOPY (EGD) WITH PROPOFOL;  Surgeon: Danie Binder, MD;  [facility];  Service: Endoscopy;  Laterality: N/A;  . LUMBAR [place]  . POLYPECTOMY N/A 02/01/2013   Procedure: POLYPECTOMY;  Surgeon: Danie Binder, MD;  Location: [facility];  Service: Endoscopy;  Laterality: N/A;   FH  Family History  Problem Relation Age of Onset  . Heart attack Mother   . CVA Mother   . Heart attack Father    . Colon cancer Neg Hx    SH  reports that he quit smoking about 5 years ago. His smoking use included cigars. He started smoking about 44 years ago. He has a 20.00 pack-year smoking history. He has never used smokeless tobacco. He reports previous alcohol use. He reports that he does not use drugs. Allergies  Allergies  Allergen Reactions  . Hydrocodone Itching   Home medications Prior to Admission medications   Medication Sig Start Date End Date Taking? Authorizing Provider  acetaminophen (TYLENOL) 500 MG tablet Take 1,000 mg by mouth 2 (two) times daily as needed for mild pain or moderate pain.    Yes [provider]  allopurinol (ZYLOPRIM) 100 MG tablet Take 200 mg by mouth every morning.   Yes [provider]  DULoxetine (CYMBALTA) 60 MG capsule Take 1 capsule (60 mg total) by mouth daily. 04/21/19  Yes Emokpae, Courage, MD  gabapentin (NEURONTIN) 100 MG capsule Take 1 capsule (100 mg total) by mouth at bedtime. 04/21/19  Yes Emokpae, Courage, MD  hydrALAZINE (APRESOLINE) 50 MG tablet Take 2 tablets (100 mg total) by mouth 3 (three) times daily. 10/20/18  Yes Branch, Alphonse Guild, MD  HYDROcodone-acetaminophen (NORCO/VICODIN) 5-325 MG tablet Take 1 tablet by mouth 3 (three) times daily. 06/30/19  Yes [provider]  methocarbamol (ROBAXIN) 750 MG tablet Take 1 tablet (750 mg total) by mouth 3 (three) times daily. For muscle pain/spasm Patient taking differently: Take 750 mg by mouth 3 (three) times daily as needed. For muscle pain/spasm 04/21/19  Yes Emokpae, Courage, MD  metoprolol tartrate (LOPRESSOR) 25 MG tablet Take 1 tablet (25 mg total) by mouth 2 (two) times daily. 04/21/19  Yes Emokpae, Courage, MD  nitroGLYCERIN (NITROSTAT) 0.4 MG SL tablet Place 1 tablet (0.4 mg total) under the tongue every 5 (five) minutes x 3 doses as needed for chest pain (if no relief after 3rd dose, proceed to the ED for an evaluation or call 911). 10/04/18  Yes BranchAlphonse Guild, MD   ruxolitinib phosphate (JAKAFI) 5 MG tablet Take 5 mg by mouth daily.   Yes [provider]  traZODone (DESYREL) 50 MG tablet Take 1 tablet (50 mg total) by mouth at bedtime as needed for sleep. 04/21/19  Yes Roxan Hockey, MD  aspirin EC 81 MG EC tablet Take 1 tablet (81 mg total) by mouth daily with breakfast. Patient not taking: Reported on 07/24/2019 04/21/19   Roxan Hockey, MD  calcium acetate (PHOSLO) 667 MG capsule Take 667 mg by mouth 3 (three) times daily with meals.    [provider]  furosemide (LASIX) 40 MG tablet Take 40 mg by mouth 2 (two) times daily. 06/17/19   [provider]  hydrOXYzine (ATARAX/VISTARIL) 25 MG tablet Take 1 tablet (25 mg total) by mouth every 8 (eight) hours as needed for  anxiety, itching or nausea. Patient not taking: Reported on 07/24/2019 04/21/19   Roxan Hockey, MD  oxyCODONE-acetaminophen (PERCOCET/ROXICET) 5-325 MG tablet Take 1 tablet by mouth every 4 (four) hours as needed. Patient not taking: Reported on 07/24/2019 07/03/19   Evalee Jefferson, PA-C  pravastatin (PRAVACHOL) 40 MG tablet Take 40 mg by mouth daily. 07/01/19   [provider]    Current Medications Scheduled Meds: . aspirin EC  81 mg Oral Q breakfast  . calcium acetate  667 mg Oral TID WC  . Chlorhexidine Gluconate Cloth  6 each Topical Daily  . Chlorhexidine Gluconate Cloth  6 each Topical Q0600  . Chlorhexidine Gluconate Cloth  6 each Topical Q0600  . dexamethasone  6 mg Oral Q24H  . DULoxetine  60 mg Oral Daily  . furosemide  40 mg Oral BID  . gabapentin  100 mg Oral QHS  . heparin  5,000 Units Subcutaneous Q8H  . hydrALAZINE  50 mg Oral TID  . insulin aspart  0-15 Units Subcutaneous TID AC & HS  . insulin detemir  0.075 Units/kg Subcutaneous BID  . methocarbamol  750 mg Oral TID  . metoprolol tartrate  25 mg Oral BID  . mupirocin ointment  1 application Nasal BID  . pravastatin  40 mg Oral Daily   Continuous Infusions: . sodium chloride     . sodium chloride    . remdesivir 100 mg in NS 100 mL Stopped (07/24/19 1411)   PRN Meds:.sodium chloride, sodium chloride, chlorpheniramine-HYDROcodone, guaiFENesin-dextromethorphan, heparin, heparin, hydrOXYzine, lidocaine (PF), lidocaine-prilocaine, ondansetron **OR** ondansetron (ZOFRAN) IV, oxyCODONE-acetaminophen, pentafluoroprop-tetrafluoroeth, traZODone  CBC Recent Labs  Lab 07/23/19 1506 07/24/19 0835 07/25/19 0627  WBC 7.5 11.4* 8.1  NEUTROABS 2.8 4.1 3.4  HGB 11.0* 12.1* 10.8*  HCT 33.8* 36.8* 32.6*  MCV 95.5 95.1 94.2  PLT 129* 141* 742*   Basic Metabolic Panel Recent Labs  Lab 07/23/19 1506 07/24/19 0835 07/25/19 0627  NA 135 133* 133*  K 4.0 4.1 4.6  CL 99 98 98  CO2 24 19* 20*  GLUCOSE 95 178* 68*  BUN 34* 51* 70*  CREATININE 5.52* 5.84* 6.37*  CALCIUM 7.9* 7.9* 7.8*  PHOS  --  7.0* 7.7*    Physical Exam  Blood pressure (!) 102/51, pulse 68, temperature 97.7 F (36.5 C), temperature source Oral, resp. rate 20, height 6' (1.829 m), weight 83 kg, SpO2 91 %. GEN: Walking around in room on room air, ENT: NCAT EYES: EOMI CV: Regular, normal S1 and S2 PULM: Clear bilaterally ABD: Soft, nontender SKIN: No rashes or lesions, some bruising across arms EXT: Trace lower extremity edema NEURO: Nonfocal Vascular: AV fistula in left forearm with bruit and thrill   Assessment 58M ESRD with COVID19  1. ESRD MWF [facility] L FA AVF 2. COVID 19 per [facility], Remdesivir and Dexamethasone 3. ANemia, Hb at goal 4. HTN,/VOl. Appears stable 5. CKD-BMD: P up, cont binders 6. CHF 7. CAD hx/o PCI 8. MDS  Plan 1. Will need to transition to HD TH[facility] and set up for outpt HD at [facility].   2. HD tomorrow most likley given surge, 3h, 2K, No heparin, 400/800, 3L UF using AVF   [name]  [phone] 07/25/2019, 9:36 AM

## 2019-07-25 NOTE — Plan of Care (Signed)

## 2019-07-25 NOTE — Progress Notes (Signed)
Inpatient Diabetes Program Recommendations  AACE/ADA: New Consensus Statement on Inpatient Glycemic Control (2015)  Target Ranges:  Prepandial:   less than 140 mg/dL      Peak postprandial:   less than 180 mg/dL (1-2 hours)      Critically ill patients:  140 - 180 mg/dL   Lab Results  Component Value Date   GLUCAP 81 07/25/2019   HGBA1C 6.0 (H) 04/26/2016    Review of Glycemic Control Results for CLELL, TRAHAN (MRN [id number]) as of 07/25/2019 11:07  Ref. Range 07/24/2019 16:35 07/24/2019 21:56 07/25/2019 08:12 07/25/2019 10:02  Glucose-Capillary Latest Ref Range: 70 - 99 mg/dL 138 (H) 109 (H) 58 (L) 81   Diabetes history: Type 2 DM Outpatient Diabetes medications: none Current orders for Inpatient glycemic control: Levemir 7 units BID, Novolog 0-15 units TID&HS Decadron 6 mg QD  Inpatient Diabetes Program Recommendations:    Noted FSBG of 58 mg/dL. Consider decreasing Levemir to 7 units QHS.   Thanks, Bronson Curb, MSN, RNC-OB Diabetes Coordinator [phone] (8a-5p)

## 2019-07-26 ENCOUNTER — Encounter (HOSPITAL_COMMUNITY): Payer: Self-pay

## 2019-07-26 ENCOUNTER — Ambulatory Visit (HOSPITAL_COMMUNITY): Payer: Medicare Other

## 2019-07-26 ENCOUNTER — Ambulatory Visit (HOSPITAL_COMMUNITY)
Admission: RE | Admit: 2019-07-26 | Discharge: 2019-07-26 | Disposition: A | Discharge status: Home / Self Care | Payer: Medicare Other | Source: Ambulatory Visit | Attending: Pulmonary Disease | Admitting: Pulmonary Disease

## 2019-07-26 DIAGNOSIS — U071 COVID-19: Secondary | ICD-10-CM | POA: Insufficient documentation

## 2019-07-26 DIAGNOSIS — J9600 Acute respiratory failure, unspecified whether with hypoxia or hypercapnia: Secondary | ICD-10-CM

## 2019-07-26 DIAGNOSIS — I2583 Coronary atherosclerosis due to lipid rich plaque: Secondary | ICD-10-CM

## 2019-07-26 DIAGNOSIS — Z992 Dependence on renal dialysis: Secondary | ICD-10-CM

## 2019-07-26 DIAGNOSIS — J9601 Acute respiratory failure with hypoxia: Secondary | ICD-10-CM

## 2019-07-26 DIAGNOSIS — J069 Acute upper respiratory infection, unspecified: Secondary | ICD-10-CM | POA: Insufficient documentation

## 2019-07-26 DIAGNOSIS — I10 Essential (primary) hypertension: Secondary | ICD-10-CM

## 2019-07-26 DIAGNOSIS — E1122 Type 2 diabetes mellitus with diabetic chronic kidney disease: Secondary | ICD-10-CM

## 2019-07-26 DIAGNOSIS — I2510 Atherosclerotic heart disease of native coronary artery without angina pectoris: Secondary | ICD-10-CM

## 2019-07-26 DIAGNOSIS — D471 Chronic myeloproliferative disease: Secondary | ICD-10-CM

## 2019-07-26 DIAGNOSIS — N186 End stage renal disease: Secondary | ICD-10-CM

## 2019-07-26 LAB — CBC WITH DIFFERENTIAL/PLATELET
Abs Immature Granulocytes: 2.19 10*3/uL — ABNORMAL HIGH (ref 0.00–0.07)
Basophils Absolute: 0.3 10*3/uL — ABNORMAL HIGH (ref 0.0–0.1)
Basophils Relative: 2 %
Eosinophils Absolute: 0 10*3/uL (ref 0.0–0.5)
Eosinophils Relative: 0 %
HCT: 31.6 % — ABNORMAL LOW (ref 39.0–52.0)
Hemoglobin: 10.8 g/dL — ABNORMAL LOW (ref 13.0–17.0)
Immature Granulocytes: 18 %
Lymphocytes Relative: 12 %
Lymphs Abs: 1.5 10*3/uL (ref 0.7–4.0)
MCH: 32 pg (ref 26.0–34.0)
MCHC: 34.2 g/dL (ref 30.0–36.0)
MCV: 93.5 fL (ref 80.0–100.0)
Monocytes Absolute: 1.8 10*3/uL — ABNORMAL HIGH (ref 0.1–1.0)
Monocytes Relative: 14 %
Neutro Abs: 6.6 10*3/uL (ref 1.7–7.7)
Neutrophils Relative %: 54 %
Platelets: 126 10*3/uL — ABNORMAL LOW (ref 150–400)
RBC: 3.38 MIL/uL — ABNORMAL LOW (ref 4.22–5.81)
RDW: 18.9 % — ABNORMAL HIGH (ref 11.5–15.5)
WBC: 12.4 10*3/uL — ABNORMAL HIGH (ref 4.0–10.5)
nRBC: 2.8 % — ABNORMAL HIGH (ref 0.0–0.2)

## 2019-07-26 LAB — C-REACTIVE PROTEIN: CRP: 11.6 mg/dL — ABNORMAL HIGH (ref <1.0)

## 2019-07-26 LAB — MAGNESIUM: Magnesium: 2 mg/dL (ref 1.7–2.4)

## 2019-07-26 LAB — GLUCOSE, CAPILLARY
Glucose-Capillary: 138 mg/dL — ABNORMAL HIGH (ref 70–99)
Glucose-Capillary: 144 mg/dL — ABNORMAL HIGH (ref 70–99)

## 2019-07-26 LAB — PHOSPHORUS: Phosphorus: 8.4 mg/dL — ABNORMAL HIGH (ref 2.5–4.6)

## 2019-07-26 LAB — COMPREHENSIVE METABOLIC PANEL
ALT: 32 U/L (ref 0–44)
AST: 48 U/L — ABNORMAL HIGH (ref 15–41)
Albumin: 2.3 g/dL — ABNORMAL LOW (ref 3.5–5.0)
Alkaline Phosphatase: 148 U/L — ABNORMAL HIGH (ref 38–126)
Anion gap: 15 (ref 5–15)
BUN: 87 mg/dL — ABNORMAL HIGH (ref 8–23)
CO2: 20 mmol/L — ABNORMAL LOW (ref 22–32)
Calcium: 7.6 mg/dL — ABNORMAL LOW (ref 8.9–10.3)
Chloride: 96 mmol/L — ABNORMAL LOW (ref 98–111)
Creatinine, Ser: 7.19 mg/dL — ABNORMAL HIGH (ref 0.61–1.24)
GFR calc Af Amer: 8 mL/min — ABNORMAL LOW (ref >60)
GFR calc non Af Amer: 7 mL/min — ABNORMAL LOW (ref >60)
Glucose, Bld: 136 mg/dL — ABNORMAL HIGH (ref 70–99)
Potassium: 4.7 mmol/L (ref 3.5–5.1)
Sodium: 131 mmol/L — ABNORMAL LOW (ref 135–145)
Total Bilirubin: 1 mg/dL (ref 0.3–1.2)
Total Protein: 5.2 g/dL — ABNORMAL LOW (ref 6.5–8.1)

## 2019-07-26 LAB — D-DIMER, QUANTITATIVE: D-Dimer, Quant: 1.02 ug/mL-FEU — ABNORMAL HIGH (ref 0.00–0.50)

## 2019-07-26 LAB — FERRITIN: Ferritin: 491 ng/mL — ABNORMAL HIGH (ref 24–336)

## 2019-07-26 MED ORDER — DEXAMETHASONE 6 MG PO TABS: 6.0000 mg | ORAL_TABLET | ORAL | 0 refills | Status: DC

## 2019-07-26 NOTE — Care Management Important Message (Signed)
Important Message  Patient Details  Name: [name] MRN: [id number] Date of Birth: Jan 01, 1946   Medicare Important Message Given:  Yes(Shannon, RN will deliver letter to patient due to contact precautions)     Tommy Medal 07/26/2019, 4:27 PM

## 2019-07-26 NOTE — Progress Notes (Signed)
Nsg Discharge Note  Admit Date:  07/23/2019 Discharge date: 07/26/2019   [name] to be D/C'd Home per MD order.  AVS completed.  Copy for chart, and copy for patient signed, and dated. Removed IV-clean, dry, intact. Call daughter [name] and discussed d/c paperwork. Reviewed future appointments at [facility] and dialysis center in [place]. Answered all questions. Wheeled stable patient and belongings to ED entrance where his other daughter was there to pick him up. I reviewed the paperwork with her also. Patient/caregiver able to verbalize understanding.  Discharge Medication: Allergies as of 07/26/2019      Reactions   Hydrocodone Itching      Medication List    STOP taking these medications   oxyCODONE-acetaminophen 5-325 MG tablet Commonly known as: PERCOCET/ROXICET     TAKE these medications   acetaminophen 500 MG tablet Commonly known as: TYLENOL Take 1,000 mg by mouth 2 (two) times daily as needed for mild pain or moderate pain.   allopurinol 100 MG tablet Commonly known as: ZYLOPRIM Take 200 mg by mouth every morning.   aspirin 81 MG EC tablet Take 1 tablet (81 mg total) by mouth daily with breakfast.   dexamethasone 6 MG tablet Commonly known as: DECADRON Take 1 tablet (6 mg total) by mouth daily. Start taking on: July 27, 2019   DULoxetine 60 MG capsule Commonly known as: CYMBALTA Take 1 capsule (60 mg total) by mouth daily.   furosemide 40 MG tablet Commonly known as: LASIX Take 40 mg by mouth 2 (two) times daily.   gabapentin 100 MG capsule Commonly known as: NEURONTIN Take 1 capsule (100 mg total) by mouth at bedtime.   hydrALAZINE 50 MG tablet Commonly known as: APRESOLINE Take 2 tablets (100 mg total) by mouth 3 (three) times daily.   HYDROcodone-acetaminophen 5-325 MG tablet Commonly known as: NORCO/VICODIN Take 1 tablet by mouth 3 (three) times daily.   hydrOXYzine 25 MG tablet Commonly known as: ATARAX/VISTARIL Take 1 tablet (25 mg total) by  mouth every 8 (eight) hours as needed for anxiety, itching or nausea.   methocarbamol 750 MG tablet Commonly known as: ROBAXIN Take 1 tablet (750 mg total) by mouth 3 (three) times daily. For muscle pain/spasm What changed:   when to take this  reasons to take this   metoprolol tartrate 25 MG tablet Commonly known as: LOPRESSOR Take 1 tablet (25 mg total) by mouth 2 (two) times daily.   nitroGLYCERIN 0.4 MG SL tablet Commonly known as: NITROSTAT Place 1 tablet (0.4 mg total) under the tongue every 5 (five) minutes x 3 doses as needed for chest pain (if no relief after 3rd dose, proceed to the ED for an evaluation or call 911).   PhosLo 667 MG capsule Generic drug: calcium acetate Take 667 mg by mouth 3 (three) times daily with meals.   pravastatin 40 MG tablet Commonly known as: PRAVACHOL Take 40 mg by mouth daily.   ruxolitinib phosphate 5 MG tablet Commonly known as: JAKAFI Take 5 mg by mouth daily.   traZODone 50 MG tablet Commonly known as: DESYREL Take 1 tablet (50 mg total) by mouth at bedtime as needed for sleep.       Discharge Assessment: Vitals:   07/26/19 1330 07/26/19 1400  BP: 126/73 109/78  Pulse: (!) 59 61  Resp:    Temp:    SpO2:     Skin clean, dry and intact without evidence of skin break down, no evidence of skin tears noted. IV catheter discontinued  intact. Site without signs and symptoms of complications - no redness or edema noted at insertion site, patient denies c/o pain - only slight tenderness at site.  Dressing with slight pressure applied.  D/c Instructions-Education: Discharge instructions given to patient/family with verbalized understanding. D/c education completed with patient/family including follow up instructions, medication list, d/c activities limitations if indicated, with other d/c instructions as indicated by MD - patient able to verbalize understanding, all questions fully answered. Patient instructed to return to ED, call  911, or call MD for any changes in condition.  Patient escorted via [place], and D/C home via private auto.  Santa Lighter, RN 07/26/2019 4:48 PM

## 2019-07-26 NOTE — Discharge Summary (Addendum)
Physician Discharge Summary  [name] YHC:[id number] DOB: 01-03-1946 DOA: 07/23/2019  PCP: [name], MD  Admit date: 07/23/2019 Discharge date: 07/26/2019  Admitted From: home Disposition:  home  Recommendations for Outpatient Follow-up:  1. Follow up with PCP in 1-2 weeks 2. Please obtain BMP/CBC in one week 3. Patient will have final dose of IV remdesivir at the [facility]fusion clinic on 1/6 4. Report to dialysis center in [place] for HD on 1/7   Discharge Condition:stable CODE STATUS:full code Diet recommendation: heart healthy, carb modified  Brief/Interim Summary: 74 year old male with a history of end-stage renal disease on hemodialysis, coronary artery disease, hypertension, myelodysplastic syndrome, diabetes, presents to the emergency room with complaints of shortness of breath.  He has been complaining of cough and shortness of breath with ambulation.  He was evaluated in the emergency room and found to be Covid positive by antigen test.  Patient was started on steroids as well as intravenous remdesivir.  He was seen in the hospital with nephrology and underwent dialysis.  Overall respiratory status has improved with treatment.  He has completed a total of 4 days of remdesivir in the hospital.  Final dose of remdesivir has been set up to be administered in the infusion center.  Currently, patient shortness of breath has resolved.  He is breathing comfortably on room air.  He will complete his course of Decadron for 10 days.  He is advised to return to the hospital if he has any recurrence of fever or worsening shortness of breath.  He is also advised to isolate at home until 08/13/2019.  Arrangements have been made for him to get dialysis in [place] since he is Covid positive.  Patient is otherwise stable for discharge.  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[place])   Essential hypertension   Acute respiratory disease due to COVID-19 virus Chronic diastolic CHF   Discharge Instructions  Discharge Instructions    Diet - low sodium heart healthy   Complete by: As directed    Increase activity slowly   Complete by: As directed    MyChart COVID-19 home monitoring program   Complete by: Jul 26, 2019    Is the patient willing to use the [place] for home monitoring?: Yes   Temperature monitoring   Complete by: Jul 26, 2019    After how many days would you like to receive a notification of this patient's flowsheet entries?: 1     Allergies as of 07/26/2019      Reactions   Hydrocodone Itching      Medication List    STOP taking these medications   oxyCODONE-acetaminophen 5-325 MG tablet Commonly known as: PERCOCET/ROXICET     TAKE these medications   acetaminophen 500 MG tablet Commonly known as: TYLENOL Take 1,000 mg by mouth 2 (two) times daily as needed for mild pain or moderate pain.   allopurinol 100 MG tablet Commonly known as: ZYLOPRIM Take 200 mg by mouth every morning.   aspirin 81 MG EC tablet Take 1 tablet (81 mg total) by mouth daily with breakfast.   dexamethasone 6 MG tablet Commonly known as: DECADRON Take 1 tablet (6 mg total) by mouth daily. Start taking on: July 27, 2019   DULoxetine 60 MG  capsule Commonly known as: CYMBALTA Take 1 capsule (60 mg total) by mouth daily.   furosemide 40 MG tablet Commonly known as: LASIX Take 40 mg by mouth 2 (two) times daily.   gabapentin 100 MG capsule Commonly known as: NEURONTIN Take 1 capsule (100 mg total) by mouth at bedtime.   hydrALAZINE 50 MG tablet Commonly known as: APRESOLINE Take 2 tablets (100 mg total) by mouth 3 (three) times daily.   HYDROcodone-acetaminophen 5-325 MG tablet Commonly known as: NORCO/VICODIN Take 1  tablet by mouth 3 (three) times daily.   hydrOXYzine 25 MG tablet Commonly known as: ATARAX/VISTARIL Take 1 tablet (25 mg total) by mouth every 8 (eight) hours as needed for anxiety, itching or nausea.   methocarbamol 750 MG tablet Commonly known as: ROBAXIN Take 1 tablet (750 mg total) by mouth 3 (three) times daily. For muscle pain/spasm What changed:   when to take this  reasons to take this   metoprolol tartrate 25 MG tablet Commonly known as: LOPRESSOR Take 1 tablet (25 mg total) by mouth 2 (two) times daily.   nitroGLYCERIN 0.4 MG SL tablet Commonly known as: NITROSTAT Place 1 tablet (0.4 mg total) under the tongue every 5 (five) minutes x 3 doses as needed for chest pain (if no relief after 3rd dose, proceed to the ED for an evaluation or call 911).   PhosLo 667 MG capsule Generic drug: calcium acetate Take 667 mg by mouth 3 (three) times daily with meals.   pravastatin 40 MG tablet Commonly known as: PRAVACHOL Take 40 mg by mouth daily.   ruxolitinib phosphate 5 MG tablet Commonly known as: JAKAFI Take 5 mg by mouth daily.   traZODone 50 MG tablet Commonly known as: DESYREL Take 1 tablet (50 mg total) by mouth at bedtime as needed for sleep.      Follow-up Information    Dialysis, [facility] Follow up.   Why: report for dialysis Thursday 7th at 9 am Contact information: [place] [address] [phone]        [place] Follow up.   Why: go to [facility] on 07/27/19 at 5:30,  inform the security guard you are there for infusion clinic, number is [phone]. The Security guard will contact the clinic and a staff member will come to escort the you to the clinic.  Contact information: [place] [phone]         Allergies  Allergen Reactions  . Hydrocodone Itching    Consultations:  Nephrology   Procedures/Studies: CT ABDOMEN PELVIS WO  CONTRAST  Result Date: 07/03/2019 CLINICAL DATA:  Abdominal pain. Bowel obstruction suspected. History of leukemia and kidney stones. Currently on dialysis. EXAM: CT ABDOMEN AND PELVIS WITHOUT CONTRAST TECHNIQUE: Multidetector CT imaging of the abdomen and pelvis was performed following the standard protocol without IV contrast. COMPARISON:  04/19/2019 FINDINGS: Lower chest: There are small bilateral pleural effusions. There is adjacent compressive atelectasis.The heart size is enlarged. A dialysis catheter is partially visualized extending into the right atrium. There is interlobular septal thickening. Hepatobiliary: The liver is enlarged. Normal gallbladder.There is no biliary ductal dilation. Pancreas: Normal contours without ductal dilatation. No peripancreatic fluid collection. Spleen: The spleen is significantly enlarged. Adrenals/Urinary Tract: --Adrenal glands: No adrenal hemorrhage. --Right kidney/ureter: There is cortical thinning of the right kidney without evidence for hydronephrosis --Left kidney/ureter: . There is cortical thinning of the left kidney without evidence for hydronephrosis. --Urinary bladder:  The bladder is decompressed and therefore is poorly evaluated on this exam. Stomach/Bowel: --Stomach/Duodenum: No hiatal hernia or other gastric abnormality. Normal duodenal course and caliber. --Small bowel: No dilatation or inflammation. --Colon: No focal abnormality. --Appendix: Normal. Vascular/Lymphatic: Atherosclerotic calcification is present within the non-aneurysmal abdominal aorta, without hemodynamically significant stenosis. --No retroperitoneal lymphadenopathy. --No mesenteric lymphadenopathy. --No pelvic or inguinal lymphadenopathy. Reproductive: Unremarkable Other: There is a small volume of abdominal ascites. There are bilateral fat containing inguinal hernias. There is mild body wall edema. Musculoskeletal. No acute displaced fractures. IMPRESSION: 1. No acute abdominal or pelvic  pathology. No evidence for small bowel obstruction. 2. Hepatosplenomegaly with small volume ascites. 3. Small bilateral pleural effusions with adjacent compressive atelectasis. There is interlobular septal thickening with body wall edema, consistent with volume overload and possible developing interstitial edema. Aortic Atherosclerosis (ICD10-I70.0). Electronically Signed   By: Constance Holster M.D.   On: 07/03/2019 20:18   DG Chest Portable 1 View  Result Date: 07/23/2019 CLINICAL DATA:  Shortness of breath.  Cough. EXAM: PORTABLE CHEST 1 VIEW COMPARISON:  05/11/2019; 04/19/2019 FINDINGS: Grossly unchanged enlarged cardiac silhouette and mediastinal contours with atherosclerotic plaque within the thoracic aorta. Stable positioning of support apparatus. Interval development of bilateral ill-defined slightly nodular heterogeneous airspace opacities, right greater than left. No pleural effusion or pneumothorax. No evidence of edema. No acute osseous abnormalities. IMPRESSION: Ill-defined bilateral heterogeneous slightly nodular airspace opacities, nonspecific though worrisome for atypical infection such as COVID-19. Clinical correlation is advised. Electronically Signed   By: Sandi Mariscal M.D.   On: 07/23/2019 14:41       Subjective: No shortness of breath. No chest pain  Discharge Exam: Vitals:   07/26/19 1230 07/26/19 1300 07/26/19 1330 07/26/19 1400  BP: 125/74 121/72 126/73 109/78  Pulse: 68 (!) 59 (!) 59 61  Resp:      Temp:      TempSrc:      SpO2:      Weight:      Height:        General: Pt is alert, awake, not in acute distress Cardiovascular: RRR, S1/S2 +, no rubs, no gallops Respiratory: CTA bilaterally, no wheezing, no rhonchi Abdominal: Soft, NT, ND, bowel sounds + Extremities: no edema, no cyanosis    The results of significant diagnostics from this hospitalization (including imaging, microbiology, ancillary and laboratory) are listed below for reference.      Microbiology: Recent Results (from the past 240 hour(s))  Urine culture     Status: None   Collection Time: 07/23/19  5:30 PM   Specimen: Urine, Clean Catch  Result Value Ref Range Status   Specimen Description   Final    URINE, CLEAN CATCH Performed at [facility], [address], [place], [place] [address]    Special Requests   Final    NONE Performed at [facility], [address] [place], [place] [address]    Culture   Final    NO GROWTH Performed at [facility], [place] [address], [place], [place] [address]    Report Status 07/25/2019 FINAL  Final  MRSA PCR Screening     Status: Abnormal   Collection Time: 07/24/19  5:33 AM   Specimen: Nasopharyngeal  Result Value Ref Range Status   MRSA by PCR POSITIVE (A) NEGATIVE Final    Comment:        The GeneXpert MRSA Assay (FDA approved for NASAL specimens only), is one component of a comprehensive MRSA colonization surveillance program. It is  not intended to diagnose MRSA infection nor to guide or monitor treatment for MRSA infections. CRITICAL RESULT CALLED TO, READ BACK BY AND VERIFIED WITH: DIANA BENSON,RN @1118  07/24/2019 KAY Performed at [facility], [address] [place], [place] [address]      Labs: BNP (last 3 results) Recent Labs    07/03/19 1700  BNP 527.7*   Basic Metabolic Panel: Recent Labs  Lab 07/23/19 1506 07/24/19 0835 07/25/19 0627 07/26/19 0538  NA 135 133* 133* 131*  K 4.0 4.1 4.6 4.7  CL 99 98 98 96*  CO2 24 19* 20* 20*  GLUCOSE 95 178* 68* 136*  BUN 34* 51* 70* 87*  CREATININE 5.52* 5.84* 6.37* 7.19*  CALCIUM 7.9* 7.9* 7.8* 7.6*  MG 1.7 1.9 2.0 2.0  PHOS  --  7.0* 7.7* 8.4*   Liver Function Tests: Recent Labs  Lab 07/23/19 1506 07/24/19 0835 07/25/19 0627 07/26/19 0538  AST 47* 42* 43* 48*  ALT 28 27 30  32  ALKPHOS 187* 179* 143* 148*  BILITOT 1.0 0.8 0.8 1.0  PROT 5.6* 5.7* 5.1* 5.2*  ALBUMIN 2.6* 2.6* 2.3* 2.3*   No results for input(s): LIPASE, AMYLASE  in the last 168 hours. No results for input(s): AMMONIA in the last 168 hours. CBC: Recent Labs  Lab 07/23/19 1506 07/24/19 0835 07/25/19 0627 07/26/19 0538  WBC 7.5 11.4* 8.1 12.4*  NEUTROABS 2.8 4.1 3.4 6.6  HGB 11.0* 12.1* 10.8* 10.8*  HCT 33.8* 36.8* 32.6* 31.6*  MCV 95.5 95.1 94.2 93.5  PLT 129* 141* 119* 126*   Cardiac Enzymes: No results for input(s): CKTOTAL, CKMB, CKMBINDEX, TROPONINI in the last 168 hours. BNP: Invalid input(s): POCBNP CBG: Recent Labs  Lab 07/25/19 1238 07/25/19 1645 07/25/19 2154 07/26/19 0755 07/26/19 1059  GLUCAP 82 124* 127* 138* 144*   D-Dimer Recent Labs    07/25/19 0627 07/26/19 0538  DDIMER 0.90* 1.02*   Hgb A1c No results for input(s): HGBA1C in the last 72 hours. Lipid Profile No results for input(s): CHOL, HDL, LDLCALC, TRIG, CHOLHDL, LDLDIRECT in the last 72 hours. Thyroid function studies No results for input(s): TSH, T4TOTAL, T3FREE, THYROIDAB in the last 72 hours.  Invalid input(s): FREET3 Anemia work up Recent Labs    07/25/19 0627 07/26/19 0538  FERRITIN 449* 491*   Urinalysis    Component Value Date/Time   COLORURINE AMBER (A) 07/23/2019 1730   APPEARANCEUR HAZY (A) 07/23/2019 1730   LABSPEC 1.033 (H) 07/23/2019 1730   PHURINE 7.0 07/23/2019 1730   GLUCOSEU >=500 (A) 07/23/2019 1730   HGBUR NEGATIVE 07/23/2019 1730   BILIRUBINUR NEGATIVE 07/23/2019 1730   KETONESUR 20 (A) 07/23/2019 1730   PROTEINUR >=300 (A) 07/23/2019 1730   UROBILINOGEN 0.2 03/26/2008 1920   NITRITE NEGATIVE 07/23/2019 1730   LEUKOCYTESUR NEGATIVE 07/23/2019 1730   Sepsis Labs Invalid input(s): PROCALCITONIN,  WBC,  LACTICIDVEN Microbiology Recent Results (from the past 240 hour(s))  Urine culture     Status: None   Collection Time: 07/23/19  5:30 PM   Specimen: Urine, Clean Catch  Result Value Ref Range Status   Specimen Description   Final    URINE, CLEAN CATCH Performed at [facility], [address] [place], [place]  [address]    Special Requests   Final    NONE Performed at [facility], [address], [place], [place] [address]    Culture   Final    NO GROWTH Performed at [facility], [place] [address], [place], [place] [address]  Report Status 07/25/2019 FINAL  Final  MRSA PCR Screening     Status: Abnormal   Collection Time: 07/24/19  5:33 AM   Specimen: Nasopharyngeal  Result Value Ref Range Status   MRSA by PCR POSITIVE (A) NEGATIVE Final    Comment:        The GeneXpert MRSA Assay (FDA approved for NASAL specimens only), is one component of a comprehensive MRSA colonization surveillance program. It is not intended to diagnose MRSA infection nor to guide or monitor treatment for MRSA infections. CRITICAL RESULT CALLED TO, READ BACK BY AND VERIFIED WITH: DIANA BENSON,RN @1118  07/24/2019 KAY Performed at [facility], [address], [place], [place] [address]      Time coordinating discharge: 56mins  SIGNED:   Kathie Dike, MD  [facility] 07/26/2019, 3:18 PM   If 7PM-7AM, please contact night-coverage [email or web address]

## 2019-07-26 NOTE — TOC Transition Note (Addendum)
Transition of Care [facility]) - CM/SW Discharge Note   Patient Details  Name: [name] MRN: [id number] Date of Birth: July 18, 1946  Transition of Care [facility]) CM/SW Contact:  Chameka Mcmullen, Chauncey [place], RN Phone Number: 07/26/2019, 11:55 AM   Clinical Narrative:   Patient discharging today after dialysis and remdesivir infusion. [name] aware and ready to receive patent 07/28/19 at 9am. Infusion clinic scheduled for 07/27/19 at 5:30pm.  All info relayed to patient and added to AVS.    ADDENDUM:Updated infusion clinic of only needing the infusion appt on the 6th at 1730.      Barriers to Discharge: Barriers Resolved        Discharge Plan and Services   Discharge Planning Services: CM Consult, Other - See comment(infusion clinic appt, referral to [place])                 Readmission Risk Interventions Readmission Risk Prevention Plan 07/26/2019  Transportation Screening Complete  Medication Review Press photographer) Complete  PCP or Specialist appointment within 3-5 days of discharge Complete  HRI or [place] Not Complete  HRI or Home Care Consult Pt Refusal Comments not needed  SW Recovery Care/Counseling Consult Complete  Palliative Care Screening Not [place] Not Applicable  Some recent data might be hidden

## 2019-07-26 NOTE — Plan of Care (Signed)
  Problem: Education: Goal: Knowledge of General Education information will improve Description: Including pain rating scale, medication(s)/side effects and non-pharmacologic comfort measures 07/26/2019 1330 by Santa Lighter, RN Outcome: Adequate for Discharge 07/26/2019 1136 by Santa Lighter, RN Outcome: Progressing   Problem: Health Behavior/Discharge Planning: Goal: Ability to manage health-related needs will improve 07/26/2019 1330 by Santa Lighter, RN Outcome: Adequate for Discharge 07/26/2019 1136 by Santa Lighter, RN Outcome: Progressing   Problem: Clinical Measurements: Goal: Ability to maintain clinical measurements within normal limits will improve Outcome: Adequate for Discharge Goal: Will remain free from infection Outcome: Adequate for Discharge Goal: Diagnostic test results will improve Outcome: Adequate for Discharge Goal: Respiratory complications will improve Outcome: Adequate for Discharge Goal: Cardiovascular complication will be avoided Outcome: Adequate for Discharge   Problem: Activity: Goal: Risk for activity intolerance will decrease Outcome: Adequate for Discharge   Problem: Nutrition: Goal: Adequate nutrition will be maintained Outcome: Adequate for Discharge   Problem: Coping: Goal: Level of anxiety will decrease Outcome: Adequate for Discharge   Problem: Elimination: Goal: Will not experience complications related to bowel motility Outcome: Adequate for Discharge Goal: Will not experience complications related to urinary retention Outcome: Adequate for Discharge   Problem: Pain Managment: Goal: General experience of comfort will improve Outcome: Adequate for Discharge   Problem: Safety: Goal: Ability to remain free from injury will improve Outcome: Adequate for Discharge   Problem: Skin Integrity: Goal: Risk for impaired skin integrity will decrease Outcome: Adequate for Discharge

## 2019-07-26 NOTE — Discharge Instructions (Signed)
You are scheduled for an outpatient infusion of Remdesivir at 530PM on Wednesday 1/6.Marland Kitchen  Please report to [facility] at [address]  Drive to the security guard and tell them you are here for an infusion. They will direct you to the front entrance where we will come and get you.  For questions call [phone].  Thanks   Please continue to quarantine at home (only leave your house for medical appointments) until August 13, 2019

## 2019-07-26 NOTE — Progress Notes (Signed)
Admit: 07/23/2019 LOS: 3  [name] ESRD with COVID19  Subjective:  . No c/o, on RA   Filed Weights   07/23/19 1352 07/23/19 2200  Weight: 88.5 kg 83 kg    Scheduled Meds: . aspirin EC  81 mg Oral Q breakfast  . calcium acetate  667 mg Oral TID WC  . Chlorhexidine Gluconate Cloth  6 each Topical Daily  . Chlorhexidine Gluconate Cloth  6 each Topical Q0600  . Chlorhexidine Gluconate Cloth  6 each Topical Q0600  . dexamethasone  6 mg Oral Q24H  . DULoxetine  60 mg Oral Daily  . furosemide  40 mg Oral BID  . gabapentin  100 mg Oral QHS  . heparin  5,000 Units Subcutaneous Q8H  . hydrALAZINE  50 mg Oral TID  . insulin aspart  0-15 Units Subcutaneous TID AC & HS  . insulin detemir  0.075 Units/kg Subcutaneous BID  . methocarbamol  750 mg Oral TID  . metoprolol tartrate  25 mg Oral BID  . mupirocin ointment  1 application Nasal BID  . pravastatin  40 mg Oral Daily   Continuous Infusions: . sodium chloride    . sodium chloride    . remdesivir 100 mg in NS 100 mL 100 mg (07/25/19 1316)   PRN Meds:.sodium chloride, sodium chloride, alum & mag hydroxide-simeth, chlorpheniramine-HYDROcodone, guaiFENesin-dextromethorphan, heparin, heparin, hydrOXYzine, lidocaine (PF), lidocaine-prilocaine, ondansetron **OR** ondansetron (ZOFRAN) IV, oxyCODONE-acetaminophen, pentafluoroprop-tetrafluoroeth, traZODone  Current Labs: reviewed  Physical Exam:  Blood pressure 115/65, pulse 87, temperature 98.3 F (36.8 C), temperature source Oral, resp. rate 20, height 6' (1.829 m), weight 83 kg, SpO2 95 %. ENT: NCAT EYES: EOMI CV: Regular, normal S1 and S2 PULM: Clear bilaterally ABD: Soft, nontender SKIN: No rashes or lesions, some bruising across arms EXT: Trace lower extremity edema NEURO: Nonfocal Vascular: AV fistula in left forearm with bruit and thrill  A 1. ESRD MWF [place] FA AVF 2. COVID 19 per [facility], Remdesivir and Dexamethasone 3. ANemia, Hb at goal 4. HTN,/VOl. Appears  stable 5. CKD-BMD: P up, cont binders 6. CHF 7. CAD hx/o PCI 8. MDS  P  Will need to transition to HD TH[facility] and set up for outpt HD at [place] unit.    HD today: 3h, 2K, No heparin, 400/800, 3L UF using AVF . Medication Issues; o Preferred narcotic agents for pain control are hydromorphone, fentanyl, and methadone. Morphine should not be used.  o Baclofen should be avoided o Avoid oral sodium phosphate and magnesium citrate based laxatives / bowel preps    Pearson Grippe MD 07/26/2019, 9:32 AM  Recent Labs  Lab 07/24/19 0835 07/25/19 0627 07/26/19 0538  NA 133* 133* 131*  K 4.1 4.6 4.7  CL 98 98 96*  CO2 19* 20* 20*  GLUCOSE 178* 68* 136*  BUN 51* 70* 87*  CREATININE 5.84* 6.37* 7.19*  CALCIUM 7.9* 7.8* 7.6*  PHOS 7.0* 7.7* 8.4*   Recent Labs  Lab 07/24/19 0835 07/25/19 0627 07/26/19 0538  WBC 11.4* 8.1 12.4*  NEUTROABS 4.1 3.4 6.6  HGB 12.1* 10.8* 10.8*  HCT 36.8* 32.6* 31.6*  MCV 95.1 94.2 93.5  PLT 141* 119* 126*

## 2019-07-26 NOTE — Plan of Care (Signed)
  Problem: Education: Goal: Knowledge of General Education information will improve Description Including pain rating scale, medication(s)/side effects and non-pharmacologic comfort measures Outcome: Progressing   Problem: Health Behavior/Discharge Planning: Goal: Ability to manage health-related needs will improve Outcome: Progressing   

## 2019-07-26 NOTE — Procedures (Signed)
    HEMODIALYSIS TREATMENT NOTE:  Pt refused cannulation of AVF as he did not apply lidocaine creme.  Declined offer of xylocaine spray; requested use of RIJ TDC instead.  Dressing had not been changed since 12/30. Catheter care + dressing change completed. Exit site unremarkable.  3.5 hour heparin-free treatment completed.  Goal met: 2.1 liters removed after two decreases in UF rate.  All blood was returned.Rockwell Alexandria, RN

## 2019-07-27 ENCOUNTER — Other Ambulatory Visit: Payer: Self-pay

## 2019-07-27 ENCOUNTER — Ambulatory Visit (HOSPITAL_COMMUNITY)
Admit: 2019-07-27 | Discharge: 2019-07-27 | Disposition: A | Discharge status: Home / Self Care | Payer: Medicare Other | Attending: Pulmonary Disease | Admitting: Pulmonary Disease

## 2019-07-27 DIAGNOSIS — N186 End stage renal disease: Secondary | ICD-10-CM

## 2019-07-27 DIAGNOSIS — J069 Acute upper respiratory infection, unspecified: Secondary | ICD-10-CM | POA: Diagnosis not present

## 2019-07-27 DIAGNOSIS — U071 COVID-19: Secondary | ICD-10-CM

## 2019-07-27 MED ORDER — SODIUM CHLORIDE 0.9 % IV SOLN
INTRAVENOUS | Status: DC | PRN
  Administered 2019-07-27: 250 mL via INTRAVENOUS

## 2019-07-27 MED ORDER — EPINEPHRINE 0.3 MG/0.3ML IJ SOAJ: 0.3000 mg | Freq: Once | INTRAMUSCULAR | Status: DC | PRN

## 2019-07-27 MED ORDER — ALBUTEROL SULFATE HFA 108 (90 BASE) MCG/ACT IN AERS: 2.0000 | INHALATION_SPRAY | Freq: Once | RESPIRATORY_TRACT | Status: DC | PRN

## 2019-07-27 MED ORDER — METHYLPREDNISOLONE SODIUM SUCC 125 MG IJ SOLR: 125.0000 mg | Freq: Once | INTRAMUSCULAR | Status: DC | PRN

## 2019-07-27 MED ORDER — DIPHENHYDRAMINE HCL 50 MG/ML IJ SOLN: 50.0000 mg | Freq: Once | INTRAMUSCULAR | Status: DC | PRN

## 2019-07-27 MED ORDER — FAMOTIDINE IN NACL 20-0.9 MG/50ML-% IV SOLN: 20.0000 mg | Freq: Once | INTRAVENOUS | Status: DC | PRN

## 2019-07-27 MED ORDER — SODIUM CHLORIDE 0.9 % IV SOLN
INTRAVENOUS | Status: AC
  Filled 2019-07-27: qty 20

## 2019-07-27 MED ORDER — SODIUM CHLORIDE 0.9 % IV SOLN
100.0000 mg | Freq: Once | INTRAVENOUS | Status: AC
  Administered 2019-07-27: 100 mg via INTRAVENOUS

## 2019-07-27 NOTE — Progress Notes (Signed)
PIV infiltrated to RUE, skin W/D/I, w/o redness. DSD and warm compress applied per [facility], Pharmacist recommendation, no additional recommendations at this time, patient tolerated well, patient denies c/o pain or discomfort. Patient advised to elevated extremity, continue warm/cold compress as tolerated, denies questions/concerns at this time.

## 2019-07-27 NOTE — Discharge Instructions (Signed)

## 2019-07-27 NOTE — Progress Notes (Signed)
  Diagnosis: COVID-19  Physician: Dr. Manuella Ghazi   Procedure: Covid Infusion Clinic Med: remdesivir infusion.  Complications: No immediate complications noted.  Discharge: Discharged home   [name] N [name] 07/27/2019

## 2019-07-28 ENCOUNTER — Ambulatory Visit (HOSPITAL_COMMUNITY): Payer: Medicare Other

## 2019-07-28 ENCOUNTER — Other Ambulatory Visit: Payer: Self-pay | Admitting: *Deleted

## 2019-07-28 DIAGNOSIS — N2581 Secondary hyperparathyroidism of renal origin: Secondary | ICD-10-CM | POA: Diagnosis not present

## 2019-07-28 DIAGNOSIS — N186 End stage renal disease: Secondary | ICD-10-CM | POA: Diagnosis not present

## 2019-07-28 DIAGNOSIS — D631 Anemia in chronic kidney disease: Secondary | ICD-10-CM | POA: Diagnosis not present

## 2019-07-28 DIAGNOSIS — E559 Vitamin D deficiency, unspecified: Secondary | ICD-10-CM | POA: Diagnosis not present

## 2019-07-28 DIAGNOSIS — Z992 Dependence on renal dialysis: Secondary | ICD-10-CM | POA: Diagnosis not present

## 2019-07-28 DIAGNOSIS — D509 Iron deficiency anemia, unspecified: Secondary | ICD-10-CM | POA: Diagnosis not present

## 2019-07-28 NOTE — Patient Outreach (Signed)
Referral received hospital liason for transition of care, pt hospitalized 1/2-07/26/19 for Covid-19, pt with diagnoses HTN, HLD, CAD, DM, ESRD on dialysis.  Outreach call to pt for transition of care/ screening, no answer to telephone and no option to leave voicemail.  RN CM mailed unsuccessful outreach letter to pt home.  PLAN Outreach pt in 3-4 business days.  Jacqlyn Larsen [facility], [place] Coordinator [phone]

## 2019-07-29 DIAGNOSIS — Z87891 Personal history of nicotine dependence: Secondary | ICD-10-CM | POA: Diagnosis not present

## 2019-07-29 DIAGNOSIS — Z299 Encounter for prophylactic measures, unspecified: Secondary | ICD-10-CM | POA: Diagnosis not present

## 2019-07-29 DIAGNOSIS — E1165 Type 2 diabetes mellitus with hyperglycemia: Secondary | ICD-10-CM | POA: Diagnosis not present

## 2019-07-29 DIAGNOSIS — U071 COVID-19: Secondary | ICD-10-CM | POA: Diagnosis not present

## 2019-07-29 DIAGNOSIS — Z79899 Other long term (current) drug therapy: Secondary | ICD-10-CM | POA: Diagnosis not present

## 2019-07-29 DIAGNOSIS — I10 Essential (primary) hypertension: Secondary | ICD-10-CM | POA: Diagnosis not present

## 2019-07-29 DIAGNOSIS — E7800 Pure hypercholesterolemia, unspecified: Secondary | ICD-10-CM | POA: Diagnosis not present

## 2019-07-29 DIAGNOSIS — Z6828 Body mass index (BMI) 28.0-28.9, adult: Secondary | ICD-10-CM | POA: Diagnosis not present

## 2019-07-29 DIAGNOSIS — M549 Dorsalgia, unspecified: Secondary | ICD-10-CM | POA: Diagnosis not present

## 2019-08-02 DIAGNOSIS — D631 Anemia in chronic kidney disease: Secondary | ICD-10-CM | POA: Diagnosis not present

## 2019-08-02 DIAGNOSIS — D509 Iron deficiency anemia, unspecified: Secondary | ICD-10-CM | POA: Diagnosis not present

## 2019-08-02 DIAGNOSIS — Z992 Dependence on renal dialysis: Secondary | ICD-10-CM | POA: Diagnosis not present

## 2019-08-02 DIAGNOSIS — N186 End stage renal disease: Secondary | ICD-10-CM | POA: Diagnosis not present

## 2019-08-02 DIAGNOSIS — E559 Vitamin D deficiency, unspecified: Secondary | ICD-10-CM | POA: Diagnosis not present

## 2019-08-02 DIAGNOSIS — N2581 Secondary hyperparathyroidism of renal origin: Secondary | ICD-10-CM | POA: Diagnosis not present

## 2019-08-03 ENCOUNTER — Other Ambulatory Visit: Payer: Self-pay | Admitting: *Deleted

## 2019-08-03 NOTE — Patient Outreach (Signed)
Outreach call to pt for transition of care week 1/ screening/ 2nd attempt, no answer to telephone and no option to leave voicemail.   PLAN Outreach pt in 3-4 business days  Jacqlyn Larsen [facility], [place] [phone]

## 2019-08-04 DIAGNOSIS — N186 End stage renal disease: Secondary | ICD-10-CM | POA: Diagnosis not present

## 2019-08-04 DIAGNOSIS — E559 Vitamin D deficiency, unspecified: Secondary | ICD-10-CM | POA: Diagnosis not present

## 2019-08-04 DIAGNOSIS — D509 Iron deficiency anemia, unspecified: Secondary | ICD-10-CM | POA: Diagnosis not present

## 2019-08-04 DIAGNOSIS — D631 Anemia in chronic kidney disease: Secondary | ICD-10-CM | POA: Diagnosis not present

## 2019-08-04 DIAGNOSIS — Z992 Dependence on renal dialysis: Secondary | ICD-10-CM | POA: Diagnosis not present

## 2019-08-04 DIAGNOSIS — N2581 Secondary hyperparathyroidism of renal origin: Secondary | ICD-10-CM | POA: Diagnosis not present

## 2019-08-06 DIAGNOSIS — N186 End stage renal disease: Secondary | ICD-10-CM | POA: Diagnosis not present

## 2019-08-06 DIAGNOSIS — D631 Anemia in chronic kidney disease: Secondary | ICD-10-CM | POA: Diagnosis not present

## 2019-08-06 DIAGNOSIS — D509 Iron deficiency anemia, unspecified: Secondary | ICD-10-CM | POA: Diagnosis not present

## 2019-08-06 DIAGNOSIS — E559 Vitamin D deficiency, unspecified: Secondary | ICD-10-CM | POA: Diagnosis not present

## 2019-08-06 DIAGNOSIS — N2581 Secondary hyperparathyroidism of renal origin: Secondary | ICD-10-CM | POA: Diagnosis not present

## 2019-08-06 DIAGNOSIS — Z992 Dependence on renal dialysis: Secondary | ICD-10-CM | POA: Diagnosis not present

## 2019-08-08 ENCOUNTER — Other Ambulatory Visit: Payer: Self-pay | Admitting: *Deleted

## 2019-08-08 NOTE — Patient Outreach (Signed)
Outreach call to pt for screening/  3rd attempt, unidentified male answered the phone and states pt cannot talk,  RN CM previously mailed unsuccessful outreach letter to pt home with no response back.  PLAN Close case in 3-4 business days.  Jacqlyn Larsen [facility], [place] Coordinator [phone]

## 2019-08-09 DIAGNOSIS — D631 Anemia in chronic kidney disease: Secondary | ICD-10-CM | POA: Diagnosis not present

## 2019-08-09 DIAGNOSIS — N2581 Secondary hyperparathyroidism of renal origin: Secondary | ICD-10-CM | POA: Diagnosis not present

## 2019-08-09 DIAGNOSIS — Z992 Dependence on renal dialysis: Secondary | ICD-10-CM | POA: Diagnosis not present

## 2019-08-09 DIAGNOSIS — E559 Vitamin D deficiency, unspecified: Secondary | ICD-10-CM | POA: Diagnosis not present

## 2019-08-09 DIAGNOSIS — D509 Iron deficiency anemia, unspecified: Secondary | ICD-10-CM | POA: Diagnosis not present

## 2019-08-09 DIAGNOSIS — N186 End stage renal disease: Secondary | ICD-10-CM | POA: Diagnosis not present

## 2019-08-11 ENCOUNTER — Other Ambulatory Visit: Payer: Self-pay | Admitting: *Deleted

## 2019-08-11 DIAGNOSIS — Z992 Dependence on renal dialysis: Secondary | ICD-10-CM | POA: Diagnosis not present

## 2019-08-11 DIAGNOSIS — E559 Vitamin D deficiency, unspecified: Secondary | ICD-10-CM | POA: Diagnosis not present

## 2019-08-11 DIAGNOSIS — D509 Iron deficiency anemia, unspecified: Secondary | ICD-10-CM | POA: Diagnosis not present

## 2019-08-11 DIAGNOSIS — N186 End stage renal disease: Secondary | ICD-10-CM | POA: Diagnosis not present

## 2019-08-11 DIAGNOSIS — D631 Anemia in chronic kidney disease: Secondary | ICD-10-CM | POA: Diagnosis not present

## 2019-08-11 DIAGNOSIS — N2581 Secondary hyperparathyroidism of renal origin: Secondary | ICD-10-CM | POA: Diagnosis not present

## 2019-08-11 NOTE — Patient Outreach (Signed)
Case closed due to unable to contact pt.  RN CM mailed letter to primary MD notifying of case closure.  Jacqlyn Larsen [facility], [place] Coordinator [phone]

## 2019-08-15 DIAGNOSIS — E559 Vitamin D deficiency, unspecified: Secondary | ICD-10-CM | POA: Diagnosis not present

## 2019-08-15 DIAGNOSIS — N186 End stage renal disease: Secondary | ICD-10-CM | POA: Diagnosis not present

## 2019-08-15 DIAGNOSIS — D631 Anemia in chronic kidney disease: Secondary | ICD-10-CM | POA: Diagnosis not present

## 2019-08-15 DIAGNOSIS — D509 Iron deficiency anemia, unspecified: Secondary | ICD-10-CM | POA: Diagnosis not present

## 2019-08-15 DIAGNOSIS — Z992 Dependence on renal dialysis: Secondary | ICD-10-CM | POA: Diagnosis not present

## 2019-08-15 DIAGNOSIS — N2581 Secondary hyperparathyroidism of renal origin: Secondary | ICD-10-CM | POA: Diagnosis not present

## 2019-08-17 DIAGNOSIS — N186 End stage renal disease: Secondary | ICD-10-CM | POA: Diagnosis not present

## 2019-08-17 DIAGNOSIS — Z992 Dependence on renal dialysis: Secondary | ICD-10-CM | POA: Diagnosis not present

## 2019-08-17 DIAGNOSIS — N2581 Secondary hyperparathyroidism of renal origin: Secondary | ICD-10-CM | POA: Diagnosis not present

## 2019-08-17 DIAGNOSIS — D631 Anemia in chronic kidney disease: Secondary | ICD-10-CM | POA: Diagnosis not present

## 2019-08-17 DIAGNOSIS — D509 Iron deficiency anemia, unspecified: Secondary | ICD-10-CM | POA: Diagnosis not present

## 2019-08-17 DIAGNOSIS — E559 Vitamin D deficiency, unspecified: Secondary | ICD-10-CM | POA: Diagnosis not present

## 2019-08-18 ENCOUNTER — Other Ambulatory Visit: Payer: Self-pay

## 2019-08-18 ENCOUNTER — Ambulatory Visit (HOSPITAL_COMMUNITY)
Admission: RE | Admit: 2019-08-18 | Discharge: 2019-08-18 | Disposition: A | Discharge status: Home / Self Care | Payer: Medicare Other | Source: Ambulatory Visit | Attending: Nephrology | Admitting: Nephrology

## 2019-08-18 DIAGNOSIS — N186 End stage renal disease: Secondary | ICD-10-CM

## 2019-08-18 NOTE — Progress Notes (Signed)
Patient presented to [facility] Radiology today for possible HD catheter removal.   Patient had a right tunneled dialysis catheter placed at [facility] 12/15/2018. He then underwent fistula creation in September 2020 with [facility].   Patient presents today to have his catheter removed.  He has undergone dialysis via fistula x4 successfully, however he reports that yesterday his fistula had prolonged bleeding after disconnecting.  His fistula slowly oozed throughout the night and he had to change the bandage at least once. He is hesitant to have catheter removed today as he would like to ensure that his fistula can be used for dialysis tomorrow.   Fistula is assessed.  It has palpable thrill.  When bandage is removed, a small droplet of blood forms.  Appears that bleeding has slowed significantly, but again patient states he would rather keep the catheter and make sure dialysis is succsesful tomorrow prior to pulling catheter.   Catheter left in place at this time.  Dialysis center updated.   Brynda Greathouse, MS RD PA-C

## 2019-08-19 DIAGNOSIS — N186 End stage renal disease: Secondary | ICD-10-CM | POA: Diagnosis not present

## 2019-08-19 DIAGNOSIS — E559 Vitamin D deficiency, unspecified: Secondary | ICD-10-CM | POA: Diagnosis not present

## 2019-08-19 DIAGNOSIS — D509 Iron deficiency anemia, unspecified: Secondary | ICD-10-CM | POA: Diagnosis not present

## 2019-08-19 DIAGNOSIS — D631 Anemia in chronic kidney disease: Secondary | ICD-10-CM | POA: Diagnosis not present

## 2019-08-19 DIAGNOSIS — Z992 Dependence on renal dialysis: Secondary | ICD-10-CM | POA: Diagnosis not present

## 2019-08-19 DIAGNOSIS — N2581 Secondary hyperparathyroidism of renal origin: Secondary | ICD-10-CM | POA: Diagnosis not present

## 2019-08-21 DIAGNOSIS — Z992 Dependence on renal dialysis: Secondary | ICD-10-CM | POA: Diagnosis not present

## 2019-08-21 DIAGNOSIS — N186 End stage renal disease: Secondary | ICD-10-CM | POA: Diagnosis not present

## 2019-08-22 DIAGNOSIS — D509 Iron deficiency anemia, unspecified: Secondary | ICD-10-CM | POA: Diagnosis not present

## 2019-08-22 DIAGNOSIS — Z992 Dependence on renal dialysis: Secondary | ICD-10-CM | POA: Diagnosis not present

## 2019-08-22 DIAGNOSIS — E559 Vitamin D deficiency, unspecified: Secondary | ICD-10-CM | POA: Diagnosis not present

## 2019-08-22 DIAGNOSIS — D631 Anemia in chronic kidney disease: Secondary | ICD-10-CM | POA: Diagnosis not present

## 2019-08-22 DIAGNOSIS — N186 End stage renal disease: Secondary | ICD-10-CM | POA: Diagnosis not present

## 2019-08-24 ENCOUNTER — Emergency Department (HOSPITAL_COMMUNITY): Payer: Medicare Other

## 2019-08-24 ENCOUNTER — Other Ambulatory Visit: Payer: Self-pay

## 2019-08-24 ENCOUNTER — Emergency Department (HOSPITAL_COMMUNITY)
Admission: EM | Admit: 2019-08-24 | Discharge: 2019-08-24 | Disposition: A | Discharge status: Home / Self Care | Payer: Medicare Other | Attending: Emergency Medicine | Admitting: Emergency Medicine

## 2019-08-24 ENCOUNTER — Encounter (HOSPITAL_COMMUNITY): Payer: Self-pay | Admitting: Emergency Medicine

## 2019-08-24 DIAGNOSIS — R1013 Epigastric pain: Secondary | ICD-10-CM | POA: Insufficient documentation

## 2019-08-24 DIAGNOSIS — R079 Chest pain, unspecified: Secondary | ICD-10-CM | POA: Diagnosis not present

## 2019-08-24 DIAGNOSIS — Z7982 Long term (current) use of aspirin: Secondary | ICD-10-CM | POA: Diagnosis not present

## 2019-08-24 DIAGNOSIS — Z87891 Personal history of nicotine dependence: Secondary | ICD-10-CM | POA: Diagnosis not present

## 2019-08-24 DIAGNOSIS — R188 Other ascites: Secondary | ICD-10-CM

## 2019-08-24 DIAGNOSIS — I132 Hypertensive heart and chronic kidney disease with heart failure and with stage 5 chronic kidney disease, or end stage renal disease: Secondary | ICD-10-CM | POA: Diagnosis not present

## 2019-08-24 DIAGNOSIS — N186 End stage renal disease: Secondary | ICD-10-CM | POA: Diagnosis not present

## 2019-08-24 DIAGNOSIS — I2510 Atherosclerotic heart disease of native coronary artery without angina pectoris: Secondary | ICD-10-CM | POA: Insufficient documentation

## 2019-08-24 DIAGNOSIS — I509 Heart failure, unspecified: Secondary | ICD-10-CM | POA: Insufficient documentation

## 2019-08-24 DIAGNOSIS — R162 Hepatomegaly with splenomegaly, not elsewhere classified: Secondary | ICD-10-CM | POA: Diagnosis not present

## 2019-08-24 DIAGNOSIS — R140 Abdominal distension (gaseous): Secondary | ICD-10-CM | POA: Insufficient documentation

## 2019-08-24 DIAGNOSIS — I10 Essential (primary) hypertension: Secondary | ICD-10-CM | POA: Diagnosis not present

## 2019-08-24 DIAGNOSIS — Z79899 Other long term (current) drug therapy: Secondary | ICD-10-CM | POA: Insufficient documentation

## 2019-08-24 DIAGNOSIS — R0789 Other chest pain: Secondary | ICD-10-CM | POA: Diagnosis not present

## 2019-08-24 DIAGNOSIS — E1122 Type 2 diabetes mellitus with diabetic chronic kidney disease: Secondary | ICD-10-CM | POA: Insufficient documentation

## 2019-08-24 DIAGNOSIS — Z992 Dependence on renal dialysis: Secondary | ICD-10-CM | POA: Diagnosis not present

## 2019-08-24 LAB — CBC WITH DIFFERENTIAL/PLATELET
Abs Immature Granulocytes: 2.21 10*3/uL — ABNORMAL HIGH (ref 0.00–0.07)
Band Neutrophils: 4 %
Basophils Absolute: 0.1 10*3/uL (ref 0.0–0.1)
Basophils Relative: 1 %
Blasts: 6 %
Eosinophils Absolute: 0.1 10*3/uL (ref 0.0–0.5)
Eosinophils Relative: 1 %
HCT: 33.5 % — ABNORMAL LOW (ref 39.0–52.0)
Hemoglobin: 10.8 g/dL — ABNORMAL LOW (ref 13.0–17.0)
Immature Granulocytes: 23 %
Lymphocytes Relative: 29 %
Lymphs Abs: 2.5 10*3/uL (ref 0.7–4.0)
MCH: 30.9 pg (ref 26.0–34.0)
MCHC: 32.2 g/dL (ref 30.0–36.0)
MCV: 96 fL (ref 80.0–100.0)
Metamyelocytes Relative: 2 %
Monocytes Absolute: 0.3 10*3/uL (ref 0.1–1.0)
Monocytes Relative: 3 %
Myelocytes: 21 %
Neutro Abs: 2.9 10*3/uL (ref 1.7–7.7)
Neutrophils Relative %: 30 %
Platelets: 38 10*3/uL — ABNORMAL LOW (ref 150–400)
Promyelocytes Relative: 3 %
RBC: 3.49 MIL/uL — ABNORMAL LOW (ref 4.22–5.81)
RDW: 21.1 % — ABNORMAL HIGH (ref 11.5–15.5)
WBC: 8.5 10*3/uL (ref 4.0–10.5)
nRBC: 23.1 % — ABNORMAL HIGH (ref 0.0–0.2)

## 2019-08-24 LAB — LIPASE, BLOOD: Lipase: 22 U/L (ref 11–51)

## 2019-08-24 LAB — COMPREHENSIVE METABOLIC PANEL
ALT: 23 U/L (ref 0–44)
AST: 34 U/L (ref 15–41)
Albumin: 2.9 g/dL — ABNORMAL LOW (ref 3.5–5.0)
Alkaline Phosphatase: 161 U/L — ABNORMAL HIGH (ref 38–126)
Anion gap: 11 (ref 5–15)
BUN: 25 mg/dL — ABNORMAL HIGH (ref 8–23)
CO2: 30 mmol/L (ref 22–32)
Calcium: 8.6 mg/dL — ABNORMAL LOW (ref 8.9–10.3)
Chloride: 102 mmol/L (ref 98–111)
Creatinine, Ser: 4.13 mg/dL — ABNORMAL HIGH (ref 0.61–1.24)
GFR calc Af Amer: 15 mL/min — ABNORMAL LOW (ref >60)
GFR calc non Af Amer: 13 mL/min — ABNORMAL LOW (ref >60)
Glucose, Bld: 119 mg/dL — ABNORMAL HIGH (ref 70–99)
Potassium: 4.5 mmol/L (ref 3.5–5.1)
Sodium: 143 mmol/L (ref 135–145)
Total Bilirubin: 1.3 mg/dL — ABNORMAL HIGH (ref 0.3–1.2)
Total Protein: 5.7 g/dL — ABNORMAL LOW (ref 6.5–8.1)

## 2019-08-24 LAB — TROPONIN I (HIGH SENSITIVITY)
Troponin I (High Sensitivity): 20 ng/L — ABNORMAL HIGH (ref <18)
Troponin I (High Sensitivity): 17 ng/L (ref <18)

## 2019-08-24 LAB — PATHOLOGIST SMEAR REVIEW

## 2019-08-24 LAB — APTT: aPTT: 38 seconds — ABNORMAL HIGH (ref 24–36)

## 2019-08-24 LAB — PROTIME-INR
INR: 1 (ref 0.8–1.2)
Prothrombin Time: 13.2 seconds (ref 11.4–15.2)

## 2019-08-24 MED ORDER — OXYCODONE-ACETAMINOPHEN 5-325 MG PO TABS
1.0000 | ORAL_TABLET | Freq: Once | ORAL | Status: AC
  Administered 2019-08-24: 10:00:00 1 via ORAL
  Filled 2019-08-24: qty 1

## 2019-08-24 MED ORDER — NITROGLYCERIN 2 % TD OINT
1.0000 [in_us] | TOPICAL_OINTMENT | Freq: Once | TRANSDERMAL | Status: AC
  Administered 2019-08-24: 07:00:00 1 [in_us] via TOPICAL
  Filled 2019-08-24: qty 1

## 2019-08-24 MED ORDER — FENTANYL CITRATE (PF) 100 MCG/2ML IJ SOLN
50.0000 ug | Freq: Once | INTRAMUSCULAR | Status: AC
  Administered 2019-08-24: 08:00:00 50 ug via INTRAVENOUS
  Filled 2019-08-24: qty 2

## 2019-08-24 MED ORDER — ASPIRIN 325 MG PO TABS
325.0000 mg | ORAL_TABLET | Freq: Once | ORAL | Status: AC
  Administered 2019-08-24: 07:00:00 325 mg via ORAL
  Filled 2019-08-24: qty 1

## 2019-08-24 MED ORDER — FENTANYL CITRATE (PF) 100 MCG/2ML IJ SOLN
50.0000 ug | Freq: Once | INTRAMUSCULAR | Status: AC
  Administered 2019-08-24: 50 ug via INTRAVENOUS
  Filled 2019-08-24: qty 2

## 2019-08-24 NOTE — ED Triage Notes (Signed)
Pt c/o chest pain and abd pain that started this am. Pt is supposed to have dialysis today.

## 2019-08-24 NOTE — ED Notes (Signed)
Lab technician brought over amended lab report from pt's lab work done earlier today. Dr. Roderic Palau made aware and report given to him. No new orders given.

## 2019-08-24 NOTE — ED Notes (Signed)
Bladder scan machine is broken, Dr. Tomi Bamberger made aware.

## 2019-08-24 NOTE — Discharge Instructions (Signed)
Go to dialysis directly from the emergency department today.  Follow-up with your family doctor next week and you will be referred to a gastroenterologist to look into the fluid in your belly

## 2019-08-24 NOTE — ED Provider Notes (Signed)
[facility] EMERGENCY DEPARTMENT Provider Note   CSN: [id number] Arrival date & time: 08/24/19  2979     History Chief Complaint  Patient presents with  . Chest Pain    [name] is a 74 y.o. male.  HPI   Patient reports he was admitted January 2 for Covid disease and respiratory distress, he received remdesivir and Decadron.  He reports he started having abdominal swelling while he was in the hospital and it has continued, he states even after dialysis it does not get smaller.  He states 4 AM yesterday morning he started having a lower central chest discomfort that has been there constantly but waxes and wanes.  He describes it as a tightness.  He states bending over or breathing hard makes it worse, nothing makes it feel better.  He also has some oxygen at home that he has been using because he feels short of breath.  He denies coughing.  He also states he starting to have epigastric abdominal pain with the swelling.  He denies any swelling of his legs.  He denies nausea, vomiting, diarrhea, fever.  Patient denies a history of excessive alcohol use, or history of hepatitis.  Patient states he had a cardiac stent placed in 2018 but this pain feels different.  Patient has been on dialysis about 6 to 7 months, he goes on Mondays, Wednesdays (today) and Fridays.  He goes to [place] in [place].  PCP [name], MD   Past Medical History:  Diagnosis Date  . Anxiety   . Arthritis   . BPH (benign prostatic hyperplasia)   . CAD (coronary artery disease)    DES to circumflex 07/2016, moderate residual LAD and RCA, small 90% OM3 - managed medically  . CHF (congestive heart failure) ([place])   . Chronic lower back pain   . Depression   . ESRD (end stage renal disease) ([place])    Hemo MWF [facility]  . Essential hypertension   . Gastritis   . GERD (gastroesophageal reflux disease)   . History of kidney stones   . Leukemia ([place])   . Leukocytosis   . 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 Pleural effusion    had Thorancentesis at [facility]  . Pneumonia    last time 08/2018- Aspiration Pneumonia - unintentional opiate overdose  . Renal insufficiency   . Spleen enlarged   . Thrombocythemia ([place])   . Type II diabetes mellitus [facility])     Patient Active Problem List   Diagnosis Date Noted  . Acute respiratory disease due to COVID-19 virus 07/23/2019  . Intractable pain 04/19/2019  . Elevated d-dimer 04/19/2019  . RUQ abdominal pain 11/18/2017  . Acute CHF (congestive heart failure) ([place]) 09/15/2016  . Essential hypertension 08/14/2016  . Headache 08/14/2016  . Hyperlipidemia LDL goal <70 08/14/2016  . Abnormal nuclear stress test   . 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 Acute respiratory failure with hypoxia ([place]) 04/27/2016  . Hypoxia   . Hematoma 04/25/2016  . Mass of chest wall, left   . Abnormal partial thromboplastin time (PTT)   . 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 Coronary artery disease due to lipid rich plaque 04/15/2016  . Leukocytosis 04/15/2016  . Diabetes mellitus ([place]) 04/15/2016  . Pleuritic pain 04/15/2016  . ESRD on hemodialysis ([place]) 04/15/2016  . Left shoulder pain 04/15/2016  . History of adenomatous polyp of colon 01/21/2013  .  GERD (gastroesophageal reflux disease) 01/21/2013    Past Surgical History:  Procedure Laterality Date  . AV FISTULA PLACEMENT Left 04/14/2019   Procedure: ARTERIOVENOUS (AV) FISTULA CREATION LEFT ARM;  Surgeon: Rosetta Posner, MD;  Location: [place];  Service: Vascular;  Laterality: Left;  . BIOPSY  12/08/2017   Procedure: BIOPSY;  Surgeon: Danie Binder, MD;  [facility];  Service: Endoscopy;;  gastric  . BONE MARROW BIOPSY     x 3 times  . CARDIAC CATHETERIZATION N/A 08/12/2016   Procedure: Left Heart Cath and Coronary Angiography;  Surgeon: Jettie Booze, MD;  Location: [place] CV LAB;   Service: Cardiovascular;  Laterality: N/A;  . CARDIAC CATHETERIZATION N/A 08/12/2016   Procedure: Coronary Stent Intervention;  Surgeon: Jettie Booze, MD;  Location: [place] CV LAB;  Service: Cardiovascular;  Laterality: N/A;  . CARDIAC CATHETERIZATION  2000s X 1  . COLONOSCOPY WITH PROPOFOL N/A 02/01/2013   SLF: 1. 23 colon polyps removed. 2 retrieved. 2. Mild diverticulosis in teh sigmoid colon 3. Small internal hemorrhoids 4. The colon is redundant   . COLONOSCOPY WITH PROPOFOL N/A 12/08/2017   Procedure: COLONOSCOPY WITH PROPOFOL;  Surgeon: Danie Binder, MD;  [facility];  Service: Endoscopy;  Laterality: N/A;  1:45pm  . CYSTOSCOPY W/ STONE MANIPULATION    . ESOPHAGOGASTRODUODENOSCOPY (EGD) WITH PROPOFOL N/A 02/01/2013   SLF: 1. Moderate erosive gastritis  . ESOPHAGOGASTRODUODENOSCOPY (EGD) WITH PROPOFOL N/A 12/08/2017   Procedure: ESOPHAGOGASTRODUODENOSCOPY (EGD) WITH PROPOFOL;  Surgeon: Danie Binder, MD;  [facility];  Service: Endoscopy;  Laterality: N/A;  . LUMBAR [place]  . POLYPECTOMY N/A 02/01/2013   Procedure: POLYPECTOMY;  Surgeon: Danie Binder, MD;  Location: [facility];  Service: Endoscopy;  Laterality: N/A;       Family History  Problem Relation Age of Onset  . Heart attack Mother   . CVA Mother   . Heart attack Father   . Colon cancer Neg Hx     Social History   Tobacco Use  . Smoking status: Former Smoker    Packs/day: 1.00    Years: 20.00    Pack years: 20.00    Types: Cigars    Start date: 06/25/1975    Quit date: 08/04/2013    Years since quitting: 6.0  . Smokeless tobacco: Never Used  Substance Use Topics  . Alcohol use: Not Currently    Comment: Remote history of ETOH abuse, "when I was young and dumb"   . Drug use: No    Home Medications Prior to Admission medications   Medication Sig Start Date End Date Taking? Authorizing Provider  acetaminophen (TYLENOL) 500 MG tablet Take 1,000 mg by mouth 2 (two)  times daily as needed for mild pain or moderate pain.     [provider]  allopurinol (ZYLOPRIM) 100 MG tablet Take 200 mg by mouth every morning.    [provider]  aspirin EC 81 MG EC tablet Take 1 tablet (81 mg total) by mouth daily with breakfast. Patient not taking: Reported on 07/24/2019 04/21/19   Roxan Hockey, MD  calcium acetate (PHOSLO) 667 MG capsule Take 667 mg by mouth 3 (three) times daily with meals.    [provider]  dexamethasone (DECADRON) 6 MG tablet Take 1 tablet (6 mg total) by mouth daily. 07/27/19   Kathie Dike, MD  DULoxetine (CYMBALTA) 60 MG capsule Take 1 capsule (60 mg total) by mouth daily. 04/21/19  Roxan Hockey, MD  furosemide (LASIX) 40 MG tablet Take 40 mg by mouth 2 (two) times daily. 06/17/19   [provider]  gabapentin (NEURONTIN) 100 MG capsule Take 1 capsule (100 mg total) by mouth at bedtime. 04/21/19   Roxan Hockey, MD  hydrALAZINE (APRESOLINE) 50 MG tablet Take 2 tablets (100 mg total) by mouth 3 (three) times daily. 10/20/18   Arnoldo Lenis, MD  HYDROcodone-acetaminophen (NORCO/VICODIN) 5-325 MG tablet Take 1 tablet by mouth 3 (three) times daily. 06/30/19   [provider]  hydrOXYzine (ATARAX/VISTARIL) 25 MG tablet Take 1 tablet (25 mg total) by mouth every 8 (eight) hours as needed for anxiety, itching or nausea. Patient not taking: Reported on 07/24/2019 04/21/19   Roxan Hockey, MD  methocarbamol (ROBAXIN) 750 MG tablet Take 1 tablet (750 mg total) by mouth 3 (three) times daily. For muscle pain/spasm Patient taking differently: Take 750 mg by mouth 3 (three) times daily as needed. For muscle pain/spasm 04/21/19   Roxan Hockey, MD  metoprolol tartrate (LOPRESSOR) 25 MG tablet Take 1 tablet (25 mg total) by mouth 2 (two) times daily. 04/21/19   Roxan Hockey, MD  nitroGLYCERIN (NITROSTAT) 0.4 MG SL tablet Place 1 tablet (0.4 mg total) under the tongue every 5 (five) minutes x 3 doses as  needed for chest pain (if no relief after 3rd dose, proceed to the ED for an evaluation or call 911). 10/04/18   Arnoldo Lenis, MD  pravastatin (PRAVACHOL) 40 MG tablet Take 40 mg by mouth daily. 07/01/19   [provider]  ruxolitinib phosphate (JAKAFI) 5 MG tablet Take 5 mg by mouth daily.    [provider]  traZODone (DESYREL) 50 MG tablet Take 1 tablet (50 mg total) by mouth at bedtime as needed for sleep. 04/21/19   Roxan Hockey, MD    Allergies    Hydrocodone  Review of Systems   Review of Systems  All other systems reviewed and are negative.   Physical Exam Updated Vital Signs BP (!) 155/86 (BP Location: Right Arm)   Pulse 92   Temp 97.6 F (36.4 C) (Oral)   Resp 18   Ht 6' (1.829 m)   Wt 80.3 kg   SpO2 96%   BMI 24.01 kg/m   Physical Exam Vitals and nursing note reviewed.  Constitutional:      General: He is not in acute distress.    Appearance: He is well-developed.  HENT:     Head: Normocephalic and atraumatic.     Right Ear: External ear normal.     Left Ear: External ear normal.  Eyes:     Extraocular Movements: Extraocular movements intact.     Conjunctiva/sclera: Conjunctivae normal.     Pupils: Pupils are equal, round, and reactive to light.  Cardiovascular:     Rate and Rhythm: Normal rate. Rhythm irregular.  Pulmonary:     Effort: Pulmonary effort is normal. No respiratory distress.     Breath sounds: Examination of the right-lower field reveals decreased breath sounds. Decreased breath sounds present.  Abdominal:     General: There is distension.     Tenderness: There is abdominal tenderness.  Musculoskeletal:        General: No deformity.     Cervical back: Normal range of motion and neck supple.     Comments: Patient socks have made marks on his legs however his legs appear very skinny without obvious edema  Patient has a fistula in his left forearm.  Skin:  General: Skin is warm and dry.     Coloration: Skin is  not jaundiced.  Neurological:     General: No focal deficit present.     Mental Status: He is alert and oriented to person, place, and time.     Cranial Nerves: No cranial nerve deficit.  Psychiatric:        Mood and Affect: Mood normal.        Behavior: Behavior normal.        Thought Content: Thought content normal.     ED Results / Procedures / Treatments   Labs (all labs ordered are listed, but only abnormal results are displayed) Results for orders placed or performed during the hospital encounter of 08/24/19  Comprehensive metabolic panel  Result Value Ref Range   Sodium 143 135 - 145 mmol/L   Potassium 4.5 3.5 - 5.1 mmol/L   Chloride 102 98 - 111 mmol/L   CO2 30 22 - 32 mmol/L   Glucose, Bld 119 (H) 70 - 99 mg/dL   BUN 25 (H) 8 - 23 mg/dL   Creatinine, Ser 4.13 (H) 0.61 - 1.24 mg/dL   Calcium 8.6 (L) 8.9 - 10.3 mg/dL   Total Protein 5.7 (L) 6.5 - 8.1 g/dL   Albumin 2.9 (L) 3.5 - 5.0 g/dL   AST 34 15 - 41 U/L   ALT 23 0 - 44 U/L   Alkaline Phosphatase 161 (H) 38 - 126 U/L   Total Bilirubin 1.3 (H) 0.3 - 1.2 mg/dL   GFR calc non Af Amer 13 (L) >60 mL/min   GFR calc Af Amer 15 (L) >60 mL/min   Anion gap 11 5 - 15  CBC with Differential  Result Value Ref Range   WBC 8.5 4.0 - 10.5 K/uL   RBC 3.49 (L) 4.22 - 5.81 MIL/uL   Hemoglobin 10.8 (L) 13.0 - 17.0 g/dL   HCT 33.5 (L) 39.0 - 52.0 %   MCV 96.0 80.0 - 100.0 fL   MCH 30.9 26.0 - 34.0 pg   MCHC 32.2 30.0 - 36.0 g/dL   RDW 21.1 (H) 11.5 - 15.5 %   Platelets 38 (L) 150 - 400 K/uL   nRBC 23.1 (H) 0.0 - 0.2 %   Neutrophils Relative % 32 %   Neutro Abs 2.8 1.7 - 7.7 K/uL   Lymphocytes Relative 22 %   Lymphs Abs 1.9 0.7 - 4.0 K/uL   Monocytes Relative 19 %   Monocytes Absolute 1.6 (H) 0.1 - 1.0 K/uL   Eosinophils Relative 0 %   Eosinophils Absolute 0.0 0.0 - 0.5 K/uL   Basophils Relative 4 %   Basophils Absolute 0.3 (H) 0.0 - 0.1 K/uL   Smear Review PENDING PATHOLOGIST REVIEW    Immature Granulocytes 23 %   Abs  Immature Granulocytes 1.92 (H) 0.00 - 0.07 K/uL  Protime-INR  Result Value Ref Range   Prothrombin Time 13.2 11.4 - 15.2 seconds   INR 1.0 0.8 - 1.2  APTT  Result Value Ref Range   aPTT 38 (H) 24 - 36 seconds  Lipase, blood  Result Value Ref Range   Lipase 22 11 - 51 U/L  Troponin I (High Sensitivity)  Result Value Ref Range   Troponin I (High Sensitivity) 20 (H) <18 ng/L   Laboratory interpretation all normal except Chronic renal failure, mild anemia, minimally elevated initial troponin which was higher a month ago   EKG EKG Interpretation  Date/Time:  Wednesday August 24 2019 05:08:40 EST Ventricular Rate:  95 PR Interval:    QRS Duration: 110 QT Interval:  397 QTC Calculation: 484 R Axis:   12 Text Interpretation: Sinus rhythm Ventricular bigeminy Borderline prolonged QT interval No significant change since last tracing 23 Jul 2019 other than PVC's now present Confirmed by Rolland Porter [phone]) on 08/24/2019 5:12:48 AM   Radiology DG Chest Port 1 View  Result Date: 08/24/2019 CLINICAL DATA:  Chest pain in a dialysis patient EXAM: PORTABLE CHEST 1 VIEW COMPARISON:  07/23/2019 FINDINGS: Perma catheter on the right with tip at the right atrium, unchanged. Low volume chest with interstitial prominence. Borderline heart size. No effusion or pneumothorax. IMPRESSION: Low volume chest with vascular congestion. Airspace opacity has improved from 07/23/2019. Electronically Signed   By: Monte Fantasia M.D.   On: 08/24/2019 05:46    Procedures Procedures (including critical care time)  Medications Ordered in ED Medications  fentaNYL (SUBLIMAZE) injection 50 mcg (has no administration in time range)  aspirin tablet 325 mg (325 mg Oral Given 08/24/19 0633)  nitroGLYCERIN (NITROGLYN) 2 % ointment 1 inch (1 inch Topical Given 08/24/19 0633)  fentaNYL (SUBLIMAZE) injection 50 mcg (50 mcg Intravenous Given 08/24/19 4469)    ED Course  I have reviewed the triage vital signs and the nursing  notes.  Pertinent labs & imaging results that were available during my care of the patient were reviewed by me and considered in my medical decision making (see chart for details).    MDM Rules/Calculators/A&P                      Patient reports lower chest pain for about 25 hours with a history of stent in 2018.  He was given baby aspirin to chew and nitroglycerin was placed on his chest.  He also presents with abdominal distention that he states started with his Covid infection that he was diagnosed with on January 2.  CT abdomen was done, patient appears to have some ascites.  Bladder scan could not be done because the scanner was broken.  6:45 AM patient was informed his initial troponin was encouraging, it was lower than what he had in January and he says he has had constant pain for over 24 hours.  His fentanyl had to be repeated.  Patient was unable to drink the full amount of contrast for his CT scan.  Patient was turned over to Dr. Roderic Palau at change of shift to get the results of his CT scan.   Final Clinical Impression(s) / ED Diagnoses Final diagnoses:  Chest pain, unspecified type  Abdominal distention  Epigastric abdominal pain    Rx / DC Orders   Disposition pending  Rolland Porter, MD, Barbette Or, MD 08/24/19 [phone]

## 2019-08-26 DIAGNOSIS — D631 Anemia in chronic kidney disease: Secondary | ICD-10-CM | POA: Diagnosis not present

## 2019-08-26 DIAGNOSIS — D509 Iron deficiency anemia, unspecified: Secondary | ICD-10-CM | POA: Diagnosis not present

## 2019-08-26 DIAGNOSIS — Z992 Dependence on renal dialysis: Secondary | ICD-10-CM | POA: Diagnosis not present

## 2019-08-26 DIAGNOSIS — N186 End stage renal disease: Secondary | ICD-10-CM | POA: Diagnosis not present

## 2019-08-26 DIAGNOSIS — E559 Vitamin D deficiency, unspecified: Secondary | ICD-10-CM | POA: Diagnosis not present

## 2019-08-29 DIAGNOSIS — D509 Iron deficiency anemia, unspecified: Secondary | ICD-10-CM | POA: Diagnosis not present

## 2019-08-29 DIAGNOSIS — Z992 Dependence on renal dialysis: Secondary | ICD-10-CM | POA: Diagnosis not present

## 2019-08-29 DIAGNOSIS — N186 End stage renal disease: Secondary | ICD-10-CM | POA: Diagnosis not present

## 2019-08-29 DIAGNOSIS — D631 Anemia in chronic kidney disease: Secondary | ICD-10-CM | POA: Diagnosis not present

## 2019-08-29 DIAGNOSIS — E559 Vitamin D deficiency, unspecified: Secondary | ICD-10-CM | POA: Diagnosis not present

## 2019-08-30 DIAGNOSIS — I509 Heart failure, unspecified: Secondary | ICD-10-CM | POA: Diagnosis not present

## 2019-08-30 DIAGNOSIS — N186 End stage renal disease: Secondary | ICD-10-CM | POA: Diagnosis not present

## 2019-08-30 DIAGNOSIS — I10 Essential (primary) hypertension: Secondary | ICD-10-CM | POA: Diagnosis not present

## 2019-08-30 DIAGNOSIS — D696 Thrombocytopenia, unspecified: Secondary | ICD-10-CM | POA: Diagnosis not present

## 2019-08-30 DIAGNOSIS — Z992 Dependence on renal dialysis: Secondary | ICD-10-CM | POA: Diagnosis not present

## 2019-08-30 DIAGNOSIS — Z8616 Personal history of COVID-19: Secondary | ICD-10-CM | POA: Diagnosis not present

## 2019-08-30 DIAGNOSIS — F418 Other specified anxiety disorders: Secondary | ICD-10-CM | POA: Diagnosis not present

## 2019-08-30 DIAGNOSIS — Z940 Kidney transplant status: Secondary | ICD-10-CM | POA: Diagnosis not present

## 2019-08-30 DIAGNOSIS — R188 Other ascites: Secondary | ICD-10-CM | POA: Diagnosis not present

## 2019-08-30 DIAGNOSIS — R160 Hepatomegaly, not elsewhere classified: Secondary | ICD-10-CM | POA: Diagnosis not present

## 2019-08-30 DIAGNOSIS — I110 Hypertensive heart disease with heart failure: Secondary | ICD-10-CM | POA: Diagnosis not present

## 2019-08-30 DIAGNOSIS — D469 Myelodysplastic syndrome, unspecified: Secondary | ICD-10-CM | POA: Diagnosis not present

## 2019-08-30 DIAGNOSIS — Z87891 Personal history of nicotine dependence: Secondary | ICD-10-CM | POA: Diagnosis not present

## 2019-08-31 DIAGNOSIS — E559 Vitamin D deficiency, unspecified: Secondary | ICD-10-CM | POA: Diagnosis not present

## 2019-08-31 DIAGNOSIS — D509 Iron deficiency anemia, unspecified: Secondary | ICD-10-CM | POA: Diagnosis not present

## 2019-08-31 DIAGNOSIS — D631 Anemia in chronic kidney disease: Secondary | ICD-10-CM | POA: Diagnosis not present

## 2019-08-31 DIAGNOSIS — Z992 Dependence on renal dialysis: Secondary | ICD-10-CM | POA: Diagnosis not present

## 2019-08-31 DIAGNOSIS — N186 End stage renal disease: Secondary | ICD-10-CM | POA: Diagnosis not present

## 2019-09-01 ENCOUNTER — Encounter: Payer: Self-pay | Admitting: Nurse Practitioner

## 2019-09-01 ENCOUNTER — Ambulatory Visit: Payer: Medicare Other | Admitting: Gastroenterology

## 2019-09-01 ENCOUNTER — Other Ambulatory Visit: Payer: Self-pay

## 2019-09-01 ENCOUNTER — Ambulatory Visit (HOSPITAL_COMMUNITY)
Admission: RE | Admit: 2019-09-01 | Discharge: 2019-09-01 | Disposition: A | Discharge status: Home / Self Care | Payer: Medicare Other | Source: Ambulatory Visit | Attending: Nurse Practitioner | Admitting: Nurse Practitioner

## 2019-09-01 ENCOUNTER — Encounter (HOSPITAL_COMMUNITY): Payer: Self-pay

## 2019-09-01 ENCOUNTER — Other Ambulatory Visit (HOSPITAL_COMMUNITY): Payer: Self-pay | Admitting: Nephrology

## 2019-09-01 ENCOUNTER — Ambulatory Visit (INDEPENDENT_AMBULATORY_CARE_PROVIDER_SITE_OTHER): Payer: Medicare Other | Admitting: Nurse Practitioner

## 2019-09-01 VITALS — BP 148/72 | HR 71 | Temp 97.3°F | Ht 72.0 in | Wt 179.4 lb

## 2019-09-01 DIAGNOSIS — R1011 Right upper quadrant pain: Secondary | ICD-10-CM

## 2019-09-01 DIAGNOSIS — Z992 Dependence on renal dialysis: Secondary | ICD-10-CM

## 2019-09-01 DIAGNOSIS — R188 Other ascites: Secondary | ICD-10-CM | POA: Diagnosis not present

## 2019-09-01 DIAGNOSIS — N186 End stage renal disease: Secondary | ICD-10-CM

## 2019-09-01 DIAGNOSIS — R935 Abnormal findings on diagnostic imaging of other abdominal regions, including retroperitoneum: Secondary | ICD-10-CM | POA: Diagnosis not present

## 2019-09-01 LAB — BODY FLUID CELL COUNT WITH DIFFERENTIAL
Eos, Fluid: 0 %
Lymphs, Fluid: 64 %
Monocyte-Macrophage-Serous Fluid: 5 % — ABNORMAL LOW (ref 50–90)
Neutrophil Count, Fluid: 31 % — ABNORMAL HIGH (ref 0–25)
Other Cells, Fluid: REACTIVE %
Total Nucleated Cell Count, Fluid: 36 cu mm (ref 0–1000)

## 2019-09-01 LAB — GRAM STAIN: Gram Stain: NONE SEEN

## 2019-09-01 NOTE — Patient Instructions (Signed)
Your health issues we discussed today were:   Extra fluid in your belly making it difficult to breathe: 1. I am sorry you are not feeling well! 2. We are setting you up for a paracentesis (fluid removal from your belly) with labs tested on the fluid 3. As we discussed, we can only remove 4 L on your first paracentesis 4. If there is more fluid left still we can set her up for another paracentesis next week 5. This should help you feel better and breathe better  Possible liver scarring based on your recent CT: 1. Only see you back in 2 weeks we can discuss other testing to evaluate if there are liver problems going on 2. Call us if you see any yellowing of your skin or eyes, itching head to toe all over, tremors/shakes that are new  Overall I recommend:  1. Continue your other current medications 2. Return for follow-up in 2 weeks 3. Call us if you have any questions or concerns   At [facility] we value your feedback. You may receive a survey about your visit today. Please share your experience as we strive to create trusting relationships with our patients to provide genuine, compassionate, quality care.  We appreciate your understanding and patience as we review any laboratory studies, imaging, and other diagnostic tests that are ordered as we care for you. Our office policy is 5 business days for review of these results, and any emergent or urgent results are addressed in a timely manner for your best interest. If you do not hear from our office in 1 week, please contact us.   We also encourage the use of MyChart, which contains your medical information for your review as well. If you are not enrolled in this feature, an access code is on this after visit summary for your convenience. Thank you for allowing [place] to be involved in your care.  It was great to see you today!  I hope you feel better soon!!

## 2019-09-01 NOTE — Progress Notes (Signed)
Paracentesis complete no signs of distress.  

## 2019-09-01 NOTE — Procedures (Signed)
   US guided RLQ paracentesis  4 Liters yellow fluid obtained  Tolerated well Sent for labs per MD  EBL 0

## 2019-09-01 NOTE — Progress Notes (Signed)
Referring Provider: [name], MD Primary Care Physician:  [name], MD Primary GI:  Dr. Oneida Alar  Chief Complaint  Patient presents with  . Bloated    Swelling in abd,shortness of breath    HPI:   [name] is a 74 y.o. male who presents for abdominal swelling.  The patient was last seen in our office 11/18/2017 for right upper quadrant abdominal pain, history of colon polyps, GERD.  At that time GERD not well controlled on Zantac every evening.  Recommended avoid triggers, drink water, continue Protonix 30 minutes prior to breakfast, you Zantac when needed.  Right upper quadrant pain felt most likely due to [facility] with differentials also including H. pylori gastritis, uncontrolled GERD, less likely GE junction tumor or chronic mesenteric ischemia.  Recommended start Protonix 30 minutes a day, Zantac as needed, avoid triggers.  Recommended updated colonoscopy due to a total of 23 polyps removed in August 2014.  Follow-up in 4 months.  Colonoscopy completed 12/08/2017 which found tortuous colon, internal and external hemorrhoids, otherwise normal.  Recommended follow-up in 3 years (2022).  EGD completed the same day found no source for right upper quadrant pain, mild gastritis status post biopsy.  Surgical pathology found the biopsies to be gastric antral and oxyntic mucosa without specific histopathological changes.  Recommended consider HIDA scan, follow-up in 4 months.  Recommended discussed with Dr. Florene Glen risks and benefits of continuing [place].  Complete HIDA scan, referral to GI department at [facility].  Appointment at [facility] scheduled for 03/02/2018 with Dr. Jerene Pitch.   Scan completed 12/07/2017 found normal gallbladder EF and noted abdominal pain throughout the exam which increased during gallbladder EF segment.  Recent CT of the abdomen and pelvis 08/24/2019 found stable hepatomegaly with slight nodular contour of the capsule suggesting  underlying cirrhosis.  No focal liver abnormality.  Continued splenomegaly as well.  Large volume ascites increased since prior study.  Also with patchy groundglass airspace disease in the right lung base which is nonspecific and could reflect atypical pneumonia given recent COVID-19 diagnosis.  Review of care everywhere, does not appear the patient was seen by GI at [facility].  Most recent hematology note dated 08/30/2019.  Labs at that time showed WBC count 12.5, hemoglobin low at 10.8, platelets low at 27.  CMP with normal AST/ALT of 33/19, elevated alkaline phosphatase mildly at 150, bilirubin normal at 1.0.  Today he states he's not doing well. Has a worsening swollen abdomen making him short of breath. 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 Dr. Florene Glen feels his cancer is not getting worse due to most of his numbers staying the same. Having some worsening abdominal pain, worse with swelling. Denies N/V. Has had dark stools. Denies hematochezia. Has fecal urgency as well. Denies fever, chills. Has gained about 5 lbs due to the fluid gain/abdominal swelling. Denies URI or flu-like symptoms. Denies loss of sense of taste or smell. Denies chest pain, dyspnea, dizziness, lightheadedness, syncope, near syncope. Denies any other upper or lower GI symptoms.  Past Medical History:  Diagnosis Date  . Anxiety   . Arthritis   . BPH (benign prostatic hyperplasia)   . CAD (coronary artery disease)    DES to circumflex 07/2016, moderate residual LAD and RCA, small 90% OM3 - managed medically  . CHF (congestive heart failure) ([place])   . Chronic lower back pain   . Depression   . ESRD (end stage renal disease) ([place])  Hemo MWF [facility]  . Essential hypertension   . Gastritis   . GERD (gastroesophageal reflux disease)   . History of kidney stones   . Leukemia ([place])   . Leukocytosis   . 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 Pleural effusion    had Thorancentesis at [facility]  . Pneumonia    last time 08/2018- Aspiration Pneumonia - unintentional opiate overdose  . Renal insufficiency   . Spleen enlarged   . Thrombocythemia ([place])   . Type II diabetes mellitus ([place])     Past Surgical History:  Procedure Laterality Date  . AV FISTULA PLACEMENT Left 04/14/2019   Procedure: ARTERIOVENOUS (AV) FISTULA CREATION LEFT ARM;  Surgeon: Rosetta Posner, MD;  Location: [place];  Service: Vascular;  Laterality: Left;  . BIOPSY  12/08/2017   Procedure: BIOPSY;  Surgeon: Danie Binder, MD;  [facility];  Service: Endoscopy;;  gastric  . BONE MARROW BIOPSY     x 3 times  . CARDIAC CATHETERIZATION N/A 08/12/2016   Procedure: Left Heart Cath and Coronary Angiography;  Surgeon: Jettie Booze, MD;  Location: [place] CV LAB;  Service: Cardiovascular;  Laterality: N/A;  . CARDIAC CATHETERIZATION N/A 08/12/2016   Procedure: Coronary Stent Intervention;  Surgeon: Jettie Booze, MD;  Location: [place] CV LAB;  Service: Cardiovascular;  Laterality: N/A;  . CARDIAC CATHETERIZATION  2000s X 1  . COLONOSCOPY WITH PROPOFOL N/A 02/01/2013   SLF: 1. 23 colon polyps removed. 2 retrieved. 2. Mild diverticulosis in teh sigmoid colon 3. Small internal hemorrhoids 4. The colon is redundant   . COLONOSCOPY WITH PROPOFOL N/A 12/08/2017   Procedure: COLONOSCOPY WITH PROPOFOL;  Surgeon: Danie Binder, MD;  [facility];  Service: Endoscopy;  Laterality: N/A;  1:45pm  . CYSTOSCOPY W/ STONE MANIPULATION    . ESOPHAGOGASTRODUODENOSCOPY (EGD) WITH PROPOFOL N/A 02/01/2013   SLF: 1. Moderate erosive gastritis  . ESOPHAGOGASTRODUODENOSCOPY (EGD) WITH PROPOFOL N/A 12/08/2017   Procedure: ESOPHAGOGASTRODUODENOSCOPY (EGD) WITH PROPOFOL;  Surgeon: Danie Binder, MD;  [facility];  Service: Endoscopy;  Laterality: N/A;  . LUMBAR [place]  . POLYPECTOMY N/A 02/01/2013   Procedure:  POLYPECTOMY;  Surgeon: Danie Binder, MD;  Location: [facility] ORS;  Service: Endoscopy;  Laterality: N/A;    Current Outpatient Medications  Medication Sig Dispense Refill  . acetaminophen (TYLENOL) 500 MG tablet Take 1,000 mg by mouth 2 (two) times daily as needed for mild pain or moderate pain.     Marland Kitchen allopurinol (ZYLOPRIM) 100 MG tablet Take 200 mg by mouth every morning.    . DULoxetine (CYMBALTA) 60 MG capsule Take 1 capsule (60 mg total) by mouth daily. 30 capsule 3  . labetalol (NORMODYNE) 100 MG tablet Take 100 mg by mouth 2 (two) times daily.    . nitroGLYCERIN (NITROSTAT) 0.4 MG SL tablet Place 1 tablet (0.4 mg total) under the tongue every 5 (five) minutes x 3 doses as needed for chest pain (if no relief after 3rd dose, proceed to the ED for an evaluation or call 911). 25 tablet 3  . oxyCODONE-acetaminophen (PERCOCET) 10-325 MG tablet Take 1 tablet by mouth every 6 (six) hours.    . pravastatin (PRAVACHOL) 40 MG tablet Take 40 mg by mouth daily.    . ruxolitinib phosphate (JAKAFI) 5 MG tablet Take 5 mg by mouth daily.    Marland Kitchen  sevelamer carbonate (RENVELA) 800 MG tablet Take 800 mg by mouth 3 (three) times daily with meals. Takes 3 tablets by mouth 3 times daily with meals.    . traMADol (ULTRAM-ER) 100 MG 24 hr tablet Take 100 mg by mouth every 12 (twelve) hours.    . traZODone (DESYREL) 50 MG tablet Take 1 tablet (50 mg total) by mouth at bedtime as needed for sleep. 30 tablet 3  . aspirin EC 81 MG EC tablet Take 1 tablet (81 mg total) by mouth daily with breakfast. (Patient not taking: Reported on 07/24/2019) 120 tablet 2  . calcium acetate (PHOSLO) 667 MG capsule Take 667 mg by mouth 3 (three) times daily with meals.    Marland Kitchen dexamethasone (DECADRON) 6 MG tablet Take 1 tablet (6 mg total) by mouth daily. (Patient not taking: Reported on 09/01/2019) 7 tablet 0  . furosemide (LASIX) 40 MG tablet Take 40 mg by mouth 2 (two) times daily.    Marland Kitchen gabapentin (NEURONTIN) 100 MG capsule Take 1 capsule (100 mg  total) by mouth at bedtime. (Patient not taking: Reported on 09/01/2019) 30 capsule 1  . hydrALAZINE (APRESOLINE) 50 MG tablet Take 2 tablets (100 mg total) by mouth 3 (three) times daily. (Patient not taking: Reported on 09/01/2019) 540 tablet 3  . HYDROcodone-acetaminophen (NORCO/VICODIN) 5-325 MG tablet Take 1 tablet by mouth 3 (three) times daily.    . hydrOXYzine (ATARAX/VISTARIL) 25 MG tablet Take 1 tablet (25 mg total) by mouth every 8 (eight) hours as needed for anxiety, itching or nausea. (Patient not taking: Reported on 07/24/2019) 30 tablet 0  . methocarbamol (ROBAXIN) 750 MG tablet Take 1 tablet (750 mg total) by mouth 3 (three) times daily. For muscle pain/spasm (Patient not taking: Reported on 09/01/2019) 90 tablet 0  . metoprolol tartrate (LOPRESSOR) 25 MG tablet Take 1 tablet (25 mg total) by mouth 2 (two) times daily. (Patient not taking: Reported on 09/01/2019) 60 tablet 3   No current facility-administered medications for this visit.    Allergies as of 09/01/2019 - Review Complete 09/01/2019  Allergen Reaction Noted  . Hydrocodone Itching 08/08/2015    Family History  Problem Relation Age of Onset  . Heart attack Mother   . CVA Mother   . Heart attack Father   . Colon cancer Neg Hx     Social History   Socioeconomic History  . Marital status: Divorced    Spouse name: Not on file  . Number of children: Not on file  . Years of education: Not on file  . Highest education level: Not on file  Occupational History  . Occupation: retired    Comment: Architect  Tobacco Use  . Smoking status: Former Smoker    Packs/day: 1.00    Years: 20.00    Pack years: 20.00    Types: Cigars    Start date: 06/25/1975    Quit date: 08/04/2013    Years since quitting: 6.0  . Smokeless tobacco: Never Used  Substance and Sexual Activity  . Alcohol use: Not Currently    Comment: Remote history of ETOH abuse, "when I was young and dumb"   . Drug use: No  . Sexual activity: Yes     Birth control/protection: None  Other Topics Concern  . Not on file  Social History Narrative  . Not on file   Social Determinants of Health   Financial Resource Strain:   . Difficulty of Paying Living Expenses: Not on file  Food Insecurity:   .  Worried About Charity fundraiser in the Last Year: Not on file  . Ran Out of Food in the Last Year: Not on file  Transportation Needs:   . Lack of Transportation (Medical): Not on file  . Lack of Transportation (Non-Medical): Not on file  Physical Activity:   . Days of Exercise per Week: Not on file  . Minutes of Exercise per Session: Not on file  Stress:   . Feeling of Stress : Not on file  Social Connections:   . Frequency of Communication with Friends and Family: Not on file  . Frequency of Social Gatherings with Friends and Family: Not on file  . Attends Religious Services: Not on file  . Active Member of Clubs or Organizations: Not on file  . Attends Archivist Meetings: Not on file  . Marital Status: Not on file    Review of Systems: General: Negative for anorexia, weight loss, fever, chills, fatigue, weakness. ENT: Negative for hoarseness, difficulty swallowing. CV: Negative for chest pain, angina, palpitations, peripheral edema.  Respiratory: Negative for dyspnea at rest, cough, sputum, wheezing.  GI: See history of present illness.  MS: Negative for joint pain, low back pain.  Derm: Negative for rash or itching.  Endo: Negative for unusual weight change.  Heme: Negative for bruising or bleeding. Allergy: Negative for rash or hives.   Physical Exam: BP (!) 148/72   Pulse 71   Temp (!) 97.3 F (36.3 C) (Temporal)   Ht 6' (1.829 m)   Wt 179 lb 6.4 oz (81.4 kg)   BMI 24.33 kg/m  General:   Alert and oriented. Pleasant and cooperative. Well-nourished and well-developed. Appears very uncomfortable. Head:  Normocephalic and atraumatic. Eyes:  Without icterus, sclera clear and conjunctiva pink.  Ears:  Normal  auditory acuity. Cardiovascular:  S1, S2 present without murmurs appreciated. Extremities without clubbing or edema. Respiratory:  Clear to auscultation bilaterally. No wheezes, rales, or rhonchi. Appears with some labored breathing, unable to speak in complete sentences.  Gastrointestinal:  +BS, firm, distended, fluid wave noted. No HSM noted. No guarding or rebound. No masses appreciated.  Rectal:  Deferred  Musculoskalatal:  Symmetrical without gross deformities. Neurologic:  Alert and oriented x4;  grossly normal neurologically. Psych:  Alert and cooperative. Normal mood and affect. Heme/Lymph/Immune: No excessive bruising noted.    09/01/2019 11:21 AM   Disclaimer: This note was dictated with voice recognition software. Similar sounding words can inadvertently be transcribed and may not be corrected upon review.

## 2019-09-02 ENCOUNTER — Ambulatory Visit: Payer: Medicare Other | Admitting: Gastroenterology

## 2019-09-02 DIAGNOSIS — D631 Anemia in chronic kidney disease: Secondary | ICD-10-CM | POA: Diagnosis not present

## 2019-09-02 DIAGNOSIS — Z992 Dependence on renal dialysis: Secondary | ICD-10-CM | POA: Diagnosis not present

## 2019-09-02 DIAGNOSIS — D509 Iron deficiency anemia, unspecified: Secondary | ICD-10-CM | POA: Diagnosis not present

## 2019-09-02 DIAGNOSIS — E559 Vitamin D deficiency, unspecified: Secondary | ICD-10-CM | POA: Diagnosis not present

## 2019-09-02 DIAGNOSIS — N186 End stage renal disease: Secondary | ICD-10-CM | POA: Diagnosis not present

## 2019-09-02 LAB — CYTOLOGY - NON PAP

## 2019-09-02 NOTE — Assessment & Plan Note (Signed)
Patient with apparent large volume ascites today which is causing dyspnea.  CT imaging 08/24/2019 noted large volume ascites that seem to be increased from prior study.  Also found slightly nodular contour of the liver capsule suggesting underlying cirrhosis, which is apparently new finding.  Chronic splenomegaly.  The patient is treated by Dr. Florene Glen and reports no worsening [facility] for leukemia and MDS. Also with ESRD, near anuric on hemodialysis.  Not sure of the exact etiology of his ascites given the new finding of cirrhosis and leukemia.  We will plan for an urgent abdominal paracentesis limited to 4 L maximum withdrawal (due to first-time paracentesis).  We will check labs including cell count, albumin, culture and cytology, Gram stain.  Depending on the amount of estimated residual fluid letter a repeat paracentesis early next week, if needed.  I will have him follow-up in 2 weeks and consider initiating work-up for cirrhosis including ultrasound elastography and labs.  Need to have cirrhosis, will be difficult to calculate meld score given his laboratory derangements with other underlying chronic conditions.

## 2019-09-02 NOTE — Assessment & Plan Note (Signed)
CT abdomen completed 08/24/19 shows what appears to be new finding of cirrhosis (compared to previous CT imaging in 06/2019 and 2019 with only hepatomegaly.)  This certainly need further work-up.  The priority at this time history is quite symptomatic or if I am ascites.  Labs as per above on the ascitic fluid to try to discern etiology and source.  I will have him follow-up in 2 weeks at which point take further steps for evaluation including an upper quadrant ultrasound elastography to document if there is significant fibrosis and/or cirrhosis.  Further labs may be useful, although with his other chronic conditions it would be difficult to calculate a meld score should he indeed have cirrhosis.  Further recommendations to follow.

## 2019-09-05 ENCOUNTER — Ambulatory Visit (HOSPITAL_COMMUNITY)
Admission: RE | Admit: 2019-09-05 | Discharge: 2019-09-05 | Disposition: A | Discharge status: Home / Self Care | Payer: Medicare Other | Source: Ambulatory Visit | Attending: Gastroenterology | Admitting: Gastroenterology

## 2019-09-05 ENCOUNTER — Telehealth: Payer: Self-pay | Admitting: Gastroenterology

## 2019-09-05 ENCOUNTER — Telehealth: Payer: Self-pay

## 2019-09-05 ENCOUNTER — Other Ambulatory Visit: Payer: Self-pay

## 2019-09-05 ENCOUNTER — Encounter (HOSPITAL_COMMUNITY): Payer: Self-pay

## 2019-09-05 DIAGNOSIS — R188 Other ascites: Secondary | ICD-10-CM

## 2019-09-05 LAB — GRAM STAIN

## 2019-09-05 LAB — BODY FLUID CELL COUNT WITH DIFFERENTIAL
Eos, Fluid: 0 %
Lymphs, Fluid: 49 %
Monocyte-Macrophage-Serous Fluid: 18 % — ABNORMAL LOW (ref 50–90)
Neutrophil Count, Fluid: 33 % — ABNORMAL HIGH (ref 0–25)
Total Nucleated Cell Count, Fluid: 48 cu mm (ref 0–1000)

## 2019-09-05 NOTE — Telephone Encounter (Signed)
[phone] PLEASE CALL PATIENT, HE IS ASKING ABOUT HAVING FLUID TAKEN OFF, STATES IT IS GETTING HARD FOR HIM TO BREATHE

## 2019-09-05 NOTE — Progress Notes (Signed)
Paracentesis complete no signs of distress.  

## 2019-09-05 NOTE — Telephone Encounter (Signed)
See other message from today

## 2019-09-05 NOTE — Telephone Encounter (Signed)
See other note, he is going today for para.

## 2019-09-05 NOTE — Telephone Encounter (Signed)
Called central scheduling back, pt can go now for para at [facility]. Pt's daughter aware.

## 2019-09-05 NOTE — Telephone Encounter (Signed)
T/C from pt's daughter, Helyn App ( [phone]) she is asking can the para be scheduled today.  I told her I tried to call the pt back and left VM and we had not heard from him.  She said he doesn't answer his phone some and can you call her for the appointment info.   She is aware Tretha Sciara is at lunch and will call to schedule when she returns.

## 2019-09-05 NOTE — Telephone Encounter (Signed)
I spoke to pt and he said he is having more difficulty and [name] Asked could he be done today?   Forwarding to [place] to check on it.

## 2019-09-05 NOTE — Telephone Encounter (Signed)
Spoke to [place] at central scheduling, she scheduled for para to be done tomorrow while he is at [employer] (appt already scheduled).   Called and informed daughter.

## 2019-09-05 NOTE — Telephone Encounter (Signed)
Please arrange [place] para. He had first ever para last week, so amount removed was limited. Needs albumin per protocol. Cell count and culture.

## 2019-09-05 NOTE — Telephone Encounter (Signed)
Pt called and said he has as much fluid again as he had last week on 09/01/2019 when he had a para.  He is getting shortness of breath also.  I am forwarding this to Roseanne Kaufman, NP who is doing Hospital rounds in [place] absence today.  Vicente Males, please advise!

## 2019-09-05 NOTE — Telephone Encounter (Signed)
LMOM that we will be scheduling a para.

## 2019-09-05 NOTE — Procedures (Signed)
INDICATION: Ascites  EXAM: ULTRASOUND GUIDED left PARACENTESIS  COMPLICATIONS: None immediate.  PROCEDURE: Informed written consent was obtained from the patient after a discussion of the risks, benefits and alternatives to treatment. A timeout was performed prior to the initiation of the procedure.  Initial ultrasound scanning demonstrates a large amount of ascites within the left lower abdominal quadrant. The left lower abdomen was prepped and draped in the usual sterile fashion. 1% lidocaine  was used for local anesthesia.   Following this, a yueh catheter was introduced. An ultrasound image was saved for documentation purposes. The paracentesis was performed. The catheter was removed and a dressing was applied. The patient tolerated the procedure well without immediate post procedural complication.   FINDINGS A total of approximately 7 L of straw-colored fluid was removed. Samples were sent to the laboratory as requested by the clinical team.  IMPRESSION:  Successful ultrasound-guided paracentesis yielding 7 liters of peritoneal fluid.

## 2019-09-06 ENCOUNTER — Encounter (HOSPITAL_COMMUNITY): Payer: Self-pay

## 2019-09-06 ENCOUNTER — Ambulatory Visit (HOSPITAL_COMMUNITY): Payer: Medicare Other

## 2019-09-06 ENCOUNTER — Ambulatory Visit (HOSPITAL_COMMUNITY)
Admission: RE | Admit: 2019-09-06 | Discharge: 2019-09-06 | Disposition: A | Discharge status: Home / Self Care | Payer: Medicare Other | Source: Ambulatory Visit | Attending: Nephrology | Admitting: Nephrology

## 2019-09-06 DIAGNOSIS — N186 End stage renal disease: Secondary | ICD-10-CM | POA: Insufficient documentation

## 2019-09-06 DIAGNOSIS — Z4901 Encounter for fitting and adjustment of extracorporeal dialysis catheter: Secondary | ICD-10-CM | POA: Insufficient documentation

## 2019-09-06 DIAGNOSIS — R188 Other ascites: Secondary | ICD-10-CM | POA: Diagnosis not present

## 2019-09-06 DIAGNOSIS — E1165 Type 2 diabetes mellitus with hyperglycemia: Secondary | ICD-10-CM | POA: Diagnosis not present

## 2019-09-06 DIAGNOSIS — E1122 Type 2 diabetes mellitus with diabetic chronic kidney disease: Secondary | ICD-10-CM | POA: Diagnosis not present

## 2019-09-06 DIAGNOSIS — Z87891 Personal history of nicotine dependence: Secondary | ICD-10-CM | POA: Diagnosis not present

## 2019-09-06 DIAGNOSIS — Z299 Encounter for prophylactic measures, unspecified: Secondary | ICD-10-CM | POA: Diagnosis not present

## 2019-09-06 DIAGNOSIS — D696 Thrombocytopenia, unspecified: Secondary | ICD-10-CM | POA: Diagnosis not present

## 2019-09-06 DIAGNOSIS — Z6825 Body mass index (BMI) 25.0-25.9, adult: Secondary | ICD-10-CM | POA: Diagnosis not present

## 2019-09-06 HISTORY — PX: IR REMOVAL TUN CV CATH W/O FL: IMG2289

## 2019-09-06 LAB — PATHOLOGIST SMEAR REVIEW

## 2019-09-06 LAB — CULTURE, BODY FLUID W GRAM STAIN -BOTTLE: Culture: NO GROWTH

## 2019-09-06 MED ORDER — LIDOCAINE HCL 1 % IJ SOLN
INTRAMUSCULAR | Status: AC
  Filled 2019-09-06: qty 20

## 2019-09-06 MED ORDER — CHLORHEXIDINE GLUCONATE 4 % EX LIQD
CUTANEOUS | Status: AC
  Filled 2019-09-06: qty 15

## 2019-09-06 NOTE — Procedures (Signed)
Pre procedural Dx: ESRD Post procedural Dx: Same  Successful removal of tunneled HD catheter  EBL: None No immediate complications. 4x4 gauze + tegaderm applied - dressing to remain x 24H, do not shower until dressing is removed.  Please see imaging section of Epic for full dictation.  Joaquim Nam PA-C 09/06/2019 3:03 PM

## 2019-09-06 NOTE — Telephone Encounter (Signed)
Noted, thanks to all for the assistance.

## 2019-09-07 ENCOUNTER — Other Ambulatory Visit: Payer: Self-pay

## 2019-09-07 ENCOUNTER — Telehealth: Payer: Self-pay | Admitting: Gastroenterology

## 2019-09-07 DIAGNOSIS — Z992 Dependence on renal dialysis: Secondary | ICD-10-CM | POA: Diagnosis not present

## 2019-09-07 DIAGNOSIS — N186 End stage renal disease: Secondary | ICD-10-CM | POA: Diagnosis not present

## 2019-09-07 DIAGNOSIS — D509 Iron deficiency anemia, unspecified: Secondary | ICD-10-CM | POA: Diagnosis not present

## 2019-09-07 DIAGNOSIS — R188 Other ascites: Secondary | ICD-10-CM

## 2019-09-07 DIAGNOSIS — E559 Vitamin D deficiency, unspecified: Secondary | ICD-10-CM | POA: Diagnosis not present

## 2019-09-07 DIAGNOSIS — D631 Anemia in chronic kidney disease: Secondary | ICD-10-CM | POA: Diagnosis not present

## 2019-09-07 NOTE — Telephone Encounter (Signed)
Para scheduled for 09/09/19 at 9:00am, arrive at 8:45am. Called and informed pt.

## 2019-09-07 NOTE — Telephone Encounter (Signed)
Ok. He had 4L drawn off Thursday and then another 7L on Monday.  Let's get him in for an U/S para ASAP, Albumin per protocol, cell count and culture.

## 2019-09-07 NOTE — Telephone Encounter (Signed)
Pt called asking to have fluid drawn off. Please advise. [phone]

## 2019-09-07 NOTE — Telephone Encounter (Signed)
Routing to EG 

## 2019-09-09 ENCOUNTER — Other Ambulatory Visit: Payer: Self-pay

## 2019-09-09 ENCOUNTER — Ambulatory Visit (HOSPITAL_COMMUNITY)
Admission: RE | Admit: 2019-09-09 | Discharge: 2019-09-09 | Disposition: A | Discharge status: Home / Self Care | Payer: Medicare Other | Source: Ambulatory Visit | Attending: Nurse Practitioner | Admitting: Nurse Practitioner

## 2019-09-09 ENCOUNTER — Encounter (HOSPITAL_COMMUNITY): Payer: Self-pay

## 2019-09-09 DIAGNOSIS — R188 Other ascites: Secondary | ICD-10-CM | POA: Insufficient documentation

## 2019-09-09 LAB — BODY FLUID CELL COUNT WITH DIFFERENTIAL
Eos, Fluid: 0 %
Lymphs, Fluid: 34 %
Monocyte-Macrophage-Serous Fluid: 36 % — ABNORMAL LOW (ref 50–90)
Neutrophil Count, Fluid: 30 % — ABNORMAL HIGH (ref 0–25)
Total Nucleated Cell Count, Fluid: 32 cu mm (ref 0–1000)

## 2019-09-09 LAB — GRAM STAIN: Gram Stain: NONE SEEN

## 2019-09-09 NOTE — Progress Notes (Signed)
Paracentesis complete no signs of distress.  

## 2019-09-12 ENCOUNTER — Telehealth: Payer: Self-pay | Admitting: Gastroenterology

## 2019-09-12 ENCOUNTER — Other Ambulatory Visit: Payer: Self-pay

## 2019-09-12 DIAGNOSIS — N186 End stage renal disease: Secondary | ICD-10-CM | POA: Diagnosis not present

## 2019-09-12 DIAGNOSIS — E559 Vitamin D deficiency, unspecified: Secondary | ICD-10-CM | POA: Diagnosis not present

## 2019-09-12 DIAGNOSIS — R188 Other ascites: Secondary | ICD-10-CM

## 2019-09-12 DIAGNOSIS — D509 Iron deficiency anemia, unspecified: Secondary | ICD-10-CM | POA: Diagnosis not present

## 2019-09-12 DIAGNOSIS — D631 Anemia in chronic kidney disease: Secondary | ICD-10-CM | POA: Diagnosis not present

## 2019-09-12 DIAGNOSIS — Z992 Dependence on renal dialysis: Secondary | ICD-10-CM | POA: Diagnosis not present

## 2019-09-12 DIAGNOSIS — K7460 Unspecified cirrhosis of liver: Secondary | ICD-10-CM

## 2019-09-12 LAB — PATHOLOGIST SMEAR REVIEW

## 2019-09-12 NOTE — Telephone Encounter (Signed)
Pt called to see if he could have more fluid drawn off his abdomen. He said it could be either Wednesday or Thursday this week if possible. Please advise [phone]

## 2019-09-12 NOTE — Telephone Encounter (Signed)
We can set him up for a para. Labs: cell count, and culture. Albumin per protocol.  Please tell the patient it is unfortunate that this is suddenly happening. It is likely related to (apparent) cirrhosis of the liver on his CT scan. Cirrhosis patient typically do develop large amounts of fluid like this. His kidney disease isn't helping either, as we typically try to slow the fluid accumulation with diuretics but because he doesn't make urine we cannot do this. It would be rare to be due to his leukemia, although I will ask reports to be sent to Dr. Florene Glen for his review and opinion.  I am going to put in some labs to evaluate his liver function and a special ultrasound to evaluate for degree of liver scarring. He can have these drawn at [facility] when he goes for his paracentesis.

## 2019-09-12 NOTE — Telephone Encounter (Signed)
Pt is aware of the info.   Forwarding to [place] to schedule the Para.   [name], he was aware to do labs at [facility], just remind him to do them when he goes for the para and I have faxed those to [facility] lab.

## 2019-09-12 NOTE — Telephone Encounter (Signed)
Para scheduled for 09/15/19 at 1:15pm, arrive at 1:00pm. [place] 09/16/19 at 9:30am, arrive at 9:15am. NPO after midnight prior to test. Called and informed pt of appts.

## 2019-09-12 NOTE — Telephone Encounter (Signed)
I spoke to pt and he had para on 09/09/2019.  He said he already has a lot more fluid and some shortness of breath.  He wants to know if he can get scheduled for Thursday to have another para.  He wants to know why this is happening to him and he is getting so much fluid.   Forwarding to Walden Field, NP who last saw pt in the office.  Randall Hiss, please advise!

## 2019-09-13 LAB — CULTURE, BODY FLUID W GRAM STAIN -BOTTLE: Culture: NO GROWTH

## 2019-09-14 ENCOUNTER — Encounter (HOSPITAL_COMMUNITY): Payer: Self-pay | Admitting: *Deleted

## 2019-09-14 ENCOUNTER — Ambulatory Visit (HOSPITAL_COMMUNITY)
Admission: RE | Admit: 2019-09-14 | Discharge: 2019-09-14 | Disposition: A | Discharge status: Home / Self Care | Payer: Medicare Other | Source: Ambulatory Visit | Attending: Nurse Practitioner | Admitting: Nurse Practitioner

## 2019-09-14 ENCOUNTER — Emergency Department (HOSPITAL_COMMUNITY): Payer: Medicare Other

## 2019-09-14 ENCOUNTER — Inpatient Hospital Stay (HOSPITAL_COMMUNITY)
Admission: EM | Admit: 2019-09-14 | Discharge: 2019-09-20 | DRG: 082 | Disposition: A | Discharge status: Home Health | Payer: Medicare Other | Attending: Internal Medicine | Admitting: Internal Medicine

## 2019-09-14 ENCOUNTER — Other Ambulatory Visit: Payer: Self-pay

## 2019-09-14 ENCOUNTER — Encounter (HOSPITAL_COMMUNITY): Payer: Self-pay

## 2019-09-14 DIAGNOSIS — I2583 Coronary atherosclerosis due to lipid rich plaque: Secondary | ICD-10-CM | POA: Diagnosis present

## 2019-09-14 DIAGNOSIS — R188 Other ascites: Secondary | ICD-10-CM | POA: Diagnosis present

## 2019-09-14 DIAGNOSIS — R140 Abdominal distension (gaseous): Secondary | ICD-10-CM

## 2019-09-14 DIAGNOSIS — I509 Heart failure, unspecified: Secondary | ICD-10-CM | POA: Diagnosis not present

## 2019-09-14 DIAGNOSIS — E119 Type 2 diabetes mellitus without complications: Secondary | ICD-10-CM

## 2019-09-14 DIAGNOSIS — I609 Nontraumatic subarachnoid hemorrhage, unspecified: Secondary | ICD-10-CM | POA: Diagnosis not present

## 2019-09-14 DIAGNOSIS — N2581 Secondary hyperparathyroidism of renal origin: Secondary | ICD-10-CM | POA: Diagnosis not present

## 2019-09-14 DIAGNOSIS — D696 Thrombocytopenia, unspecified: Secondary | ICD-10-CM

## 2019-09-14 DIAGNOSIS — D631 Anemia in chronic kidney disease: Secondary | ICD-10-CM | POA: Diagnosis present

## 2019-09-14 DIAGNOSIS — I2510 Atherosclerotic heart disease of native coronary artery without angina pectoris: Secondary | ICD-10-CM | POA: Diagnosis present

## 2019-09-14 DIAGNOSIS — Z79899 Other long term (current) drug therapy: Secondary | ICD-10-CM

## 2019-09-14 DIAGNOSIS — I615 Nontraumatic intracerebral hemorrhage, intraventricular: Secondary | ICD-10-CM

## 2019-09-14 DIAGNOSIS — K7460 Unspecified cirrhosis of liver: Secondary | ICD-10-CM | POA: Diagnosis present

## 2019-09-14 DIAGNOSIS — D471 Chronic myeloproliferative disease: Secondary | ICD-10-CM | POA: Diagnosis not present

## 2019-09-14 DIAGNOSIS — E1122 Type 2 diabetes mellitus with diabetic chronic kidney disease: Secondary | ICD-10-CM | POA: Diagnosis present

## 2019-09-14 DIAGNOSIS — H532 Diplopia: Secondary | ICD-10-CM | POA: Diagnosis present

## 2019-09-14 DIAGNOSIS — Z8249 Family history of ischemic heart disease and other diseases of the circulatory system: Secondary | ICD-10-CM

## 2019-09-14 DIAGNOSIS — K219 Gastro-esophageal reflux disease without esophagitis: Secondary | ICD-10-CM | POA: Diagnosis present

## 2019-09-14 DIAGNOSIS — Z9115 Patient's noncompliance with renal dialysis: Secondary | ICD-10-CM

## 2019-09-14 DIAGNOSIS — N186 End stage renal disease: Secondary | ICD-10-CM | POA: Diagnosis present

## 2019-09-14 DIAGNOSIS — Y92538 Other ambulatory health services establishments as the place of occurrence of the external cause: Secondary | ICD-10-CM | POA: Diagnosis present

## 2019-09-14 DIAGNOSIS — W1839XA Other fall on same level, initial encounter: Secondary | ICD-10-CM | POA: Diagnosis present

## 2019-09-14 DIAGNOSIS — S066X9A Traumatic subarachnoid hemorrhage with loss of consciousness of unspecified duration, initial encounter: Principal | ICD-10-CM | POA: Diagnosis present

## 2019-09-14 DIAGNOSIS — Z8616 Personal history of COVID-19: Secondary | ICD-10-CM

## 2019-09-14 DIAGNOSIS — Z885 Allergy status to narcotic agent status: Secondary | ICD-10-CM

## 2019-09-14 DIAGNOSIS — I10 Essential (primary) hypertension: Secondary | ICD-10-CM | POA: Diagnosis present

## 2019-09-14 DIAGNOSIS — H4922 Sixth [abducent] nerve palsy, left eye: Secondary | ICD-10-CM

## 2019-09-14 DIAGNOSIS — Z823 Family history of stroke: Secondary | ICD-10-CM | POA: Diagnosis not present

## 2019-09-14 DIAGNOSIS — Z992 Dependence on renal dialysis: Secondary | ICD-10-CM | POA: Diagnosis not present

## 2019-09-14 DIAGNOSIS — Z955 Presence of coronary angioplasty implant and graft: Secondary | ICD-10-CM | POA: Diagnosis not present

## 2019-09-14 DIAGNOSIS — J9601 Acute respiratory failure with hypoxia: Secondary | ICD-10-CM

## 2019-09-14 DIAGNOSIS — D6959 Other secondary thrombocytopenia: Secondary | ICD-10-CM | POA: Diagnosis present

## 2019-09-14 DIAGNOSIS — Z79891 Long term (current) use of opiate analgesic: Secondary | ICD-10-CM

## 2019-09-14 DIAGNOSIS — S199XXA Unspecified injury of neck, initial encounter: Secondary | ICD-10-CM | POA: Diagnosis not present

## 2019-09-14 DIAGNOSIS — S066X0A Traumatic subarachnoid hemorrhage without loss of consciousness, initial encounter: Secondary | ICD-10-CM | POA: Diagnosis not present

## 2019-09-14 DIAGNOSIS — D469 Myelodysplastic syndrome, unspecified: Secondary | ICD-10-CM | POA: Diagnosis present

## 2019-09-14 DIAGNOSIS — I132 Hypertensive heart and chronic kidney disease with heart failure and with stage 5 chronic kidney disease, or end stage renal disease: Secondary | ICD-10-CM | POA: Diagnosis not present

## 2019-09-14 DIAGNOSIS — I120 Hypertensive chronic kidney disease with stage 5 chronic kidney disease or end stage renal disease: Secondary | ICD-10-CM | POA: Diagnosis present

## 2019-09-14 DIAGNOSIS — I608 Other nontraumatic subarachnoid hemorrhage: Secondary | ICD-10-CM

## 2019-09-14 DIAGNOSIS — Z87891 Personal history of nicotine dependence: Secondary | ICD-10-CM | POA: Diagnosis not present

## 2019-09-14 DIAGNOSIS — S0990XA Unspecified injury of head, initial encounter: Secondary | ICD-10-CM

## 2019-09-14 DIAGNOSIS — U071 COVID-19: Secondary | ICD-10-CM | POA: Diagnosis not present

## 2019-09-14 LAB — PROTIME-INR
INR: 1 (ref 0.8–1.2)
Prothrombin Time: 13.5 seconds (ref 11.4–15.2)

## 2019-09-14 LAB — COMPREHENSIVE METABOLIC PANEL
ALT: 34 U/L (ref 0–44)
AST: 37 U/L (ref 15–41)
Albumin: 2.7 g/dL — ABNORMAL LOW (ref 3.5–5.0)
Alkaline Phosphatase: 173 U/L — ABNORMAL HIGH (ref 38–126)
Anion gap: 10 (ref 5–15)
BUN: 45 mg/dL — ABNORMAL HIGH (ref 8–23)
CO2: 25 mmol/L (ref 22–32)
Calcium: 8.4 mg/dL — ABNORMAL LOW (ref 8.9–10.3)
Chloride: 102 mmol/L (ref 98–111)
Creatinine, Ser: 4.19 mg/dL — ABNORMAL HIGH (ref 0.61–1.24)
GFR calc Af Amer: 15 mL/min — ABNORMAL LOW (ref >60)
GFR calc non Af Amer: 13 mL/min — ABNORMAL LOW (ref >60)
Glucose, Bld: 139 mg/dL — ABNORMAL HIGH (ref 70–99)
Potassium: 4 mmol/L (ref 3.5–5.1)
Sodium: 137 mmol/L (ref 135–145)
Total Bilirubin: 0.5 mg/dL (ref 0.3–1.2)
Total Protein: 5.4 g/dL — ABNORMAL LOW (ref 6.5–8.1)

## 2019-09-14 LAB — BODY FLUID CELL COUNT WITH DIFFERENTIAL
Lymphs, Fluid: 64 %
Monocyte-Macrophage-Serous Fluid: 16 % — ABNORMAL LOW (ref 50–90)
Neutrophil Count, Fluid: 20 % (ref 0–25)
Total Nucleated Cell Count, Fluid: 29 cu mm (ref 0–1000)

## 2019-09-14 LAB — GLUCOSE, CAPILLARY
Glucose-Capillary: 81 mg/dL (ref 70–99)
Glucose-Capillary: 99 mg/dL (ref 70–99)

## 2019-09-14 LAB — RESPIRATORY PANEL BY RT PCR (FLU A&B, COVID)
Influenza A by PCR: NEGATIVE
Influenza B by PCR: NEGATIVE
SARS Coronavirus 2 by RT PCR: POSITIVE — AB

## 2019-09-14 LAB — GRAM STAIN: Gram Stain: NONE SEEN

## 2019-09-14 LAB — CBC
HCT: 27.5 % — ABNORMAL LOW (ref 39.0–52.0)
Hemoglobin: 9 g/dL — ABNORMAL LOW (ref 13.0–17.0)
MCH: 31.6 pg (ref 26.0–34.0)
MCHC: 32.7 g/dL (ref 30.0–36.0)
MCV: 96.5 fL (ref 80.0–100.0)
Platelets: 67 10*3/uL — ABNORMAL LOW (ref 150–400)
RBC: 2.85 MIL/uL — ABNORMAL LOW (ref 4.22–5.81)
RDW: 21.4 % — ABNORMAL HIGH (ref 11.5–15.5)
WBC: 8.2 10*3/uL (ref 4.0–10.5)
nRBC: 2.2 % — ABNORMAL HIGH (ref 0.0–0.2)

## 2019-09-14 LAB — CULTURE, BODY FLUID W GRAM STAIN -BOTTLE: Culture: NO GROWTH

## 2019-09-14 LAB — CBG MONITORING, ED: Glucose-Capillary: 125 mg/dL — ABNORMAL HIGH (ref 70–99)

## 2019-09-14 LAB — APTT: aPTT: 35 seconds (ref 24–36)

## 2019-09-14 MED ORDER — IOHEXOL 350 MG/ML SOLN
100.0000 mL | Freq: Once | INTRAVENOUS | Status: AC | PRN
  Administered 2019-09-14: 12:00:00 100 mL via INTRAVENOUS

## 2019-09-14 MED ORDER — SODIUM CHLORIDE 0.9% FLUSH
3.0000 mL | INTRAVENOUS | Status: DC | PRN
  Administered 2019-09-17: 3 mL via INTRAVENOUS

## 2019-09-14 MED ORDER — OXYCODONE HCL 5 MG PO TABS
5.0000 mg | ORAL_TABLET | Freq: Four times a day (QID) | ORAL | Status: DC
  Administered 2019-09-14 – 2019-09-15 (×4): 5 mg via ORAL
  Filled 2019-09-14 (×4): qty 1

## 2019-09-14 MED ORDER — PRAVASTATIN SODIUM 40 MG PO TABS
40.0000 mg | ORAL_TABLET | Freq: Every day | ORAL | Status: DC
  Filled 2019-09-14: qty 1

## 2019-09-14 MED ORDER — TRAMADOL HCL 50 MG PO TABS: 50.0000 mg | ORAL_TABLET | Freq: Four times a day (QID) | ORAL | Status: DC

## 2019-09-14 MED ORDER — TRAMADOL HCL ER 100 MG PO TB24: 100.0000 mg | ORAL_TABLET | Freq: Two times a day (BID) | ORAL | Status: DC

## 2019-09-14 MED ORDER — RUXOLITINIB PHOSPHATE 5 MG PO TABS
5.0000 mg | ORAL_TABLET | Freq: Every day | ORAL | Status: DC
  Administered 2019-09-15 – 2019-09-20 (×6): 5 mg via ORAL
  Filled 2019-09-14 (×6): qty 1

## 2019-09-14 MED ORDER — INSULIN ASPART 100 UNIT/ML ~~LOC~~ SOLN: 0.0000 [IU] | Freq: Every day | SUBCUTANEOUS | Status: DC

## 2019-09-14 MED ORDER — OXYCODONE-ACETAMINOPHEN 10-325 MG PO TABS: 1.0000 | ORAL_TABLET | Freq: Four times a day (QID) | ORAL | Status: DC

## 2019-09-14 MED ORDER — ONDANSETRON HCL 4 MG/2ML IJ SOLN
4.0000 mg | Freq: Four times a day (QID) | INTRAMUSCULAR | Status: DC | PRN
  Administered 2019-09-14: 4 mg via INTRAVENOUS
  Filled 2019-09-14: qty 2

## 2019-09-14 MED ORDER — SEVELAMER CARBONATE 800 MG PO TABS
800.0000 mg | ORAL_TABLET | Freq: Three times a day (TID) | ORAL | Status: DC
  Administered 2019-09-14 – 2019-09-20 (×17): 800 mg via ORAL
  Filled 2019-09-14 (×24): qty 1

## 2019-09-14 MED ORDER — HYDROMORPHONE HCL 1 MG/ML IJ SOLN
0.5000 mg | Freq: Once | INTRAMUSCULAR | Status: AC
  Administered 2019-09-14: 0.5 mg via INTRAVENOUS
  Filled 2019-09-14: qty 1

## 2019-09-14 MED ORDER — TRAMADOL HCL 50 MG PO TABS
50.0000 mg | ORAL_TABLET | Freq: Four times a day (QID) | ORAL | Status: DC
  Administered 2019-09-14 – 2019-09-15 (×3): 50 mg via ORAL
  Filled 2019-09-14 (×3): qty 1

## 2019-09-14 MED ORDER — PRAVASTATIN SODIUM 40 MG PO TABS
40.0000 mg | ORAL_TABLET | Freq: Every day | ORAL | Status: DC
  Filled 2019-09-14 (×2): qty 1

## 2019-09-14 MED ORDER — ACETAMINOPHEN 325 MG PO TABS
650.0000 mg | ORAL_TABLET | Freq: Four times a day (QID) | ORAL | Status: DC | PRN
  Administered 2019-09-15 – 2019-09-19 (×6): 650 mg via ORAL
  Filled 2019-09-14 (×7): qty 2

## 2019-09-14 MED ORDER — ONDANSETRON HCL 4 MG PO TABS: 4.0000 mg | ORAL_TABLET | Freq: Four times a day (QID) | ORAL | Status: DC | PRN

## 2019-09-14 MED ORDER — ONDANSETRON HCL 4 MG/2ML IJ SOLN
4.0000 mg | Freq: Once | INTRAMUSCULAR | Status: AC
  Administered 2019-09-14: 4 mg via INTRAVENOUS
  Filled 2019-09-14: qty 2

## 2019-09-14 MED ORDER — HYDROMORPHONE HCL 1 MG/ML IJ SOLN
0.5000 mg | Freq: Once | INTRAMUSCULAR | Status: AC
  Administered 2019-09-14: 11:00:00 0.5 mg via INTRAVENOUS
  Filled 2019-09-14: qty 1

## 2019-09-14 MED ORDER — FENTANYL CITRATE (PF) 100 MCG/2ML IJ SOLN
12.5000 ug | INTRAMUSCULAR | Status: DC | PRN
  Administered 2019-09-14 (×2): 12.5 ug via INTRAVENOUS
  Filled 2019-09-14 (×2): qty 2

## 2019-09-14 MED ORDER — ACETAMINOPHEN 650 MG RE SUPP: 650.0000 mg | Freq: Four times a day (QID) | RECTAL | Status: DC | PRN

## 2019-09-14 MED ORDER — OXYCODONE-ACETAMINOPHEN 5-325 MG PO TABS
1.0000 | ORAL_TABLET | Freq: Four times a day (QID) | ORAL | Status: DC
  Administered 2019-09-14 – 2019-09-15 (×4): 1 via ORAL
  Filled 2019-09-14 (×4): qty 1

## 2019-09-14 MED ORDER — DULOXETINE HCL 60 MG PO CPEP
60.0000 mg | ORAL_CAPSULE | Freq: Every day | ORAL | Status: DC
  Administered 2019-09-15 – 2019-09-20 (×6): 60 mg via ORAL
  Filled 2019-09-14 (×6): qty 1

## 2019-09-14 MED ORDER — HYDROMORPHONE HCL 1 MG/ML IJ SOLN: 0.5000 mg | Freq: Once | INTRAMUSCULAR | Status: DC

## 2019-09-14 MED ORDER — INSULIN ASPART 100 UNIT/ML ~~LOC~~ SOLN
0.0000 [IU] | Freq: Three times a day (TID) | SUBCUTANEOUS | Status: DC
  Administered 2019-09-16 – 2019-09-17 (×2): 1 [IU] via SUBCUTANEOUS

## 2019-09-14 MED ORDER — TRAZODONE HCL 50 MG PO TABS
50.0000 mg | ORAL_TABLET | Freq: Every evening | ORAL | Status: DC | PRN
  Administered 2019-09-15 – 2019-09-19 (×6): 50 mg via ORAL
  Filled 2019-09-14 (×6): qty 1

## 2019-09-14 MED ORDER — SODIUM CHLORIDE 0.9% FLUSH
3.0000 mL | Freq: Two times a day (BID) | INTRAVENOUS | Status: DC
  Administered 2019-09-14 – 2019-09-19 (×11): 3 mL via INTRAVENOUS

## 2019-09-14 MED ORDER — LABETALOL HCL 100 MG PO TABS
100.0000 mg | ORAL_TABLET | Freq: Two times a day (BID) | ORAL | Status: DC
  Administered 2019-09-14 – 2019-09-20 (×12): 100 mg via ORAL
  Filled 2019-09-14 (×12): qty 1

## 2019-09-14 MED ORDER — TRAMADOL HCL 50 MG PO TABS
50.0000 mg | ORAL_TABLET | Freq: Once | ORAL | Status: AC
  Administered 2019-09-14: 50 mg via ORAL
  Filled 2019-09-14: qty 1

## 2019-09-14 MED ORDER — MORPHINE SULFATE (PF) 4 MG/ML IV SOLN
4.0000 mg | Freq: Once | INTRAVENOUS | Status: AC
  Administered 2019-09-14: 09:00:00 4 mg via INTRAVENOUS
  Filled 2019-09-14: qty 1

## 2019-09-14 MED ORDER — SODIUM CHLORIDE 0.9 % IV SOLN: 250.0000 mL | INTRAVENOUS | Status: DC | PRN

## 2019-09-14 NOTE — ED Triage Notes (Signed)
PT brought over from the radiology department today. PT states he fell at dialysis on 09/12/2019 and started having a headache that evening. PT states this am he starting having blurred vision and decreased peripheral vision with no weakness.

## 2019-09-14 NOTE — H&P (Addendum)
History and Physical    [name] GHW:[id number] DOB: April 14, 1946 DOA: 09/14/2019  PCP: [name], MD   Patient coming from: Radiology department  Chief Complaint: Headache and blurred vision with diplopia  HPI: [name] is a 74 y.o. male with medical history significant for ESRD on HD MWF, hypertension, CAD with prior stent, myelodysplastic syndrome, liver cirrhosis with ascites, GERD, and type 2 diabetes, who was undergoing paracentesis for ascites related to liver cirrhosis this morning when he began to have worsening headache as well as double vision and blurred vision.  According to the daughter at bedside, he has actually been having some worsening confusion over the last couple months with some unsteadiness.  He apparently had a brief syncopal episode with fall after his last dialysis session on Monday 2/22.  Since then, he has been complaining of severe headaches and has been taking OTC medications without any significant relief.  He was scheduled for paracentesis this morning which he underwent with 4.3 L of fluid removed, but the procedure was halted after he started to have worsening headache as well as visual disturbances prompting the need for him to come to the ED.  He has not had any significant weakness in his limbs or other speech deficits identified.  He denies any fevers, chills, nausea, vomiting, chest pain, or shortness of breath.   ED Course: Vital signs are stable and patient is afebrile.  He is noted to have decreased platelet as well as hemoglobin levels related to his prior myelodysplastic syndrome.  Platelets are currently 67,000 and hemoglobin is 9.0.  His creatinine is 4.19 and BUN 45 and consistent with his ESRD.  No significant hyperkalemia or acidosis otherwise noted.  CT of the head demonstrates a subarachnoid hemorrhage with focal collections of hemorrhage in the lateral ventricles with no midline shift.  Findings were discussed with neurosurgery by EDP.   Neurosurgery recommends admission and further evaluation at [facility].  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  CT of the cervical spine without any other acute findings noted.  Review of Systems: All others reviewed and otherwise negative except noted in HPI above.  Past Medical History:  Diagnosis Date  . Anxiety   . Arthritis   . BPH (benign prostatic hyperplasia)   . CAD (coronary artery disease)    DES to circumflex 07/2016, moderate residual LAD and RCA, small 90% OM3 - managed medically  . CHF (congestive heart failure) ([place])   . Chronic lower back pain   . Depression   . ESRD (end stage renal disease) ([place])    Hemo MWF [facility]  . Essential hypertension   . Gastritis   . GERD (gastroesophageal reflux disease)   . History of kidney stones   . Leukemia ([place])   . Leukocytosis   . 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[place])   . 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[place])    Likely MDS/MPN, unclassifiable - folowed at [facility]  . Pleural effusion    had Thorancentesis at [facility]  . Pneumonia    last time 08/2018- Aspiration Pneumonia - unintentional opiate overdose  . Renal insufficiency   . Spleen enlarged   . Thrombocythemia ([place])   . Type II diabetes mellitus ([place])     Past Surgical History:  Procedure Laterality Date  . AV FISTULA PLACEMENT Left 04/14/2019   Procedure: ARTERIOVENOUS (AV) FISTULA CREATION LEFT ARM;  Surgeon: Rosetta Posner, MD;  Location: [facility] OR;  Service: Vascular;  Laterality: Left;  . BIOPSY  12/08/2017   Procedure: BIOPSY;  Surgeon: Danie Binder, MD;  Location: [facility] ENDO SUITE;  Service: Endoscopy;;  gastric  . BONE MARROW BIOPSY     x 3 times  . CARDIAC CATHETERIZATION N/A 08/12/2016   Procedure: Left Heart Cath and Coronary Angiography;  Surgeon: Jettie Booze, MD;  Location: [place] CV LAB;  Service: Cardiovascular;  Laterality: N/A;  . CARDIAC CATHETERIZATION N/A 08/12/2016    Procedure: Coronary Stent Intervention;  Surgeon: Jettie Booze, MD;  Location: [place] CV LAB;  Service: Cardiovascular;  Laterality: N/A;  . CARDIAC CATHETERIZATION  2000s X 1  . COLONOSCOPY WITH PROPOFOL N/A 02/01/2013   SLF: 1. 23 colon polyps removed. 2 retrieved. 2. Mild diverticulosis in teh sigmoid colon 3. Small internal hemorrhoids 4. The colon is redundant   . COLONOSCOPY WITH PROPOFOL N/A 12/08/2017   Procedure: COLONOSCOPY WITH PROPOFOL;  Surgeon: Danie Binder, MD;  Location: [facility] ENDO [facility];  Service: Endoscopy;  Laterality: N/A;  1:45pm  . CYSTOSCOPY W/ STONE MANIPULATION    . ESOPHAGOGASTRODUODENOSCOPY (EGD) WITH PROPOFOL N/A 02/01/2013   SLF: 1. Moderate erosive gastritis  . ESOPHAGOGASTRODUODENOSCOPY (EGD) WITH PROPOFOL N/A 12/08/2017   Procedure: ESOPHAGOGASTRODUODENOSCOPY (EGD) WITH PROPOFOL;  Surgeon: Danie Binder, MD;  Location[facility] END[facility];  Service: Endoscopy;  Laterality: N/A;  . IR REMOVAL TUN CV CATH W/O FL  09/06/2019  . [place]  . POLYPECTOMY N/A 02/01/2013   Procedure: POLYPECTOMY;  Surgeon: Danie Binder, MD;  Location: [facility] ORS;  Service: Endoscopy;  Laterality: N/A;     reports that he quit smoking about 6 years ago. His smoking use included cigars. He started smoking about 44 years ago. He has a 20.00 pack-year smoking history. He has never used smokeless tobacco. He reports previous alcohol use. He reports that he does not use drugs.  Allergies  Allergen Reactions  . Hydrocodone Itching    Family History  Problem Relation Age of Onset  . Heart attack Mother   . CVA Mother   . Heart attack Father   . Colon cancer Neg Hx     Prior to Admission medications   Medication Sig Start Date End Date Taking? Authorizing Provider  acetaminophen (TYLENOL) 500 MG tablet Take 1,000 mg by mouth 2 (two) times daily as needed for mild pain or moderate pain.    Yes [provider]  DULoxetine (CYMBALTA) 60 MG capsule Take 1  capsule (60 mg total) by mouth daily. 04/21/19   Roxan Hockey, MD  HYDROcodone-acetaminophen (NORCO/VICODIN) 5-325 MG tablet Take 1 tablet by mouth 3 (three) times daily. 09/06/19   [provider]  labetalol (NORMODYNE) 100 MG tablet Take 100 mg by mouth 2 (two) times daily.    [provider]  nitroGLYCERIN (NITROSTAT) 0.4 MG SL tablet Place 1 tablet (0.4 mg total) under the tongue every 5 (five) minutes x 3 doses as needed for chest pain (if no relief after 3rd dose, proceed to the ED for an evaluation or call 911). 10/04/18   Arnoldo Lenis, MD  oxyCODONE-acetaminophen (PERCOCET) 10-325 MG tablet Take 1 tablet by mouth every 6 (six) hours.    [provider]  pravastatin (PRAVACHOL) 40 MG tablet Take 40 mg by mouth daily. 07/01/19   [provider]  ruxolitinib phosphate (JAKAFI) 5 MG tablet Take 5 mg by mouth daily.    [provider]  sevelamer  carbonate (RENVELA) 800 MG tablet Take 800 mg by mouth 3 (three) times daily with meals. Takes 3 tablets by mouth 3 times daily with meals.    [provider]  traMADol (ULTRAM-ER) 100 MG 24 hr tablet Take 100 mg by mouth every 12 (twelve) hours.    [provider]  traZODone (DESYREL) 50 MG tablet Take 1 tablet (50 mg total) by mouth at bedtime as needed for sleep. 04/21/19   Roxan Hockey, MD    Physical Exam: Vitals:   09/14/19 0945 09/14/19 1000 09/14/19 1030 09/14/19 1100  BP:  (!) 160/84 (!) 145/81 (!) 150/74  Pulse: 85 90 85 75  Resp: '16 18 19 10  '$ TempSrc:      SpO2: 97% 96% 98% 98%  Weight:      Height:        Constitutional: NAD, calm, comfortable Vitals:   09/14/19 0945 09/14/19 1000 09/14/19 1030 09/14/19 1100  BP:  (!) 160/84 (!) 145/81 (!) 150/74  Pulse: 85 90 85 75  Resp: '16 18 19 10  '$ TempSrc:      SpO2: 97% 96% 98% 98%  Weight:      Height:       Eyes: lids and conjunctivae normal ENMT: Mucous membranes are moist.  Neck: normal, supple Respiratory:  clear to auscultation bilaterally. Normal respiratory effort. No accessory muscle use.  Cardiovascular: Regular rate and rhythm, no murmurs. No extremity edema. Abdomen: no tenderness, no distention. Bowel sounds positive.  Musculoskeletal:  No joint deformity upper and lower extremities.   Skin: no rashes, lesions, ulcers.  Psychiatric: Normal judgment and insight. Alert and oriented x 3. Normal mood.  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 Liver Function Tests: Recent Labs  Lab 09/14/19 0928  AST 37  ALT 34  ALKPHOS 173*  BILITOT 0.5  PROT 5.4*  ALBUMIN 2.7*   No results for input(s): LIPASE, AMYLASE in the last 168 hours. No results for input(s): AMMONIA in the last 168 hours. Coagulation Profile: Recent Labs  Lab 09/14/19 0928  INR 1.0   Cardiac Enzymes: No results for input(s): CKTOTAL, CKMB, CKMBINDEX, TROPONINI in the last 168 hours. BNP (last 3 results) No results for input(s): PROBNP in the last 8760 hours. HbA1C: No results for input(s): HGBA1C in the last 72 hours. CBG: Recent Labs  Lab 09/14/19 0906  GLUCAP 125*   Lipid Profile: No results for input(s): CHOL, HDL, LDLCALC, TRIG, CHOLHDL, LDLDIRECT in the last 72 hours. Thyroid Function Tests: No results for input(s): TSH, T4TOTAL, FREET4, T3FREE, THYROIDAB in the last 72 hours. Anemia Panel: No results for input(s): VITAMINB12, FOLATE, FERRITIN, TIBC, IRON, RETICCTPCT in the last 72 hours. Urine analysis:    Component Value Date/Time   COLORURINE AMBER  (A) 07/23/2019 1730   APPEARANCEUR HAZY (A) 07/23/2019 1730   LABSPEC 1.033 (H) 07/23/2019 1730   PHURINE 7.0 07/23/2019 1730   GLUCOSEU >=500 (A) 07/23/2019 1730   HGBUR NEGATIVE 07/23/2019 1730  BILIRUBINUR NEGATIVE 07/23/2019 1730   KETONESUR 20 (A) 07/23/2019 1730   PROTEINUR >=300 (A) 07/23/2019 1730   UROBILINOGEN 0.2 03/26/2008 1920   NITRITE NEGATIVE 07/23/2019 1730   LEUKOCYTESUR NEGATIVE 07/23/2019 1730    Radiological Exams on Admission: CT HEAD WO CONTRAST  Result Date: 09/14/2019 CLINICAL DATA:  Fall at dialysis with headache EXAM: CT HEAD WITHOUT CONTRAST CT CERVICAL SPINE WITHOUT CONTRAST TECHNIQUE: Multidetector CT imaging of the head and cervical spine was performed following the standard protocol without intravenous contrast. Multiplanar CT image reconstructions of the cervical spine were also generated. COMPARISON:  None. FINDINGS: CT HEAD FINDINGS Brain: Left para median prepontine subarachnoid hemorrhage measuring 11 mm thickness. There is a reported left 6 nerve palsy which correlates with this location. Intraventricular hemorrhage is seen layering in the lateral ventricles, likely intraventricular reflux. There is history of syncope and fall. Encephalomalacia in the inferior right temporal lobe which could be posttraumatic or ischemic. There have been remote small vessel infarcts at the basal ganglia. No hydrocephalus when accounting for atrophy. Vascular: No hyperdense vessel or unexpected calcification. Skull: Normal. Negative for fracture or focal lesion. Sinuses/Orbits: Negative Critical Value/emergent results were called by telephone at the time of interpretation on 09/14/2019 at 10:26 am to provider [facility] , who verbally acknowledged these results. CT CERVICAL SPINE FINDINGS Alignment: No traumatic malalignment Skull base and vertebrae: Negative for acute fracture Soft tissues and spinal canal: No prevertebral fluid or swelling. No visible canal hematoma. Disc  levels: Lower cervical disc and upper cervical facet degeneration. Upper chest: Negative IMPRESSION: 1. Subarachnoid hemorrhage in the prepontine cistern that is left eccentric and fairly focal/thick. Reportedly there was preceding syncope and fall. 2. Small volume hemorrhage layering in the lateral ventricles. 3. Negative for cervical spine fracture. 4. Remote small vessel infarcts. Electronically Signed   By: Monte Fantasia M.D.   On: 09/14/2019 10:29   CT CERVICAL SPINE WO CONTRAST  Result Date: 09/14/2019 CLINICAL DATA:  Fall at dialysis with headache EXAM: CT HEAD WITHOUT CONTRAST CT CERVICAL SPINE WITHOUT CONTRAST TECHNIQUE: Multidetector CT imaging of the head and cervical spine was performed following the standard protocol without intravenous contrast. Multiplanar CT image reconstructions of the cervical spine were also generated. COMPARISON:  None. FINDINGS: CT HEAD FINDINGS Brain: Left para median prepontine subarachnoid hemorrhage measuring 11 mm thickness. There is a reported left 6 nerve palsy which correlates with this location. Intraventricular hemorrhage is seen layering in the lateral ventricles, likely intraventricular reflux. There is history of syncope and fall. Encephalomalacia in the inferior right temporal lobe which could be posttraumatic or ischemic. There have been remote small vessel infarcts at the basal ganglia. No hydrocephalus when accounting for atrophy. Vascular: No hyperdense vessel or unexpected calcification. Skull: Normal. Negative for fracture or focal lesion. Sinuses/Orbits: Negative Critical Value/emergent results were called by telephone at the time of interpretation on 09/14/2019 at 10:26 am to provider [facility] , who verbally acknowledged these results. CT CERVICAL SPINE FINDINGS Alignment: No traumatic malalignment Skull base and vertebrae: Negative for acute fracture Soft tissues and spinal canal: No prevertebral fluid or swelling. No visible canal hematoma. Disc  levels: Lower cervical disc and upper cervical facet degeneration. Upper chest: Negative IMPRESSION: 1. Subarachnoid hemorrhage in the prepontine cistern that is left eccentric and fairly focal/thick. Reportedly there was preceding syncope and fall. 2. Small volume hemorrhage layering in the lateral ventricles. 3. Negative for cervical spine fracture. 4. Remote small vessel infarcts. Electronically Signed   By: Monte Fantasia  M.D.   On: 09/14/2019 10:29   US Paracentesis  Result Date: 09/14/2019 INDICATION: Cirrhosis Recurrent ascites EXAM: ULTRASOUND GUIDED  PARACENTESIS MEDICATIONS: 10 cc 1% lidocaine COMPLICATIONS: None immediate. PROCEDURE: Informed written consent was obtained from the patient after a discussion of the risks, benefits and alternatives to treatment. A timeout was performed prior to the initiation of the procedure. Initial ultrasound scanning demonstrates a large amount of ascites within the right lower abdominal quadrant. The right lower abdomen was prepped and draped in the usual sterile fashion. 1% lidocaine was used for local anesthesia. Following this, a 19 g Yueh catheter was introduced. An ultrasound image was saved for documentation purposes. The paracentesis was performed. The catheter was removed and a dressing was applied. The patient tolerated the procedure well without immediate post procedural complication. FINDINGS: A total of approximately 4.3 liters of cloudy milky fluid was removed. Samples were sent to the laboratory as requested by the clinical team. IMPRESSION: Successful ultrasound-guided paracentesis yielding 4.3 liters of peritoneal fluid. Read by Lavonia Drafts [facility] Electronically Signed   By: Lavonia Dana M.D.   On: 09/14/2019 09:07    EKG: Independently reviewed.  Sinus rhythm 87 bpm.  Assessment/Plan Active Problems:   Subarachnoid hemorrhage (HCC)    Subarachnoid hemorrhage with small volume hemorrhage in the lateral ventricles -Concern of whether this  is related to trauma or possible aneurysm -CTA of head ordered and pending -EDP has discussed with neurosurgery Dr. Arnoldo Morale who will evaluate at [facility] -Current plans are for transfer to progressive unit for closer monitoring -Pain management for headache as needed with home oral medications  Liver cirrhosis with ascites status post paracentesis 2/24 -4.3 L of fluid removed via paracentesis this morning -Continue fluid restriction for now and monitor for repeat buildup of fluid  Thrombocytopenia  -Likely multifactorial with liver cirrhosis and myelodysplastic syndrome -Also noted sequestration with history of splenomegaly -Avoid heparin agents for now and use SCDs -Monitor with repeat CBC -No need for platelet transfusion at this time  ESRD on HD MWF -Last hemodialysis on 2/22 -No urgent need for hemodialysis currently -Please consult nephrology for hemodialysis while inpatient  CAD with prior stent -Continue on labetalol and pravastatin  Myelodysplastic syndrome -Monitor CBC -Currently on Jakafi  Diabetes, type II -No significant hyperglycemia noted -Appears to be diet controlled -Diet renal/carb modified with SSI ordered  Recent Covid pneumonia and currently positive -No symptoms, therefore no treatment needed for now -Continue on isolation -No further related symptoms or hypoxemia noted -Discharged on 07/26/19  GERD -PPI  Hypertension -Continue labetalol and monitor closely.   DVT prophylaxis: SCDs Code Status: Full Family Communication: Daughter at bedside Disposition Plan: Transfer to [facility] for neurosurgery evaluation Consults called: [facility]rology discussion at [facility], consult to neurosurgery with discussion by EDP to Dr. Arnoldo Morale Admission status: Inpatient, progressive   [place] [facility]  If 7PM-7AM, please contact night-coverage [email or web address]  09/14/2019, 12:07 PM

## 2019-09-14 NOTE — Progress Notes (Signed)
Pt states during his drive to the hospital today he began having left eye blurriness. Grip strengths equal and strong without pronator drift. Left eye appears to have ocular motor loss. Now complaining of headache. Paracentesis stopped per PA and patient to be taken to ER.

## 2019-09-14 NOTE — ED Provider Notes (Addendum)
[facility] EMERGENCY DEPARTMENT Provider Note   CSN: [id number] Arrival date & time: 09/14/19  0973     History Chief Complaint  Patient presents with  . Headache    [name] is a 74 y.o. male.  Patient with hx esrd/hd M/W/F, states stood up post HD Monday and felt faint, bil legs gave way, and fell hitting head. Notes dull, left headache since, mod-severe, constant, non radiating.  Today awoke and generally didn't feel well, and then noticed that his vision seemed blurry - if patient closes either eye, states vision seems clear/normal, but with both eyes open is intermittently blurry. Denies visual field cut or amaurosis. No eye pain. Denies any focal or unilateral numbness or weakness. No change in speech.   The history is provided by the patient and the EMS personnel.  Headache Associated symptoms: neck pain   Associated symptoms: no abdominal pain, no back pain, no cough, no eye pain, no fever, no nausea, no numbness, no sore throat and no vomiting        Past Medical History:  Diagnosis Date  . Anxiety   . Arthritis   . BPH (benign prostatic hyperplasia)   . CAD (coronary artery disease)    DES to circumflex 07/2016, moderate residual LAD and RCA, small 90% OM3 - managed medically  . CHF (congestive heart failure) ([place])   . Chronic lower back pain   . Depression   . ESRD (end stage renal disease) ([place])    Hemo MWF [facility]  . Essential hypertension   . Gastritis   . GERD (gastroesophageal reflux disease)   . History of kidney stones   . Leukemia ([place])   . Leukocytosis   . 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 Pleural effusion    had Thorancentesis at [facility]  . Pneumonia    last time 08/2018- Aspiration Pneumonia - unintentional opiate overdose  . Renal insufficiency   . Spleen enlarged   . Thrombocythemia ([place])   . Type II diabetes mellitus [facility])      Patient Active Problem List   Diagnosis Date Noted  . Ascites 09/01/2019  . Abnormal CT of the abdomen 09/01/2019  . Acute respiratory disease due to COVID-19 virus 07/23/2019  . Intractable pain 04/19/2019  . Elevated d-dimer 04/19/2019  . RUQ abdominal pain 11/18/2017  . Acute CHF (congestive heart failure) ([place]) 09/15/2016  . Essential hypertension 08/14/2016  . Headache 08/14/2016  . Hyperlipidemia LDL goal <70 08/14/2016  . Abnormal nuclear stress test   . 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 Acute respiratory failure with hypoxia ([place]) 04/27/2016  . Hypoxia   . Hematoma 04/25/2016  . Mass of chest wall, left   . Abnormal partial thromboplastin time (PTT)   . 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 Coronary artery disease due to lipid rich plaque 04/15/2016  . Leukocytosis 04/15/2016  . Diabetes mellitus ([place]) 04/15/2016  . Pleuritic pain 04/15/2016  . ESRD on hemodialysis ([place]) 04/15/2016  . Left shoulder pain 04/15/2016  . History of adenomatous polyp of colon 01/21/2013  . GERD (gastroesophageal reflux disease) 01/21/2013    Past Surgical History:  Procedure Laterality Date  . AV FISTULA PLACEMENT Left 04/14/2019   Procedure: ARTERIOVENOUS (AV) FISTULA CREATION LEFT ARM;  Surgeon: Rosetta Posner, MD;  Location: [place];  Service: Vascular;  Laterality: Left;  . BIOPSY  12/08/2017   Procedure: BIOPSY;  Surgeon:  Fields, Marga Melnick, MD;  [facility];  Service: Endoscopy;;  gastric  . BONE MARROW BIOPSY     x 3 times  . CARDIAC CATHETERIZATION N/A 08/12/2016   Procedure: Left Heart Cath and Coronary Angiography;  Surgeon: Jettie Booze, MD;  Location: [place] CV LAB;  Service: Cardiovascular;  Laterality: N/A;  . CARDIAC CATHETERIZATION N/A 08/12/2016   Procedure: Coronary Stent Intervention;  Surgeon: Jettie Booze, MD;  Location: [place] CV LAB;  Service: Cardiovascular;  Laterality: N/A;  . CARDIAC CATHETERIZATION  2000s X 1  .  COLONOSCOPY WITH PROPOFOL N/A 02/01/2013   SLF: 1. 23 colon polyps removed. 2 retrieved. 2. Mild diverticulosis in teh sigmoid colon 3. Small internal hemorrhoids 4. The colon is redundant   . COLONOSCOPY WITH PROPOFOL N/A 12/08/2017   Procedure: COLONOSCOPY WITH PROPOFOL;  Surgeon: Danie Binder, MD;  [facility];  Service: Endoscopy;  Laterality: N/A;  1:45pm  . CYSTOSCOPY W/ STONE MANIPULATION    . ESOPHAGOGASTRODUODENOSCOPY (EGD) WITH PROPOFOL N/A 02/01/2013   SLF: 1. Moderate erosive gastritis  . ESOPHAGOGASTRODUODENOSCOPY (EGD) WITH PROPOFOL N/A 12/08/2017   Procedure: ESOPHAGOGASTRODUODENOSCOPY (EGD) WITH PROPOFOL;  Surgeon: Danie Binder, MD;  [facility];  Service: Endoscopy;  Laterality: N/A;  . IR REMOVAL TUN CV CATH W/O FL  09/06/2019  . [place]  . POLYPECTOMY N/A 02/01/2013   Procedure: POLYPECTOMY;  Surgeon: Danie Binder, MD;  Location: [facility] ORS;  Service: Endoscopy;  Laterality: N/A;       Family History  Problem Relation Age of Onset  . Heart attack Mother   . CVA Mother   . Heart attack Father   . Colon cancer Neg Hx     Social History   Tobacco Use  . Smoking status: Former Smoker    Packs/day: 1.00    Years: 20.00    Pack years: 20.00    Types: Cigars    Start date: 06/25/1975    Quit date: 08/04/2013    Years since quitting: 6.1  . Smokeless tobacco: Never Used  Substance Use Topics  . Alcohol use: Not Currently    Comment: Remote history of ETOH abuse, "when I was young and dumb"   . Drug use: No    Home Medications Prior to Admission medications   Medication Sig Start Date End Date Taking? Authorizing Provider  acetaminophen (TYLENOL) 500 MG tablet Take 1,000 mg by mouth 2 (two) times daily as needed for mild pain or moderate pain.     [provider]  allopurinol (ZYLOPRIM) 100 MG tablet Take 200 mg by mouth every morning.    [provider]  DULoxetine (CYMBALTA) 60 MG capsule Take 1  capsule (60 mg total) by mouth daily. 04/21/19   Roxan Hockey, MD  labetalol (NORMODYNE) 100 MG tablet Take 100 mg by mouth 2 (two) times daily.    [provider]  nitroGLYCERIN (NITROSTAT) 0.4 MG SL tablet Place 1 tablet (0.4 mg total) under the tongue every 5 (five) minutes x 3 doses as needed for chest pain (if no relief after 3rd dose, proceed to the ED for an evaluation or call 911). 10/04/18   Arnoldo Lenis, MD  oxyCODONE-acetaminophen (PERCOCET) 10-325 MG tablet Take 1 tablet by mouth every 6 (six) hours.    [provider]  pravastatin (PRAVACHOL) 40 MG tablet Take 40 mg by mouth daily. 07/01/19   [provider]  ruxolitinib phosphate (JAKAFI) 5 MG  tablet Take 5 mg by mouth daily.    [provider]  sevelamer carbonate (RENVELA) 800 MG tablet Take 800 mg by mouth 3 (three) times daily with meals. Takes 3 tablets by mouth 3 times daily with meals.    [provider]  traMADol (ULTRAM-ER) 100 MG 24 hr tablet Take 100 mg by mouth every 12 (twelve) hours.    [provider]  traZODone (DESYREL) 50 MG tablet Take 1 tablet (50 mg total) by mouth at bedtime as needed for sleep. 04/21/19   Roxan Hockey, MD    Allergies    Hydrocodone  Review of Systems   Review of Systems  Constitutional: Negative for fever.  HENT: Negative for sore throat and trouble swallowing.   Eyes: Positive for visual disturbance. Negative for pain and redness.  Respiratory: Negative for cough and shortness of breath.   Cardiovascular: Negative for chest pain.  Gastrointestinal: Negative for abdominal pain, nausea and vomiting.  Genitourinary: Negative for flank pain.  Musculoskeletal: Positive for neck pain. Negative for back pain.  Skin: Negative for wound.  Neurological: Positive for headaches. Negative for speech difficulty and numbness.  Hematological: Does not bruise/bleed easily.  Psychiatric/Behavioral: Negative for confusion.    Physical  Exam Updated Vital Signs BP (!) 154/90 (BP Location: Right Arm)   Pulse 79   Resp 16   Ht 1.829 m (6')   Wt 80.3 kg   SpO2 97%   BMI 24.01 kg/m   Physical Exam Vitals and nursing note reviewed.  Constitutional:      Appearance: Normal appearance. He is well-developed.  HENT:     Head: Atraumatic.     Nose: Nose normal.     Mouth/Throat:     Mouth: Mucous membranes are moist.     Pharynx: Oropharynx is clear.  Eyes:     General: No scleral icterus.    Conjunctiva/sclera: Conjunctivae normal.     Pupils: Pupils are equal, round, and reactive to light.     Comments: EOM intact with exception that patient unable to move left eye laterally past midline.   Neck:     Vascular: No carotid bruit.     Trachea: No tracheal deviation.     Comments: Trachea midline.  Cardiovascular:     Rate and Rhythm: Normal rate and regular rhythm.     Pulses: Normal pulses.     Heart sounds: Normal heart sounds. No murmur. No friction rub. No gallop.   Pulmonary:     Effort: Pulmonary effort is normal. No accessory muscle usage or respiratory distress.     Breath sounds: Normal breath sounds.  Chest:     Chest wall: No tenderness.  Abdominal:     General: Bowel sounds are normal. There is no distension.     Palpations: Abdomen is soft.     Tenderness: There is no abdominal tenderness. There is no guarding.  Genitourinary:    Comments: No cva tenderness. Musculoskeletal:        General: No swelling.     Cervical back: Tenderness present.     Comments: Mid cervical tenderness, otherwise, CTLS spine, non tender, aligned, no step off. Good rom bil extremities without pain or focal bony tenderness.   Skin:    General: Skin is warm and dry.     Findings: No rash.  Neurological:     Mental Status: He is alert.     Comments: Alert, speech clear/fluent. GCS 15. Motor intact bil, stre 5/5, no pronator drift. sens  grossly intact bil. Left 6th nerve palsy.   Psychiatric:        Mood and Affect:  Mood normal.     ED Results / Procedures / Treatments   Labs (all labs ordered are listed, but only abnormal results are displayed) Results for orders placed or performed during the hospital encounter of 09/14/19  Respiratory Panel by RT PCR (Flu A&B, Covid) - Nasopharyngeal Swab   Specimen: Nasopharyngeal Swab  Result Value Ref Range   SARS Coronavirus 2 by RT PCR POSITIVE (A) NEGATIVE   Influenza A by PCR NEGATIVE NEGATIVE   Influenza B by PCR NEGATIVE NEGATIVE  CBC  Result Value Ref Range   WBC 8.2 4.0 - 10.5 K/uL   RBC 2.85 (L) 4.22 - 5.81 MIL/uL   Hemoglobin 9.0 (L) 13.0 - 17.0 g/dL   HCT 27.5 (L) 39.0 - 52.0 %   MCV 96.5 80.0 - 100.0 fL   MCH 31.6 26.0 - 34.0 pg   MCHC 32.7 30.0 - 36.0 g/dL   RDW 21.4 (H) 11.5 - 15.5 %   Platelets 67 (L) 150 - 400 K/uL   nRBC 2.2 (H) 0.0 - 0.2 %  Comprehensive metabolic panel  Result Value Ref Range   Sodium 137 135 - 145 mmol/L   Potassium 4.0 3.5 - 5.1 mmol/L   Chloride 102 98 - 111 mmol/L   CO2 25 22 - 32 mmol/L   Glucose, Bld 139 (H) 70 - 99 mg/dL   BUN 45 (H) 8 - 23 mg/dL   Creatinine, Ser 4.19 (H) 0.61 - 1.24 mg/dL   Calcium 8.4 (L) 8.9 - 10.3 mg/dL   Total Protein 5.4 (L) 6.5 - 8.1 g/dL   Albumin 2.7 (L) 3.5 - 5.0 g/dL   AST 37 15 - 41 U/L   ALT 34 0 - 44 U/L   Alkaline Phosphatase 173 (H) 38 - 126 U/L   Total Bilirubin 0.5 0.3 - 1.2 mg/dL   GFR calc non Af Amer 13 (L) >60 mL/min   GFR calc Af Amer 15 (L) >60 mL/min   Anion gap 10 5 - 15  APTT  Result Value Ref Range   aPTT 35 24 - 36 seconds  Protime-INR  Result Value Ref Range   Prothrombin Time 13.5 11.4 - 15.2 seconds   INR 1.0 0.8 - 1.2  CBG monitoring, ED  Result Value Ref Range   Glucose-Capillary 125 (H) 70 - 99 mg/dL   CT Abdomen Pelvis Wo Contrast  Result Date: 08/24/2019 CLINICAL DATA:  Abdominal distension, pain and swelling, end-stage renal disease EXAM: CT ABDOMEN AND PELVIS WITHOUT CONTRAST TECHNIQUE: Multidetector CT imaging of the abdomen and  pelvis was performed following the standard protocol without IV contrast. COMPARISON:  07/03/2019 FINDINGS: Lower chest: There is patchy ground-glass airspace disease most pronounced within the right lower lobe. Small bilateral pleural effusions are noted. Extensive atherosclerosis of the coronary vasculature. Central venous catheter tip at the atrial caval junction. Hepatobiliary: Stable hepatomegaly. Slight nodular contour of the capsule suggests underlying cirrhosis. No focal liver abnormality. The gallbladder is unremarkable. No biliary dilatation. Pancreas: Unremarkable. No pancreatic ductal dilatation or surrounding inflammatory changes. Spleen: Continued splenomegaly measuring up to 14.8 cm in anterior-posterior dimension. Adrenals/Urinary Tract: Bilateral renal cortical atrophy compatible with known end-stage renal disease and dialysis dependence. No urinary tract calculi or obstruction. The bladder is unremarkable. The adrenals are stable. Stomach/Bowel: Small hiatal hernia.  No bowel obstruction or ileus. Vascular/Lymphatic: Stable extensive atherosclerosis of the abdominal aorta and its  branches. No pathologically enlarged lymph nodes. Reproductive: Choose 3 Other: Large volume ascites throughout the abdomen and pelvis, increased since prior study. No evidence of abdominal wall hernia. Musculoskeletal: Bony changes of renal osteodystrophy. No acute fractures. Reconstructed images demonstrate no additional findings. Injection granulomata within the gluteal regions unchanged. IMPRESSION: 1. Large volume ascites, increased since prior study. 2. Persistent hepatosplenomegaly. 3. Patchy ground-glass airspace disease within the right lung base. This is a nonspecific finding, and could reflect atypical pneumonia given recent COVID-19 diagnosis 07/23/2019. 4. Small bilateral pleural effusions. Electronically Signed   By: Randa Ngo M.D.   On: 08/24/2019 08:55   CT HEAD WO CONTRAST  Result Date:  09/14/2019 CLINICAL DATA:  Fall at dialysis with headache EXAM: CT HEAD WITHOUT CONTRAST CT CERVICAL SPINE WITHOUT CONTRAST TECHNIQUE: Multidetector CT imaging of the head and cervical spine was performed following the standard protocol without intravenous contrast. Multiplanar CT image reconstructions of the cervical spine were also generated. COMPARISON:  None. FINDINGS: CT HEAD FINDINGS Brain: Left para median prepontine subarachnoid hemorrhage measuring 11 mm thickness. There is a reported left 6 nerve palsy which correlates with this location. Intraventricular hemorrhage is seen layering in the lateral ventricles, likely intraventricular reflux. There is history of syncope and fall. Encephalomalacia in the inferior right temporal lobe which could be posttraumatic or ischemic. There have been remote small vessel infarcts at the basal ganglia. No hydrocephalus when accounting for atrophy. Vascular: No hyperdense vessel or unexpected calcification. Skull: Normal. Negative for fracture or focal lesion. Sinuses/Orbits: Negative Critical Value/emergent results were called by telephone at the time of interpretation on 09/14/2019 at 10:26 am to provider [facility] , who verbally acknowledged these results. CT CERVICAL SPINE FINDINGS Alignment: No traumatic malalignment Skull base and vertebrae: Negative for acute fracture Soft tissues and spinal canal: No prevertebral fluid or swelling. No visible canal hematoma. Disc levels: Lower cervical disc and upper cervical facet degeneration. Upper chest: Negative IMPRESSION: 1. Subarachnoid hemorrhage in the prepontine cistern that is left eccentric and fairly focal/thick. Reportedly there was preceding syncope and fall. 2. Small volume hemorrhage layering in the lateral ventricles. 3. Negative for cervical spine fracture. 4. Remote small vessel infarcts. Electronically Signed   By: Monte Fantasia M.D.   On: 09/14/2019 10:29   CT CERVICAL SPINE WO CONTRAST  Result Date:  09/14/2019 CLINICAL DATA:  Fall at dialysis with headache EXAM: CT HEAD WITHOUT CONTRAST CT CERVICAL SPINE WITHOUT CONTRAST TECHNIQUE: Multidetector CT imaging of the head and cervical spine was performed following the standard protocol without intravenous contrast. Multiplanar CT image reconstructions of the cervical spine were also generated. COMPARISON:  None. FINDINGS: CT HEAD FINDINGS Brain: Left para median prepontine subarachnoid hemorrhage measuring 11 mm thickness. There is a reported left 6 nerve palsy which correlates with this location. Intraventricular hemorrhage is seen layering in the lateral ventricles, likely intraventricular reflux. There is history of syncope and fall. Encephalomalacia in the inferior right temporal lobe which could be posttraumatic or ischemic. There have been remote small vessel infarcts at the basal ganglia. No hydrocephalus when accounting for atrophy. Vascular: No hyperdense vessel or unexpected calcification. Skull: Normal. Negative for fracture or focal lesion. Sinuses/Orbits: Negative Critical Value/emergent results were called by telephone at the time of interpretation on 09/14/2019 at 10:26 am to provider [facility] , who verbally acknowledged these results. CT CERVICAL SPINE FINDINGS Alignment: No traumatic malalignment Skull base and vertebrae: Negative for acute fracture Soft tissues and spinal canal: No prevertebral fluid or swelling. No visible  canal hematoma. Disc levels: Lower cervical disc and upper cervical facet degeneration. Upper chest: Negative IMPRESSION: 1. Subarachnoid hemorrhage in the prepontine cistern that is left eccentric and fairly focal/thick. Reportedly there was preceding syncope and fall. 2. Small volume hemorrhage layering in the lateral ventricles. 3. Negative for cervical spine fracture. 4. Remote small vessel infarcts. Electronically Signed   By: Monte Fantasia M.D.   On: 09/14/2019 10:29   US Paracentesis  Result Date:  09/09/2019 INDICATION: Ascites. EXAM: ULTRASOUND GUIDED LEFT PARACENTESIS MEDICATIONS: None COMPLICATIONS: None immediate. PROCEDURE: Informed written consent was obtained from the patient after a discussion of the risks, benefits and alternatives to treatment. A timeout was performed prior to the initiation of the procedure. Initial ultrasound scanning demonstrates a large amount of ascites within the left lower abdominal quadrant. The left lower abdomen was prepped and draped in the usual sterile fashion. 1% lidocaine was used for local anesthesia. Following this, an 8 Fr Safe-T-Centesis catheter was introduced. An ultrasound image was saved for documentation purposes. The paracentesis was performed. The catheter was removed and a dressing was applied. The patient tolerated the procedure well without immediate post procedural complication. FINDINGS: A total of approximately 4.6 L of whitish fluid was removed. Samples were sent to the laboratory as requested by the clinical team. IMPRESSION: Successful ultrasound-guided paracentesis yielding 4.6 liters of peritoneal fluid. Electronically Signed   By: Lorriane Shire M.D.   On: 09/09/2019 10:54   US Paracentesis  Result Date: 09/05/2019 INDICATION: Ascites EXAM: ULTRASOUND GUIDED left PARACENTESIS COMPLICATIONS: None immediate. PROCEDURE: Informed written consent was obtained from the patient after a discussion of the risks, benefits and alternatives to treatment. A timeout was performed prior to the initiation of the procedure. Initial ultrasound scanning demonstrates a large amount of ascites within the left lower abdominal quadrant. The left lower abdomen was prepped and draped in the usual sterile fashion. 1% lidocaine was used for local anesthesia. Following this, a yueh catheter was introduced. An ultrasound image was saved for documentation purposes. The paracentesis was performed. The catheter was removed and a dressing was applied. The patient tolerated  the procedure well without immediate post procedural complication. FINDINGS: FINDINGS A total of approximately 7 L of straw-colored fluid was removed. Samples were sent to the laboratory as requested by the clinical team. IMPRESSION: Successful ultrasound-guided paracentesis yielding 7 liters of peritoneal fluid. Electronically Signed   By: Van Clines M.D.   On: 09/05/2019 16:14   US Paracentesis  Result Date: 09/01/2019 INDICATION: Ascites Probable underlying Cirrhosis EXAM: ULTRASOUND GUIDED RLQ PARACENTESIS MEDICATIONS: 10 cc 1% lidocaine COMPLICATIONS: None immediate. PROCEDURE: Informed written consent was obtained from the patient after a discussion of the risks, benefits and alternatives to treatment. A timeout was performed prior to the initiation of the procedure. Initial ultrasound scanning demonstrates a large amount of ascites within the right lower abdominal quadrant. The right lower abdomen was prepped and draped in the usual sterile fashion. 1% lidocaine was used for local anesthesia. Following this, a 19 g Yueh catheter was introduced. An ultrasound image was saved for documentation purposes. The paracentesis was performed. The catheter was removed and a dressing was applied. The patient tolerated the procedure well without immediate post procedural complication. Patient received post-procedure intravenous albumin; see nursing notes for details. FINDINGS: A total of approximately 4 liters of yellow fluid was removed. Samples were sent to the laboratory as requested by the clinical team. IMPRESSION: Successful ultrasound-guided paracentesis yielding 4 liters of peritoneal fluid. Read by Olin Hauser  A [facility] Electronically Signed   By: Lavonia Dana M.D.   On: 09/01/2019 13:58   IR Removal Tun Cv Cath W/O FL  Result Date: 09/06/2019 INDICATION: Patient with history of end-stage renal disease on hemodialysis previously via tunneled right IJ hemodialysis catheter, now with working AV  fistula. Request to IR for removal of tunneled HD catheter. EXAM: REMOVAL OF TUNNELED HEMODIALYSIS CATHETER MEDICATIONS: 6 mL 1% lidocaine COMPLICATIONS: None immediate. PROCEDURE: Informed written consent was obtained from the patient following an explanation of the procedure, risks, benefits and alternatives to treatment. A time out was performed prior to the initiation of the procedure. Maximal barrier sterile technique was utilized including caps, mask, sterile gowns, sterile gloves, large sterile drape, hand hygiene, and Hibiclens. 1% lidocaine was injected under sterile conditions along the subcutaneous tunnel. Utilizing a combination of blunt dissection and gentle traction, the catheter was removed intact. Hemostasis was obtained with manual compression. A dressing was placed. The patient tolerated the procedure well without immediate post procedural complication. IMPRESSION: Successful removal of tunneled dialysis catheter. Read by Candiss Norse, PA-C Electronically Signed   By: Jerilynn Mages.  Shick M.D.   On: 09/06/2019 15:15   DG Chest Port 1 View  Result Date: 08/24/2019 CLINICAL DATA:  Chest pain in a dialysis patient EXAM: PORTABLE CHEST 1 VIEW COMPARISON:  07/23/2019 FINDINGS: Perma catheter on the right with tip at the right atrium, unchanged. Low volume chest with interstitial prominence. Borderline heart size. No effusion or pneumothorax. IMPRESSION: Low volume chest with vascular congestion. Airspace opacity has improved from 07/23/2019. Electronically Signed   By: Monte Fantasia M.D.   On: 08/24/2019 05:46    EKG EKG Interpretation  Date/Time:  Wednesday September 14 2019 09:06:38 EST Ventricular Rate:  87 PR Interval:    QRS Duration: 94 QT Interval:  390 QTC Calculation: 470 R Axis:   13 Text Interpretation: Sinus rhythm Nonspecific ST abnormality `mild st dep inf/lat noted on prior ecgs. Baseline wander Confirmed by Lajean Saver [id number]) on 09/14/2019 9:23:08 AM   Radiology CT Angio  Head W/Cm &/Or Wo Cm  Result Date: 09/14/2019 CLINICAL DATA:  Subarachnoid hemorrhage [facility]) suspected. Additional history provided: Patient fell 2 weeks ago and again 2 days ago, does not remember if head trauma, denies loss of consciousness, double vision and headaches. EXAM: CT ANGIOGRAPHY HEAD TECHNIQUE: Multidetector CT imaging of the head was performed using the standard protocol during bolus administration of intravenous contrast. Multiplanar CT image reconstructions and MIPs were obtained to evaluate the vascular anatomy. CONTRAST:  163m OMNIPAQUE IOHEXOL 350 MG/ML SOLN COMPARISON:  Noncontrast head CT performed earlier the same day prior noncontrast head CT examinations 09/14/2019 and earlier FINDINGS: CT HEAD Brain: Again demonstrated is subarachnoid hemorrhage within the prepontine cistern, eccentric to the left. The hemorrhage has not significantly changed in size or extent from head CT performed earlier the same day, again measuring up to 15 mm in greatest thickness (remeasured on prior). Unchanged mild mass effect upon the pons. Unchanged small volume intraventricular hemorrhage within the dependent lateral ventricles. The ventricles are unchanged in size and configuration without evidence of hydrocephalus. Redemonstrated focus of encephalomalacia within the anterolateral right temporal lobe, which may be posttraumatic or post ischemic in etiology. 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 Ill-defined hypodensity within the cerebral white matter is nonspecific, but consistent with chronic small vessel ischemic disease. Stable, mild generalized parenchymal atrophy. Vascular: Reported separately. Skull: Normal. Negative for fracture or focal lesion. Sinuses: No significant paranasal  sinus disease or mastoid effusion at the imaged levels. Orbits: Visualized orbits demonstrate no acute abnormality. CTA HEAD Anterior circulation: The intracranial internal carotid arteries are  patent with prominent atherosclerotic calcification. Sites of mild to moderate stenosis within the right ICA. Sites of narrowing within the left ICA, greatest within the paraclinoid segment (at least moderate in severity at this site). The M1 middle cerebral arteries are patent without significant stenosis. No M2 proximal branch occlusion or high-grade proximal stenosis is identified. The anterior cerebral arteries are patent without high-grade proximal stenosis. No anterior circulation aneurysm is identified Posterior circulation: The non dominant intracranial right vertebral artery is developmentally diminutive beyond the origin of the right PICA, although patent. The dominant intracranial left vertebral artery is patent. Calcified plaque in the V4 left vertebral artery is incompletely imaged but contributes to moderate stenosis at the visualized levels. The basilar artery is patent without significant stenosis. The posterior cerebral arteries are patent bilaterally. Mild narrowing within the proximal P2 right PCA. There is a sizable left posterior communicating artery with hypoplastic left P1 segment. A right posterior communicating artery is not definitive identified. No posterior circulation aneurysm is identified. Venous sinuses: Within limitations of contrast timing, no convincing thrombus. Anatomic variants: As described IMPRESSION: CT head: 1. Unchanged prepontine subarachnoid hemorrhage and small volume hemorrhage layering within the lateral ventricles. No evidence of interval hemorrhage. No evidence of hydrocephalus on the current exam. 2. Additional chronic findings without interval change as described. CTA head: 1. No intracranial aneurysm is identified at the imaged levels. 2. Intracranial atherosclerotic disease with multifocal stenoses, most notably as follows. 3. Prominent calcified plaque within the intracranial internal carotid arteries bilaterally. Sites of mild/moderate stenosis within the right  ICA. Narrowing of the intracranial left ICA is greatest within the left paraclinoid segment, at least moderate at this site. 4. Calcified plaque results in moderate stenosis of the dominant V4 left vertebral artery. Electronically Signed   By: Kellie Simmering DO   On: 09/14/2019 12:38   CT HEAD WO CONTRAST  Result Date: 09/14/2019 CLINICAL DATA:  Fall at dialysis with headache EXAM: CT HEAD WITHOUT CONTRAST CT CERVICAL SPINE WITHOUT CONTRAST TECHNIQUE: Multidetector CT imaging of the head and cervical spine was performed following the standard protocol without intravenous contrast. Multiplanar CT image reconstructions of the cervical spine were also generated. COMPARISON:  None. FINDINGS: CT HEAD FINDINGS Brain: Left para median prepontine subarachnoid hemorrhage measuring 11 mm thickness. There is a reported left 6 nerve palsy which correlates with this location. Intraventricular hemorrhage is seen layering in the lateral ventricles, likely intraventricular reflux. There is history of syncope and fall. Encephalomalacia in the inferior right temporal lobe which could be posttraumatic or ischemic. There have been remote small vessel infarcts at the basal ganglia. No hydrocephalus when accounting for atrophy. Vascular: No hyperdense vessel or unexpected calcification. Skull: Normal. Negative for fracture or focal lesion. Sinuses/Orbits: Negative Critical Value/emergent results were called by telephone at the time of interpretation on 09/14/2019 at 10:26 am to provider [facility] , who verbally acknowledged these results. CT CERVICAL SPINE FINDINGS Alignment: No traumatic malalignment Skull base and vertebrae: Negative for acute fracture Soft tissues and spinal canal: No prevertebral fluid or swelling. No visible canal hematoma. Disc levels: Lower cervical disc and upper cervical facet degeneration. Upper chest: Negative IMPRESSION: 1. Subarachnoid hemorrhage in the prepontine cistern that is left eccentric and  fairly focal/thick. Reportedly there was preceding syncope and fall. 2. Small volume hemorrhage layering in the lateral ventricles. 3.  Negative for cervical spine fracture. 4. Remote small vessel infarcts. Electronically Signed   By: Monte Fantasia M.D.   On: 09/14/2019 10:29   CT CERVICAL SPINE WO CONTRAST  Result Date: 09/14/2019 CLINICAL DATA:  Fall at dialysis with headache EXAM: CT HEAD WITHOUT CONTRAST CT CERVICAL SPINE WITHOUT CONTRAST TECHNIQUE: Multidetector CT imaging of the head and cervical spine was performed following the standard protocol without intravenous contrast. Multiplanar CT image reconstructions of the cervical spine were also generated. COMPARISON:  None. FINDINGS: CT HEAD FINDINGS Brain: Left para median prepontine subarachnoid hemorrhage measuring 11 mm thickness. There is a reported left 6 nerve palsy which correlates with this location. Intraventricular hemorrhage is seen layering in the lateral ventricles, likely intraventricular reflux. There is history of syncope and fall. Encephalomalacia in the inferior right temporal lobe which could be posttraumatic or ischemic. There have been remote small vessel infarcts at the basal ganglia. No hydrocephalus when accounting for atrophy. Vascular: No hyperdense vessel or unexpected calcification. Skull: Normal. Negative for fracture or focal lesion. Sinuses/Orbits: Negative Critical Value/emergent results were called by telephone at the time of interpretation on 09/14/2019 at 10:26 am to provider [facility] , who verbally acknowledged these results. CT CERVICAL SPINE FINDINGS Alignment: No traumatic malalignment Skull base and vertebrae: Negative for acute fracture Soft tissues and spinal canal: No prevertebral fluid or swelling. No visible canal hematoma. Disc levels: Lower cervical disc and upper cervical facet degeneration. Upper chest: Negative IMPRESSION: 1. Subarachnoid hemorrhage in the prepontine cistern that is left eccentric and  fairly focal/thick. Reportedly there was preceding syncope and fall. 2. Small volume hemorrhage layering in the lateral ventricles. 3. Negative for cervical spine fracture. 4. Remote small vessel infarcts. Electronically Signed   By: Monte Fantasia M.D.   On: 09/14/2019 10:29   US Paracentesis  Result Date: 09/14/2019 INDICATION: Cirrhosis Recurrent ascites EXAM: ULTRASOUND GUIDED  PARACENTESIS MEDICATIONS: 10 cc 1% lidocaine COMPLICATIONS: None immediate. PROCEDURE: Informed written consent was obtained from the patient after a discussion of the risks, benefits and alternatives to treatment. A timeout was performed prior to the initiation of the procedure. Initial ultrasound scanning demonstrates a large amount of ascites within the right lower abdominal quadrant. The right lower abdomen was prepped and draped in the usual sterile fashion. 1% lidocaine was used for local anesthesia. Following this, a 19 g Yueh catheter was introduced. An ultrasound image was saved for documentation purposes. The paracentesis was performed. The catheter was removed and a dressing was applied. The patient tolerated the procedure well without immediate post procedural complication. FINDINGS: A total of approximately 4.3 liters of cloudy milky fluid was removed. Samples were sent to the laboratory as requested by the clinical team. IMPRESSION: Successful ultrasound-guided paracentesis yielding 4.3 liters of peritoneal fluid. Read by Lavonia Drafts [facility] Electronically Signed   By: Lavonia Dana M.D.   On: 09/14/2019 09:07    Procedures Procedures (including critical care time)  Medications Ordered in ED Medications  morphine 4 MG/ML injection 4 mg (has no administration in time range)  ondansetron (ZOFRAN) injection 4 mg (has no administration in time range)    ED Course  I have reviewed the triage vital signs and the nursing notes.  Pertinent labs & imaging results that were available during my care of the patient  were reviewed by me and considered in my medical decision making (see chart for details).    MDM Rules/Calculators/A&P  Iv ns. Stat ct. Labs. Continuous pulse ox and monitor. Ecg.   Patient requests med for pain/headache. Morphine iv.   Reviewed nursing notes and prior charts for additional history.   Labs reviewed/interpreted by me - chem normal. Wbc normal. plt low. CRF.  CT reviewed/interpreted by me - +SAH, and lateral vent IVH.  Neurology consulted - discussed pt, CT with Dr Merlene Laughter - he recommends admission to medical service at [facility] [place] consult. States likely from trauma. States as part of inpatient workup could also get CTA.  Neurosurgery consulted.   Given multiple medical issues, dialysis, thrombocytopenia, etc, will consult hospitalist service for admission and transfer to [facility].   Neurochecks. No change in exam from initial. Requests additional pain medication. Dilaudid .5 mg iv.   COVID swab sent.   Discussed with Dr Arnoldo Morale, neurosurgeon on call - he reviewed ct, and indicates admit to hospitalist at [facility], ideally [facility] progressive care, to order CTA, and he will see patient at [place].  CTA ordered. Hospitalists consulted.   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  Performed by: Mirna Mires Total critical care time: 145 minutes Critical care time was exclusive of separately billable procedures and treating other patients. Critical care was necessary to treat or prevent imminent or life-threatening deterioration. Critical care was time spent personally by me on the following activities: development of treatment plan with patient and/or surrogate as well as nursing, discussions with consultants, evaluation of patient's response to treatment, examination of patient, obtaining history from patient or surrogate, ordering and performing treatments  and interventions, ordering and review of laboratory studies, ordering and review of radiographic studies, pulse oximetry and re-evaluation of patient's condition.  Pain persists, dilaudid iv.   Recheck pain improved. Additional neurochecks - exam c/w prior.  Additional imaging reviewed/interpreted by me - cta neg for aneurysm.   Additional labs/reviewed/interpreted by me - covid positive. Airborne and droplet precautions maintained.   ISIAIH HOLLENBACH was evaluated in Emergency Department on 09/14/2019 for the symptoms described in the history of present illness. He was evaluated in the context of the global COVID-19 pandemic, which necessitated consideration that the patient might be at risk for infection with the SARS-CoV-2 virus that causes COVID-19. Institutional protocols and algorithms that pertain to the evaluation of patients at risk for COVID-19 are in a state of rapid change based on information released by regulatory bodies including the CDC and federal and state organizations. These policies and algorithms were followed during the patient's care in the ED.     Final Clinical Impression(s) / ED Diagnoses Final diagnoses:  None    Rx / DC Orders ED Discharge Orders    None           Lajean Saver, MD 09/14/19 1438

## 2019-09-14 NOTE — ED Notes (Signed)
[facility] at bedside 

## 2019-09-14 NOTE — ED Notes (Signed)
Contacted pt placement on bed status. Pt placement reported pending discharges but no current beds available on unit pt pended to.Pt and ED Agricultural consultant.

## 2019-09-14 NOTE — ED Notes (Signed)
Per pharmacy, Shanon Brow, is not available in hospital formulary and pt would need to take home dose. Pt daughter, Opal Sidles, contacted and reported would bring home medication to hospital.

## 2019-09-14 NOTE — Procedures (Signed)
   US guided RLQ paracentesis  4.3 liters cloudy milky color Sent for labs per MD  Tolerated well  Of note: during procedure pt complained of some blurred vision this am Only left eye Says he noticed onset this am Has never happened before  I spoke to Daughter Opal Sidles She says he did mention this to her this morning on way to hospital She also relates he has been very forgetful and slightly confused for 2 months-- worsening in last few days.  Exam: grip strength = Face symmetrical Smile = Tongue midline  Left eye EOM limited-- does not cross midline to far left Rt eye EOM wnl  Pt started to complain of headache Ended paracentesis procedure now  Pt to ED for evaluation Dtr is aware

## 2019-09-14 NOTE — Consult Note (Signed)
Reason for Consult: Intracranial hemorrhage Referring Physician: Dr. Otilio Connors is an 74 y.o. male.  HPI: The patient is a 74 year old white male with multiple medical problems including end-stage renal disease and hemodialysis.  The patient fell on 09/12/2019.  He denies syncope or loss of consciousness.  He does not feel he struck his head significantly.  He presented to the [facility], ER with a headache today.  He was noted to have a 6th nerve palsy.  Dr. Delice Lesch all worked him up with a head CT which demonstrated a posterior fossa subarachnoid hemorrhage.  The patient was transferred to [facility] for observation.  A neurosurgical consultation is requested.  Presently the patient complains of headaches and some neck pain.  He has had no nausea, vomiting, seizures, loss of consciousness, etc.  He does admit to double vision.  Past Medical History:  Diagnosis Date  . Anxiety   . Arthritis   . BPH (benign prostatic hyperplasia)   . CAD (coronary artery disease)    DES to circumflex 07/2016, moderate residual LAD and RCA, small 90% OM3 - managed medically  . CHF (congestive heart failure) ([place])   . Chronic lower back pain   . Depression   . ESRD (end stage renal disease) ([place])    Hemo MWF [facility]  . Essential hypertension   . Gastritis   . GERD (gastroesophageal reflux disease)   . History of kidney stones   . Leukemia ([place])   . Leukocytosis   . 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 Pleural effusion    had [facility]ancentesis at [facility]  . Pneumonia    last time 08/2018- Aspiration Pneumonia - unintentional opiate overdose  . Renal insufficiency   . Spleen enlarged   . Thrombocythemia ([place])   . Type II diabetes mellitus ([place])     Past Surgical History:  Procedure Laterality Date  . AV FISTULA PLACEMENT Left 04/14/2019   Procedure: ARTERIOVENOUS (AV) FISTULA CREATION LEFT  ARM;  Surgeon: Rosetta Posner, MD;  Location: [place];  Service: Vascular;  Laterality: Left;  . BIOPSY  12/08/2017   Procedure: BIOPSY;  Surgeon: Danie Binder, MD;  [facility];  Service: Endoscopy;;  gastric  . BONE MARROW BIOPSY     x 3 times  . CARDIAC CATHETERIZATION N/A 08/12/2016   Procedure: Left Heart Cath and Coronary Angiography;  Surgeon: Jettie Booze, MD;  Location: [place] CV LAB;  Service: Cardiovascular;  Laterality: N/A;  . CARDIAC CATHETERIZATION N/A 08/12/2016   Procedure: Coronary Stent Intervention;  Surgeon: Jettie Booze, MD;  Location: [place] CV LAB;  Service: Cardiovascular;  Laterality: N/A;  . CARDIAC CATHETERIZATION  2000s X 1  . COLONOSCOPY WITH PROPOFOL N/A 02/01/2013   SLF: 1. 23 colon polyps removed. 2 retrieved. 2. Mild diverticulosis in teh sigmoid colon 3. Small internal hemorrhoids 4. The colon is redundant   . COLONOSCOPY WITH PROPOFOL N/A 12/08/2017   Procedure: COLONOSCOPY WITH PROPOFOL;  Surgeon: Danie Binder, MD;  [facility];  Service: Endoscopy;  Laterality: N/A;  1:45pm  . CYSTOSCOPY W/ STONE MANIPULATION    . ESOPHAGOGASTRODUODENOSCOPY (EGD) WITH PROPOFOL N/A 02/01/2013   SLF: 1. Moderate erosive gastritis  . ESOPHAGOGASTRODUODENOSCOPY (EGD) WITH PROPOFOL N/A 12/08/2017   Procedure: ESOPHAGOGASTRODUODENOSCOPY (EGD) WITH PROPOFOL;  Surgeon: Danie Binder, MD;  [facility];  Service: Endoscopy;  Laterality: N/A;  . IR REMOVAL TUN CV CATH W/O FL  09/06/2019  . [place]  . POLYPECTOMY N/A 02/01/2013   Procedure: POLYPECTOMY;  Surgeon: Danie Binder, MD;  Location: [facility] OR[facility];  Service: Endoscopy;  Laterality: N/A;    Family History  Problem Relation Age of Onset  . Heart attack Mother   . CVA Mother   . Heart attack Father   . Colon cancer Neg Hx     Social History:  reports that he quit smoking about 6 years ago. His smoking use included cigars. He started smoking about 44  years ago. He has a 20.00 pack-year smoking history. He has never used smokeless tobacco. He reports previous alcohol use. He reports that he does not use drugs.  Allergies:  Allergies  Allergen Reactions  . Hydrocodone Itching    Medications:  I have reviewed the patient's current medications. Prior to Admission:  Medications Prior to Admission  Medication Sig Dispense Refill Last Dose  . acetaminophen (TYLENOL) 500 MG tablet Take 1,000 mg by mouth 2 (two) times daily as needed for mild pain or moderate pain.      . DULoxetine (CYMBALTA) 60 MG capsule Take 1 capsule (60 mg total) by mouth daily. 30 capsule 3 09/14/2019 at 0800  . labetalol (NORMODYNE) 100 MG tablet Take 100 mg by mouth 2 (two) times daily.   09/14/2019 at 0630  . nitroGLYCERIN (NITROSTAT) 0.4 MG SL tablet Place 1 tablet (0.4 mg total) under the tongue every 5 (five) minutes x 3 doses as needed for chest pain (if no relief after 3rd dose, proceed to the ED for an evaluation or call 911). 25 tablet 3   . oxyCODONE-acetaminophen (PERCOCET) 10-325 MG tablet Take 1 tablet by mouth every 6 (six) hours.   09/14/2019 at Unknown time  . pravastatin (PRAVACHOL) 40 MG tablet Take 40 mg by mouth daily.   09/14/2019 at Unknown time  . ruxolitinib phosphate (JAKAFI) 5 MG tablet Take 5 mg by mouth daily.   09/14/2019 at Unknown time  . sevelamer carbonate (RENVELA) 800 MG tablet Take 800 mg by mouth 3 (three) times daily with meals. Takes 3 tablets by mouth 3 times daily with meals.   09/14/2019 at Unknown time  . traZODone (DESYREL) 50 MG tablet Take 1 tablet (50 mg total) by mouth at bedtime as needed for sleep. 30 tablet 3 09/13/2019 at Unknown time  . HYDROcodone-acetaminophen (NORCO/VICODIN) 5-325 MG tablet Take 1 tablet by mouth 3 (three) times daily.   Not Taking at Unknown time  . traMADol (ULTRAM-ER) 100 MG 24 hr tablet Take 100 mg by mouth every 12 (twelve) hours.   Not Taking at Unknown time   Scheduled: . [START ON 09/15/2019]  DULoxetine  60 mg Oral Daily  . insulin aspart  0-5 Units Subcutaneous QHS  . insulin aspart  0-6 Units Subcutaneous TID WC  . labetalol  100 mg Oral BID  . oxyCODONE-acetaminophen  1 tablet Oral Q6H WA   And  . oxyCODONE  5 mg Oral Q6H WA  . [START ON 09/15/2019] pravastatin  40 mg Oral q1800  . ruxolitinib phosphate  5 mg Oral Daily  . sevelamer carbonate  800 mg Oral TID WC  . sodium chloride flush  3 mL Intravenous Q12H  . traMADol  50 mg Oral Q6H   Continuous: . sodium chloride     JKK:XFGHWE chloride, acetaminophen **OR** acetaminophen, fentaNYL (SUBLIMAZE) injection, ondansetron **OR** ondansetron (ZOFRAN) IV, sodium chloride flush,  traZODone Anti-infectives (From admission, onward)   None       Results for orders placed or performed during the hospital encounter of 09/14/19 (from the past 48 hour(s))  CBG monitoring, ED     Status: Abnormal   Collection Time: 09/14/19  9:06 AM  Result Value Ref Range   Glucose-Capillary 125 (H) 70 - 99 mg/dL    Comment: Glucose reference range applies only to samples taken after fasting for at least 8 hours.  CBC     Status: Abnormal   Collection Time: 09/14/19  9:28 AM  Result Value Ref Range   WBC 8.2 4.0 - 10.5 K/uL   RBC 2.85 (L) 4.22 - 5.81 MIL/uL   Hemoglobin 9.0 (L) 13.0 - 17.0 g/dL   HCT 27.5 (L) 39.0 - 52.0 %   MCV 96.5 80.0 - 100.0 fL   MCH 31.6 26.0 - 34.0 pg   MCHC 32.7 30.0 - 36.0 g/dL   RDW 21.4 (H) 11.5 - 15.5 %   Platelets 67 (L) 150 - 400 K/uL    Comment: PLATELET COUNT CONFIRMED BY SMEAR SPECIMEN CHECKED FOR CLOTS Immature Platelet Fraction may be clinically indicated, consider ordering this additional test [id number]    nRBC 2.2 (H) 0.0 - 0.2 %    Comment: Performed at [facility], [address] [place], [place] [address]  Comprehensive metabolic panel     Status: Abnormal   Collection Time: 09/14/19  9:28 AM  Result Value Ref Range   Sodium 137 135 - 145 mmol/L   Potassium 4.0 3.5 - 5.1 mmol/L    Chloride 102 98 - 111 mmol/L   CO2 25 22 - 32 mmol/L   Glucose, Bld 139 (H) 70 - 99 mg/dL    Comment: Glucose reference range applies only to samples taken after fasting for at least 8 hours.   BUN 45 (H) 8 - 23 mg/dL   Creatinine, Ser 4.19 (H) 0.61 - 1.24 mg/dL   Calcium 8.4 (L) 8.9 - 10.3 mg/dL   Total Protein 5.4 (L) 6.5 - 8.1 g/dL   Albumin 2.7 (L) 3.5 - 5.0 g/dL   AST 37 15 - 41 U/L   ALT 34 0 - 44 U/L   Alkaline Phosphatase 173 (H) 38 - 126 U/L   Total Bilirubin 0.5 0.3 - 1.2 mg/dL   GFR calc non Af Amer 13 (L) >60 mL/min   GFR calc Af Amer 15 (L) >60 mL/min   Anion gap 10 5 - 15    Comment: Performed at [facility], [address], [place], [place] [address]  APTT     Status: None   Collection Time: 09/14/19  9:28 AM  Result Value Ref Range   aPTT 35 24 - 36 seconds    Comment: Performed at [facility], [address], [place], [place] [address]  Protime-INR     Status: None   Collection Time: 09/14/19  9:28 AM  Result Value Ref Range   Prothrombin Time 13.5 11.4 - 15.2 seconds   INR 1.0 0.8 - 1.2    Comment: (NOTE) INR goal varies based on device and disease states. Performed at [facility], [address] [place], [place] [address]   Respiratory Panel by RT PCR (Flu A&B, Covid) - Nasopharyngeal Swab     Status: Abnormal   Collection Time: 09/14/19 11:35 AM   Specimen: Nasopharyngeal Swab  Result Value Ref Range   SARS Coronavirus 2 by RT PCR POSITIVE (A) NEGATIVE    Comment: RESULT  CALLED TO, READ BACK BY AND VERIFIED WITH: KENDRICK J. AT [id number] ON [id number] BY THOMPSON S. (NOTE) SARS-CoV-2 target nucleic acids are DETECTED. SARS-CoV-2 RNA is generally detectable in upper respiratory specimens  during the acute phase of infection. Positive results are indicative of the presence of the identified virus, but do not rule out bacterial infection or co-infection with other pathogens not detected by the test. Clinical correlation with patient history and other diagnostic  information is necessary to determine patient infection status. The expected result is Negative. Fact Sheet for Patients:  PinkCheek.be Fact Sheet for Healthcare Providers: GravelBags.it This test is not yet approved or cleared by the [place] FDA and  has been authorized for detection and/or diagnosis of SARS-CoV-2 by FDA under an Emergency Use Authorization (EUA).  This EUA will remain in effect (meaning this test can b e used) for the duration of  the COVID-19 declaration under Section 564(b)(1) of the Act, 21 U.S.C. section 360bbb-3(b)(1), unless the authorization is terminated or revoked sooner.    Influenza A by PCR NEGATIVE NEGATIVE   Influenza B by PCR NEGATIVE NEGATIVE    Comment: (NOTE) The Xpert Xpress SARS-CoV-2/FLU/RSV assay is intended as an aid in  the diagnosis of influenza from Nasopharyngeal swab specimens and  should not be used as a sole basis for treatment. Nasal washings and  aspirates are unacceptable for Xpert Xpress SARS-CoV-2/FLU/RSV  testing. Fact Sheet for Patients: PinkCheek.be Fact Sheet for Healthcare Providers: GravelBags.it This test is not yet approved or cleared by the [place] FDA and  has been authorized for detection and/or diagnosis of SARS-CoV-2 by  FDA under an Emergency Use Authorization (EUA). This EUA will remain  in effect (meaning this test can be used) for the duration of the  Covid-19 declaration under Section 564(b)(1) of the Act, 21  U.S.C. section 360bbb-3(b)(1), unless the authorization is  terminated or revoked. Performed at [facility], [address] [place], [place] [address]     CT Angio Head W/Cm &/Or Wo Cm  Result Date: 09/14/2019 CLINICAL DATA:  Subarachnoid hemorrhage [facility]) suspected. Additional history provided: Patient fell 2 weeks ago and again 2 days ago, does not remember if head trauma,  denies loss of consciousness, double vision and headaches. EXAM: CT ANGIOGRAPHY HEAD TECHNIQUE: Multidetector CT imaging of the head was performed using the standard protocol during bolus administration of intravenous contrast. Multiplanar CT image reconstructions and MIPs were obtained to evaluate the vascular anatomy. CONTRAST:  124m OMNIPAQUE IOHEXOL 350 MG/ML SOLN COMPARISON:  Noncontrast head CT performed earlier the same day prior noncontrast head CT examinations 09/14/2019 and earlier FINDINGS: CT HEAD Brain: Again demonstrated is subarachnoid hemorrhage within the prepontine cistern, eccentric to the left. The hemorrhage has not significantly changed in size or extent from head CT performed earlier the same day, again measuring up to 15 mm in greatest thickness (remeasured on prior). Unchanged mild mass effect upon the pons. Unchanged small volume intraventricular hemorrhage within the dependent lateral ventricles. The ventricles are unchanged in size and configuration without evidence of hydrocephalus. Redemonstrated focus of encephalomalacia within the anterolateral right temporal lobe, which may be posttraumatic or post ischemic in etiology. 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 Ill-defined hypodensity within the cerebral white matter is nonspecific, but consistent with chronic small vessel ischemic disease. Stable, mild generalized parenchymal atrophy. Vascular: Reported separately. Skull: Normal. Negative for fracture or focal lesion. Sinuses: No significant paranasal sinus disease or mastoid effusion at the imaged levels. Orbits:  Visualized orbits demonstrate no acute abnormality. CTA HEAD Anterior circulation: The intracranial internal carotid arteries are patent with prominent atherosclerotic calcification. Sites of mild to moderate stenosis within the right ICA. Sites of narrowing within the left ICA, greatest within the paraclinoid segment (at least moderate in  severity at this site). The M1 middle cerebral arteries are patent without significant stenosis. No M2 proximal branch occlusion or high-grade proximal stenosis is identified. The anterior cerebral arteries are patent without high-grade proximal stenosis. No anterior circulation aneurysm is identified Posterior circulation: The non dominant intracranial right vertebral artery is developmentally diminutive beyond the origin of the right PICA, although patent. The dominant intracranial left vertebral artery is patent. Calcified plaque in the V4 left vertebral artery is incompletely imaged but contributes to moderate stenosis at the visualized levels. The basilar artery is patent without significant stenosis. The posterior cerebral arteries are patent bilaterally. Mild narrowing within the proximal P2 right PCA. There is a sizable left posterior communicating artery with hypoplastic left P1 segment. A right posterior communicating artery is not definitive identified. No posterior circulation aneurysm is identified. Venous sinuses: Within limitations of contrast timing, no convincing thrombus. Anatomic variants: As described IMPRESSION: CT head: 1. Unchanged prepontine subarachnoid hemorrhage and small volume hemorrhage layering within the lateral ventricles. No evidence of interval hemorrhage. No evidence of hydrocephalus on the current exam. 2. Additional chronic findings without interval change as described. CTA head: 1. No intracranial aneurysm is identified at the imaged levels. 2. Intracranial atherosclerotic disease with multifocal stenoses, most notably as follows. 3. Prominent calcified plaque within the intracranial internal carotid arteries bilaterally. Sites of mild/moderate stenosis within the right ICA. Narrowing of the intracranial left ICA is greatest within the left paraclinoid segment, at least moderate at this site. 4. Calcified plaque results in moderate stenosis of the dominant V4 left vertebral  artery. Electronically Signed   By: Kellie Simmering DO   On: 09/14/2019 12:38   CT HEAD WO CONTRAST  Result Date: 09/14/2019 CLINICAL DATA:  Fall at dialysis with headache EXAM: CT HEAD WITHOUT CONTRAST CT CERVICAL SPINE WITHOUT CONTRAST TECHNIQUE: Multidetector CT imaging of the head and cervical spine was performed following the standard protocol without intravenous contrast. Multiplanar CT image reconstructions of the cervical spine were also generated. COMPARISON:  None. FINDINGS: CT HEAD FINDINGS Brain: Left para median prepontine subarachnoid hemorrhage measuring 11 mm thickness. There is a reported left 6 nerve palsy which correlates with this location. Intraventricular hemorrhage is seen layering in the lateral ventricles, likely intraventricular reflux. There is history of syncope and fall. Encephalomalacia in the inferior right temporal lobe which could be posttraumatic or ischemic. There have been remote small vessel infarcts at the basal ganglia. No hydrocephalus when accounting for atrophy. Vascular: No hyperdense vessel or unexpected calcification. Skull: Normal. Negative for fracture or focal lesion. Sinuses/Orbits: Negative Critical Value/emergent results were called by telephone at the time of interpretation on 09/14/2019 at 10:26 am to provider [facility] , who verbally acknowledged these results. CT CERVICAL SPINE FINDINGS Alignment: No traumatic malalignment Skull base and vertebrae: Negative for acute fracture Soft tissues and spinal canal: No prevertebral fluid or swelling. No visible canal hematoma. Disc levels: Lower cervical disc and upper cervical facet degeneration. Upper chest: Negative IMPRESSION: 1. Subarachnoid hemorrhage in the prepontine cistern that is left eccentric and fairly focal/thick. Reportedly there was preceding syncope and fall. 2. Small volume hemorrhage layering in the lateral ventricles. 3. Negative for cervical spine fracture. 4. Remote small vessel infarcts.  Electronically Signed   By: Monte Fantasia M.D.   On: 09/14/2019 10:29   CT CERVICAL SPINE WO CONTRAST  Result Date: 09/14/2019 CLINICAL DATA:  Fall at dialysis with headache EXAM: CT HEAD WITHOUT CONTRAST CT CERVICAL SPINE WITHOUT CONTRAST TECHNIQUE: Multidetector CT imaging of the head and cervical spine was performed following the standard protocol without intravenous contrast. Multiplanar CT image reconstructions of the cervical spine were also generated. COMPARISON:  None. FINDINGS: CT HEAD FINDINGS Brain: Left para median prepontine subarachnoid hemorrhage measuring 11 mm thickness. There is a reported left 6 nerve palsy which correlates with this location. Intraventricular hemorrhage is seen layering in the lateral ventricles, likely intraventricular reflux. There is history of syncope and fall. Encephalomalacia in the inferior right temporal lobe which could be posttraumatic or ischemic. There have been remote small vessel infarcts at the basal ganglia. No hydrocephalus when accounting for atrophy. Vascular: No hyperdense vessel or unexpected calcification. Skull: Normal. Negative for fracture or focal lesion. Sinuses/Orbits: Negative Critical Value/emergent results were called by telephone at the time of interpretation on 09/14/2019 at 10:26 am to provider [facility] , who verbally acknowledged these results. CT CERVICAL SPINE FINDINGS Alignment: No traumatic malalignment Skull base and vertebrae: Negative for acute fracture Soft tissues and spinal canal: No prevertebral fluid or swelling. No visible canal hematoma. Disc levels: Lower cervical disc and upper cervical facet degeneration. Upper chest: Negative IMPRESSION: 1. Subarachnoid hemorrhage in the prepontine cistern that is left eccentric and fairly focal/thick. Reportedly there was preceding syncope and fall. 2. Small volume hemorrhage layering in the lateral ventricles. 3. Negative for cervical spine fracture. 4. Remote small vessel infarcts.  Electronically Signed   By: Monte Fantasia M.D.   On: 09/14/2019 10:29   US Paracentesis  Result Date: 09/14/2019 INDICATION: Cirrhosis Recurrent ascites EXAM: ULTRASOUND GUIDED  PARACENTESIS MEDICATIONS: 10 cc 1% lidocaine COMPLICATIONS: None immediate. PROCEDURE: Informed written consent was obtained from the patient after a discussion of the risks, benefits and alternatives to treatment. A timeout was performed prior to the initiation of the procedure. Initial ultrasound scanning demonstrates a large amount of ascites within the right lower abdominal quadrant. The right lower abdomen was prepped and draped in the usual sterile fashion. 1% lidocaine was used for local anesthesia. Following this, a 19 g Yueh catheter was introduced. An ultrasound image was saved for documentation purposes. The paracentesis was performed. The catheter was removed and a dressing was applied. The patient tolerated the procedure well without immediate post procedural complication. FINDINGS: A total of approximately 4.3 liters of cloudy milky fluid was removed. Samples were sent to the laboratory as requested by the clinical team. IMPRESSION: Successful ultrasound-guided paracentesis yielding 4.3 liters of peritoneal fluid. Read by Lavonia Drafts [facility] Electronically Signed   By: Lavonia Dana M.D.   On: 09/14/2019 09:07    ROS: As above Blood pressure (!) 167/86, pulse 88, temperature 98.6 F (37 C), temperature source Oral, resp. rate (!) 23, height 6' (1.829 m), weight 80.3 kg, SpO2 99 %. Estimated body mass index is 24.01 kg/m as calculated from the following:   Height as of this encounter: 6' (1.829 m).   Weight as of this encounter: 80.3 kg.  Physical Exam  General: A pleasant 74 year old white male who complains of headaches and neck pain.  HEENT: Normocephalic, atraumatic, pupils equal round react to light, he has a left cranial nerve 6 palsy.  Neck: No obvious deformities.  He has some meningismus  Thorax:  Symmetric  Abdomen:  Soft  Extremities: Unremarkable  Neurologic exam: The patient is alert and oriented x3, Glasgow Coma Scale 15.  Cranial nerves II through XII are examined bilaterally and grossly normal except he has a left cranial nerve VI palsy.  The patient's motor strength is 5/5 in his bilateral bicep, handgrip, quadriceps, gastrocnemius.  Cerebellar function is intact to rapid alternating movements of the upper extremities bilaterally.  Sensory function is intact to light touch sensation all tested dermatomes bilaterally.  Imaging studies: I have reviewed the patient's cervical CT performed at [facility] today.  There is some degenerative changes but no acute findings, fractures, etc.  I have also reviewed the patient's head CT performed at [facility] today.  It demonstrates the patient has a hemorrhage in the left anterior posterior fossa just adjacent to the medulla and pons.  I do not see any subarachnoid blood in the basal cisterns.  By report the patient CT angiogram performed at [facility] today was negative for aneurysm   Assessment/Plan: Posterior fossa hemorrhage, VI nerve palsy: The patient has been admitted by the hospitalist.  We will plan to repeat his CAT scan tomorrow to make sure there has been no additional bleeding.  I would recommend that he be worked up with a cerebral arteriogram in a few days to be double sure to rule out aneurysm.  I have spoken to Dr. Kathyrn Sheriff.  He will likely perform the arteriogram on Monday.  Multiple medical issues, paracentesis, end-stage renal disease, hemodialysis: Noted.  I appreciate the hospitalist management of these issues.  Ophelia Charter 09/14/2019, 6:05 PM

## 2019-09-14 NOTE — ED Notes (Signed)
Pt daughter,Jane, can be reached at [phone].

## 2019-09-14 NOTE — ED Notes (Signed)
Date and time results received: 09/14/19 1415   Test: Covid Critical Value: Positive  Name of Provider Notified: Dr. Manuella Ghazi  Orders Received? Or Actions Taken?: No new orders given.

## 2019-09-15 ENCOUNTER — Inpatient Hospital Stay (HOSPITAL_COMMUNITY): Payer: Medicare Other

## 2019-09-15 ENCOUNTER — Ambulatory Visit (HOSPITAL_COMMUNITY): Admission: RE | Admit: 2019-09-15 | Payer: Medicare Other | Source: Ambulatory Visit

## 2019-09-15 DIAGNOSIS — D471 Chronic myeloproliferative disease: Secondary | ICD-10-CM

## 2019-09-15 DIAGNOSIS — N186 End stage renal disease: Secondary | ICD-10-CM

## 2019-09-15 DIAGNOSIS — D696 Thrombocytopenia, unspecified: Secondary | ICD-10-CM

## 2019-09-15 DIAGNOSIS — I10 Essential (primary) hypertension: Secondary | ICD-10-CM

## 2019-09-15 DIAGNOSIS — U071 COVID-19: Secondary | ICD-10-CM

## 2019-09-15 DIAGNOSIS — I2510 Atherosclerotic heart disease of native coronary artery without angina pectoris: Secondary | ICD-10-CM

## 2019-09-15 DIAGNOSIS — Z992 Dependence on renal dialysis: Secondary | ICD-10-CM

## 2019-09-15 DIAGNOSIS — I615 Nontraumatic intracerebral hemorrhage, intraventricular: Secondary | ICD-10-CM

## 2019-09-15 DIAGNOSIS — I2583 Coronary atherosclerosis due to lipid rich plaque: Secondary | ICD-10-CM

## 2019-09-15 DIAGNOSIS — I609 Nontraumatic subarachnoid hemorrhage, unspecified: Secondary | ICD-10-CM

## 2019-09-15 LAB — GLUCOSE, CAPILLARY
Glucose-Capillary: 78 mg/dL (ref 70–99)
Glucose-Capillary: 86 mg/dL (ref 70–99)
Glucose-Capillary: 107 mg/dL — ABNORMAL HIGH (ref 70–99)
Glucose-Capillary: 113 mg/dL — ABNORMAL HIGH (ref 70–99)

## 2019-09-15 LAB — HEPATITIS B CORE ANTIBODY, TOTAL: Hep B Core Total Ab: NONREACTIVE

## 2019-09-15 LAB — PATHOLOGIST SMEAR REVIEW

## 2019-09-15 LAB — HEMOGLOBIN A1C
Hgb A1c MFr Bld: 4.7 % — ABNORMAL LOW (ref 4.8–5.6)
Mean Plasma Glucose: 88.19 mg/dL

## 2019-09-15 LAB — MRSA PCR SCREENING: MRSA by PCR: POSITIVE — AB

## 2019-09-15 MED ORDER — OXYCODONE HCL 5 MG PO TABS
5.0000 mg | ORAL_TABLET | Freq: Four times a day (QID) | ORAL | Status: DC | PRN
  Administered 2019-09-17 – 2019-09-20 (×3): 5 mg via ORAL
  Filled 2019-09-15 (×3): qty 1

## 2019-09-15 MED ORDER — PRAVASTATIN SODIUM 40 MG PO TABS
40.0000 mg | ORAL_TABLET | Freq: Every day | ORAL | Status: DC
  Administered 2019-09-15 – 2019-09-19 (×5): 40 mg via ORAL
  Filled 2019-09-15 (×5): qty 1

## 2019-09-15 MED ORDER — OXYCODONE-ACETAMINOPHEN 5-325 MG PO TABS
1.0000 | ORAL_TABLET | Freq: Four times a day (QID) | ORAL | Status: DC | PRN
  Administered 2019-09-17: 17:00:00 1 via ORAL
  Filled 2019-09-15: qty 1

## 2019-09-15 MED ORDER — CHLORHEXIDINE GLUCONATE CLOTH 2 % EX PADS
6.0000 | MEDICATED_PAD | Freq: Every day | CUTANEOUS | Status: DC
  Administered 2019-09-17: 6 via TOPICAL

## 2019-09-15 MED ORDER — TRAMADOL HCL 50 MG PO TABS
50.0000 mg | ORAL_TABLET | Freq: Four times a day (QID) | ORAL | Status: DC | PRN
  Administered 2019-09-16 – 2019-09-18 (×6): 50 mg via ORAL
  Filled 2019-09-15 (×6): qty 1

## 2019-09-15 NOTE — Progress Notes (Signed)
unable to continue HD  treatment because patient's access infiltrated twice just 5 min after he started treatment. MD notified and he ordered to send patient back to his room and that patient will have to come back for treatment tomorrow

## 2019-09-15 NOTE — Progress Notes (Signed)
Providing Compassionate, Quality Care - Together   Subjective: Patient reports he wants to go home. He is worried about getting his dialysis on time. He has complaints of dizziness and back pain. His daughter reports that she and her siblings have been worried about him. He doesn't tell them everything going on with him medically. They are concerned that he may have dementia. He has been having mood swings that are uncharacteristic for him.  Objective: Vital signs in last 24 hours: Temp:  [98.2 F (36.8 C)-98.6 F (37 C)] 98.5 F (36.9 C) (02/25 0414) Pulse Rate:  [73-99] 75 (02/25 0816) Resp:  [10-23] 12 (02/25 0816) BP: (138-167)/(71-91) 144/71 (02/25 0816) SpO2:  [90 %-100 %] 97 % (02/25 0816)  Intake/Output from previous day: 02/24 0701 - 02/25 0700 In: 3 [I.V.:3] Out: -  Intake/Output this shift: No intake/output data recorded.  Alert and oriented to self, place, and time 6th nerve palsy, Otherwise CN II-XII grossly intact Pupils, equal, round, reactive Speech clear, fluent MAE, Strength and sensation intact Balance and coordination impaired   Lab Results: Recent Labs    09/14/19 0928  WBC 8.2  HGB 9.0*  HCT 27.5*  PLT 67*   BMET Recent Labs    09/14/19 0928  NA 137  K 4.0  CL 102  CO2 25  GLUCOSE 139*  BUN 45*  CREATININE 4.19*  CALCIUM 8.4*    Studies/Results: CT Angio Head W/Cm &/Or Wo Cm  Result Date: 09/14/2019 CLINICAL DATA:  Subarachnoid hemorrhage [facility]) suspected. Additional history provided: Patient fell 2 weeks ago and again 2 days ago, does not remember if head trauma, denies loss of consciousness, double vision and headaches. EXAM: CT ANGIOGRAPHY HEAD TECHNIQUE: Multidetector CT imaging of the head was performed using the standard protocol during bolus administration of intravenous contrast. Multiplanar CT image reconstructions and MIPs were obtained to evaluate the vascular anatomy. CONTRAST:  142mL OMNIPAQUE IOHEXOL 350 MG/ML SOLN  COMPARISON:  Noncontrast head CT performed earlier the same day prior noncontrast head CT examinations 09/14/2019 and earlier FINDINGS: CT HEAD Brain: Again demonstrated is subarachnoid hemorrhage within the prepontine cistern, eccentric to the left. The hemorrhage has not significantly changed in size or extent from head CT performed earlier the same day, again measuring up to 15 mm in greatest thickness (remeasured on prior). Unchanged mild mass effect upon the pons. Unchanged small volume intraventricular hemorrhage within the dependent lateral ventricles. The ventricles are unchanged in size and configuration without evidence of hydrocephalus. Redemonstrated focus of encephalomalacia within the anterolateral right temporal lobe, which may be posttraumatic or post ischemic in etiology. 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 Ill-defined hypodensity within the cerebral white matter is nonspecific, but consistent with chronic small vessel ischemic disease. Stable, mild generalized parenchymal atrophy. Vascular: Reported separately. Skull: Normal. Negative for fracture or focal lesion. Sinuses: No significant paranasal sinus disease or mastoid effusion at the imaged levels. Orbits: Visualized orbits demonstrate no acute abnormality. CTA HEAD Anterior circulation: The intracranial internal carotid arteries are patent with prominent atherosclerotic calcification. Sites of mild to moderate stenosis within the right ICA. Sites of narrowing within the left ICA, greatest within the paraclinoid segment (at least moderate in severity at this site). The M1 middle cerebral arteries are patent without significant stenosis. No M2 proximal branch occlusion or high-grade proximal stenosis is identified. The anterior cerebral arteries are patent without high-grade proximal stenosis. No anterior circulation aneurysm is identified Posterior circulation: The non dominant intracranial right vertebral  artery is developmentally diminutive beyond the origin of the right PICA, although patent. The dominant intracranial left vertebral artery is patent. Calcified plaque in the V4 left vertebral artery is incompletely imaged but contributes to moderate stenosis at the visualized levels. The basilar artery is patent without significant stenosis. The posterior cerebral arteries are patent bilaterally. Mild narrowing within the proximal P2 right PCA. There is a sizable left posterior communicating artery with hypoplastic left P1 segment. A right posterior communicating artery is not definitive identified. No posterior circulation aneurysm is identified. Venous sinuses: Within limitations of contrast timing, no convincing thrombus. Anatomic variants: As described IMPRESSION: CT head: 1. Unchanged prepontine subarachnoid hemorrhage and small volume hemorrhage layering within the lateral ventricles. No evidence of interval hemorrhage. No evidence of hydrocephalus on the current exam. 2. Additional chronic findings without interval change as described. CTA head: 1. No intracranial aneurysm is identified at the imaged levels. 2. Intracranial atherosclerotic disease with multifocal stenoses, most notably as follows. 3. Prominent calcified plaque within the intracranial internal carotid arteries bilaterally. Sites of mild/moderate stenosis within the right ICA. Narrowing of the intracranial left ICA is greatest within the left paraclinoid segment, at least moderate at this site. 4. Calcified plaque results in moderate stenosis of the dominant V4 left vertebral artery. Electronically Signed   By: Kellie Simmering DO   On: 09/14/2019 12:38   CT HEAD WO CONTRAST  Result Date: 09/15/2019 CLINICAL DATA:  Subarachnoid hemorrhage. EXAM: CT HEAD WITHOUT CONTRAST TECHNIQUE: Contiguous axial images were obtained from the base of the skull through the vertex without intravenous contrast. COMPARISON:  CT angiogram head 09/14/2019, head CT  09/14/2019 FINDINGS: Brain: Subarachnoid hemorrhage within the prepontine cistern, eccentric to the left, has not significantly changed in extent as compared to prior examination 09/14/2019. This again measures up to 15 mm in greatest thickness. Similar appearance of mild mass effect upon the left anterolateral pons. Again demonstrated is small volume hemorrhage layering dependently within the lateral ventricles. Hemorrhage within the right lateral ventricle as minimally increased, likely due to redistribution. The ventricles are unchanged in size and configuration without evidence of hydrocephalus. No acute demarcated cortical infarct. Redemonstrated chronic encephalomalacia within the anterolateral right temporal lobe, which may be posttraumatic or post-ischemic in etiology. Redemonstrated chronic infarcts within the bilateral basal ganglia and left cerebellum. Ill-defined hypoattenuation within the cerebral white matter is nonspecific, but consistent with chronic small vessel ischemic disease. Stable, mild generalized parenchymal atrophy. Vascular: No hyperdense vessel.  Atherosclerotic calcifications. Skull: Normal. Negative for fracture or focal lesion. Sinuses/Orbits: Visualized orbits demonstrate no acute abnormality. No significant paranasal sinus disease or mastoid effusion at the imaged levels. IMPRESSION: 1. Prepontine subarachnoid hemorrhage has not significantly changed since prior exam 09/14/2019. Mild mass effect upon the pons. Redemonstrated small volume hemorrhage layering within the lateral ventricles. Hemorrhage within the right ventricle has minimally increased, likely due to redistribution. No evidence of hydrocephalus. 2. Additional chronic findings without interval change as described. Electronically Signed   By: Kellie Simmering DO   On: 09/15/2019 11:06   CT HEAD WO CONTRAST  Result Date: 09/14/2019 CLINICAL DATA:  Fall at dialysis with headache EXAM: CT HEAD WITHOUT CONTRAST CT CERVICAL  SPINE WITHOUT CONTRAST TECHNIQUE: Multidetector CT imaging of the head and cervical spine was performed following the standard protocol without intravenous contrast. Multiplanar CT image reconstructions of the cervical spine were also generated. COMPARISON:  None. FINDINGS: CT HEAD FINDINGS Brain: Left para median prepontine subarachnoid hemorrhage measuring 11 mm thickness. There is a  reported left 6 nerve palsy which correlates with this location. Intraventricular hemorrhage is seen layering in the lateral ventricles, likely intraventricular reflux. There is history of syncope and fall. Encephalomalacia in the inferior right temporal lobe which could be posttraumatic or ischemic. There have been remote small vessel infarcts at the basal ganglia. No hydrocephalus when accounting for atrophy. Vascular: No hyperdense vessel or unexpected calcification. Skull: Normal. Negative for fracture or focal lesion. Sinuses/Orbits: Negative Critical Value/emergent results were called by telephone at the time of interpretation on 09/14/2019 at 10:26 am to provider [facility] , who verbally acknowledged these results. CT CERVICAL SPINE FINDINGS Alignment: No traumatic malalignment Skull base and vertebrae: Negative for acute fracture Soft tissues and spinal canal: No prevertebral fluid or swelling. No visible canal hematoma. Disc levels: Lower cervical disc and upper cervical facet degeneration. Upper chest: Negative IMPRESSION: 1. Subarachnoid hemorrhage in the prepontine cistern that is left eccentric and fairly focal/thick. Reportedly there was preceding syncope and fall. 2. Small volume hemorrhage layering in the lateral ventricles. 3. Negative for cervical spine fracture. 4. Remote small vessel infarcts. Electronically Signed   By: Monte Fantasia M.D.   On: 09/14/2019 10:29   CT CERVICAL SPINE WO CONTRAST  Result Date: 09/14/2019 CLINICAL DATA:  Fall at dialysis with headache EXAM: CT HEAD WITHOUT CONTRAST CT CERVICAL  SPINE WITHOUT CONTRAST TECHNIQUE: Multidetector CT imaging of the head and cervical spine was performed following the standard protocol without intravenous contrast. Multiplanar CT image reconstructions of the cervical spine were also generated. COMPARISON:  None. FINDINGS: CT HEAD FINDINGS Brain: Left para median prepontine subarachnoid hemorrhage measuring 11 mm thickness. There is a reported left 6 nerve palsy which correlates with this location. Intraventricular hemorrhage is seen layering in the lateral ventricles, likely intraventricular reflux. There is history of syncope and fall. Encephalomalacia in the inferior right temporal lobe which could be posttraumatic or ischemic. There have been remote small vessel infarcts at the basal ganglia. No hydrocephalus when accounting for atrophy. Vascular: No hyperdense vessel or unexpected calcification. Skull: Normal. Negative for fracture or focal lesion. Sinuses/Orbits: Negative Critical Value/emergent results were called by telephone at the time of interpretation on 09/14/2019 at 10:26 am to provider [facility] , who verbally acknowledged these results. CT CERVICAL SPINE FINDINGS Alignment: No traumatic malalignment Skull base and vertebrae: Negative for acute fracture Soft tissues and spinal canal: No prevertebral fluid or swelling. No visible canal hematoma. Disc levels: Lower cervical disc and upper cervical facet degeneration. Upper chest: Negative IMPRESSION: 1. Subarachnoid hemorrhage in the prepontine cistern that is left eccentric and fairly focal/thick. Reportedly there was preceding syncope and fall. 2. Small volume hemorrhage layering in the lateral ventricles. 3. Negative for cervical spine fracture. 4. Remote small vessel infarcts. Electronically Signed   By: Monte Fantasia M.D.   On: 09/14/2019 10:29   US Paracentesis  Result Date: 09/14/2019 INDICATION: Cirrhosis Recurrent ascites EXAM: ULTRASOUND GUIDED  PARACENTESIS MEDICATIONS: 10 cc 1%  lidocaine COMPLICATIONS: None immediate. PROCEDURE: Informed written consent was obtained from the patient after a discussion of the risks, benefits and alternatives to treatment. A timeout was performed prior to the initiation of the procedure. Initial ultrasound scanning demonstrates a large amount of ascites within the right lower abdominal quadrant. The right lower abdomen was prepped and draped in the usual sterile fashion. 1% lidocaine was used for local anesthesia. Following this, a 19 g Yueh catheter was introduced. An ultrasound image was saved for documentation purposes. The paracentesis was performed. The catheter  was removed and a dressing was applied. The patient tolerated the procedure well without immediate post procedural complication. FINDINGS: A total of approximately 4.3 liters of cloudy milky fluid was removed. Samples were sent to the laboratory as requested by the clinical team. IMPRESSION: Successful ultrasound-guided paracentesis yielding 4.3 liters of peritoneal fluid. Read by Lavonia Drafts [facility] Electronically Signed   By: Lavonia Dana M.D.   On: 09/14/2019 09:07    Assessment/Plan: Patient with subarachnoid hemorrhage in the prepontine cistern with layering in the lateral ventricles on 09/14/2019. No evidence of aneurysm on CT angio. SAH hemorrhage stable on follow up CT scan.   LOS: 1 day    -CT arteriogram planned for Monday with Dr. Kathyrn Sheriff. Procedure explained to patient and his daughter.   Viona Gilmore, DNP, AGNP-C Nurse Practitioner  [facility] [place] [address], [address], [place], [place] [address] P: [phone]    F: [phone]  09/15/2019, 11:02 AM

## 2019-09-15 NOTE — Progress Notes (Signed)
[name]    Progress Webb  [name]  RWE:[id number] DOB: 23-Feb-1946 DOA: 09/14/2019 PCP: [name], MD     Brief Narrative:   [name] is an 74 y.o. 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  Apparently had a brief syncopal episode after his dialysis on 09/12/2019.  ct of the head done on admission showed subarachnoid hemorrhage in the prepontine cistern.  The emergency room physician consulted neurosurgery who recommended transfer from [facility] to [facility].  Assessment/Plan:   Subarachnoid hemorrhage with small hemorrhage in the lateral ventricles with 6th nerve palsy: CT of the head showed a subarachnoid hemorrhage neurosurgery was consulted and they recommended to repeat a CT of the head on 09/15/2019 which is pending at the time of this dictation and consult IR for possible angiogram to rule out an aneurysm which would be done on 09/19/2019. Continue to hold all anticoagulation and antiplatelet therapy patient has new thrombocytopenia. There is no history of trauma or at least the patient does not remember any trauma to the head no syncope or loss of consciousness.  Liver cirrhosis with ascites status post paracentesis on 09/14/2019: On admission he had 4.3 L removed.  Continue fluid restrictions and diuretic therapy.  Thrombocytopenia: In the setting of cirrhosis and myelodysplastic syndrome.  Going back through his imaging results or spinal megaly which can contribute to sequestration. Continue to follow CBCs regularly.  End-stage renal disease on hemodialysis Monday Wednesdays and Fridays: Last hemodialysis on  09/12/2019, there is no need for emergent dialysis potassium and bicarbonate are stable, we have consulted nephrology.  CAD with prior stent: Continue pravastatin and labetalol.  Myelodysplastic syndrome: Follow-up with his oncologist as an outpatient continue to follow CBC closely his platelet count is down.  Diabetes mellitus type 2, controlled: Continue carb modified diet and sliding scale blood glucose is fairly controlled.  Recent COVID-19 pneumonia: He is currently asymptomatic no further isolation needed.  GERD: Continue PPI.  Essential hypertension: Continue labetalol pressure seems to be fairly controlled we will keep a close eye on blood pressure and try to keep it below 180/90 due to his subarachnoid hemorrhage.    DVT prophylaxis: scd's Family Communication:none Disposition Plan/Barrier to D/C: Unable to determine per neurosurgery  Code Status:     Code Status Orders  (From admission, onward)         Start     Ordered   09/14/19 1243  Full code  Continuous     09/14/19 1243        Code Status History    Date Active Date Inactive Code Status Order ID Comments User Context   07/23/2019 2222 07/26/2019 2156 Full Code [id number]  Truett Mainland, DO Inpatient   04/19/2019 2332 04/21/2019 2005 Full Code [id number]  Vianne Bulls, MD ED   09/15/2016 1159 09/16/2016 1706 Full Code [id number]  Eber Jones, MD Inpatient   08/12/2016 0927 08/14/2016 1649 Full Code [id number]  Jettie Booze, MD Inpatient   04/25/2016 1618 05/04/2016 1716 Full Code [id number]  Oswald Hillock, MD Inpatient   04/15/2016 1549 04/16/2016 2018 Full Code [id number]  Kathie Dike, MD Inpatient   Advance Care Planning Activity  IV Access:    Peripheral IV   Procedures and diagnostic studies:   CT Angio Head W/Cm &/Or Wo Cm  Result Date: 09/14/2019 CLINICAL DATA:  Subarachnoid hemorrhage [facility]) suspected. Additional history provided: Patient fell 2 weeks ago and again 2  days ago, does not remember if head trauma, denies loss of consciousness, double vision and headaches. EXAM: CT ANGIOGRAPHY HEAD TECHNIQUE: Multidetector CT imaging of the head was performed using the standard protocol during bolus administration of intravenous contrast. Multiplanar CT image reconstructions and MIPs were obtained to evaluate the vascular anatomy. CONTRAST:  169mL OMNIPAQUE IOHEXOL 350 MG/ML SOLN COMPARISON:  Noncontrast head CT performed earlier the same day prior noncontrast head CT examinations 09/14/2019 and earlier FINDINGS: CT HEAD Brain: Again demonstrated is subarachnoid hemorrhage within the prepontine cistern, eccentric to the left. The hemorrhage has not significantly changed in size or extent from head CT performed earlier the same day, again measuring up to 15 mm in greatest thickness (remeasured on prior). Unchanged mild mass effect upon the pons. Unchanged small volume intraventricular hemorrhage within the dependent lateral ventricles. The ventricles are unchanged in size and configuration without evidence of hydrocephalus. Redemonstrated focus of encephalomalacia within the anterolateral right temporal lobe, which may be posttraumatic or post ischemic in etiology. 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 Ill-defined hypodensity within the cerebral white matter is nonspecific, but consistent with chronic small vessel ischemic disease. Stable, mild generalized parenchymal atrophy. Vascular: Reported separately. Skull: Normal. Negative for fracture or focal lesion. Sinuses: No significant paranasal sinus disease or mastoid effusion at the imaged levels. Orbits: Visualized orbits demonstrate no acute abnormality. CTA HEAD Anterior circulation: The intracranial internal carotid arteries are patent with prominent atherosclerotic calcification. Sites of mild to moderate stenosis within the right ICA. Sites of narrowing within the left ICA, greatest within the  paraclinoid segment (at least moderate in severity at this site). The M1 middle cerebral arteries are patent without significant stenosis. No M2 proximal branch occlusion or high-grade proximal stenosis is identified. The anterior cerebral arteries are patent without high-grade proximal stenosis. No anterior circulation aneurysm is identified Posterior circulation: The non dominant intracranial right vertebral artery is developmentally diminutive beyond the origin of the right PICA, although patent. The dominant intracranial left vertebral artery is patent. Calcified plaque in the V4 left vertebral artery is incompletely imaged but contributes to moderate stenosis at the visualized levels. The basilar artery is patent without significant stenosis. The posterior cerebral arteries are patent bilaterally. Mild narrowing within the proximal P2 right PCA. There is a sizable left posterior communicating artery with hypoplastic left P1 segment. A right posterior communicating artery is not definitive identified. No posterior circulation aneurysm is identified. Venous sinuses: Within limitations of contrast timing, no convincing thrombus. Anatomic variants: As described IMPRESSION: CT head: 1. Unchanged prepontine subarachnoid hemorrhage and small volume hemorrhage layering within the lateral ventricles. No evidence of interval hemorrhage. No evidence of hydrocephalus on the current exam. 2. Additional chronic findings without interval change as described. CTA head: 1. No intracranial aneurysm is identified at the imaged levels. 2. Intracranial atherosclerotic disease with multifocal stenoses, most notably as follows. 3. Prominent calcified plaque within the intracranial internal carotid arteries bilaterally. Sites of mild/moderate stenosis within the right ICA. Narrowing of the intracranial left ICA is greatest within the left paraclinoid segment, at least moderate at this site. 4. Calcified plaque results in moderate  stenosis of the dominant V4 left vertebral artery. Electronically Signed   By: Marylyn Ishihara  Golden DO   On: 09/14/2019 12:38   CT HEAD WO CONTRAST  Result Date: 09/14/2019 CLINICAL DATA:  Fall at dialysis with headache EXAM: CT HEAD WITHOUT CONTRAST CT CERVICAL SPINE WITHOUT CONTRAST TECHNIQUE: Multidetector CT imaging of the head and cervical spine was performed following the standard protocol without intravenous contrast. Multiplanar CT image reconstructions of the cervical spine were also generated. COMPARISON:  None. FINDINGS: CT HEAD FINDINGS Brain: Left para median prepontine subarachnoid hemorrhage measuring 11 mm thickness. There is a reported left 6 nerve palsy which correlates with this location. Intraventricular hemorrhage is seen layering in the lateral ventricles, likely intraventricular reflux. There is history of syncope and fall. Encephalomalacia in the inferior right temporal lobe which could be posttraumatic or ischemic. There have been remote small vessel infarcts at the basal ganglia. No hydrocephalus when accounting for atrophy. Vascular: No hyperdense vessel or unexpected calcification. Skull: Normal. Negative for fracture or focal lesion. Sinuses/Orbits: Negative Critical Value/emergent results were called by telephone at the time of interpretation on 09/14/2019 at 10:26 am to provider [facility] , who verbally acknowledged these results. CT CERVICAL SPINE FINDINGS Alignment: No traumatic malalignment Skull base and vertebrae: Negative for acute fracture Soft tissues and spinal canal: No prevertebral fluid or swelling. No visible canal hematoma. Disc levels: Lower cervical disc and upper cervical facet degeneration. Upper chest: Negative IMPRESSION: 1. Subarachnoid hemorrhage in the prepontine cistern that is left eccentric and fairly focal/thick. Reportedly there was preceding syncope and fall. 2. Small volume hemorrhage layering in the lateral ventricles. 3. Negative for cervical spine  fracture. 4. Remote small vessel infarcts. Electronically Signed   By: Monte Fantasia M.D.   On: 09/14/2019 10:29   CT CERVICAL SPINE WO CONTRAST  Result Date: 09/14/2019 CLINICAL DATA:  Fall at dialysis with headache EXAM: CT HEAD WITHOUT CONTRAST CT CERVICAL SPINE WITHOUT CONTRAST TECHNIQUE: Multidetector CT imaging of the head and cervical spine was performed following the standard protocol without intravenous contrast. Multiplanar CT image reconstructions of the cervical spine were also generated. COMPARISON:  None. FINDINGS: CT HEAD FINDINGS Brain: Left para median prepontine subarachnoid hemorrhage measuring 11 mm thickness. There is a reported left 6 nerve palsy which correlates with this location. Intraventricular hemorrhage is seen layering in the lateral ventricles, likely intraventricular reflux. There is history of syncope and fall. Encephalomalacia in the inferior right temporal lobe which could be posttraumatic or ischemic. There have been remote small vessel infarcts at the basal ganglia. No hydrocephalus when accounting for atrophy. Vascular: No hyperdense vessel or unexpected calcification. Skull: Normal. Negative for fracture or focal lesion. Sinuses/Orbits: Negative Critical Value/emergent results were called by telephone at the time of interpretation on 09/14/2019 at 10:26 am to provider [facility] , who verbally acknowledged these results. CT CERVICAL SPINE FINDINGS Alignment: No traumatic malalignment Skull base and vertebrae: Negative for acute fracture Soft tissues and spinal canal: No prevertebral fluid or swelling. No visible canal hematoma. Disc levels: Lower cervical disc and upper cervical facet degeneration. Upper chest: Negative IMPRESSION: 1. Subarachnoid hemorrhage in the prepontine cistern that is left eccentric and fairly focal/thick. Reportedly there was preceding syncope and fall. 2. Small volume hemorrhage layering in the lateral ventricles. 3. Negative for cervical spine  fracture. 4. Remote small vessel infarcts. Electronically Signed   By: Monte Fantasia M.D.   On: 09/14/2019 10:29   US Paracentesis  Result Date: 09/14/2019 INDICATION: Cirrhosis Recurrent ascites EXAM: ULTRASOUND GUIDED  PARACENTESIS MEDICATIONS: 10 cc 1% lidocaine COMPLICATIONS: None immediate. PROCEDURE: Informed written  consent was obtained from the patient after a discussion of the risks, benefits and alternatives to treatment. A timeout was performed prior to the initiation of the procedure. Initial ultrasound scanning demonstrates a large amount of ascites within the right lower abdominal quadrant. The right lower abdomen was prepped and draped in the usual sterile fashion. 1% lidocaine was used for local anesthesia. Following this, a 19 g Yueh catheter was introduced. An ultrasound image was saved for documentation purposes. The paracentesis was performed. The catheter was removed and a dressing was applied. The patient tolerated the procedure well without immediate post procedural complication. FINDINGS: A total of approximately 4.3 liters of cloudy milky fluid was removed. Samples were sent to the laboratory as requested by the clinical team. IMPRESSION: Successful ultrasound-guided paracentesis yielding 4.3 liters of peritoneal fluid. Read by Lavonia Drafts [facility] Electronically Signed   By: Lavonia Dana M.D.   On: 09/14/2019 09:07     Medical Consultants:    None.  Anti-Infectives:   None  Subjective:    NOLLIE TERLIZZI not in a good mood this morning grumpy.  Objective:    Vitals:   09/14/19 2012 09/14/19 2345 09/15/19 0414 09/15/19 0816  BP: (!) 161/82 138/76 (!) 142/72 (!) 144/71  Pulse: 84 77 73 75  Resp: 11 15 15 12   Temp: 98.3 F (36.8 C) 98.2 F (36.8 C) 98.5 F (36.9 C)   TempSrc: Oral Axillary Oral   SpO2: 97% 97% 96% 97%  Weight:      Height:       SpO2: 97 %   Intake/Output Summary (Last 24 hours) at 09/15/2019 0955 Last data filed at 09/14/2019  1304 Gross per 24 hour  Intake 3 ml  Output --  Net 3 ml   Filed Weights   09/14/19 0911  Weight: 80.3 kg    Exam: General exam: In no acute distress. Respiratory system: Good air movement and clear to auscultation. Cardiovascular system: S1 & S2 heard, RRR.  Gastrointestinal system: Abdomen is nondistended, soft and nontender.  Central nervous system: Alert and oriented.  6th Nerve palsy Extremities: No pedal edema. Skin: No rashes, lesions or ulcers  Data Reviewed:    Labs: Basic Metabolic Panel: Recent Labs  Lab 09/14/19 0928  NA 137  K 4.0  CL 102  CO2 25  GLUCOSE 139*  BUN 45*  CREATININE 4.19*  CALCIUM 8.4*   GFR Estimated Creatinine Clearance: 17 mL/min (A) (by C-G formula based on SCr of 4.19 mg/dL (H)). Liver Function Tests: Recent Labs  Lab 09/14/19 0928  AST 37  ALT 34  ALKPHOS 173*  BILITOT 0.5  PROT 5.4*  ALBUMIN 2.7*   No results for input(s): LIPASE, AMYLASE in the last 168 hours. No results for input(s): AMMONIA in the last 168 hours. Coagulation profile Recent Labs  Lab 09/14/19 0928  INR 1.0   COVID-19 Labs  No results for input(s): DDIMER, FERRITIN, LDH, CRP in the last 72 hours.  Lab Results  Component Value Date   SARSCOV2NAA POSITIVE (A) 09/14/2019   [place] NEGATIVE 04/19/2019   [place] NEGATIVE 04/12/2019    CBC: Recent Labs  Lab 09/14/19 0928  WBC 8.2  HGB 9.0*  HCT 27.5*  MCV 96.5  PLT 67*   Cardiac Enzymes: No results for input(s): CKTOTAL, CKMB, CKMBINDEX, TROPONINI in the last 168 hours. BNP (last 3 results) No results for input(s): PROBNP in the last 8760 hours. CBG: Recent Labs  Lab 09/14/19 0906 09/14/19 1744 09/14/19 2104 09/15/19  0624  GLUCAP 125* 81 99 78   D-Dimer: No results for input(s): DDIMER in the last 72 hours. Hgb A1c: Recent Labs    09/14/19 0928  HGBA1C 4.7*   Lipid Profile: No results for input(s): CHOL, HDL, LDLCALC, TRIG, CHOLHDL, LDLDIRECT in the last 72  hours. Thyroid function studies: No results for input(s): TSH, T4TOTAL, T3FREE, THYROIDAB in the last 72 hours.  Invalid input(s): FREET3 Anemia work up: No results for input(s): VITAMINB12, FOLATE, FERRITIN, TIBC, IRON, RETICCTPCT in the last 72 hours. Sepsis Labs: Recent Labs  Lab 09/14/19 0928  WBC 8.2   Microbiology Recent Results (from the past 240 hour(s))  Culture, body fluid-bottle     Status: None   Collection Time: 09/05/19  2:43 PM   Specimen: Ascitic  Result Value Ref Range Status   Specimen Description ASCITIC  Final   Special Requests BOTTLES DRAWN AEROBIC AND ANAEROBIC 10CC  Final   Culture   Final    NO GROWTH 5 DAYS Performed at [facility], [address], [place], [place] [address]    Report Status 09/13/2019 FINAL  Final  Gram stain     Status: None   Collection Time: 09/05/19  2:43 PM   Specimen: Ascitic  Result Value Ref Range Status   Specimen Description ASCITIC  Final   Special Requests NONE  Final   Gram Stain   Final    NO ORGANISMS SEEN NO WBC SEEN CYTOSPIN SMEAR Performed at [facility], [address] [place], [place] [address]    Report Status 09/05/2019 FINAL  Final  Gram stain     Status: None   Collection Time: 09/09/19  9:08 AM   Specimen: Peritoneal Washings  Result Value Ref Range Status   Specimen Description PERITONEAL  Final   Special Requests NONE  Final   Gram Stain   Final    NO ORGANISMS SEEN WBC PRESENT, PREDOMINANTLY MONONUCLEAR CYTOSPIN SMEAR Performed at [facility], [address], [place], [place] [address]    Report Status 09/09/2019 FINAL  Final  Culture, body fluid-bottle     Status: None   Collection Time: 09/09/19  9:08 AM   Specimen: Peritoneal Washings  Result Value Ref Range Status   Specimen Description   Final    PERITONEAL Performed at [facility], [address] [place], [place] [address]    Special Requests   Final    BOTTLES DRAWN AEROBIC AND ANAEROBIC 10 CC Performed at [facility],  [address], [place], [place] [address]    Culture   Final    NO GROWTH 5 DAYS Performed at [facility], [place] [address] [place], [place] [address]    Report Status 09/14/2019 FINAL  Final  Culture, body fluid-bottle     Status: None (Preliminary result)   Collection Time: 09/14/19  8:24 AM   Specimen: Ascitic  Result Value Ref Range Status   Specimen Description ASCITIC  Final   Special Requests BOTTLES DRAWN AEROBIC AND ANAEROBIC 10CC  Final   Culture   Final    NO GROWTH < 24 HOURS Performed at [facility], [address] [place], [place] [address]    Report Status PENDING  Incomplete  Gram stain     Status: None   Collection Time: 09/14/19  8:24 AM   Specimen: Ascitic  Result Value Ref Range Status   Specimen Description ASCITIC  Final   Special Requests NONE  Final   Gram Stain   Final  NO ORGANISMS SEEN WBC PRESENT, PREDOMINANTLY MONONUCLEAR CYTOSPIN SMEAR Performed at [facility], [address], [place], [place] [address]    Report Status 09/14/2019 FINAL  Final  Respiratory Panel by RT PCR (Flu A&B, Covid) - Nasopharyngeal Swab     Status: Abnormal   Collection Time: 09/14/19 11:35 AM   Specimen: Nasopharyngeal Swab  Result Value Ref Range Status   SARS Coronavirus 2 by RT PCR POSITIVE (A) NEGATIVE Final    Comment: RESULT CALLED TO, READ BACK BY AND VERIFIED WITH: KENDRICK J. AT 1415 ON [id number] BY THOMPSON S. (Webb) SARS-CoV-2 target nucleic acids are DETECTED. SARS-CoV-2 RNA is generally detectable in upper respiratory specimens  during the acute phase of infection. Positive results are indicative of the presence of the identified virus, but do not rule out bacterial infection or co-infection with other pathogens not detected by the test. Clinical correlation with patient history and other diagnostic information is necessary to determine patient infection status. The expected result is Negative. Fact Sheet for Patients:   PinkCheek.be Fact Sheet for Healthcare Providers: GravelBags.it This test is not yet approved or cleared by the [place] FDA and  has been authorized for detection and/or diagnosis of SARS-CoV-2 by FDA under an Emergency Use Authorization (EUA).  This EUA will remain in effect (meaning this test can b e used) for the duration of  the COVID-19 declaration under Section 564(b)(1) of the Act, 21 U.S.C. section 360bbb-3(b)(1), unless the authorization is terminated or revoked sooner.    Influenza A by PCR NEGATIVE NEGATIVE Final   Influenza B by PCR NEGATIVE NEGATIVE Final    Comment: (Webb) The Xpert Xpress SARS-CoV-2/FLU/RSV assay is intended as an aid in  the diagnosis of influenza from Nasopharyngeal swab specimens and  should not be used as a sole basis for treatment. Nasal washings and  aspirates are unacceptable for Xpert Xpress SARS-CoV-2/FLU/RSV  testing. Fact Sheet for Patients: PinkCheek.be Fact Sheet for Healthcare Providers: GravelBags.it This test is not yet approved or cleared by the [place] FDA and  has been authorized for detection and/or diagnosis of SARS-CoV-2 by  FDA under an Emergency Use Authorization (EUA). This EUA will remain  in effect (meaning this test can be used) for the duration of the  Covid-19 declaration under Section 564(b)(1) of the Act, 21  U.S.C. section 360bbb-3(b)(1), unless the authorization is  terminated or revoked. Performed at [facility], [address], [place], [place] [address]      Medications:   . DULoxetine  60 mg Oral Daily  . insulin aspart  0-5 Units Subcutaneous QHS  . insulin aspart  0-6 Units Subcutaneous TID WC  . labetalol  100 mg Oral BID  . oxyCODONE-acetaminophen  1 tablet Oral Q6H WA   And  . oxyCODONE  5 mg Oral Q6H WA  . pravastatin  40 mg Oral q1800  . ruxolitinib phosphate  5 mg Oral  Daily  . sevelamer carbonate  800 mg Oral TID WC  . sodium chloride flush  3 mL Intravenous Q12H  . traMADol  50 mg Oral Q6H   Continuous Infusions: . sodium chloride        LOS: 1 day   Charlynne Cousins  [facility]  09/15/2019, 9:55 AM

## 2019-09-15 NOTE — Consult Note (Signed)
Reason for Consult: Continuity of ESRD care Referring Physician: Charlynne Cousins MD  HPI:  74 year old Caucasian man with past medical history significant for congestive heart failure, coronary artery disease status post PCI, hypertension, myelodysplastic syndrome, type 2 diabetes mellitus and end-stage renal disease on hemodialysis on a Monday/Wednesday/Friday schedule at [place], [place].  Brought into the emergency room at [facility] with worsening headache/double vision and blurred vision while undergoing paracentesis for ascites related to his cirrhosis.  There is reports of preceding headache and worsening confusion/unsteadiness prior to the procedure as well as a syncopal event after hemodialysis on Monday 2/22.  A CT scan of the head showed subarachnoid hemorrhage with focal collection of hemorrhage in the lateral ventricles and no midline shift.  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  He missed his dialysis yesterday.  He denies any chest pain and reports some abdominal discomfort from distention.  He denies any shortness of breath and is oxygenating well on room air.  He denies any nausea, vomiting or diarrhea.  He wants to go home.  Daughter at his side feels that he has had recent mood changes and problems with memory.  Past Medical History:  Diagnosis Date  . Anxiety   . Arthritis   . BPH (benign prostatic hyperplasia)   . CAD (coronary artery disease)    DES to circumflex 07/2016, moderate residual LAD and RCA, small 90% OM3 - managed medically  . CHF (congestive heart failure) ([place])   . Chronic lower back pain   . Depression   . ESRD (end stage renal disease) ([place])    Hemo MWF [facility]  . Essential hypertension   . Gastritis   . GERD (gastroesophageal reflux disease)   . History of kidney stones   . Leukemia ([place])   . Leukocytosis   . Myelodysplastic  disease ([place])   . 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[place])    Likely MDS/MPN, unclassifiable - folowed at [facility]  . Pleural effusion    had Thorancentesis [facility]  . Pneumonia    last time 08/2018- Aspiration Pneumonia - unintentional opiate overdose  . Renal insufficiency   . Spleen enlarged   . Thrombocythemia ([place])   . Type II diabetes mellitus ([place])     Past Surgical History:  Procedure Laterality Date  . AV FISTULA PLACEMENT Left 04/14/2019   Procedure: ARTERIOVENOUS (AV) FISTULA CREATION LEFT ARM;  Surgeon: Rosetta Posner, MD;  Location: [place];  Service: Vascular;  Laterality: Left;  . BIOPSY  12/08/2017   Procedure: BIOPSY;  Surgeon: Danie Binder, MD;  [facility];  Service: Endoscopy;;  gastric  . BONE MARROW BIOPSY     x 3 times  . CARDIAC CATHETERIZATION N/A 08/12/2016   Procedure: Left Heart Cath and Coronary Angiography;  Surgeon: Jettie Booze, MD;  Location: [place] CV LAB;  Service: Cardiovascular;  Laterality: N/A;  . CARDIAC CATHETERIZATION N/A 08/12/2016   Procedure: Coronary Stent Intervention;  Surgeon: Jettie Booze, MD;  Location: [place] CV LAB;  Service: Cardiovascular;  Laterality: N/A;  . CARDIAC CATHETERIZATION  2000s X 1  . COLONOSCOPY WITH PROPOFOL N/A 02/01/2013   SLF: 1. 23 colon polyps removed. 2 retrieved. 2. Mild diverticulosis in teh sigmoid colon 3. Small internal hemorrhoids 4. The colon is redundant   . COLONOSCOPY WITH PROPOFOL N/A 12/08/2017   Procedure: COLONOSCOPY WITH PROPOFOL;  Surgeon: Danie Binder, MD;  [facility];  Service: Endoscopy;  Laterality: N/A;  1:45pm  . CYSTOSCOPY W/ STONE MANIPULATION    . ESOPHAGOGASTRODUODENOSCOPY (EGD) WITH PROPOFOL N/A 02/01/2013   SLF: 1. Moderate erosive gastritis  . ESOPHAGOGASTRODUODENOSCOPY (EGD) WITH PROPOFOL N/A 12/08/2017   Procedure: ESOPHAGOGASTRODUODENOSCOPY (EGD) WITH PROPOFOL;  Surgeon: Danie Binder, MD;  [facility];  Service: Endoscopy;   Laterality: N/A;  . IR REMOVAL TUN CV CATH W/O FL  09/06/2019  . [place]  . POLYPECTOMY N/A 02/01/2013   Procedure: POLYPECTOMY;  Surgeon: Danie Binder, MD;  Location: [facility] OR[facility];  Service: Endoscopy;  Laterality: N/A;    Family History  Problem Relation Age of Onset  . Heart attack Mother   . CVA Mother   . Heart attack Father   . Colon cancer Neg Hx     Social History:  reports that he quit smoking about 6 years ago. His smoking use included cigars. He started smoking about 44 years ago. He has a 20.00 pack-year smoking history. He has never used smokeless tobacco. He reports previous alcohol use. He reports that he does not use drugs.  Allergies:  Allergies  Allergen Reactions  . Hydrocodone Itching    Medications:  Scheduled: . [START ON 09/16/2019] Chlorhexidine Gluconate Cloth  6 each Topical Q0600  . DULoxetine  60 mg Oral Daily  . insulin aspart  0-5 Units Subcutaneous QHS  . insulin aspart  0-6 Units Subcutaneous TID WC  . labetalol  100 mg Oral BID  . oxyCODONE-acetaminophen  1 tablet Oral Q6H WA   And  . oxyCODONE  5 mg Oral Q6H WA  . pravastatin  40 mg Oral q1800  . ruxolitinib phosphate  5 mg Oral Daily  . sevelamer carbonate  800 mg Oral TID WC  . sodium chloride flush  3 mL Intravenous Q12H  . traMADol  50 mg Oral Q6H    BMP Latest Ref Rng & Units 09/14/2019 08/24/2019 07/26/2019  Glucose 70 - 99 mg/dL 139(H) 119(H) 136(H)  BUN 8 - 23 mg/dL 45(H) 25(H) 87(H)  Creatinine 0.61 - 1.24 mg/dL 4.19(H) 4.13(H) 7.19(H)  Sodium 135 - 145 mmol/L 137 143 131(L)  Potassium 3.5 - 5.1 mmol/L 4.0 4.5 4.7  Chloride 98 - 111 mmol/L 102 102 96(L)  CO2 22 - 32 mmol/L 25 30 20(L)  Calcium 8.9 - 10.3 mg/dL 8.4(L) 8.6(L) 7.6(L)   CBC Latest Ref Rng & Units 09/14/2019 08/24/2019 07/26/2019  WBC 4.0 - 10.5 K/uL 8.2 8.5 12.4(H)  Hemoglobin 13.0 - 17.0 g/dL 9.0(L) 10.8(L) 10.8(L)  Hematocrit 39.0 - 52.0 % 27.5(L) 33.5(L) 31.6(L)  Platelets 150 - 400 K/uL 67(L) 38(L)  126(L)     CT Angio Head W/Cm &/Or Wo Cm  Result Date: 09/14/2019 CLINICAL DATA:  Subarachnoid hemorrhage [facility]) suspected. Additional history provided: Patient fell 2 weeks ago and again 2 days ago, does not remember if head trauma, denies loss of consciousness, double vision and headaches. EXAM: CT ANGIOGRAPHY HEAD TECHNIQUE: Multidetector CT imaging of the head was performed using the standard protocol during bolus administration of intravenous contrast. Multiplanar CT image reconstructions and MIPs were obtained to evaluate the vascular anatomy. CONTRAST:  179m OMNIPAQUE IOHEXOL 350 MG/ML SOLN COMPARISON:  Noncontrast head CT performed earlier the same day prior noncontrast head CT examinations 09/14/2019 and earlier FINDINGS: CT HEAD Brain: Again demonstrated is subarachnoid hemorrhage within the prepontine cistern, eccentric to the left. The hemorrhage has not significantly changed in size or extent from  head CT performed earlier the same day, again measuring up to 15 mm in greatest thickness (remeasured on prior). Unchanged mild mass effect upon the pons. Unchanged small volume intraventricular hemorrhage within the dependent lateral ventricles. The ventricles are unchanged in size and configuration without evidence of hydrocephalus. Redemonstrated focus of encephalomalacia within the anterolateral right temporal lobe, which may be posttraumatic or post ischemic in etiology. 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 Ill-defined hypodensity within the cerebral white matter is nonspecific, but consistent with chronic small vessel ischemic disease. Stable, mild generalized parenchymal atrophy. Vascular: Reported separately. Skull: Normal. Negative for fracture or focal lesion. Sinuses: No significant paranasal sinus disease or mastoid effusion at the imaged levels. Orbits: Visualized orbits demonstrate no acute abnormality. CTA HEAD Anterior circulation: The intracranial internal  carotid arteries are patent with prominent atherosclerotic calcification. Sites of mild to moderate stenosis within the right ICA. Sites of narrowing within the left ICA, greatest within the paraclinoid segment (at least moderate in severity at this site). The M1 middle cerebral arteries are patent without significant stenosis. No M2 proximal branch occlusion or high-grade proximal stenosis is identified. The anterior cerebral arteries are patent without high-grade proximal stenosis. No anterior circulation aneurysm is identified Posterior circulation: The non dominant intracranial right vertebral artery is developmentally diminutive beyond the origin of the right PICA, although patent. The dominant intracranial left vertebral artery is patent. Calcified plaque in the V4 left vertebral artery is incompletely imaged but contributes to moderate stenosis at the visualized levels. The basilar artery is patent without significant stenosis. The posterior cerebral arteries are patent bilaterally. Mild narrowing within the proximal P2 right PCA. There is a sizable left posterior communicating artery with hypoplastic left P1 segment. A right posterior communicating artery is not definitive identified. No posterior circulation aneurysm is identified. Venous sinuses: Within limitations of contrast timing, no convincing thrombus. Anatomic variants: As described IMPRESSION: CT head: 1. Unchanged prepontine subarachnoid hemorrhage and small volume hemorrhage layering within the lateral ventricles. No evidence of interval hemorrhage. No evidence of hydrocephalus on the current exam. 2. Additional chronic findings without interval change as described. CTA head: 1. No intracranial aneurysm is identified at the imaged levels. 2. Intracranial atherosclerotic disease with multifocal stenoses, most notably as follows. 3. Prominent calcified plaque within the intracranial internal carotid arteries bilaterally. Sites of mild/moderate  stenosis within the right ICA. Narrowing of the intracranial left ICA is greatest within the left paraclinoid segment, at least moderate at this site. 4. Calcified plaque results in moderate stenosis of the dominant V4 left vertebral artery. Electronically Signed   By: Kellie Simmering DO   On: 09/14/2019 12:38   CT HEAD WO CONTRAST  Result Date: 09/15/2019 CLINICAL DATA:  Subarachnoid hemorrhage. EXAM: CT HEAD WITHOUT CONTRAST TECHNIQUE: Contiguous axial images were obtained from the base of the skull through the vertex without intravenous contrast. COMPARISON:  CT angiogram head 09/14/2019, head CT 09/14/2019 FINDINGS: Brain: Subarachnoid hemorrhage within the prepontine cistern, eccentric to the left, has not significantly changed in extent as compared to prior examination 09/14/2019. This again measures up to 15 mm in greatest thickness. Similar appearance of mild mass effect upon the left anterolateral pons. Again demonstrated is small volume hemorrhage layering dependently within the lateral ventricles. Hemorrhage within the right lateral ventricle as minimally increased, likely due to redistribution. The ventricles are unchanged in size and configuration without evidence of hydrocephalus. No acute demarcated cortical infarct. Redemonstrated chronic encephalomalacia within the anterolateral right temporal lobe, which  may be posttraumatic or post-ischemic in etiology. Redemonstrated chronic infarcts within the bilateral basal ganglia and left cerebellum. Ill-defined hypoattenuation within the cerebral white matter is nonspecific, but consistent with chronic small vessel ischemic disease. Stable, mild generalized parenchymal atrophy. Vascular: No hyperdense vessel.  Atherosclerotic calcifications. Skull: Normal. Negative for fracture or focal lesion. Sinuses/Orbits: Visualized orbits demonstrate no acute abnormality. No significant paranasal sinus disease or mastoid effusion at the imaged levels. IMPRESSION: 1.  Prepontine subarachnoid hemorrhage has not significantly changed since prior exam 09/14/2019. Mild mass effect upon the pons. Redemonstrated small volume hemorrhage layering within the lateral ventricles. Hemorrhage within the right ventricle has minimally increased, likely due to redistribution. No evidence of hydrocephalus. 2. Additional chronic findings without interval change as described. Electronically Signed   By: Kellie Simmering DO   On: 09/15/2019 11:06   CT HEAD WO CONTRAST  Result Date: 09/14/2019 CLINICAL DATA:  Fall at dialysis with headache EXAM: CT HEAD WITHOUT CONTRAST CT CERVICAL SPINE WITHOUT CONTRAST TECHNIQUE: Multidetector CT imaging of the head and cervical spine was performed following the standard protocol without intravenous contrast. Multiplanar CT image reconstructions of the cervical spine were also generated. COMPARISON:  None. FINDINGS: CT HEAD FINDINGS Brain: Left para median prepontine subarachnoid hemorrhage measuring 11 mm thickness. There is a reported left 6 nerve palsy which correlates with this location. Intraventricular hemorrhage is seen layering in the lateral ventricles, likely intraventricular reflux. There is history of syncope and fall. Encephalomalacia in the inferior right temporal lobe which could be posttraumatic or ischemic. There have been remote small vessel infarcts at the basal ganglia. No hydrocephalus when accounting for atrophy. Vascular: No hyperdense vessel or unexpected calcification. Skull: Normal. Negative for fracture or focal lesion. Sinuses/Orbits: Negative Critical Value/emergent results were called by telephone at the time of interpretation on 09/14/2019 at 10:26 am to provider [facility] , who verbally acknowledged these results. CT CERVICAL SPINE FINDINGS Alignment: No traumatic malalignment Skull base and vertebrae: Negative for acute fracture Soft tissues and spinal canal: No prevertebral fluid or swelling. No visible canal hematoma. Disc  levels: Lower cervical disc and upper cervical facet degeneration. Upper chest: Negative IMPRESSION: 1. Subarachnoid hemorrhage in the prepontine cistern that is left eccentric and fairly focal/thick. Reportedly there was preceding syncope and fall. 2. Small volume hemorrhage layering in the lateral ventricles. 3. Negative for cervical spine fracture. 4. Remote small vessel infarcts. Electronically Signed   By: Monte Fantasia M.D.   On: 09/14/2019 10:29   CT CERVICAL SPINE WO CONTRAST  Result Date: 09/14/2019 CLINICAL DATA:  Fall at dialysis with headache EXAM: CT HEAD WITHOUT CONTRAST CT CERVICAL SPINE WITHOUT CONTRAST TECHNIQUE: Multidetector CT imaging of the head and cervical spine was performed following the standard protocol without intravenous contrast. Multiplanar CT image reconstructions of the cervical spine were also generated. COMPARISON:  None. FINDINGS: CT HEAD FINDINGS Brain: Left para median prepontine subarachnoid hemorrhage measuring 11 mm thickness. There is a reported left 6 nerve palsy which correlates with this location. Intraventricular hemorrhage is seen layering in the lateral ventricles, likely intraventricular reflux. There is history of syncope and fall. Encephalomalacia in the inferior right temporal lobe which could be posttraumatic or ischemic. There have been remote small vessel infarcts at the basal ganglia. No hydrocephalus when accounting for atrophy. Vascular: No hyperdense vessel or unexpected calcification. Skull: Normal. Negative for fracture or focal lesion. Sinuses/Orbits: Negative Critical Value/emergent results were called by telephone at the time of interpretation on 09/14/2019 at 10:26 am to provider [facility]  [facility] , who verbally acknowledged these results. CT CERVICAL SPINE FINDINGS Alignment: No traumatic malalignment Skull base and vertebrae: Negative for acute fracture Soft tissues and spinal canal: No prevertebral fluid or swelling. No visible canal hematoma. Disc  levels: Lower cervical disc and upper cervical facet degeneration. Upper chest: Negative IMPRESSION: 1. Subarachnoid hemorrhage in the prepontine cistern that is left eccentric and fairly focal/thick. Reportedly there was preceding syncope and fall. 2. Small volume hemorrhage layering in the lateral ventricles. 3. Negative for cervical spine fracture. 4. Remote small vessel infarcts. Electronically Signed   By: Monte Fantasia M.D.   On: 09/14/2019 10:29   US Paracentesis  Result Date: 09/14/2019 INDICATION: Cirrhosis Recurrent ascites EXAM: ULTRASOUND GUIDED  PARACENTESIS MEDICATIONS: 10 cc 1% lidocaine COMPLICATIONS: None immediate. PROCEDURE: Informed written consent was obtained from the patient after a discussion of the risks, benefits and alternatives to treatment. A timeout was performed prior to the initiation of the procedure. Initial ultrasound scanning demonstrates a large amount of ascites within the right lower abdominal quadrant. The right lower abdomen was prepped and draped in the usual sterile fashion. 1% lidocaine was used for local anesthesia. Following this, a 19 g Yueh catheter was introduced. An ultrasound image was saved for documentation purposes. The paracentesis was performed. The catheter was removed and a dressing was applied. The patient tolerated the procedure well without immediate post procedural complication. FINDINGS: A total of approximately 4.3 liters of cloudy milky fluid was removed. Samples were sent to the laboratory as requested by the clinical team. IMPRESSION: Successful ultrasound-guided paracentesis yielding 4.3 liters of peritoneal fluid. Read by Lavonia Drafts [facility] Electronically Signed   By: Lavonia Dana M.D.   On: 09/14/2019 09:07    Review of Systems  Constitutional: Positive for fatigue. Negative for appetite change, chills and fever.  HENT: Negative.   Eyes: Positive for visual disturbance.       Wearing a patch over left eye  Respiratory: Negative.    Cardiovascular: Negative.   Gastrointestinal: Positive for abdominal distention and abdominal pain. Negative for constipation, diarrhea, nausea and vomiting.  Endocrine: Negative.   Genitourinary:       Marginal urine output  Musculoskeletal: Positive for gait problem.  Neurological: Positive for dizziness, weakness, light-headedness and headaches.   Blood pressure 132/69, pulse 66, temperature 98.7 F (37.1 C), temperature source Oral, resp. rate 17, height 6' (1.829 m), weight 80.3 kg, SpO2 97 %. Physical Exam  Nursing note and vitals reviewed. Constitutional: He appears well-developed and well-nourished. No distress.  HENT:  Head: Normocephalic and atraumatic.  Mouth/Throat: Oropharynx is clear and moist.  Eyes: Right eye exhibits no discharge. No scleral icterus.  Wearing patch over left eye  Neck: No JVD present.  Cardiovascular: Normal rate, regular rhythm and normal heart sounds.  No murmur heard. Respiratory: Effort normal and breath sounds normal. He has no wheezes. He has no rales.  GI: He exhibits distension. There is abdominal tenderness.  Firm abdomen with mild to moderate global distention  Musculoskeletal:        General: No edema.     Cervical back: Normal range of motion and neck supple.     Comments: Left radiocephalic fistula with intact dressings.  Neurological: He is alert.  Skin: Skin is warm and dry. There is pallor.    Assessment/Plan: 1.  Subarachnoid hemorrhage: With evidence of posterior fossa bleeding and left abducens nerve palsy currently wearing an eye patch to alleviate diplopia.  Awaiting cerebral angiogram Monday 3/1 per  neurosurgical note. 2.  End-stage renal disease: Usually on hemodialysis on a Monday/Wednesday/Friday schedule.  He reports his estimated dry weight to being 77 kg with a dialysis time of 4 hours.  His electrolytes from yesterday's labs appear to be noncritical and his volume status is close to euvolemic on exam.  I will order for  a shorter 3-hour dialysis today and regular 4-hour dialysis tomorrow (heparin free) with predialysis weights.  Will attempt to obtain records from his outpatient dialysis unit. 3.  Hypertension: Blood pressure currently acceptable, monitor with hemodialysis/ultrafiltration. 4.  Anemia of chronic disease including ESRD and myelodysplastic syndrome.  Will obtain records from his outpatient dialysis unit to verify ESA dosing.  With [facility]. 5.  Secondary hyperparathyroidism: Resume renal/low phosphorus diet and binders with sevelamer.  Check phosphorus with labs today.  Stuti Sandin K. 09/15/2019, 1:10 PM

## 2019-09-16 ENCOUNTER — Ambulatory Visit (HOSPITAL_COMMUNITY): Admission: RE | Admit: 2019-09-16 | Payer: Medicare Other | Source: Ambulatory Visit

## 2019-09-16 LAB — CBC
HCT: 23.6 % — ABNORMAL LOW (ref 39.0–52.0)
Hemoglobin: 7.8 g/dL — ABNORMAL LOW (ref 13.0–17.0)
MCH: 31 pg (ref 26.0–34.0)
MCHC: 33.1 g/dL (ref 30.0–36.0)
MCV: 93.7 fL (ref 80.0–100.0)
Platelets: 60 10*3/uL — ABNORMAL LOW (ref 150–400)
RBC: 2.52 MIL/uL — ABNORMAL LOW (ref 4.22–5.81)
RDW: 21.2 % — ABNORMAL HIGH (ref 11.5–15.5)
WBC: 6.2 10*3/uL (ref 4.0–10.5)
nRBC: 2.4 % — ABNORMAL HIGH (ref 0.0–0.2)

## 2019-09-16 LAB — GLUCOSE, CAPILLARY
Glucose-Capillary: 122 mg/dL — ABNORMAL HIGH (ref 70–99)
Glucose-Capillary: 133 mg/dL — ABNORMAL HIGH (ref 70–99)
Glucose-Capillary: 157 mg/dL — ABNORMAL HIGH (ref 70–99)
Glucose-Capillary: 125 mg/dL — ABNORMAL HIGH (ref 70–99)

## 2019-09-16 LAB — HEPATITIS B E ANTIBODY: Hep B E Ab: NEGATIVE

## 2019-09-16 LAB — BASIC METABOLIC PANEL
Anion gap: 8 (ref 5–15)
BUN: 49 mg/dL — ABNORMAL HIGH (ref 8–23)
CO2: 23 mmol/L (ref 22–32)
Calcium: 7.9 mg/dL — ABNORMAL LOW (ref 8.9–10.3)
Chloride: 102 mmol/L (ref 98–111)
Creatinine, Ser: 5.31 mg/dL — ABNORMAL HIGH (ref 0.61–1.24)
GFR calc Af Amer: 11 mL/min — ABNORMAL LOW (ref >60)
GFR calc non Af Amer: 10 mL/min — ABNORMAL LOW (ref >60)
Glucose, Bld: 138 mg/dL — ABNORMAL HIGH (ref 70–99)
Potassium: 4.5 mmol/L (ref 3.5–5.1)
Sodium: 133 mmol/L — ABNORMAL LOW (ref 135–145)

## 2019-09-16 LAB — HEPATITIS B E ANTIGEN: Hep B E Ag: NEGATIVE

## 2019-09-16 MED ORDER — OXYMETAZOLINE HCL 0.05 % NA SOLN
1.0000 | Freq: Two times a day (BID) | NASAL | Status: DC | PRN
  Administered 2019-09-17: 09:00:00 1 via NASAL
  Filled 2019-09-16: qty 30

## 2019-09-16 MED ORDER — MUPIROCIN 2 % EX OINT
TOPICAL_OINTMENT | Freq: Two times a day (BID) | CUTANEOUS | Status: DC
  Filled 2019-09-16: qty 22

## 2019-09-16 MED ORDER — DOXERCALCIFEROL 0.5 MCG PO CAPS
1.0000 ug | ORAL_CAPSULE | ORAL | Status: DC
  Filled 2019-09-16: qty 2

## 2019-09-16 NOTE — Progress Notes (Signed)
Pt complains of nosebleed. Minimal bleeding present on assessment. Pt states this has been happening on and off for "a few days." Provider aware.

## 2019-09-16 NOTE — Progress Notes (Signed)
[facility] PROGRESS NOTE    Progress Note  [name]  WNU:[id number] DOB: 03-30-1946 DOA: 09/14/2019 PCP: [name], MD     Brief Narrative:   [name] is an 74 y.o. 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  Apparently had a brief syncopal episode after his dialysis on 09/12/2019.  ct of the head done on admission showed subarachnoid hemorrhage in the prepontine cistern.  The emergency room physician consulted neurosurgery who recommended transfer from [facility] to [facility].  Assessment/Plan:   Subarachnoid hemorrhage with small hemorrhage in the lateral ventricles with 6th nerve palsy: Repeat CT of the head on 09/15/2019 show prepontine subarachnoid hemorrhage has not changed since prior exam mild mass-effect upon pons.   Scheduled for angiogram 1 09/19/2019 continue to hold anticoagulation and antiplatelet therapy. which is pending at the time of this dictation and consult IR for possible angiogram to rule out an aneurysm which would be done on 09/19/2019. Patient refused dialysis and labs this morning have explained to him the risk and benefits of doing this he understands and he is conscious still he does not want dialysis today.  He said he will think about labs.  Liver cirrhosis with ascites status post paracentesis on 09/14/2019: On admission he had 4.3 L removed.  Continue fluid restrictions and diuretic therapy.  Thrombocytopenia: In the setting of cirrhosis and myelodysplastic syndrome.   CBC is pending today, he refused labs this morning.  End-stage renal disease on hemodialysis Monday Wednesdays and Fridays: Last hemodialysis on  09/12/2019, he refused dialysis this morning we will try to get a basic metabolic panel, see above for further details.  CAD with prior stent: Continue pravastatin and labetalol.  Myelodysplastic syndrome: Follow-up with his oncologist as an outpatient continue to follow CBC closely his platelet count is down.  Diabetes mellitus type 2, controlled: Continue carb modified diet and sliding scale blood glucose is fairly controlled.  Recent COVID-19 pneumonia: He is currently asymptomatic no further isolation needed.  GERD: Continue PPI.  Essential hypertension: Continue labetalol pressure seems to be fairly controlled we will keep a close eye on blood pressure and try to keep it below 180/90 due to his subarachnoid hemorrhage.    DVT prophylaxis: scd's Family Communication:none Disposition Plan/Barrier to D/C: Unable to determine per neurosurgery  Code Status:     Code Status Orders  (From admission, onward)         Start     Ordered   09/14/19 1243  Full code  Continuous     09/14/19 1243        Code Status History    Date Active Date Inactive Code Status Order ID Comments User Context   07/23/2019 2222 07/26/2019 2156 Full Code [id number]  Truett Mainland, DO Inpatient   04/19/2019 2332 04/21/2019 2005 Full Code [id number]  Vianne Bulls, MD ED   09/15/2016 1159 09/16/2016 1706 Full Code [id number]  Eber Jones, MD Inpatient   08/12/2016 0927 08/14/2016 1649 Full Code [id number]  Jettie Booze, MD Inpatient   04/25/2016 1618 05/04/2016 1716 Full Code [id number]  Oswald Hillock, MD Inpatient   04/15/2016 1549 04/16/2016 2018 Full Code [id number]  Kathie Dike, MD Inpatient  Advance Care Planning Activity        IV Access:    Peripheral IV   Procedures and diagnostic studies:   CT Angio Head W/Cm &/Or Wo Cm  Result Date: 09/14/2019 CLINICAL DATA:  Subarachnoid hemorrhage [facility]) suspected. Additional history provided: Patient fell 2 weeks ago and again 2  days ago, does not remember if head trauma, denies loss of consciousness, double vision and headaches. EXAM: CT ANGIOGRAPHY HEAD TECHNIQUE: Multidetector CT imaging of the head was performed using the standard protocol during bolus administration of intravenous contrast. Multiplanar CT image reconstructions and MIPs were obtained to evaluate the vascular anatomy. CONTRAST:  127mL OMNIPAQUE IOHEXOL 350 MG/ML SOLN COMPARISON:  Noncontrast head CT performed earlier the same day prior noncontrast head CT examinations 09/14/2019 and earlier FINDINGS: CT HEAD Brain: Again demonstrated is subarachnoid hemorrhage within the prepontine cistern, eccentric to the left. The hemorrhage has not significantly changed in size or extent from head CT performed earlier the same day, again measuring up to 15 mm in greatest thickness (remeasured on prior). Unchanged mild mass effect upon the pons. Unchanged small volume intraventricular hemorrhage within the dependent lateral ventricles. The ventricles are unchanged in size and configuration without evidence of hydrocephalus. Redemonstrated focus of encephalomalacia within the anterolateral right temporal lobe, which may be posttraumatic or post ischemic in etiology. 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 Ill-defined hypodensity within the cerebral white matter is nonspecific, but consistent with chronic small vessel ischemic disease. Stable, mild generalized parenchymal atrophy. Vascular: Reported separately. Skull: Normal. Negative for fracture or focal lesion. Sinuses: No significant paranasal sinus disease or mastoid effusion at the imaged levels. Orbits: Visualized orbits demonstrate no acute abnormality. CTA HEAD Anterior circulation: The intracranial internal carotid arteries are patent with prominent atherosclerotic calcification. Sites of mild to moderate stenosis within the right ICA. Sites of narrowing within the left ICA, greatest within the  paraclinoid segment (at least moderate in severity at this site). The M1 middle cerebral arteries are patent without significant stenosis. No M2 proximal branch occlusion or high-grade proximal stenosis is identified. The anterior cerebral arteries are patent without high-grade proximal stenosis. No anterior circulation aneurysm is identified Posterior circulation: The non dominant intracranial right vertebral artery is developmentally diminutive beyond the origin of the right PICA, although patent. The dominant intracranial left vertebral artery is patent. Calcified plaque in the V4 left vertebral artery is incompletely imaged but contributes to moderate stenosis at the visualized levels. The basilar artery is patent without significant stenosis. The posterior cerebral arteries are patent bilaterally. Mild narrowing within the proximal P2 right PCA. There is a sizable left posterior communicating artery with hypoplastic left P1 segment. A right posterior communicating artery is not definitive identified. No posterior circulation aneurysm is identified. Venous sinuses: Within limitations of contrast timing, no convincing thrombus. Anatomic variants: As described IMPRESSION: CT head: 1. Unchanged prepontine subarachnoid hemorrhage and small volume hemorrhage layering within the lateral ventricles. No evidence of interval hemorrhage. No evidence of hydrocephalus on the current exam. 2. Additional chronic findings without interval change as described. CTA head: 1. No intracranial aneurysm is identified at the imaged levels. 2. Intracranial atherosclerotic disease with multifocal stenoses, most notably as follows. 3. Prominent calcified plaque within the intracranial internal carotid arteries bilaterally. Sites of mild/moderate stenosis within the right ICA. Narrowing of the intracranial left ICA is greatest within the left paraclinoid segment, at least moderate at this site. 4. Calcified plaque results in moderate  stenosis of the dominant  V4 left vertebral artery. Electronically Signed   By: Kellie Simmering DO   On: 09/14/2019 12:38   CT HEAD WO CONTRAST  Result Date: 09/15/2019 CLINICAL DATA:  Subarachnoid hemorrhage. EXAM: CT HEAD WITHOUT CONTRAST TECHNIQUE: Contiguous axial images were obtained from the base of the skull through the vertex without intravenous contrast. COMPARISON:  CT angiogram head 09/14/2019, head CT 09/14/2019 FINDINGS: Brain: Subarachnoid hemorrhage within the prepontine cistern, eccentric to the left, has not significantly changed in extent as compared to prior examination 09/14/2019. This again measures up to 15 mm in greatest thickness. Similar appearance of mild mass effect upon the left anterolateral pons. Again demonstrated is small volume hemorrhage layering dependently within the lateral ventricles. Hemorrhage within the right lateral ventricle as minimally increased, likely due to redistribution. The ventricles are unchanged in size and configuration without evidence of hydrocephalus. No acute demarcated cortical infarct. Redemonstrated chronic encephalomalacia within the anterolateral right temporal lobe, which may be posttraumatic or post-ischemic in etiology. Redemonstrated chronic infarcts within the bilateral basal ganglia and left cerebellum. Ill-defined hypoattenuation within the cerebral white matter is nonspecific, but consistent with chronic small vessel ischemic disease. Stable, mild generalized parenchymal atrophy. Vascular: No hyperdense vessel.  Atherosclerotic calcifications. Skull: Normal. Negative for fracture or focal lesion. Sinuses/Orbits: Visualized orbits demonstrate no acute abnormality. No significant paranasal sinus disease or mastoid effusion at the imaged levels. IMPRESSION: 1. Prepontine subarachnoid hemorrhage has not significantly changed since prior exam 09/14/2019. Mild mass effect upon the pons. Redemonstrated small volume hemorrhage layering within the  lateral ventricles. Hemorrhage within the right ventricle has minimally increased, likely due to redistribution. No evidence of hydrocephalus. 2. Additional chronic findings without interval change as described. Electronically Signed   By: Kellie Simmering DO   On: 09/15/2019 11:06   CT HEAD WO CONTRAST  Result Date: 09/14/2019 CLINICAL DATA:  Fall at dialysis with headache EXAM: CT HEAD WITHOUT CONTRAST CT CERVICAL SPINE WITHOUT CONTRAST TECHNIQUE: Multidetector CT imaging of the head and cervical spine was performed following the standard protocol without intravenous contrast. Multiplanar CT image reconstructions of the cervical spine were also generated. COMPARISON:  None. FINDINGS: CT HEAD FINDINGS Brain: Left para median prepontine subarachnoid hemorrhage measuring 11 mm thickness. There is a reported left 6 nerve palsy which correlates with this location. Intraventricular hemorrhage is seen layering in the lateral ventricles, likely intraventricular reflux. There is history of syncope and fall. Encephalomalacia in the inferior right temporal lobe which could be posttraumatic or ischemic. There have been remote small vessel infarcts at the basal ganglia. No hydrocephalus when accounting for atrophy. Vascular: No hyperdense vessel or unexpected calcification. Skull: Normal. Negative for fracture or focal lesion. Sinuses/Orbits: Negative Critical Value/emergent results were called by telephone at the time of interpretation on 09/14/2019 at 10:26 am to provider [facility] , who verbally acknowledged these results. CT CERVICAL SPINE FINDINGS Alignment: No traumatic malalignment Skull base and vertebrae: Negative for acute fracture Soft tissues and spinal canal: No prevertebral fluid or swelling. No visible canal hematoma. Disc levels: Lower cervical disc and upper cervical facet degeneration. Upper chest: Negative IMPRESSION: 1. Subarachnoid hemorrhage in the prepontine cistern that is left eccentric and fairly  focal/thick. Reportedly there was preceding syncope and fall. 2. Small volume hemorrhage layering in the lateral ventricles. 3. Negative for cervical spine fracture. 4. Remote small vessel infarcts. Electronically Signed   By: Monte Fantasia M.D.   On: 09/14/2019 10:29   CT CERVICAL SPINE WO CONTRAST  Result Date: 09/14/2019 CLINICAL DATA:  Fall at dialysis with headache EXAM: CT HEAD WITHOUT CONTRAST CT CERVICAL SPINE WITHOUT CONTRAST TECHNIQUE: Multidetector CT imaging of the head and cervical spine was performed following the standard protocol without intravenous contrast. Multiplanar CT image reconstructions of the cervical spine were also generated. COMPARISON:  None. FINDINGS: CT HEAD FINDINGS Brain: Left para median prepontine subarachnoid hemorrhage measuring 11 mm thickness. There is a reported left 6 nerve palsy which correlates with this location. Intraventricular hemorrhage is seen layering in the lateral ventricles, likely intraventricular reflux. There is history of syncope and fall. Encephalomalacia in the inferior right temporal lobe which could be posttraumatic or ischemic. There have been remote small vessel infarcts at the basal ganglia. No hydrocephalus when accounting for atrophy. Vascular: No hyperdense vessel or unexpected calcification. Skull: Normal. Negative for fracture or focal lesion. Sinuses/Orbits: Negative Critical Value/emergent results were called by telephone at the time of interpretation on 09/14/2019 at 10:26 am to provider [facility] , who verbally acknowledged these results. CT CERVICAL SPINE FINDINGS Alignment: No traumatic malalignment Skull base and vertebrae: Negative for acute fracture Soft tissues and spinal canal: No prevertebral fluid or swelling. No visible canal hematoma. Disc levels: Lower cervical disc and upper cervical facet degeneration. Upper chest: Negative IMPRESSION: 1. Subarachnoid hemorrhage in the prepontine cistern that is left eccentric and fairly  focal/thick. Reportedly there was preceding syncope and fall. 2. Small volume hemorrhage layering in the lateral ventricles. 3. Negative for cervical spine fracture. 4. Remote small vessel infarcts. Electronically Signed   By: Monte Fantasia M.D.   On: 09/14/2019 10:29     Medical Consultants:    None.  Anti-Infectives:   None  Subjective:    [name] not in a good mood this morning grumpy.  Objective:    Vitals:   09/16/19 0100 09/16/19 0200 09/16/19 0433 09/16/19 0858  BP:   (!) 150/70 (!) 143/70  Pulse: 79 68 71 73  Resp: (!) 21 (!) 21 20 20   Temp:   98.5 F (36.9 C) (!) 97.4 F (36.3 C)  TempSrc:   Oral Oral  SpO2: 95% 93% 98% 98%  Weight:      Height:       SpO2: 98 %   Intake/Output Summary (Last 24 hours) at 09/16/2019 0949 Last data filed at 09/15/2019 1100 Gross per 24 hour  Intake 400 ml  Output --  Net 400 ml   Filed Weights   09/14/19 0911 09/15/19 1449  Weight: 80.3 kg 77 kg    Exam: General exam: In no acute distress, belligerent and grumpy this morning. Respiratory system: Good air movement and clear to auscultation. Cardiovascular system: S1 & S2 heard, RRR. No JVD. Central nervous system: Alert and oriented.  Extremities: No pedal edema. Skin: No rashes, lesions or ulcers   Data Reviewed:    Labs: Basic Metabolic Panel: Recent Labs  Lab 09/14/19 0928  NA 137  K 4.0  CL 102  CO2 25  GLUCOSE 139*  BUN 45*  CREATININE 4.19*  CALCIUM 8.4*   GFR Estimated Creatinine Clearance: 16.8 mL/min (A) (by C-G formula based on SCr of 4.19 mg/dL (H)). Liver Function Tests: Recent Labs  Lab 09/14/19 0928  AST 37  ALT 34  ALKPHOS 173*  BILITOT 0.5  PROT 5.4*  ALBUMIN 2.7*   No results for input(s): LIPASE, AMYLASE in the last 168 hours. No results for input(s): AMMONIA in the last 168 hours. Coagulation profile Recent Labs  Lab 09/14/19 0928  INR 1.0  COVID-19 Labs  No results for input(s): DDIMER, FERRITIN, LDH,  CRP in the last 72 hours.  Lab Results  Component Value Date   SARSCOV2NAA POSITIVE (A) 09/14/2019   [place] NEGATIVE 04/19/2019   [place] NEGATIVE 04/12/2019    CBC: Recent Labs  Lab 09/14/19 0928  WBC 8.2  HGB 9.0*  HCT 27.5*  MCV 96.5  PLT 67*   Cardiac Enzymes: No results for input(s): CKTOTAL, CKMB, CKMBINDEX, TROPONINI in the last 168 hours. BNP (last 3 results) No results for input(s): PROBNP in the last 8760 hours. CBG: Recent Labs  Lab 09/15/19 0624 09/15/19 1155 09/15/19 1618 09/15/19 2107 09/16/19 0929  GLUCAP 78 86 107* 113* 122*   D-Dimer: No results for input(s): DDIMER in the last 72 hours. Hgb A1c: Recent Labs    09/14/19 0928  HGBA1C 4.7*   Lipid Profile: No results for input(s): CHOL, HDL, LDLCALC, TRIG, CHOLHDL, LDLDIRECT in the last 72 hours. Thyroid function studies: No results for input(s): TSH, T4TOTAL, T3FREE, THYROIDAB in the last 72 hours.  Invalid input(s): FREET3 Anemia work up: No results for input(s): VITAMINB12, FOLATE, FERRITIN, TIBC, IRON, RETICCTPCT in the last 72 hours. Sepsis Labs: Recent Labs  Lab 09/14/19 0928  WBC 8.2   Microbiology Recent Results (from the past 240 hour(s))  Gram stain     Status: None   Collection Time: 09/09/19  9:08 AM   Specimen: Peritoneal Washings  Result Value Ref Range Status   Specimen Description PERITONEAL  Final   Special Requests NONE  Final   Gram Stain   Final    NO ORGANISMS SEEN WBC PRESENT, PREDOMINANTLY MONONUCLEAR CYTOSPIN SMEAR Performed at [facility] : [facility], [address], [place], [place] [address]    Report Status 09/09/2019 FINAL  Final  Culture, body fluid-bottle     Status: None   Collection Time: 09/09/19  9:08 AM   Specimen: Peritoneal Washings  Result Value Ref Range Status   Specimen Description   Final    PERITONEAL Performed at [facility], [address], [place], [place] [address]    Special Requests   Final    BOTTLES DRAWN AEROBIC AND  ANAEROBIC 10 CC Performed at [facility], [address], [place], [place] [address]    Culture   Final    NO GROWTH 5 DAYS Performed at [facility], [place] [address], [place], [place] [address]    Report Status 09/14/2019 FINAL  Final  Culture, body fluid-bottle     Status: None (Preliminary result)   Collection Time: 09/14/19  8:24 AM   Specimen: Ascitic  Result Value Ref Range Status   Specimen Description ASCITIC  Final   Special Requests BOTTLES DRAWN AEROBIC AND ANAEROBIC 10CC  Final   Culture   Final    NO GROWTH 2 DAYS Performed at [facility], [address], [place], [place] [address]    Report Status PENDING  Incomplete  Gram stain     Status: None   Collection Time: 09/14/19  8:24 AM   Specimen: Ascitic  Result Value Ref Range Status   Specimen Description ASCITIC  Final   Special Requests NONE  Final   Gram Stain   Final    NO ORGANISMS SEEN WBC PRESENT, PREDOMINANTLY MONONUCLEAR CYTOSPIN SMEAR Performed at [facility], [address] [place], [place] [address]    Report Status 09/14/2019 FINAL  Final  Respiratory Panel by RT PCR (Flu A&B, Covid) - Nasopharyngeal Swab     Status: Abnormal  Collection Time: 09/14/19 11:35 AM   Specimen: Nasopharyngeal Swab  Result Value Ref Range Status   SARS Coronavirus 2 by RT PCR POSITIVE (A) NEGATIVE Final    Comment: RESULT CALLED TO, READ BACK BY AND VERIFIED WITH: KENDRICK J. AT [id number] ON [id number] BY THOMPSON S. (NOTE) SARS-CoV-2 target nucleic acids are DETECTED. SARS-CoV-2 RNA is generally detectable in upper respiratory specimens  during the acute phase of infection. Positive results are indicative of the presence of the identified virus, but do not rule out bacterial infection or co-infection with other pathogens not detected by the test. Clinical correlation with patient history and other diagnostic information is necessary to determine patient infection status. The expected result is Negative. Fact Sheet  for Patients:  P[place]Cheek.be Fact Sheet for Healthcare Providers: GravelBags.it This test is not yet approved or cleared by the [place] FDA and  has been authorized for detection and/or diagnosis of SARS-CoV-2 by FDA under an Emergency Use Authorization (EUA).  This EUA will remain in effect (meaning this test can b e used) for the duration of  the COVID-19 declaration under Section 564(b)(1) of the Act, 21 U.S.C. section 360bbb-3(b)(1), unless the authorization is terminated or revoked sooner.    Influenza A by PCR NEGATIVE NEGATIVE Final   Influenza B by PCR NEGATIVE NEGATIVE Final    Comment: (NOTE) The Xpert Xpress SARS-CoV-2/FLU/RSV assay is intended as an aid in  the diagnosis of influenza from Nasopharyngeal swab specimens and  should not be used as a sole basis for treatment. Nasal washings and  aspirates are unacceptable for Xpert Xpress SARS-CoV-2/FLU/RSV  testing. Fact Sheet for Patients: [place]Cheek.be Fact Sheet for Healthcare Providers: GravelBags.it This test is not yet approved or cleared by the [place] FDA and  has been authorized for detection and/or diagnosis of SARS-CoV-2 by  FDA under an Emergency Use Authorization (EUA). This EUA will remain  in effect (meaning this test can be used) for the duration of the  Covid-19 declaration under Section 564(b)(1) of the Act, 21  U.S.C. section 360bbb-3(b)(1), unless the authorization is  terminated or revoked. Performed at [facility], [address] [place], [place] [address]   MRSA PCR Screening     Status: Abnormal   Collection Time: 09/15/19  3:20 PM   Specimen: Nasal Mucosa; Nasopharyngeal  Result Value Ref Range Status   MRSA by PCR POSITIVE (A) NEGATIVE Final    Comment:        The GeneXpert MRSA Assay (FDA approved for NASAL specimens only), is one component of a comprehensive MRSA  colonization surveillance program. It is not intended to diagnose MRSA infection nor to guide or monitor treatment for MRSA infections. RESULT CALLED TO, READ BACK BY AND VERIFIED WITH: Steffanie Dunn RN 16:30 09/15/19 ([email or web address]) Performed at [facility], [place] [address], [place], [place] [address]      Medications:   . Chlorhexidine Gluconate Cloth  6 each Topical Q0600  . DULoxetine  60 mg Oral Daily  . insulin aspart  0-5 Units Subcutaneous QHS  . insulin aspart  0-6 Units Subcutaneous TID WC  . labetalol  100 mg Oral BID  . mupirocin ointment   Nasal BID  . pravastatin  40 mg Oral q1800  . ruxolitinib phosphate  5 mg Oral Daily  . sevelamer carbonate  800 mg Oral TID WC  . sodium chloride flush  3 mL Intravenous Q12H   Continuous Infusions: . sodium chloride        LOS: 2  days   Charlynne Cousins  [facility]  09/16/2019, 9:49 AM

## 2019-09-16 NOTE — Evaluation (Signed)
Physical Therapy Evaluation Patient Details Name: [name] MRN: [id number] DOB: June 09, 1946 Today's Date: 09/16/2019   History of Present Illness  74 y.o. male admitted on 09/14/19 with HA, double vision.  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  Neurosurgery following, but repeat scans look stable.  s/p paracentesis day of admission 09/14/19.  Other working dx include thrombocytopenia.  Pt with significant PMH of COVID 19 PNA, liver cirrhosis, ESRD on HD M, W, Fr, CAD, myelodysplastic syndrome, DM2, CHF, lumbar disc surgery.   Clinical Impression  Pt was able to walk to the bathroom with RW and min guard assist, we tried without on the way back to the bed and he heeded heavier min assist due to left lateral lean in sitting and standing.  Per daughter, he was leaning to the right yesterday.  He has an eye patch to help with his double vision, but refuses to wear it because it is uncomfortable.  OT asked to see pt to see if there are other options to help treat his double vision and to assist in safely mobilizing the patient.  He will need follow up therapy and he has excellent family support and should be safe to d/c home when he is ready.   PT to follow acutely for deficits listed below.    Follow Up Recommendations Home health PT;Supervision for mobility/OOB    Equipment Recommendations  None recommended by PT    Recommendations for Other Services OT consult     Precautions / Restrictions Precautions Precautions: Fall Precaution Comments: today he lists to the left      Mobility  Bed Mobility Overal bed mobility: Modified Independent                Transfers Overall transfer level: Needs assistance Equipment used: Rolling walker (2 wheeled);None Transfers: Sit to/from Stand Sit to Stand: Min assist         General transfer comment: Min assist from low toilet, min guard assist from bed to RW.   Ambulation/Gait Ambulation/Gait assistance:  Min assist;Min guard Gait Distance (Feet): 20 Feet Assistive device: Rolling walker (2 wheeled) Gait Pattern/deviations: Step-through pattern;Staggering left     General Gait Details: Walked to the bathroom min guard assist with RW, left list was fairly well controlled with AD.  Walked back from the bathroom with hand held assist and pt with min assist for balance due to more significant list to the left.        Modified Rankin (Stroke Patients Only) Modified Rankin (Stroke Patients Only) Pre-Morbid Rankin Score: No symptoms Modified Rankin: Moderately severe disability     Balance Overall balance assessment: Needs assistance Sitting-balance support: Feet supported;Bilateral upper extremity supported Sitting balance-Leahy Scale: Fair Sitting balance - Comments: supervision EOB, pt leans left, can tell me he is leaning left, difficulty correcting.  Postural control: Left lateral lean Standing balance support: Bilateral upper extremity supported;Single extremity supported Standing balance-Leahy Scale: Poor Standing balance comment: needs external support in standing.                              Pertinent Vitals/Pain Pain Assessment: Faces Faces Pain Scale: Hurts even more Pain Location: head Pain Descriptors / Indicators: Aching Pain Intervention(s): Limited activity within patient's tolerance;Monitored during session;Repositioned    Home Living Family/patient expects to be discharged to:: Private residence Living Arrangements: Spouse/significant other Available Help at Discharge: Family;Available 24 hours/day Type of Home: House  Home Access: Stairs to enter   Entrance Stairs-Number of Steps: 2 Home Layout: One level Home Equipment: Walker - 2 wheels;Cane - single point      Prior Function Level of Independence: Independent               Hand Dominance   Dominant Hand: Right    Extremity/Trunk Assessment   Upper Extremity Assessment Upper  Extremity Assessment: Defer to OT evaluation    Lower Extremity Assessment Lower Extremity Assessment: Generalized weakness    Cervical / Trunk Assessment Cervical / Trunk Assessment: Other exceptions Cervical / Trunk Exceptions: h/o lumbar disc surgery, chronic back pain  Communication   Communication: No difficulties  Cognition Arousal/Alertness: Awake/alert Behavior During Therapy: Agitated;Restless;Impulsive Overall Cognitive Status: Impaired/Different from baseline Area of Impairment: Safety/judgement                         Safety/Judgement: Decreased awareness of safety;Decreased awareness of deficits     General Comments: Pt is irritable, impulsive, wants to do it his way despite deficits.  Per chart this mood swing has been prior to his [facility] and family is concerned it is dementia.       General Comments General comments (skin integrity, edema, etc.): left eye is unable to abduct, if left eye is covered double vision goes away, if right eye is covered, still present.  Pt has an eye patch which he refuses to wear because it is too rough.          Assessment/Plan    PT Assessment Patient needs continued PT services  PT Problem List Decreased balance;Decreased mobility;Decreased knowledge of use of DME;Decreased activity tolerance;Decreased cognition;Decreased safety awareness;Pain       PT Treatment Interventions DME instruction;Gait training;Stair training;Functional mobility training;Therapeutic activities;Therapeutic exercise;Balance training;Neuromuscular re-education;Cognitive remediation;Patient/family education    PT Goals (Current goals can be found in the Care Plan section)  Acute Rehab PT Goals Patient Stated Goal: to go home soon PT Goal Formulation: With patient/family Time For Goal Achievement: 09/30/19 Potential to Achieve Goals: Good    Frequency Min 4X/week           AM-PAC PT "6 Clicks" Mobility  Outcome Measure Help needed turning  from your back to your side while in a flat bed without using bedrails?: None Help needed moving from lying on your back to sitting on the side of a flat bed without using bedrails?: None Help needed moving to and from a bed to a chair (including a wheelchair)?: A Little Help needed standing up from a chair using your arms (e.g., wheelchair or bedside chair)?: A Little Help needed to walk in hospital room?: A Little Help needed climbing 3-5 steps with a railing? : A Little 6 Click Score: 20    End of Session   Activity Tolerance: Patient limited by pain Patient left: in bed;with call bell/phone within reach;with family/visitor present   PT Visit Diagnosis: Difficulty in walking, not elsewhere classified (R26.2);Other symptoms and signs involving the nervous system (K74.259)    Time: 5638-7564 PT Time Calculation (min) (ACUTE ONLY): 26 min   Charges:       Verdene Lennert, PT, DPT  Acute Rehabilitation [phone] pager #[phone] office  @ [facility]: [phone]   PT Evaluation $PT Eval Moderate Complexity: 1 Mod PT Treatments $Therapeutic Activity: 8-22 mins       09/16/2019, 6:17 PM

## 2019-09-16 NOTE — Progress Notes (Signed)
Subjective: The patient is alert and pleasant.  He continues to complain of headache and double vision.  Objective: Vital signs in last 24 hours: Temp:  [98.3 F (36.8 C)-99.4 F (37.4 C)] 98.5 F (36.9 C) (02/26 0433) Pulse Rate:  [66-89] 71 (02/26 0433) Resp:  [12-22] 20 (02/26 0433) BP: (132-154)/(68-75) 150/70 (02/26 0433) SpO2:  [93 %-100 %] 98 % (02/26 0433) Weight:  [77 kg] 77 kg (02/25 1449) Estimated body mass index is 23.02 kg/m as calculated from the following:   Height as of this encounter: 6' (1.829 m).   Weight as of this encounter: 77 kg.   Intake/Output from previous day: 02/25 0701 - 02/26 0700 In: 400 [P.O.:400] Out: -  Intake/Output this shift: No intake/output data recorded.  Physical exam the patient is alert and oriented x3.  He is moving all 4 extremities.  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  Lab Results: Recent Labs    09/14/19 0928  WBC 8.2  HGB 9.0*  HCT 27.5*  PLT 67*   BMET Recent Labs    09/14/19 0928  NA 137  K 4.0  CL 102  CO2 25  GLUCOSE 139*  BUN 45*  CREATININE 4.19*  CALCIUM 8.4*    Studies/Results: CT Angio Head W/Cm &/Or Wo Cm  Result Date: 09/14/2019 CLINICAL DATA:  Subarachnoid hemorrhage [facility]) suspected. Additional history provided: Patient fell 2 weeks ago and again 2 days ago, does not remember if head trauma, denies loss of consciousness, double vision and headaches. EXAM: CT ANGIOGRAPHY HEAD TECHNIQUE: Multidetector CT imaging of the head was performed using the standard protocol during bolus administration of intravenous contrast. Multiplanar CT image reconstructions and MIPs were obtained to evaluate the vascular anatomy. CONTRAST:  120mL OMNIPAQUE IOHEXOL 350 MG/ML SOLN COMPARISON:  Noncontrast head CT performed earlier the same day prior noncontrast head CT examinations 09/14/2019 and earlier FINDINGS: CT HEAD Brain: Again demonstrated is subarachnoid hemorrhage within the prepontine cistern, eccentric to  the left. The hemorrhage has not significantly changed in size or extent from head CT performed earlier the same day, again measuring up to 15 mm in greatest thickness (remeasured on prior). Unchanged mild mass effect upon the pons. Unchanged small volume intraventricular hemorrhage within the dependent lateral ventricles. The ventricles are unchanged in size and configuration without evidence of hydrocephalus. Redemonstrated focus of encephalomalacia within the anterolateral right temporal lobe, which may be posttraumatic or post ischemic in etiology. 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 Ill-defined hypodensity within the cerebral white matter is nonspecific, but consistent with chronic small vessel ischemic disease. Stable, mild generalized parenchymal atrophy. Vascular: Reported separately. Skull: Normal. Negative for fracture or focal lesion. Sinuses: No significant paranasal sinus disease or mastoid effusion at the imaged levels. Orbits: Visualized orbits demonstrate no acute abnormality. CTA HEAD Anterior circulation: The intracranial internal carotid arteries are patent with prominent atherosclerotic calcification. Sites of mild to moderate stenosis within the right ICA. Sites of narrowing within the left ICA, greatest within the paraclinoid segment (at least moderate in severity at this site). The M1 middle cerebral arteries are patent without significant stenosis. No M2 proximal branch occlusion or high-grade proximal stenosis is identified. The anterior cerebral arteries are patent without high-grade proximal stenosis. No anterior circulation aneurysm is identified Posterior circulation: The non dominant intracranial right vertebral artery is developmentally diminutive beyond the origin of the right PICA, although patent. The dominant intracranial left vertebral artery is patent. Calcified plaque in the V4 left  vertebral artery is incompletely imaged but contributes to  moderate stenosis at the visualized levels. The basilar artery is patent without significant stenosis. The posterior cerebral arteries are patent bilaterally. Mild narrowing within the proximal P2 right PCA. There is a sizable left posterior communicating artery with hypoplastic left P1 segment. A right posterior communicating artery is not definitive identified. No posterior circulation aneurysm is identified. Venous sinuses: Within limitations of contrast timing, no convincing thrombus. Anatomic variants: As described IMPRESSION: CT head: 1. Unchanged prepontine subarachnoid hemorrhage and small volume hemorrhage layering within the lateral ventricles. No evidence of interval hemorrhage. No evidence of hydrocephalus on the current exam. 2. Additional chronic findings without interval change as described. CTA head: 1. No intracranial aneurysm is identified at the imaged levels. 2. Intracranial atherosclerotic disease with multifocal stenoses, most notably as follows. 3. Prominent calcified plaque within the intracranial internal carotid arteries bilaterally. Sites of mild/moderate stenosis within the right ICA. Narrowing of the intracranial left ICA is greatest within the left paraclinoid segment, at least moderate at this site. 4. Calcified plaque results in moderate stenosis of the dominant V4 left vertebral artery. Electronically Signed   By: Kellie Simmering DO   On: 09/14/2019 12:38   CT HEAD WO CONTRAST  Result Date: 09/15/2019 CLINICAL DATA:  Subarachnoid hemorrhage. EXAM: CT HEAD WITHOUT CONTRAST TECHNIQUE: Contiguous axial images were obtained from the base of the skull through the vertex without intravenous contrast. COMPARISON:  CT angiogram head 09/14/2019, head CT 09/14/2019 FINDINGS: Brain: Subarachnoid hemorrhage within the prepontine cistern, eccentric to the left, has not significantly changed in extent as compared to prior examination 09/14/2019. This again measures up to 15 mm in greatest  thickness. Similar appearance of mild mass effect upon the left anterolateral pons. Again demonstrated is small volume hemorrhage layering dependently within the lateral ventricles. Hemorrhage within the right lateral ventricle as minimally increased, likely due to redistribution. The ventricles are unchanged in size and configuration without evidence of hydrocephalus. No acute demarcated cortical infarct. Redemonstrated chronic encephalomalacia within the anterolateral right temporal lobe, which may be posttraumatic or post-ischemic in etiology. Redemonstrated chronic infarcts within the bilateral basal ganglia and left cerebellum. Ill-defined hypoattenuation within the cerebral white matter is nonspecific, but consistent with chronic small vessel ischemic disease. Stable, mild generalized parenchymal atrophy. Vascular: No hyperdense vessel.  Atherosclerotic calcifications. Skull: Normal. Negative for fracture or focal lesion. Sinuses/Orbits: Visualized orbits demonstrate no acute abnormality. No significant paranasal sinus disease or mastoid effusion at the imaged levels. IMPRESSION: 1. Prepontine subarachnoid hemorrhage has not significantly changed since prior exam 09/14/2019. Mild mass effect upon the pons. Redemonstrated small volume hemorrhage layering within the lateral ventricles. Hemorrhage within the right ventricle has minimally increased, likely due to redistribution. No evidence of hydrocephalus. 2. Additional chronic findings without interval change as described. Electronically Signed   By: Kellie Simmering DO   On: 09/15/2019 11:06   CT HEAD WO CONTRAST  Result Date: 09/14/2019 CLINICAL DATA:  Fall at dialysis with headache EXAM: CT HEAD WITHOUT CONTRAST CT CERVICAL SPINE WITHOUT CONTRAST TECHNIQUE: Multidetector CT imaging of the head and cervical spine was performed following the standard protocol without intravenous contrast. Multiplanar CT image reconstructions of the cervical spine were also  generated. COMPARISON:  None. FINDINGS: CT HEAD FINDINGS Brain: Left para median prepontine subarachnoid hemorrhage measuring 11 mm thickness. There is a reported left 6 nerve palsy which correlates with this location. Intraventricular hemorrhage is seen layering in the lateral ventricles, likely intraventricular reflux. There is history of  syncope and fall. Encephalomalacia in the inferior right temporal lobe which could be posttraumatic or ischemic. There have been remote small vessel infarcts at the basal ganglia. No hydrocephalus when accounting for atrophy. Vascular: No hyperdense vessel or unexpected calcification. Skull: Normal. Negative for fracture or focal lesion. Sinuses/Orbits: Negative Critical Value/emergent results were called by telephone at the time of interpretation on 09/14/2019 at 10:26 am to provider [facility] , who verbally acknowledged these results. CT CERVICAL SPINE FINDINGS Alignment: No traumatic malalignment Skull base and vertebrae: Negative for acute fracture Soft tissues and spinal canal: No prevertebral fluid or swelling. No visible canal hematoma. Disc levels: Lower cervical disc and upper cervical facet degeneration. Upper chest: Negative IMPRESSION: 1. Subarachnoid hemorrhage in the prepontine cistern that is left eccentric and fairly focal/thick. Reportedly there was preceding syncope and fall. 2. Small volume hemorrhage layering in the lateral ventricles. 3. Negative for cervical spine fracture. 4. Remote small vessel infarcts. Electronically Signed   By: Monte Fantasia M.D.   On: 09/14/2019 10:29   CT CERVICAL SPINE WO CONTRAST  Result Date: 09/14/2019 CLINICAL DATA:  Fall at dialysis with headache EXAM: CT HEAD WITHOUT CONTRAST CT CERVICAL SPINE WITHOUT CONTRAST TECHNIQUE: Multidetector CT imaging of the head and cervical spine was performed following the standard protocol without intravenous contrast. Multiplanar CT image reconstructions of the cervical spine were also  generated. COMPARISON:  None. FINDINGS: CT HEAD FINDINGS Brain: Left para median prepontine subarachnoid hemorrhage measuring 11 mm thickness. There is a reported left 6 nerve palsy which correlates with this location. Intraventricular hemorrhage is seen layering in the lateral ventricles, likely intraventricular reflux. There is history of syncope and fall. Encephalomalacia in the inferior right temporal lobe which could be posttraumatic or ischemic. There have been remote small vessel infarcts at the basal ganglia. No hydrocephalus when accounting for atrophy. Vascular: No hyperdense vessel or unexpected calcification. Skull: Normal. Negative for fracture or focal lesion. Sinuses/Orbits: Negative Critical Value/emergent results were called by telephone at the time of interpretation on 09/14/2019 at 10:26 am to provider [facility] , who verbally acknowledged these results. CT CERVICAL SPINE FINDINGS Alignment: No traumatic malalignment Skull base and vertebrae: Negative for acute fracture Soft tissues and spinal canal: No prevertebral fluid or swelling. No visible canal hematoma. Disc levels: Lower cervical disc and upper cervical facet degeneration. Upper chest: Negative IMPRESSION: 1. Subarachnoid hemorrhage in the prepontine cistern that is left eccentric and fairly focal/thick. Reportedly there was preceding syncope and fall. 2. Small volume hemorrhage layering in the lateral ventricles. 3. Negative for cervical spine fracture. 4. Remote small vessel infarcts. Electronically Signed   By: Monte Fantasia M.D.   On: 09/14/2019 10:29   US Paracentesis  Result Date: 09/14/2019 INDICATION: Cirrhosis Recurrent ascites EXAM: ULTRASOUND GUIDED  PARACENTESIS MEDICATIONS: 10 cc 1% lidocaine COMPLICATIONS: None immediate. PROCEDURE: Informed written consent was obtained from the patient after a discussion of the risks, benefits and alternatives to treatment. A timeout was performed prior to the initiation of the  procedure. Initial ultrasound scanning demonstrates a large amount of ascites within the right lower abdominal quadrant. The right lower abdomen was prepped and draped in the usual sterile fashion. 1% lidocaine was used for local anesthesia. Following this, a 19 g Yueh catheter was introduced. An ultrasound image was saved for documentation purposes. The paracentesis was performed. The catheter was removed and a dressing was applied. The patient tolerated the procedure well without immediate post procedural complication. FINDINGS: A total of approximately 4.3 liters of  cloudy milky fluid was removed. Samples were sent to the laboratory as requested by the clinical team. IMPRESSION: Successful ultrasound-guided paracentesis yielding 4.3 liters of peritoneal fluid. Read by Lavonia Drafts [facility] Electronically Signed   By: Lavonia Dana M.D.   On: 09/14/2019 09:07    Assessment/Plan: Left prepontine, premedullary hemorrhage: I have again discussed this with the patient.  I have told him that this is hemorrhage.  The CTA did not demonstrate any concerning sources of potential rebleeding.  I recommend we proceed with a cerebral arteriogram to make double sure there is nothing that could bleed again.  I have answered all his questions.  He wants to proceed with the arteriogram as planned on Monday.  Multiple medical problems: I appreciate Dr. Venetia Constable Ortiz's care of this patient.  LOS: 2 days     Ophelia Charter 09/16/2019, 7:33 AM

## 2019-09-16 NOTE — Progress Notes (Signed)
[facility] Progress Note  Name: [name] MRN: [id number] DOB: 03/07/1946  Chief Complaint:  Headache with [facility]  Subjective:  Infiltrated yesterday and pt refused dialysis today.  Willing to come now for 2 hours.  Updated daughter at bedside. Found to be covid positive but has recent diagnosis of covid PNA (noted present on 1/2).  EDW is 77.5 kg but has been adjusted because just started paracentesis past two weeks - last post weight was 77.5 kg.  Review of systems:  Denies shortness of breath or chest pain Denies n/v  ------- Background on consult:  74 year old Caucasian man with past medical history significant for congestive heart failure, coronary artery disease status post PCI, hypertension, myelodysplastic syndrome, type 2 diabetes mellitus and end-stage renal disease on hemodialysis on a Monday/Wednesday/Friday schedule at [place], [place].  Brought into the emergency room at [facility] with worsening headache/double vision and blurred vision while undergoing paracentesis for ascites related to his cirrhosis.  There are reports of preceding headache and worsening confusion/unsteadiness prior to the procedure as well as a syncopal event after hemodialysis on Monday 2/22.  A CT scan of the head showed subarachnoid hemorrhage with focal collection of hemorrhage in the lateral ventricles and no midline shift.  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  He missed his dialysis yesterday.  He denies any chest pain and reports some abdominal discomfort from distention.  He denies any shortness of breath and is oxygenating well on room air.  He denies any nausea, vomiting or diarrhea.  He wants to go home.  Daughter at his side feels that he has had recent mood changes and problems with memory.  No intake or output data in the 24 hours ending 09/16/19 1147  Vitals:  Vitals:    09/16/19 0100 09/16/19 0200 09/16/19 0433 09/16/19 0858  BP:   (!) 150/70 (!) 143/70  Pulse: 79 68 71 73  Resp: (!) 21 (!) 21 20 20   Temp:   98.5 F (36.9 C) (!) 97.4 F (36.3 C)  TempSrc:   Oral Oral  SpO2: 95% 93% 98% 98%  Weight:      Height:         Physical Exam:  General adult male in bed in no acute distress Neck supple trachea midline Lungs clear to auscultation bilaterally normal work of breathing at rest  Heart S1S2 no rub Abdomen soft nontender nondistended Extremities no edema  Psych - easily frustrated - improves with interview Access LUE AVF bruit and thrill    Medications reviewed    Labs:  BMP Latest Ref Rng & Units 09/14/2019 08/24/2019 07/26/2019  Glucose 70 - 99 mg/dL 139(H) 119(H) 136(H)  BUN 8 - 23 mg/dL 45(H) 25(H) 87(H)  Creatinine 0.61 - 1.24 mg/dL 4.19(H) 4.13(H) 7.19(H)  Sodium 135 - 145 mmol/L 137 143 131(L)  Potassium 3.5 - 5.1 mmol/L 4.0 4.5 4.7  Chloride 98 - 111 mmol/L 102 102 96(L)  CO2 22 - 32 mmol/L 25 30 20(L)  Calcium 8.9 - 10.3 mg/dL 8.4(L) 8.6(L) 7.6(L)   Outpatient HD orders:  revaclear 300 BF 300/ DF 600 Signs off after 4 hours but ordered for 4 hrs 15 minutes 2k/2.5 ca No heparin due to hx GI bleed They pull 1.1 kg/hr  EDW is 77.5 kg but has been adjusted because just started paracentesis past two weeks epo 1000 units every tx hectorol 1 mcg each  tx  Iv iron 50 mg weekly on mondays  Last post weight 77.5 on 2/22 Last tx there on 2/22  Assessment/Plan:   1.  Subarachnoid hemorrhage: With evidence of posterior fossa bleeding and left abducens nerve palsy currently wearing an eye patch to alleviate diplopia.  Awaiting cerebral angiogram Monday 3/1 per neurosurgical note.  2.  End-stage renal disease:  - Outpatient regimen HD MWF [facility]  - outpatient unit orders as above - Continue MWF schedule for HD - he has agreed to 2 hrs today - refused this AM.   3.  Hypertension:  - improved control on current regimen ; on  exam 132/73 on bedside monitor   4.  Anemia of chronic disease Including ESRD and myelodysplastic syndrome.   - on ESA as above outpatient  - hold for now; got iron this Monday at outpatient HD  - With [facility].  5.  Secondary hyperparathyroidism: Resume renal/low phosphorus diet and binders with sevelamer.  Start hectorol 1 mcg each tx.   6. Covid positive Found to be covid positive but has recent diagnosis of covid PNA (noted present on 07/23/19)   Claudia Desanctis, MD 09/16/2019 12:24 PM

## 2019-09-17 LAB — GLUCOSE, CAPILLARY
Glucose-Capillary: 113 mg/dL — ABNORMAL HIGH (ref 70–99)
Glucose-Capillary: 131 mg/dL — ABNORMAL HIGH (ref 70–99)
Glucose-Capillary: 165 mg/dL — ABNORMAL HIGH (ref 70–99)
Glucose-Capillary: 120 mg/dL — ABNORMAL HIGH (ref 70–99)

## 2019-09-17 LAB — CBC
HCT: 23.9 % — ABNORMAL LOW (ref 39.0–52.0)
Hemoglobin: 7.9 g/dL — ABNORMAL LOW (ref 13.0–17.0)
MCH: 31.1 pg (ref 26.0–34.0)
MCHC: 33.1 g/dL (ref 30.0–36.0)
MCV: 94.1 fL (ref 80.0–100.0)
Platelets: 58 10*3/uL — ABNORMAL LOW (ref 150–400)
RBC: 2.54 MIL/uL — ABNORMAL LOW (ref 4.22–5.81)
RDW: 21.2 % — ABNORMAL HIGH (ref 11.5–15.5)
WBC: 6.2 10*3/uL (ref 4.0–10.5)
nRBC: 1.8 % — ABNORMAL HIGH (ref 0.0–0.2)

## 2019-09-17 MED ORDER — ALUM & MAG HYDROXIDE-SIMETH 200-200-20 MG/5ML PO SUSP
30.0000 mL | Freq: Four times a day (QID) | ORAL | Status: DC | PRN
  Administered 2019-09-17 – 2019-09-18 (×2): 30 mL via ORAL
  Filled 2019-09-17 (×2): qty 30

## 2019-09-17 NOTE — Progress Notes (Signed)
RN arrived with another staff member Investment banker, corporate) to perform skin check, patient agreeing after education and reinforcment. Blanching redness found on coccyx, patient turned with pillow, refuses other interventions at this time.

## 2019-09-17 NOTE — Evaluation (Addendum)
Occupational Therapy Evaluation Patient Details Name: [name] MRN: [id number] DOB: [birth year] Today's Date: 09/17/2019    History of Present Illness 74 y.o. male admitted on 09/14/19 with HA, double vision.  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  Neurosurgery following, but repeat scans look stable.  s/p paracentesis day of admission 09/14/19.  Other working dx include thrombocytopenia.  Pt with significant PMH of COVID 19 PNA, liver cirrhosis, ESRD on HD M, W, Fr, CAD, myelodysplastic syndrome, DM2, CHF, lumbar disc surgery.    Clinical Impression    This 74 yo male admitted with above presents to acute OT with PLOF being totally independent with basic ADLs and driving himself to dialysis--however dtr Opal Sidles) reports pt has dementia and "sun downer's". Currently he has decreased sitting balance with tendency for posterior and/or left lateral lean; however up on his feet for short distances in the room he does well with balance with and without taped glasses (but felt more woozy without the tape). He is not a big fan of the tape or current patch in the room (dtr looking for another kind)--feel he may be more compliant with one of these than glasses.Due to his vision he is not safe to up by himself.Recommended that dtr get pt a shower seat to sit on at home for safety and for someone to with him any time he is up on his feet.  Follow Up Recommendations  Outpatient OT;Supervision/Assistance - 24 hour(someone with him everytime he is up on his feet)          Precautions / Restrictions Precautions Precautions: Fall Restrictions Weight Bearing Restrictions: No      Mobility Bed Mobility Overal bed mobility: Independent                Transfers Overall transfer level: Needs assistance Equipment used: None Transfers: Sit to/from Stand Sit to Stand: Min assist         General transfer comment: min guard A walking around in room no AD    Balance  Overall balance assessment: Needs assistance Sitting-balance support: No upper extremity supported;Feet supported Sitting balance-Leahy Scale: Poor Sitting balance - Comments: supervision EOB, pt leans left, can tell me he is leaning left, did self correct but not right away Postural control: Left lateral lean;Posterior lean Standing balance support: No upper extremity supported Standing balance-Leahy Scale: Fair                             ADL either performed or assessed with clinical judgement   ADL Overall ADL's : Needs assistance/impaired Eating/Feeding: Independent;Sitting   Grooming: Set up;Sitting   Upper Body Bathing: Set up;Sitting   Lower Body Bathing: Min guard;Sit to/from stand   Upper Body Dressing : Set up;Sitting   Lower Body Dressing: Min guard;Sit to/from stand   Toilet Transfer: Ambulation;Min Psychiatric nurse Details (indicate cue type and reason): No AD Toileting- Clothing Manipulation and Hygiene: Min guard;Sit to/from stand               Vision Baseline Vision/History: Wears glasses Wears Glasses: At all times(but intermittently)--would not let me try and tape these Patient Visual Report: Diplopia Vision Assessment?: Yes Eye Alignment: Within Functional Limits(at midline gaze) Ocular Range of Motion: Restricted on the left Alignment/Gaze Preference: Head turned(slightly to right) Tracking/Visual Pursuits: Left eye does not track laterally Additional Comments: Attempted to tape glasses but pt would tell me on one occassion  he was seeing one and then on another occassion two. He was also not happy with the "blurring" that the tape caused" so at suggestion of MD in room (black out taped area)--tried this and pt said it did help with blurring and was double vision was better (and pt still had some light coming around glasses). However pt said ,"now if you can me to wear them" (I asked that he at least try with therapy). Tried taping  Binasal as well due to the nerve palsy which should have worked better but he reports it is no better than in regards to double vision. Pt does not want to wear eye patch due to it "cuts into area under his eye"--dtr is going to look for another eye patch at store to see he will wear it. Due to new vision issues and pt's dementia and "sun downing" per dtr it will be more difficult to get him to wear the glasses than a patch if she can find one that feels comfortable to him. I did make dtr aware that if he does use patch it would be good for him to alternate eyes every hour.            Pertinent Vitals/Pain Pain Assessment: No/denies pain     Hand Dominance Right   Extremity/Trunk Assessment Upper Extremity Assessment Upper Extremity Assessment: Overall WFL for tasks assessed           Communication Communication Communication: No difficulties   Cognition Arousal/Alertness: Awake/alert Behavior During Therapy: Agitated("wants to move on with procedure on Monday and be done with being here") Overall Cognitive Status: Impaired/Different from baseline Area of Impairment: Safety/judgement                         Safety/Judgement: Decreased awareness of safety;Decreased awareness of deficits     General Comments: Pt is irritable, impulsive, wants to do it his way despite deficits.  Per chart this mood swing has been prior to his [facility] and family is concerned it is dementia. Not wanting me to use gait belt this session (in room he is currently fine without it as long as someone is with him when he is up on his feet). Not really open to using glasses taped--"blurry due to tape" (he did do better from a woozy/dizziness standpoint with them on v. not). He would not let me tape his glasses that his dtr brought in              [place] expects to be discharged to:: Private residence Living Arrangements: Spouse/significant other;Children Available Help at  Discharge: Family;Available 24 hours/day Type of Home: House Home Access: Stairs to enter;Ramped entrance Entrance Stairs-Number of Steps: 1 into house from ramp   Home Layout: One level     Bathroom Shower/Tub: Occupational psychologist: Standard     Home Equipment: Environmental consultant - 2 wheels;Cane - single point          Prior Functioning/Environment Level of Independence: Independent                 OT Problem List: Impaired balance (sitting and/or standing);Decreased safety awareness;Impaired vision/perception      OT Treatment/Interventions: Self-care/ADL training;DME and/or AE instruction;Patient/family education;Visual/perceptual remediation/compensation    OT Goals(Current goals can be found in the care plan section) Acute Rehab OT Goals Patient Stated Goal: to get procedure done Monday and go home OT Goal Formulation: With patient Time For Goal  Achievement: 10/01/19 Potential to Achieve Goals: Good  OT Frequency: Min 2X/week              AM-PAC OT "6 Clicks" Daily Activity     Outcome Measure Help from another person eating meals?: None Help from another person taking care of personal grooming?: A Little Help from another person toileting, which includes using toliet, bedpan, or urinal?: A Little Help from another person bathing (including washing, rinsing, drying)?: A Little Help from another person to put on and taking off regular upper body clothing?: A Little Help from another person to put on and taking off regular lower body clothing?: A Little 6 Click Score: 19   End of Session    Activity Tolerance: (limited by wooziness and dizziness) Patient left: in bed;with call bell/phone within reach;with bed alarm set;with family/visitor present  OT Visit Diagnosis: Other abnormalities of gait and mobility (R26.89);Other (comment)(double vision)                Time: 1031-1100 OT Time Calculation (min): 29 min Charges:  OT General Charges $OT Visit:  1 Visit OT Evaluation $OT Eval Moderate Complexity: 1 Mod OT Treatments $Self Care/Home Management : 8-22 mins  Tye [place] , OTR/L Acute Rehab Services Pager [phone] Office [phone]     09/17/2019, 3:24 PM

## 2019-09-17 NOTE — Progress Notes (Signed)
[facility] PROGRESS NOTE    Progress Note  [name]  BJS:[id number] DOB: 23-Jun-1946 DOA: 09/14/2019 PCP: [name], MD     Brief Narrative:   [name] is an 74 y.o. 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  Apparently had a brief syncopal episode after his dialysis on 09/12/2019.  ct of the head done on admission showed subarachnoid hemorrhage in the prepontine cistern.  The emergency room physician consulted neurosurgery who recommended transfer from [facility] to [facility].  Assessment/Plan:   Subarachnoid hemorrhage with small hemorrhage in the lateral ventricles with 6th nerve palsy: Repeat CT of the head on 09/15/2019 show prepontine subarachnoid hemorrhage has not changed since prior exam mild mass-effect upon pons.   Scheduled for angiogram 1 09/19/2019. Continue to hold anticoagulation therapy and antiplatelet therapy.  Liver cirrhosis with ascites status post paracentesis on 09/14/2019: On admission he had 4.3 L removed.  Continue fluid restrictions and diuretic therapy.  Thrombocytopenia: In the setting of cirrhosis and myelodysplastic syndrome.   CBC is pending today, he refused labs this morning.  End-stage renal disease on hemodialysis Monday Wednesdays and Fridays: He did get hemodialysis on 09/16/2019 but only for 2 hours, appreciate renal's assistance.  CAD with prior stent: Continue pravastatin and labetalol.  Myelodysplastic syndrome: Follow-up with his oncologist as an outpatient continue to follow CBC closely his platelet count is down.  Follow-up blood platelets and hemoglobin. [place] this morning 7.8 down from  9.0 continue to trend.  Diabetes mellitus type 2, controlled: Continue carb modified diet and sliding scale blood glucose is fairly controlled.  Recent COVID-19 pneumonia: He is currently asymptomatic no further isolation needed.  GERD: Continue PPI.  Essential hypertension: Continue current regimen his blood pressure is fairly controlled ranging 135/75-130 6/68.   DVT prophylaxis: scd's Family Communication:none Disposition Plan/Barrier to D/C: Unable to determine per neurosurgery  Code Status:     Code Status Orders  (From admission, onward)         Start     Ordered   09/14/19 1243  Full code  Continuous     09/14/19 1243        Code Status History    Date Active Date Inactive Code Status Order ID Comments User Context   07/23/2019 2222 07/26/2019 2156 Full Code [id number]  Truett Mainland, DO Inpatient   04/19/2019 2332 04/21/2019 2005 Full Code [id number]  Vianne Bulls, MD ED   09/15/2016 1159 09/16/2016 1706 Full Code [id number]  Eber Jones, MD Inpatient   08/12/2016 0927 08/14/2016 1649 Full Code [id number]  Jettie Booze, MD Inpatient   04/25/2016 1618 05/04/2016 1716 Full Code [id number]  Oswald Hillock, MD Inpatient   04/15/2016 1549 04/16/2016 2018 Full Code [id number]  Kathie Dike, MD Inpatient   Advance Care Planning Activity        IV Access:    Peripheral IV   Procedures and diagnostic studies:   No results found.   Medical Consultants:    None.  Anti-Infectives:   None  Subjective:    [name] relates still seeing double.  Objective:    Vitals:   09/16/19 2009 09/16/19 2347 09/17/19 0413 09/17/19 0803  BP: (!) 142/75  132/68 136/68 135/71  Pulse: 70 67 64 73  Resp: 15 17 19 16   Temp: 99.1 F (37.3 C) 98.5 F (36.9 C) 97.9 F (36.6 C) 98 F (36.7 C)  TempSrc: Oral Oral Oral Oral  SpO2: 100% 99% 100% 96%  Weight:      Height:       SpO2: 96 %   Intake/Output Summary (Last 24 hours) at 09/17/2019  1056 Last data filed at 09/16/2019 1544 Gross per 24 hour  Intake --  Output 1000 ml  Net -1000 ml   Filed Weights   09/15/19 1449 09/16/19 1333 09/16/19 1544  Weight: 77 kg 78 kg 77 kg    Exam: General exam: In no acute distress. Respiratory system: Good air movement and clear to auscultation. Cardiovascular system: S1 & S2 heard, RRR. No JVD. Gastrointestinal system: Abdomen is nondistended, soft and nontender.  Central nervous system: Alert and oriented.  VI nerve palsy.     Data Reviewed:    Labs: Basic Metabolic Panel: Recent Labs  Lab 09/14/19 0928 09/16/19 1024  NA 137 133*  K 4.0 4.5  CL 102 102  CO2 25 23  GLUCOSE 139* 138*  BUN 45* 49*  CREATININE 4.19* 5.31*  CALCIUM 8.4* 7.9*   GFR Estimated Creatinine Clearance: 13.3 mL/min (A) (by C-G formula based on SCr of 5.31 mg/dL (H)). Liver Function Tests: Recent Labs  Lab 09/14/19 0928  AST 37  ALT 34  ALKPHOS 173*  BILITOT 0.5  PROT 5.4*  ALBUMIN 2.7*   No results for input(s): LIPASE, AMYLASE in the last 168 hours. No results for input(s): AMMONIA in the last 168 hours. Coagulation profile Recent Labs  Lab 09/14/19 0928  INR 1.0   COVID-19 Labs  No results for input(s): DDIMER, FERRITIN, LDH, CRP in the last 72 hours.  Lab Results  Component Value Date   SARSCOV2NAA POSITIVE (A) 09/14/2019   [place] NEGATIVE 04/19/2019   [place] NEGATIVE 04/12/2019    CBC: Recent Labs  Lab 09/14/19 0928 09/16/19 1024  WBC 8.2 6.2  HGB 9.0* 7.8*  HCT 27.5* 23.6*  MCV 96.5 93.7  PLT 67* 60*   Cardiac Enzymes: No results for input(s): CKTOTAL, CKMB, CKMBINDEX, TROPONINI in the last 168 hours. BNP (last 3 results) No results for input(s): PROBNP in the last 8760 hours. CBG: Recent Labs  Lab 09/16/19 0929 09/16/19 1223 09/16/19 1717 09/16/19 2129 09/17/19 0628  GLUCAP 122* 133* 157* 125* 113*   D-Dimer: No results for input(s): DDIMER in the last 72 hours. Hgb A1c: No results  for input(s): HGBA1C in the last 72 hours. Lipid Profile: No results for input(s): CHOL, HDL, LDLCALC, TRIG, CHOLHDL, LDLDIRECT in the last 72 hours. Thyroid function studies: No results for input(s): TSH, T4TOTAL, T3FREE, THYROIDAB in the last 72 hours.  Invalid input(s): FREET3 Anemia work up: No results for input(s): VITAMINB12, FOLATE, FERRITIN, TIBC, IRON, RETICCTPCT in the last 72 hours. Sepsis Labs: Recent Labs  Lab 09/14/19 0928 09/16/19 1024  WBC 8.2 6.2   Microbiology Recent Results (from the past 240 hour(s))  Gram stain     Status: None   Collection Time: 09/09/19  9:08 AM   Specimen: Peritoneal Washings  Result Value Ref Range Status   Specimen Description PERITONEAL  Final   Special Requests NONE  Final   Gram Stain   Final    NO ORGANISMS SEEN WBC PRESENT, PREDOMINANTLY MONONUCLEAR CYTOSPIN SMEAR Performed at [facility], [address] [place], [place] [address]  Report Status 09/09/2019 FINAL  Final  Culture, body fluid-bottle     Status: None   Collection Time: 09/09/19  9:08 AM   Specimen: Peritoneal Washings  Result Value Ref Range Status   Specimen Description   Final    PERITONEAL Performed at [facility], [address] [place], [place] [address]    Special Requests   Final    BOTTLES DRAWN AEROBIC AND ANAEROBIC 10 CC Performed at [facility], [address], [place], [place] [address]    Culture   Final    NO GROWTH 5 DAYS Performed at [place] [facility], [place] [address] [place], [place] [address]    Report Status 09/14/2019 FINAL  Final  Culture, body fluid-bottle     Status: None (Preliminary result)   Collection Time: 09/14/19  8:24 AM   Specimen: Ascitic  Result Value Ref Range Status   Specimen Description ASCITIC  Final   Special Requests BOTTLES DRAWN AEROBIC AND ANAEROBIC 10CC  Final   Culture   Final    NO GROWTH 3 DAYS Performed at [facility], [address], [place], [place] [address]    Report Status PENDING   Incomplete  Gram stain     Status: None   Collection Time: 09/14/19  8:24 AM   Specimen: Ascitic  Result Value Ref Range Status   Specimen Description ASCITIC  Final   Special Requests NONE  Final   Gram Stain   Final    NO ORGANISMS SEEN WBC PRESENT, PREDOMINANTLY MONONUCLEAR CYTOSPIN SMEAR Performed at [facility], [address] [place], [place] [address]    Report Status 09/14/2019 FINAL  Final  Respiratory Panel by RT PCR (Flu A&B, Covid) - Nasopharyngeal Swab     Status: Abnormal   Collection Time: 09/14/19 11:35 AM   Specimen: Nasopharyngeal Swab  Result Value Ref Range Status   SARS Coronavirus 2 by RT PCR POSITIVE (A) NEGATIVE Final    Comment: RESULT CALLED TO, READ BACK BY AND VERIFIED WITH: KENDRICK J. AT 1415 ON [id number] BY THOMPSON S. (NOTE) SARS-CoV-2 target nucleic acids are DETECTED. SARS-CoV-2 RNA is generally detectable in upper respiratory specimens  during the acute phase of infection. Positive results are indicative of the presence of the identified virus, but do not rule out bacterial infection or co-infection with other pathogens not detected by the test. Clinical correlation with patient history and other diagnostic information is necessary to determine patient infection status. The expected result is Negative. Fact Sheet for Patients:  PinkCheek.be Fact Sheet for Healthcare Providers: GravelBags.it This test is not yet approved or cleared by the [place] FDA and  has been authorized for detection and/or diagnosis of SARS-CoV-2 by FDA under an Emergency Use Authorization (EUA).  This EUA will remain in effect (meaning this test can b e used) for the duration of  the COVID-19 declaration under Section 564(b)(1) of the Act, 21 U.S.C. section 360bbb-3(b)(1), unless the authorization is terminated or revoked sooner.    Influenza A by PCR NEGATIVE NEGATIVE Final   Influenza B by PCR NEGATIVE  NEGATIVE Final    Comment: (NOTE) The Xpert Xpress SARS-CoV-2/FLU/RSV assay is intended as an aid in  the diagnosis of influenza from Nasopharyngeal swab specimens and  should not be used as a sole basis for treatment. Nasal washings and  aspirates are unacceptable for Xpert Xpress SARS-CoV-2/FLU/RSV  testing. Fact Sheet for Patients: PinkCheek.be Fact Sheet for Healthcare Providers: GravelBags.it This test is not yet approved or cleared by  the [employer] and  has been authorized for detection and/or diagnosis of SARS-CoV-2 by  FDA under an Emergency Use Authorization (EUA). This EUA will remain  in effect (meaning this test can be used) for the duration of the  Covid-19 declaration under Section 564(b)(1) of the Act, 21  U.S.C. section 360bbb-3(b)(1), unless the authorization is  terminated or revoked. Performed at [facility], [address] [place], [place] [address]   MRSA PCR Screening     Status: Abnormal   Collection Time: 09/15/19  3:20 PM   Specimen: Nasal Mucosa; Nasopharyngeal  Result Value Ref Range Status   MRSA by PCR POSITIVE (A) NEGATIVE Final    Comment:        The GeneXpert MRSA Assay (FDA approved for NASAL specimens only), is one component of a comprehensive MRSA colonization surveillance program. It is not intended to diagnose MRSA infection nor to guide or monitor treatment for MRSA infections. RESULT CALLED TO, READ BACK BY AND VERIFIED WITH: Steffanie Dunn RN 16:30 09/15/19 ([email or web address]) Performed at [facility], [place] [address] [place], [place] [address]      Medications:   . Chlorhexidine Gluconate Cloth  6 each Topical Q0600  . [START ON 09/19/2019] doxercalciferol  1 mcg Oral Q M,W,F-HD  . DULoxetine  60 mg Oral Daily  . insulin aspart  0-5 Units Subcutaneous QHS  . insulin aspart  0-6 Units Subcutaneous TID WC  . labetalol  100 mg Oral BID  . mupirocin ointment   Nasal BID  .  pravastatin  40 mg Oral q1800  . ruxolitinib phosphate  5 mg Oral Daily  . sevelamer carbonate  800 mg Oral TID WC  . sodium chloride flush  3 mL Intravenous Q12H   Continuous Infusions: . sodium chloride        LOS: 3 days   Charlynne Cousins  [facility]  09/17/2019, 10:56 AM

## 2019-09-17 NOTE — Progress Notes (Signed)
[facility] Progress Note  Name: [name] MRN: [id number] DOB: 01/29/1946  Chief Complaint:  Headache with SAH  Subjective:  Had 2 hours HD yesterday with 1 kg UF after initially refusing tx earlier in the morning.  States eye is better.   Review of systems:  Denies shortness of breath or chest pain Denies n/v  ------- Background on consult:  74 year old Caucasian man with past medical history significant for congestive heart failure, coronary artery disease status post PCI, hypertension, myelodysplastic syndrome, type 2 diabetes mellitus and end-stage renal disease on hemodialysis on a Monday/Wednesday/Friday schedule at [place], [place].  Brought into the emergency room at [facility] with worsening headache/double vision and blurred vision while undergoing paracentesis for ascites related to his cirrhosis.  There are reports of preceding headache and worsening confusion/unsteadiness prior to the procedure as well as a syncopal event after hemodialysis on Monday 2/22.  A CT scan of the head showed subarachnoid hemorrhage with focal collection of hemorrhage in the lateral ventricles and no midline shift.  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  He missed his dialysis yesterday.  He denies any chest pain and reports some abdominal discomfort from distention.  He denies any shortness of breath and is oxygenating well on room air.  He denies any nausea, vomiting or diarrhea.  He wants to go home.  Daughter at his side feels that he has had recent mood changes and problems with memory.  No intake or output data in the 24 hours ending 09/17/19 1816  Vitals:  Vitals:   09/17/19 0413 09/17/19 0803 09/17/19 1115 09/17/19 1550  BP: 136/68 135/71 140/68 126/69  Pulse: 64 73 67 67  Resp: 19 16 16 16   Temp: 97.9 F (36.6 C) 98 F (36.7 C) 98.5 F (36.9 C) 99.1 F (37.3  C)  TempSrc: Oral Oral Oral Oral  SpO2: 100% 96% 100% 100%  Weight:      Height:         Physical Exam: General adult male in bed in no acute distress Neck supple trachea midline Lungs clear to auscultation bilaterally normal work of breathing at rest  Heart S1S2 no rub Abdomen soft nontender nondistended Extremities no edema  Psych  - calm on interview; no anxiety or agitation Access LUE AVF bruit and thrill    Medications reviewed    Labs:  BMP Latest Ref Rng & Units 09/16/2019 09/14/2019 08/24/2019  Glucose 70 - 99 mg/dL 138(H) 139(H) 119(H)  BUN 8 - 23 mg/dL 49(H) 45(H) 25(H)  Creatinine 0.61 - 1.24 mg/dL 5.31(H) 4.19(H) 4.13(H)  Sodium 135 - 145 mmol/L 133(L) 137 143  Potassium 3.5 - 5.1 mmol/L 4.5 4.0 4.5  Chloride 98 - 111 mmol/L 102 102 102  CO2 22 - 32 mmol/L 23 25 30   Calcium 8.9 - 10.3 mg/dL 7.9(L) 8.4(L) 8.6(L)   Outpatient HD orders:  revaclear 300 BF 300/ DF 600 Signs off after 4 hours but ordered for 4 hrs 15 minutes 2k/2.5 ca No heparin due to hx GI bleed They pull 1.1 kg/hr  EDW is 77.5 kg but has been adjusted because just started paracentesis past two weeks epo 1000 units every tx hectorol 1 mcg each tx  Iv iron 50 mg weekly on mondays  Last post weight 77.5 on 2/22 Last tx there on 2/22  Assessment/Plan:   1.  Subarachnoid hemorrhage: With evidence of posterior fossa  bleeding and left abducens nerve palsy.  Awaiting cerebral angiogram Monday 3/1 per neurosurgical note.  2.  End-stage renal disease:  - Outpatient regimen HD MWF [facility]  - outpatient unit orders as above - Continue MWF schedule for HD  3.  Hypertension:  - controlled on current regimen  4.  Anemia of chronic disease Including ESRD and myelodysplastic syndrome.   - on ESA as above outpatient   - hold for now; got iron this Monday at outpatient HD  - With [facility].  5.  Secondary hyperparathyroidism: renal/low phosphorus diet and binders with sevelamer. hectorol 1 mcg each tx.    6. Covid positive Found to be covid positive but has recent diagnosis of covid PNA (noted present on 07/23/19)   Claudia Desanctis, MD 09/17/2019 6:24 PM

## 2019-09-17 NOTE — Plan of Care (Signed)
  Problem: Education: Goal: Knowledge of General Education information will improve Description: Including pain rating scale, medication(s)/side effects and non-pharmacologic comfort measures Outcome: Progressing  Patient verbalizes why he is taking [place].  Problem: Activity: Goal: Risk for activity intolerance will decrease Outcome: Progressing  Patient up to bathroom without c/o SOB.

## 2019-09-17 NOTE — Progress Notes (Signed)
Subjective: Patient reports Neurologically stable headaches double vision  Objective: Vital signs in last 24 hours: Temp:  [97.9 F (36.6 C)-99.1 F (37.3 C)] 98 F (36.7 C) (02/27 0803) Pulse Rate:  [60-73] 73 (02/27 0803) Resp:  [13-19] 16 (02/27 0803) BP: (117-147)/(61-75) 135/71 (02/27 0803) SpO2:  [96 %-100 %] 96 % (02/27 0803) Weight:  [77 kg-78 kg] 77 kg (02/26 1544)  Intake/Output from previous day: 02/26 0701 - 02/27 0700 In: -  Out: 1000  Intake/Output this shift: No intake/output data recorded.  Awake and alert left 6th nerve palsy otherwise neurologically intact  Lab Results: Recent Labs    09/14/19 0928 09/16/19 1024  WBC 8.2 6.2  HGB 9.0* 7.8*  HCT 27.5* 23.6*  PLT 67* 60*   BMET Recent Labs    09/14/19 0928 09/16/19 1024  NA 137 133*  K 4.0 4.5  CL 102 102  CO2 25 23  GLUCOSE 139* 138*  BUN 45* 49*  CREATININE 4.19* 5.31*  CALCIUM 8.4* 7.9*    Studies/Results: No results found.  Assessment/Plan: Continue observation mobilize with physical Occupational Therapy awaiting arteriogram on Monday  LOS: 3 days     Danelle Curiale P 09/17/2019, 9:19 AM

## 2019-09-18 LAB — BASIC METABOLIC PANEL
Anion gap: 9 (ref 5–15)
BUN: 40 mg/dL — ABNORMAL HIGH (ref 8–23)
CO2: 25 mmol/L (ref 22–32)
Calcium: 8 mg/dL — ABNORMAL LOW (ref 8.9–10.3)
Chloride: 98 mmol/L (ref 98–111)
Creatinine, Ser: 5.17 mg/dL — ABNORMAL HIGH (ref 0.61–1.24)
GFR calc Af Amer: 12 mL/min — ABNORMAL LOW (ref >60)
GFR calc non Af Amer: 10 mL/min — ABNORMAL LOW (ref >60)
Glucose, Bld: 103 mg/dL — ABNORMAL HIGH (ref 70–99)
Potassium: 4.6 mmol/L (ref 3.5–5.1)
Sodium: 132 mmol/L — ABNORMAL LOW (ref 135–145)

## 2019-09-18 LAB — CBC
HCT: 22.6 % — ABNORMAL LOW (ref 39.0–52.0)
Hemoglobin: 7.5 g/dL — ABNORMAL LOW (ref 13.0–17.0)
MCH: 30.9 pg (ref 26.0–34.0)
MCHC: 33.2 g/dL (ref 30.0–36.0)
MCV: 93 fL (ref 80.0–100.0)
Platelets: 57 10*3/uL — ABNORMAL LOW (ref 150–400)
RBC: 2.43 MIL/uL — ABNORMAL LOW (ref 4.22–5.81)
RDW: 21.5 % — ABNORMAL HIGH (ref 11.5–15.5)
WBC: 6.4 10*3/uL (ref 4.0–10.5)
nRBC: 3.7 % — ABNORMAL HIGH (ref 0.0–0.2)

## 2019-09-18 LAB — GLUCOSE, CAPILLARY
Glucose-Capillary: 101 mg/dL — ABNORMAL HIGH (ref 70–99)
Glucose-Capillary: 102 mg/dL — ABNORMAL HIGH (ref 70–99)
Glucose-Capillary: 115 mg/dL — ABNORMAL HIGH (ref 70–99)
Glucose-Capillary: 128 mg/dL — ABNORMAL HIGH (ref 70–99)

## 2019-09-18 MED ORDER — CHLORHEXIDINE GLUCONATE CLOTH 2 % EX PADS: 6.0000 | MEDICATED_PAD | Freq: Every day | CUTANEOUS | Status: DC

## 2019-09-18 NOTE — Telephone Encounter (Signed)
REVIEWED-NO ADDITIONAL RECOMMENDATIONS. 

## 2019-09-18 NOTE — Progress Notes (Signed)
[facility] Progress Note  Name: [name] MRN: [id number] DOB: July 05, 1946  Chief Complaint:  Headache with SAH  Subjective:  Had 2 hours HD yesterday with 1 kg UF (he had refused a full treatment but consented to 2 hours).  States he is having a procedure tomorrow (catheter angio per charting - appreciate plans for no IV fluids given ESRD) and that he's "not that good" and doesn't want to do dialysis after having to do the other procedure.  Advised to continue with his chronic regimen for his dialysis.    Review of systems:  Denies shortness of breath or chest pain  Denies n/v  ------- Background on consult:  74 year old Caucasian man with past medical history significant for congestive heart failure, coronary artery disease status post PCI, hypertension, myelodysplastic syndrome, type 2 diabetes mellitus and end-stage renal disease on hemodialysis on a Monday/Wednesday/Friday schedule at [place], [place].  Brought into the emergency room at [facility] with worsening headache/double vision and blurred vision while undergoing paracentesis for ascites related to his cirrhosis.  There are reports of preceding headache and worsening confusion/unsteadiness prior to the procedure as well as a syncopal event after hemodialysis on Monday 2/22.  A CT scan of the head showed subarachnoid hemorrhage with focal collection of hemorrhage in the lateral ventricles and no midline shift.  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  He missed his dialysis yesterday.  He denies any chest pain and reports some abdominal discomfort from distention.  He denies any shortness of breath and is oxygenating well on room air.  He denies any nausea, vomiting or diarrhea.  He wants to go home.  Daughter at his side feels that he has had recent mood changes and problems with memory.   Intake/Output  Summary (Last 24 hours) at 09/18/2019 1037 Last data filed at 09/18/2019 0858 Gross per 24 hour  Intake 240 ml  Output --  Net 240 ml    Vitals:  Vitals:   09/17/19 2027 09/17/19 2314 09/18/19 0442 09/18/19 0857  BP: 139/70 (!) 129/97 124/69 134/71  Pulse: 70 69 73 74  Resp: 16 18 18 16   Temp: 97.8 F (36.6 C) 97.9 F (36.6 C) 98.3 F (36.8 C) 97.7 F (36.5 C)  TempSrc: Oral Oral Oral Oral  SpO2: 98% 97% 99% 100%  Weight:      Height:         Physical Exam:  General adult male in bed in no acute distress Neck supple trachea midline Lungs clear to auscultation bilaterally normal work of breathing at rest  Heart S1S2 no rub Abdomen soft nontender nondistended Extremities no edema  Psych  - easily frustrated Access LUE AVF bruit and thrill    Medications reviewed    Labs:  BMP Latest Ref Rng & Units 09/18/2019 09/16/2019 09/14/2019  Glucose 70 - 99 mg/dL 103(H) 138(H) 139(H)  BUN 8 - 23 mg/dL 40(H) 49(H) 45(H)  Creatinine 0.61 - 1.24 mg/dL 5.17(H) 5.31(H) 4.19(H)  Sodium 135 - 145 mmol/L 132(L) 133(L) 137  Potassium 3.5 - 5.1 mmol/L 4.6 4.5 4.0  Chloride 98 - 111 mmol/L 98 102 102  CO2 22 - 32 mmol/L 25 23 25   Calcium 8.9 - 10.3 mg/dL 8.0(L) 7.9(L) 8.4(L)   Outpatient HD orders:  revaclear 300 BF 300/ DF 600 Signs off after 4 hours but ordered for 4 hrs 15 minutes 2k/2.5 ca No heparin  due to hx GI bleed They pull 1.1 kg/hr  EDW is 77.5 kg but has been adjusted because just started paracentesis past two weeks epo 1000 units every tx hectorol 1 mcg each tx  Iv iron 50 mg weekly on mondays  Last post weight 77.5 on 2/22 Last tx there on 2/22  Assessment/Plan:   1.  Subarachnoid hemorrhage: With evidence of posterior fossa bleeding and left abducens nerve palsy.  Awaiting cerebral angiogram Monday 3/1 per neurosurgical note.  2.  End-stage renal disease:  - Outpatient regimen HD MWF [facility]  - outpatient unit orders as above - Continue MWF schedule for HD  - plan for HD tomorrow per his usual regimen and I suspect will refuse  3.  Hypertension:  - controlled on current regimen  4.  Anemia of chronic disease Including ESRD and myelodysplastic syndrome.  infiltration with initial attempted tx last week here - on ESA as above outpatient   - got iron this Monday at outpatient HD  - now with [facility].   5.  Secondary hyperparathyroidism: renal/low phosphorus diet and sevelamer. hectorol 1 mcg each tx.   6. Covid positive Found to be covid positive but has recent diagnosis of covid PNA (noted present on 07/23/19). Out of isolation window  Claudia Desanctis, MD 09/18/2019 10:57 AM

## 2019-09-18 NOTE — Progress Notes (Signed)
[facility] PROGRESS NOTE    Progress Note  [name]  MPN:[id number] DOB: Jun 25, 1946 DOA: 09/14/2019 PCP: Alan Blitz, MD     Brief Narrative:   [name] is an 74 y.o. 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  Apparently had a brief syncopal episode after his dialysis on 09/12/2019.  ct of the head done on admission showed subarachnoid hemorrhage in the prepontine cistern.  The emergency room physician consulted neurosurgery who recommended transfer from [facility] to [facility].  Assessment/Plan:   Subarachnoid hemorrhage with small hemorrhage in the lateral ventricles with 6th nerve palsy: Repeat CT of the head on 09/15/2019 show prepontine subarachnoid hemorrhage has not changed since prior exam mild mass-effect upon pons.   Scheduled for angiogram 1 09/19/2019. Continue to hold anticoagulation therapy and antiplatelet therapy.  Liver cirrhosis with ascites status post paracentesis on 09/14/2019: On admission he had 4.3 L removed.  Continue fluid restrictions and diuretic therapy.  Thrombocytopenia: In the setting of cirrhosis and myelodysplastic syndrome.   CBC is pending today, he refused labs this morning.  End-stage renal disease on hemodialysis Monday Wednesdays and Fridays: He will go down for hemodialysis on Monday.  CAD with prior stent: Continue pravastatin and labetalol.  Myelodysplastic syndrome: Follow-up with his oncologist as an outpatient continue to follow CBC closely his platelet count is down.   Hemoglobin is trending down, as well his platelets his platelet count today is 57.  We will discuss with neurosurgery. We  will probably give a unit of blood with hemodialysis tomorrow.  Diabetes mellitus type 2, controlled: Continue carb modified diet and sliding scale blood glucose is fairly controlled.  Recent COVID-19 pneumonia: He is currently asymptomatic no further isolation needed.  GERD: Continue PPI.  Essential hypertension: Continue current regimen his blood pressure is fairly controlled ranging blood pressure has been ranging from 124/69-130 4/71.   DVT prophylaxis: scd's Family Communication:none Disposition Plan/Barrier to D/C: Unable to determine per neurosurgery  Code Status:     Code Status Orders  (From admission, onward)         Start     Ordered   09/14/19 1243  Full code  Continuous     09/14/19 1243        Code Status History    Date Active Date Inactive Code Status Order ID Comments User Context   07/23/2019 2222 07/26/2019 2156 Full Code [id number]  Truett Mainland, DO Inpatient   04/19/2019 2332 04/21/2019 2005 Full Code [id number]  Vianne Bulls, MD ED   09/15/2016 1159 09/16/2016 1706 Full Code [id number]  Eber Jones, MD Inpatient   08/12/2016 0927 08/14/2016 1649 Full Code [id number]  Jettie Booze, MD Inpatient   04/25/2016 1618 05/04/2016 1716 Full Code [id number]  Oswald Hillock, MD Inpatient   04/15/2016 1549 04/16/2016 2018 Full Code [id number]  Kathie Dike, MD Inpatient   Advance Care Planning Activity        IV Access:    Peripheral IV   Procedures and diagnostic studies:   No results found.   Medical Consultants:    None.  Anti-Infectives:   None  Subjective:    [name] in a bad mood this morning continues  to see double.  Objective:    Vitals:   09/17/19 2027 09/17/19 2314 09/18/19 0442 09/18/19 0857  BP: 139/70 (!) 129/97 124/69 134/71  Pulse: 70 69 73 74  Resp: 16 18 18 16   Temp: 97.8 F (36.6 C) 97.9 F (36.6 C) 98.3 F (36.8 C) 97.7 F (36.5 C)  TempSrc: Oral Oral Oral Oral  SpO2: 98% 97% 99% 100%    Weight:      Height:       SpO2: 100 %   Intake/Output Summary (Last 24 hours) at 09/18/2019 0950 Last data filed at 09/18/2019 0858 Gross per 24 hour  Intake 240 ml  Output --  Net 240 ml   Filed Weights   09/15/19 1449 09/16/19 1333 09/16/19 1544  Weight: 77 kg 78 kg 77 kg    Exam: General exam: In no acute distress. Respiratory system: Good air movement and clear to auscultation. Cardiovascular system: S1 & S2 heard, RRR. No JVD. Gastrointestinal system: Abdomen is nondistended, soft and nontender.  Central nervous system: Alert and oriented.  VI nerve palsy Psychiatry: Judgement and insight appear normal. Mood & affect appropriate.  Data Reviewed:    Labs: Basic Metabolic Panel: Recent Labs  Lab 09/14/19 0928 09/14/19 0928 09/16/19 1024 09/18/19 0512  NA 137  --  133* 132*  K 4.0   < > 4.5 4.6  CL 102  --  102 98  CO2 25  --  23 25  GLUCOSE 139*  --  138* 103*  BUN 45*  --  49* 40*  CREATININE 4.19*  --  5.31* 5.17*  CALCIUM 8.4*  --  7.9* 8.0*   < > = values in this interval not displayed.   GFR Estimated Creatinine Clearance: 13.7 mL/min (A) (by C-G formula based on SCr of 5.17 mg/dL (H)). Liver Function Tests: Recent Labs  Lab 09/14/19 0928  AST 37  ALT 34  ALKPHOS 173*  BILITOT 0.5  PROT 5.4*  ALBUMIN 2.7*   No results for input(s): LIPASE, AMYLASE in the last 168 hours. No results for input(s): AMMONIA in the last 168 hours. Coagulation profile Recent Labs  Lab 09/14/19 0928  INR 1.0   COVID-19 Labs  No results for input(s): DDIMER, FERRITIN, LDH, CRP in the last 72 hours.  Lab Results  Component Value Date   SARSCOV2NAA POSITIVE (A) 09/14/2019   [place] NEGATIVE 04/19/2019   [place] NEGATIVE 04/12/2019    CBC: Recent Labs  Lab 09/14/19 0928 09/16/19 1024 09/17/19 1156 09/18/19 0512  WBC 8.2 6.2 6.2 6.4  HGB 9.0* 7.8* 7.9* 7.5*  HCT 27.5* 23.6* 23.9* 22.6*  MCV 96.5 93.7 94.1 93.0  PLT 67* 60* 58* 57*    Cardiac Enzymes: No results for input(s): CKTOTAL, CKMB, CKMBINDEX, TROPONINI in the last 168 hours. BNP (last 3 results) No results for input(s): PROBNP in the last 8760 hours. CBG: Recent Labs  Lab 09/17/19 0628 09/17/19 1116 09/17/19 1608 09/17/19 2031 09/18/19 0629  GLUCAP 113* 131* 165* 120* 101*   D-Dimer: No results for input(s): DDIMER in the last 72 hours. Hgb A1c: No results for input(s): HGBA1C in the last 72 hours. Lipid Profile: No results for input(s): CHOL, HDL, LDLCALC, TRIG, CHOLHDL, LDLDIRECT in the last 72 hours. Thyroid function studies: No results for input(s): TSH, T4TOTAL, T3FREE, THYROIDAB in the last 72 hours.  Invalid input(s): FREET3 Anemia work up: No results for input(s): VITAMINB12, FOLATE, FERRITIN, TIBC, IRON, RETICCTPCT in the last 72 hours. Sepsis Labs: Recent Labs  Lab  09/14/19 0928 09/16/19 1024 09/17/19 1156 09/18/19 0512  WBC 8.2 6.2 6.2 6.4   Microbiology Recent Results (from the past 240 hour(s))  Gram stain     Status: None   Collection Time: 09/09/19  9:08 AM   Specimen: Peritoneal Washings  Result Value Ref Range Status   Specimen Description PERITONEAL  Final   Special Requests NONE  Final   Gram Stain   Final    NO ORGANISMS SEEN WBC PRESENT, PREDOMINANTLY MONONUCLEAR CYTOSPIN SMEAR Performed at [facility], [address], [place], [place] [address]    Report Status 09/09/2019 FINAL  Final  Culture, body fluid-bottle     Status: None   Collection Time: 09/09/19  9:08 AM   Specimen: Peritoneal Washings  Result Value Ref Range Status   Specimen Description   Final    PERITONEAL Performed at [facility], [address], [place], [place] [address]    Special Requests   Final    BOTTLES DRAWN AEROBIC AND ANAEROBIC 10 CC Performed at [facility], [address] [place], [place] [address]    Culture   Final    NO GROWTH 5 DAYS Performed at [facility], [place] [address] [place], [place] [address]     Report Status 09/14/2019 FINAL  Final  Culture, body fluid-bottle     Status: None (Preliminary result)   Collection Time: 09/14/19  8:24 AM   Specimen: Ascitic  Result Value Ref Range Status   Specimen Description ASCITIC  Final   Special Requests BOTTLES DRAWN AEROBIC AND ANAEROBIC 10CC  Final   Culture   Final    NO GROWTH 3 DAYS Performed at [facility], [address] [place], [place] [address]    Report Status PENDING  Incomplete  Gram stain     Status: None   Collection Time: 09/14/19  8:24 AM   Specimen: Ascitic  Result Value Ref Range Status   Specimen Description ASCITIC  Final   Special Requests NONE  Final   Gram Stain   Final    NO ORGANISMS SEEN WBC PRESENT, PREDOMINANTLY MONONUCLEAR CYTOSPIN SMEAR Performed at [facility], [address] [place], [place] [address]    Report Status 09/14/2019 FINAL  Final  Respiratory Panel by RT PCR (Flu A&B, Covid) - Nasopharyngeal Swab     Status: Abnormal   Collection Time: 09/14/19 11:35 AM   Specimen: Nasopharyngeal Swab  Result Value Ref Range Status   SARS Coronavirus 2 by RT PCR POSITIVE (A) NEGATIVE Final    Comment: RESULT CALLED TO, READ BACK BY AND VERIFIED WITH: KENDRICK J. AT 1415 ON [id number] BY THOMPSON S. (NOTE) SARS-CoV-2 target nucleic acids are DETECTED. SARS-CoV-2 RNA is generally detectable in upper respiratory specimens  during the acute phase of infection. Positive results are indicative of the presence of the identified virus, but do not rule out bacterial infection or co-infection with other pathogens not detected by the test. Clinical correlation with patient history and other diagnostic information is necessary to determine patient infection status. The expected result is Negative. Fact Sheet for Patients:  PinkCheek.be Fact Sheet for Healthcare Providers: GravelBags.it This test is not yet approved or cleared by the [place] FDA and   has been authorized for detection and/or diagnosis of SARS-CoV-2 by FDA under an Emergency Use Authorization (EUA).  This EUA will remain in effect (meaning this test can b e used) for the duration of  the COVID-19 declaration under Section 564(b)(1) of the Act, 21 U.S.C.  section 360bbb-3(b)(1), unless the authorization is terminated or revoked sooner.    Influenza A by PCR NEGATIVE NEGATIVE Final   Influenza B by PCR NEGATIVE NEGATIVE Final    Comment: (NOTE) The Xpert Xpress SARS-CoV-2/FLU/RSV assay is intended as an aid in  the diagnosis of influenza from Nasopharyngeal swab specimens and  should not be used as a sole basis for treatment. Nasal washings and  aspirates are unacceptable for Xpert Xpress SARS-CoV-2/FLU/RSV  testing. Fact Sheet for Patients: PinkCheek.be Fact Sheet for Healthcare Providers: GravelBags.it This test is not yet approved or cleared by the [place] FDA and  has been authorized for detection and/or diagnosis of SARS-CoV-2 by  FDA under an Emergency Use Authorization (EUA). This EUA will remain  in effect (meaning this test can be used) for the duration of the  Covid-19 declaration under Section 564(b)(1) of the Act, 21  U.S.C. section 360bbb-3(b)(1), unless the authorization is  terminated or revoked. Performed at [facility], [address], [place], [place] [address]   MRSA PCR Screening     Status: Abnormal   Collection Time: 09/15/19  3:20 PM   Specimen: Nasal Mucosa; Nasopharyngeal  Result Value Ref Range Status   MRSA by PCR POSITIVE (A) NEGATIVE Final    Comment:        The GeneXpert MRSA Assay (FDA approved for NASAL specimens only), is one component of a comprehensive MRSA colonization surveillance program. It is not intended to diagnose MRSA infection nor to guide or monitor treatment for MRSA infections. RESULT CALLED TO, READ BACK BY AND VERIFIED WITH: Steffanie Dunn RN 16:30  09/15/19 ([email or web address]) Performed at [facility], [place] [address] [place], [place] [address]      Medications:   . Chlorhexidine Gluconate Cloth  6 each Topical Q0600  . [START ON 09/19/2019] doxercalciferol  1 mcg Oral Q M,W,F-HD  . DULoxetine  60 mg Oral Daily  . insulin aspart  0-5 Units Subcutaneous QHS  . insulin aspart  0-6 Units Subcutaneous TID WC  . labetalol  100 mg Oral BID  . mupirocin ointment   Nasal BID  . pravastatin  40 mg Oral q1800  . ruxolitinib phosphate  5 mg Oral Daily  . sevelamer carbonate  800 mg Oral TID WC  . sodium chloride flush  3 mL Intravenous Q12H   Continuous Infusions: . sodium chloride        LOS: 4 days   Charlynne Cousins  [facility]  09/18/2019, 9:50 AM

## 2019-09-18 NOTE — Progress Notes (Signed)
Patient removed eye patch stating it was "itchy" explained to patient the importance of maintaining eye patch. Patient and patients daughter at bedside voice understanding. Asked if we could try again this afternoon and patient agreed to try again.

## 2019-09-18 NOTE — Progress Notes (Signed)
Neurosurgery Service Progress Note  Subjective: No acute events overnight. Stable diplopia, mild headache, no nausea  Objective: Vitals:   09/17/19 2027 09/17/19 2314 09/18/19 0442 09/18/19 0857  BP: 139/70 (!) 129/97 124/69 134/71  Pulse: 70 69 73 74  Resp: 16 18 18 16   Temp: 97.8 F (36.6 C) 97.9 F (36.6 C) 98.3 F (36.8 C) 97.7 F (36.5 C)  TempSrc: Oral Oral Oral Oral  SpO2: 98% 97% 99% 100%  Weight:      Height:       Temp (24hrs), Avg:98.2 F (36.8 C), Min:97.7 F (36.5 C), Max:99.1 F (37.3 C)  CBC Latest Ref Rng & Units 09/18/2019 09/17/2019 09/16/2019  WBC 4.0 - 10.5 K/uL 6.4 6.2 6.2  Hemoglobin 13.0 - 17.0 g/dL 7.5(L) 7.9(L) 7.8(L)  Hematocrit 39.0 - 52.0 % 22.6(L) 23.9(L) 23.6(L)  Platelets 150 - 400 K/uL 57(L) 58(L) 60(L)   BMP Latest Ref Rng & Units 09/18/2019 09/16/2019 09/14/2019  Glucose 70 - 99 mg/dL 103(H) 138(H) 139(H)  BUN 8 - 23 mg/dL 40(H) 49(H) 45(H)  Creatinine 0.61 - 1.24 mg/dL 5.17(H) 5.31(H) 4.19(H)  Sodium 135 - 145 mmol/L 132(L) 133(L) 137  Potassium 3.5 - 5.1 mmol/L 4.6 4.5 4.0  Chloride 98 - 111 mmol/L 98 102 102  CO2 22 - 32 mmol/L 25 23 25   Calcium 8.9 - 10.3 mg/dL 8.0(L) 7.9(L) 8.4(L)    Intake/Output Summary (Last 24 hours) at 09/18/2019 0957 Last data filed at 09/18/2019 0858 Gross per 24 hour  Intake 240 ml  Output --  Net 240 ml    Current Facility-Administered Medications:  .  0.9 %  sodium chloride infusion, 250 mL, Intravenous, PRN, Manuella Ghazi, Pratik D, DO .  acetaminophen (TYLENOL) tablet 650 mg, 650 mg, Oral, Q6H PRN, 650 mg at 09/18/19 2536 **OR** acetaminophen (TYLENOL) suppository 650 mg, 650 mg, Rectal, Q6H PRN, Manuella Ghazi, Pratik D, DO .  alum & mag hydroxide-simeth (MAALOX/MYLANTA) 200-200-20 MG/5ML suspension 30 mL, 30 mL, Oral, Q6H PRN, Bodenheimer, Charles A, NP, 30 mL at 09/17/19 2322 .  Chlorhexidine Gluconate Cloth 2 % PADS 6 each, 6 each, Topical, Q0600, Elmarie Shiley, MD, 6 each at 09/17/19 0631 .  [START ON 09/19/2019]  doxercalciferol (HECTOROL) capsule 1 mcg, 1 mcg, Oral, Q M,W,F-HD, Harrie Jeans C, MD .  DULoxetine (CYMBALTA) DR capsule 60 mg, 60 mg, Oral, Daily, Manuella Ghazi, Pratik D, DO, 60 mg at 09/18/19 0915 .  fentaNYL (SUBLIMAZE) injection 12.5 mcg, 12.5 mcg, Intravenous, Q1H PRN, Heath Lark D, DO, 12.5 mcg at 09/14/19 1817 .  insulin aspart (novoLOG) injection 0-5 Units, 0-5 Units, Subcutaneous, QHS, Shah, Pratik D, DO .  insulin aspart (novoLOG) injection 0-6 Units, 0-6 Units, Subcutaneous, TID WC, Shah, Pratik D, DO, 1 Units at 09/17/19 1705 .  labetalol (NORMODYNE) tablet 100 mg, 100 mg, Oral, BID, Manuella Ghazi, Pratik D, DO, 100 mg at 09/18/19 0916 .  mupirocin ointment (BACTROBAN) 2 %, , Nasal, BID, Charlynne Cousins, MD, Given at 09/18/19 [phone] .  ondansetron (ZOFRAN) tablet 4 mg, 4 mg, Oral, Q6H PRN **OR** ondansetron (ZOFRAN) injection 4 mg, 4 mg, Intravenous, Q6H PRN, Manuella Ghazi, Pratik D, DO, 4 mg at 09/14/19 1559 .  oxyCODONE-acetaminophen (PERCOCET/ROXICET) 5-325 MG per tablet 1 tablet, 1 tablet, Oral, Q6H PRN, 1 tablet at 09/17/19 1633 **AND** oxyCODONE (Oxy IR/ROXICODONE) immediate release tablet 5 mg, 5 mg, Oral, Q6H PRN, Charlynne Cousins, MD, 5 mg at 09/17/19 1633 .  oxymetazoline (AFRIN) 0.05 % nasal spray 1 spray, 1 spray, Each Nare, BID PRN, Bodenheimer,  Clenton Pare, NP, 1 spray at 09/17/19 [phone] .  pravastatin (PRAVACHOL) tablet 40 mg, 40 mg, Oral, q1800, Charlynne Cousins, MD, 40 mg at 09/17/19 1631 .  ruxolitinib phosphate (JAKAFI) tablet 5 mg, 5 mg, Oral, Daily, Manuella Ghazi, Pratik D, DO, 5 mg at 09/18/19 0948 .  sevelamer carbonate (RENVELA) tablet 800 mg, 800 mg, Oral, TID WC, Shah, Pratik D, DO, 800 mg at 09/18/19 0900 .  sodium chloride flush (NS) 0.9 % injection 3 mL, 3 mL, Intravenous, Q12H, Shah, Pratik D, DO, 3 mL at 09/18/19 0916 .  sodium chloride flush (NS) 0.9 % injection 3 mL, 3 mL, Intravenous, PRN, Manuella Ghazi, Pratik D, DO, 3 mL at 09/17/19 0848 .  traMADol (ULTRAM) tablet 50 mg, 50 mg, Oral,  Q6H PRN, Charlynne Cousins, MD, 50 mg at 09/18/19 0102 .  traZODone (DESYREL) tablet 50 mg, 50 mg, Oral, QHS PRN, Heath Lark D, DO, 50 mg at 09/17/19 2101   Physical Exam: AOx3, PERRL, EOMI except for L 6th, FS, Strength 5/5 x4, SILTx4  Assessment & Plan: 74 y.o. man w/ MMP including ESRD, fall without reported head trauma, CTH w/ basilar cistern SAH.  -catheter angio in AM - NPO after midnight, will hold off on IVF given ESRD, order placed for angio   Judith Part  09/18/19 9:57 AM

## 2019-09-18 NOTE — Progress Notes (Signed)
Eye patch placed to patients left eye. Patient states "ill leave it on as long as I can stand it"

## 2019-09-18 NOTE — Progress Notes (Signed)
Patient refused to have eye patch placed this evening.

## 2019-09-19 ENCOUNTER — Inpatient Hospital Stay (HOSPITAL_COMMUNITY): Payer: Medicare Other

## 2019-09-19 LAB — CBC
HCT: 23.5 % — ABNORMAL LOW (ref 39.0–52.0)
Hemoglobin: 7.8 g/dL — ABNORMAL LOW (ref 13.0–17.0)
MCH: 31.2 pg (ref 26.0–34.0)
MCHC: 33.2 g/dL (ref 30.0–36.0)
MCV: 94 fL (ref 80.0–100.0)
Platelets: 64 10*3/uL — ABNORMAL LOW (ref 150–400)
RBC: 2.5 MIL/uL — ABNORMAL LOW (ref 4.22–5.81)
RDW: 21.7 % — ABNORMAL HIGH (ref 11.5–15.5)
WBC: 6.7 10*3/uL (ref 4.0–10.5)
nRBC: 3.2 % — ABNORMAL HIGH (ref 0.0–0.2)

## 2019-09-19 LAB — GLUCOSE, CAPILLARY
Glucose-Capillary: 96 mg/dL (ref 70–99)
Glucose-Capillary: 95 mg/dL (ref 70–99)
Glucose-Capillary: 87 mg/dL (ref 70–99)
Glucose-Capillary: 96 mg/dL (ref 70–99)
Glucose-Capillary: 106 mg/dL — ABNORMAL HIGH (ref 70–99)

## 2019-09-19 LAB — BASIC METABOLIC PANEL
Anion gap: 10 (ref 5–15)
BUN: 45 mg/dL — ABNORMAL HIGH (ref 8–23)
CO2: 24 mmol/L (ref 22–32)
Calcium: 8.1 mg/dL — ABNORMAL LOW (ref 8.9–10.3)
Chloride: 97 mmol/L — ABNORMAL LOW (ref 98–111)
Creatinine, Ser: 5.25 mg/dL — ABNORMAL HIGH (ref 0.61–1.24)
GFR calc Af Amer: 12 mL/min — ABNORMAL LOW (ref >60)
GFR calc non Af Amer: 10 mL/min — ABNORMAL LOW (ref >60)
Glucose, Bld: 98 mg/dL (ref 70–99)
Potassium: 4.5 mmol/L (ref 3.5–5.1)
Sodium: 131 mmol/L — ABNORMAL LOW (ref 135–145)

## 2019-09-19 LAB — CULTURE, BODY FLUID W GRAM STAIN -BOTTLE: Culture: NO GROWTH

## 2019-09-19 MED ORDER — DOXERCALCIFEROL 4 MCG/2ML IV SOLN: 1.0000 ug | INTRAVENOUS | Status: DC

## 2019-09-19 MED ORDER — NEPRO/CARBSTEADY PO LIQD
237.0000 mL | Freq: Two times a day (BID) | ORAL | Status: DC
  Administered 2019-09-20: 237 mL via ORAL

## 2019-09-19 NOTE — Progress Notes (Signed)
[facility] PROGRESS NOTE    Progress Note  [name]  LKG:[id number] DOB: January 09, 1946 DOA: 09/14/2019 PCP: [name], MD     Brief Narrative:   [name] is an 74 y.o. 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  Apparently had a brief syncopal episode after his dialysis on 09/12/2019.  ct of the head done on admission showed subarachnoid hemorrhage in the prepontine cistern.  The emergency room physician consulted neurosurgery who recommended transfer from [facility] to [facility].  Assessment/Plan:   Subarachnoid hemorrhage with small hemorrhage in the lateral ventricles with 6th nerve palsy: Repeat CT of the head on 09/15/2019 show prepontine subarachnoid hemorrhage has not changed since prior exam mild mass-effect upon pons.   Scheduled for angiogram 1 09/19/2019.  Patient refused dialysis on Friday but he is agreeable to dialysis to proceed with angiogram no surgery is now thinking about getting an MRI without contrast. Continue to hold anticoagulation therapy and antiplatelet therapy.  Liver cirrhosis with ascites status post paracentesis on 09/14/2019: On admission he had 4.3 L removed.  Continue fluid restrictions and diuretic therapy.  Thrombocytopenia: In the setting of cirrhosis and myelodysplastic syndrome.   CBC is pending today, he refused labs this morning.  End-stage renal disease on hemodialysis Monday Wednesdays and Fridays: He will go down for hemodialysis on Monday.  CAD with prior stent: Continue pravastatin and labetalol.  Myelodysplastic syndrome: Follow-up with his oncologist as an outpatient continue to follow CBC  closely his platelet count is down.   Hemoglobin is trending down, as well his platelets his platelet count today is 57.  We will discuss with neurosurgery. We will probably give a unit of blood with hemodialysis tomorrow.  Diabetes mellitus type 2, controlled: Continue carb modified diet and sliding scale blood glucose is fairly controlled.  Recent COVID-19 pneumonia: He is currently asymptomatic no further isolation needed.  GERD: Continue PPI.  Essential hypertension: Continue current regimen his blood pressure is fairly controlled ranging blood pressure has been ranging from 124/69-130 4/71.   DVT prophylaxis: scd's Family Communication:none Disposition Plan/Barrier to D/C: Unable to determine per neurosurgery  Code Status:     Code Status Orders  (From admission, onward)         Start     Ordered   09/14/19 1243  Full code  Continuous     09/14/19 1243        Code Status History    Date Active Date Inactive Code Status Order ID Comments User Context   07/23/2019 2222 07/26/2019 2156 Full Code [id number]  Truett Mainland, DO Inpatient   04/19/2019 2332 04/21/2019 2005 Full Code [id number]  Vianne Bulls, MD ED   09/15/2016 1159 09/16/2016 1706 Full Code [id number]  Eber Jones, MD Inpatient   08/12/2016 0927 08/14/2016 1649 Full Code [id number]  Jettie Booze, MD Inpatient   04/25/2016 1618 05/04/2016 1716 Full Code [id number]  Oswald Hillock, MD Inpatient   04/15/2016 1549 04/16/2016 2018 Full Code [id number]  Kathie Dike, MD Inpatient   Advance Care Planning Activity        IV Access:    Peripheral IV   Procedures and diagnostic studies:   No results found.  Medical Consultants:    None.  Anti-Infectives:   None  Subjective:    [name] in a bad mood this morning continues to see double.  Objective:    Vitals:   09/18/19 1948 09/18/19 2334 09/19/19 0403 09/19/19 0757  BP: (!) 148/71 121/73 137/79 136/72  Pulse: 67 64 67  70  Resp: 16 17 19 18   Temp: 97.7 F (36.5 C) 98 F (36.7 C) 98.5 F (36.9 C) 97.8 F (36.6 C)  TempSrc: Oral Oral Oral Oral  SpO2: 99% 98% 97% 95%  Weight:      Height:       SpO2: 95 % O2 Flow Rate (L/min): 2 L/min  No intake or output data in the 24 hours ending 09/19/19 1118 Filed Weights   09/15/19 1449 09/16/19 1333 09/16/19 1544  Weight: 77 kg 78 kg 77 kg    Exam: General exam: In no acute distress. Respiratory system: Good air movement and clear to auscultation. Cardiovascular system: S1 & S2 heard, RRR. No JVD. Gastrointestinal system: Abdomen is nondistended, soft and nontender.  Central nervous system: Alert and oriented.  VI nerve palsy Psychiatry: Judgement and insight appear normal. Mood & affect appropriate.  Data Reviewed:    Labs: Basic Metabolic Panel: Recent Labs  Lab 09/14/19 0928 09/14/19 0928 09/16/19 1024 09/16/19 1024 09/18/19 0512 09/19/19 0504  NA 137  --  133*  --  132* 131*  K 4.0   < > 4.5   < > 4.6 4.5  CL 102  --  102  --  98 97*  CO2 25  --  23  --  25 24  GLUCOSE 139*  --  138*  --  103* 98  BUN 45*  --  49*  --  40* 45*  CREATININE 4.19*  --  5.31*  --  5.17* 5.25*  CALCIUM 8.4*  --  7.9*  --  8.0* 8.1*   < > = values in this interval not displayed.   GFR Estimated Creatinine Clearance: 13.4 mL/min (A) (by C-G formula based on SCr of 5.25 mg/dL (H)). Liver Function Tests: Recent Labs  Lab 09/14/19 0928  AST 37  ALT 34  ALKPHOS 173*  BILITOT 0.5  PROT 5.4*  ALBUMIN 2.7*   No results for input(s): LIPASE, AMYLASE in the last 168 hours. No results for input(s): AMMONIA in the last 168 hours. Coagulation profile Recent Labs  Lab 09/14/19 0928  INR 1.0   COVID-19 Labs  No results for input(s): DDIMER, FERRITIN, LDH, CRP in the last 72 hours.  Lab Results  Component Value Date   SARSCOV2NAA POSITIVE (A) 09/14/2019   [place] NEGATIVE 04/19/2019   [place] NEGATIVE 04/12/2019    CBC: Recent Labs    Lab 09/14/19 0928 09/16/19 1024 09/17/19 1156 09/18/19 0512 09/19/19 0504  WBC 8.2 6.2 6.2 6.4 6.7  HGB 9.0* 7.8* 7.9* 7.5* 7.8*  HCT 27.5* 23.6* 23.9* 22.6* 23.5*  MCV 96.5 93.7 94.1 93.0 94.0  PLT 67* 60* 58* 57* 64*   Cardiac Enzymes: No results for input(s): CKTOTAL, CKMB, CKMBINDEX, TROPONINI in the last 168 hours. BNP (last 3 results) No results for input(s): PROBNP in the last 8760 hours. CBG: Recent Labs  Lab 09/18/19 1201 09/18/19 1628 09/18/19 2108 09/19/19 0639 09/19/19 0803  GLUCAP 102* 115* 128* 96 95   D-Dimer: No results for input(s): DDIMER in the last 72 hours. Hgb A1c: No results for input(s): HGBA1C in the last 72 hours. Lipid Profile: No results for  input(s): CHOL, HDL, LDLCALC, TRIG, CHOLHDL, LDLDIRECT in the last 72 hours. Thyroid function studies: No results for input(s): TSH, T4TOTAL, T3FREE, THYROIDAB in the last 72 hours.  Invalid input(s): FREET3 Anemia work up: No results for input(s): VITAMINB12, FOLATE, FERRITIN, TIBC, IRON, RETICCTPCT in the last 72 hours. Sepsis Labs: Recent Labs  Lab 09/16/19 1024 09/17/19 1156 09/18/19 0512 09/19/19 0504  WBC 6.2 6.2 6.4 6.7   Microbiology Recent Results (from the past 240 hour(s))  Culture, body fluid-bottle     Status: None   Collection Time: 09/14/19  8:24 AM   Specimen: Ascitic  Result Value Ref Range Status   Specimen Description ASCITIC  Final   Special Requests BOTTLES DRAWN AEROBIC AND ANAEROBIC 10CC  Final   Culture   Final    NO GROWTH 5 DAYS Performed at [facility], [address], [place], [place] [address]    Report Status 09/19/2019 FINAL  Final  Gram stain     Status: None   Collection Time: 09/14/19  8:24 AM   Specimen: Ascitic  Result Value Ref Range Status   Specimen Description ASCITIC  Final   Special Requests NONE  Final   Gram Stain   Final    NO ORGANISMS SEEN WBC PRESENT, PREDOMINANTLY MONONUCLEAR CYTOSPIN SMEAR Performed at [facility], [address] [place], [place] [address]    Report Status 09/14/2019 FINAL  Final  Respiratory Panel by RT PCR (Flu A&B, Covid) - Nasopharyngeal Swab     Status: Abnormal   Collection Time: 09/14/19 11:35 AM   Specimen: Nasopharyngeal Swab  Result Value Ref Range Status   SARS Coronavirus 2 by RT PCR POSITIVE (A) NEGATIVE Final    Comment: RESULT CALLED TO, READ BACK BY AND VERIFIED WITH: KENDRICK J. AT 1415 ON [id number] BY THOMPSON S. (NOTE) SARS-CoV-2 target nucleic acids are DETECTED. SARS-CoV-2 RNA is generally detectable in upper respiratory specimens  during the acute phase of infection. Positive results are indicative of the presence of the identified virus, but do not rule out bacterial infection or co-infection with other pathogens not detected by the test. Clinical correlation with patient history and other diagnostic information is necessary to determine patient infection status. The expected result is Negative. Fact Sheet for Patients:  PinkCheek.be Fact Sheet for Healthcare Providers: GravelBags.it This test is not yet approved or cleared by the [place] FDA and  has been authorized for detection and/or diagnosis of SARS-CoV-2 by FDA under an Emergency Use Authorization (EUA).  This EUA will remain in effect (meaning this test can b e used) for the duration of  the COVID-19 declaration under Section 564(b)(1) of the Act, 21 U.S.C. section 360bbb-3(b)(1), unless the authorization is terminated or revoked sooner.    Influenza A by PCR NEGATIVE NEGATIVE Final   Influenza B by PCR NEGATIVE NEGATIVE Final    Comment: (NOTE) The Xpert Xpress SARS-CoV-2/FLU/RSV assay is intended as an aid in  the diagnosis of influenza from Nasopharyngeal swab specimens and  should not be used as a sole basis for treatment. Nasal washings and  aspirates are unacceptable for Xpert Xpress SARS-CoV-2/FLU/RSV  testing. Fact Sheet for  Patients: PinkCheek.be Fact Sheet for Healthcare Providers: GravelBags.it This test is not yet approved or cleared by the [place] FDA and  has been authorized for detection and/or diagnosis of SARS-CoV-2 by  FDA under an Emergency Use Authorization (EUA). This EUA will remain  in effect (meaning this test can be used) for the duration of the  Covid-19 declaration under Section 564(b)(1) of the Act, 21  U.S.C. section 360bbb-3(b)(1), unless the authorization is  terminated or revoked. Performed at [facility], [address] [place], [place] [address]   MRSA PCR Screening     Status: Abnormal   Collection Time: 09/15/19  3:20 PM   Specimen: Nasal Mucosa; Nasopharyngeal  Result Value Ref Range Status   MRSA by PCR POSITIVE (A) NEGATIVE Final    Comment:        The GeneXpert MRSA Assay (FDA approved for NASAL specimens only), is one component of a comprehensive MRSA colonization surveillance program. It is not intended to diagnose MRSA infection nor to guide or monitor treatment for MRSA infections. RESULT CALLED TO, READ BACK BY AND VERIFIED WITH: Steffanie Dunn RN 16:30 09/15/19 ([email or web address]) Performed at [facility], [place] [address] [place], [place] [address]      Medications:   . Chlorhexidine Gluconate Cloth  6 each Topical Q0600  . Chlorhexidine Gluconate Cloth  6 each Topical Q0600  . doxercalciferol  1 mcg Intravenous Q M,W,F-HD  . DULoxetine  60 mg Oral Daily  . insulin aspart  0-5 Units Subcutaneous QHS  . insulin aspart  0-6 Units Subcutaneous TID WC  . labetalol  100 mg Oral BID  . mupirocin ointment   Nasal BID  . pravastatin  40 mg Oral q1800  . ruxolitinib phosphate  5 mg Oral Daily  . sevelamer carbonate  800 mg Oral TID WC  . sodium chloride flush  3 mL Intravenous Q12H   Continuous Infusions: . sodium chloride        LOS: 5 days   Charlynne Cousins  [facility]  09/19/2019,  11:18 AM

## 2019-09-19 NOTE — Progress Notes (Addendum)
[facility] Progress Note   74 year old Caucasian man with CHF coronary artery disease status post PCI, hypertension, MDS, DM ESRD  on hemodialysis MWF at [facility].  Brought into the emergency room at [facility] with worsening headache/double vision and blurred vision while undergoing paracentesis for ascites related to his cirrhosis.  A CT scan of the head showed subarachnoid hemorrhage with focal collection of hemorrhage in the lateral ventricles and no midline shift. Clinically, he appears to have 6th cranial nerve palsy and he was seen yesterday by neurosurgery who recommend cerebral MRA and if neg d/c. Daughter at his side feels that he has had recent mood changes and problems with memory  Outpatient HD orders:  revaclear 300 BF 300/ DF 600 Signs off after 4 hours but ordered for 4 hrs 15 minutes 2k/2.5 ca No heparin due to hx GI bleed They pull 1.1 kg/hr  EDW is 77.5 kg but has been adjusted because just started paracentesis past two weeks epo 1000 units every tx hectorol 1 mcg each tx  Iv iron 50 mg weekly on mondays  Last post weight 77.5 on 2/22 Last tx there on 2/22   Assessment/ Plan:   1. Subarachnoid hemorrhage: With evidence of posterior fossa bleeding and left abducens nerve palsy. Awaiting cerebral MRA Monday 3/1 per neurosurgical note.  2. End-stage renal disease:  - Outpatient regimen HD MWF [facility]  - outpatient unit orders as above - Continue MWF schedule for HD - plan for HD tomorrow per his usual regimen; he's refusing (had to negotiate and will cut short to 2.5 hrs o/w he is refusing.  I was contacted again ~1110AM and pt refused to go with transport for dialysis. If he's able to be d/c from neurological standpoint today I would rec d/c so he can get hd at his usual outpt center.  3. Hypertension: - controlled on current regimen  4. Anemia of chronic diseaseIncluding ESRD and myelodysplastic syndrome. infiltration with  initial attempted tx last week here - on ESA as above outpatient   - got iron this Monday at outpatient HD  -now with [facility].   5. Secondary hyperparathyroidism:renal/low phosphorus diet and sevelamer. hectorol 1 mcg each tx.   6. Covid positive Found to be covid positive but has recent diagnosis of covid PNA (noted present on 07/23/19). Out of isolation window  Subjective:   Refused HD and very demanding; d/w daughter bedside / outside room as well. Negotiated with him and will have to shorten tx etc to appease him o/w he's refusing HD.   Objective:   BP 136/72 (BP Location: Right Arm)   Pulse 70   Temp 97.8 F (36.6 C) (Oral)   Resp 18   Ht 6' (1.829 m)   Wt 77 kg   SpO2 95%   BMI 23.02 kg/m  No intake or output data in the 24 hours ending 09/19/19 1034 Weight change:   Physical Exam: General adult male sitting on side of  bed w/ OT present Neck supple trachea midline Lungs clear to auscultation bilaterally normal work of breathing at rest  Heart S1S2 no rub Abdomen soft nontender nondistended Extremities no edema  Psych  - easily frustrated Access Lt Cimino AVF bruit and thrill   Imaging: No results found.  Labs: BMET Recent Labs  Lab 09/14/19 0928 09/16/19 1024 09/18/19 0512 09/19/19 0504  NA 137 133* 132* 131*  K 4.0 4.5 4.6 4.5  CL 102 102 98 97*  CO2 25 23 25  24  GLUCOSE 139* 138* 103* 98  BUN 45* 49* 40* 45*  CREATININE 4.19* 5.31* 5.17* 5.25*  CALCIUM 8.4* 7.9* 8.0* 8.1*   CBC Recent Labs  Lab 09/16/19 1024 09/17/19 1156 09/18/19 0512 09/19/19 0504  WBC 6.2 6.2 6.4 6.7  HGB 7.8* 7.9* 7.5* 7.8*  HCT 23.6* 23.9* 22.6* 23.5*  MCV 93.7 94.1 93.0 94.0  PLT 60* 58* 57* 64*    Medications:    . Chlorhexidine Gluconate Cloth  6 each Topical Q0600  . Chlorhexidine Gluconate Cloth  6 each Topical Q0600  . doxercalciferol  1 mcg Oral Q M,W,F-HD  . DULoxetine  60 mg Oral Daily  . insulin aspart  0-5 Units Subcutaneous QHS  . insulin aspart   0-6 Units Subcutaneous TID WC  . labetalol  100 mg Oral BID  . mupirocin ointment   Nasal BID  . pravastatin  40 mg Oral q1800  . ruxolitinib phosphate  5 mg Oral Daily  . sevelamer carbonate  800 mg Oral TID WC  . sodium chloride flush  3 mL Intravenous Q12H      Otelia Santee, MD 09/19/2019, 10:34 AM

## 2019-09-19 NOTE — Progress Notes (Signed)
Renal Navigator reviewed chart and notes multiple HD refusals. Renal Navigator spoke with OP HD clinic who states patient often misses HD and cuts treatments short. Staff also state that patient mentioned about a week ago that he didn't think he would be around much longer. Renal Navigator questions the possibility of need for PMT involvement and discussed with Dr. Augustin Coupe. Navigator wanted to meet with patient first.  Navigator met with patient and his daughter at bedside to introduce self and ask how he is doing on HD, validating that it can often be a difficult treatment for many patients and noting that it has been documented that patient has refused it in the hospital more than once. Patient's daughter quickly stated that her father agreed to a 2.5 hour treatment today and a 2.5 hour treatment tomorrow. Renal Navigator commented that if discharge has been determined for today and patient would rather go to his OP HD clinic tomorrow, Renal Navigator would be happy to evaluate if this would be a possibility. Navigator also wanted to assess whether there was a larger conversation regarding refusing HD all together needed to be had. Patient stated that he has no problem going to his clinic in [place], but that he does not like having HD in the unit in the hospital. Patient's daughter commented that she will be at home with him when he discharges in order to help him get to OP HD clinic. He was upset that his access was infiltrated on first two attempts and then feels he was not treated with respect. Navigator apologized that patient felt this way and asked that he let Navigator know if there is anything Navigator can do to make him feel more comfortable. Navigator feels it has been established that patient does not want to discuss [place] or meet with PMT, but that he is eager to be discharged and does not want to have any more HD in the hospital than is necessary. Renal Navigator is available to reschedule patient at his OP  HD clinic if medically safe to do so and if his clinic is able to accommodate. Navigator will continue to follow.   Alphonzo Cruise, [place] Renal Navigator [phone]

## 2019-09-19 NOTE — Progress Notes (Signed)
Transportation at pt. Bedside to pickup for HD tx. Transporter states pt. Is refusing to come Dr. Augustin Coupe telephoned and notified

## 2019-09-19 NOTE — Progress Notes (Signed)
Occupational Therapy Treatment Patient Details Name: [name] MRN: [id number] DOB: 10/21/1945 Today's Date: 09/19/2019    History of present illness 74 y.o. male admitted on 09/14/19 with HA, double vision.  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  Neurosurgery following, but repeat scans look stable.  s/p paracentesis day of admission 09/14/19.  Other working dx include thrombocytopenia.  Pt with significant PMH of COVID 19 PNA, liver cirrhosis, ESRD on HD M, W, Fr, CAD, myelodysplastic syndrome, DM2, CHF, lumbar disc surgery.    OT comments  Pt making gradual progress towards OT goals this session. Pt continues to c/o diplpia but declines wearing eye patch or glasses with opaque taping. Overall, pt requires min guard- MIN A for household distance functional mobility with RW. Pt reports double vision during mobility and during reading. Education provided to pt and daughter on compenstaory strategies to incorporate into ADL routine at home to compensate for visual deficits. Updated DC recs to [facility], will let OT/L know about change in POC. Will follow for OT needs.    Follow Up Recommendations  Supervision/Assistance - 24 hour;Home health OT    Equipment Recommendations       Recommendations for Other Services      Precautions / Restrictions Precautions Precautions: Fall Precaution Comments: diplopia Restrictions Weight Bearing Restrictions: No       Mobility Bed Mobility Overal bed mobility: Needs Assistance Bed Mobility: Supine to Sit     Supine to sit: Min assist;HOB elevated     General bed mobility comments: pt reaching for therapist to assist with elevating trunk; light MIN A to scoot hips to EOB with bed pad  Transfers Overall transfer level: Needs assistance Equipment used: Rolling walker (2 wheeled) Transfers: Sit to/from Stand Sit to Stand: Min assist;Min guard         General transfer comment: MIN A to power into stand during first  sit<>stand not responding to cued for hand placement; min guard for sit<>stand during second trial    Balance Overall balance assessment: Needs assistance Sitting-balance support: No upper extremity supported;Feet supported Sitting balance-Leahy Scale: Poor Sitting balance - Comments: sup EOB; pt noted to lean L and posteriorly often Postural control: Left lateral lean;Posterior lean Standing balance support: Bilateral upper extremity supported Standing balance-Leahy Scale: Poor Standing balance comment: requries BUE support                           ADL either performed or assessed with clinical judgement   ADL Overall ADL's : Needs assistance/impaired                         Toilet Transfer: Minimal assistance;Min guard;RW;Ambulation Toilet Transfer Details (indicate cue type and reason): simulated via functional mobility with RW and MIN A - min guard for balance and to navigate obstacles in environment.       Tub/Shower Transfer Details (indicate cue type and reason): education provided to pt and dtr about using shower seat at home and having someone with pt during showers Functional mobility during ADLs: Min guard;Minimal assistance;Rolling walker General ADL Comments: pt limited by visual deficits, self limiting behaviors, decreased activity tolerance and decreased ability to care for self. session focus on functional mobility with RW and education on compensatory strategies to manage diplopia at home as pt declines using eye patch or eye glasses to assist with diplopia.     Vision Baseline Vision/History:  Wears glasses Wears Glasses: At all times Patient Visual Report: Diplopia(pt declines education on using eye glasses with translucent taping or eye patch)     Perception     Praxis      Cognition Arousal/Alertness: Awake/alert Behavior During Therapy: Agitated Overall Cognitive Status: Impaired/Different from baseline Area of Impairment:  Safety/judgement                         Safety/Judgement: Decreased awareness of safety;Decreased awareness of deficits     General Comments: pt continues to be irritable during session declining any education on using eye patch/ glasses to assist with diplopia. Pt states he hates the gait belt but allows OTA to use reluctantly        Exercises     Shoulder Instructions       General Comments      Pertinent Vitals/ Pain       Pain Assessment: Faces Faces Pain Scale: Hurts little more Pain Location: head Pain Descriptors / Indicators: Aching;Constant Pain Intervention(s): Monitored during session  Home Living                                          Prior Functioning/Environment              Frequency           Progress Toward Goals  OT Goals(current goals can now be found in the care plan section)  Progress towards OT goals: Progressing toward goals  Acute Rehab OT Goals Patient Stated Goal: to get procedure done Monday and go home OT Goal Formulation: With patient Time For Goal Achievement: 10/01/19 Potential to Achieve Goals: Good  Plan Discharge plan needs to be updated    Co-evaluation                 AM-PAC OT "6 Clicks" Daily Activity     Outcome Measure   Help from another person eating meals?: None Help from another person taking care of personal grooming?: A Little Help from another person toileting, which includes using toliet, bedpan, or urinal?: A Little Help from another person bathing (including washing, rinsing, drying)?: A Little Help from another person to put on and taking off regular upper body clothing?: A Little Help from another person to put on and taking off regular lower body clothing?: A Little 6 Click Score: 19    End of Session Equipment Utilized During Treatment: Gait belt;Rolling walker  OT Visit Diagnosis: Other abnormalities of gait and mobility (R26.89);Other (comment)    Activity Tolerance Treatment limited secondary to agitation   Patient Left in chair;with call bell/phone within reach;with family/visitor present   Nurse Communication Mobility status        Time: 0354-6568 OT Time Calculation (min): 22 min  Charges: OT General Charges $OT Visit: 1 Visit OT Treatments $Self Care/Home Management : 8-22 mins  Lanier Clam., COTA/L Acute Rehabilitation Services [phone]    Ihor Gully 09/19/2019, 1:05 PM

## 2019-09-19 NOTE — Progress Notes (Signed)
Initial Nutrition Assessment  **RD working remotely**  DOCUMENTATION CODES:   Not applicable  INTERVENTION:   Nepro Shake po BID, each supplement provides 425 kcal and 19 grams protein  Snacks TID   NUTRITION DIAGNOSIS:   Increased nutrient needs related to wound healing, chronic illness(ESRD on HD) as evidenced by estimated needs.   GOAL:   Patient will meet greater than or equal to 90% of their needs    MONITOR:   PO intake, Supplement acceptance, Skin, Weight trends, Labs, I & O's  REASON FOR ASSESSMENT:   Malnutrition Screening Tool    ASSESSMENT:   Pt with a PMH significant for ESRD on HD MWF, HTN, CAD prior stent myelodysplastic syndrome, liver cirrhosis with ascites, GERD, and DM2 went for paracentesis for his ascites the morning of admission and started having headache, accompanied by double vision and blurry vision. Pt admitted with SAH.  Pt providing very short responses to RD questions. He states he does not always eat 3 meals, but tries to on most days; unable to obtain more specific information regarding what he eats at this time. Pt states he eats similarly on HD and non-HD days. Pt reports good appetite currently and is eager for his next meal.   PO intake: 100% x 1 recorded meal  Per nephrology, pt's EDW is 77.5kg but this has been adjusted due to the pt starting paracentesis over the past 2 weeks.   Last HD: 09/16/19 Net UF: 1047ml   Pt is noted to have refused dialysis while admitted.  Medications reviewed and include: SSI, Hectorol, Jakafi, Renvela  Labs reviewed: Na 131 (L), BUN/Cr 45/5.25 (H)  NUTRITION - FOCUSED PHYSICAL EXAM:  RD unable to perform at this time.    Diet Order:   Diet Order            Diet renal with fluid restriction Fluid restriction: 1200 mL Fluid; Room service appropriate? Yes; Fluid consistency: Thin  Diet effective now              EDUCATION NEEDS:   No education needs have been identified at this  time  Skin:  Skin Assessment: Skin Integrity Issues: Skin Integrity Issues:: Other (Comment) Other: unstaged pressure injury to coccyx  Last BM:  2/28  Height:   Ht Readings from Last 1 Encounters:  09/14/19 6' (1.829 m)    Weight:   Wt Readings from Last 1 Encounters:  09/16/19 77 kg    BMI:  Body mass index is 23.02 kg/m.  Estimated Nutritional Needs:   Kcal:  2000-2200  Protein:  125-135 grams  Fluid:  1051ml + UOP   Larkin Ina, MS, RD, LDN RD pager number and weekend/on-call pager number located in [place].

## 2019-09-19 NOTE — Plan of Care (Signed)
  Problem: Safety: Goal: Ability to remain free from injury will improve Outcome: Progressing   

## 2019-09-19 NOTE — Progress Notes (Signed)
Patient is refusing dialysis.  Educated and asked three times to have dialysis.

## 2019-09-19 NOTE — Progress Notes (Signed)
The chaplain visited with the patient after receiving a consult for an AD. The patient's daughter actually wanted a POA and the patient said that he did not want that. There is no need for the chaplain to follow-up.  Brion Aliment Chaplain Resident For questions concerning this note please contact me by pager [phone]

## 2019-09-19 NOTE — TOC Initial Note (Addendum)
Transition of Care [facility]) - Initial/Assessment Note    Patient Details  Name: [name] MRN: [id number] Date of Birth: 09-03-1945  Transition of Care [facility] CM/SW Contact:    Pollie Friar, RN Phone Number: 09/19/2019, 4:26 PM  Clinical Narrative:                 CM met with the patient and his daughter in the room. Recommendations are for [facility] services and the daughter in agreement. Pt a little apprehensive. Daughter states either her or her mother will provide needed supervision at home.  Choice provided and they have no preference for [facility] services.  ToC following for further d/c needs.  MD will need [place] orders at d/c.   Expected Discharge Plan: [place] Barriers to Discharge: Continued Medical Work up   Patient Goals and CMS Choice   CMS Medicare.gov Compare Post Acute Care list provided to:: Patient Represenative (must comment) Choice offered to / list presented to : Patient, Adult Children  Expected Discharge Plan and Services Expected Discharge Plan: [place]   Discharge Planning Services: CM Consult Post Acute Care Choice: [place] arrangements for the past 2 months: Single Family Home                           HH Arranged: PT, OT          Prior Living Arrangements/Services Living arrangements for the past 2 months: Single Family Home Lives with:: Spouse Patient language and need for interpreter reviewed:: No Do you feel safe going back to the place where you live?: Yes      Need for Family Participation in Patient Care: Yes (Comment) Care giver support system in place?: Yes (comment)(daughter and spouse) Current home services: DME(walker) Criminal Activity/Legal Involvement Pertinent to Current Situation/Hospitalization: No - Comment as needed  Activities of Daily Living Home Assistive Devices/Equipment: Walker (specify type) ADL Screening (condition at time of admission) Patient's cognitive ability  adequate to safely complete daily activities?: Yes Is the patient deaf or have difficulty hearing?: No Does the patient have difficulty seeing, even when wearing glasses/contacts?: No Does the patient have difficulty concentrating, remembering, or making decisions?: Yes Patient able to express need for assistance with ADLs?: No Does the patient have difficulty dressing or bathing?: Yes Independently performs ADLs?: No Communication: Independent Dressing (OT): Needs assistance Is this a change from baseline?: Pre-admission baseline Grooming: Independent Feeding: Independent Bathing: Independent Toileting: Needs assistance Is this a change from baseline?: Pre-admission baseline In/Out Bed: Independent Walks in Home: Independent Does the patient have difficulty walking or climbing stairs?: No Weakness of Legs: None Weakness of Arms/Hands: None  Permission Sought/Granted                  Emotional Assessment Appearance:: Appears stated age Attitude/Demeanor/Rapport: Apprehensive Affect (typically observed): Defensive Orientation: : Oriented to Self, Oriented to Place, Oriented to  Time, Oriented to Situation   Psych Involvement: No (comment)  Admission diagnosis:  Subarachnoid hemorrhage (HCC) [I60.9] Thrombocytopenia (HCC) [D69.6] ESRD on dialysis ([place]) [N18.6, Z99.2] Intraventricular hemorrhage ([place]) [I61.5] Abducens (sixth) nerve palsy, 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 Thrombocytopenia ([place]) 09/15/2019  . Subarachnoid hemorrhage ([place]) 09/14/2019  . Ascites 09/01/2019  . Abnormal CT of the abdomen 09/01/2019  . Acute respiratory disease due to COVID-19 virus 07/23/2019  .  Intractable pain 04/19/2019  . Elevated d-dimer 04/19/2019  . RUQ abdominal pain 11/18/2017  . Acute CHF (congestive heart failure) ([place]) 09/15/2016  . Essential hypertension  08/14/2016  . Headache 08/14/2016  . Hyperlipidemia LDL goal <70 08/14/2016  . Abnormal nuclear stress test   . 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 Acute respiratory failure with hypoxia ([place]) 04/27/2016  . Hypoxia   . Hematoma 04/25/2016  . Mass of chest wall, left   . Abnormal partial thromboplastin time (PTT)   . 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 Coronary artery disease due to lipid rich plaque 04/15/2016  . Leukocytosis 04/15/2016  . Diabetes mellitus ([place]) 04/15/2016  . Pleuritic pain 04/15/2016  . ESRD on hemodialysis ([place]) 04/15/2016  . Left shoulder pain 04/15/2016  . History of adenomatous polyp of colon 01/21/2013  . GERD (gastroesophageal reflux disease) 01/21/2013   PCP:  [name], MD Pharmacy:   [facility] [address] [place] [address] Phone: [phone] Fax: [phone]     Social Determinants of Health (SDOH) Interventions    Readmission Risk Interventions Readmission Risk Prevention Plan 07/26/2019  Transportation Screening Complete  Medication Review (RN Care Manager) Complete  PCP or Specialist appointment within 3-5 days of discharge Complete  HRI or [place] Not Complete  HRI or Home Care Consult Pt Refusal Comments not needed  SW Recovery Care/Counseling Consult Complete  Palliative Care Screening Not [place] Not Applicable  Some recent data might be hidden

## 2019-09-19 NOTE — Progress Notes (Signed)
  NEUROSURGERY PROGRESS NOTE   No issues overnight.  Refuses dialysis here at [facility].  No new N/T/W  EXAM:  BP 136/72 (BP Location: Right Arm)   Pulse 70   Temp 97.8 F (36.6 C) (Oral)   Resp 18   Ht 6' (1.829 m)   Wt 77 kg   SpO2 95%   BMI 23.02 kg/m   Awake, alert, oriented  Speech fluent, appropriate  CN grossly intact, left CN6 palsy 5/5 BUE/BLE  No drift  IMPRESSION/PLAN 74 y.o. male with CTA negative, ?likely traumatic SAH and IVH noted on CT head during work up for recurrent falls. Remains neurologically stable with left CN6 palsy. Patient is refusing dialysis. Not sure diagnostic cerebral angiogram with contrast is in his best interest. Reviewed with Dr Kathyrn Sheriff. Will cancel angio and obtain MRA brain w/o contrast. If negative, can discharge home to resume dialysis at patient's preferred facility.

## 2019-09-19 NOTE — Progress Notes (Signed)
Called to receive report from primary nurse for hemodialysis. Pt is refusing to come for treatment. Nurse states pt.also Stated this morning at shift change he wasn't coming to treatment. Dr. Augustin Coupe telephoned and made aware. MD will see pt.

## 2019-09-19 NOTE — Progress Notes (Signed)
PT Cancellation Note  Patient Details Name: [name] MRN: [id number] DOB: 02-19-1946   Cancelled Treatment:    Reason Eval/Treat Not Completed: Patient declined, no reason specified.  Pt did not feel up to walking with PT today.  He reports walking with OT earlier.  He is supposed to go to HD and then to a procedure later today, so PT will check back tomorrow.  Pt agreeable to this plan.  Thanks,  Verdene Lennert, PT, DPT  Acute Rehabilitation [phone] pager [phone] office  @ [facility]: [phone]     Harvie Heck 09/19/2019, 1:07 PM

## 2019-09-19 NOTE — Progress Notes (Signed)
Dr. Augustin Coupe states he spoke with  pt. And pt. is agreeing to come to HD tx as planned. This nurse called transport for pt. Pickup.

## 2019-09-20 ENCOUNTER — Ambulatory Visit: Payer: Medicare Other | Admitting: Nurse Practitioner

## 2019-09-20 ENCOUNTER — Inpatient Hospital Stay (HOSPITAL_COMMUNITY): Payer: Medicare Other

## 2019-09-20 DIAGNOSIS — H4922 Sixth [abducent] nerve palsy, left eye: Secondary | ICD-10-CM

## 2019-09-20 DIAGNOSIS — R140 Abdominal distension (gaseous): Secondary | ICD-10-CM

## 2019-09-20 HISTORY — PX: IR PARACENTESIS: IMG2679

## 2019-09-20 LAB — BASIC METABOLIC PANEL
Anion gap: 9 (ref 5–15)
BUN: 52 mg/dL — ABNORMAL HIGH (ref 8–23)
CO2: 23 mmol/L (ref 22–32)
Calcium: 8.2 mg/dL — ABNORMAL LOW (ref 8.9–10.3)
Chloride: 100 mmol/L (ref 98–111)
Creatinine, Ser: 5.75 mg/dL — ABNORMAL HIGH (ref 0.61–1.24)
GFR calc Af Amer: 10 mL/min — ABNORMAL LOW (ref >60)
GFR calc non Af Amer: 9 mL/min — ABNORMAL LOW (ref >60)
Glucose, Bld: 92 mg/dL (ref 70–99)
Potassium: 4.6 mmol/L (ref 3.5–5.1)
Sodium: 132 mmol/L — ABNORMAL LOW (ref 135–145)

## 2019-09-20 LAB — GLUCOSE, CAPILLARY
Glucose-Capillary: 95 mg/dL (ref 70–99)
Glucose-Capillary: 97 mg/dL (ref 70–99)

## 2019-09-20 MED ORDER — LIDOCAINE HCL 1 % IJ SOLN
INTRAMUSCULAR | Status: AC
  Filled 2019-09-20: qty 20

## 2019-09-20 MED ORDER — LIDOCAINE HCL (PF) 1 % IJ SOLN
INTRAMUSCULAR | Status: DC | PRN
  Administered 2019-09-20: 10 mL

## 2019-09-20 NOTE — Progress Notes (Signed)
Patient returned from procedure, vital signs stable. Patient cleared for discharge. All home medications returned to patient. Personal belongings collected and sent home with daughter. Discharge instructions reviewed with patient and daughter, both verbalized understanding. Patient discharged via wheelchair to personal vehicle.

## 2019-09-20 NOTE — Discharge Summary (Signed)
Physician Discharge Summary  [name] YSA:[id number] DOB: 1946/01/29 DOA: 09/14/2019  PCP: [name], MD  Admit date: 09/14/2019 Discharge date: 09/20/2019  Admitted From: Home isposition:  Home  Recommendations for Outpatient Follow-up:  1. Follow up with neurosurgery in 1-2 weeks  Home Health:No Equipment/Devices:None  Discharge Condition:Stable CODE STATUS:Full Diet recommendation: Heart Healthy   Brief/Interim Summary: 74 y.o. 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  Apparently had a brief syncopal episode after his dialysis on 09/12/2019.  ct of the head done on admission showed subarachnoid hemorrhage in the prepontine cistern.  The emergency room physician consulted neurosurgery who recommended transfer from [facility] to [facility].  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 Neurosurgery was consulted who recommended an angiogram that was scheduled for 09/19/2019 due to the patient resistance to dialysis in the hospital, he is scheduled for an MRI that showed no vascular malformations and they think his traumatic.  Liver cirrhosis with ascites status post paracentesis on  09/14/2019: On admission he had 4.3 L removed he was continue fluid restriction, on the day of discharge paracentesis was performed he will continue to follow-up with GI as an outpatient.  Chronic thrombocytopenia: In the setting of cirrhosis and low dysplastic syndrome.  End-stage renal disease on hemodialysis Monday Wednesday and Friday: Renal was consulted dialysis was continued but he has refused dialysis at times limiting 8 8 to 2 hours.  Myleodysplastic syndrome: Follow-up with oncology as an outpatient his hemoglobin and platelets are borderline low which is chronic.  Controlled diabetes mellitus type 2 fairly controlled no change made to his medication.  Essential hypertension: Continue current home regimen.   Discharge Instructions  Discharge Instructions    Diet - low sodium heart healthy   Complete by: As directed    Increase activity slowly   Complete by: As directed      Allergies as of 09/20/2019      Reactions   Hydrocodone Itching      Medication List    TAKE these medications   acetaminophen 500 MG tablet Commonly known as: TYLENOL Take 1,000 mg by mouth 2 (two) times daily as needed for mild pain or moderate pain.   DULoxetine 60 MG capsule Commonly known as: CYMBALTA Take 1 capsule (60 mg total) by mouth daily.   HYDROcodone-acetaminophen 5-325 MG tablet Commonly known as: NORCO/VICODIN Take 1 tablet by mouth 3 (three) times daily.   labetalol 100 MG tablet Commonly known as: NORMODYNE Take 100 mg by mouth 2 (two) times daily.   nitroGLYCERIN 0.4 MG SL tablet Commonly known as: NITROSTAT Place 1 tablet (0.4 mg total) under the tongue every 5 (five) minutes x 3 doses as needed for chest pain (if no relief after 3rd dose, proceed to the ED for an  evaluation or call 911).   pravastatin 40 MG tablet Commonly known as: PRAVACHOL Take 40 mg by mouth daily.   ruxolitinib phosphate 5 MG tablet Commonly known as: JAKAFI Take 5 mg by mouth daily.    sevelamer carbonate 800 MG tablet Commonly known as: RENVELA Take 800 mg by mouth 3 (three) times daily with meals. Takes 3 tablets by mouth 3 times daily with meals.   traZODone 50 MG tablet Commonly known as: DESYREL Take 1 tablet (50 mg total) by mouth at bedtime as needed for sleep.      Follow-up Information    [facility].   Specialty: Home Health Services Why: The home health will contact you for the first home visit. Contact information: [place] [address] [phone]          Allergies  Allergen Reactions  . Hydrocodone Itching    Consultations:  Neurosurgery   Procedures/Studies: CT Abdomen Pelvis Wo Contrast  Result Date: 08/24/2019 CLINICAL DATA:  Abdominal distension, pain and swelling, end-stage renal disease EXAM: CT ABDOMEN AND PELVIS WITHOUT CONTRAST TECHNIQUE: Multidetector CT imaging of the abdomen and pelvis was performed following the standard protocol without IV contrast. COMPARISON:  07/03/2019 FINDINGS: Lower chest: There is patchy ground-glass airspace disease most pronounced within the right lower lobe. Small bilateral pleural effusions are noted. Extensive atherosclerosis of the coronary vasculature. Central venous catheter tip at the atrial caval junction. Hepatobiliary: Stable hepatomegaly. Slight nodular contour of the capsule suggests underlying cirrhosis. No focal liver abnormality. The gallbladder is unremarkable. No biliary dilatation. Pancreas: Unremarkable. No pancreatic ductal dilatation or surrounding inflammatory changes. Spleen: Continued splenomegaly measuring up to 14.8 cm in anterior-posterior dimension. Adrenals/Urinary Tract: Bilateral renal cortical atrophy compatible with known end-stage renal disease and dialysis dependence. No urinary tract calculi or obstruction. The bladder is unremarkable. The adrenals are stable. Stomach/Bowel: Small hiatal hernia.  No bowel obstruction or ileus.  Vascular/Lymphatic: Stable extensive atherosclerosis of the abdominal aorta and its branches. No pathologically enlarged lymph nodes. Reproductive: Choose 3 Other: Large volume ascites throughout the abdomen and pelvis, increased since prior study. No evidence of abdominal wall hernia. Musculoskeletal: Bony changes of renal osteodystrophy. No acute fractures. Reconstructed images demonstrate no additional findings. Injection granulomata within the gluteal regions unchanged. IMPRESSION: 1. Large volume ascites, increased since prior study. 2. Persistent hepatosplenomegaly. 3. Patchy ground-glass airspace disease within the right lung base. This is a nonspecific finding, and could reflect atypical pneumonia given recent COVID-19 diagnosis 07/23/2019. 4. Small bilateral pleural effusions. Electronically Signed   By: Randa Ngo M.D.   On: 08/24/2019 08:55   CT Angio Head W/Cm &/Or Wo Cm  Result Date: 09/14/2019 CLINICAL DATA:  Subarachnoid hemorrhage [facility]) suspected. Additional history provided: Patient fell 2 weeks ago and again 2 days ago, does not remember if head trauma, denies loss of consciousness, double vision and headaches. EXAM: CT ANGIOGRAPHY HEAD TECHNIQUE: Multidetector CT imaging of the head was performed using the standard protocol during bolus administration of intravenous contrast. Multiplanar CT image reconstructions and MIPs were obtained to evaluate the vascular anatomy. CONTRAST:  145mL OMNIPAQUE IOHEXOL 350 MG/ML SOLN COMPARISON:  Noncontrast head CT performed earlier the same day prior noncontrast head CT examinations 09/14/2019 and earlier FINDINGS: CT HEAD Brain: Again demonstrated is subarachnoid hemorrhage within the prepontine cistern, eccentric to the left. The hemorrhage has not significantly changed in size or extent from head CT performed earlier the same day, again measuring up to 15 mm in greatest  thickness (remeasured on prior). Unchanged mild mass effect upon the pons.  Unchanged small volume intraventricular hemorrhage within the dependent lateral ventricles. The ventricles are unchanged in size and configuration without evidence of hydrocephalus. Redemonstrated focus of encephalomalacia within the anterolateral right temporal lobe, which may be posttraumatic or post ischemic in etiology. 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 Ill-defined hypodensity within the cerebral white matter is nonspecific, but consistent with chronic small vessel ischemic disease. Stable, mild generalized parenchymal atrophy. Vascular: Reported separately. Skull: Normal. Negative for fracture or focal lesion. Sinuses: No significant paranasal sinus disease or mastoid effusion at the imaged levels. Orbits: Visualized orbits demonstrate no acute abnormality. CTA HEAD Anterior circulation: The intracranial internal carotid arteries are patent with prominent atherosclerotic calcification. Sites of mild to moderate stenosis within the right ICA. Sites of narrowing within the left ICA, greatest within the paraclinoid segment (at least moderate in severity at this site). The M1 middle cerebral arteries are patent without significant stenosis. No M2 proximal branch occlusion or high-grade proximal stenosis is identified. The anterior cerebral arteries are patent without high-grade proximal stenosis. No anterior circulation aneurysm is identified Posterior circulation: The non dominant intracranial right vertebral artery is developmentally diminutive beyond the origin of the right PICA, although patent. The dominant intracranial left vertebral artery is patent. Calcified plaque in the V4 left vertebral artery is incompletely imaged but contributes to moderate stenosis at the visualized levels. The basilar artery is patent without significant stenosis. The posterior cerebral arteries are patent bilaterally. Mild narrowing within the proximal P2 right PCA. There is a sizable left  posterior communicating artery with hypoplastic left P1 segment. A right posterior communicating artery is not definitive identified. No posterior circulation aneurysm is identified. Venous sinuses: Within limitations of contrast timing, no convincing thrombus. Anatomic variants: As described IMPRESSION: CT head: 1. Unchanged prepontine subarachnoid hemorrhage and small volume hemorrhage layering within the lateral ventricles. No evidence of interval hemorrhage. No evidence of hydrocephalus on the current exam. 2. Additional chronic findings without interval change as described. CTA head: 1. No intracranial aneurysm is identified at the imaged levels. 2. Intracranial atherosclerotic disease with multifocal stenoses, most notably as follows. 3. Prominent calcified plaque within the intracranial internal carotid arteries bilaterally. Sites of mild/moderate stenosis within the right ICA. Narrowing of the intracranial left ICA is greatest within the left paraclinoid segment, at least moderate at this site. 4. Calcified plaque results in moderate stenosis of the dominant V4 left vertebral artery. Electronically Signed   By: Kellie Simmering DO   On: 09/14/2019 12:38   CT HEAD WO CONTRAST  Result Date: 09/15/2019 CLINICAL DATA:  Subarachnoid hemorrhage. EXAM: CT HEAD WITHOUT CONTRAST TECHNIQUE: Contiguous axial images were obtained from the base of the skull through the vertex without intravenous contrast. COMPARISON:  CT angiogram head 09/14/2019, head CT 09/14/2019 FINDINGS: Brain: Subarachnoid hemorrhage within the prepontine cistern, eccentric to the left, has not significantly changed in extent as compared to prior examination 09/14/2019. This again measures up to 15 mm in greatest thickness. Similar appearance of mild mass effect upon the left anterolateral pons. Again demonstrated is small volume hemorrhage layering dependently within the lateral ventricles. Hemorrhage within the right lateral ventricle as  minimally increased, likely due to redistribution. The ventricles are unchanged in size and configuration without evidence of hydrocephalus. No acute demarcated cortical infarct. Redemonstrated chronic encephalomalacia within the anterolateral right temporal lobe, which may be posttraumatic or post-ischemic in etiology. Redemonstrated chronic infarcts within the bilateral basal  ganglia and left cerebellum. Ill-defined hypoattenuation within the cerebral white matter is nonspecific, but consistent with chronic small vessel ischemic disease. Stable, mild generalized parenchymal atrophy. Vascular: No hyperdense vessel.  Atherosclerotic calcifications. Skull: Normal. Negative for fracture or focal lesion. Sinuses/Orbits: Visualized orbits demonstrate no acute abnormality. No significant paranasal sinus disease or mastoid effusion at the imaged levels. IMPRESSION: 1. Prepontine subarachnoid hemorrhage has not significantly changed since prior exam 09/14/2019. Mild mass effect upon the pons. Redemonstrated small volume hemorrhage layering within the lateral ventricles. Hemorrhage within the right ventricle has minimally increased, likely due to redistribution. No evidence of hydrocephalus. 2. Additional chronic findings without interval change as described. Electronically Signed   By: Kellie Simmering DO   On: 09/15/2019 11:06   CT HEAD WO CONTRAST  Result Date: 09/14/2019 CLINICAL DATA:  Fall at dialysis with headache EXAM: CT HEAD WITHOUT CONTRAST CT CERVICAL SPINE WITHOUT CONTRAST TECHNIQUE: Multidetector CT imaging of the head and cervical spine was performed following the standard protocol without intravenous contrast. Multiplanar CT image reconstructions of the cervical spine were also generated. COMPARISON:  None. FINDINGS: CT HEAD FINDINGS Brain: Left para median prepontine subarachnoid hemorrhage measuring 11 mm thickness. There is a reported left 6 nerve palsy which correlates with this location.  Intraventricular hemorrhage is seen layering in the lateral ventricles, likely intraventricular reflux. There is history of syncope and fall. Encephalomalacia in the inferior right temporal lobe which could be posttraumatic or ischemic. There have been remote small vessel infarcts at the basal ganglia. No hydrocephalus when accounting for atrophy. Vascular: No hyperdense vessel or unexpected calcification. Skull: Normal. Negative for fracture or focal lesion. Sinuses/Orbits: Negative Critical Value/emergent results were called by telephone at the time of interpretation on 09/14/2019 at 10:26 am to provider [facility] , who verbally acknowledged these results. CT CERVICAL SPINE FINDINGS Alignment: No traumatic malalignment Skull base and vertebrae: Negative for acute fracture Soft tissues and spinal canal: No prevertebral fluid or swelling. No visible canal hematoma. Disc levels: Lower cervical disc and upper cervical facet degeneration. Upper chest: Negative IMPRESSION: 1. Subarachnoid hemorrhage in the prepontine cistern that is left eccentric and fairly focal/thick. Reportedly there was preceding syncope and fall. 2. Small volume hemorrhage layering in the lateral ventricles. 3. Negative for cervical spine fracture. 4. Remote small vessel infarcts. Electronically Signed   By: Monte Fantasia M.D.   On: 09/14/2019 10:29   CT CERVICAL SPINE WO CONTRAST  Result Date: 09/14/2019 CLINICAL DATA:  Fall at dialysis with headache EXAM: CT HEAD WITHOUT CONTRAST CT CERVICAL SPINE WITHOUT CONTRAST TECHNIQUE: Multidetector CT imaging of the head and cervical spine was performed following the standard protocol without intravenous contrast. Multiplanar CT image reconstructions of the cervical spine were also generated. COMPARISON:  None. FINDINGS: CT HEAD FINDINGS Brain: Left para median prepontine subarachnoid hemorrhage measuring 11 mm thickness. There is a reported left 6 nerve palsy which correlates with this location.  Intraventricular hemorrhage is seen layering in the lateral ventricles, likely intraventricular reflux. There is history of syncope and fall. Encephalomalacia in the inferior right temporal lobe which could be posttraumatic or ischemic. There have been remote small vessel infarcts at the basal ganglia. No hydrocephalus when accounting for atrophy. Vascular: No hyperdense vessel or unexpected calcification. Skull: Normal. Negative for fracture or focal lesion. Sinuses/Orbits: Negative Critical Value/emergent results were called by telephone at the time of interpretation on 09/14/2019 at 10:26 am to provider [facility] , who verbally acknowledged these results. CT CERVICAL SPINE FINDINGS Alignment: No traumatic  malalignment Skull base and vertebrae: Negative for acute fracture Soft tissues and spinal canal: No prevertebral fluid or swelling. No visible canal hematoma. Disc levels: Lower cervical disc and upper cervical facet degeneration. Upper chest: Negative IMPRESSION: 1. Subarachnoid hemorrhage in the prepontine cistern that is left eccentric and fairly focal/thick. Reportedly there was preceding syncope and fall. 2. Small volume hemorrhage layering in the lateral ventricles. 3. Negative for cervical spine fracture. 4. Remote small vessel infarcts. Electronically Signed   By: Monte Fantasia M.D.   On: 09/14/2019 10:29   MR ANGIO HEAD WO CONTRAST  Result Date: 09/19/2019 CLINICAL DATA:  Subarachnoid hemorrhage, follow-up EXAM: MRI HEAD WITHOUT CONTRAST MRA HEAD WITHOUT CONTRAST TECHNIQUE: Multiplanar, multiecho pulse sequences of the brain and surrounding structures were obtained without intravenous contrast. Angiographic images of the head were obtained using MRA technique without contrast. COMPARISON:  Prior CT imaging FINDINGS: MRI HEAD FINDINGS DWI, axial T2, sagittal T1, and axial T2 FLAIR sequences were obtained. Patient could not tolerate remainder of the study. Motion artifact is present and below  findings are within this limitation. Brain: There is residual layering intraventricular hemorrhage within the occipital horns. Residual subarachnoid hemorrhage is present along the left prepontine cistern. Minimal right parietal juxtacortical/subcortical diffusion hyperintensity may reflect residual blood products or infarction. Patchy and confluent areas of T2 hyperintensity in the supratentorial white matter nonspecific but probably reflect moderate chronic microvascular ischemic changes. There are chronic small vessel infarcts of the corona radiata bilaterally. There is encephalomalacia and gliosis along the peripheral right temporal lobe. Ventricles are stable in size. Vascular: Major vessel flow voids at the skull base are preserved. Skull and upper cervical spine: Unremarkable. Sinuses/Orbits: Trace paranasal sinus mucosal thickening. Orbits are unremarkable. Other: Trace mastoid fluid opacification. MRA HEAD FINDINGS Intracranial internal carotid arteries are patent. Middle and anterior cerebral arteries are patent. Intracranial vertebral arteries, basilar artery, posterior cerebral arteries are patent. Left AICA traverses the region of the hemorrhage. Left posterior communicating artery is identified. There is no aneurysm. IMPRESSION: Partial MRI of the head with motion artifact. Residual subarachnoid hemorrhage within the left prepontine cistern and lateral ventricles. Small linear right parietal parenchymal hemorrhage or recent infarction. No evidence of aneurysm on MRA. Electronically Signed   By: Macy Mis M.D.   On: 09/19/2019 17:35   MR BRAIN WO CONTRAST  Result Date: 09/19/2019 CLINICAL DATA:  Subarachnoid hemorrhage, follow-up EXAM: MRI HEAD WITHOUT CONTRAST MRA HEAD WITHOUT CONTRAST TECHNIQUE: Multiplanar, multiecho pulse sequences of the brain and surrounding structures were obtained without intravenous contrast. Angiographic images of the head were obtained using MRA technique without  contrast. COMPARISON:  Prior CT imaging FINDINGS: MRI HEAD FINDINGS DWI, axial T2, sagittal T1, and axial T2 FLAIR sequences were obtained. Patient could not tolerate remainder of the study. Motion artifact is present and below findings are within this limitation. Brain: There is residual layering intraventricular hemorrhage within the occipital horns. Residual subarachnoid hemorrhage is present along the left prepontine cistern. Minimal right parietal juxtacortical/subcortical diffusion hyperintensity may reflect residual blood products or infarction. Patchy and confluent areas of T2 hyperintensity in the supratentorial white matter nonspecific but probably reflect moderate chronic microvascular ischemic changes. There are chronic small vessel infarcts of the corona radiata bilaterally. There is encephalomalacia and gliosis along the peripheral right temporal lobe. Ventricles are stable in size. Vascular: Major vessel flow voids at the skull base are preserved. Skull and upper cervical spine: Unremarkable. Sinuses/Orbits: Trace paranasal sinus mucosal thickening. Orbits are unremarkable. Other: Trace mastoid fluid opacification.  MRA HEAD FINDINGS Intracranial internal carotid arteries are patent. Middle and anterior cerebral arteries are patent. Intracranial vertebral arteries, basilar artery, posterior cerebral arteries are patent. Left AICA traverses the region of the hemorrhage. Left posterior communicating artery is identified. There is no aneurysm. IMPRESSION: Partial MRI of the head with motion artifact. Residual subarachnoid hemorrhage within the left prepontine cistern and lateral ventricles. Small linear right parietal parenchymal hemorrhage or recent infarction. No evidence of aneurysm on MRA. Electronically Signed   By: Macy Mis M.D.   On: 09/19/2019 17:35   US Paracentesis  Result Date: 09/14/2019 INDICATION: Cirrhosis Recurrent ascites EXAM: ULTRASOUND GUIDED  PARACENTESIS MEDICATIONS: 10 cc  1% lidocaine COMPLICATIONS: None immediate. PROCEDURE: Informed written consent was obtained from the patient after a discussion of the risks, benefits and alternatives to treatment. A timeout was performed prior to the initiation of the procedure. Initial ultrasound scanning demonstrates a large amount of ascites within the right lower abdominal quadrant. The right lower abdomen was prepped and draped in the usual sterile fashion. 1% lidocaine was used for local anesthesia. Following this, a 19 g Yueh catheter was introduced. An ultrasound image was saved for documentation purposes. The paracentesis was performed. The catheter was removed and a dressing was applied. The patient tolerated the procedure well without immediate post procedural complication. FINDINGS: A total of approximately 4.3 liters of cloudy milky fluid was removed. Samples were sent to the laboratory as requested by the clinical team. IMPRESSION: Successful ultrasound-guided paracentesis yielding 4.3 liters of peritoneal fluid. Read by Lavonia Drafts [facility] Electronically Signed   By: Lavonia Dana M.D.   On: 09/14/2019 09:07   US Paracentesis  Result Date: 09/09/2019 INDICATION: Ascites. EXAM: ULTRASOUND GUIDED LEFT PARACENTESIS MEDICATIONS: None COMPLICATIONS: None immediate. PROCEDURE: Informed written consent was obtained from the patient after a discussion of the risks, benefits and alternatives to treatment. A timeout was performed prior to the initiation of the procedure. Initial ultrasound scanning demonstrates a large amount of ascites within the left lower abdominal quadrant. The left lower abdomen was prepped and draped in the usual sterile fashion. 1% lidocaine was used for local anesthesia. Following this, an 8 Fr Safe-T-Centesis catheter was introduced. An ultrasound image was saved for documentation purposes. The paracentesis was performed. The catheter was removed and a dressing was applied. The patient tolerated the procedure well  without immediate post procedural complication. FINDINGS: A total of approximately 4.6 L of whitish fluid was removed. Samples were sent to the laboratory as requested by the clinical team. IMPRESSION: Successful ultrasound-guided paracentesis yielding 4.6 liters of peritoneal fluid. Electronically Signed   By: Lorriane Shire M.D.   On: 09/09/2019 10:54   US Paracentesis  Result Date: 09/05/2019 INDICATION: Ascites EXAM: ULTRASOUND GUIDED left PARACENTESIS COMPLICATIONS: None immediate. PROCEDURE: Informed written consent was obtained from the patient after a discussion of the risks, benefits and alternatives to treatment. A timeout was performed prior to the initiation of the procedure. Initial ultrasound scanning demonstrates a large amount of ascites within the left lower abdominal quadrant. The left lower abdomen was prepped and draped in the usual sterile fashion. 1% lidocaine was used for local anesthesia. Following this, a yueh catheter was introduced. An ultrasound image was saved for documentation purposes. The paracentesis was performed. The catheter was removed and a dressing was applied. The patient tolerated the procedure well without immediate post procedural complication. FINDINGS: FINDINGS A total of approximately 7 L of straw-colored fluid was removed. Samples were sent to the laboratory as  requested by the clinical team. IMPRESSION: Successful ultrasound-guided paracentesis yielding 7 liters of peritoneal fluid. Electronically Signed   By: Van Clines M.D.   On: 09/05/2019 16:14   US Paracentesis  Result Date: 09/01/2019 INDICATION: Ascites Probable underlying Cirrhosis EXAM: ULTRASOUND GUIDED RLQ PARACENTESIS MEDICATIONS: 10 cc 1% lidocaine COMPLICATIONS: None immediate. PROCEDURE: Informed written consent was obtained from the patient after a discussion of the risks, benefits and alternatives to treatment. A timeout was performed prior to the initiation of the procedure. Initial  ultrasound scanning demonstrates a large amount of ascites within the right lower abdominal quadrant. The right lower abdomen was prepped and draped in the usual sterile fashion. 1% lidocaine was used for local anesthesia. Following this, a 19 g Yueh catheter was introduced. An ultrasound image was saved for documentation purposes. The paracentesis was performed. The catheter was removed and a dressing was applied. The patient tolerated the procedure well without immediate post procedural complication. Patient received post-procedure intravenous albumin; see nursing notes for details. FINDINGS: A total of approximately 4 liters of yellow fluid was removed. Samples were sent to the laboratory as requested by the clinical team. IMPRESSION: Successful ultrasound-guided paracentesis yielding 4 liters of peritoneal fluid. Read by Lavonia [facility] Electronically Signed   By: Lavonia Dana M.D.   On: 09/01/2019 13:58   IR Removal Tun Cv Cath W/O FL  Result Date: 09/06/2019 INDICATION: Patient with history of end-stage renal disease on hemodialysis previously via tunneled right IJ hemodialysis catheter, now with working AV fistula. Request to IR for removal of tunneled HD catheter. EXAM: REMOVAL OF TUNNELED HEMODIALYSIS CATHETER MEDICATIONS: 6 mL 1% lidocaine COMPLICATIONS: None immediate. PROCEDURE: Informed written consent was obtained from the patient following an explanation of the procedure, risks, benefits and alternatives to treatment. A time out was performed prior to the initiation of the procedure. Maximal barrier sterile technique was utilized including caps, mask, sterile gowns, sterile gloves, large sterile drape, hand hygiene, and Hibiclens. 1% lidocaine was injected under sterile conditions along the subcutaneous tunnel. Utilizing a combination of blunt dissection and gentle traction, the catheter was removed intact. Hemostasis was obtained with manual compression. A dressing was placed. The patient  tolerated the procedure well without immediate post procedural complication. IMPRESSION: Successful removal of tunneled dialysis catheter. Read by Candiss Norse, PA-C Electronically Signed   By: Jerilynn Mages.  Shick M.D.   On: 09/06/2019 15:15   DG Chest Port 1 View  Result Date: 08/24/2019 CLINICAL DATA:  Chest pain in a dialysis patient EXAM: PORTABLE CHEST 1 VIEW COMPARISON:  07/23/2019 FINDINGS: Perma catheter on the right with tip at the right atrium, unchanged. Low volume chest with interstitial prominence. Borderline heart size. No effusion or pneumothorax. IMPRESSION: Low volume chest with vascular congestion. Airspace opacity has improved from 07/23/2019. Electronically Signed   By: Monte Fantasia M.D.   On: 08/24/2019 05:46    (Echo, Carotid, EGD, Colonoscopy, ERCP)    Subjective: No complains today.  Discharge Exam: Vitals:   09/20/19 0351 09/20/19 0903  BP: (!) 158/72 137/70  Pulse: 80 70  Resp: 18 (!) 22  Temp: 97.9 F (36.6 C) 98.1 F (36.7 C)  SpO2: 100% 99%   Vitals:   09/19/19 1953 09/19/19 2308 09/20/19 0351 09/20/19 0903  BP: (!) 156/80 133/74 (!) 158/72 137/70  Pulse: 71 70 80 70  Resp: 19 18 18  (!) 22  Temp: 98 F (36.7 C) 98 F (36.7 C) 97.9 F (36.6 C) 98.1 F (36.7 C)  TempSrc: Oral  Oral Oral Oral  SpO2: 100% 97% 100% 99%  Weight:      Height:        General: Pt is alert, awake, not in acute distress Cardiovascular: RRR, S1/S2 +, no rubs, no gallops Respiratory: CTA bilaterally, no wheezing, no rhonchi Abdominal: Soft, NT, ND, bowel sounds + Extremities: no edema, no cyanosis    The results of significant diagnostics from this hospitalization (including imaging, microbiology, ancillary and laboratory) are listed below for reference.     Microbiology: Recent Results (from the past 240 hour(s))  Culture, body fluid-bottle     Status: None   Collection Time: 09/14/19  8:24 AM   Specimen: Ascitic  Result Value Ref Range Status   Specimen  Description ASCITIC  Final   Special Requests BOTTLES DRAWN AEROBIC AND ANAEROBIC 10CC  Final   Culture   Final    NO GROWTH 5 DAYS Performed at [facility], [address], [place], [place] [address]    Report Status 09/19/2019 FINAL  Final  Gram stain     Status: None   Collection Time: 09/14/19  8:24 AM   Specimen: Ascitic  Result Value Ref Range Status   Specimen Description ASCITIC  Final   Special Requests NONE  Final   Gram Stain   Final    NO ORGANISMS SEEN WBC PRESENT, PREDOMINANTLY MONONUCLEAR CYTOSPIN SMEAR Performed at [facility], [address] [place], [place] [address]    Report Status 09/14/2019 FINAL  Final  Respiratory Panel by RT PCR (Flu A&B, Covid) - Nasopharyngeal Swab     Status: Abnormal   Collection Time: 09/14/19 11:35 AM   Specimen: Nasopharyngeal Swab  Result Value Ref Range Status   SARS Coronavirus 2 by RT PCR POSITIVE (A) NEGATIVE Final    Comment: RESULT CALLED TO, READ BACK BY AND VERIFIED WITH: KENDRICK J. AT 1415 ON [id number] BY THOMPSON S. (NOTE) SARS-CoV-2 target nucleic acids are DETECTED. SARS-CoV-2 RNA is generally detectable in upper respiratory specimens  during the acute phase of infection. Positive results are indicative of the presence of the identified virus, but do not rule out bacterial infection or co-infection with other pathogens not detected by the test. Clinical correlation with patient history and other diagnostic information is necessary to determine patient infection status. The expected result is Negative. Fact Sheet for Patients:  PinkCheek.be Fact Sheet for Healthcare Providers: GravelBags.it This test is not yet approved or cleared by the [place] FDA and  has been authorized for detection and/or diagnosis of SARS-CoV-2 by FDA under an Emergency Use Authorization (EUA).  This EUA will remain in effect (meaning this test can b e used) for the duration of   the COVID-19 declaration under Section 564(b)(1) of the Act, 21 U.S.C. section 360bbb-3(b)(1), unless the authorization is terminated or revoked sooner.    Influenza A by PCR NEGATIVE NEGATIVE Final   Influenza B by PCR NEGATIVE NEGATIVE Final    Comment: (NOTE) The Xpert Xpress SARS-CoV-2/FLU/RSV assay is intended as an aid in  the diagnosis of influenza from Nasopharyngeal swab specimens and  should not be used as a sole basis for treatment. Nasal washings and  aspirates are unacceptable for Xpert Xpress SARS-CoV-2/FLU/RSV  testing. Fact Sheet for Patients: PinkCheek.be Fact Sheet for Healthcare Providers: GravelBags.it This test is not yet approved or cleared by the [place] FDA and  has been authorized for detection and/or diagnosis of SARS-CoV-2 by  FDA under an Emergency Use Authorization (EUA). This EUA will remain  in effect (meaning this test can be used) for the duration of the  Covid-19 declaration under Section 564(b)(1) of the Act, 21  U.S.C. section 360bbb-3(b)(1), unless the authorization is  terminated or revoked. Performed at [facility], [address], [place], [place] [address]   MRSA PCR Screening     Status: Abnormal   Collection Time: 09/15/19  3:20 PM   Specimen: Nasal Mucosa; Nasopharyngeal  Result Value Ref Range Status   MRSA by PCR POSITIVE (A) NEGATIVE Final    Comment:        The GeneXpert MRSA Assay (FDA approved for NASAL specimens only), is one component of a comprehensive MRSA colonization surveillance program. It is not intended to diagnose MRSA infection nor to guide or monitor treatment for MRSA infections. RESULT CALLED TO, READ BACK BY AND VERIFIED WITH: Steffanie Dunn RN 16:30 09/15/19 ([email or web address]) Performed at [facility], [place] [address] [place], [place] [address]      Labs: BNP (last 3 results) Recent Labs    07/03/19 1700  BNP 341.9*   Basic Metabolic  Panel: Recent Labs  Lab 09/14/19 0928 09/16/19 1024 09/18/19 0512 09/19/19 0504 09/20/19 0353  NA 137 133* 132* 131* 132*  K 4.0 4.5 4.6 4.5 4.6  CL 102 102 98 97* 100  CO2 25 23 25 24 23   GLUCOSE 139* 138* 103* 98 92  BUN 45* 49* 40* 45* 52*  CREATININE 4.19* 5.31* 5.17* 5.25* 5.75*  CALCIUM 8.4* 7.9* 8.0* 8.1* 8.2*   Liver Function Tests: Recent Labs  Lab 09/14/19 0928  AST 37  ALT 34  ALKPHOS 173*  BILITOT 0.5  PROT 5.4*  ALBUMIN 2.7*   No results for input(s): LIPASE, AMYLASE in the last 168 hours. No results for input(s): AMMONIA in the last 168 hours. CBC: Recent Labs  Lab 09/14/19 0928 09/16/19 1024 09/17/19 1156 09/18/19 0512 09/19/19 0504  WBC 8.2 6.2 6.2 6.4 6.7  HGB 9.0* 7.8* 7.9* 7.5* 7.8*  HCT 27.5* 23.6* 23.9* 22.6* 23.5*  MCV 96.5 93.7 94.1 93.0 94.0  PLT 67* 60* 58* 57* 64*   Cardiac Enzymes: No results for input(s): CKTOTAL, CKMB, CKMBINDEX, TROPONINI in the last 168 hours. BNP: Invalid input(s): POCBNP CBG: Recent Labs  Lab 09/19/19 0803 09/19/19 1211 09/19/19 1815 09/19/19 2111 09/20/19 0603  GLUCAP 95 87 96 106* 95   D-Dimer No results for input(s): DDIMER in the last 72 hours. Hgb A1c No results for input(s): HGBA1C in the last 72 hours. Lipid Profile No results for input(s): CHOL, HDL, LDLCALC, TRIG, CHOLHDL, LDLDIRECT in the last 72 hours. Thyroid function studies No results for input(s): TSH, T4TOTAL, T3FREE, THYROIDAB in the last 72 hours.  Invalid input(s): FREET3 Anemia work up No results for input(s): VITAMINB12, FOLATE, FERRITIN, TIBC, IRON, RETICCTPCT in the last 72 hours. Urinalysis    Component Value Date/Time   COLORURINE AMBER (A) 07/23/2019 1730   APPEARANCEUR HAZY (A) 07/23/2019 1730   LABSPEC 1.033 (H) 07/23/2019 1730   PHURINE 7.0 07/23/2019 1730   GLUCOSEU >=500 (A) 07/23/2019 1730   HGBUR NEGATIVE 07/23/2019 1730   BILIRUBINUR NEGATIVE 07/23/2019 1730   KETONESUR 20 (A) 07/23/2019 1730   PROTEINUR  >=300 (A) 07/23/2019 1730   UROBILINOGEN 0.2 03/26/2008 1920   NITRITE NEGATIVE 07/23/2019 1730   LEUKOCYTESUR NEGATIVE 07/23/2019 1730   Sepsis Labs Invalid input(s): PROCALCITONIN,  WBC,  LACTICIDVEN Microbiology Recent Results (from the past 240 hour(s))  Culture, body fluid-bottle     Status: None   Collection  Time: 09/14/19  8:24 AM   Specimen: Ascitic  Result Value Ref Range Status   Specimen Description ASCITIC  Final   Special Requests BOTTLES DRAWN AEROBIC AND ANAEROBIC 10CC  Final   Culture   Final    NO GROWTH 5 DAYS Performed at [facility], [address], [place], [place] [address]    Report Status 09/19/2019 FINAL  Final  Gram stain     Status: None   Collection Time: 09/14/19  8:24 AM   Specimen: Ascitic  Result Value Ref Range Status   Specimen Description ASCITIC  Final   Special Requests NONE  Final   Gram Stain   Final    NO ORGANISMS SEEN WBC PRESENT, PREDOMINANTLY MONONUCLEAR CYTOSPIN SMEAR Performed at [facility], [address] [place], [place] [address]    Report Status 09/14/2019 FINAL  Final  Respiratory Panel by RT PCR (Flu A&B, Covid) - Nasopharyngeal Swab     Status: Abnormal   Collection Time: 09/14/19 11:35 AM   Specimen: Nasopharyngeal Swab  Result Value Ref Range Status   SARS Coronavirus 2 by RT PCR POSITIVE (A) NEGATIVE Final    Comment: RESULT CALLED TO, READ BACK BY AND VERIFIED WITH: KENDRICK J. AT 1415 ON [id number] BY THOMPSON S. (NOTE) SARS-CoV-2 target nucleic acids are DETECTED. SARS-CoV-2 RNA is generally detectable in upper respiratory specimens  during the acute phase of infection. Positive results are indicative of the presence of the identified virus, but do not rule out bacterial infection or co-infection with other pathogens not detected by the test. Clinical correlation with patient history and other diagnostic information is necessary to determine patient infection status. The expected result is Negative. Fact Sheet  for Patients:  PinkCheek.be Fact Sheet for Healthcare Providers: GravelBags.it This test is not yet approved or cleared by the [place] FDA and  has been authorized for detection and/or diagnosis of SARS-CoV-2 by FDA under an Emergency Use Authorization (EUA).  This EUA will remain in effect (meaning this test can b e used) for the duration of  the COVID-19 declaration under Section 564(b)(1) of the Act, 21 U.S.C. section 360bbb-3(b)(1), unless the authorization is terminated or revoked sooner.    Influenza A by PCR NEGATIVE NEGATIVE Final   Influenza B by PCR NEGATIVE NEGATIVE Final    Comment: (NOTE) The Xpert Xpress SARS-CoV-2/FLU/RSV assay is intended as an aid in  the diagnosis of influenza from Nasopharyngeal swab specimens and  should not be used as a sole basis for treatment. Nasal washings and  aspirates are unacceptable for Xpert Xpress SARS-CoV-2/FLU/RSV  testing. Fact Sheet for Patients: PinkCheek.be Fact Sheet for Healthcare Providers: GravelBags.it This test is not yet approved or cleared by the [place] FDA and  has been authorized for detection and/or diagnosis of SARS-CoV-2 by  FDA under an Emergency Use Authorization (EUA). This EUA will remain  in effect (meaning this test can be used) for the duration of the  Covid-19 declaration under Section 564(b)(1) of the Act, 21  U.S.C. section 360bbb-3(b)(1), unless the authorization is  terminated or revoked. Performed at [facility], [address], [place], [place] [address]   MRSA PCR Screening     Status: Abnormal   Collection Time: 09/15/19  3:20 PM   Specimen: Nasal Mucosa; Nasopharyngeal  Result Value Ref Range Status   MRSA by PCR POSITIVE (A) NEGATIVE Final    Comment:        The GeneXpert MRSA Assay (FDA approved for NASAL specimens only), is  one component of a comprehensive MRSA  colonization surveillance program. It is not intended to diagnose MRSA infection nor to guide or monitor treatment for MRSA infections. RESULT CALLED TO, READ BACK BY AND VERIFIED WITH: Steffanie Dunn RN 16:30 09/15/19 ([email or web address]) Performed at [facility], [place] [address] [place] [address], [place], [place] [address]      Time coordinating discharge: Over 30 minutes  SIGNED:   Charlynne Cousins, MD  [facility] 09/20/2019, 9:54 AM Pager   If 7PM-7AM, please contact night-coverage [email or web address] Password [email or web address]

## 2019-09-20 NOTE — Consult Note (Signed)
   [facility] CM Inpatient Consult   09/20/2019  [name] 09/19/1945 [id number]  Patient screened for high risk score for unplanned readmission score and with 3 admissions and4 ED hospital visits in the past 6 months.  Chart reviewed to check if potential [place] [facility] services of patient in the [facility].  Review of patient's medical record from MD discharge summary notes which includes but not limited to reveals patient is:  [name] is an 74 y.o. 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  Apparently had a brief syncopal episode after his dialysis on 09/12/2019.  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  Primary Care Provider is Alan Blitz, MD, [facility] is: [facility]   Transportation by daughter [name]  Plan: Patient is currently for home with home health care. Spoke with daughter [name] at the hospital phone and she states he has what he needs already set up for home no other needs.  No needs assessed at this time.  For questions contact:   Alan Brood, RN BSN [facility] Liaison Toll free office [phone]  Fax number: [phone] [email or web address]

## 2019-09-20 NOTE — TOC Transition Note (Signed)
Transition of Care [facility]) - CM/SW Discharge Note   Patient Details  Name: [name] MRN: [id number] Date of Birth: Dec 28, 1945  [facility] CM/SW Contact:  Pollie Friar, RN Phone Number: 09/20/2019, 10:47 AM   Clinical Narrative:    Pt discharging home today with [facility] services through [facility]. Cassie with [facility] accepted the referral and is aware of d/c home today.  No DME needs.  Daughter and spouse to provide support and supervision at home. Daughter will provide transport home.  Final next level of care: Home w Home Health Services Barriers to Discharge: No Barriers Identified   Patient Goals and CMS Choice   CMS Medicare.gov Compare Post Acute Care list provided to:: Patient Represenative (must comment) Choice offered to / list presented to : Patient, Adult Children  Discharge Placement                       Discharge Plan and Services   Discharge Planning Services: CM Consult Post Acute Care Choice: Home Health                    HH Arranged: PT, OT [facility] Agency: [facility] Date [place]: 09/19/19 Time HH Agency Contacted: 1700 Representative spoke with at [place]: Cassie  Social Determinants of Health ([place]) Interventions     Readmission Risk Interventions Readmission Risk Prevention Plan 07/26/2019  Transportation Screening Complete  Medication Review Press photographer) Complete  PCP or Specialist appointment within 3-5 days of discharge Complete  HRI or [place] Not Complete  HRI or Home Care Consult Pt Refusal Comments not needed  SW Recovery Care/Counseling Consult Complete  Palliative Care Screening Not [place] Not Applicable  Some recent data might be hidden

## 2019-09-20 NOTE — Procedures (Signed)
Ultrasound-guided  therapeutic paracentesis performed yielding 2.8 liters of straw colored fluid. No immediate complications. EBL is none.

## 2019-09-20 NOTE — Progress Notes (Signed)
  NEUROSURGERY PROGRESS NOTE   MRA negative for vascular malformation. SAH appears traumatic. Cleared for d/c from NS perspective. F/U outpatient in several weeks for clinical recheck. Please call for any concerns.  Ferne Reus, PA-C [facility] [employer]

## 2019-09-20 NOTE — Progress Notes (Signed)
PT Cancellation Note  Patient Details Name: [name] MRN: [id number] DOB: 1946/02/12   Cancelled Treatment:    Reason Eval/Treat Not Completed: Patient at procedure or test/unavailable.  Per RN he is in IR for paracentesis. PT will check back later as time allows, but pt is due to d/c with [facility] follow up and family support later today.  Thanks, Verdene Lennert, PT, DPT  Acute Rehabilitation [phone] pager [phone] office  @ [facility]: [phone]     Harvie Heck 09/20/2019, 3:06 PM

## 2019-09-26 ENCOUNTER — Telehealth: Payer: Self-pay | Admitting: Gastroenterology

## 2019-09-26 ENCOUNTER — Other Ambulatory Visit: Payer: Self-pay | Admitting: *Deleted

## 2019-09-26 DIAGNOSIS — R188 Other ascites: Secondary | ICD-10-CM

## 2019-09-26 NOTE — Telephone Encounter (Signed)
Looking back in chart looks like patient has had few PARA's done last month. Please advise if patient needs standing order thanks

## 2019-09-26 NOTE — Telephone Encounter (Signed)
Pt's wife called to schedule a para. She said he needed fluid drawn off. Please advise. Does he have a standing order? [phone]

## 2019-09-26 NOTE — Telephone Encounter (Signed)
[facility] radiology called to say they can do the para on patient tomorrow and to arrive at 2pm.

## 2019-09-26 NOTE — Telephone Encounter (Signed)
See prior note. Patient calling to r/s this appt

## 2019-09-26 NOTE — Telephone Encounter (Signed)
PARA scheduled for 3/9 at 1:00pm.   Called pt. Reports he has an appt in [place]. He has been provided CS # to r/s. Also pt is aware he has standing order on file. He can call [place] # to schedule PARA when needed. Order faxed to C/S.

## 2019-09-26 NOTE — Telephone Encounter (Signed)
Sounds like may need standing order. Please do albumin per protocol. Cell count and culture with paras. Mardelle Matte.

## 2019-09-26 NOTE — Telephone Encounter (Signed)
No, pt does not have standing orders for para

## 2019-09-27 ENCOUNTER — Ambulatory Visit (HOSPITAL_COMMUNITY)
Admission: RE | Admit: 2019-09-27 | Discharge: 2019-09-27 | Disposition: A | Discharge status: Home / Self Care | Payer: Medicare Other | Source: Ambulatory Visit | Attending: Gastroenterology | Admitting: Gastroenterology

## 2019-09-27 ENCOUNTER — Other Ambulatory Visit: Payer: Self-pay

## 2019-09-27 ENCOUNTER — Encounter (HOSPITAL_COMMUNITY): Payer: Self-pay

## 2019-09-27 ENCOUNTER — Ambulatory Visit (HOSPITAL_COMMUNITY): Admission: RE | Admit: 2019-09-27 | Payer: Medicare Other | Source: Ambulatory Visit

## 2019-09-27 DIAGNOSIS — R188 Other ascites: Secondary | ICD-10-CM | POA: Diagnosis not present

## 2019-09-27 DIAGNOSIS — N186 End stage renal disease: Secondary | ICD-10-CM | POA: Diagnosis not present

## 2019-09-27 DIAGNOSIS — D469 Myelodysplastic syndrome, unspecified: Secondary | ICD-10-CM | POA: Diagnosis not present

## 2019-09-27 DIAGNOSIS — Z992 Dependence on renal dialysis: Secondary | ICD-10-CM | POA: Diagnosis not present

## 2019-09-27 DIAGNOSIS — D471 Chronic myeloproliferative disease: Secondary | ICD-10-CM | POA: Diagnosis not present

## 2019-09-27 DIAGNOSIS — F418 Other specified anxiety disorders: Secondary | ICD-10-CM | POA: Diagnosis not present

## 2019-09-27 DIAGNOSIS — I10 Essential (primary) hypertension: Secondary | ICD-10-CM | POA: Diagnosis not present

## 2019-09-27 DIAGNOSIS — I259 Chronic ischemic heart disease, unspecified: Secondary | ICD-10-CM | POA: Diagnosis not present

## 2019-09-27 DIAGNOSIS — R140 Abdominal distension (gaseous): Secondary | ICD-10-CM | POA: Diagnosis not present

## 2019-09-27 DIAGNOSIS — I110 Hypertensive heart disease with heart failure: Secondary | ICD-10-CM | POA: Diagnosis not present

## 2019-09-27 DIAGNOSIS — Z8616 Personal history of COVID-19: Secondary | ICD-10-CM | POA: Diagnosis not present

## 2019-09-27 DIAGNOSIS — Z87442 Personal history of urinary calculi: Secondary | ICD-10-CM | POA: Diagnosis not present

## 2019-09-27 DIAGNOSIS — Z79899 Other long term (current) drug therapy: Secondary | ICD-10-CM | POA: Diagnosis not present

## 2019-09-27 LAB — GRAM STAIN: Gram Stain: NONE SEEN

## 2019-09-27 LAB — BODY FLUID CELL COUNT WITH DIFFERENTIAL
Eos, Fluid: 0 %
Lymphs, Fluid: 62 %
Monocyte-Macrophage-Serous Fluid: 14 % — ABNORMAL LOW (ref 50–90)
Neutrophil Count, Fluid: 24 % (ref 0–25)
Other Cells, Fluid: 1 %
Total Nucleated Cell Count, Fluid: 62 cu mm (ref 0–1000)

## 2019-09-27 NOTE — Telephone Encounter (Signed)
Noted  

## 2019-09-27 NOTE — Progress Notes (Addendum)
Paracentesis complete no signs of distress.  

## 2019-09-27 NOTE — Telephone Encounter (Signed)
REVIEWED-NO ADDITIONAL RECOMMENDATIONS. 

## 2019-09-28 NOTE — Procedures (Signed)
INDICATION: Ascites  EXAM: ULTRASOUND GUIDED LEFT PARACENTESIS  GRIP-IR: Category: Fluids  Subcategory: Paracentesis  Follow-Up: None  MEDICATIONS: None.  COMPLICATIONS: None immediate.  PROCEDURE: Informed written consent was obtained from the patient after a discussion of the risks, benefits and alternatives to treatment. A timeout was performed prior to the initiation of the procedure.  Initial ultrasound scanning demonstrates a large amount of ascites within the left lower abdominal quadrant. The left lower abdomen was prepped and draped in the usual sterile fashion. 1% lidocaine  was used for local anesthesia.   Following this, a Yueh catheter was introduced. An ultrasound image was saved for documentation purposes. The paracentesis was performed. The catheter was removed and a dressing was applied. The patient tolerated the procedure well without immediate post procedural complication.   FINDINGS: A total of approximately 4.3 L of straw-colored fluid was removed. Samples were sent to the laboratory as requested by the clinical team.  IMPRESSION:  Successful ultrasound-guided paracentesis yielding 4.3 liters of peritoneal fluid.

## 2019-09-30 ENCOUNTER — Emergency Department (HOSPITAL_COMMUNITY)
Admission: EM | Admit: 2019-09-30 | Discharge: 2019-09-30 | Discharge status: Against Medical Advice | Payer: Medicare Other | Attending: Emergency Medicine | Admitting: Emergency Medicine

## 2019-09-30 ENCOUNTER — Other Ambulatory Visit: Payer: Self-pay

## 2019-09-30 ENCOUNTER — Encounter (HOSPITAL_COMMUNITY): Payer: Self-pay

## 2019-09-30 DIAGNOSIS — R531 Weakness: Secondary | ICD-10-CM | POA: Diagnosis present

## 2019-09-30 DIAGNOSIS — E1122 Type 2 diabetes mellitus with diabetic chronic kidney disease: Secondary | ICD-10-CM | POA: Insufficient documentation

## 2019-09-30 DIAGNOSIS — Z992 Dependence on renal dialysis: Secondary | ICD-10-CM | POA: Insufficient documentation

## 2019-09-30 DIAGNOSIS — N184 Chronic kidney disease, stage 4 (severe): Secondary | ICD-10-CM

## 2019-09-30 DIAGNOSIS — I509 Heart failure, unspecified: Secondary | ICD-10-CM | POA: Diagnosis not present

## 2019-09-30 DIAGNOSIS — N186 End stage renal disease: Secondary | ICD-10-CM | POA: Insufficient documentation

## 2019-09-30 DIAGNOSIS — Z7984 Long term (current) use of oral hypoglycemic drugs: Secondary | ICD-10-CM | POA: Diagnosis not present

## 2019-09-30 DIAGNOSIS — I132 Hypertensive heart and chronic kidney disease with heart failure and with stage 5 chronic kidney disease, or end stage renal disease: Secondary | ICD-10-CM | POA: Insufficient documentation

## 2019-09-30 DIAGNOSIS — I129 Hypertensive chronic kidney disease with stage 1 through stage 4 chronic kidney disease, or unspecified chronic kidney disease: Secondary | ICD-10-CM | POA: Diagnosis not present

## 2019-09-30 DIAGNOSIS — N189 Chronic kidney disease, unspecified: Secondary | ICD-10-CM | POA: Diagnosis not present

## 2019-09-30 LAB — COMPREHENSIVE METABOLIC PANEL
ALT: 28 U/L (ref 0–44)
AST: 32 U/L (ref 15–41)
Albumin: 3 g/dL — ABNORMAL LOW (ref 3.5–5.0)
Alkaline Phosphatase: 149 U/L — ABNORMAL HIGH (ref 38–126)
Anion gap: 11 (ref 5–15)
BUN: 66 mg/dL — ABNORMAL HIGH (ref 8–23)
CO2: 20 mmol/L — ABNORMAL LOW (ref 22–32)
Calcium: 8 mg/dL — ABNORMAL LOW (ref 8.9–10.3)
Chloride: 105 mmol/L (ref 98–111)
Creatinine, Ser: 5.24 mg/dL — ABNORMAL HIGH (ref 0.61–1.24)
GFR calc Af Amer: 12 mL/min — ABNORMAL LOW (ref >60)
GFR calc non Af Amer: 10 mL/min — ABNORMAL LOW (ref >60)
Glucose, Bld: 101 mg/dL — ABNORMAL HIGH (ref 70–99)
Potassium: 4.6 mmol/L (ref 3.5–5.1)
Sodium: 136 mmol/L (ref 135–145)
Total Bilirubin: 0.8 mg/dL (ref 0.3–1.2)
Total Protein: 5.6 g/dL — ABNORMAL LOW (ref 6.5–8.1)

## 2019-09-30 LAB — CBC WITH DIFFERENTIAL/PLATELET
Abs Immature Granulocytes: 3.56 10*3/uL — ABNORMAL HIGH (ref 0.00–0.07)
Basophils Absolute: 0.4 10*3/uL — ABNORMAL HIGH (ref 0.0–0.1)
Basophils Relative: 3 %
Eosinophils Absolute: 0 10*3/uL (ref 0.0–0.5)
Eosinophils Relative: 0 %
HCT: 30 % — ABNORMAL LOW (ref 39.0–52.0)
Hemoglobin: 9.6 g/dL — ABNORMAL LOW (ref 13.0–17.0)
Immature Granulocytes: 29 %
Lymphocytes Relative: 13 %
Lymphs Abs: 1.5 10*3/uL (ref 0.7–4.0)
MCH: 31.2 pg (ref 26.0–34.0)
MCHC: 32 g/dL (ref 30.0–36.0)
MCV: 97.4 fL (ref 80.0–100.0)
Monocytes Absolute: 1.8 10*3/uL — ABNORMAL HIGH (ref 0.1–1.0)
Monocytes Relative: 15 %
Neutro Abs: 5 10*3/uL (ref 1.7–7.7)
Neutrophils Relative %: 40 %
Platelets: 116 10*3/uL — ABNORMAL LOW (ref 150–400)
RBC: 3.08 MIL/uL — ABNORMAL LOW (ref 4.22–5.81)
RDW: 21.5 % — ABNORMAL HIGH (ref 11.5–15.5)
WBC: 12.2 10*3/uL — ABNORMAL HIGH (ref 4.0–10.5)
nRBC: 0.7 % — ABNORMAL HIGH (ref 0.0–0.2)

## 2019-09-30 NOTE — Discharge Instructions (Signed)
Follow-up Monday for dialysis as planned

## 2019-09-30 NOTE — ED Triage Notes (Signed)
Pt is here for a physical and to be cleared to receive dialysis. He has missed 4 dialysis treatments and has a treatment scheduled for Monday. Per Dialysis, he needs to have his vitals checked, a CBC, CMP, and to check for swelling in limbs. He has fluid on his abdomen, but already has an appointment scheduled for a paracentesis outpatient.

## 2019-09-30 NOTE — ED Notes (Signed)
CBC CMP  Pt hasn't had dialysis since February 22nd.   Ronny Bacon

## 2019-09-30 NOTE — ED Notes (Signed)
Patient walked out prior to receiving his discharge summary papers, AMA

## 2019-09-30 NOTE — ED Provider Notes (Signed)
[facility] EMERGENCY DEPARTMENT Provider Note   CSN: [id number] Arrival date & time: 09/30/19  1660     History Chief Complaint  Patient presents with  . Follow-up    [name] is a 74 y.o. male.  Patient complains of general weakness.  He is scheduled to get his dialysis on Monday but he has not had dialysis and a number of weeks.  The history is provided by the patient. No language interpreter was used.  Weakness Severity:  Mild Onset quality:  Gradual Timing:  Constant Progression:  Waxing and waning Chronicity:  Recurrent Context: not alcohol use   Relieved by:  Nothing Worsened by:  Nothing Associated symptoms: no abdominal pain, no chest pain, no cough, no diarrhea, no frequency, no headaches and no seizures        Past Medical History:  Diagnosis Date  . Anxiety   . Arthritis   . BPH (benign prostatic hyperplasia)   . CAD (coronary artery disease)    DES to circumflex 07/2016, moderate residual LAD and RCA, small 90% OM3 - managed medically  . CHF (congestive heart failure) ([place])   . Chronic lower back pain   . Depression   . ESRD (end stage renal disease) ([place])    Hemo MWF [facility]  . Essential hypertension   . Gastritis   . GERD (gastroesophageal reflux disease)   . History of kidney stones   . Leukemia ([place])   . Leukocytosis   . 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 Pleural effusion    had Thorancentesis at [facility]  . Pneumonia    last time 08/2018- Aspiration Pneumonia - unintentional opiate overdose  . Renal insufficiency   . Spleen enlarged   . Thrombocythemia ([place])   . Type II diabetes mellitus [facility])     Patient Active Problem List   Diagnosis Date Noted  . Thrombocytopenia ([place]) 09/15/2019  . Subarachnoid hemorrhage ([place]) 09/14/2019  . Ascites 09/01/2019  . Abnormal CT of the abdomen 09/01/2019  . Acute respiratory disease due to  COVID-19 virus 07/23/2019  . Intractable pain 04/19/2019  . Elevated d-dimer 04/19/2019  . RUQ abdominal pain 11/18/2017  . Acute CHF (congestive heart failure) ([employer]) 09/15/2016  . Essential hypertension 08/14/2016  . Headache 08/14/2016  . Hyperlipidemia LDL goal <70 08/14/2016  . Abnormal nuclear stress test   . 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 Acute respiratory failure with hypoxia ([place]) 04/27/2016  . Hypoxia   . Hematoma 04/25/2016  . Mass of chest wall, left   . Abnormal partial thromboplastin time (PTT)   . 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 Coronary artery disease due to lipid rich plaque 04/15/2016  . Leukocytosis 04/15/2016  . Diabetes mellitus ([place]) 04/15/2016  . Pleuritic pain 04/15/2016  . ESRD on hemodialysis ([place]) 04/15/2016  . Left shoulder pain 04/15/2016  . History of adenomatous polyp of colon 01/21/2013  . GERD (gastroesophageal reflux disease) 01/21/2013    Past Surgical History:  Procedure Laterality Date  . AV FISTULA PLACEMENT Left 04/14/2019   Procedure: ARTERIOVENOUS (AV) FISTULA CREATION LEFT ARM;  Surgeon: Rosetta Posner, MD;  Location: [facility];  Service: Vascular;  Laterality: Left;  . BIOPSY  12/08/2017   Procedure: BIOPSY;  Surgeon: Danie Binder, MD;  [facility];  Service: Endoscopy;;  gastric  . BONE MARROW BIOPSY     x 3 times  . CARDIAC  CATHETERIZATION N/A 08/12/2016   Procedure: Left Heart Cath and Coronary Angiography;  Surgeon: Jettie Booze, MD;  Location: [place] CV LAB;  Service: Cardiovascular;  Laterality: N/A;  . CARDIAC CATHETERIZATION N/A 08/12/2016   Procedure: Coronary Stent Intervention;  Surgeon: Jettie Booze, MD;  Location: [place] CV LAB;  Service: Cardiovascular;  Laterality: N/A;  . CARDIAC CATHETERIZATION  2000s X 1  . COLONOSCOPY WITH PROPOFOL N/A 02/01/2013   SLF: 1. 23 colon polyps removed. 2 retrieved. 2. Mild diverticulosis in teh sigmoid colon 3. Small internal hemorrhoids 4.  The colon is redundant   . COLONOSCOPY WITH PROPOFOL N/A 12/08/2017   Procedure: COLONOSCOPY WITH PROPOFOL;  Surgeon: Danie Binder, MD;  [facility];  Service: Endoscopy;  Laterality: N/A;  1:45pm  . CYSTOSCOPY W/ STONE MANIPULATION    . ESOPHAGOGASTRODUODENOSCOPY (EGD) WITH PROPOFOL N/A 02/01/2013   SLF: 1. Moderate erosive gastritis  . ESOPHAGOGASTRODUODENOSCOPY (EGD) WITH PROPOFOL N/A 12/08/2017   Procedure: ESOPHAGOGASTRODUODENOSCOPY (EGD) WITH PROPOFOL;  Surgeon: Danie Binder, MD;  [facility];  Service: Endoscopy;  Laterality: N/A;  . IR PARACENTESIS  09/20/2019  . IR REMOVAL TUN CV CATH W/O FL  09/06/2019  . [place]  . POLYPECTOMY N/A 02/01/2013   Procedure: POLYPECTOMY;  Surgeon: Danie Binder, MD;  Location: [facility];  Service: Endoscopy;  Laterality: N/A;       Family History  Problem Relation Age of Onset  . Heart attack Mother   . CVA Mother   . Heart attack Father   . Colon cancer Neg Hx     Social History   Tobacco Use  . Smoking status: Former Smoker    Packs/day: 1.00    Years: 20.00    Pack years: 20.00    Types: Cigars    Start date: 06/25/1975    Quit date: 08/04/2013    Years since quitting: 6.1  . Smokeless tobacco: Never Used  Substance Use Topics  . Alcohol use: Not Currently    Comment: Remote history of ETOH abuse, "when I was young and dumb"   . Drug use: No    Home Medications Prior to Admission medications   Medication Sig Start Date End Date Taking? Authorizing Provider  acetaminophen (TYLENOL) 500 MG tablet Take 1,000 mg by mouth 2 (two) times daily as needed for mild pain or moderate pain.     [provider]  DULoxetine (CYMBALTA) 60 MG capsule Take 1 capsule (60 mg total) by mouth daily. 04/21/19   Roxan Hockey, MD  HYDROcodone-acetaminophen (NORCO/VICODIN) 5-325 MG tablet Take 1 tablet by mouth 3 (three) times daily.    [provider]  labetalol (NORMODYNE) 100 MG tablet Take  100 mg by mouth 2 (two) times daily.    [provider]  nitroGLYCERIN (NITROSTAT) 0.4 MG SL tablet Place 1 tablet (0.4 mg total) under the tongue every 5 (five) minutes x 3 doses as needed for chest pain (if no relief after 3rd dose, proceed to the ED for an evaluation or call 911). 10/04/18   Arnoldo Lenis, MD  pravastatin (PRAVACHOL) 40 MG tablet Take 40 mg by mouth daily. 07/01/19   [provider]  ruxolitinib phosphate (JAKAFI) 5 MG tablet Take 5 mg by mouth daily.    [provider]  sevelamer carbonate (RENVELA) 800 MG tablet Take 800 mg by mouth 3 (three) times daily with meals. Takes 3 tablets by mouth 3 times daily with meals.  [provider]  traZODone (DESYREL) 50 MG tablet Take 1 tablet (50 mg total) by mouth at bedtime as needed for sleep. 04/21/19   Roxan Hockey, MD    Allergies    Hydrocodone  Review of Systems   Review of Systems  Constitutional: Negative for appetite change and fatigue.  HENT: Negative for congestion, ear discharge and sinus pressure.   Eyes: Negative for discharge.  Respiratory: Negative for cough.   Cardiovascular: Negative for chest pain.  Gastrointestinal: Negative for abdominal pain and diarrhea.  Genitourinary: Negative for frequency and hematuria.  Musculoskeletal: Negative for back pain.  Skin: Negative for rash.  Neurological: Positive for weakness. Negative for seizures and headaches.  Psychiatric/Behavioral: Negative for hallucinations.    Physical Exam Updated Vital Signs BP (!) 156/69 (BP Location: Right Arm)   Pulse 65   Temp 97.8 F (36.6 C) (Oral)   Resp 15   Ht 6' (1.829 m)   Wt 75.3 kg   SpO2 100%   BMI 22.51 kg/m   Physical Exam Vitals and nursing note reviewed.  Constitutional:      Appearance: He is well-developed.  HENT:     Head: Normocephalic.     Nose: Nose normal.  Eyes:     General: No scleral icterus.    Conjunctiva/sclera: Conjunctivae normal.  Neck:      Thyroid: No thyromegaly.  Cardiovascular:     Rate and Rhythm: Normal rate and regular rhythm.     Heart sounds: No murmur. No friction rub. No gallop.   Pulmonary:     Breath sounds: No stridor. No wheezing or rales.  Chest:     Chest wall: No tenderness.  Abdominal:     General: There is no distension.     Tenderness: There is no abdominal tenderness. There is no rebound.  Musculoskeletal:        General: Normal range of motion.     Cervical back: Neck supple.  Lymphadenopathy:     Cervical: No cervical adenopathy.  Skin:    Findings: No erythema or rash.  Neurological:     Mental Status: He is alert and oriented to person, place, and time.     Motor: No abnormal muscle tone.     Coordination: Coordination normal.  Psychiatric:        Behavior: Behavior normal.     ED Results / Procedures / Treatments   Labs (all labs ordered are listed, but only abnormal results are displayed) Labs Reviewed  CBC WITH DIFFERENTIAL/PLATELET - Abnormal; Notable for the following components:      Result Value   WBC 12.2 (*)    RBC 3.08 (*)    Hemoglobin 9.6 (*)    HCT 30.0 (*)    RDW 21.5 (*)    Platelets 116 (*)    nRBC 0.7 (*)    Monocytes Absolute 1.8 (*)    Basophils Absolute 0.4 (*)    Abs Immature Granulocytes 3.56 (*)    All other components within normal limits  COMPREHENSIVE METABOLIC PANEL - Abnormal; Notable for the following components:   CO2 20 (*)    Glucose, Bld 101 (*)    BUN 66 (*)    Creatinine, Ser 5.24 (*)    Calcium 8.0 (*)    Total Protein 5.6 (*)    Albumin 3.0 (*)    Alkaline Phosphatase 149 (*)    GFR calc non Af Amer 10 (*)    GFR calc Af Amer 12 (*)  All other components within normal limits    EKG None  Radiology No results found.  Procedures Procedures (including critical care time)  Medications Ordered in ED Medications - No data to display  ED Course  I have reviewed the triage vital signs and the nursing notes.  Pertinent labs &  imaging results that were available during my care of the patient were reviewed by me and considered in my medical decision making (see chart for details).    MDM Rules/Calculators/A&P                     Labs show renal failure but no need for acute dialysis Patient with renal failure that stable.  He will get dialysis on Monday Final Clinical Impression(s) / ED Diagnoses Final diagnoses:  Chronic renal failure, stage 4 (severe) ([place])    Rx / DC Orders ED Discharge Orders    None       Milton Ferguson, MD 09/30/19 1127

## 2019-10-02 LAB — CULTURE, BODY FLUID W GRAM STAIN -BOTTLE: Culture: NO GROWTH

## 2019-10-03 ENCOUNTER — Telehealth: Payer: Self-pay | Admitting: Gastroenterology

## 2019-10-03 ENCOUNTER — Ambulatory Visit (HOSPITAL_COMMUNITY)
Admission: RE | Admit: 2019-10-03 | Discharge: 2019-10-03 | Disposition: A | Discharge status: Home / Self Care | Payer: Medicare Other | Source: Ambulatory Visit | Attending: Gastroenterology | Admitting: Gastroenterology

## 2019-10-03 ENCOUNTER — Other Ambulatory Visit: Payer: Self-pay

## 2019-10-03 ENCOUNTER — Encounter (HOSPITAL_COMMUNITY): Payer: Self-pay

## 2019-10-03 DIAGNOSIS — E559 Vitamin D deficiency, unspecified: Secondary | ICD-10-CM | POA: Diagnosis not present

## 2019-10-03 DIAGNOSIS — R188 Other ascites: Secondary | ICD-10-CM | POA: Diagnosis not present

## 2019-10-03 DIAGNOSIS — N186 End stage renal disease: Secondary | ICD-10-CM | POA: Diagnosis not present

## 2019-10-03 DIAGNOSIS — D631 Anemia in chronic kidney disease: Secondary | ICD-10-CM | POA: Diagnosis not present

## 2019-10-03 DIAGNOSIS — D509 Iron deficiency anemia, unspecified: Secondary | ICD-10-CM | POA: Diagnosis not present

## 2019-10-03 DIAGNOSIS — Z992 Dependence on renal dialysis: Secondary | ICD-10-CM | POA: Diagnosis not present

## 2019-10-03 LAB — BODY FLUID CELL COUNT WITH DIFFERENTIAL
Eos, Fluid: 0 %
Lymphs, Fluid: 64 %
Monocyte-Macrophage-Serous Fluid: 18 % — ABNORMAL LOW (ref 50–90)
Neutrophil Count, Fluid: 18 % (ref 0–25)
Total Nucleated Cell Count, Fluid: 35 cu mm (ref 0–1000)

## 2019-10-03 LAB — GRAM STAIN: Gram Stain: NONE SEEN

## 2019-10-03 NOTE — Procedures (Signed)
PreOperative Dx: ESRD, ascites Postoperative Dx: ESRD, ascites Procedure:   US guided paracentesis Radiologist:  Annalyn Blecher Anesthesia:  10 ml of1% lidocaine Specimen:  4.8 L of yellow ascitic fluid EBL:   < 1 ml Complications: None  

## 2019-10-03 NOTE — Telephone Encounter (Signed)
[facility] scheduling called to verify if patient needed a pre cert on his para. I told her per MB that no pre cert was needed.

## 2019-10-03 NOTE — Telephone Encounter (Signed)
No PA needed for para.

## 2019-10-03 NOTE — Progress Notes (Signed)
Paracentesis complete no signs of distress.  

## 2019-10-04 ENCOUNTER — Ambulatory Visit (HOSPITAL_COMMUNITY): Admit: 2019-10-04 | Payer: Medicare Other

## 2019-10-04 DIAGNOSIS — K7460 Unspecified cirrhosis of liver: Secondary | ICD-10-CM | POA: Diagnosis not present

## 2019-10-04 DIAGNOSIS — D692 Other nonthrombocytopenic purpura: Secondary | ICD-10-CM | POA: Diagnosis not present

## 2019-10-04 DIAGNOSIS — L8990 Pressure ulcer of unspecified site, unspecified stage: Secondary | ICD-10-CM | POA: Diagnosis not present

## 2019-10-04 DIAGNOSIS — E1165 Type 2 diabetes mellitus with hyperglycemia: Secondary | ICD-10-CM | POA: Diagnosis not present

## 2019-10-04 DIAGNOSIS — I10 Essential (primary) hypertension: Secondary | ICD-10-CM | POA: Diagnosis not present

## 2019-10-04 DIAGNOSIS — Z299 Encounter for prophylactic measures, unspecified: Secondary | ICD-10-CM | POA: Diagnosis not present

## 2019-10-04 LAB — PATHOLOGIST SMEAR REVIEW

## 2019-10-05 DIAGNOSIS — D631 Anemia in chronic kidney disease: Secondary | ICD-10-CM | POA: Diagnosis not present

## 2019-10-05 DIAGNOSIS — D509 Iron deficiency anemia, unspecified: Secondary | ICD-10-CM | POA: Diagnosis not present

## 2019-10-05 DIAGNOSIS — Z992 Dependence on renal dialysis: Secondary | ICD-10-CM | POA: Diagnosis not present

## 2019-10-05 DIAGNOSIS — I609 Nontraumatic subarachnoid hemorrhage, unspecified: Secondary | ICD-10-CM | POA: Diagnosis not present

## 2019-10-05 DIAGNOSIS — E559 Vitamin D deficiency, unspecified: Secondary | ICD-10-CM | POA: Diagnosis not present

## 2019-10-05 DIAGNOSIS — N186 End stage renal disease: Secondary | ICD-10-CM | POA: Diagnosis not present

## 2019-10-07 DIAGNOSIS — D631 Anemia in chronic kidney disease: Secondary | ICD-10-CM | POA: Diagnosis not present

## 2019-10-07 DIAGNOSIS — D509 Iron deficiency anemia, unspecified: Secondary | ICD-10-CM | POA: Diagnosis not present

## 2019-10-07 DIAGNOSIS — Z992 Dependence on renal dialysis: Secondary | ICD-10-CM | POA: Diagnosis not present

## 2019-10-07 DIAGNOSIS — N186 End stage renal disease: Secondary | ICD-10-CM | POA: Diagnosis not present

## 2019-10-07 DIAGNOSIS — E559 Vitamin D deficiency, unspecified: Secondary | ICD-10-CM | POA: Diagnosis not present

## 2019-10-08 LAB — CULTURE, BODY FLUID W GRAM STAIN -BOTTLE: Culture: NO GROWTH

## 2019-10-11 ENCOUNTER — Encounter (HOSPITAL_COMMUNITY): Payer: Self-pay

## 2019-10-11 ENCOUNTER — Other Ambulatory Visit: Payer: Self-pay

## 2019-10-11 ENCOUNTER — Ambulatory Visit (HOSPITAL_COMMUNITY)
Admission: RE | Admit: 2019-10-11 | Discharge: 2019-10-11 | Disposition: A | Discharge status: Home / Self Care | Payer: Medicare Other | Source: Ambulatory Visit | Attending: Gastroenterology | Admitting: Gastroenterology

## 2019-10-11 DIAGNOSIS — R188 Other ascites: Secondary | ICD-10-CM | POA: Insufficient documentation

## 2019-10-11 LAB — BODY FLUID CELL COUNT WITH DIFFERENTIAL
Eos, Fluid: 0 %
Lymphs, Fluid: 64 %
Monocyte-Macrophage-Serous Fluid: 12 % — ABNORMAL LOW (ref 50–90)
Neutrophil Count, Fluid: 24 % (ref 0–25)
Other Cells, Fluid: 1 %
Total Nucleated Cell Count, Fluid: 36 cu mm (ref 0–1000)

## 2019-10-11 LAB — GRAM STAIN: Gram Stain: NONE SEEN

## 2019-10-11 LAB — PATHOLOGIST SMEAR REVIEW

## 2019-10-11 NOTE — Progress Notes (Addendum)
Paracentesis complete no signs of distress.  

## 2019-10-11 NOTE — Progress Notes (Signed)
PATIENT SCHEDULED  °

## 2019-10-11 NOTE — Procedures (Signed)
PreOperative Dx: Ascites Postoperative Dx: Ascites Procedure:   US guided paracentesis Radiologist:  Thornton Papas Anesthesia:  10 ml of1% lidocaine Specimen:  4.3 L of pink cloudy ascitic fluid EBL:   < 1 ml Complications: None

## 2019-10-14 ENCOUNTER — Emergency Department (HOSPITAL_COMMUNITY)
Admission: EM | Admit: 2019-10-14 | Discharge: 2019-10-14 | Disposition: A | Discharge status: Home / Self Care | Payer: Medicare Other | Attending: Emergency Medicine | Admitting: Emergency Medicine

## 2019-10-14 ENCOUNTER — Other Ambulatory Visit: Payer: Self-pay

## 2019-10-14 ENCOUNTER — Emergency Department (HOSPITAL_COMMUNITY): Payer: Medicare Other

## 2019-10-14 ENCOUNTER — Encounter (HOSPITAL_COMMUNITY): Payer: Self-pay | Admitting: Emergency Medicine

## 2019-10-14 DIAGNOSIS — R188 Other ascites: Secondary | ICD-10-CM | POA: Diagnosis not present

## 2019-10-14 DIAGNOSIS — Z79899 Other long term (current) drug therapy: Secondary | ICD-10-CM | POA: Diagnosis not present

## 2019-10-14 DIAGNOSIS — I509 Heart failure, unspecified: Secondary | ICD-10-CM | POA: Diagnosis not present

## 2019-10-14 DIAGNOSIS — E1122 Type 2 diabetes mellitus with diabetic chronic kidney disease: Secondary | ICD-10-CM | POA: Diagnosis not present

## 2019-10-14 DIAGNOSIS — I2510 Atherosclerotic heart disease of native coronary artery without angina pectoris: Secondary | ICD-10-CM | POA: Diagnosis not present

## 2019-10-14 DIAGNOSIS — R609 Edema, unspecified: Secondary | ICD-10-CM | POA: Diagnosis not present

## 2019-10-14 DIAGNOSIS — N186 End stage renal disease: Secondary | ICD-10-CM | POA: Diagnosis not present

## 2019-10-14 DIAGNOSIS — Z87891 Personal history of nicotine dependence: Secondary | ICD-10-CM | POA: Insufficient documentation

## 2019-10-14 DIAGNOSIS — I132 Hypertensive heart and chronic kidney disease with heart failure and with stage 5 chronic kidney disease, or end stage renal disease: Secondary | ICD-10-CM | POA: Insufficient documentation

## 2019-10-14 DIAGNOSIS — Z992 Dependence on renal dialysis: Secondary | ICD-10-CM | POA: Diagnosis not present

## 2019-10-14 DIAGNOSIS — R109 Unspecified abdominal pain: Secondary | ICD-10-CM

## 2019-10-14 DIAGNOSIS — R531 Weakness: Secondary | ICD-10-CM | POA: Diagnosis not present

## 2019-10-14 DIAGNOSIS — R079 Chest pain, unspecified: Secondary | ICD-10-CM | POA: Diagnosis not present

## 2019-10-14 DIAGNOSIS — R1010 Upper abdominal pain, unspecified: Secondary | ICD-10-CM | POA: Diagnosis present

## 2019-10-14 DIAGNOSIS — I120 Hypertensive chronic kidney disease with stage 5 chronic kidney disease or end stage renal disease: Secondary | ICD-10-CM | POA: Diagnosis not present

## 2019-10-14 DIAGNOSIS — R0789 Other chest pain: Secondary | ICD-10-CM | POA: Diagnosis not present

## 2019-10-14 DIAGNOSIS — R1084 Generalized abdominal pain: Secondary | ICD-10-CM

## 2019-10-14 LAB — ALBUMIN, PLEURAL OR PERITONEAL FLUID: Albumin, Fluid: <1 g/dL

## 2019-10-14 LAB — CBC WITH DIFFERENTIAL/PLATELET
Abs Immature Granulocytes: 4.15 10*3/uL — ABNORMAL HIGH (ref 0.00–0.07)
Basophils Absolute: 0.3 10*3/uL — ABNORMAL HIGH (ref 0.0–0.1)
Basophils Relative: 2 %
Eosinophils Absolute: 0 10*3/uL (ref 0.0–0.5)
Eosinophils Relative: 0 %
HCT: 25.9 % — ABNORMAL LOW (ref 39.0–52.0)
Hemoglobin: 8.6 g/dL — ABNORMAL LOW (ref 13.0–17.0)
Immature Granulocytes: 32 %
Lymphocytes Relative: 11 %
Lymphs Abs: 1.4 10*3/uL (ref 0.7–4.0)
MCH: 31.5 pg (ref 26.0–34.0)
MCHC: 33.2 g/dL (ref 30.0–36.0)
MCV: 94.9 fL (ref 80.0–100.0)
Monocytes Absolute: 1.6 10*3/uL — ABNORMAL HIGH (ref 0.1–1.0)
Monocytes Relative: 13 %
Neutro Abs: 5.3 10*3/uL (ref 1.7–7.7)
Neutrophils Relative %: 42 %
Platelets: 142 10*3/uL — ABNORMAL LOW (ref 150–400)
RBC: 2.73 MIL/uL — ABNORMAL LOW (ref 4.22–5.81)
RDW: 20.5 % — ABNORMAL HIGH (ref 11.5–15.5)
WBC: 12.8 10*3/uL — ABNORMAL HIGH (ref 4.0–10.5)
nRBC: 0.7 % — ABNORMAL HIGH (ref 0.0–0.2)

## 2019-10-14 LAB — GLUCOSE, PLEURAL OR PERITONEAL FLUID: Glucose, Fluid: 103 mg/dL

## 2019-10-14 LAB — COMPREHENSIVE METABOLIC PANEL
ALT: 19 U/L (ref 0–44)
AST: 32 U/L (ref 15–41)
Albumin: 2.3 g/dL — ABNORMAL LOW (ref 3.5–5.0)
Alkaline Phosphatase: 167 U/L — ABNORMAL HIGH (ref 38–126)
Anion gap: 13 (ref 5–15)
BUN: 52 mg/dL — ABNORMAL HIGH (ref 8–23)
CO2: 23 mmol/L (ref 22–32)
Calcium: 7.6 mg/dL — ABNORMAL LOW (ref 8.9–10.3)
Chloride: 99 mmol/L (ref 98–111)
Creatinine, Ser: 5.19 mg/dL — ABNORMAL HIGH (ref 0.61–1.24)
GFR calc Af Amer: 12 mL/min — ABNORMAL LOW (ref >60)
GFR calc non Af Amer: 10 mL/min — ABNORMAL LOW (ref >60)
Glucose, Bld: 95 mg/dL (ref 70–99)
Potassium: 3.3 mmol/L — ABNORMAL LOW (ref 3.5–5.1)
Sodium: 135 mmol/L (ref 135–145)
Total Bilirubin: 0.5 mg/dL (ref 0.3–1.2)
Total Protein: 5.2 g/dL — ABNORMAL LOW (ref 6.5–8.1)

## 2019-10-14 LAB — GRAM STAIN: Gram Stain: NONE SEEN

## 2019-10-14 LAB — BODY FLUID CELL COUNT WITH DIFFERENTIAL
Eos, Fluid: 0 %
Lymphs, Fluid: 53 %
Monocyte-Macrophage-Serous Fluid: 22 % — ABNORMAL LOW (ref 50–90)
Neutrophil Count, Fluid: 25 % (ref 0–25)
Total Nucleated Cell Count, Fluid: 28 cu mm (ref 0–1000)

## 2019-10-14 LAB — PROTEIN, PLEURAL OR PERITONEAL FLUID: Total protein, fluid: <3 g/dL

## 2019-10-14 LAB — LIPASE, BLOOD: Lipase: 12 U/L (ref 11–51)

## 2019-10-14 LAB — LACTATE DEHYDROGENASE, PLEURAL OR PERITONEAL FLUID: LD, Fluid: 150 U/L — ABNORMAL HIGH (ref 3–23)

## 2019-10-14 MED ORDER — FENTANYL CITRATE (PF) 100 MCG/2ML IJ SOLN
50.0000 ug | INTRAMUSCULAR | Status: DC | PRN
  Administered 2019-10-14: 50 ug via INTRAVENOUS
  Filled 2019-10-14: qty 2

## 2019-10-14 MED ORDER — OXYCODONE-ACETAMINOPHEN 5-325 MG PO TABS
1.0000 | ORAL_TABLET | Freq: Once | ORAL | Status: AC
  Administered 2019-10-14: 1 via ORAL
  Filled 2019-10-14: qty 1

## 2019-10-14 MED ORDER — POTASSIUM CHLORIDE CRYS ER 20 MEQ PO TBCR
40.0000 meq | EXTENDED_RELEASE_TABLET | Freq: Once | ORAL | Status: AC
  Administered 2019-10-14: 40 meq via ORAL
  Filled 2019-10-14: qty 2

## 2019-10-14 NOTE — ED Provider Notes (Signed)
[facility] EMERGENCY DEPARTMENT Provider Note   CSN: [id number] Arrival date & time: 10/14/19  7106     History Chief Complaint  Patient presents with  . Chest Pain    [name] is a 74 y.o. male.  Patient with history of coronary artery disease, congestive heart failure, high blood pressure, end-stage renal disease on dialysis for the past year in [place], reflux, paracentesis performed recently, myelodysplastic syndrome presents with worsening abdominal discomfort diffuse and upper.  This is overall similar to when he needed to have paracentesis done in the past.  Patient denies fevers or chills.  Patient denies any chest pain.  Patient has mild shortness of breath due to the pain and fluid accumulation.        Past Medical History:  Diagnosis Date  . Anxiety   . Arthritis   . BPH (benign prostatic hyperplasia)   . CAD (coronary artery disease)    DES to circumflex 07/2016, moderate residual LAD and RCA, small 90% OM3 - managed medically  . CHF (congestive heart failure) ([place])   . Chronic lower back pain   . Depression   . ESRD (end stage renal disease) ([place])    Hemo MWF [facility]  . Essential hypertension   . Gastritis   . GERD (gastroesophageal reflux disease)   . History of kidney stones   . Leukemia ([place])   . Leukocytosis   . 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[place])   . 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[place])    Likely MDS/MPN, unclassifiable - folowed at [facility]  . Pleural effusion    had Thorancentesis at [facility]  . Pneumonia    last time 08/2018- Aspiration Pneumonia - unintentional opiate overdose  . Renal insufficiency   . Spleen enlarged   . Thrombocythemia ([place])   . Type II diabetes mellitus [facility])     Patient Active Problem List   Diagnosis Date Noted  . Thrombocytopenia ([place]) 09/15/2019  . Subarachnoid hemorrhage ([place]) 09/14/2019  . Ascites 09/01/2019  . Abnormal CT of the abdomen 09/01/2019  . Acute respiratory disease due to COVID-19 virus  07/23/2019  . Intractable pain 04/19/2019  . Elevated d-dimer 04/19/2019  . RUQ abdominal pain 11/18/2017  . Acute CHF (congestive heart failure) ([place]) 09/15/2016  . Essential hypertension 08/14/2016  . Headache 08/14/2016  . Hyperlipidemia LDL goal <70 08/14/2016  . Abnormal nuclear stress test   . 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 Acute respiratory failure with hypoxia ([place]) 04/27/2016  . Hypoxia   . Hematoma 04/25/2016  . Mass of chest wall, left   . Abnormal partial thromboplastin time (PTT)   . 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 Coronary artery disease due to lipid rich plaque 04/15/2016  . Leukocytosis 04/15/2016  . Diabetes mellitus ([place]) 04/15/2016  . Pleuritic pain 04/15/2016  . ESRD on hemodialysis ([place]) 04/15/2016  . Left shoulder pain 04/15/2016  . History of adenomatous polyp of colon 01/21/2013  . GERD (gastroesophageal reflux disease) 01/21/2013    Past Surgical History:  Procedure Laterality Date  . AV FISTULA PLACEMENT Left 04/14/2019   Procedure: ARTERIOVENOUS (AV) FISTULA CREATION LEFT ARM;  Surgeon: Rosetta Posner, MD;  Location: [place];  Service: Vascular;  Laterality: Left;  . BIOPSY  12/08/2017   Procedure: BIOPSY;  Surgeon: Danie Binder, MD;  Location[facility];  Service: Endoscopy;;  gastric  . BONE MARROW BIOPSY     x 3 times  . CARDIAC CATHETERIZATION N/A 08/12/2016   Procedure: Left Heart Cath and Coronary  Angiography;  Surgeon: Jettie Booze, MD;  Location: [place] CV LAB;  Service: Cardiovascular;  Laterality: N/A;  . CARDIAC CATHETERIZATION N/A 08/12/2016   Procedure: Coronary Stent Intervention;  Surgeon: Jettie Booze, MD;  Location: [place] CV LAB;  Service: Cardiovascular;  Laterality: N/A;  . CARDIAC CATHETERIZATION  2000s X 1  . COLONOSCOPY WITH PROPOFOL N/A 02/01/2013   SLF: 1. 23 colon polyps removed. 2 retrieved. 2. Mild diverticulosis in teh sigmoid colon 3. Small internal hemorrhoids 4. The colon is  redundant   . COLONOSCOPY WITH PROPOFOL N/A 12/08/2017   Procedure: COLONOSCOPY WITH PROPOFOL;  Surgeon: Danie Binder, MD;  Location[facility];  Service: Endoscopy;  Laterality: N/A;  1:45pm  . CYSTOSCOPY W/ STONE MANIPULATION    . ESOPHAGOGASTRODUODENOSCOPY (EGD) WITH PROPOFOL N/A 02/01/2013   SLF: 1. Moderate erosive gastritis  . ESOPHAGOGASTRODUODENOSCOPY (EGD) WITH PROPOFOL N/A 12/08/2017   Procedure: ESOPHAGOGASTRODUODENOSCOPY (EGD) WITH PROPOFOL;  Surgeon: Danie Binder, MD;  [facility];  Service: Endoscopy;  Laterality: N/A;  . IR PARACENTESIS  09/20/2019  . IR REMOVAL TUN CV CATH W/O FL  09/06/2019  . [place]  . POLYPECTOMY N/A 02/01/2013   Procedure: POLYPECTOMY;  Surgeon: Danie Binder, MD;  Location: [facility] ORS;  Service: Endoscopy;  Laterality: N/A;       Family History  Problem Relation Age of Onset  . Heart attack Mother   . CVA Mother   . Heart attack Father   . Colon cancer Neg Hx     Social History   Tobacco Use  . Smoking status: Former Smoker    Packs/day: 1.00    Years: 20.00    Pack years: 20.00    Types: Cigars    Start date: 06/25/1975    Quit date: 08/04/2013    Years since quitting: 6.1  . Smokeless tobacco: Never Used  Substance Use Topics  . Alcohol use: Not Currently    Comment: Remote history of ETOH abuse, "when I was young and dumb"   . Drug use: No    Home Medications Prior to Admission medications   Medication Sig Start Date End Date Taking? Authorizing Provider  acetaminophen (TYLENOL) 500 MG tablet Take 1,000 mg by mouth 2 (two) times daily as needed for mild pain or moderate pain.    Yes [provider]  DULoxetine (CYMBALTA) 60 MG capsule Take 1 capsule (60 mg total) by mouth daily. 04/21/19  Yes Emokpae, Courage, MD  HYDROcodone-acetaminophen (NORCO/VICODIN) 5-325 MG tablet Take 1 tablet by mouth 3 (three) times daily.   Yes [provider]  nitroGLYCERIN (NITROSTAT) 0.4 MG SL tablet  Place 1 tablet (0.4 mg total) under the tongue every 5 (five) minutes x 3 doses as needed for chest pain (if no relief after 3rd dose, proceed to the ED for an evaluation or call 911). 10/04/18  Yes BranchAlphonse Guild, MD  ruxolitinib phosphate (JAKAFI) 5 MG tablet Take 5 mg by mouth daily.   Yes [provider]  sevelamer carbonate (RENVELA) 800 MG tablet Take 800 mg by mouth 3 (three) times daily with meals. Takes 3 tablets by mouth 3 times daily with meals.   Yes [provider]  traZODone (DESYREL) 50 MG tablet Take 1 tablet (50 mg total) by mouth at bedtime as needed for sleep. 04/21/19  Yes Emokpae, Courage, MD  Butalbital-APAP-Caffeine 50-300-40 MG CAPS Take 1-2 capsules by mouth every 6 (six) hours as needed. 10/05/19  [provider]  pravastatin (PRAVACHOL) 40 MG tablet Take 40 mg by mouth daily. 07/01/19   [provider]    Allergies    Hydrocodone  Review of Systems   Review of Systems  Constitutional: Negative for chills and fever.  HENT: Negative for congestion.   Eyes: Negative for visual disturbance.  Respiratory: Positive for shortness of breath.   Cardiovascular: Negative for chest pain.  Gastrointestinal: Positive for abdominal pain and nausea. Negative for vomiting.  Genitourinary: Negative for dysuria and flank pain.  Musculoskeletal: Negative for back pain, neck pain and neck stiffness.  Skin: Negative for rash.  Neurological: Negative for light-headedness and headaches.    Physical Exam Updated Vital Signs BP (!) 148/71   Pulse 66   Temp 98.5 F (36.9 C) (Oral)   Resp 14   Ht 6' (1.829 m)   Wt 75.3 kg   SpO2 96%   BMI 22.51 kg/m   Physical Exam Vitals and nursing note reviewed.  Constitutional:      Appearance: He is well-developed.  HENT:     Head: Normocephalic and atraumatic.  Eyes:     General:        Right eye: No discharge.        Left eye: No discharge.     Conjunctiva/sclera: Conjunctivae normal.  Neck:      Trachea: No tracheal deviation.  Cardiovascular:     Rate and Rhythm: Normal rate and regular rhythm.  Pulmonary:     Effort: Pulmonary effort is normal.     Breath sounds: Normal breath sounds.  Abdominal:     General: There is no distension.     Palpations: Abdomen is soft.     Tenderness: There is abdominal tenderness (diffuse mild, fluid wave). There is no guarding.  Musculoskeletal:     Cervical back: Normal range of motion and neck supple.  Skin:    General: Skin is warm.     Findings: No rash.  Neurological:     Mental Status: He is alert and oriented to person, place, and time.     ED Results / Procedures / Treatments   Labs (all labs ordered are listed, but only abnormal results are displayed) Labs Reviewed  CBC WITH DIFFERENTIAL/PLATELET - Abnormal; Notable for the following components:      Result Value   WBC 12.8 (*)    RBC 2.73 (*)    Hemoglobin 8.6 (*)    HCT 25.9 (*)    RDW 20.5 (*)    Platelets 142 (*)    nRBC 0.7 (*)    Monocytes Absolute 1.6 (*)    Basophils Absolute 0.3 (*)    Abs Immature Granulocytes 4.15 (*)    All other components within normal limits  COMPREHENSIVE METABOLIC PANEL - Abnormal; Notable for the following components:   Potassium 3.3 (*)    BUN 52 (*)    Creatinine, Ser 5.19 (*)    Calcium 7.6 (*)    Total Protein 5.2 (*)    Albumin 2.3 (*)    Alkaline Phosphatase 167 (*)    GFR calc non Af Amer 10 (*)    GFR calc Af Amer 12 (*)    All other components within normal limits  BODY FLUID CELL COUNT WITH DIFFERENTIAL - Abnormal; Notable for the following components:   Appearance, Fluid HAZY (*)    Monocyte-Macrophage-Serous Fluid 22 (*)    All other components within normal limits  LACTATE DEHYDROGENASE, PLEURAL OR PERITONEAL FLUID - Abnormal;  Notable for the following components:   LD, Fluid 150 (*)    All other components within normal limits  CULTURE, BODY FLUID-BOTTLE  GRAM STAIN  LIPASE, BLOOD  GLUCOSE, PLEURAL OR  PERITONEAL FLUID  PROTEIN, PLEURAL OR PERITONEAL FLUID  ALBUMIN, PLEURAL OR PERITONEAL FLUID  PATHOLOGIST SMEAR REVIEW    EKG None  Radiology DG Chest 2 View  Result Date: 10/14/2019 CLINICAL DATA:  Epigastric pain since last evening, weakness EXAM: CHEST - 2 VIEW COMPARISON:  08/24/2019 FINDINGS: Frontal and lateral views of the chest demonstrate interval removal of the right internal jugular dialysis catheter. The cardiac silhouette is unremarkable. No airspace disease, effusion, or pneumothorax. Visualized portions of the upper abdomen are unremarkable. No acute bony abnormalities. IMPRESSION: 1. No acute intrathoracic process. Electronically Signed   By: Randa Ngo M.D.   On: 10/14/2019 08:36   US Paracentesis  Result Date: 10/14/2019 INDICATION: Ascites, abdominal pain, end-stage renal disease on dialysis, CHF, myelodysplastic syndrome, type II diabetes mellitus, hypertension EXAM: ULTRASOUND GUIDED DIAGNOSTIC AND THERAPEUTIC PARACENTESIS MEDICATIONS: None COMPLICATIONS: None immediate PROCEDURE: Informed written consent was obtained from the patient after a discussion of the risks, benefits and alternatives to treatment. A timeout was performed prior to the initiation of the procedure. Initial ultrasound scanning demonstrates a large amount of ascites within the LEFT lower abdominal quadrant. The right lower abdomen was prepped and draped in the usual sterile fashion. 1% lidocaine was used for local anesthesia. Following this, a 5 French catheter was introduced. An ultrasound image was saved for documentation purposes. The paracentesis was performed. The catheter was removed and a dressing was applied. The patient tolerated the procedure well without immediate post procedural complication. FINDINGS: Requested 4 L maximum of ascitic fluid was removed. Samples were sent to the laboratory as requested by the clinical team. IMPRESSION: Successful ultrasound-guided paracentesis yielding 4  liters of peritoneal fluid. Electronically Signed   By: Lavonia Dana M.D.   On: 10/14/2019 11:29    Procedures Ultrasound ED Abd  Date/Time: 10/14/2019 10:04 AM Performed by: Elnora Morrison, MD Authorized by: Elnora Morrison, MD   Procedure details:    Indications: abdominal pain     Assessment for:  Intra-abdominal fluid   Hepatobiliary:  Visualized       Hepatobiliary findings:    Intra-abdominal fluid: identified   Comments:     Large peritoneal fluid   (including critical care time)  Medications Ordered in ED Medications  fentaNYL (SUBLIMAZE) injection 50 mcg (50 mcg Intravenous Given 10/14/19 0817)  potassium chloride SA (KLOR-CON) CR tablet 40 mEq (40 mEq Oral Given 10/14/19 0945)  oxyCODONE-acetaminophen (PERCOCET/ROXICET) 5-325 MG per tablet 1 tablet (1 tablet Oral Given 10/14/19 0945)    ED Course  I have reviewed the triage vital signs and the nursing notes.  Pertinent labs & imaging results that were available during my care of the patient were reviewed by me and considered in my medical decision making (see chart for details).    MDM Rules/Calculators/A&P                      Patient presents with clinical concern for worsening ascites from unknown etiology.  Patient denies alcohol use or known cirrhosis.  Patient has multiple reasons for fluid accumulation including heart failure, kidney failure and myelodysplastic syndrome.  Patient has low albumin on blood work 2.3 and potassium 3.3, oral potassium ordered.  Patient has mild leukocytosis 12.8 and mild chronic anemia 8.6.  Bedside ultrasound confirmed significant peritoneal  fluid.  Discussed with radiologist/ultrasound for paracentesis for therapeutic and diagnostic purposes.  Cell count pending.  Pain medicines given to improve pain.  EKG done on arrival no acute abnormalities, no chest pain. Blood work reviewed no signs of SBP culture sent for fluid sample.  4 L removed by radiology and patient feels improved.   Patient stable for close outpatient follow-up and for normal dialysis.    Final Clinical Impression(s) / ED Diagnoses Final diagnoses:  Abdominal pain  ESRD (end stage renal disease) ([place])  Other ascites    Rx / DC Orders ED Discharge Orders    None       Elnora Morrison, MD 10/14/19 1621

## 2019-10-14 NOTE — Progress Notes (Addendum)
Paracentesis complete no signs of distress.  

## 2019-10-14 NOTE — Procedures (Signed)
PreOperative Dx: Abdominal pain, ascites Postoperative Dx: Abdominal pain, ascites Procedure:   US guided paracentesis Radiologist:  Thornton Papas Anesthesia:  10 ml of1% lidocaine Specimen:  4 L of yellow ascitic fluid EBL:   < 1 ml Complications: None

## 2019-10-14 NOTE — ED Notes (Signed)
Offered a cup of ice water or another drink of choice and he says " I don't want no drink I want out of here"

## 2019-10-14 NOTE — Discharge Instructions (Addendum)
Follow-up with your primary care doctor for reassessment on Monday.  Follow-up for your next scheduled dialysis as you normally would.  Return for worsening or new symptoms.  Follow up your fluid culture results.

## 2019-10-14 NOTE — ED Notes (Signed)
Patient was given 1 nitro at [facility] with no change in abdominal pain.

## 2019-10-14 NOTE — ED Triage Notes (Signed)
C/o chest started last night, epigastric area.  Given ntg sl times one and  Given asa 324 ASA by EMS.  History ascites.  VSS.  SR.  Last HD last Friday.  Pt says, "I didn't feel like it. "

## 2019-10-14 NOTE — ED Notes (Signed)
Returned to room.

## 2019-10-15 DIAGNOSIS — N186 End stage renal disease: Secondary | ICD-10-CM | POA: Diagnosis not present

## 2019-10-15 DIAGNOSIS — Z992 Dependence on renal dialysis: Secondary | ICD-10-CM | POA: Diagnosis not present

## 2019-10-15 DIAGNOSIS — D509 Iron deficiency anemia, unspecified: Secondary | ICD-10-CM | POA: Diagnosis not present

## 2019-10-15 DIAGNOSIS — E559 Vitamin D deficiency, unspecified: Secondary | ICD-10-CM | POA: Diagnosis not present

## 2019-10-15 DIAGNOSIS — D631 Anemia in chronic kidney disease: Secondary | ICD-10-CM | POA: Diagnosis not present

## 2019-10-16 LAB — CULTURE, BODY FLUID W GRAM STAIN -BOTTLE: Culture: NO GROWTH

## 2019-10-17 ENCOUNTER — Other Ambulatory Visit: Payer: Self-pay

## 2019-10-17 ENCOUNTER — Telehealth: Payer: Self-pay | Admitting: Nurse Practitioner

## 2019-10-17 ENCOUNTER — Ambulatory Visit (HOSPITAL_COMMUNITY)
Admission: RE | Admit: 2019-10-17 | Discharge: 2019-10-17 | Disposition: A | Discharge status: Home / Self Care | Payer: Medicare Other | Source: Ambulatory Visit | Attending: Gastroenterology | Admitting: Gastroenterology

## 2019-10-17 ENCOUNTER — Encounter (HOSPITAL_COMMUNITY): Payer: Self-pay

## 2019-10-17 ENCOUNTER — Telehealth (HOSPITAL_COMMUNITY): Payer: Self-pay

## 2019-10-17 DIAGNOSIS — R188 Other ascites: Secondary | ICD-10-CM | POA: Diagnosis not present

## 2019-10-17 LAB — GRAM STAIN: Gram Stain: NONE SEEN

## 2019-10-17 LAB — BODY FLUID CELL COUNT WITH DIFFERENTIAL
Eos, Fluid: 0 %
Lymphs, Fluid: 24 %
Monocyte-Macrophage-Serous Fluid: 10 % — ABNORMAL LOW (ref 50–90)
Neutrophil Count, Fluid: 66 % — ABNORMAL HIGH (ref 0–25)
Other Cells, Fluid: REACTIVE %
Total Nucleated Cell Count, Fluid: 157 cu mm (ref 0–1000)

## 2019-10-17 LAB — PATHOLOGIST SMEAR REVIEW

## 2019-10-17 NOTE — Procedures (Signed)
PreOperative Dx: Ascites Postoperative Dx: Ascites Procedure:   US guided paracentesis Radiologist:  Danis Pembleton Anesthesia:  10 ml of 1% lidocaine Specimen:  3.5 L of yellow ascitic fluid EBL:   < 1 ml Complications:  None 

## 2019-10-17 NOTE — Telephone Encounter (Signed)
Para orders entered. Called and informed pt's daughter.

## 2019-10-17 NOTE — Telephone Encounter (Signed)
PATIENT NEEDS PARA ORDER ENTERED.  STATING HE NEEDS ANOTHER ONE

## 2019-10-18 LAB — PATHOLOGIST SMEAR REVIEW

## 2019-10-19 ENCOUNTER — Emergency Department (HOSPITAL_COMMUNITY): Payer: Medicare Other

## 2019-10-19 ENCOUNTER — Encounter (HOSPITAL_COMMUNITY): Payer: Self-pay | Admitting: Emergency Medicine

## 2019-10-19 ENCOUNTER — Emergency Department (HOSPITAL_COMMUNITY)
Admission: EM | Admit: 2019-10-19 | Discharge: 2019-10-19 | Disposition: A | Discharge status: Home / Self Care | Payer: Medicare Other | Attending: Emergency Medicine | Admitting: Emergency Medicine

## 2019-10-19 ENCOUNTER — Other Ambulatory Visit: Payer: Self-pay

## 2019-10-19 DIAGNOSIS — E1165 Type 2 diabetes mellitus with hyperglycemia: Secondary | ICD-10-CM | POA: Diagnosis not present

## 2019-10-19 DIAGNOSIS — N186 End stage renal disease: Secondary | ICD-10-CM | POA: Diagnosis not present

## 2019-10-19 DIAGNOSIS — Z79899 Other long term (current) drug therapy: Secondary | ICD-10-CM | POA: Insufficient documentation

## 2019-10-19 DIAGNOSIS — S0990XA Unspecified injury of head, initial encounter: Secondary | ICD-10-CM | POA: Diagnosis not present

## 2019-10-19 DIAGNOSIS — Z299 Encounter for prophylactic measures, unspecified: Secondary | ICD-10-CM | POA: Diagnosis not present

## 2019-10-19 DIAGNOSIS — M25512 Pain in left shoulder: Secondary | ICD-10-CM | POA: Insufficient documentation

## 2019-10-19 DIAGNOSIS — I132 Hypertensive heart and chronic kidney disease with heart failure and with stage 5 chronic kidney disease, or end stage renal disease: Secondary | ICD-10-CM | POA: Insufficient documentation

## 2019-10-19 DIAGNOSIS — S4992XA Unspecified injury of left shoulder and upper arm, initial encounter: Secondary | ICD-10-CM | POA: Diagnosis not present

## 2019-10-19 DIAGNOSIS — W19XXXA Unspecified fall, initial encounter: Secondary | ICD-10-CM | POA: Insufficient documentation

## 2019-10-19 DIAGNOSIS — Z87891 Personal history of nicotine dependence: Secondary | ICD-10-CM | POA: Insufficient documentation

## 2019-10-19 DIAGNOSIS — R519 Headache, unspecified: Secondary | ICD-10-CM | POA: Diagnosis not present

## 2019-10-19 DIAGNOSIS — Y939 Activity, unspecified: Secondary | ICD-10-CM | POA: Insufficient documentation

## 2019-10-19 DIAGNOSIS — Y929 Unspecified place or not applicable: Secondary | ICD-10-CM | POA: Diagnosis not present

## 2019-10-19 DIAGNOSIS — S199XXA Unspecified injury of neck, initial encounter: Secondary | ICD-10-CM | POA: Diagnosis not present

## 2019-10-19 DIAGNOSIS — R52 Pain, unspecified: Secondary | ICD-10-CM | POA: Diagnosis not present

## 2019-10-19 DIAGNOSIS — S79912A Unspecified injury of left hip, initial encounter: Secondary | ICD-10-CM | POA: Diagnosis not present

## 2019-10-19 DIAGNOSIS — I2510 Atherosclerotic heart disease of native coronary artery without angina pectoris: Secondary | ICD-10-CM | POA: Diagnosis not present

## 2019-10-19 DIAGNOSIS — E1122 Type 2 diabetes mellitus with diabetic chronic kidney disease: Secondary | ICD-10-CM | POA: Insufficient documentation

## 2019-10-19 DIAGNOSIS — Z713 Dietary counseling and surveillance: Secondary | ICD-10-CM | POA: Diagnosis not present

## 2019-10-19 DIAGNOSIS — M25552 Pain in left hip: Secondary | ICD-10-CM | POA: Diagnosis not present

## 2019-10-19 DIAGNOSIS — R0902 Hypoxemia: Secondary | ICD-10-CM | POA: Diagnosis not present

## 2019-10-19 DIAGNOSIS — Y999 Unspecified external cause status: Secondary | ICD-10-CM | POA: Insufficient documentation

## 2019-10-19 DIAGNOSIS — Z6825 Body mass index (BMI) 25.0-25.9, adult: Secondary | ICD-10-CM | POA: Diagnosis not present

## 2019-10-19 DIAGNOSIS — I10 Essential (primary) hypertension: Secondary | ICD-10-CM | POA: Diagnosis not present

## 2019-10-19 DIAGNOSIS — I509 Heart failure, unspecified: Secondary | ICD-10-CM | POA: Insufficient documentation

## 2019-10-19 DIAGNOSIS — Z992 Dependence on renal dialysis: Secondary | ICD-10-CM | POA: Diagnosis not present

## 2019-10-19 DIAGNOSIS — E7800 Pure hypercholesterolemia, unspecified: Secondary | ICD-10-CM | POA: Diagnosis not present

## 2019-10-19 MED ORDER — OXYCODONE-ACETAMINOPHEN 5-325 MG PO TABS
1.0000 | ORAL_TABLET | Freq: Once | ORAL | Status: AC
  Administered 2019-10-19: 1 via ORAL
  Filled 2019-10-19: qty 1

## 2019-10-19 MED ORDER — HYDROCODONE-ACETAMINOPHEN 5-325 MG PO TABS: 1.0000 | ORAL_TABLET | Freq: Once | ORAL | Status: DC

## 2019-10-19 MED ORDER — HYDROCODONE-ACETAMINOPHEN 5-325 MG PO TABS
1.0000 | ORAL_TABLET | Freq: Once | ORAL | Status: AC
  Administered 2019-10-19: 1 via ORAL
  Filled 2019-10-19: qty 1

## 2019-10-19 NOTE — ED Triage Notes (Signed)
Fall hitting left side of head.  Hit head on plastic water dispenser.  C/o neck and back pain.

## 2019-10-19 NOTE — Discharge Instructions (Addendum)
Follow up with your md next week if needed °

## 2019-10-20 LAB — CULTURE, BODY FLUID W GRAM STAIN -BOTTLE

## 2019-10-21 ENCOUNTER — Telehealth: Payer: Self-pay

## 2019-10-21 DIAGNOSIS — I120 Hypertensive chronic kidney disease with stage 5 chronic kidney disease or end stage renal disease: Secondary | ICD-10-CM | POA: Diagnosis not present

## 2019-10-21 DIAGNOSIS — D696 Thrombocytopenia, unspecified: Secondary | ICD-10-CM | POA: Diagnosis not present

## 2019-10-21 DIAGNOSIS — K7031 Alcoholic cirrhosis of liver with ascites: Secondary | ICD-10-CM | POA: Diagnosis not present

## 2019-10-21 DIAGNOSIS — N186 End stage renal disease: Secondary | ICD-10-CM | POA: Diagnosis not present

## 2019-10-21 DIAGNOSIS — I609 Nontraumatic subarachnoid hemorrhage, unspecified: Secondary | ICD-10-CM | POA: Diagnosis not present

## 2019-10-21 DIAGNOSIS — I2510 Atherosclerotic heart disease of native coronary artery without angina pectoris: Secondary | ICD-10-CM | POA: Diagnosis not present

## 2019-10-21 DIAGNOSIS — Z992 Dependence on renal dialysis: Secondary | ICD-10-CM | POA: Diagnosis not present

## 2019-10-21 DIAGNOSIS — E1122 Type 2 diabetes mellitus with diabetic chronic kidney disease: Secondary | ICD-10-CM | POA: Diagnosis not present

## 2019-10-21 DIAGNOSIS — D471 Chronic myeloproliferative disease: Secondary | ICD-10-CM | POA: Diagnosis not present

## 2019-10-21 NOTE — Telephone Encounter (Signed)
Post ED Visit - Positive Culture Follow-up  Culture report reviewed by antimicrobial stewardship pharmacist: [place] Team []  Elenor Quinones, Pharm.D. []  Heide Guile, Pharm.D., BCPS AQ-ID []  Parks Neptune, Pharm.D., BCPS []  Alycia Rossetti, Pharm.D., BCPS []  [place], [place].D., BCPS, AAHIVP []  Legrand Como, Pharm.D., BCPS, AAHIVP []  Salome Arnt, PharmD, BCPS []  Johnnette Gourd, PharmD, BCPS []  Hughes Better, PharmD, BCPS []  Leeroy Cha, PharmD []  Laqueta Linden, PharmD, BCPS []  Albertina Parr, PharmD Pharm D [place] Team []  Leodis Sias, PharmD []  Lindell Spar, PharmD []  Royetta Asal, PharmD []  Graylin Shiver, Rph []  Rema Fendt) Glennon Mac, PharmD []  Arlyn Dunning, PharmD []  Netta Cedars, PharmD []  Dia Sitter, PharmD []  Leone Haven, PharmD []  Gretta Arab, PharmD []  Theodis Shove, PharmD []  Peggyann Juba, PharmD []  Reuel Boom, PharmD   Positive Body Fluid culture Treated with None needed, organism sensitive to the same and no further patient follow-up is required at this time.  Genia Del 10/21/2019, 10:27 AM

## 2019-10-21 NOTE — ED Provider Notes (Signed)
[facility] EMERGENCY DEPARTMENT Provider Note   CSN: [id number] Arrival date & time: 10/19/19  1819     History Chief Complaint  Patient presents with  . Fall    RISHIKESH KHACHATRYAN is a 74 y.o. male.  Patient fell today hit his head left shoulder left hip with no loss of consciousness  The history is provided by the patient. No language interpreter was used.  Fall This is a new problem. The current episode started 3 to 5 hours ago. The problem occurs rarely. The problem has been resolved. Associated symptoms include headaches. Pertinent negatives include no chest pain and no abdominal pain. Nothing aggravates the symptoms. Nothing relieves the symptoms. He has tried nothing for the symptoms. The treatment provided no relief.       Past Medical History:  Diagnosis Date  . Anxiety   . Arthritis   . BPH (benign prostatic hyperplasia)   . CAD (coronary artery disease)    DES to circumflex 07/2016, moderate residual LAD and RCA, small 90% OM3 - managed medically  . CHF (congestive heart failure) ([place])   . Chronic lower back pain   . Depression   . ESRD (end stage renal disease) ([place])    Hemo MWF [facility]  . Essential hypertension   . Gastritis   . GERD (gastroesophageal reflux disease)   . History of kidney stones   . Leukemia ([place])   . Leukocytosis   . 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 Pleural effusion    had Thorancentesis at [facility]  . Pneumonia    last time 08/2018- Aspiration Pneumonia - unintentional opiate overdose  . Renal insufficiency   . Spleen enlarged   . Thrombocythemia ([place])   . Type II diabetes mellitus [facility])     Patient Active Problem List   Diagnosis Date Noted  . Thrombocytopenia ([place]) 09/15/2019  . Subarachnoid hemorrhage ([place]) 09/14/2019  . Ascites 09/01/2019  . Abnormal CT of the abdomen 09/01/2019  . Acute respiratory disease due to COVID-19  virus 07/23/2019  . Intractable pain 04/19/2019  . Elevated d-dimer 04/19/2019  . RUQ abdominal pain 11/18/2017  . Acute CHF (congestive heart failure) ([place]) 09/15/2016  . Essential hypertension 08/14/2016  . Headache 08/14/2016  . Hyperlipidemia LDL goal <70 08/14/2016  . Abnormal nuclear stress test   . 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 Acute respiratory failure with hypoxia ([place]) 04/27/2016  . Hypoxia   . Hematoma 04/25/2016  . Mass of chest wall, left   . Abnormal partial thromboplastin time (PTT)   . 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 Coronary artery disease due to lipid rich plaque 04/15/2016  . Leukocytosis 04/15/2016  . Diabetes mellitus ([place]) 04/15/2016  . Pleuritic pain 04/15/2016  . ESRD on hemodialysis ([place]) 04/15/2016  . Left shoulder pain 04/15/2016  . History of adenomatous polyp of colon 01/21/2013  . GERD (gastroesophageal reflux disease) 01/21/2013    Past Surgical History:  Procedure Laterality Date  . AV FISTULA PLACEMENT Left 04/14/2019   Procedure: ARTERIOVENOUS (AV) FISTULA CREATION LEFT ARM;  Surgeon: Rosetta Posner, MD;  Location: [place];  Service: Vascular;  Laterality: Left;  . BIOPSY  12/08/2017   Procedure: BIOPSY;  Surgeon: Danie Binder, MD;  [facility];  Service: Endoscopy;;  gastric  . BONE MARROW BIOPSY     x 3 times  . CARDIAC CATHETERIZATION N/A 08/12/2016   Procedure:  Left Heart Cath and Coronary Angiography;  Surgeon: Jettie Booze, MD;  Location: [place] CV LAB;  Service: Cardiovascular;  Laterality: N/A;  . CARDIAC CATHETERIZATION N/A 08/12/2016   Procedure: Coronary Stent Intervention;  Surgeon: Jettie Booze, MD;  Location: [place] CV LAB;  Service: Cardiovascular;  Laterality: N/A;  . CARDIAC CATHETERIZATION  2000s X 1  . COLONOSCOPY WITH PROPOFOL N/A 02/01/2013   SLF: 1. 23 colon polyps removed. 2 retrieved. 2. Mild diverticulosis in teh sigmoid colon 3. Small internal hemorrhoids 4. The colon  is redundant   . COLONOSCOPY WITH PROPOFOL N/A 12/08/2017   Procedure: COLONOSCOPY WITH PROPOFOL;  Surgeon: Danie Binder, MD;  Location[facility];  Service: Endoscopy;  Laterality: N/A;  1:45pm  . CYSTOSCOPY W/ STONE MANIPULATION    . ESOPHAGOGASTRODUODENOSCOPY (EGD) WITH PROPOFOL N/A 02/01/2013   SLF: 1. Moderate erosive gastritis  . ESOPHAGOGASTRODUODENOSCOPY (EGD) WITH PROPOFOL N/A 12/08/2017   Procedure: ESOPHAGOGASTRODUODENOSCOPY (EGD) WITH PROPOFOL;  Surgeon: Danie Binder, MD;  [facility];  Service: Endoscopy;  Laterality: N/A;  . IR PARACENTESIS  09/20/2019  . IR REMOVAL TUN CV CATH W/O FL  09/06/2019  . [place]  . POLYPECTOMY N/A 02/01/2013   Procedure: POLYPECTOMY;  Surgeon: Danie Binder, MD;  Location: [facility];  Service: Endoscopy;  Laterality: N/A;       Family History  Problem Relation Age of Onset  . Heart attack Mother   . CVA Mother   . Heart attack Father   . Colon cancer Neg Hx     Social History   Tobacco Use  . Smoking status: Former Smoker    Packs/day: 1.00    Years: 20.00    Pack years: 20.00    Types: Cigars    Start date: 06/25/1975    Quit date: 08/04/2013    Years since quitting: 6.2  . Smokeless tobacco: Never Used  Substance Use Topics  . Alcohol use: Not Currently    Comment: Remote history of ETOH abuse, "when I was young and dumb"   . Drug use: No    Home Medications Prior to Admission medications   Medication Sig Start Date End Date Taking? Authorizing Provider  acetaminophen (TYLENOL) 500 MG tablet Take 1,000 mg by mouth 2 (two) times daily as needed for mild pain or moderate pain.    Yes [provider]  DULoxetine (CYMBALTA) 60 MG capsule Take 1 capsule (60 mg total) by mouth daily. 04/21/19  Yes Emokpae, Courage, MD  nitroGLYCERIN (NITROSTAT) 0.4 MG SL tablet Place 1 tablet (0.4 mg total) under the tongue every 5 (five) minutes x 3 doses as needed for chest pain (if no relief after 3rd dose,  proceed to the ED for an evaluation or call 911). 10/04/18  Yes BranchAlphonse Guild, MD  ruxolitinib phosphate (JAKAFI) 5 MG tablet Take 5 mg by mouth daily.   Yes [provider]  sevelamer carbonate (RENVELA) 800 MG tablet Take 2,400 mg by mouth 3 (three) times daily with meals. Takes 3 tablets by mouth 3 times daily with meals.    Yes [provider]  traZODone (DESYREL) 50 MG tablet Take 1 tablet (50 mg total) by mouth at bedtime as needed for sleep. 04/21/19  Yes Roxan Hockey, MD    Allergies    Hydrocodone  Review of Systems   Review of Systems  Constitutional: Negative for appetite change and fatigue.  HENT: Negative for congestion, ear discharge and sinus  pressure.   Eyes: Negative for discharge.  Respiratory: Negative for cough.   Cardiovascular: Negative for chest pain.  Gastrointestinal: Negative for abdominal pain and diarrhea.  Genitourinary: Negative for frequency and hematuria.  Musculoskeletal: Negative for back pain.       Left shoulder and hip pain  Skin: Negative for rash.  Neurological: Positive for headaches. Negative for seizures.  Psychiatric/Behavioral: Negative for hallucinations.    Physical Exam Updated Vital Signs BP 138/70   Pulse 77   Temp 98.8 F (37.1 C) (Oral)   Resp 17   Ht 6' (1.829 m)   Wt 75.3 kg   SpO2 94%   BMI 22.51 kg/m   Physical Exam Vitals and nursing note reviewed.  Constitutional:      Appearance: He is well-developed.  HENT:     Head: Normocephalic.     Nose: Nose normal.  Eyes:     General: No scleral icterus.    Conjunctiva/sclera: Conjunctivae normal.  Neck:     Thyroid: No thyromegaly.  Cardiovascular:     Rate and Rhythm: Normal rate and regular rhythm.     Heart sounds: No murmur. No friction rub. No gallop.   Pulmonary:     Breath sounds: No stridor. No wheezing or rales.  Chest:     Chest wall: No tenderness.  Abdominal:     General: There is no distension.     Tenderness: There is no  abdominal tenderness. There is no rebound.  Musculoskeletal:        General: Normal range of motion.     Cervical back: Neck supple.     Comments: Mild tenderness left shoulder and left hip  Lymphadenopathy:     Cervical: No cervical adenopathy.  Skin:    Findings: No erythema or rash.  Neurological:     Mental Status: He is oriented to person, place, and time.     Motor: No abnormal muscle tone.     Coordination: Coordination normal.  Psychiatric:        Behavior: Behavior normal.     ED Results / Procedures / Treatments   Labs (all labs ordered are listed, but only abnormal results are displayed) Labs Reviewed - No data to display  EKG EKG Interpretation  Date/Time:  Wednesday October 19 2019 18:31:45 EDT Ventricular Rate:  83 PR Interval:    QRS Duration: 100 QT Interval:  392 QTC Calculation: 461 R Axis:   -8 Text Interpretation: Sinus rhythm Nonspecific repol abnormality, diffuse leads No significant change since last tracing 14 October 2019 Confirmed by Pattricia Boss [phone]) on 10/20/2019 11:08:59 AM   Radiology CT Head Wo Contrast  Result Date: 10/19/2019 CLINICAL DATA:  [place] and hit head EXAM: CT HEAD WITHOUT CONTRAST CT CERVICAL SPINE WITHOUT CONTRAST TECHNIQUE: Multidetector CT imaging of the head and cervical spine was performed following the standard protocol without intravenous contrast. Multiplanar CT image reconstructions of the cervical spine were also generated. COMPARISON:  CT brain and cervical spine 09/14/2019, CT brain 06/20/2019 FINDINGS: CT HEAD FINDINGS Brain: No acute territorial infarction, hemorrhage, or intracranial mass is visualized. Moderate atrophy. Chronic lacunar infarcts within the basal ganglia. Encephalomalacia in the right temporal lobe. Stable ventricle size. Moderate hypodensity in the white matter consistent with chronic small vessel ischemic change. Vascular: No hyperdense vessels. Vertebral and carotid vascular calcification. Skull: No skull  fracture Sinuses/Orbits: No acute finding. Other: None CT CERVICAL SPINE FINDINGS Alignment: No subluxation.  Facet alignment within normal limits. Skull base and vertebrae: No acute  fracture. No primary bone lesion or focal pathologic process. Soft tissues and spinal canal: No prevertebral fluid or swelling. No visible canal hematoma. Disc levels: Multiple level degenerative change, most notable at C5-C6 and C6-C7. Facet degenerative changes at multiple levels. Upper chest: Negative. Other: None IMPRESSION: 1. No CT evidence for acute intracranial abnormality. Atrophy and chronic small vessel ischemic change of the white matter. Encephalomalacia in the right temporal lobe and chronic lacunar infarcts in the basal ganglia 2. Degenerative changes of the cervical spine. No acute osseous abnormality. Electronically Signed   By: Donavan Foil M.D.   On: 10/19/2019 20:21   CT Cervical Spine Wo Contrast  Result Date: 10/19/2019 CLINICAL DATA:  [place] and hit head EXAM: CT HEAD WITHOUT CONTRAST CT CERVICAL SPINE WITHOUT CONTRAST TECHNIQUE: Multidetector CT imaging of the head and cervical spine was performed following the standard protocol without intravenous contrast. Multiplanar CT image reconstructions of the cervical spine were also generated. COMPARISON:  CT brain and cervical spine 09/14/2019, CT brain 06/20/2019 FINDINGS: CT HEAD FINDINGS Brain: No acute territorial infarction, hemorrhage, or intracranial mass is visualized. Moderate atrophy. Chronic lacunar infarcts within the basal ganglia. Encephalomalacia in the right temporal lobe. Stable ventricle size. Moderate hypodensity in the white matter consistent with chronic small vessel ischemic change. Vascular: No hyperdense vessels. Vertebral and carotid vascular calcification. Skull: No skull fracture Sinuses/Orbits: No acute finding. Other: None CT CERVICAL SPINE FINDINGS Alignment: No subluxation.  Facet alignment within normal limits. Skull base and  vertebrae: No acute fracture. No primary bone lesion or focal pathologic process. Soft tissues and spinal canal: No prevertebral fluid or swelling. No visible canal hematoma. Disc levels: Multiple level degenerative change, most notable at C5-C6 and C6-C7. Facet degenerative changes at multiple levels. Upper chest: Negative. Other: None IMPRESSION: 1. No CT evidence for acute intracranial abnormality. Atrophy and chronic small vessel ischemic change of the white matter. Encephalomalacia in the right temporal lobe and chronic lacunar infarcts in the basal ganglia 2. Degenerative changes of the cervical spine. No acute osseous abnormality. Electronically Signed   By: Donavan Foil M.D.   On: 10/19/2019 20:21   DG Shoulder Left  Result Date: 10/19/2019 CLINICAL DATA:  [place], left shoulder pain EXAM: LEFT SHOULDER - 2+ VIEW COMPARISON:  04/15/2016 FINDINGS: Frontal and transscapular views of the left shoulder demonstrate moderate glenohumeral joint space narrowing and osteophyte formation. Mild spurring of the acromion process. No fracture, subluxation, or dislocation. Left chest is clear. IMPRESSION: 1. Osteoarthritis, no acute fracture. Electronically Signed   By: Randa Ngo M.D.   On: 10/19/2019 19:48   DG Hip Unilat W or Wo Pelvis 2-3 Views Left  Result Date: 10/19/2019 CLINICAL DATA:  [place], left hip pain EXAM: DG HIP (WITH OR WITHOUT PELVIS) 2-3V LEFT COMPARISON:  08/24/2019 FINDINGS: Frontal view of the pelvis as well as frontal and cross-table lateral views of the left hip are obtained. There are no acute displaced fractures. Joint spaces are relatively well preserved. Large injection granulomata seen within the buttocks. Diffuse vascular calcifications. IMPRESSION: 1. No acute bony abnormality. Electronically Signed   By: Randa Ngo M.D.   On: 10/19/2019 19:50    Procedures Procedures (including critical care time)  Medications Ordered in ED Medications  oxyCODONE-acetaminophen  (PERCOCET/ROXICET) 5-325 MG per tablet 1 tablet (1 tablet Oral Given 10/19/19 2017)  HYDROcodone-acetaminophen (NORCO/VICODIN) 5-325 MG per tablet 1 tablet (1 tablet Oral Given 10/19/19 2102)    ED Course  I have reviewed the triage vital signs and the  nursing notes.  Pertinent labs & imaging results that were available during my care of the patient were reviewed by me and considered in my medical decision making (see chart for details).    MDM Rules/Calculators/A&P                     Patient with a fall but no significant injuries.  He is got a contusion to left shoulder and left hip and head.  He will follow-up as needed Final Clinical Impression(s) / ED Diagnoses Final diagnoses:  Fall, initial encounter    Rx / DC Orders ED Discharge Orders    None       Milton Ferguson, MD 10/21/19 1116

## 2019-10-22 LAB — CULTURE, BODY FLUID W GRAM STAIN -BOTTLE: Culture: NO GROWTH

## 2019-10-24 DIAGNOSIS — E785 Hyperlipidemia, unspecified: Secondary | ICD-10-CM | POA: Diagnosis not present

## 2019-10-24 DIAGNOSIS — E1165 Type 2 diabetes mellitus with hyperglycemia: Secondary | ICD-10-CM | POA: Diagnosis not present

## 2019-10-24 DIAGNOSIS — M109 Gout, unspecified: Secondary | ICD-10-CM | POA: Diagnosis not present

## 2019-10-24 DIAGNOSIS — Z299 Encounter for prophylactic measures, unspecified: Secondary | ICD-10-CM | POA: Diagnosis not present

## 2019-10-24 DIAGNOSIS — E1122 Type 2 diabetes mellitus with diabetic chronic kidney disease: Secondary | ICD-10-CM | POA: Diagnosis not present

## 2019-10-24 DIAGNOSIS — F330 Major depressive disorder, recurrent, mild: Secondary | ICD-10-CM | POA: Diagnosis not present

## 2019-10-24 DIAGNOSIS — R52 Pain, unspecified: Secondary | ICD-10-CM | POA: Diagnosis not present

## 2019-10-24 DIAGNOSIS — N186 End stage renal disease: Secondary | ICD-10-CM | POA: Diagnosis not present

## 2019-10-24 DIAGNOSIS — I10 Essential (primary) hypertension: Secondary | ICD-10-CM | POA: Diagnosis not present

## 2019-10-25 ENCOUNTER — Other Ambulatory Visit: Payer: Self-pay

## 2019-10-25 ENCOUNTER — Telehealth (INDEPENDENT_AMBULATORY_CARE_PROVIDER_SITE_OTHER): Payer: Medicare Other | Admitting: Nurse Practitioner

## 2019-10-25 ENCOUNTER — Ambulatory Visit (HOSPITAL_COMMUNITY): Admission: RE | Admit: 2019-10-25 | Payer: Medicare Other | Source: Ambulatory Visit

## 2019-10-25 ENCOUNTER — Telehealth: Payer: Self-pay

## 2019-10-25 DIAGNOSIS — Z5329 Procedure and treatment not carried out because of patient's decision for other reasons: Secondary | ICD-10-CM

## 2019-10-25 NOTE — Telephone Encounter (Signed)
Spoke to pt's daughter Jeani Hawking), pt unable to do telephone visit today. He has started hospice care for pain management. He was given Morphine last night and a sleeping pill. Pt is "out of it today". Was unable to have fluid drawn off today.  Informed EG, EG advised to cancel telephone visit and they can call if anything is needed.  Called and informed Jeani Hawking, she is agreeable to canceling visit for today.

## 2019-10-26 ENCOUNTER — Encounter (HOSPITAL_COMMUNITY): Payer: Self-pay

## 2019-10-26 ENCOUNTER — Other Ambulatory Visit: Payer: Self-pay

## 2019-10-26 ENCOUNTER — Ambulatory Visit (HOSPITAL_COMMUNITY)
Admission: RE | Admit: 2019-10-26 | Discharge: 2019-10-26 | Disposition: A | Discharge status: Home / Self Care | Payer: Medicare Other | Source: Ambulatory Visit | Attending: Gastroenterology | Admitting: Gastroenterology

## 2019-10-26 DIAGNOSIS — F330 Major depressive disorder, recurrent, mild: Secondary | ICD-10-CM | POA: Diagnosis not present

## 2019-10-26 DIAGNOSIS — R188 Other ascites: Secondary | ICD-10-CM | POA: Diagnosis not present

## 2019-10-26 DIAGNOSIS — M109 Gout, unspecified: Secondary | ICD-10-CM | POA: Diagnosis not present

## 2019-10-26 DIAGNOSIS — I10 Essential (primary) hypertension: Secondary | ICD-10-CM | POA: Diagnosis not present

## 2019-10-26 DIAGNOSIS — N186 End stage renal disease: Secondary | ICD-10-CM | POA: Diagnosis not present

## 2019-10-26 DIAGNOSIS — E785 Hyperlipidemia, unspecified: Secondary | ICD-10-CM | POA: Diagnosis not present

## 2019-10-26 DIAGNOSIS — R52 Pain, unspecified: Secondary | ICD-10-CM | POA: Diagnosis not present

## 2019-10-26 LAB — GRAM STAIN: Gram Stain: NONE SEEN

## 2019-10-26 LAB — BODY FLUID CELL COUNT WITH DIFFERENTIAL
Eos, Fluid: 0 %
Lymphs, Fluid: 39 %
Monocyte-Macrophage-Serous Fluid: 9 % — ABNORMAL LOW (ref 50–90)
Neutrophil Count, Fluid: 52 % — ABNORMAL HIGH (ref 0–25)
Other Cells, Fluid: 1 %
Total Nucleated Cell Count, Fluid: 139 cu mm (ref 0–1000)

## 2019-10-26 NOTE — Procedures (Signed)
PreOperative Dx: Ascites Postoperative Dx: Ascites Procedure:   US guided paracentesis Radiologist:  Thornton Papas Anesthesia:  10 ml of1% lidocaine Specimen:  2 L of yellow to pink ascitic fluid EBL:   < 1 ml Complications: None

## 2019-10-26 NOTE — Progress Notes (Signed)
Patient is now on hospice. Patient and family elected to cancel visit.

## 2019-10-26 NOTE — Progress Notes (Signed)
Paracentesis complete no signs of distress.  

## 2019-10-27 ENCOUNTER — Other Ambulatory Visit: Payer: Self-pay | Admitting: *Deleted

## 2019-10-27 ENCOUNTER — Telehealth: Payer: Self-pay | Admitting: *Deleted

## 2019-10-27 DIAGNOSIS — R188 Other ascites: Secondary | ICD-10-CM

## 2019-10-27 LAB — PATHOLOGIST SMEAR REVIEW

## 2019-10-27 NOTE — Telephone Encounter (Signed)
Agreed, no further recommendations 

## 2019-10-27 NOTE — Telephone Encounter (Signed)
Randall Hiss, we are needing updated standing order on patient for paracentesis. Please advise if okay to do so under your name? If so please advise what labs/order for albumin is needed. Thanks

## 2019-10-27 NOTE — Telephone Encounter (Signed)
Spoke with EG. Given okay for standing order for PARA. Order albumin per protocol, cell count, gram stain, culture. Standing order faxed to CS

## 2019-10-29 DIAGNOSIS — N186 End stage renal disease: Secondary | ICD-10-CM | POA: Diagnosis not present

## 2019-10-29 DIAGNOSIS — M109 Gout, unspecified: Secondary | ICD-10-CM | POA: Diagnosis not present

## 2019-10-29 DIAGNOSIS — R52 Pain, unspecified: Secondary | ICD-10-CM | POA: Diagnosis not present

## 2019-10-29 DIAGNOSIS — E785 Hyperlipidemia, unspecified: Secondary | ICD-10-CM | POA: Diagnosis not present

## 2019-10-29 DIAGNOSIS — F330 Major depressive disorder, recurrent, mild: Secondary | ICD-10-CM | POA: Diagnosis not present

## 2019-10-29 DIAGNOSIS — I10 Essential (primary) hypertension: Secondary | ICD-10-CM | POA: Diagnosis not present

## 2019-10-31 LAB — CULTURE, BODY FLUID W GRAM STAIN -BOTTLE: Culture: NO GROWTH

## 2019-11-03 ENCOUNTER — Ambulatory Visit (HOSPITAL_COMMUNITY): Payer: Medicare Other

## 2019-11-03 ENCOUNTER — Encounter (HOSPITAL_COMMUNITY): Payer: Self-pay

## 2019-11-04 DIAGNOSIS — I2510 Atherosclerotic heart disease of native coronary artery without angina pectoris: Secondary | ICD-10-CM | POA: Diagnosis not present

## 2019-11-04 DIAGNOSIS — E1122 Type 2 diabetes mellitus with diabetic chronic kidney disease: Secondary | ICD-10-CM | POA: Diagnosis not present

## 2019-11-04 DIAGNOSIS — N186 End stage renal disease: Secondary | ICD-10-CM | POA: Diagnosis not present

## 2019-11-04 DIAGNOSIS — I120 Hypertensive chronic kidney disease with stage 5 chronic kidney disease or end stage renal disease: Secondary | ICD-10-CM | POA: Diagnosis not present

## 2019-11-19 DEATH — deceased
